# Patient Record
Sex: Male | Born: 1960
Health system: Southern US, Community
[De-identification: ages and names within clinical notes are randomized; demographics above are authoritative.]

## PROBLEM LIST (undated history)

## (undated) ENCOUNTER — Emergency Department (HOSPITAL_COMMUNITY): Disposition: A | Discharge status: Home / Self Care | Payer: No Typology Code available for payment source

## (undated) DIAGNOSIS — I078 Other rheumatic tricuspid valve diseases: Secondary | ICD-10-CM

## (undated) DIAGNOSIS — Z9289 Personal history of other medical treatment: Secondary | ICD-10-CM

## (undated) DIAGNOSIS — M109 Gout, unspecified: Secondary | ICD-10-CM

## (undated) DIAGNOSIS — G4730 Sleep apnea, unspecified: Secondary | ICD-10-CM

## (undated) DIAGNOSIS — I340 Nonrheumatic mitral (valve) insufficiency: Secondary | ICD-10-CM

## (undated) DIAGNOSIS — I5042 Chronic combined systolic (congestive) and diastolic (congestive) heart failure: Secondary | ICD-10-CM

## (undated) DIAGNOSIS — I420 Dilated cardiomyopathy: Secondary | ICD-10-CM

## (undated) DIAGNOSIS — I10 Essential (primary) hypertension: Secondary | ICD-10-CM

## (undated) DIAGNOSIS — I493 Ventricular premature depolarization: Secondary | ICD-10-CM

## (undated) DIAGNOSIS — M1990 Unspecified osteoarthritis, unspecified site: Secondary | ICD-10-CM

## (undated) HISTORY — DX: Dilated cardiomyopathy: I42.0

## (undated) HISTORY — DX: Chronic combined systolic (congestive) and diastolic (congestive) heart failure: I50.42

## (undated) HISTORY — DX: Sleep apnea, unspecified: G47.30

## (undated) HISTORY — DX: Essential (primary) hypertension: I10

## (undated) HISTORY — DX: Ventricular premature depolarization: I49.3

## (undated) HISTORY — PX: CARDIAC VALVE REPLACEMENT: SHX585

## (undated) HISTORY — PX: ABDOMINAL SURGERY: SHX537

## (undated) HISTORY — DX: Personal history of other medical treatment: Z92.89

## (undated) HISTORY — PX: CARDIAC CATHETERIZATION: SHX172

---

## 1998-05-16 ENCOUNTER — Emergency Department (HOSPITAL_COMMUNITY): Admission: EM | Admit: 1998-05-16 | Discharge: 1998-05-16 | Payer: Self-pay | Admitting: Emergency Medicine

## 1999-01-26 ENCOUNTER — Emergency Department (HOSPITAL_COMMUNITY): Admission: EM | Admit: 1999-01-26 | Discharge: 1999-01-26 | Payer: Self-pay | Admitting: Internal Medicine

## 2004-02-10 ENCOUNTER — Emergency Department (HOSPITAL_COMMUNITY): Admission: EM | Admit: 2004-02-10 | Discharge: 2004-02-10 | Payer: Self-pay | Admitting: Emergency Medicine

## 2004-07-25 ENCOUNTER — Emergency Department (HOSPITAL_COMMUNITY): Admission: EM | Admit: 2004-07-25 | Discharge: 2004-07-25 | Payer: Self-pay | Admitting: Emergency Medicine

## 2004-08-08 ENCOUNTER — Ambulatory Visit: Payer: Self-pay | Admitting: Family Medicine

## 2004-12-10 ENCOUNTER — Ambulatory Visit: Payer: Self-pay | Admitting: Family Medicine

## 2005-10-27 ENCOUNTER — Emergency Department (HOSPITAL_COMMUNITY): Admission: EM | Admit: 2005-10-27 | Discharge: 2005-10-27 | Payer: Self-pay | Admitting: Family Medicine

## 2007-11-13 ENCOUNTER — Ambulatory Visit: Payer: Self-pay | Admitting: Surgery

## 2007-11-13 ENCOUNTER — Encounter (INDEPENDENT_AMBULATORY_CARE_PROVIDER_SITE_OTHER): Payer: Self-pay | Admitting: Family Medicine

## 2007-11-13 ENCOUNTER — Ambulatory Visit (HOSPITAL_COMMUNITY): Admission: RE | Admit: 2007-11-13 | Discharge: 2007-11-13 | Payer: Self-pay | Admitting: Internal Medicine

## 2007-11-13 ENCOUNTER — Ambulatory Visit: Payer: Self-pay | Admitting: *Deleted

## 2007-11-13 ENCOUNTER — Ambulatory Visit: Payer: Self-pay | Admitting: Internal Medicine

## 2007-11-13 ENCOUNTER — Encounter: Payer: Self-pay | Admitting: Family Medicine

## 2007-11-13 LAB — CONVERTED CEMR LAB
ALT: 22 units/L (ref 0–53)
AST: 20 units/L (ref 0–37)
Albumin: 4.5 g/dL (ref 3.5–5.2)
Alkaline Phosphatase: 101 units/L (ref 39–117)
BUN: 18 mg/dL (ref 6–23)
Basophils Absolute: 0 10*3/uL (ref 0.0–0.1)
Basophils Relative: 0 % (ref 0–1)
CO2: 20 meq/L (ref 19–32)
Calcium: 8.8 mg/dL (ref 8.4–10.5)
Chloride: 104 meq/L (ref 96–112)
Cholesterol: 164 mg/dL (ref 0–200)
Creatinine, Ser: 1.19 mg/dL (ref 0.40–1.50)
Eosinophils Absolute: 0.4 10*3/uL (ref 0.0–0.7)
Eosinophils Relative: 9 % — ABNORMAL HIGH (ref 0–5)
Glucose, Bld: 73 mg/dL (ref 70–99)
HCT: 43.2 % (ref 39.0–52.0)
HDL: 50 mg/dL (ref >39)
Hemoglobin: 14.3 g/dL (ref 13.0–17.0)
LDL Cholesterol: 95 mg/dL (ref 0–99)
Lymphocytes Relative: 35 % (ref 12–46)
Lymphs Abs: 1.7 10*3/uL (ref 0.7–4.0)
MCHC: 33.1 g/dL (ref 30.0–36.0)
MCV: 93.1 fL (ref 78.0–100.0)
Monocytes Absolute: 0.4 10*3/uL (ref 0.1–1.0)
Monocytes Relative: 8 % (ref 3–12)
Neutro Abs: 2.4 10*3/uL (ref 1.7–7.7)
Neutrophils Relative %: 49 % (ref 43–77)
Platelets: 186 10*3/uL (ref 150–400)
Potassium: 4.1 meq/L (ref 3.5–5.3)
RBC: 4.64 M/uL (ref 4.22–5.81)
RDW: 13.7 % (ref 11.5–15.5)
Sodium: 141 meq/L (ref 135–145)
Total Bilirubin: 0.7 mg/dL (ref 0.3–1.2)
Total CHOL/HDL Ratio: 3.3
Total Protein: 7.5 g/dL (ref 6.0–8.3)
Triglycerides: 96 mg/dL (ref <150)
VLDL: 19 mg/dL (ref 0–40)
WBC: 5 10*3/uL (ref 4.0–10.5)

## 2007-11-17 ENCOUNTER — Ambulatory Visit: Payer: Self-pay | Admitting: Internal Medicine

## 2008-01-19 ENCOUNTER — Ambulatory Visit: Payer: Self-pay | Admitting: Internal Medicine

## 2008-01-28 ENCOUNTER — Ambulatory Visit: Payer: Self-pay | Admitting: Internal Medicine

## 2008-06-21 ENCOUNTER — Ambulatory Visit: Payer: Self-pay | Admitting: Internal Medicine

## 2008-06-21 LAB — CONVERTED CEMR LAB
ALT: 25 units/L (ref 0–53)
AST: 19 units/L (ref 0–37)
Albumin: 4.3 g/dL (ref 3.5–5.2)
Alkaline Phosphatase: 61 units/L (ref 39–117)
BUN: 12 mg/dL (ref 6–23)
Basophils Absolute: 0 10*3/uL (ref 0.0–0.1)
Basophils Relative: 0 % (ref 0–1)
CO2: 23 meq/L (ref 19–32)
Calcium: 9.5 mg/dL (ref 8.4–10.5)
Chloride: 102 meq/L (ref 96–112)
Creatinine, Ser: 1.2 mg/dL (ref 0.40–1.50)
Eosinophils Absolute: 0.2 10*3/uL (ref 0.0–0.7)
Eosinophils Relative: 5 % (ref 0–5)
Glucose, Bld: 78 mg/dL (ref 70–99)
HCT: 44.2 % (ref 39.0–52.0)
Hemoglobin: 15.1 g/dL (ref 13.0–17.0)
Lymphocytes Relative: 41 % (ref 12–46)
Lymphs Abs: 1.8 10*3/uL (ref 0.7–4.0)
MCHC: 34.2 g/dL (ref 30.0–36.0)
MCV: 93.2 fL (ref 78.0–100.0)
Microalb, Ur: 1.81 mg/dL (ref 0.00–1.89)
Monocytes Absolute: 0.5 10*3/uL (ref 0.1–1.0)
Monocytes Relative: 12 % (ref 3–12)
Neutro Abs: 1.9 10*3/uL (ref 1.7–7.7)
Neutrophils Relative %: 42 % — ABNORMAL LOW (ref 43–77)
PSA: 2.84 ng/mL (ref 0.10–4.00)
Platelets: 215 10*3/uL (ref 150–400)
Potassium: 4.8 meq/L (ref 3.5–5.3)
RBC: 4.74 M/uL (ref 4.22–5.81)
RDW: 13.3 % (ref 11.5–15.5)
Sodium: 139 meq/L (ref 135–145)
TSH: 2.712 microintl units/mL (ref 0.350–4.50)
Total Bilirubin: 0.5 mg/dL (ref 0.3–1.2)
Total Protein: 7.3 g/dL (ref 6.0–8.3)
WBC: 4.5 10*3/uL (ref 4.0–10.5)

## 2008-07-08 ENCOUNTER — Ambulatory Visit: Payer: Self-pay | Admitting: Internal Medicine

## 2008-07-22 ENCOUNTER — Ambulatory Visit: Payer: Self-pay | Admitting: Internal Medicine

## 2009-03-07 ENCOUNTER — Ambulatory Visit: Payer: Self-pay | Admitting: Internal Medicine

## 2009-06-06 ENCOUNTER — Ambulatory Visit: Payer: Self-pay | Admitting: Internal Medicine

## 2009-06-06 ENCOUNTER — Encounter (INDEPENDENT_AMBULATORY_CARE_PROVIDER_SITE_OTHER): Payer: Self-pay | Admitting: Adult Health

## 2009-06-06 LAB — CONVERTED CEMR LAB
ALT: 24 units/L (ref 0–53)
AST: 23 units/L (ref 0–37)
Albumin: 4.8 g/dL (ref 3.5–5.2)
Alkaline Phosphatase: 54 units/L (ref 39–117)
BUN: 18 mg/dL (ref 6–23)
Basophils Absolute: 0 10*3/uL (ref 0.0–0.1)
Basophils Relative: 0 % (ref 0–1)
CO2: 24 meq/L (ref 19–32)
Calcium: 9.8 mg/dL (ref 8.4–10.5)
Chloride: 102 meq/L (ref 96–112)
Cholesterol: 193 mg/dL (ref 0–200)
Creatinine, Ser: 1.35 mg/dL (ref 0.40–1.50)
Eosinophils Absolute: 0.3 10*3/uL (ref 0.0–0.7)
Eosinophils Relative: 6 % — ABNORMAL HIGH (ref 0–5)
Glucose, Bld: 88 mg/dL (ref 70–99)
HCT: 44.3 % (ref 39.0–52.0)
HDL: 55 mg/dL (ref >39)
Hemoglobin: 15.2 g/dL (ref 13.0–17.0)
LDL Cholesterol: 91 mg/dL (ref 0–99)
Lymphocytes Relative: 32 % (ref 12–46)
Lymphs Abs: 1.6 10*3/uL (ref 0.7–4.0)
MCHC: 34.3 g/dL (ref 30.0–36.0)
MCV: 91.3 fL (ref 78.0–100.0)
Monocytes Absolute: 0.5 10*3/uL (ref 0.1–1.0)
Monocytes Relative: 11 % (ref 3–12)
Neutro Abs: 2.4 10*3/uL (ref 1.7–7.7)
Neutrophils Relative %: 50 % (ref 43–77)
Platelets: 200 10*3/uL (ref 150–400)
Potassium: 4.1 meq/L (ref 3.5–5.3)
Pro B Natriuretic peptide (BNP): 37.6 pg/mL (ref 0.0–100.0)
RBC: 4.85 M/uL (ref 4.22–5.81)
RDW: 12.6 % (ref 11.5–15.5)
Sodium: 140 meq/L (ref 135–145)
Total Bilirubin: 0.6 mg/dL (ref 0.3–1.2)
Total CHOL/HDL Ratio: 3.5
Total Protein: 7.7 g/dL (ref 6.0–8.3)
Triglycerides: 237 mg/dL — ABNORMAL HIGH (ref <150)
VLDL: 47 mg/dL — ABNORMAL HIGH (ref 0–40)
WBC: 4.9 10*3/uL (ref 4.0–10.5)

## 2009-06-15 ENCOUNTER — Ambulatory Visit: Payer: Self-pay | Admitting: Internal Medicine

## 2009-09-28 ENCOUNTER — Emergency Department (HOSPITAL_COMMUNITY): Admission: EM | Admit: 2009-09-28 | Discharge: 2009-09-28 | Payer: Self-pay | Admitting: Emergency Medicine

## 2009-09-28 ENCOUNTER — Ambulatory Visit: Payer: Self-pay | Admitting: Internal Medicine

## 2009-09-28 ENCOUNTER — Encounter (INDEPENDENT_AMBULATORY_CARE_PROVIDER_SITE_OTHER): Payer: Self-pay | Admitting: Adult Health

## 2009-09-28 LAB — CONVERTED CEMR LAB: Microalb, Ur: 9.2 mg/dL — ABNORMAL HIGH (ref 0.00–1.89)

## 2009-10-19 ENCOUNTER — Ambulatory Visit: Payer: Self-pay | Admitting: Internal Medicine

## 2009-11-17 ENCOUNTER — Ambulatory Visit: Payer: Self-pay | Admitting: Internal Medicine

## 2010-12-02 LAB — DIFFERENTIAL
Basophils Absolute: 0 10*3/uL (ref 0.0–0.1)
Basophils Relative: 0 % (ref 0–1)
Eosinophils Absolute: 0.2 10*3/uL (ref 0.0–0.7)
Eosinophils Relative: 5 % (ref 0–5)
Lymphocytes Relative: 36 % (ref 12–46)
Lymphs Abs: 1.1 10*3/uL (ref 0.7–4.0)
Monocytes Absolute: 0.4 10*3/uL (ref 0.1–1.0)
Monocytes Relative: 13 % — ABNORMAL HIGH (ref 3–12)
Neutro Abs: 1.4 10*3/uL — ABNORMAL LOW (ref 1.7–7.7)
Neutrophils Relative %: 46 % (ref 43–77)

## 2010-12-02 LAB — BASIC METABOLIC PANEL
BUN: 13 mg/dL (ref 6–23)
CO2: 27 mEq/L (ref 19–32)
Calcium: 8.7 mg/dL (ref 8.4–10.5)
Chloride: 99 mEq/L (ref 96–112)
Creatinine, Ser: 1.25 mg/dL (ref 0.4–1.5)
GFR calc Af Amer: >60 mL/min (ref >60)
GFR calc non Af Amer: >60 mL/min (ref >60)
Glucose, Bld: 96 mg/dL (ref 70–99)
Potassium: 3.4 mEq/L — ABNORMAL LOW (ref 3.5–5.1)
Sodium: 138 mEq/L (ref 135–145)

## 2010-12-02 LAB — CBC
HCT: 42.9 % (ref 39.0–52.0)
Hemoglobin: 15.2 g/dL (ref 13.0–17.0)
MCHC: 35.5 g/dL (ref 30.0–36.0)
MCV: 94.3 fL (ref 78.0–100.0)
Platelets: 129 10*3/uL — ABNORMAL LOW (ref 150–400)
RBC: 4.55 MIL/uL (ref 4.22–5.81)
RDW: 12.7 % (ref 11.5–15.5)
WBC: 3.1 10*3/uL — ABNORMAL LOW (ref 4.0–10.5)

## 2011-04-17 ENCOUNTER — Other Ambulatory Visit (HOSPITAL_COMMUNITY): Payer: Self-pay | Admitting: Family Medicine

## 2011-04-17 DIAGNOSIS — R197 Diarrhea, unspecified: Secondary | ICD-10-CM

## 2011-04-18 ENCOUNTER — Ambulatory Visit (HOSPITAL_COMMUNITY)
Admission: RE | Admit: 2011-04-18 | Discharge: 2011-04-18 | Disposition: A | Discharge status: Home / Self Care | Payer: Self-pay | Source: Ambulatory Visit | Attending: Family Medicine | Admitting: Family Medicine

## 2011-04-18 DIAGNOSIS — R109 Unspecified abdominal pain: Secondary | ICD-10-CM | POA: Insufficient documentation

## 2011-04-18 DIAGNOSIS — K5730 Diverticulosis of large intestine without perforation or abscess without bleeding: Secondary | ICD-10-CM | POA: Insufficient documentation

## 2011-04-18 DIAGNOSIS — R197 Diarrhea, unspecified: Secondary | ICD-10-CM | POA: Insufficient documentation

## 2011-04-18 DIAGNOSIS — R1110 Vomiting, unspecified: Secondary | ICD-10-CM | POA: Insufficient documentation

## 2011-04-18 MED ORDER — IOHEXOL 300 MG/ML  SOLN
100.0000 mL | Freq: Once | INTRAMUSCULAR | Status: AC | PRN
  Administered 2011-04-18: 100 mL via INTRAVENOUS

## 2014-01-19 ENCOUNTER — Encounter (HOSPITAL_COMMUNITY): Payer: Self-pay | Admitting: Emergency Medicine

## 2014-01-19 ENCOUNTER — Emergency Department (HOSPITAL_COMMUNITY): Payer: No Typology Code available for payment source

## 2014-01-19 ENCOUNTER — Inpatient Hospital Stay (HOSPITAL_COMMUNITY)
Admission: EM | Admit: 2014-01-19 | Discharge: 2014-01-25 | DRG: 292 | Disposition: A | Discharge status: Home / Self Care | Payer: No Typology Code available for payment source | Attending: Internal Medicine | Admitting: Internal Medicine

## 2014-01-19 DIAGNOSIS — I5041 Acute combined systolic (congestive) and diastolic (congestive) heart failure: Secondary | ICD-10-CM

## 2014-01-19 DIAGNOSIS — R0609 Other forms of dyspnea: Secondary | ICD-10-CM | POA: Diagnosis present

## 2014-01-19 DIAGNOSIS — I340 Nonrheumatic mitral (valve) insufficiency: Secondary | ICD-10-CM | POA: Diagnosis present

## 2014-01-19 DIAGNOSIS — I078 Other rheumatic tricuspid valve diseases: Secondary | ICD-10-CM | POA: Diagnosis present

## 2014-01-19 DIAGNOSIS — R351 Nocturia: Secondary | ICD-10-CM | POA: Diagnosis present

## 2014-01-19 DIAGNOSIS — I5043 Acute on chronic combined systolic (congestive) and diastolic (congestive) heart failure: Secondary | ICD-10-CM | POA: Diagnosis present

## 2014-01-19 DIAGNOSIS — I110 Hypertensive heart disease with heart failure: Principal | ICD-10-CM | POA: Diagnosis present

## 2014-01-19 DIAGNOSIS — I059 Rheumatic mitral valve disease, unspecified: Secondary | ICD-10-CM | POA: Diagnosis present

## 2014-01-19 DIAGNOSIS — I10 Essential (primary) hypertension: Secondary | ICD-10-CM | POA: Diagnosis present

## 2014-01-19 DIAGNOSIS — R0600 Dyspnea, unspecified: Secondary | ICD-10-CM | POA: Diagnosis present

## 2014-01-19 DIAGNOSIS — I517 Cardiomegaly: Secondary | ICD-10-CM

## 2014-01-19 DIAGNOSIS — Z79899 Other long term (current) drug therapy: Secondary | ICD-10-CM

## 2014-01-19 DIAGNOSIS — I43 Cardiomyopathy in diseases classified elsewhere: Secondary | ICD-10-CM | POA: Diagnosis present

## 2014-01-19 DIAGNOSIS — R0989 Other specified symptoms and signs involving the circulatory and respiratory systems: Secondary | ICD-10-CM | POA: Diagnosis present

## 2014-01-19 DIAGNOSIS — J811 Chronic pulmonary edema: Secondary | ICD-10-CM

## 2014-01-19 DIAGNOSIS — N179 Acute kidney failure, unspecified: Secondary | ICD-10-CM | POA: Diagnosis present

## 2014-01-19 DIAGNOSIS — R519 Headache, unspecified: Secondary | ICD-10-CM | POA: Diagnosis present

## 2014-01-19 DIAGNOSIS — H538 Other visual disturbances: Secondary | ICD-10-CM | POA: Diagnosis present

## 2014-01-19 DIAGNOSIS — I509 Heart failure, unspecified: Secondary | ICD-10-CM | POA: Diagnosis present

## 2014-01-19 DIAGNOSIS — I160 Hypertensive urgency: Secondary | ICD-10-CM

## 2014-01-19 DIAGNOSIS — R9431 Abnormal electrocardiogram [ECG] [EKG]: Secondary | ICD-10-CM | POA: Diagnosis present

## 2014-01-19 DIAGNOSIS — R51 Headache: Secondary | ICD-10-CM

## 2014-01-19 HISTORY — DX: Nonrheumatic mitral (valve) insufficiency: I34.0

## 2014-01-19 HISTORY — DX: Other rheumatic tricuspid valve diseases: I07.8

## 2014-01-19 HISTORY — DX: Essential (primary) hypertension: I10

## 2014-01-19 LAB — URINALYSIS, ROUTINE W REFLEX MICROSCOPIC
Bilirubin Urine: NEGATIVE
Glucose, UA: NEGATIVE mg/dL
Hgb urine dipstick: NEGATIVE
Ketones, ur: NEGATIVE mg/dL
Leukocytes, UA: NEGATIVE
Nitrite: NEGATIVE
Protein, ur: 30 mg/dL — AB
Specific Gravity, Urine: 1.021 (ref 1.005–1.030)
Urobilinogen, UA: 0.2 mg/dL (ref 0.0–1.0)
pH: 5.5 (ref 5.0–8.0)

## 2014-01-19 LAB — URINE MICROSCOPIC-ADD ON

## 2014-01-19 LAB — CBC
HEMATOCRIT: 42.5 % (ref 39.0–52.0)
Hemoglobin: 14.9 g/dL (ref 13.0–17.0)
MCH: 31.3 pg (ref 26.0–34.0)
MCHC: 35.1 g/dL (ref 30.0–36.0)
MCV: 89.3 fL (ref 78.0–100.0)
Platelets: 174 10*3/uL (ref 150–400)
RBC: 4.76 MIL/uL (ref 4.22–5.81)
RDW: 13.6 % (ref 11.5–15.5)
WBC: 4.4 10*3/uL (ref 4.0–10.5)

## 2014-01-19 LAB — TROPONIN I: Troponin I: <0.3 ng/mL (ref <0.30)

## 2014-01-19 LAB — BASIC METABOLIC PANEL
BUN: 17 mg/dL (ref 6–23)
CALCIUM: 9.2 mg/dL (ref 8.4–10.5)
CHLORIDE: 104 meq/L (ref 96–112)
CO2: 23 meq/L (ref 19–32)
CREATININE: 1.34 mg/dL (ref 0.50–1.35)
GFR calc Af Amer: 70 mL/min — ABNORMAL LOW (ref >90)
GFR calc non Af Amer: 60 mL/min — ABNORMAL LOW (ref >90)
Glucose, Bld: 76 mg/dL (ref 70–99)
Potassium: 4.1 mEq/L (ref 3.7–5.3)
Sodium: 142 mEq/L (ref 137–147)

## 2014-01-19 LAB — I-STAT CHEM 8, ED
BUN: 16 mg/dL (ref 6–23)
Calcium, Ion: 1.19 mmol/L (ref 1.12–1.23)
Chloride: 103 mEq/L (ref 96–112)
Creatinine, Ser: 1.5 mg/dL — ABNORMAL HIGH (ref 0.50–1.35)
Glucose, Bld: 84 mg/dL (ref 70–99)
HCT: 44 % (ref 39.0–52.0)
Hemoglobin: 15 g/dL (ref 13.0–17.0)
Potassium: 3.8 mEq/L (ref 3.7–5.3)
Sodium: 142 mEq/L (ref 137–147)
TCO2: 23 mmol/L (ref 0–100)

## 2014-01-19 LAB — PRO B NATRIURETIC PEPTIDE: Pro B Natriuretic peptide (BNP): 2268 pg/mL — ABNORMAL HIGH (ref 0–125)

## 2014-01-19 MED ORDER — ACETAMINOPHEN 650 MG RE SUPP: 650.0000 mg | Freq: Four times a day (QID) | RECTAL | Status: DC | PRN

## 2014-01-19 MED ORDER — FUROSEMIDE 10 MG/ML IJ SOLN
40.0000 mg | Freq: Once | INTRAMUSCULAR | Status: AC
  Administered 2014-01-19: 40 mg via INTRAVENOUS
  Filled 2014-01-19: qty 4

## 2014-01-19 MED ORDER — ASPIRIN EC 81 MG PO TBEC
81.0000 mg | DELAYED_RELEASE_TABLET | Freq: Every day | ORAL | Status: DC
  Administered 2014-01-20 – 2014-01-25 (×6): 81 mg via ORAL
  Filled 2014-01-19 (×6): qty 1

## 2014-01-19 MED ORDER — ONDANSETRON HCL 4 MG PO TABS: 4.0000 mg | ORAL_TABLET | Freq: Four times a day (QID) | ORAL | Status: DC | PRN

## 2014-01-19 MED ORDER — LABETALOL HCL 5 MG/ML IV SOLN
20.0000 mg | Freq: Once | INTRAVENOUS | Status: AC
  Administered 2014-01-19: 20 mg via INTRAVENOUS
  Filled 2014-01-19: qty 4

## 2014-01-19 MED ORDER — ONDANSETRON HCL 4 MG/2ML IJ SOLN: 4.0000 mg | Freq: Four times a day (QID) | INTRAMUSCULAR | Status: DC | PRN

## 2014-01-19 MED ORDER — HYDRALAZINE HCL 20 MG/ML IJ SOLN
10.0000 mg | INTRAMUSCULAR | Status: DC | PRN
  Administered 2014-01-20 – 2014-01-25 (×3): 10 mg via INTRAVENOUS
  Filled 2014-01-19 (×2): qty 1
  Filled 2014-01-19: qty 0.5

## 2014-01-19 MED ORDER — HYDRALAZINE HCL 25 MG PO TABS
25.0000 mg | ORAL_TABLET | Freq: Four times a day (QID) | ORAL | Status: DC
  Administered 2014-01-19: 25 mg via ORAL
  Filled 2014-01-19 (×6): qty 1

## 2014-01-19 MED ORDER — AMLODIPINE BESYLATE 10 MG PO TABS
10.0000 mg | ORAL_TABLET | Freq: Every day | ORAL | Status: DC
  Administered 2014-01-19 – 2014-01-25 (×7): 10 mg via ORAL
  Filled 2014-01-19 (×8): qty 1

## 2014-01-19 MED ORDER — FUROSEMIDE 40 MG PO TABS
40.0000 mg | ORAL_TABLET | Freq: Every morning | ORAL | Status: DC
  Administered 2014-01-20 – 2014-01-25 (×6): 40 mg via ORAL
  Filled 2014-01-19 (×7): qty 1

## 2014-01-19 MED ORDER — HEPARIN SODIUM (PORCINE) 5000 UNIT/ML IJ SOLN
5000.0000 [IU] | Freq: Three times a day (TID) | INTRAMUSCULAR | Status: DC
  Administered 2014-01-19 – 2014-01-25 (×17): 5000 [IU] via SUBCUTANEOUS
  Filled 2014-01-19 (×21): qty 1

## 2014-01-19 MED ORDER — METOPROLOL TARTRATE 100 MG PO TABS
100.0000 mg | ORAL_TABLET | Freq: Two times a day (BID) | ORAL | Status: DC
  Administered 2014-01-19 – 2014-01-21 (×5): 100 mg via ORAL
  Filled 2014-01-19: qty 4
  Filled 2014-01-19 (×6): qty 1

## 2014-01-19 MED ORDER — SODIUM CHLORIDE 0.9 % IJ SOLN
3.0000 mL | Freq: Two times a day (BID) | INTRAMUSCULAR | Status: DC
  Administered 2014-01-19 – 2014-01-25 (×7): 3 mL via INTRAVENOUS

## 2014-01-19 MED ORDER — NITROGLYCERIN 2 % TD OINT
0.5000 [in_us] | TOPICAL_OINTMENT | Freq: Four times a day (QID) | TRANSDERMAL | Status: DC
  Administered 2014-01-19: 0.5 [in_us] via TOPICAL
  Filled 2014-01-19: qty 30

## 2014-01-19 MED ORDER — ACETAMINOPHEN 325 MG PO TABS
650.0000 mg | ORAL_TABLET | Freq: Four times a day (QID) | ORAL | Status: DC | PRN
  Administered 2014-01-20 – 2014-01-22 (×3): 650 mg via ORAL
  Filled 2014-01-19 (×3): qty 2

## 2014-01-19 MED ORDER — BUTALBITAL-APAP-CAFFEINE 50-325-40 MG PO TABS
1.0000 | ORAL_TABLET | ORAL | Status: DC | PRN
  Administered 2014-01-20 – 2014-01-21 (×4): 1 via ORAL
  Filled 2014-01-19 (×4): qty 1

## 2014-01-19 NOTE — ED Notes (Signed)
Pt states that he just used the restroom while he was in the waiting room

## 2014-01-19 NOTE — ED Notes (Signed)
Pt from [facility] via [facility]. He went there to get his BP checked.  He has been off his BP meds x 2 months, BP per EMS was 210/140. He rates his slight headache at 3/10. [facility] gave 100mg  PO of Labetalol and 0.2mg  Clonidine.

## 2014-01-19 NOTE — H&P (Signed)
[facility] History and Physical  Patient: Daniel Joseph  SWN:[id number]  DOB: 04-13-1961  DOS: the patient was seen and examined on 01/19/2014 PCP: No primary provider on file.  Chief Complaint: headache and blurred vision  HPI: Daniel Joseph is a 53 y.o. male with Past medical history of hypertension. The patient presented with complaints of shortness of breath that has been ongoing since last 2 weeks. He initially described her shortness of breath as on exertion. Since last one week he has been having complaints of orthopnea. Since last 3 days he has been having periods of PND. She denies any fever, chills, cough, chest pain, palpitation. He complains of blurring of the vision which has been occurring on and off throughout the day this last few days. At present it is resolved. He also denies any complaint no focal neurological deficit. He mentions he has history of hypertension and has been on medications but due to his insurance issues he had an out of his medications 2 months ago and has not been able to take any of them. He denies any alcohol abuse smoking or drug abuse. He mentions he watches his salt in diet. He mentions he claims 1-2 beers on a daily basis.  The patient is coming from home. And at his baseline independent for most of his ADL.  Review of Systems: as mentioned in the history of present illness.  A Comprehensive review of the other systems is negative.  Past Medical History  Diagnosis Date  . Hypertension    Past Surgical History  Procedure Laterality Date  . Abdominal surgery  at birth   Social History:  reports that he has never smoked. He has never used smokeless tobacco. He reports that he drinks alcohol. He reports that he does not use illicit drugs.  No Known Allergies  History reviewed. No pertinent family history.  Prior to Admission medications   Medication Sig Start Date End Date Taking? Authorizing Provider  amLODipine (NORVASC) 10 MG  tablet Take 10 mg by mouth daily.   Yes Historical Provider, MD  aspirin 325 MG tablet Take 325 mg by mouth daily.   Yes Historical Provider, MD  benazepril (LOTENSIN) 40 MG tablet Take 40 mg by mouth daily.   Yes Historical Provider, MD  cloNIDine (CATAPRES) 0.1 MG tablet Take 0.1 mg by mouth 2 (two) times daily.   Yes Historical Provider, MD  furosemide (LASIX) 20 MG tablet Take 20 mg by mouth every morning.   Yes Historical Provider, MD  metoprolol (LOPRESSOR) 50 MG tablet Take 100 mg by mouth 2 (two) times daily.   Yes Historical Provider, MD  potassium chloride (K-DUR,KLOR-CON) 10 MEQ tablet Take 10 mEq by mouth daily.   Yes Historical Provider, MD    Physical Exam: Filed Vitals:   01/19/14 1543 01/19/14 1841  BP: 188/138 180/130  Pulse: 92 76  Temp: 98 F (36.7 C)   TempSrc: Oral   Resp: 16 22  SpO2: 98% 98%    General: Alert, Awake and Oriented to Time, Place and Person. Appear in mild distress Eyes: PERRL ENT: Oral Mucosa clear moist. Neck: no JVD Cardiovascular: S1 and S2 Present, no Murmur, Peripheral Pulses Present Respiratory: Bilateral Air entry equal and Decreased, Clear to Auscultation,  no Crackles,no wheezes Abdomen: Bowel Sound Present, Soft and Non tender Skin: no Rash Extremities: trace Pedal edema, no calf tenderness Neurologic: Grossly no focal neuro deficit. Mental status AAOx3, speech normal, attention normal, Cranial Nerves PERRL, EOM normal and present,  facial sensation to light touch present: , Motor strength bilateral equal strength 5/5, Sensation present to light touch, reflexes present knee and biceps, babinski negative, Cerebellar test normal finger nose finger.  Labs on Admission:  CBC:  Recent Labs Lab 01/19/14 1716 01/19/14 2045  WBC  --  4.4  HGB 15.0 14.9  HCT 44.0 42.5  MCV  --  89.3  PLT  --  174    CMP     Component Value Date/Time   NA 142 01/19/2014 2045   K 4.1 01/19/2014 2045   CL 104 01/19/2014 2045   CO2 23 01/19/2014 2045    GLUCOSE 76 01/19/2014 2045   BUN 17 01/19/2014 2045   CREATININE 1.34 01/19/2014 2045   CALCIUM 9.2 01/19/2014 2045   PROT 7.7 06/06/2009 2116   ALBUMIN 4.8 06/06/2009 2116   AST 23 06/06/2009 2116   ALT 24 06/06/2009 2116   ALKPHOS 54 06/06/2009 2116   BILITOT 0.6 06/06/2009 2116   GFRNONAA 60* 01/19/2014 2045   GFRAA 70* 01/19/2014 2045    No results found for this basename: LIPASE, AMYLASE,  in the last 168 hours No results found for this basename: AMMONIA,  in the last 168 hours   Recent Labs Lab 01/19/14 1904  TROPONINI <0.30   BNP (last 3 results)  Recent Labs  01/19/14 1904  PROBNP 2268.0*    Radiological Exams on Admission: Dg Chest 2 View  01/19/2014   CLINICAL DATA:  Cough.  EXAM: CHEST  2 VIEW  COMPARISON:  07/26/2011  FINDINGS: The heart is borderline enlarged which is a new finding. There is peribronchial thickening and increased interstitial markings. Findings could be secondary to interstitial edema or bronchitis. No pleural effusion or focal infiltrate. The bony thorax is intact.  IMPRESSION: Cardiac enlargement, new since prior chest x-ray. There is also peribronchial thickening and increased interstitial markings which could reflect edema or bronchitis. No focal infiltrates or pleural effusion.   Electronically Signed   By: Kalman Jewels M.D.   On: 01/19/2014 17:46    EKG: Independently reviewed. normal sinus rhythm, nonspecific ST and T waves changes.  Assessment/Plan Principal Problem:   Accelerated hypertension Active Problems:   PND (paroxysmal nocturnal dyspnea)   Nocturia   Blurred vision   Headache   T wave inversion in EKG   Cardiomegaly   1. Accelerated hypertension The patient is presenting with significantly elevated blood pressure. His proBNP is also elevated. Chest x-ray shows minimal cardiomegaly an EKG shows T wave inversions. Troponins are negative. With that the patient will be admitted to the hospital for further workup. He will be started on  amlodipine, Lopressor, hydralazine, nitroglycerin ointment, IV hydralazine as needed. I would avoid clonidine in his case due to possibility of abrupt withdrawal and abrupt rebound hypertension.  2. Possible diastolic dysfunction Patient has received IV Lasix I would continue him on Lasix tomorrow. Echocardiogram in the morning. Serial troponin  3. Blood vision Patient does not have any focal neurological deficit on examination at present continue to monitor.  DVT Prophylaxis: subcutaneous Heparin Nutrition: cardiac diet  Code Status: full  Disposition: Admitted to observation in telemetry unit.  Author: Berle Mull, MD [facility] Pager: [phone] 01/19/2014, 10:21 PM    If 7PM-7AM, please contact night-coverage [email or web address] Password [email or web address]

## 2014-01-19 NOTE — ED Provider Notes (Signed)
CSN: [id number]     Arrival date & time 01/19/14  1531 History   First MD Initiated Contact with Patient 01/19/14 1615     Chief Complaint  Patient presents with  . Hypertension     (Consider location/radiation/quality/duration/timing/severity/associated sxs/prior Treatment) HPI Pt presenting with c/o hypertension.  Pt states that he has been feeling short of breath over the past week.  He has not been taking his BP meds over the past 2 months.  [facility], was found to be hypertensive, he was given labetalol and clonodine po and referred to the ED for further management.  No chest pain. No leg swelling.  SOB worse with exertion.  Denies PND and orthopnea.  There are no other associated systemic symptoms, there are no other alleviating or modifying factors.   Past Medical History  Diagnosis Date  . Hypertension    Past Surgical History  Procedure Laterality Date  . Abdominal surgery  at birth   History reviewed. No pertinent family history. History  Substance Use Topics  . Smoking status: Never Smoker   . Smokeless tobacco: Never Used  . Alcohol Use: Yes     Comment: social    Review of Systems ROS reviewed and all otherwise negative except for mentioned in HPI    Allergies  Review of patient's allergies indicates no known allergies.  Home Medications   Prior to Admission medications   Medication Sig Start Date End Date Taking? Authorizing Provider  amLODipine (NORVASC) 10 MG tablet Take 10 mg by mouth daily.   Yes Historical Provider, MD  aspirin 325 MG tablet Take 325 mg by mouth daily.   Yes Historical Provider, MD  benazepril (LOTENSIN) 40 MG tablet Take 40 mg by mouth daily.   Yes Historical Provider, MD  cloNIDine (CATAPRES) 0.1 MG tablet Take 0.1 mg by mouth 2 (two) times daily.   Yes Historical Provider, MD  furosemide (LASIX) 20 MG tablet Take 20 mg by mouth every morning.   Yes Historical Provider, MD  metoprolol (LOPRESSOR) 50 MG tablet  Take 100 mg by mouth 2 (two) times daily.   Yes Historical Provider, MD  potassium chloride (K-DUR,KLOR-CON) 10 MEQ tablet Take 10 mEq by mouth daily.   Yes Historical Provider, MD   BP 164/120  Pulse 67  Temp(Src) 98 F (36.7 C) (Oral)  Resp 19  SpO2 97% Vitals reviewed Physical Exam Physical Examination: General appearance - alert, well appearing, and in no distress Mental status - alert, oriented to person, place, and time Eyes - no conjunctival injection, no scleral icterus Mouth - mucous membranes moist, pharynx normal without lesions Chest - clear to auscultation, no wheezes, rales or rhonchi, symmetric air entry, mild tachypnea, no increased respiratory effort Heart - normal rate, regular rhythm, normal S1, S2, no murmurs, rubs, clicks or gallops Abdomen - soft, nontender, nondistended, no masses or organomegaly Extremities - peripheral pulses normal, no pedal edema, no clubbing or cyanosis Skin - normal coloration and turgor, no rashes  ED Course  Procedures (including critical care time) Labs Review Labs Reviewed  URINALYSIS, ROUTINE W REFLEX MICROSCOPIC - Abnormal; Notable for the following:    Protein, ur 30 (*)    All other components within normal limits  PRO B NATRIURETIC PEPTIDE - Abnormal; Notable for the following:    Pro B Natriuretic peptide (BNP) 2268.0 (*)    All other components within normal limits  BASIC METABOLIC PANEL - Abnormal; Notable for the following:    GFR  calc non Af Amer 60 (*)    GFR calc Af Amer 70 (*)    All other components within normal limits  I-STAT CHEM 8, ED - Abnormal; Notable for the following:    Creatinine, Ser 1.50 (*)    All other components within normal limits  TROPONIN I  URINE MICROSCOPIC-ADD ON  CBC  COMPREHENSIVE METABOLIC PANEL  CBC  PROTIME-INR  TROPONIN I    Imaging Review Dg Chest 2 View  01/19/2014   CLINICAL DATA:  Cough.  EXAM: CHEST  2 VIEW  COMPARISON:  07/26/2011  FINDINGS: The heart is borderline  enlarged which is a new finding. There is peribronchial thickening and increased interstitial markings. Findings could be secondary to interstitial edema or bronchitis. No pleural effusion or focal infiltrate. The bony thorax is intact.  IMPRESSION: Cardiac enlargement, new since prior chest x-ray. There is also peribronchial thickening and increased interstitial markings which could reflect edema or bronchitis. No focal infiltrates or pleural effusion.   Electronically Signed   By: Kalman Jewels M.D.   On: 01/19/2014 17:46     EKG Interpretation   Date/Time:  Wednesday Jan 19 2014 16:36:45 EDT Ventricular Rate:  73 PR Interval:  164 QRS Duration: 99 QT Interval:  428 QTC Calculation: 472 R Axis:   -24 Text Interpretation:  Sinus rhythm Left atrial enlargement Left  ventricular hypertrophy Nonspecific T abnormalities, lateral leads  Baseline wander in lead(s) V5 Since previous tracing LVH more evident,  inverted t waves laterally Confirmed by Canary Brim  MD, Zasha Belleau [phone]) on  01/19/2014 10:43:51 PM      MDM   Final diagnoses:  Hypertensive urgency  Pulmonary edema    Pt presenting with hypertension, shortness of breath.  Workup reveals LVH on EKg, new finding of pulmonary edema and cardiomegaly on CXR.  BNP elevated at > 2000. Pt treated with labetalol and lasix in the ED.  D/w [facility] for admission, will need serial troponins and echo as well as diuresis and BP control.  Pt is agreeable with this plan.     Threasa Beards, MD 01/19/14 [phone]

## 2014-01-19 NOTE — Progress Notes (Signed)
  CARE MANAGEMENT ED NOTE 01/19/2014  Patient:  Daniel Joseph, Daniel Joseph   Account Number:  [id number]  Date Initiated:  01/19/2014  Documentation initiated by:  Livia Snellen  Subjective/Objective Assessment:   Patien tpresents to Ed with elvated blood pressure.     Subjective/Objective Assessment Detail:     Action/Plan:   Action/Plan Detail:   Anticipated DC Date:       Status Recommendation to Physician:   Result of Recommendation:    Other ED Services  Consult Working [place]  CM consult  Other  PCP issues    Choice offered to / List presented to:            Status of service:  Completed, signed off  ED Comments:   ED Comments Detail:  EDCM spoke to patient at bedside.  Patient confirms he has the orange card.  Patient was a patient at [facility] before it closed.  Patient is now being seen at [facility]. Patient's copay per visit is twenty dollars.  Patient reports that since he had his orange card rewed in March, the first available appointment that could be made for him was for today.  Patient is aware his copay for his generic medications is four dollars and patient is agreeable to this copay.  Patient reports he will get his prescriptions filled at [facility].  No further EDCM needs at this time.

## 2014-01-20 DIAGNOSIS — R51 Headache: Secondary | ICD-10-CM

## 2014-01-20 DIAGNOSIS — N179 Acute kidney failure, unspecified: Secondary | ICD-10-CM | POA: Diagnosis present

## 2014-01-20 LAB — COMPREHENSIVE METABOLIC PANEL
ALBUMIN: 3.7 g/dL (ref 3.5–5.2)
ALT: 17 U/L (ref 0–53)
AST: 16 U/L (ref 0–37)
Alkaline Phosphatase: 49 U/L (ref 39–117)
BUN: 18 mg/dL (ref 6–23)
CHLORIDE: 101 meq/L (ref 96–112)
CO2: 23 meq/L (ref 19–32)
CREATININE: 1.47 mg/dL — AB (ref 0.50–1.35)
Calcium: 9.2 mg/dL (ref 8.4–10.5)
GFR calc Af Amer: 63 mL/min — ABNORMAL LOW (ref >90)
GFR, EST NON AFRICAN AMERICAN: 54 mL/min — AB (ref >90)
Glucose, Bld: 105 mg/dL — ABNORMAL HIGH (ref 70–99)
Potassium: 3.7 mEq/L (ref 3.7–5.3)
SODIUM: 138 meq/L (ref 137–147)
Total Bilirubin: 1 mg/dL (ref 0.3–1.2)
Total Protein: 6.5 g/dL (ref 6.0–8.3)

## 2014-01-20 LAB — CBC
HCT: 40.4 % (ref 39.0–52.0)
Hemoglobin: 14.5 g/dL (ref 13.0–17.0)
MCH: 31.7 pg (ref 26.0–34.0)
MCHC: 35.9 g/dL (ref 30.0–36.0)
MCV: 88.4 fL (ref 78.0–100.0)
Platelets: 173 10*3/uL (ref 150–400)
RBC: 4.57 MIL/uL (ref 4.22–5.81)
RDW: 13.4 % (ref 11.5–15.5)
WBC: 4.4 10*3/uL (ref 4.0–10.5)

## 2014-01-20 LAB — TROPONIN I: Troponin I: <0.3 ng/mL (ref <0.30)

## 2014-01-20 LAB — PROTIME-INR
INR: 1.13 (ref 0.00–1.49)
PROTHROMBIN TIME: 14.3 s (ref 11.6–15.2)

## 2014-01-20 LAB — MRSA PCR SCREENING: MRSA BY PCR: NEGATIVE

## 2014-01-20 MED ORDER — NITROGLYCERIN IN D5W 200-5 MCG/ML-% IV SOLN
2.0000 ug/min | INTRAVENOUS | Status: DC
  Administered 2014-01-20: 10 ug/min via INTRAVENOUS
  Administered 2014-01-21: 30 ug/min via INTRAVENOUS
  Administered 2014-01-22: 25 ug/min via INTRAVENOUS
  Administered 2014-01-23: 20 ug/min via INTRAVENOUS
  Administered 2014-01-23: 15 ug/min via INTRAVENOUS
  Administered 2014-01-23: 10 ug/min via INTRAVENOUS
  Filled 2014-01-20 (×3): qty 250

## 2014-01-20 MED ORDER — HYDRALAZINE HCL 50 MG PO TABS
50.0000 mg | ORAL_TABLET | Freq: Four times a day (QID) | ORAL | Status: DC
  Administered 2014-01-20 – 2014-01-21 (×5): 50 mg via ORAL
  Filled 2014-01-20 (×9): qty 1

## 2014-01-20 NOTE — Progress Notes (Signed)
Echocardiogram 2D Echocardiogram has been performed.  Daniel Joseph 01/20/2014, 9:40 AM

## 2014-01-20 NOTE — Progress Notes (Signed)
Progress Note   Daniel Joseph HLK:[id number] DOB: 1961-02-21 DOA: 01/19/2014 PCP: No primary provider on file.   Brief Narrative:   Daniel Joseph is an 53 y.o. male with PMH of hypertension treated with 5 antihypertensive medications but who has been unable to obtain his medications for 2 months secondary to insurance issues, who was admitted 01/20/14 with a chief complaint of headache, dyspnea, blurred vision as well as orthopnea and paroxysmal nocturnal dyspnea. Upon initial valuation in the ER, he was found to have a pro BNP of 2268, a chest x-ray significant for cardiac enlargement with possible pulmonary edema, and EKG significant for nonspecific ST and T-wave changes, and a blood pressure of 188/138.  Assessment/Plan:   Principal Problem:   Accelerated hypertension with acute kidney injury  The patient was admitted to the SDU, and placed on Norvasc, Lasix, metoprolol, a nitroglycerin drip, and when necessary hydralazine.  Blood pressure still elevated.  Patient has evidence of end organ damage with acute kidney injury. Monitor renal function closely. Active Problems:   PND (paroxysmal nocturnal dyspnea)  Suspect CHF. Followup echocardiogram. Diurese as tolerated.   Blurred vision  Likely reflective of blood pressure elevation.   Headache  Likely reflective of accelerated hypertension. Treat symptomatically and continue efforts at lowering blood pressure.   T wave inversion in EKG  Likely from cardiac strain in the setting of accelerated hypertension.  Troponins negative x2.  Continue aspirin.   Cardiomegaly  Likely LV hypertrophy from uncontrolled hypertension.  2-D echocardiogram planned.   DVT Prophylaxis  Continue heparin.   Code Status: Full. Family Communication: No family present at the bedside. Disposition Plan: Home when stable.   IV Access:    Peripheral IV   Procedures:    2-D echocardiogram   Medical Consultants:     None.   Other Consultants:    None.   Anti-Infectives:    None.  Subjective:    Daniel Joseph says his headache has improved. Denies chest pain or tightness. No dyspnea. No nausea or vomiting. Blurry vision better.  Objective:    Filed Vitals:   01/20/14 0545 01/20/14 0554 01/20/14 0630 01/20/14 0645  BP: 173/113 171/112 173/113 165/104  Pulse:      Temp:      TempSrc:      Resp: 21 23 21 19   Height:      Weight:      SpO2: 97% 97% 95% 93%    Intake/Output Summary (Last 24 hours) at 01/20/14 0708 Last data filed at 01/20/14 0630  Gross per 24 hour  Intake    278 ml  Output    500 ml  Net   -222 ml    Exam: Gen:  NAD Cardiovascular:  RRR, +S4 gallop Respiratory:  Lungs CTAB Gastrointestinal:  Abdomen soft, NT/ND, + BS Extremities:  No C/E/C   Data Reviewed:    Labs: Basic Metabolic Panel:  Recent Labs Lab 01/19/14 1716 01/19/14 2045 01/20/14 0048  NA 142 142 138  K 3.8 4.1 3.7  CL 103 104 101  CO2  --  23 23  GLUCOSE 84 76 105*  BUN 16 17 18   CREATININE 1.50* 1.34 1.47*  CALCIUM  --  9.2 9.2   GFR Estimated Creatinine Clearance: 75.8 ml/min (by C-G formula based on Cr of 1.47). Liver Function Tests:  Recent Labs Lab 01/20/14 0048  AST 16  ALT 17  ALKPHOS 49  BILITOT 1.0  PROT 6.5  ALBUMIN 3.7  Coagulation profile  Recent Labs Lab 01/20/14 0048  INR 1.13    CBC:  Recent Labs Lab 01/19/14 1716 01/19/14 2045 01/20/14 0048  WBC  --  4.4 4.4  HGB 15.0 14.9 14.5  HCT 44.0 42.5 40.4  MCV  --  89.3 88.4  PLT  --  174 173   Cardiac Enzymes:  Recent Labs Lab 01/19/14 1904 01/20/14 0048  TROPONINI <0.30 <0.30   BNP (last 3 results)  Recent Labs  01/19/14 1904  PROBNP 2268.0*   Sepsis Labs:  Recent Labs Lab 01/19/14 2045 01/20/14 0048  WBC 4.4 4.4   Microbiology Recent Results (from the past 240 hour(s))  MRSA PCR SCREENING     Status: None   Collection Time    01/20/14 12:19 AM       Result Value Ref Range Status   MRSA by PCR NEGATIVE  NEGATIVE Final   Comment:            The GeneXpert MRSA Assay (FDA     approved for NASAL specimens     only), is one component of a     comprehensive MRSA colonization     surveillance program. It is not     intended to diagnose MRSA     infection nor to guide or     monitor treatment for     MRSA infections.     Radiographs/Studies:   Dg Chest 2 View  01/19/2014   CLINICAL DATA:  Cough.  EXAM: CHEST  2 VIEW  COMPARISON:  07/26/2011  FINDINGS: The heart is borderline enlarged which is a new finding. There is peribronchial thickening and increased interstitial markings. Findings could be secondary to interstitial edema or bronchitis. No pleural effusion or focal infiltrate. The bony thorax is intact.  IMPRESSION: Cardiac enlargement, new since prior chest x-ray. There is also peribronchial thickening and increased interstitial markings which could reflect edema or bronchitis. No focal infiltrates or pleural effusion.   Electronically Signed   By: Kalman Jewels M.D.   On: 01/19/2014 17:46    Medications:   . amLODipine  10 mg Oral Daily  . aspirin EC  81 mg Oral Daily  . furosemide  40 mg Oral q morning - 10a  . heparin  5,000 Units Subcutaneous 3 times per day  . hydrALAZINE  50 mg Oral 4 times per day  . metoprolol  100 mg Oral BID  . sodium chloride  3 mL Intravenous Q12H   Continuous Infusions: . nitroGLYCERIN 30 mcg/min (01/20/14 0630)    Time spent: 35 minutes with > 50% of time discussing current diagnostic test results, clinical impression and plan of care.    LOS: 1 day   [place]  [facility] Pager [phone]. If unable to reach me by pager, please call my cell phone at [phone].  *Please refer to [email or web address], password [email or web address] to get updated schedule on who will round on this patient, as hospitalists switch teams weekly. If 7PM-7AM, please contact night-coverage at [email or web address], password [email or web address] for any  overnight needs.  01/20/2014, 7:08 AM    **Disclaimer: This note was dictated with voice recognition software. Similar sounding words can inadvertently be transcribed and this note may contain transcription errors which may not have been corrected upon publication of note.**

## 2014-01-20 NOTE — Progress Notes (Signed)
Brochure concerning use of the [facility] center given and discussed with patient.  Jestin Burbach,RN,BSN,CCM

## 2014-01-20 NOTE — Progress Notes (Signed)
UR completed. Patient changed to inpatient- requiring IV NTG gtt.

## 2014-01-21 ENCOUNTER — Encounter (HOSPITAL_COMMUNITY): Payer: Self-pay | Admitting: Internal Medicine

## 2014-01-21 DIAGNOSIS — I5043 Acute on chronic combined systolic (congestive) and diastolic (congestive) heart failure: Secondary | ICD-10-CM

## 2014-01-21 DIAGNOSIS — I059 Rheumatic mitral valve disease, unspecified: Secondary | ICD-10-CM

## 2014-01-21 DIAGNOSIS — I5042 Chronic combined systolic (congestive) and diastolic (congestive) heart failure: Secondary | ICD-10-CM

## 2014-01-21 DIAGNOSIS — I340 Nonrheumatic mitral (valve) insufficiency: Secondary | ICD-10-CM

## 2014-01-21 DIAGNOSIS — I509 Heart failure, unspecified: Secondary | ICD-10-CM

## 2014-01-21 DIAGNOSIS — I078 Other rheumatic tricuspid valve diseases: Secondary | ICD-10-CM

## 2014-01-21 DIAGNOSIS — I10 Essential (primary) hypertension: Secondary | ICD-10-CM

## 2014-01-21 DIAGNOSIS — N179 Acute kidney failure, unspecified: Secondary | ICD-10-CM

## 2014-01-21 DIAGNOSIS — I369 Nonrheumatic tricuspid valve disorder, unspecified: Secondary | ICD-10-CM

## 2014-01-21 HISTORY — DX: Nonrheumatic mitral (valve) insufficiency: I34.0

## 2014-01-21 HISTORY — DX: Other rheumatic tricuspid valve diseases: I07.8

## 2014-01-21 HISTORY — DX: Chronic combined systolic (congestive) and diastolic (congestive) heart failure: I50.42

## 2014-01-21 LAB — LIPID PANEL
CHOL/HDL RATIO: 3.4 ratio
Cholesterol: 139 mg/dL (ref 0–200)
HDL: 41 mg/dL (ref >39)
LDL Cholesterol: 85 mg/dL (ref 0–99)
Triglycerides: 63 mg/dL (ref <150)
VLDL: 13 mg/dL (ref 0–40)

## 2014-01-21 LAB — BASIC METABOLIC PANEL
BUN: 13 mg/dL (ref 6–23)
CALCIUM: 9.2 mg/dL (ref 8.4–10.5)
CO2: 22 mEq/L (ref 19–32)
Chloride: 100 mEq/L (ref 96–112)
Creatinine, Ser: 1.32 mg/dL (ref 0.50–1.35)
GFR, EST AFRICAN AMERICAN: 71 mL/min — AB (ref >90)
GFR, EST NON AFRICAN AMERICAN: 61 mL/min — AB (ref >90)
GLUCOSE: 99 mg/dL (ref 70–99)
POTASSIUM: 3.9 meq/L (ref 3.7–5.3)
Sodium: 135 mEq/L — ABNORMAL LOW (ref 137–147)

## 2014-01-21 LAB — SEDIMENTATION RATE: Sed Rate: 0 mm/hr (ref 0–16)

## 2014-01-21 MED ORDER — HYDRALAZINE HCL 50 MG PO TABS
75.0000 mg | ORAL_TABLET | Freq: Four times a day (QID) | ORAL | Status: DC
  Administered 2014-01-21 – 2014-01-25 (×16): 75 mg via ORAL
  Filled 2014-01-21 (×21): qty 1

## 2014-01-21 MED ORDER — OXYCODONE HCL 5 MG PO TABS
5.0000 mg | ORAL_TABLET | ORAL | Status: DC | PRN
  Administered 2014-01-21 – 2014-01-22 (×4): 10 mg via ORAL
  Administered 2014-01-22: 5 mg via ORAL
  Filled 2014-01-21: qty 2
  Filled 2014-01-21: qty 1
  Filled 2014-01-21 (×3): qty 2

## 2014-01-21 NOTE — Consult Note (Addendum)
Admit date: 01/19/2014 Referring Physician  Dr. Rockne Menghini Primary Physician  NONE Primary Cardiologist  NONE Reason for Consultation  Acute CHF and TV mass  HPI:  Daniel Joseph is an 53 y.o. male with PMH of hypertension treated with 5 antihypertensive medications but who has been unable to obtain his medications for 2 months secondary to insurance issues, who was admitted 01/20/14 with a chief complaint of headache, dyspnea, blurred vision as well as orthopnea and paroxysmal nocturnal dyspnea. Upon initial valuation in the ER, he was found to have a pro BNP of 2268, a chest x-ray significant for cardiac enlargement with possible pulmonary edema, and EKG significant for nonspecific ST and T-wave changes, and a blood pressure of 188/138. 2-D echo, done 01/20/14 showed acute systolic and diastolic CHF with an EF of 32-44%, grade 2 diastolic dysfunction, mild to moderate MR and an abnormality on the tricuspid valve concerning for mass or vegetation.  He was placed on Norvasc, Lasix IV, metoprolol and NTG gtt.  Cardiology is now asked to consult.The patient presented with complaints of shortness of breath that has been ongoing since last 2 weeks. He initially described his shortness of breath as on exertion. Since last one week he has been having complaints of orthopnea. Since last 3 days he has been having periods of PND.  He denied illicit drug use and drinks 1-2 beers daily.  Cardiac enzymes thus far are normal.      PMH:   Past Medical History  Diagnosis Date  . Hypertension   . Mitral valve regurgitation 01/21/2014  . Systolic and diastolic CHF, acute 0/09/270  . 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 Abdominal surgery  at birth    Allergies:  Review of patient's allergies indicates no known allergies. Prior to Admit Meds:   Prescriptions prior to admission  Medication Sig Dispense Refill  . amLODipine (NORVASC) 10 MG tablet Take 10 mg by  mouth daily.      Marland Kitchen aspirin 325 MG tablet Take 325 mg by mouth daily.      . benazepril (LOTENSIN) 40 MG tablet Take 40 mg by mouth daily.      . cloNIDine (CATAPRES) 0.1 MG tablet Take 0.1 mg by mouth 2 (two) times daily.      . furosemide (LASIX) 20 MG tablet Take 20 mg by mouth every morning.      . metoprolol (LOPRESSOR) 50 MG tablet Take 100 mg by mouth 2 (two) times daily.      . potassium chloride (K-DUR,KLOR-CON) 10 MEQ tablet Take 10 mEq by mouth daily.       Fam HX:   History reviewed. No pertinent family history. Social HX:    History   Social History  . Marital Status: Single    Spouse Name: N/A    Number of Children: N/A  . Years of Education: N/A   Occupational History  . Not on file.   Social History Main Topics  . Smoking status: Never Smoker   . Smokeless tobacco: Never Used  . Alcohol Use: Yes     Comment: social  . Drug Use: No  . Sexual Activity: Not on file   Other Topics Concern  . Not on file   Social History Narrative  . No narrative on file     ROS:  All 11 ROS were addressed and are negative except what is stated in the HPI  Physical Exam:  Blood pressure 154/97, pulse 66, temperature 98.7 F (37.1 C), temperature source Oral, resp. rate 20, height 6\' 5"  (1.956 m), weight 224 lb 3.3 oz (101.7 kg), SpO2 97.00%.    General: Well developed, well nourished, in no acute distress Head: Eyes PERRLA, No xanthomas.   Normal cephalic and atramatic  Lungs:  Few crackles at bases Heart:   HRRR S1 S2 Pulses are 2+ & equal.            No carotid bruit. No JVD.  No abdominal bruits. No femoral bruits. Abdomen: Bowel sounds are positive, abdomen soft and non-tender without masses  Extremities:   No clubbing, cyanosis or edema.  DP +1 Neuro: Alert and oriented X 3. Psych:  Good affect, responds appropriately    Labs:   Lab Results  Component Value Date   WBC 4.4 01/20/2014   HGB 14.5 01/20/2014   HCT 40.4 01/20/2014   MCV 88.4 01/20/2014   PLT 173  01/20/2014    Recent Labs Lab 01/20/14 0048  NA 138  K 3.7  CL 101  CO2 23  BUN 18  CREATININE 1.47*  CALCIUM 9.2  PROT 6.5  BILITOT 1.0  ALKPHOS 49  ALT 17  AST 16  GLUCOSE 105*   No results found for this basename: PTT   Lab Results  Component Value Date   INR 1.13 01/20/2014   Lab Results  Component Value Date   TROPONINI <0.30 01/20/2014     Lab Results  Component Value Date   CHOL 193 06/06/2009   CHOL 164 11/13/2007   Lab Results  Component Value Date   HDL 55 06/06/2009   HDL 50 11/13/2007   Lab Results  Component Value Date   LDLCALC 91 06/06/2009   [place] 95 11/13/2007   Lab Results  Component Value Date   TRIG 237* 06/06/2009   TRIG 96 11/13/2007   Lab Results  Component Value Date   CHOLHDL 3.5 Ratio 06/06/2009   CHOLHDL 3.3 Ratio 11/13/2007   No results found for this basename: LDLDIRECT      Radiology:  Dg Chest 2 View  01/19/2014   CLINICAL DATA:  Cough.  EXAM: CHEST  2 VIEW  COMPARISON:  07/26/2011  FINDINGS: The heart is borderline enlarged which is a new finding. There is peribronchial thickening and increased interstitial markings. Findings could be secondary to interstitial edema or bronchitis. No pleural effusion or focal infiltrate. The bony thorax is intact.  IMPRESSION: Cardiac enlargement, new since prior chest x-ray. There is also peribronchial thickening and increased interstitial markings which could reflect edema or bronchitis. No focal infiltrates or pleural effusion.   Electronically Signed   By: Kalman Jewels M.D.   On: 01/19/2014 17:46    EKG:  NSR with marked LVH with T wave abnormality in the lateral percordial leads most likely secondary to repol abnormality.  ASSESSMENT:  1.  Acute combined systolic/diastolic CHF secondary.  He has moderate LV dysfunction on echo which may be due to accelerated hypertension and hypertensive CM. He has diuresed 1.2L thus far.  Weight has not changed thus far. 2.  Dilated CM ? Hypertensive - he ran  out of meds several months ago and was on 5 different ones for HTN.  He has marked LVH on EKG although not really appreciated on echo and diastolic dysfunction on echo. 3.  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  He has no history of IV drug abuse 4.  Hypertensive urgency - BP still  elevated 5.  AKI secondary to HTN  PLAN:   1.  Continue with IV diuretics 2.  Continue amlodipine/metoprolol 3.  Increase Hydralazine to 75mg  q6 hours 4.  No ACE or ARB currently due to AKI 5.  Wean off NTG gtt and add Imdur 30mg  daily once BP better controlled 6.  Switch to Bidil at discharge to decrease number of meds he has to take 7.  Will need TEE to assess TV further but needs to get BP under better control.  Will plan TEE for Monday 8.  Ultimately will need an ischemic eval most likely with a noninvasive nuclear stress test if troponin remains normal 9.  Blood cultures x 2 and sed rate - he has normal WBC and no fever and no history of IVDA so unlikely to be endocarditis  Sueanne Margarita, MD  01/21/2014  9:40 AM

## 2014-01-21 NOTE — Progress Notes (Signed)
Progress Note   Daniel Joseph [id number]:[id number] DOB: [birth year] DOA: 01/19/2014 PCP: No primary provider on file.   Brief Narrative:   Daniel Joseph is an 53 y.o. male with PMH of hypertension treated with 5 antihypertensive medications but who has been unable to obtain his medications for 2 months secondary to insurance issues, who was admitted 01/20/14 with a chief complaint of headache, dyspnea, blurred vision as well as orthopnea and paroxysmal nocturnal dyspnea. Upon initial valuation in the ER, he was found to have a pro BNP of 2268, a chest x-ray significant for cardiac enlargement with possible pulmonary edema, and EKG significant for nonspecific ST and T-wave changes, and a blood pressure of 188/138.  2-D echo, done 01/20/14 showed acute systolic and diastolic CHF with an EF of 41-32%, grade 2 diastolic dysfunction, and an abnormality on the tricuspid valve concerning for mass or vegetation. Cardiology consulted 01/21/14.  Assessment/Plan:   Principal Problem:   Accelerated hypertension with acute kidney injury  The patient was admitted to the SDU, and placed on Norvasc, Lasix, metoprolol, a nitroglycerin drip, and when necessary hydralazine.  Blood pressure still elevated.  Patient has evidence of end organ damage with acute kidney injury. Monitor renal function closely. Active Problems:   Mild-moderate mitral valve regurgitation  Likely secondary to left ventricular dilation in the setting of CHF.   Acute systolic and diastolic CHF / Cardiomegaly / PND  2-D echo shows EF 35-40%, moderate diffuse hypokinesis, and grade 2 diastolic dysfunction.  We'll need to start ACE inhibitor/ARB when renal function stable.  [facility] consult cardiology as patient will likely need heart cath.   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  Cardiology consultation requested.   Blurred vision  Likely reflective of blood pressure elevation. Resolved.   Headache  Likely  reflective of accelerated hypertension or nitroglycerin. Treat symptomatically and continue efforts at lowering blood pressure.   T wave inversion in EKG  Likely from cardiac strain in the setting of accelerated hypertension.  Troponins negative x2.  Continue aspirin.   DVT Prophylaxis  Continue heparin.   Code Status: Full. Family Communication: No family present at the bedside. Disposition Plan: Home when stable.   IV Access:    Peripheral IV   Procedures:   2-D echocardiogram 01/20/14: EF 35-40% with moderate diffuse hypokinesis and possible hypokinesis of the anteroseptal myocardium. Grade 2 diastolic dysfunction. Mild-moderate mitral valve regurgitation. Nonmobile echodense mass on the posterior leaflet of the tricuspid valve consistent with vegetation or tumor.   Medical Consultants:    Cardiology.   Other Consultants:    None.   Anti-Infectives:    None.  Subjective:   DANNIEL TONES continues to report headache. Denies chest pain or tightness. No dyspnea. No nausea or vomiting.  Objective:    Filed Vitals:   01/21/14 0217 01/21/14 0500 01/21/14 0600 01/21/14 0700  BP: 157/107  166/121 154/97  Pulse:      Temp:      TempSrc:      Resp: 15  16 20   Height:      Weight:  101.7 kg (224 lb 3.3 oz)    SpO2: 98%  99% 97%    Intake/Output Summary (Last 24 hours) at 01/21/14 0859 Last data filed at 01/21/14 0740  Gross per 24 hour  Intake  557.3 ml  Output   1300 ml  Net -742.7 ml    Exam: Gen:  NAD Cardiovascular:  RRR, +S4 gallop Respiratory:  Lungs CTAB  Gastrointestinal:  Abdomen soft, NT/ND, + BS Extremities:  No C/E/C   Data Reviewed:    Labs: Basic Metabolic Panel:  Recent Labs Lab 01/19/14 1716 01/19/14 2045 01/20/14 0048  NA 142 142 138  K 3.8 4.1 3.7  CL 103 104 101  CO2  --  23 23  GLUCOSE 84 76 105*  BUN 16 17 18   CREATININE 1.50* 1.34 1.47*  CALCIUM  --  9.2 9.2   GFR Estimated Creatinine Clearance: 75.8  ml/min (by C-G formula based on Cr of 1.47). Liver Function Tests:  Recent Labs Lab 01/20/14 0048  AST 16  ALT 17  ALKPHOS 49  BILITOT 1.0  PROT 6.5  ALBUMIN 3.7   Coagulation profile  Recent Labs Lab 01/20/14 0048  INR 1.13    CBC:  Recent Labs Lab 01/19/14 1716 01/19/14 2045 01/20/14 0048  WBC  --  4.4 4.4  HGB 15.0 14.9 14.5  HCT 44.0 42.5 40.4  MCV  --  89.3 88.4  PLT  --  174 173   Cardiac Enzymes:  Recent Labs Lab 01/19/14 1904 01/20/14 0048  TROPONINI <0.30 <0.30   BNP (last 3 results)  Recent Labs  01/19/14 1904  PROBNP 2268.0*   Sepsis Labs:  Recent Labs Lab 01/19/14 2045 01/20/14 0048  WBC 4.4 4.4   Microbiology Recent Results (from the past 240 hour(s))  MRSA PCR SCREENING     Status: None   Collection Time    01/20/14 12:19 AM      Result Value Ref Range Status   MRSA by PCR NEGATIVE  NEGATIVE Final   Comment:            The GeneXpert MRSA Assay (FDA     approved for NASAL specimens     only), is one component of a     comprehensive MRSA colonization     surveillance program. It is not     intended to diagnose MRSA     infection nor to guide or     monitor treatment for     MRSA infections.     Radiographs/Studies:   Dg Chest 2 View  01/19/2014   CLINICAL DATA:  Cough.  EXAM: CHEST  2 VIEW  COMPARISON:  07/26/2011  FINDINGS: The heart is borderline enlarged which is a new finding. There is peribronchial thickening and increased interstitial markings. Findings could be secondary to interstitial edema or bronchitis. No pleural effusion or focal infiltrate. The bony thorax is intact.  IMPRESSION: Cardiac enlargement, new since prior chest x-ray. There is also peribronchial thickening and increased interstitial markings which could reflect edema or bronchitis. No focal infiltrates or pleural effusion.   Electronically Signed   By: Kalman Jewels M.D.   On: 01/19/2014 17:46    Medications:   . amLODipine  10 mg Oral Daily  .  aspirin EC  81 mg Oral Daily  . furosemide  40 mg Oral q morning - 10a  . heparin  5,000 Units Subcutaneous 3 times per day  . hydrALAZINE  50 mg Oral 4 times per day  . metoprolol  100 mg Oral BID  . sodium chloride  3 mL Intravenous Q12H   Continuous Infusions: . nitroGLYCERIN 30 mcg/min (01/20/14 2135)    Time spent: 35 minutes with > 50% of time discussing current diagnostic test results, clinical impression and plan of care.    LOS: 2 days   [place]  [facility] Pager [phone]. If unable to reach me by  pager, please call my cell phone at [phone].  *Please refer to [email or web address], password [email or web address] to get updated schedule on who will round on this patient, as hospitalists switch teams weekly. If 7PM-7AM, please contact night-coverage at [email or web address], password [email or web address] for any overnight needs.  01/21/2014, 8:59 AM    **Disclaimer: This note was dictated with voice recognition software. Similar sounding words can inadvertently be transcribed and this note may contain transcription errors which may not have been corrected upon publication of note.**

## 2014-01-21 NOTE — Progress Notes (Signed)
TEE arranged for Dr. Stanford Breed on Monday, 05/11 at 9:00 am at [facility]. The Endo staff will arrange transportation to [facility]. Their phone number is [phone] if there are any questions.  Lonn Georgia, PA-C 01/21/2014 10:28 AM Beeper [phone]

## 2014-01-22 LAB — BASIC METABOLIC PANEL
BUN: 15 mg/dL (ref 6–23)
CALCIUM: 8.9 mg/dL (ref 8.4–10.5)
CO2: 24 meq/L (ref 19–32)
CREATININE: 1.53 mg/dL — AB (ref 0.50–1.35)
Chloride: 98 mEq/L (ref 96–112)
GFR calc Af Amer: 60 mL/min — ABNORMAL LOW (ref >90)
GFR calc non Af Amer: 51 mL/min — ABNORMAL LOW (ref >90)
Glucose, Bld: 93 mg/dL (ref 70–99)
Potassium: 3.9 mEq/L (ref 3.7–5.3)
Sodium: 134 mEq/L — ABNORMAL LOW (ref 137–147)

## 2014-01-22 MED ORDER — METOPROLOL TARTRATE 50 MG PO TABS
150.0000 mg | ORAL_TABLET | Freq: Two times a day (BID) | ORAL | Status: DC
  Administered 2014-01-22 – 2014-01-25 (×7): 150 mg via ORAL
  Filled 2014-01-22 (×9): qty 1

## 2014-01-22 NOTE — Progress Notes (Signed)
Patient ID: Daniel Joseph, male   DOB: [birth year], 53 y.o.   MRN: [id number]  See Dr. Theodosia Blender cardiology consult note from yesterday.  [name], MD

## 2014-01-22 NOTE — Progress Notes (Signed)
Progress Note   Daniel Joseph YQM:[id number] DOB: 16-Oct-1960 DOA: 01/19/2014 PCP: No primary provider on file.   Brief Narrative:   Daniel Joseph is an 53 y.o. male with PMH of hypertension treated with 5 antihypertensive medications but who has been unable to obtain his medications for 2 months secondary to insurance issues, who was admitted 01/20/14 with a chief complaint of headache, dyspnea, blurred vision as well as orthopnea and paroxysmal nocturnal dyspnea. Upon initial valuation in the ER, he was found to have a pro BNP of 2268, a chest x-ray significant for cardiac enlargement with possible pulmonary edema, and EKG significant for nonspecific ST and T-wave changes, and a blood pressure of 188/138.  2-D echo, done 01/20/14 showed acute systolic and diastolic CHF with an EF of 52-84%, grade 2 diastolic dysfunction, and an abnormality on the tricuspid valve concerning for mass or vegetation. Cardiology consulted 01/21/14.  Assessment/Plan:   Principal Problem:   Accelerated hypertension with acute kidney injury  The patient was admitted to the SDU, and placed on Norvasc, Lasix, metoprolol, a nitroglycerin drip, and when necessary hydralazine. Antihypertensives adjusted by cardiologist 01/21/14: Hydralazine increased with recommendations to wean off nitroglycerin drip and add Imdur 30 mg daily once blood pressure better controlled, and switch to Bidil at discharge.  Blood pressure still elevated: 165-177/106-123. Increase metoprolol to 150 mg twice a day.  Patient has evidence of end organ damage with acute kidney injury. Monitor renal function closely. Active Problems:   Mild-moderate mitral valve regurgitation  Likely secondary to left ventricular dilation in the setting of CHF.   Acute systolic and diastolic CHF / Cardiomegaly / PND  2-D echo shows EF 35-40%, moderate diffuse hypokinesis, and grade 2 diastolic dysfunction.  We'll need to start ACE inhibitor/ARB when renal  function stable. Renal function not yet stable.  Seen by cardiologist 01/21/14 with plans for a TEE 01/24/14 and a stress test versus heart catheterization.   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  TEE planned for 01/24/14 to further evaluate.   Blurred vision  Likely reflective of blood pressure elevation. Resolved.   Headache  Likely reflective of accelerated hypertension or nitroglycerin. Treat symptomatically and continue efforts at lowering blood pressure.   T wave inversion in EKG  Likely from cardiac strain in the setting of accelerated hypertension.  Troponins negative x2.  Continue aspirin.   DVT Prophylaxis  Continue heparin.   Code Status: Full. Family Communication: No family present at the bedside. Disposition Plan: Home when stable.   IV Access:    Peripheral IV   Procedures:   2-D echocardiogram 01/20/14: EF 35-40% with moderate diffuse hypokinesis and possible hypokinesis of the anteroseptal myocardium. Grade 2 diastolic dysfunction. Mild-moderate mitral valve regurgitation. Nonmobile echodense mass on the posterior leaflet of the tricuspid valve consistent with vegetation or tumor.   Medical Consultants:    Dr. Fransico Him, Cardiology.   Other Consultants:    None.   Anti-Infectives:    None.  Subjective:   Daniel Joseph continues to report headache. Continues to deny chest pain or tightness. No dyspnea. No nausea or vomiting. Appetite good.  Objective:    Filed Vitals:   01/22/14 0400 01/22/14 0500 01/22/14 0600 01/22/14 0700  BP: 165/106 171/113 177/123 175/113  Pulse:      Temp:      TempSrc:      Resp: 20 21 19 23   Height:      Weight:  101.6 kg (223 lb  15.8 oz)    SpO2: 97% 97% 95% 98%    Intake/Output Summary (Last 24 hours) at 01/22/14 0350 Last data filed at 01/22/14 0600  Gross per 24 hour  Intake 258.55 ml  Output   1400 ml  Net -1141.45 ml    Exam: Gen:  NAD Cardiovascular:  RRR, no  murmurs, rubs, or gallops Respiratory:  Lungs CTAB Gastrointestinal:  Abdomen soft, NT/ND, + BS Extremities:  No C/E/C   Data Reviewed:    Labs: Basic Metabolic Panel:  Recent Labs Lab 01/19/14 1716 01/19/14 2045 01/20/14 0048 01/21/14 0956 01/22/14 0327  NA 142 142 138 135* 134*  K 3.8 4.1 3.7 3.9 3.9  CL 103 104 101 100 98  CO2  --  23 23 22 24   GLUCOSE 84 76 105* 99 93  BUN 16 17 18 13 15   CREATININE 1.50* 1.34 1.47* 1.32 1.53*  CALCIUM  --  9.2 9.2 9.2 8.9   GFR Estimated Creatinine Clearance: 72.8 ml/min (by C-G formula based on Cr of 1.53). Liver Function Tests:  Recent Labs Lab 01/20/14 0048  AST 16  ALT 17  ALKPHOS 49  BILITOT 1.0  PROT 6.5  ALBUMIN 3.7   Coagulation profile  Recent Labs Lab 01/20/14 0048  INR 1.13    CBC:  Recent Labs Lab 01/19/14 1716 01/19/14 2045 01/20/14 0048  WBC  --  4.4 4.4  HGB 15.0 14.9 14.5  HCT 44.0 42.5 40.4  MCV  --  89.3 88.4  PLT  --  174 173   Cardiac Enzymes:  Recent Labs Lab 01/19/14 1904 01/20/14 0048  TROPONINI <0.30 <0.30   BNP (last 3 results)  Recent Labs  01/19/14 1904  PROBNP 2268.0*   Sepsis Labs:  Recent Labs Lab 01/19/14 2045 01/20/14 0048  WBC 4.4 4.4   Microbiology Recent Results (from the past 240 hour(s))  MRSA PCR SCREENING     Status: None   Collection Time    01/20/14 12:19 AM      Result Value Ref Range Status   MRSA by PCR NEGATIVE  NEGATIVE Final   Comment:            The GeneXpert MRSA Assay (FDA     approved for NASAL specimens     only), is one component of a     comprehensive MRSA colonization     surveillance program. It is not     intended to diagnose MRSA     infection nor to guide or     monitor treatment for     MRSA infections.     Radiographs/Studies:   Dg Chest 2 View  01/19/2014   CLINICAL DATA:  Cough.  EXAM: CHEST  2 VIEW  COMPARISON:  07/26/2011  FINDINGS: The heart is borderline enlarged which is a new finding. There is  peribronchial thickening and increased interstitial markings. Findings could be secondary to interstitial edema or bronchitis. No pleural effusion or focal infiltrate. The bony thorax is intact.  IMPRESSION: Cardiac enlargement, new since prior chest x-ray. There is also peribronchial thickening and increased interstitial markings which could reflect edema or bronchitis. No focal infiltrates or pleural effusion.   Electronically Signed   By: Kalman Jewels M.D.   On: 01/19/2014 17:46    Medications:   . amLODipine  10 mg Oral Daily  . aspirin EC  81 mg Oral Daily  . furosemide  40 mg Oral q morning - 10a  . heparin  5,000 Units Subcutaneous 3 times  per day  . hydrALAZINE  75 mg Oral 4 times per day  . metoprolol  100 mg Oral BID  . sodium chloride  3 mL Intravenous Q12H   Continuous Infusions: . nitroGLYCERIN 30 mcg/min (01/21/14 1804)    Time spent: 35 minutes with > 50% of time discussing current diagnostic test results, clinical impression and plan of care.    LOS: 3 days   [place]  [facility] Pager [phone]. If unable to reach me by pager, please call my cell phone at [phone].  *Please refer to [email or web address], password [email or web address] to get updated schedule on who will round on this patient, as hospitalists switch teams weekly. If 7PM-7AM, please contact night-coverage at [email or web address], password [email or web address] for any overnight needs.  01/22/2014, 7:12 AM    **Disclaimer: This note was dictated with voice recognition software. Similar sounding words can inadvertently be transcribed and this note may contain transcription errors which may not have been corrected upon publication of note.**

## 2014-01-23 DIAGNOSIS — I5043 Acute on chronic combined systolic (congestive) and diastolic (congestive) heart failure: Secondary | ICD-10-CM | POA: Diagnosis present

## 2014-01-23 LAB — BASIC METABOLIC PANEL
BUN: 17 mg/dL (ref 6–23)
CO2: 24 mEq/L (ref 19–32)
Calcium: 8.9 mg/dL (ref 8.4–10.5)
Chloride: 98 mEq/L (ref 96–112)
Creatinine, Ser: 1.41 mg/dL — ABNORMAL HIGH (ref 0.50–1.35)
GFR calc Af Amer: 66 mL/min — ABNORMAL LOW (ref >90)
GFR calc non Af Amer: 57 mL/min — ABNORMAL LOW (ref >90)
GLUCOSE: 90 mg/dL (ref 70–99)
Potassium: 3.8 mEq/L (ref 3.7–5.3)
Sodium: 137 mEq/L (ref 137–147)

## 2014-01-23 MED ORDER — SODIUM CHLORIDE 0.9 % IV SOLN: INTRAVENOUS | Status: DC

## 2014-01-23 MED ORDER — ISOSORBIDE MONONITRATE ER 60 MG PO TB24
60.0000 mg | ORAL_TABLET | Freq: Every day | ORAL | Status: DC
  Administered 2014-01-23 – 2014-01-25 (×3): 60 mg via ORAL
  Filled 2014-01-23 (×4): qty 1

## 2014-01-23 MED ORDER — ISOSORBIDE MONONITRATE ER 30 MG PO TB24: 30.0000 mg | ORAL_TABLET | Freq: Every day | ORAL | Status: DC

## 2014-01-23 NOTE — Progress Notes (Signed)
Report called to [facility] for transfer to [phone]

## 2014-01-23 NOTE — Progress Notes (Addendum)
Patient ID: Daniel Joseph, male   DOB: 02-26-1961, 53 y.o.   MRN: [id number]    SUBJECTIVE: Patient diuresing.  Renal function improving. Patient is scheduled for a TEE Monday, Jan 24, 2014. On Monday with TEE data and renal function data, decisions can be made about the next part of his cardiology workup.   Filed Vitals:   01/22/14 2300 01/23/14 0100 01/23/14 0300 01/23/14 0500  BP: 157/97 155/88 160/112 162/103  Pulse:      Temp:      TempSrc:      Resp: 20 18 18 18   Height:      Weight:      SpO2: 98% 99% 96% 97%     Intake/Output Summary (Last 24 hours) at 01/23/14 0718 Last data filed at 01/22/14 1900  Gross per 24 hour  Intake  82.93 ml  Output   1525 ml  Net -1442.07 ml    LABS: Basic Metabolic Panel:  Recent Labs  01/22/14 0327 01/23/14 0400  NA 134* 137  K 3.9 3.8  CL 98 98  CO2 24 24  GLUCOSE 93 90  BUN 15 17  CREATININE 1.53* 1.41*  CALCIUM 8.9 8.9   Liver Function Tests: No results found for this basename: AST, ALT, ALKPHOS, BILITOT, PROT, ALBUMIN,  in the last 72 hours No results found for this basename: LIPASE, AMYLASE,  in the last 72 hours CBC: No results found for this basename: WBC, NEUTROABS, HGB, HCT, MCV, PLT,  in the last 72 hours Cardiac Enzymes: No results found for this basename: CKTOTAL, CKMB, CKMBINDEX, TROPONINI,  in the last 72 hours BNP: No components found with this basename: POCBNP,  D-Dimer: No results found for this basename: DDIMER,  in the last 72 hours Hemoglobin A1C: No results found for this basename: HGBA1C,  in the last 72 hours Fasting Lipid Panel: No results found for this basename: CHOL, HDL, LDLCALC, TRIG, CHOLHDL, LDLDIRECT,  in the last 72 hours Thyroid Function Tests: No results found for this basename: TSH, T4TOTAL, FREET3, T3FREE, THYROIDAB,  in the last 72 hours  RADIOLOGY: Dg Chest 2 View  01/19/2014   CLINICAL DATA:  Cough.  EXAM: CHEST  2 VIEW  COMPARISON:  07/26/2011  FINDINGS: The heart is  borderline enlarged which is a new finding. There is peribronchial thickening and increased interstitial markings. Findings could be secondary to interstitial edema or bronchitis. No pleural effusion or focal infiltrate. The bony thorax is intact.  IMPRESSION: Cardiac enlargement, new since prior chest x-ray. There is also peribronchial thickening and increased interstitial markings which could reflect edema or bronchitis. No focal infiltrates or pleural effusion.   Electronically Signed   By: Kalman Jewels M.D.   On: 01/19/2014 17:46   Physical exam:  Patient is oriented to person time and place. Affect is normal. He's not having any pain. He is comfortable flat in bed. Head is atraumatic. Sclera and conjunctiva are normal. There is no jugulovenous distention. Lungs reveal a few scattered rhonchi. Cardiac exam reveals an S1 and S2. Abdomen is soft. There is no significant peripheral edema.  Telemetry:   Rhythm is regular  ASSESSMENT AND PLAN:    Accelerated hypertension    Blood pressure is still elevated. I have increased the nitrate dose today.    Acute kidney injury      Continue to follow labs as they improve.    Mitral valve regurgitation     This will be assessed by TEE tomorrow.    Tricuspid  valve mass     Further assessment by TEE Monday.    Acute on chronic combined systolic and diastolic CHF (congestive heart failure)     Patient continues to diuresis. Troponins are negative. After we have TEE data and renal function data tomorrow decision can be made if the patient should have a stress nuclear study the following day.   Daniel Joseph 01/23/2014 7:18 AM

## 2014-01-23 NOTE — Progress Notes (Addendum)
Progress Note   Daniel Joseph YIR:[id number] DOB: 06-Jun-1961 DOA: 01/19/2014 PCP: No primary provider on file.   Brief Narrative:   Daniel Joseph is an 53 y.o. male with PMH of hypertension treated with 5 antihypertensive medications but who has been unable to obtain his medications for 2 months secondary to insurance issues, who was admitted 01/20/14 with a chief complaint of headache, dyspnea, blurred vision as well as orthopnea and paroxysmal nocturnal dyspnea. Upon initial valuation in the ER, he was found to have a pro BNP of 2268, a chest x-ray significant for cardiac enlargement with possible pulmonary edema, and EKG significant for nonspecific ST and T-wave changes, and a blood pressure of 188/138.  2-D echo, done 01/20/14 showed acute systolic and diastolic CHF with an EF of 50-09%, grade 2 diastolic dysfunction, and an abnormality on the tricuspid valve concerning for mass or vegetation. Cardiology consulted 01/21/14.  Assessment/Plan:   Principal Problem:   Accelerated hypertension with acute kidney injury  The patient was admitted to the SDU, and placed on Norvasc, Lasix, metoprolol, a nitroglycerin drip, and when necessary hydralazine. Antihypertensives adjusted by cardiologist 01/21/14: Hydralazine increased with recommendations to wean off nitroglycerin drip and add Imdur 30 mg daily once blood pressure better controlled, and switch to Bidil at discharge.  Blood pressure improving: 155-162/88-112 with increase of metoprolol to 150 mg twice a day. Discontinue nitroglycerin drip and start Imdur 60 mg daily.  Patient has evidence of end organ damage with acute kidney injury. Monitor renal function closely. Active Problems:   Mild-moderate mitral valve regurgitation  Likely secondary to left ventricular dilation in the setting of CHF.   Acute systolic and diastolic CHF / Cardiomegaly / PND  2-D echo shows EF 35-40%, moderate diffuse hypokinesis, and grade 2 diastolic  dysfunction.  We'll need to start ACE inhibitor/ARB when renal function stable. Renal function not yet stable.  Seen by cardiologist 01/21/14 with plans for a TEE 01/24/14 and a stress test versus heart catheterization.   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  TEE planned for 01/24/14 to further evaluate.   Blurred vision  Likely reflective of blood pressure elevation. Resolved.   Headache  Likely reflective of accelerated hypertension or nitroglycerin. Treat symptomatically and continue efforts at lowering blood pressure.   T wave inversion in EKG  Likely from cardiac strain in the setting of accelerated hypertension.  Troponins negative x2.  Continue aspirin.   DVT Prophylaxis  Continue heparin.   Code Status: Full. Family Communication: No family present at the bedside. Disposition Plan: Home when stable.   IV Access:    Peripheral IV   Procedures:   2-D echocardiogram 01/20/14: EF 35-40% with moderate diffuse hypokinesis and possible hypokinesis of the anteroseptal myocardium. Grade 2 diastolic dysfunction. Mild-moderate mitral valve regurgitation. Nonmobile echodense mass on the posterior leaflet of the tricuspid valve consistent with vegetation or tumor.   Medical Consultants:    Dr. Fransico Him, Cardiology.   Other Consultants:    None.   Anti-Infectives:    None.  Subjective:   Daniel Joseph says his headache is beginning to improve. Continues to deny chest pain or tightness. No dyspnea. No nausea or vomiting. Appetite good.  Objective:    Filed Vitals:   01/22/14 2300 01/23/14 0100 01/23/14 0300 01/23/14 0500  BP: 157/97 155/88 160/112 162/103  Pulse:      Temp:      TempSrc:      Resp: 20 18 18  18  Height:      Weight:      SpO2: 98% 99% 96% 97%    Intake/Output Summary (Last 24 hours) at 01/23/14 0721 Last data filed at 01/22/14 1900  Gross per 24 hour  Intake  82.93 ml  Output   1525 ml  Net -1442.07 ml      Exam: Gen:  NAD Cardiovascular:  RRR, no murmurs, rubs, or gallops Respiratory:  Lungs CTAB Gastrointestinal:  Abdomen soft, NT/ND, + BS Extremities:  No C/E/C   Data Reviewed:    Labs: Basic Metabolic Panel:  Recent Labs Lab 01/19/14 2045 01/20/14 0048 01/21/14 0956 01/22/14 0327 01/23/14 0400  NA 142 138 135* 134* 137  K 4.1 3.7 3.9 3.9 3.8  CL 104 101 100 98 98  CO2 23 23 22 24 24   GLUCOSE 76 105* 99 93 90  BUN 17 18 13 15 17   CREATININE 1.34 1.47* 1.32 1.53* 1.41*  CALCIUM 9.2 9.2 9.2 8.9 8.9   GFR Estimated Creatinine Clearance: 79 ml/min (by C-G formula based on Cr of 1.41). Liver Function Tests:  Recent Labs Lab 01/20/14 0048  AST 16  ALT 17  ALKPHOS 49  BILITOT 1.0  PROT 6.5  ALBUMIN 3.7   Coagulation profile  Recent Labs Lab 01/20/14 0048  INR 1.13    CBC:  Recent Labs Lab 01/19/14 1716 01/19/14 2045 01/20/14 0048  WBC  --  4.4 4.4  HGB 15.0 14.9 14.5  HCT 44.0 42.5 40.4  MCV  --  89.3 88.4  PLT  --  174 173   Cardiac Enzymes:  Recent Labs Lab 01/19/14 1904 01/20/14 0048  TROPONINI <0.30 <0.30   BNP (last 3 results)  Recent Labs  01/19/14 1904  PROBNP 2268.0*   Sepsis Labs:  Recent Labs Lab 01/19/14 2045 01/20/14 0048  WBC 4.4 4.4   Microbiology Recent Results (from the past 240 hour(s))  MRSA PCR SCREENING     Status: None   Collection Time    01/20/14 12:19 AM      Result Value Ref Range Status   MRSA by PCR NEGATIVE  NEGATIVE Final   Comment:            The GeneXpert MRSA Assay (FDA     approved for NASAL specimens     only), is one component of a     comprehensive MRSA colonization     surveillance program. It is not     intended to diagnose MRSA     infection nor to guide or     monitor treatment for     MRSA infections.  CULTURE, BLOOD (ROUTINE X 2)     Status: None   Collection Time    01/21/14 10:50 AM      Result Value Ref Range Status   Specimen Description BLOOD LEFT ARM   Final    Special Requests BOTTLES DRAWN AEROBIC AND ANAEROBIC 10CC   Final   Culture  Setup Time     Final   Value: 01/21/2014 12:32     Performed at [facility]   Culture     Final   Value:        BLOOD CULTURE RECEIVED NO GROWTH TO DATE CULTURE WILL BE HELD FOR 5 DAYS BEFORE ISSUING A FINAL NEGATIVE REPORT     Performed at [facility]   Report Status PENDING   Incomplete  CULTURE, BLOOD (ROUTINE X 2)     Status: None   Collection Time  01/21/14 10:55 AM      Result Value Ref Range Status   Specimen Description BLOOD RIGHT HAND   Final   Special Requests BOTTLES DRAWN AEROBIC AND ANAEROBIC 4CC   Final   Culture  Setup Time     Final   Value: 01/21/2014 12:32     Performed at [facility]   Culture     Final   Value:        BLOOD CULTURE RECEIVED NO GROWTH TO DATE CULTURE WILL BE HELD FOR 5 DAYS BEFORE ISSUING A FINAL NEGATIVE REPORT     Performed at [facility]   Report Status PENDING   Incomplete     Radiographs/Studies:   Dg Chest 2 View  01/19/2014   CLINICAL DATA:  Cough.  EXAM: CHEST  2 VIEW  COMPARISON:  07/26/2011  FINDINGS: The heart is borderline enlarged which is a new finding. There is peribronchial thickening and increased interstitial markings. Findings could be secondary to interstitial edema or bronchitis. No pleural effusion or focal infiltrate. The bony thorax is intact.  IMPRESSION: Cardiac enlargement, new since prior chest x-ray. There is also peribronchial thickening and increased interstitial markings which could reflect edema or bronchitis. No focal infiltrates or pleural effusion.   Electronically Signed   By: Kalman Jewels M.D.   On: 01/19/2014 17:46    Medications:   . amLODipine  10 mg Oral Daily  . aspirin EC  81 mg Oral Daily  . furosemide  40 mg Oral q morning - 10a  . heparin  5,000 Units Subcutaneous 3 times per day  . hydrALAZINE  75 mg Oral 4 times per day  . metoprolol  150 mg Oral BID  . sodium chloride  3 mL  Intravenous Q12H   Continuous Infusions: . nitroGLYCERIN 20 mcg/min (01/23/14 0440)    Time spent: 35 minutes with > 50% of time discussing current diagnostic test results, clinical impression and plan of care, answering his questions, and coordinating care with cardiology.    LOS: 4 days   [place]  [facility] Pager [phone]. If unable to reach me by pager, please call my cell phone at [phone].  *Please refer to [email or web address], password [email or web address] to get updated schedule on who will round on this patient, as hospitalists switch teams weekly. If 7PM-7AM, please contact night-coverage at [email or web address], password [email or web address] for any overnight needs.  01/23/2014, 7:21 AM    **Disclaimer: This note was dictated with voice recognition software. Similar sounding words can inadvertently be transcribed and this note may contain transcription errors which may not have been corrected upon publication of note.**

## 2014-01-24 ENCOUNTER — Telehealth: Payer: Self-pay | Admitting: Cardiovascular Disease

## 2014-01-24 ENCOUNTER — Encounter (HOSPITAL_COMMUNITY)
Admission: EM | Disposition: A | Discharge status: Home / Self Care | Payer: Self-pay | Source: Home / Self Care | Attending: Internal Medicine

## 2014-01-24 ENCOUNTER — Encounter (HOSPITAL_COMMUNITY): Payer: Self-pay

## 2014-01-24 DIAGNOSIS — J811 Chronic pulmonary edema: Secondary | ICD-10-CM

## 2014-01-24 DIAGNOSIS — R0609 Other forms of dyspnea: Secondary | ICD-10-CM

## 2014-01-24 DIAGNOSIS — R0989 Other specified symptoms and signs involving the circulatory and respiratory systems: Secondary | ICD-10-CM

## 2014-01-24 DIAGNOSIS — R9431 Abnormal electrocardiogram [ECG] [EKG]: Secondary | ICD-10-CM

## 2014-01-24 DIAGNOSIS — I517 Cardiomegaly: Secondary | ICD-10-CM

## 2014-01-24 DIAGNOSIS — I10 Essential (primary) hypertension: Secondary | ICD-10-CM

## 2014-01-24 LAB — BASIC METABOLIC PANEL
BUN: 19 mg/dL (ref 6–23)
CO2: 23 mEq/L (ref 19–32)
CREATININE: 1.38 mg/dL — AB (ref 0.50–1.35)
Calcium: 9.2 mg/dL (ref 8.4–10.5)
Chloride: 99 mEq/L (ref 96–112)
GFR, EST AFRICAN AMERICAN: 67 mL/min — AB (ref >90)
GFR, EST NON AFRICAN AMERICAN: 58 mL/min — AB (ref >90)
Glucose, Bld: 87 mg/dL (ref 70–99)
Potassium: 3.7 mEq/L (ref 3.7–5.3)
Sodium: 136 mEq/L — ABNORMAL LOW (ref 137–147)

## 2014-01-24 SURGERY — CANCELLED PROCEDURE

## 2014-01-24 MED ORDER — HYDRALAZINE HCL 20 MG/ML IJ SOLN
10.0000 mg | Freq: Once | INTRAMUSCULAR | Status: AC
  Administered 2014-01-24: 10 mg via INTRAVENOUS

## 2014-01-24 MED ORDER — LISINOPRIL 5 MG PO TABS
5.0000 mg | ORAL_TABLET | Freq: Every day | ORAL | Status: DC
  Administered 2014-01-25: 5 mg via ORAL
  Filled 2014-01-24 (×3): qty 1

## 2014-01-24 MED ORDER — METOPROLOL TARTRATE 1 MG/ML IV SOLN
5.0000 mg | Freq: Once | INTRAVENOUS | Status: AC
  Administered 2014-01-24: 5 mg via INTRAVENOUS

## 2014-01-24 NOTE — Plan of Care (Signed)
Problem: Phase II Progression Outcomes Goal: Vital signs remain stable Outcome: Progressing To [facility] for TEE BP-168/119

## 2014-01-24 NOTE — Progress Notes (Signed)
Subjective:  Pt no longer SOB. No CP. Feeling clinically improved  Objective:  Temp:  [97.6 F (36.4 C)-98.2 F (36.8 C)] 98.2 F (36.8 C) (05/11 0605) Pulse Rate:  [58-64] 58 (05/11 0605) Resp:  [13-21] 16 (05/11 0605) BP: (139-165)/(82-110) 156/88 mmHg (05/11 0605) SpO2:  [96 %-100 %] 100 % (05/11 0605) Weight:  [223 lb 3.2 oz (101.243 kg)] 223 lb 3.2 oz (101.243 kg) (05/10 1235) Weight change: 12.1 oz (0.343 kg)  Intake/Output from previous day: 05/10 0701 - 05/11 0700 In: 846 [P.O.:840; I.V.:6] Out: 950 [Urine:950]  Intake/Output from this shift:    Physical Exam: General appearance: alert and no distress Neck: no adenopathy, no carotid bruit, no JVD, supple, symmetrical, trachea midline and thyroid not enlarged, symmetric, no tenderness/mass/nodules Lungs: clear to auscultation bilaterally Heart: + S3 Extremities: extremities normal, atraumatic, no cyanosis or edema  Lab Results: Results for orders placed during the hospital encounter of 01/19/14 (from the past 48 hour(s))  BASIC METABOLIC PANEL     Status: Abnormal   Collection Time    01/23/14  4:00 AM      Result Value Ref Range   Sodium 137  137 - 147 mEq/L   Potassium 3.8  3.7 - 5.3 mEq/L   Chloride 98  96 - 112 mEq/L   CO2 24  19 - 32 mEq/L   Glucose, Bld 90  70 - 99 mg/dL   BUN 17  6 - 23 mg/dL   Creatinine, Ser 1.41 (*) 0.50 - 1.35 mg/dL   Calcium 8.9  8.4 - 10.5 mg/dL   GFR calc non Af Amer 57 (*) >90 mL/min   GFR calc Af Amer 66 (*) >90 mL/min   Comment: (NOTE)     The eGFR has been calculated using the CKD EPI equation.     This calculation has not been validated in all clinical situations.     eGFR's persistently <90 mL/min signify possible Chronic Kidney     Disease.  BASIC METABOLIC PANEL     Status: Abnormal   Collection Time    01/24/14  3:46 AM      Result Value Ref Range   Sodium 136 (*) 137 - 147 mEq/L   Potassium 3.7  3.7 - 5.3 mEq/L   Chloride 99  96 - 112 mEq/L   CO2 23  19  - 32 mEq/L   Glucose, Bld 87  70 - 99 mg/dL   BUN 19  6 - 23 mg/dL   Creatinine, Ser 1.38 (*) 0.50 - 1.35 mg/dL   Calcium 9.2  8.4 - 10.5 mg/dL   GFR calc non Af Amer 58 (*) >90 mL/min   GFR calc Af Amer 67 (*) >90 mL/min   Comment: (NOTE)     The eGFR has been calculated using the CKD EPI equation.     This calculation has not been validated in all clinical situations.     eGFR's persistently <90 mL/min signify possible Chronic Kidney     Disease.    Imaging: Imaging results have been reviewed  Assessment/Plan:   1. Principal Problem: 2.   Accelerated hypertension 3. Active Problems: 4.   PND (paroxysmal nocturnal dyspnea) 5.   Nocturia 6.   Blurred vision 7.   Headache 8.   T wave inversion in EKG 9.   Cardiomegaly 10.   Acute kidney injury 11.   Mitral valve regurgitation 12.   Acute on chronic combined systolic and diastolic CHF, NYHA class 4 13.  Tricuspid valve mass 14.   Acute on chronic combined systolic and diastolic CHF (congestive heart failure) 15.   Time Spent Directly with Patient:  20 minutes  Length of Stay:  LOS: 5 days   Admitted with HTN urgency and CHF. Has moderate LV dysfunction probably related to HHD. Good diuresis 3.5 liters since admission. BP under better control. He has been out of his meds for > 6 months secondary to $. Need case manager to address. Pt is scheduled for TEE today to evaluate ? TV mass. His renal function is improving and I think we can probably start low dose ACE-I (lisinopril 5 mg qd).   Lorretta Harp 01/24/2014, 7:04 AM

## 2014-01-24 NOTE — Progress Notes (Signed)
Pt returned from [facility], Test unable to be done because his BP was  elevated. He will return to [facility] 5/12 for the TEE test in the AM. Pt to be NPO after MN and he is to get his BP meds before he goes for the test. Pt should arrive at 900. Transportation needs to be arranged by [facility].

## 2014-01-24 NOTE — Telephone Encounter (Signed)
Returned a call to the [employer]  Number left and was informed the patient has "already gone to [facility]."

## 2014-01-24 NOTE — Telephone Encounter (Signed)
Pt is going to have TEE,wants to know of he needs to know about his medicine.

## 2014-01-24 NOTE — Progress Notes (Signed)
K Schorr oncall provider notified of transfer to [facility] for TEE. States the physician coming on for Monday morning must complete transport certificate

## 2014-01-24 NOTE — Care Management Note (Signed)
Cm spoke with patient at the bedside concerning discharge planning. Per pt currently followed at [facility] for Primary Care. Per pt recently received orange card. Pt informed ineligible for MATCH program due to ability to retrieve medications at pharmacy inside of [facility]. Cm will inform pharmacy day of discharge. Pt had questions concerning SSI. Pt instructed to follow up with [facility] on [address] in [place]. No other needs identified at this time.    Venita Lick Ronan Dion,MSN,RN [phone]

## 2014-01-24 NOTE — Progress Notes (Signed)
Dr. Jenness Corner office called to ask if pt should take his BP medications before leaving for [facility] for a TEE test. Pt is NPO. [facility] is here to pick up pt. No response from Dr. Gwenlyn Found. Dr. Rockne Menghini informed of [phone] [id number] orders to hold BP meds. Medications due at 10:00.

## 2014-01-24 NOTE — Progress Notes (Signed)
Progress Note   Daniel Joseph KXF:[id number] DOB: Dec 20, 1960 DOA: 01/19/2014 PCP: No primary provider on file.   Brief Narrative:   Daniel Joseph is an 53 y.o. male with PMH of hypertension treated with 5 antihypertensive medications but who has been unable to obtain his medications for 2 months secondary to insurance issues, who was admitted 01/20/14 with a chief complaint of headache, dyspnea, blurred vision as well as orthopnea and paroxysmal nocturnal dyspnea. Upon initial valuation in the ER, he was found to have a pro BNP of 2268, a chest x-ray significant for cardiac enlargement with possible pulmonary edema, and EKG significant for nonspecific ST and T-wave changes, and a blood pressure of 188/138.  2-D echo, done 01/20/14 showed acute systolic and diastolic CHF with an EF of 69-67%, grade 2 diastolic dysfunction, and an abnormality on the tricuspid valve concerning for mass or vegetation. Cardiology consulted 01/21/14.  Assessment/Plan:   Principal Problem:   Accelerated hypertension with acute kidney injury  The patient was admitted to the SDU, and placed on Norvasc, Lasix, metoprolol, a nitroglycerin drip, and when necessary hydralazine. Nitroglycerin weaned off 01/23/14 and Imdur started.  Can switch to Bidil at discharge.  Blood pressure improving: 150's/88-105. Add low-dose lisinopril, but watch renal function closely. Active Problems:   Mild-moderate mitral valve regurgitation  Likely secondary to left ventricular dilation in the setting of CHF.   Acute systolic and diastolic CHF / Cardiomegaly / PND  2-D echo shows EF 35-40%, moderate diffuse hypokinesis, and grade 2 diastolic dysfunction.  Seen by cardiologist 01/21/14 with plans for a TEE 01/24/14 and a stress test versus heart catheterization.   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  TEE planned for 01/24/14 to further evaluate.   Blurred vision  Likely reflective of blood pressure elevation.  Resolved.   Headache  Likely reflective of accelerated hypertension or nitroglycerin. Treat symptomatically and continue efforts at lowering blood pressure.   T wave inversion in EKG  Likely from cardiac strain in the setting of accelerated hypertension.  Troponins negative x2.  Continue aspirin.   DVT Prophylaxis  Continue heparin.   Code Status: Full. Family Communication: No family present at the bedside. Disposition Plan: Home when stable.   IV Access:    Peripheral IV   Procedures:   2-D echocardiogram 01/20/14: EF 35-40% with moderate diffuse hypokinesis and possible hypokinesis of the anteroseptal myocardium. Grade 2 diastolic dysfunction. Mild-moderate mitral valve regurgitation. Nonmobile echodense mass on the posterior leaflet of the tricuspid valve consistent with vegetation or tumor.   Medical Consultants:    Dr. Fransico Him, Cardiology.   Other Consultants:    None.   Anti-Infectives:    None.  Subjective:   Daniel Joseph is without complaints this morning. No chest pain, dyspnea, nausea/vomiting, or other complaints.  Objective:    Filed Vitals:   01/23/14 1736 01/23/14 2043 01/23/14 2354 01/24/14 0605  BP: 152/105 158/101 153/101 156/88  Pulse: 64 60 58 58  Temp:  97.7 F (36.5 C) 98 F (36.7 C) 98.2 F (36.8 C)  TempSrc:   Oral Oral  Resp: 16 16 16 16   Height:      Weight:      SpO2: 100% 100% 100% 100%    Intake/Output Summary (Last 24 hours) at 01/24/14 0722 Last data filed at 01/24/14 0654  Gross per 24 hour  Intake    846 ml  Output    950 ml  Net   -104 ml  Exam: Gen:  NAD Cardiovascular:  RRR, no murmurs, rubs, or gallops Respiratory:  Lungs CTAB Gastrointestinal:  Abdomen soft, NT/ND, + BS Extremities:  No C/E/C   Data Reviewed:    Labs: Basic Metabolic Panel:  Recent Labs Lab 01/20/14 0048 01/21/14 0956 01/22/14 0327 01/23/14 0400 01/24/14 0346  NA 138 135* 134* 137 136*  K 3.7 3.9 3.9 3.8  3.7  CL 101 100 98 98 99  CO2 23 22 24 24 23   GLUCOSE 105* 99 93 90 87  BUN 18 13 15 17 19   CREATININE 1.47* 1.32 1.53* 1.41* 1.38*  CALCIUM 9.2 9.2 8.9 8.9 9.2   GFR Estimated Creatinine Clearance: 80.1 ml/min (by C-G formula based on Cr of 1.38). Liver Function Tests:  Recent Labs Lab 01/20/14 0048  AST 16  ALT 17  ALKPHOS 49  BILITOT 1.0  PROT 6.5  ALBUMIN 3.7   Coagulation profile  Recent Labs Lab 01/20/14 0048  INR 1.13    CBC:  Recent Labs Lab 01/19/14 1716 01/19/14 2045 01/20/14 0048  WBC  --  4.4 4.4  HGB 15.0 14.9 14.5  HCT 44.0 42.5 40.4  MCV  --  89.3 88.4  PLT  --  174 173   Cardiac Enzymes:  Recent Labs Lab 01/19/14 1904 01/20/14 0048  TROPONINI <0.30 <0.30   BNP (last 3 results)  Recent Labs  01/19/14 1904  PROBNP 2268.0*   Sepsis Labs:  Recent Labs Lab 01/19/14 2045 01/20/14 0048  WBC 4.4 4.4   Microbiology Recent Results (from the past 240 hour(s))  MRSA PCR SCREENING     Status: None   Collection Time    01/20/14 12:19 AM      Result Value Ref Range Status   MRSA by PCR NEGATIVE  NEGATIVE Final   Comment:            The GeneXpert MRSA Assay (FDA     approved for NASAL specimens     only), is one component of a     comprehensive MRSA colonization     surveillance program. It is not     intended to diagnose MRSA     infection nor to guide or     monitor treatment for     MRSA infections.  CULTURE, BLOOD (ROUTINE X 2)     Status: None   Collection Time    01/21/14 10:50 AM      Result Value Ref Range Status   Specimen Description BLOOD LEFT ARM   Final   Special Requests BOTTLES DRAWN AEROBIC AND ANAEROBIC 10CC   Final   Culture  Setup Time     Final   Value: 01/21/2014 12:32     Performed at [facility]   Culture     Final   Value:        BLOOD CULTURE RECEIVED NO GROWTH TO DATE CULTURE WILL BE HELD FOR 5 DAYS BEFORE ISSUING A FINAL NEGATIVE REPORT     Performed at [facility]   Report  Status PENDING   Incomplete  CULTURE, BLOOD (ROUTINE X 2)     Status: None   Collection Time    01/21/14 10:55 AM      Result Value Ref Range Status   Specimen Description BLOOD RIGHT HAND   Final   Special Requests BOTTLES DRAWN AEROBIC AND ANAEROBIC 4CC   Final   Culture  Setup Time     Final   Value: 01/21/2014 12:32  Performed at [facility]     Final   Value:        BLOOD CULTURE RECEIVED NO GROWTH TO DATE CULTURE WILL BE HELD FOR 5 DAYS BEFORE ISSUING A FINAL NEGATIVE REPORT     Performed at [facility]   Report Status PENDING   Incomplete     Radiographs/Studies:   Dg Chest 2 View  01/19/2014   CLINICAL DATA:  Cough.  EXAM: CHEST  2 VIEW  COMPARISON:  07/26/2011  FINDINGS: The heart is borderline enlarged which is a new finding. There is peribronchial thickening and increased interstitial markings. Findings could be secondary to interstitial edema or bronchitis. No pleural effusion or focal infiltrate. The bony thorax is intact.  IMPRESSION: Cardiac enlargement, new since prior chest x-ray. There is also peribronchial thickening and increased interstitial markings which could reflect edema or bronchitis. No focal infiltrates or pleural effusion.   Electronically Signed   By: Kalman Jewels M.D.   On: 01/19/2014 17:46    Medications:   . amLODipine  10 mg Oral Daily  . aspirin EC  81 mg Oral Daily  . furosemide  40 mg Oral q morning - 10a  . heparin  5,000 Units Subcutaneous 3 times per day  . hydrALAZINE  75 mg Oral 4 times per day  . isosorbide mononitrate  60 mg Oral Daily  . metoprolol  150 mg Oral BID  . sodium chloride  3 mL Intravenous Q12H   Continuous Infusions: . sodium chloride      Time spent: 25 minutes.    LOS: 5 days   [place]  [facility] Pager [phone]. If unable to reach me by pager, please call my cell phone at [phone].  *Please refer to [email or web address], password [email or web address] to get updated schedule on who will  round on this patient, as hospitalists switch teams weekly. If 7PM-7AM, please contact night-coverage at [email or web address], password [email or web address] for any overnight needs.  01/24/2014, 7:22 AM    **Disclaimer: This note was dictated with voice recognition software. Similar sounding words can inadvertently be transcribed and this note may contain transcription errors which may not have been corrected upon publication of note.**

## 2014-01-25 ENCOUNTER — Encounter (HOSPITAL_COMMUNITY)
Admission: EM | Disposition: A | Discharge status: Home / Self Care | Payer: Self-pay | Source: Home / Self Care | Attending: Internal Medicine

## 2014-01-25 ENCOUNTER — Encounter (HOSPITAL_COMMUNITY): Payer: Self-pay | Admitting: *Deleted

## 2014-01-25 DIAGNOSIS — I5041 Acute combined systolic (congestive) and diastolic (congestive) heart failure: Secondary | ICD-10-CM

## 2014-01-25 DIAGNOSIS — I059 Rheumatic mitral valve disease, unspecified: Secondary | ICD-10-CM

## 2014-01-25 HISTORY — PX: TEE WITHOUT CARDIOVERSION: SHX5443

## 2014-01-25 LAB — BASIC METABOLIC PANEL
BUN: 20 mg/dL (ref 6–23)
CALCIUM: 9.1 mg/dL (ref 8.4–10.5)
CO2: 27 meq/L (ref 19–32)
CREATININE: 1.39 mg/dL — AB (ref 0.50–1.35)
Chloride: 101 mEq/L (ref 96–112)
GFR calc non Af Amer: 58 mL/min — ABNORMAL LOW (ref >90)
GFR, EST AFRICAN AMERICAN: 67 mL/min — AB (ref >90)
Glucose, Bld: 91 mg/dL (ref 70–99)
Potassium: 4.2 mEq/L (ref 3.7–5.3)
Sodium: 139 mEq/L (ref 137–147)

## 2014-01-25 LAB — PRO B NATRIURETIC PEPTIDE: Pro B Natriuretic peptide (BNP): 926.1 pg/mL — ABNORMAL HIGH (ref 0–125)

## 2014-01-25 SURGERY — ECHOCARDIOGRAM, TRANSESOPHAGEAL
Anesthesia: Moderate Sedation

## 2014-01-25 MED ORDER — FUROSEMIDE 40 MG PO TABS: 40.0000 mg | ORAL_TABLET | Freq: Every day | ORAL | Status: DC

## 2014-01-25 MED ORDER — BENAZEPRIL HCL 40 MG PO TABS: 40.0000 mg | ORAL_TABLET | Freq: Every day | ORAL | Status: DC

## 2014-01-25 MED ORDER — ISOSORB DINITRATE-HYDRALAZINE 20-37.5 MG PO TABS
1.0000 | ORAL_TABLET | Freq: Three times a day (TID) | ORAL | Status: DC
Start: 2014-01-25 — End: 2014-02-10

## 2014-01-25 MED ORDER — METOPROLOL TARTRATE 100 MG PO TABS: 100.0000 mg | ORAL_TABLET | Freq: Two times a day (BID) | ORAL | Status: DC

## 2014-01-25 MED ORDER — ISOSORB DINITRATE-HYDRALAZINE 20-37.5 MG PO TABS: 1.0000 | ORAL_TABLET | Freq: Three times a day (TID) | ORAL | Status: DC

## 2014-01-25 MED ORDER — MIDAZOLAM HCL 10 MG/2ML IJ SOLN
INTRAMUSCULAR | Status: DC | PRN
  Administered 2014-01-25: 1 mg via INTRAVENOUS
  Administered 2014-01-25 (×2): 2 mg via INTRAVENOUS
  Administered 2014-01-25: 1 mg via INTRAVENOUS

## 2014-01-25 MED ORDER — ISOSORBIDE MONONITRATE ER 60 MG PO TB24: 60.0000 mg | ORAL_TABLET | Freq: Every day | ORAL | Status: DC

## 2014-01-25 MED ORDER — POTASSIUM CHLORIDE CRYS ER 10 MEQ PO TBCR: 10.0000 meq | EXTENDED_RELEASE_TABLET | Freq: Every day | ORAL | Status: DC

## 2014-01-25 MED ORDER — FENTANYL CITRATE 0.05 MG/ML IJ SOLN
INTRAMUSCULAR | Status: DC | PRN
  Administered 2014-01-25 (×3): 25 ug via INTRAVENOUS

## 2014-01-25 MED ORDER — AMLODIPINE BESYLATE 10 MG PO TABS: 10.0000 mg | ORAL_TABLET | Freq: Every day | ORAL | Status: DC

## 2014-01-25 MED ORDER — BUTAMBEN-TETRACAINE-BENZOCAINE 2-2-14 % EX AERO
INHALATION_SPRAY | CUTANEOUS | Status: DC | PRN
  Administered 2014-01-25: 2 via TOPICAL

## 2014-01-25 MED ORDER — HYDRALAZINE HCL 25 MG PO TABS: 75.0000 mg | ORAL_TABLET | Freq: Four times a day (QID) | ORAL | Status: DC

## 2014-01-25 NOTE — CV Procedure (Signed)
    Transesophageal Echocardiogram Note  JERVIS TRAPANI [id number] 12-20-1960  Procedure: Transesophageal Echocardiogram Indications: mass on TV  Procedure Details Consent: Obtained Time Out: Verified patient identification, verified procedure, site/side was marked, verified correct patient position, special equipment/implants available, Radiology Safety Procedures followed,  medications/allergies/relevent history reviewed, required imaging and test results available.  Performed  Medications: Fentanyl: 75 mcg iv Versed: 6 mg iv  Left Ventrical:  Moderate reduction of LV function, EF 35%, LVH  Mitral Valve: moderate MR  Aortic Valve: normal AV  Tricuspid Valve: trace TR.  There is a round cystic, fluid filled  structure on the base of one of the leaflets of the TV.  It does not appear to cause much dysfunction of the valve.  It does not appear to be a vegetation but appears more like a myxoma  Pulmonic Valve: normal  Left Atrium/ Left atrial appendage: no thrombus.  + spontansous contrast  ( "smoke")  Atrial septum: intact  Aorta: normal    Complications: No apparent complications Patient did tolerate procedure well.   [name], Brooke Bonito., MD, [facility] 01/25/2014, 10:48 AM

## 2014-01-25 NOTE — Progress Notes (Signed)
Subjective:  No complaints today. Denies CP/SOB  Objective:  Temp:  [97.6 F (36.4 C)-98.6 F (37 C)] 98.1 F (36.7 C) (05/12 0550) Pulse Rate:  [55-69] 58 (05/12 0550) Resp:  [16-24] 20 (05/12 0550) BP: (131-216)/(79-179) 134/83 mmHg (05/12 0550) SpO2:  [99 %-100 %] 100 % (05/12 0550) Weight change:   Intake/Output from previous day: 05/11 0701 - 05/12 0700 In: 1080 [P.O.:1080] Out: 400 [Urine:400]  Intake/Output from this shift:    Physical Exam: General appearance: alert and no distress Neck: no adenopathy, no carotid bruit, no JVD, supple, symmetrical, trachea midline and thyroid not enlarged, symmetric, no tenderness/mass/nodules Lungs: clear to auscultation bilaterally Heart: regular rate and rhythm, S1, S2 normal, no murmur, click, rub or gallop Extremities: extremities normal, atraumatic, no cyanosis or edema  Lab Results: Results for orders placed during the hospital encounter of 01/19/14 (from the past 48 hour(s))  BASIC METABOLIC PANEL     Status: Abnormal   Collection Time    01/24/14  3:46 AM      Result Value Ref Range   Sodium 136 (*) 137 - 147 mEq/L   Potassium 3.7  3.7 - 5.3 mEq/L   Chloride 99  96 - 112 mEq/L   CO2 23  19 - 32 mEq/L   Glucose, Bld 87  70 - 99 mg/dL   BUN 19  6 - 23 mg/dL   Creatinine, Ser 1.38 (*) 0.50 - 1.35 mg/dL   Calcium 9.2  8.4 - 10.5 mg/dL   GFR calc non Af Amer 58 (*) >90 mL/min   GFR calc Af Amer 67 (*) >90 mL/min   Comment: (NOTE)     The eGFR has been calculated using the CKD EPI equation.     This calculation has not been validated in all clinical situations.     eGFR's persistently <90 mL/min signify possible Chronic Kidney     Disease.  BASIC METABOLIC PANEL     Status: Abnormal   Collection Time    01/25/14  3:37 AM      Result Value Ref Range   Sodium 139  137 - 147 mEq/L   Potassium 4.2  3.7 - 5.3 mEq/L   Chloride 101  96 - 112 mEq/L   CO2 27  19 - 32 mEq/L   Glucose, Bld 91  70 - 99 mg/dL   BUN 20   6 - 23 mg/dL   Creatinine, Ser 1.39 (*) 0.50 - 1.35 mg/dL   Calcium 9.1  8.4 - 10.5 mg/dL   GFR calc non Af Amer 58 (*) >90 mL/min   GFR calc Af Amer 67 (*) >90 mL/min   Comment: (NOTE)     The eGFR has been calculated using the CKD EPI equation.     This calculation has not been validated in all clinical situations.     eGFR's persistently <90 mL/min signify possible Chronic Kidney     Disease.    Imaging: Imaging results have been reviewed  Assessment/Plan:   1. Principal Problem: 2.   Accelerated hypertension 3. Active Problems: 4.   PND (paroxysmal nocturnal dyspnea) 5.   Nocturia 6.   Blurred vision 7.   Headache 8.   T wave inversion in EKG 9.   Cardiomegaly 10.   Acute kidney injury 11.   Mitral valve regurgitation 12.   Acute on chronic combined systolic and diastolic CHF, NYHA class 4 13.   Tricuspid valve mass 14.   Acute on chronic combined systolic  and diastolic CHF (congestive heart failure) 15.   Time Spent Directly with Patient:  15 minutes  Length of Stay:  LOS: 6 days   TEE cancelled yesterday secondary to increased BP. Apparently his BP meds were held. ACE-I started. Lungs clear. Cor RRR. No periph edema. Renal FXN stable. Await results of TEE. If no pathology that requires immediate attention I think that he can probably get MPI as an OP.   Lorretta Harp 01/25/2014, 7:18 AM

## 2014-01-25 NOTE — Interval H&P Note (Signed)
History and Physical Interval Note:  01/25/2014 10:14 AM  Daniel Joseph  has presented today for surgery, with the diagnosis of evaluate valve  The various methods of treatment have been discussed with the patient and family. After consideration of risks, benefits and other options for treatment, the patient has consented to  Procedure(s): TRANSESOPHAGEAL ECHOCARDIOGRAM (TEE) (N/A) as a surgical intervention .  The patient's history has been reviewed, patient examined, no change in status, stable for surgery.  I have reviewed the patient's chart and labs.  Questions were answered to the patient's satisfaction.     Wonda Cheng [name]

## 2014-01-25 NOTE — Discharge Instructions (Signed)
Heart Failure °Heart failure is a condition in which the heart has trouble pumping blood. This means your heart does not pump blood efficiently for your body to work well. In some cases of heart failure, fluid may back up into your lungs or you may have swelling (edema) in your lower legs. Heart failure is usually a long-term (chronic) condition. It is important for you to take good care of yourself and follow your caregiver's treatment plan. °CAUSES  °Some health conditions can cause heart failure. Those health conditions include: °· High blood pressure (hypertension) causes the heart muscle to work harder than normal. When pressure in the blood vessels is high, the heart needs to pump (contract) with more force in order to circulate blood throughout the body. High blood pressure eventually causes the heart to become stiff and weak. °· Coronary artery disease (CAD) is the buildup of cholesterol and fat (plaque) in the arteries of the heart. The blockage in the arteries deprives the heart muscle of oxygen and blood. This can cause chest pain and may lead to a heart attack. High blood pressure can also contribute to CAD. °· Heart attack (myocardial infarction) occurs when 1 or more arteries in the heart become blocked. The loss of oxygen damages the muscle tissue of the heart. When this happens, part of the heart muscle dies. The injured tissue does not contract as well and weakens the heart's ability to pump blood. °· Abnormal heart valves can cause heart failure when the heart valves do not open and close properly. This makes the heart muscle pump harder to keep the blood flowing. °· Heart muscle disease (cardiomyopathy or myocarditis) is damage to the heart muscle from a variety of causes. These can include drug or alcohol abuse, infections, or unknown reasons. These can increase the risk of heart failure. °· Lung disease makes the heart work harder because the lungs do not work properly. This can cause a strain  on the heart, leading it to fail. °· Diabetes increases the risk of heart failure. High blood sugar contributes to high fat (lipid) levels in the blood. Diabetes can also cause slow damage to tiny blood vessels that carry important nutrients to the heart muscle. When the heart does not get enough oxygen and food, it can cause the heart to become weak and stiff. This leads to a heart that does not contract efficiently. °· Other conditions can contribute to heart failure. These include abnormal heart rhythms, thyroid problems, and low blood counts (anemia). °Certain unhealthy behaviors can increase the risk of heart failure. Those unhealthy behaviors include: °· Being overweight. °· Smoking or chewing tobacco. °· Eating foods high in fat and cholesterol. °· Abusing illicit drugs or alcohol. °· Lacking physical activity. °SYMPTOMS  °Heart failure symptoms may vary and can be hard to detect. Symptoms may include: °· Shortness of breath with activity, such as climbing stairs. °· Persistent cough. °· Swelling of the feet, ankles, legs, or abdomen. °· Unexplained weight gain. °· Difficulty breathing when lying flat (orthopnea). °· Waking from sleep because of the need to sit up and get more air. °· Rapid heartbeat. °· Fatigue and loss of energy. °· Feeling lightheaded, dizzy, or close to fainting. °· Loss of appetite. °· Nausea. °· Increased urination during the night (nocturia). °DIAGNOSIS  °A diagnosis of heart failure is based on your history, symptoms, physical examination, and diagnostic tests. °Diagnostic tests for heart failure may include: °· Echocardiography. °· Electrocardiography. °· Chest X-ray. °· Blood tests. °· Exercise   stress test. °· Cardiac angiography. °· Radionuclide scans. °TREATMENT  °Treatment is aimed at managing the symptoms of heart failure. Medicines, behavioral changes, or surgical intervention may be necessary to treat heart failure. °· Medicines to help treat heart failure may  include: °· Angiotensin-converting enzyme (ACE) inhibitors. This type of medicine blocks the effects of a blood protein called angiotensin-converting enzyme. ACE inhibitors relax (dilate) the blood vessels and help lower blood pressure. °· Angiotensin receptor blockers. This type of medicine blocks the actions of a blood protein called angiotensin. Angiotensin receptor blockers dilate the blood vessels and help lower blood pressure. °· Water pills (diuretics). Diuretics cause the kidneys to remove salt and water from the blood. The extra fluid is removed through urination. This loss of extra fluid lowers the volume of blood the heart pumps. °· Beta blockers. These prevent the heart from beating too fast and improve heart muscle strength. °· Digitalis. This increases the force of the heartbeat. °· Healthy behavior changes include: °· Obtaining and maintaining a healthy weight. °· Stopping smoking or chewing tobacco. °· Eating heart healthy foods. °· Limiting or avoiding alcohol. °· Stopping illicit drug use. °· Physical activity as directed by your caregiver. °· Surgical treatment for heart failure may include: °· A procedure to open blocked arteries, repair damaged heart valves, or remove damaged heart muscle tissue. °· A pacemaker to improve heart muscle function and control certain abnormal heart rhythms. °· An internal cardioverter defibrillator to treat certain serious abnormal heart rhythms. °· A left ventricular assist device to assist the pumping ability of the heart. °HOME CARE INSTRUCTIONS  °· Take your medicine as directed by your caregiver. Medicines are important in reducing the workload of your heart, slowing the progression of heart failure, and improving your symptoms. °· Do not stop taking your medicine unless directed by your caregiver. °· Do not skip any dose of medicine. °· Refill your prescriptions before you run out of medicine. Your medicines are needed every day. °· Take over-the-counter  medicine only as directed by your caregiver or pharmacist. °· Engage in moderate physical activity if directed by your caregiver. Moderate physical activity can benefit some people. The elderly and people with severe heart failure should consult with a caregiver for physical activity recommendations. °· Eat heart healthy foods. Food choices should be free of trans fat and low in saturated fat, cholesterol, and salt (sodium). Healthy choices include fresh or frozen fruits and vegetables, fish, lean meats, legumes, fat-free or low-fat dairy products, and whole grain or high fiber foods. Talk to a dietitian to learn more about heart healthy foods. °· Limit sodium if directed by your caregiver. Sodium restriction may reduce symptoms of heart failure in some people. Talk to a dietitian to learn more about heart healthy seasonings. °· Use healthy cooking methods. Healthy cooking methods include roasting, grilling, broiling, baking, poaching, steaming, or stir-frying. Talk to a dietitian to learn more about healthy cooking methods. °· Limit fluids if directed by your caregiver. Fluid restriction may reduce symptoms of heart failure in some people. °· Weigh yourself every day. Daily weights are important in the early recognition of excess fluid. You should weigh yourself every morning after you urinate and before you eat breakfast. Wear the same amount of clothing each time you weigh yourself. Record your daily weight. Provide your caregiver with your weight record. °· Monitor and record your blood pressure if directed by your caregiver. °· Check your pulse if directed by your caregiver. °· Lose weight if directed   by your caregiver. Weight loss may reduce symptoms of heart failure in some people. °· Stop smoking or chewing tobacco. Nicotine makes your heart work harder by causing your blood vessels to constrict. Do not use nicotine gum or patches before talking to your caregiver. °· Schedule and attend follow-up visits as  directed by your caregiver. It is important to keep all your appointments. °· Limit alcohol intake to no more than 1 drink per day for nonpregnant women and 2 drinks per day for men. Drinking more than that is harmful to your heart. Tell your caregiver if you drink alcohol several times a week. Talk with your caregiver about whether alcohol is safe for you. If your heart has already been damaged by alcohol or you have severe heart failure, drinking alcohol should be stopped completely. °· Stop illicit drug use. °· Stay up-to-date with immunizations. It is especially important to prevent respiratory infections through current pneumococcal and influenza immunizations. °· Manage other health conditions such as hypertension, diabetes, thyroid disease, or abnormal heart rhythms as directed by your caregiver. °· Learn to manage stress. °· Plan rest periods when fatigued. °· Learn strategies to manage high temperatures. If the weather is extremely hot: °· Avoid vigorous physical activity. °· Use air conditioning or fans or seek a cooler location. °· Avoid caffeine and alcohol. °· Wear loose-fitting, lightweight, and light-colored clothing. °· Learn strategies to manage cold temperatures. If the weather is extremely cold: °· Avoid vigorous physical activity. °· Layer clothes. °· Wear mittens or gloves, a hat, and a scarf when going outside. °· Avoid alcohol. °· Obtain ongoing education and support as needed. °· Participate or seek rehabilitation as needed to maintain or improve independence and quality of life. °SEEK MEDICAL CARE IF:  °· Your weight increases by 03 lb/1.4 kg in 1 day or 05 lb/2.3 kg in a week. °· You have increasing shortness of breath that is unusual for you. °· You are unable to participate in your usual physical activities. °· You tire easily. °· You cough more than normal, especially with physical activity. °· You have any or more swelling in areas such as your hands, feet, ankles, or abdomen. °· You  are unable to sleep because it is hard to breathe. °· You feel like your heart is beating fast (palpitations). °· You become dizzy or lightheaded upon standing up. °SEEK IMMEDIATE MEDICAL CARE IF:  °· You have difficulty breathing. °· There is a change in mental status such as decreased alertness or difficulty with concentration. °· You have a pain or discomfort in your chest. °· You have an episode of fainting (syncope). °MAKE SURE YOU:  °· Understand these instructions. °· Will watch your condition. °· Will get help right away if you are not doing well or get worse. °Document Released: 09/02/2005 Document Revised: 12/28/2012 Document Reviewed: 09/24/2012 °[employer]® Patient Information ©2014 [employer]. ° °

## 2014-01-25 NOTE — Discharge Summary (Signed)
Physician Discharge Summary  Daniel Joseph QJF:[id number] DOB: 1961/03/21 DOA: 01/19/2014  PCP: No primary provider on file. Referred to [facility].  Admit date: 01/19/2014 Discharge date: 01/25/2014   Recommendations for Outpatient Follow-Up:   1. F/U TEE recommended in 6 months to assess stability of tricuspid valve mass. 2. Recommend close followup of blood pressure.   Discharge Diagnosis:   Principal Problem:    Accelerated hypertension Active Problems:    PND (paroxysmal nocturnal dyspnea)    Nocturia    Blurred vision    Headache    T wave inversion in EKG    Cardiomegaly    Acute kidney injury    Mitral valve regurgitation    Acute on chronic combined systolic and diastolic CHF, NYHA class 4    Tricuspid valve mass  Discharge Condition: Improved.  Diet recommendation: Low sodium, heart healthy.     History of Present Illness:   Daniel Joseph is an 53 y.o. male with PMH of hypertension treated with 5 antihypertensive medications but who has been unable to obtain his medications for 2 months secondary to insurance issues, who was admitted 01/20/14 with a chief complaint of headache, dyspnea, blurred vision as well as orthopnea and paroxysmal nocturnal dyspnea. Upon initial valuation in the ER, he was found to have a pro BNP of 2268, a chest x-ray significant for cardiac enlargement with possible pulmonary edema, and EKG significant for nonspecific ST and T-wave changes, and a blood pressure of 188/138. 2-D echo, done 01/20/14 showed acute systolic and diastolic CHF with an EF of 89-37%, grade 2 diastolic dysfunction, and an abnormality on the tricuspid valve concerning for mass or vegetation. Cardiology consulted 01/21/14.   Hospital Course by Problem:   Principal Problem:  Accelerated hypertension with acute kidney injury  The patient was admitted to the SDU, and placed on Norvasc, Lasix, metoprolol, a nitroglycerin drip, and when necessary hydralazine.  Nitroglycerin weaned off 01/23/14 and Imdur started. Lisinopril added 01/24/14.  Blood pressure improving: 130-140's-70-90's. Renal function stable with lisinopril. Active Problems:  Mild-moderate mitral valve regurgitation  Likely secondary to left ventricular dilation in the setting of CHF. Acute systolic and diastolic CHF / Cardiomegaly / PND  2-D echo showed EF 35-40%, moderate diffuse hypokinesis, and grade 2 diastolic dysfunction.  Seen by cardiologist 01/21/14, TEE done 01/25/14 which showed a tricuspid mass, EF 35-40%. 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  Followup TEE recommended in 6 months. Blurred vision  Likely reflective of blood pressure elevation. Resolved. Headache  Likely reflective of accelerated hypertension or nitroglycerin. Treated symptomatically. T wave inversion in EKG  Likely from cardiac strain in the setting of accelerated hypertension.  Troponins negative x2.  Continue aspirin.  Procedures:   2-D echocardiogram 01/20/14: EF 35-40% with moderate diffuse hypokinesis and possible hypokinesis of the anteroseptal myocardium. Grade 2 diastolic dysfunction. Mild-moderate mitral valve regurgitation. Nonmobile echodense mass on the posterior leaflet of the tricuspid valve consistent with vegetation or tumor.  Transesophageal echocardiogram 01/25/14: EF 35-40%. Moderate mitral valve regurgitation. Smooth spherical mass on the base of the tricuspid valve measuring 1.2 X1 0.3 cm attached by a broad base.   Medical Consultants:    Dr. Fransico Him, Cardiology  Discharge Exam:   Filed Vitals:   01/25/14 1417  BP: 131/75  Pulse: 64  Temp: 97.5 F (36.4 C)  Resp: 16   Filed Vitals:   01/25/14 1105 01/25/14 1110 01/25/14 1138 01/25/14 1417  BP:   135/85 131/75  Pulse: 61 63 57 64  Temp:   97.9 F (36.6 C) 97.5 F (36.4 C)  TempSrc:   Oral Oral  Resp: 22 19 16 16   Height:      Weight:      SpO2: 99% 96% 100%  100%    Gen:  NAD Cardiovascular:  RRR, No M/R/G Respiratory: Lungs CTAB Gastrointestinal: Abdomen soft, NT/ND with normal active bowel sounds. Extremities: No C/E/C    Discharge Instructions:   Discharge Orders   Future Orders Complete By Expires   ([place]) Call MD:  Anytime you have any of the following symptoms: 1) 3 pound weight gain in 24 hours or 5 pounds in 1 week 2) shortness of breath, with or without a dry hacking cough 3) swelling in the hands, feet or stomach 4) if you have to sleep on extra pillows at night in order to breathe.  As directed    Call MD for:  extreme fatigue  As directed    Diet - low sodium heart healthy  As directed    Discharge instructions  As directed    Increase activity slowly  As directed        Medication List    STOP taking these medications       cloNIDine 0.1 MG tablet  Commonly known as:  CATAPRES      TAKE these medications       amLODipine 10 MG tablet  Commonly known as:  NORVASC  Take 1 tablet (10 mg total) by mouth daily.     aspirin 325 MG tablet  Take 325 mg by mouth daily.     benazepril 40 MG tablet  Commonly known as:  LOTENSIN  Take 1 tablet (40 mg total) by mouth daily.     furosemide 40 MG tablet  Commonly known as:  LASIX  Take 1 tablet (40 mg total) by mouth daily.     hydrALAZINE 25 MG tablet  Commonly known as:  APRESOLINE  Take 3 tablets (75 mg total) by mouth every 6 (six) hours.     isosorbide mononitrate 60 MG 24 hr tablet  Commonly known as:  IMDUR  Take 1 tablet (60 mg total) by mouth daily.     metoprolol 100 MG tablet  Commonly known as:  LOPRESSOR  Take 1 tablet (100 mg total) by mouth 2 (two) times daily.     potassium chloride 10 MEQ tablet  Commonly known as:  K-DUR,KLOR-CON  Take 1 tablet (10 mEq total) by mouth daily.           Follow-up Information   Follow up with [facility]    . (Call to schedule an appointment for PCP  follow-up)    Contact information:   [place] [address] [phone]      Follow up In 1 week. (For a re-check of your blood pressure.)       Follow up with Sueanne Margarita, MD. Schedule an appointment as soon as possible for a visit in 6 weeks. (Follow up of your heart disease.)    Specialty:  Cardiology   Contact information:   [address] [place] [address] [phone]        The results of significant diagnostics from this hospitalization (including imaging, microbiology, ancillary and laboratory) are listed below for reference.     Significant Diagnostic Studies:   Radiographs: Dg Chest 2 View  01/19/2014   CLINICAL DATA:  Cough.  EXAM: CHEST  2 VIEW  COMPARISON:  07/26/2011  FINDINGS: The heart is borderline enlarged which is a new finding. There is peribronchial thickening and increased interstitial markings. Findings could be secondary to interstitial edema or bronchitis. No pleural effusion or focal infiltrate. The bony thorax is intact.  IMPRESSION: Cardiac enlargement, new since prior chest x-ray. There is also peribronchial thickening and increased interstitial markings which could reflect edema or bronchitis. No focal infiltrates or pleural effusion.   Electronically Signed   By: Kalman Jewels M.D.   On: 01/19/2014 17:46    Labs:  Basic Metabolic Panel:  Recent Labs Lab 01/21/14 0956 01/22/14 0327 01/23/14 0400 01/24/14 0346 01/25/14 0337  NA 135* 134* 137 136* 139  K 3.9 3.9 3.8 3.7 4.2  CL 100 98 98 99 101  CO2 22 24 24 23 27   GLUCOSE 99 93 90 87 91  BUN 13 15 17 19 20   CREATININE 1.32 1.53* 1.41* 1.38* 1.39*  CALCIUM 9.2 8.9 8.9 9.2 9.1   GFR Estimated Creatinine Clearance: 79.2 ml/min (by C-G formula based on Cr of 1.39). Liver Function Tests:  Recent Labs Lab 01/20/14 0048  AST 16  ALT 17  ALKPHOS 49  BILITOT 1.0  PROT 6.5  ALBUMIN 3.7   Coagulation profile  Recent Labs Lab 01/20/14 0048    INR 1.13    CBC:  Recent Labs Lab 01/19/14 1716 01/19/14 2045 01/20/14 0048  WBC  --  4.4 4.4  HGB 15.0 14.9 14.5  HCT 44.0 42.5 40.4  MCV  --  89.3 88.4  PLT  --  174 173   Cardiac Enzymes:  Recent Labs Lab 01/19/14 1904 01/20/14 0048  TROPONINI <0.30 <0.30   Microbiology Recent Results (from the past 240 hour(s))  MRSA PCR SCREENING     Status: None   Collection Time    01/20/14 12:19 AM      Result Value Ref Range Status   MRSA by PCR NEGATIVE  NEGATIVE Final   Comment:            The GeneXpert MRSA Assay (FDA     approved for NASAL specimens     only), is one component of a     comprehensive MRSA colonization     surveillance program. It is not     intended to diagnose MRSA     infection nor to guide or     monitor treatment for     MRSA infections.  CULTURE, BLOOD (ROUTINE X 2)     Status: None   Collection Time    01/21/14 10:50 AM      Result Value Ref Range Status   Specimen Description BLOOD LEFT ARM   Final   Special Requests BOTTLES DRAWN AEROBIC AND ANAEROBIC 10CC   Final   Culture  Setup Time     Final   Value: 01/21/2014 12:32     Performed at [facility]   Culture     Final   Value:        BLOOD CULTURE RECEIVED NO GROWTH TO DATE CULTURE WILL BE HELD FOR 5 DAYS BEFORE ISSUING A FINAL NEGATIVE REPORT     Performed at [facility]   Report Status PENDING   Incomplete  CULTURE, BLOOD (ROUTINE X 2)     Status: None   Collection Time    01/21/14 10:55 AM      Result Value Ref Range Status   Specimen Description BLOOD RIGHT HAND  Final   Special Requests BOTTLES DRAWN AEROBIC AND ANAEROBIC 4CC   Final   Culture  Setup Time     Final   Value: 01/21/2014 12:32     Performed at [facility]   Culture     Final   Value:        BLOOD CULTURE RECEIVED NO GROWTH TO DATE CULTURE WILL BE HELD FOR 5 DAYS BEFORE ISSUING A FINAL NEGATIVE REPORT     Performed at [facility]   Report Status PENDING   Incomplete     Time coordinating discharge: 35 minutes.  SignedVenetia Maxon Rama  Pager [phone] [facility] 01/25/2014, 3:50 PM

## 2014-01-25 NOTE — Progress Notes (Signed)
Patient discharged home, all discharge medications and instructions reviewed and questions answered. Patient to be assisted to vehicle by wheelchair.  

## 2014-01-25 NOTE — Progress Notes (Signed)
Echocardiogram Echocardiogram Transesophageal has been performed.  Winston Sobczyk Orlean Patten 01/25/2014, 12:14 PM

## 2014-01-25 NOTE — Progress Notes (Signed)
Patient returned from [facility] endo procedure area, no changes in pt's assessment.  97.9, 59, 16, 135/85, 100% RA

## 2014-01-25 NOTE — H&P (View-Only) (Signed)
Subjective:  No complaints today. Denies CP/SOB  Objective:  Temp:  [97.6 F (36.4 C)-98.6 F (37 C)] 98.1 F (36.7 C) (05/12 0550) Pulse Rate:  [55-69] 58 (05/12 0550) Resp:  [16-24] 20 (05/12 0550) BP: (131-216)/(79-179) 134/83 mmHg (05/12 0550) SpO2:  [99 %-100 %] 100 % (05/12 0550) Weight change:   Intake/Output from previous day: 05/11 0701 - 05/12 0700 In: 1080 [P.O.:1080] Out: 400 [Urine:400]  Intake/Output from this shift:    Physical Exam: General appearance: alert and no distress Neck: no adenopathy, no carotid bruit, no JVD, supple, symmetrical, trachea midline and thyroid not enlarged, symmetric, no tenderness/mass/nodules Lungs: clear to auscultation bilaterally Heart: regular rate and rhythm, S1, S2 normal, no murmur, click, rub or gallop Extremities: extremities normal, atraumatic, no cyanosis or edema  Lab Results: Results for orders placed during the hospital encounter of 01/19/14 (from the past 48 hour(s))  BASIC METABOLIC PANEL     Status: Abnormal   Collection Time    01/24/14  3:46 AM      Result Value Ref Range   Sodium 136 (*) 137 - 147 mEq/L   Potassium 3.7  3.7 - 5.3 mEq/L   Chloride 99  96 - 112 mEq/L   CO2 23  19 - 32 mEq/L   Glucose, Bld 87  70 - 99 mg/dL   BUN 19  6 - 23 mg/dL   Creatinine, Ser 1.38 (*) 0.50 - 1.35 mg/dL   Calcium 9.2  8.4 - 10.5 mg/dL   GFR calc non Af Amer 58 (*) >90 mL/min   GFR calc Af Amer 67 (*) >90 mL/min   Comment: (NOTE)     The eGFR has been calculated using the CKD EPI equation.     This calculation has not been validated in all clinical situations.     eGFR's persistently <90 mL/min signify possible Chronic Kidney     Disease.  BASIC METABOLIC PANEL     Status: Abnormal   Collection Time    01/25/14  3:37 AM      Result Value Ref Range   Sodium 139  137 - 147 mEq/L   Potassium 4.2  3.7 - 5.3 mEq/L   Chloride 101  96 - 112 mEq/L   CO2 27  19 - 32 mEq/L   Glucose, Bld 91  70 - 99 mg/dL   BUN 20   6 - 23 mg/dL   Creatinine, Ser 1.39 (*) 0.50 - 1.35 mg/dL   Calcium 9.1  8.4 - 10.5 mg/dL   GFR calc non Af Amer 58 (*) >90 mL/min   GFR calc Af Amer 67 (*) >90 mL/min   Comment: (NOTE)     The eGFR has been calculated using the CKD EPI equation.     This calculation has not been validated in all clinical situations.     eGFR's persistently <90 mL/min signify possible Chronic Kidney     Disease.    Imaging: Imaging results have been reviewed  Assessment/Plan:   1. Principal Problem: 2.   Accelerated hypertension 3. Active Problems: 4.   PND (paroxysmal nocturnal dyspnea) 5.   Nocturia 6.   Blurred vision 7.   Headache 8.   T wave inversion in EKG 9.   Cardiomegaly 10.   Acute kidney injury 11.   Mitral valve regurgitation 12.   Acute on chronic combined systolic and diastolic CHF, NYHA class 4 13.   Tricuspid valve mass 14.   Acute on chronic combined systolic  and diastolic CHF (congestive heart failure) 15.   Time Spent Directly with Patient:  15 minutes  Length of Stay:  LOS: 6 days   TEE cancelled yesterday secondary to increased BP. Apparently his BP meds were held. ACE-I started. Lungs clear. Cor RRR. No periph edema. Renal FXN stable. Await results of TEE. If no pathology that requires immediate attention I think that he can probably get MPI as an OP.   Lorretta Harp 01/25/2014, 7:18 AM

## 2014-01-26 ENCOUNTER — Encounter (HOSPITAL_COMMUNITY): Payer: Self-pay | Admitting: Cardiovascular Disease

## 2014-01-27 LAB — CULTURE, BLOOD (ROUTINE X 2)
CULTURE: NO GROWTH
Culture: NO GROWTH

## 2014-02-01 ENCOUNTER — Ambulatory Visit (INDEPENDENT_AMBULATORY_CARE_PROVIDER_SITE_OTHER): Payer: No Typology Code available for payment source | Admitting: Family Medicine

## 2014-02-01 ENCOUNTER — Encounter: Payer: Self-pay | Admitting: Family Medicine

## 2014-02-01 VITALS — BP 150/90 | HR 60 | Temp 98.3°F | Resp 20 | Ht 73.0 in | Wt 223.0 lb

## 2014-02-01 DIAGNOSIS — H538 Other visual disturbances: Secondary | ICD-10-CM

## 2014-02-01 DIAGNOSIS — I078 Other rheumatic tricuspid valve diseases: Secondary | ICD-10-CM

## 2014-02-01 DIAGNOSIS — I10 Essential (primary) hypertension: Secondary | ICD-10-CM

## 2014-02-01 DIAGNOSIS — N179 Acute kidney failure, unspecified: Secondary | ICD-10-CM

## 2014-02-01 DIAGNOSIS — H5789 Other specified disorders of eye and adnexa: Secondary | ICD-10-CM

## 2014-02-01 DIAGNOSIS — R221 Localized swelling, mass and lump, neck: Secondary | ICD-10-CM

## 2014-02-01 DIAGNOSIS — I059 Rheumatic mitral valve disease, unspecified: Secondary | ICD-10-CM

## 2014-02-01 DIAGNOSIS — I369 Nonrheumatic tricuspid valve disorder, unspecified: Secondary | ICD-10-CM

## 2014-02-01 DIAGNOSIS — R220 Localized swelling, mass and lump, head: Secondary | ICD-10-CM

## 2014-02-01 DIAGNOSIS — I340 Nonrheumatic mitral (valve) insufficiency: Secondary | ICD-10-CM

## 2014-02-01 NOTE — Progress Notes (Signed)
Subjective:    Patient ID: Daniel Joseph, male    DOB: 16-Mar-1961, 53 y.o.   MRN: [id number]  HPI Patient in office to establish care.   Patient has a history of uncontrolled hypertension. He reports that he is currently taking 4 blood pressure medications. Prior to scheduling appt he was a patient at [facility]. Patient states that he was [facility]. Initial appointment with [facility] was on 01/16/2014, the staff immediately sent patient to the hospital. Patient was admitted for accelerated hypertension with CHF. Reports that he had been off of blood pressure medications for 2 months prior to hospital admission. He states that he is taking medications consistently since leaving the hospital.       Review of Systems  Constitutional: Negative.  Negative for fatigue.  HENT: Negative.   Eyes: Positive for redness and visual disturbance (occasional blurry vision). Negative for photophobia and discharge.  Respiratory: Negative.  Negative for chest tightness and shortness of breath.   Cardiovascular: Negative for chest pain, palpitations and leg swelling.  Gastrointestinal: Negative.   Endocrine: Negative.   Genitourinary: Negative.   Musculoskeletal: Negative.   Skin: Negative.   Allergic/Immunologic: Negative.   Neurological: Negative.  Negative for tremors, weakness and numbness.  Hematological: Negative.   Psychiatric/Behavioral: Negative.        Objective:   Physical Exam  Constitutional: Vital signs are normal. He appears well-developed and well-nourished.  HENT:  Head: Normocephalic and atraumatic.  Right Ear: Hearing and tympanic membrane normal.  Left Ear: Hearing and tympanic membrane normal.  Nose: Nose normal.  Mouth/Throat: Uvula is midline.  Eyes: Pupils are equal, round, and reactive to light. Left eye exhibits no exudate. No foreign body present in the left eye.  Neck: Normal range of motion and full  passive range of motion without pain. Neck supple. Normal carotid pulses and no JVD present. No muscular tenderness present. Carotid bruit is not present. No erythema and normal range of motion present. No thyromegaly present.    Cardiovascular: Normal rate and normal pulses.  An irregular rhythm present.  Pulmonary/Chest: Breath sounds normal. Apnea noted. No respiratory distress. He has no wheezes. He has no rhonchi. He has no rales. He exhibits no mass, no tenderness, no edema and no retraction.  Abdominal: Soft. Normal appearance and normal aorta. There is no tenderness.  Musculoskeletal:       Lumbar back: Normal.  Lymphadenopathy:       Head (right side): No submental and no submandibular adenopathy present.       Head (left side): No submental and no submandibular adenopathy present.    He has no cervical adenopathy.  Neurological: He is alert. He has normal strength. No cranial nerve deficit or sensory deficit.  Skin: Skin is warm, dry and intact.          Assessment & Plan:  1. Uncontrolled hypertension: Elevated blood pressure: Uncontrolled on current medication regimen. Patient reports that he has taken all anti-hypertensive medications this am.  Blood pressure is currently 150/90 manually. Patient scheduled with cardiologist, Dr. Fransico Him on 02/24/2014. Patient to check blood pressures at home and bring BP diary to next appointment. Discussed hypertension at length.   2. Neck Fullness: Will return for labs on 02/08/2014, will check TSH  3. Mitral regurgitation: Reviewed Echo from 01/20/2014, EF 35-40% . Appt with cardiologist on 02/24/2014  4. Tricuspid valve mass: Recommended a TEE in 6 months by hospitalist. Cardiology  follow up on 02/24/2014  5. Blurry vision/ Eye redness: Maintains that his eyes have been red since hospital stay. Denies pain, photophobia, no exudate present. He states that he has intermittent blurred vision.  Will send to opthalmology. Reports that he has  not had an opthalmology visit.    6. Acute Kidney Injury: Will check BMP  Preventative care:  Non-smoker/occasional drinker Patient has not had initial colonoscopy. Will send referral  Up to date on vaccinations. Tetanus vaccination 1 year ago (injury) Last colonoscopy was 08/31/2013  Return in 1 week to see Dr. Zigmund Daniel for  hypertension.  Lab: TSH, BMP   Dorena Dew, FNP

## 2014-02-03 ENCOUNTER — Telehealth: Payer: Self-pay | Admitting: Internal Medicine

## 2014-02-03 DIAGNOSIS — Z7689 Persons encountering health services in other specified circumstances: Secondary | ICD-10-CM | POA: Insufficient documentation

## 2014-02-03 NOTE — Telephone Encounter (Signed)
Left voicemail for patient to come in for labs on Tuesday, May 26,2015.

## 2014-02-10 ENCOUNTER — Encounter: Payer: Self-pay | Admitting: Internal Medicine

## 2014-02-10 ENCOUNTER — Ambulatory Visit (INDEPENDENT_AMBULATORY_CARE_PROVIDER_SITE_OTHER): Payer: No Typology Code available for payment source | Admitting: Internal Medicine

## 2014-02-10 VITALS — BP 180/120 | HR 100 | Temp 97.6°F | Resp 20 | Wt 224.0 lb

## 2014-02-10 DIAGNOSIS — N183 Chronic kidney disease, stage 3 unspecified: Secondary | ICD-10-CM

## 2014-02-10 DIAGNOSIS — Z23 Encounter for immunization: Secondary | ICD-10-CM

## 2014-02-10 DIAGNOSIS — R809 Proteinuria, unspecified: Secondary | ICD-10-CM

## 2014-02-10 DIAGNOSIS — I10 Essential (primary) hypertension: Secondary | ICD-10-CM

## 2014-02-10 MED ORDER — HYDRALAZINE HCL 25 MG PO TABS: 25.0000 mg | ORAL_TABLET | Freq: Three times a day (TID) | ORAL | Status: DC

## 2014-02-10 NOTE — Progress Notes (Signed)
   Subjective:    Patient ID: Daniel Joseph, male    DOB: Feb 02, 1961, 53 y.o.   MRN: [id number]  HPI: Pt here for follow up care. States that he has been compliant with his medications however he reports that the Pharmacist called the Cardiologist who discontinued the Hydralazine.  He has no complains at this time. He denies any dizziness, CP, SOB.     Review of Systems     Objective:   Physical Exam  Vitals reviewed. Constitutional: He is oriented to person, place, and time. He appears well-developed and well-nourished. He appears distressed.  HENT:  Head: Atraumatic.  Eyes: Conjunctivae and EOM are normal. Pupils are equal, round, and reactive to light. No scleral icterus.  Neck: Normal range of motion. Neck supple.  Cardiovascular: Normal rate and regular rhythm.  Exam reveals no gallop and no friction rub.   No murmur heard. Pulmonary/Chest: Effort normal and breath sounds normal. He has no wheezes. He has no rales. He exhibits no tenderness.  Abdominal: Soft. Bowel sounds are normal. He exhibits no mass.  Musculoskeletal: Normal range of motion.  Neurological: He is alert and oriented to person, place, and time.  Skin: Skin is warm and dry.  Psychiatric: He has a normal mood and affect. His behavior is normal. Judgment and thought content normal.   BP 180/108  Pulse 100  Temp(Src) 97.6 F (36.4 C) (Oral)  Resp 20  Wt 224 lb (101.606 kg). Repeat BP 172/94, HR 92.       Assessment & Plan:  1. CKD II: Continue Benazepril. Pt has proteinuria noted in last urinalysis on 5/6 2015. Will obtain urine protein/Cr ratio.   2. Chronic Systolic/dialstolic heart failure: Clinically stable. Continue Benazepril and Norvasc.   3. Protienuria:  4. HTN: Pt will be resumed on Hydralazine 25 mg TID. Continue Norvasc, Benazepril and Metoroplol. Re check BP in 1 week  5. Preventative Care: Needs colonoscopy. Tdap Today. Pnemumovax on next visit indicated because of CHF and  proteinuria.  Labs Today: Urine Pr/Cr ratio, TSH  Labs: 3 months, BMET, Mg, U/A

## 2014-02-15 ENCOUNTER — Encounter: Payer: Self-pay | Admitting: Internal Medicine

## 2014-02-15 ENCOUNTER — Telehealth: Payer: Self-pay

## 2014-02-15 DIAGNOSIS — Z23 Encounter for immunization: Secondary | ICD-10-CM | POA: Insufficient documentation

## 2014-02-15 DIAGNOSIS — N183 Chronic kidney disease, stage 3 unspecified: Secondary | ICD-10-CM | POA: Insufficient documentation

## 2014-02-15 DIAGNOSIS — I10 Essential (primary) hypertension: Secondary | ICD-10-CM | POA: Insufficient documentation

## 2014-02-15 DIAGNOSIS — R809 Proteinuria, unspecified: Secondary | ICD-10-CM | POA: Insufficient documentation

## 2014-02-15 NOTE — Telephone Encounter (Signed)
Message   Call patient Received: Today      Daniel Gamer, MD    Eliott Nine, CMA               Please check to see if Mr. Nolt has an appointment for BP check this week. If he does not then he needs to come in this week for BP check and to have

## 2014-02-16 ENCOUNTER — Telehealth: Payer: Self-pay | Admitting: Internal Medicine

## 2014-02-16 NOTE — Telephone Encounter (Signed)
Called patient to schedule appointment for BP check.

## 2014-02-17 ENCOUNTER — Encounter: Payer: No Typology Code available for payment source | Admitting: Internal Medicine

## 2014-02-17 ENCOUNTER — Ambulatory Visit: Payer: No Typology Code available for payment source | Admitting: Hematology

## 2014-02-17 ENCOUNTER — Other Ambulatory Visit: Payer: Self-pay | Admitting: Internal Medicine

## 2014-02-17 DIAGNOSIS — I10 Essential (primary) hypertension: Secondary | ICD-10-CM

## 2014-02-17 DIAGNOSIS — R221 Localized swelling, mass and lump, neck: Secondary | ICD-10-CM

## 2014-02-17 DIAGNOSIS — N183 Chronic kidney disease, stage 3 unspecified: Secondary | ICD-10-CM

## 2014-02-17 DIAGNOSIS — R809 Proteinuria, unspecified: Secondary | ICD-10-CM

## 2014-02-17 LAB — BASIC METABOLIC PANEL WITH GFR
BUN: 17 mg/dL (ref 6–23)
CHLORIDE: 100 meq/L (ref 96–112)
CO2: 30 meq/L (ref 19–32)
CREATININE: 1.22 mg/dL (ref 0.50–1.35)
Calcium: 9.6 mg/dL (ref 8.4–10.5)
GFR, Est African American: 79 mL/min
GFR, Est Non African American: 69 mL/min
Glucose, Bld: 88 mg/dL (ref 70–99)
Potassium: 4.5 mEq/L (ref 3.5–5.3)
Sodium: 138 mEq/L (ref 135–145)

## 2014-02-18 LAB — PROTEIN / CREATININE RATIO, URINE
CREATININE, URINE: 41.1 mg/dL
PROTEIN CREATININE RATIO: 0.07 (ref <0.15)
Total Protein, Urine: 3 mg/dL

## 2014-02-18 LAB — TSH: TSH: 1.445 u[IU]/mL (ref 0.350–4.500)

## 2014-02-24 ENCOUNTER — Ambulatory Visit: Payer: No Typology Code available for payment source

## 2014-02-24 VITALS — BP 160/90

## 2014-02-24 DIAGNOSIS — I10 Essential (primary) hypertension: Secondary | ICD-10-CM

## 2014-03-02 ENCOUNTER — Ambulatory Visit (INDEPENDENT_AMBULATORY_CARE_PROVIDER_SITE_OTHER): Payer: No Typology Code available for payment source | Admitting: Cardiology

## 2014-03-02 ENCOUNTER — Encounter: Payer: Self-pay | Admitting: Cardiology

## 2014-03-02 VITALS — BP 122/96 | HR 60 | Ht 73.0 in | Wt 222.0 lb

## 2014-03-02 DIAGNOSIS — I509 Heart failure, unspecified: Secondary | ICD-10-CM

## 2014-03-02 DIAGNOSIS — I5042 Chronic combined systolic (congestive) and diastolic (congestive) heart failure: Secondary | ICD-10-CM | POA: Insufficient documentation

## 2014-03-02 DIAGNOSIS — I428 Other cardiomyopathies: Secondary | ICD-10-CM

## 2014-03-02 DIAGNOSIS — R9431 Abnormal electrocardiogram [ECG] [EKG]: Secondary | ICD-10-CM

## 2014-03-02 DIAGNOSIS — I10 Essential (primary) hypertension: Secondary | ICD-10-CM | POA: Insufficient documentation

## 2014-03-02 DIAGNOSIS — I369 Nonrheumatic tricuspid valve disorder, unspecified: Secondary | ICD-10-CM

## 2014-03-02 DIAGNOSIS — I078 Other rheumatic tricuspid valve diseases: Secondary | ICD-10-CM

## 2014-03-02 DIAGNOSIS — I43 Cardiomyopathy in diseases classified elsewhere: Secondary | ICD-10-CM

## 2014-03-02 DIAGNOSIS — I119 Hypertensive heart disease without heart failure: Secondary | ICD-10-CM

## 2014-03-02 MED ORDER — HYDRALAZINE HCL 50 MG PO TABS: 50.0000 mg | ORAL_TABLET | Freq: Three times a day (TID) | ORAL | Status: DC

## 2014-03-02 NOTE — Patient Instructions (Addendum)
Your physician has recommended you make the following change in your medication: 1. Increase Hydralazine to 50 MG 1 tablet Three times daily  Your physician recommends that you schedule a follow-up appointment in: One week with Nurse to check BP  Your physician has requested that you have a lexiscan myoview. For further information please visit [email or web address]. Please follow instruction sheet, as given.  Your physician wants you to follow-up in: 6 months with Dr Mallie Snooks will receive a reminder letter in the mail two months in advance. If you don't receive a letter, please call our office to schedule the follow-up appointment.

## 2014-03-02 NOTE — Progress Notes (Signed)
[address], [place], [place]  [address] Phone: [phone] Fax:  [phone]  Date:  03/02/2014   ID:  Daniel Joseph, DOB [birth year], MRN [id number]  PCP:  MATTHEWS,MICHELLE A., MD  Cardiologist:  Fransico Him, MD     History of Present Illness: Daniel Joseph is an 53 y.o. male with PMH of hypertension treated with 5 antihypertensive medications but who has was unable to obtain his medications for 2 months secondary to insurance issues, who was admitted 01/20/14 with a chief complaint of headache, dyspnea, blurred vision as well as orthopnea and paroxysmal nocturnal dyspnea. Upon initial valuation in the ER, he was found to have a pro BNP of 2268, a chest x-ray significant for cardiac enlargement with possible pulmonary edema, and EKG significant for nonspecific ST and T-wave changes, and a blood pressure of 188/138. 2-D echo, done 01/20/14 showed acute systolic and diastolic CHF with an EF of 31-51%, grade 2 diastolic dysfunction, and an abnormality on the tricuspid valve concerning for mass or vegetation. Cardiology consulted 01/21/14. He was placed on norvasc, Lasix, metoprolol, Imdur, Hydralazine and Benazepril.  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  He now presents for followup. He is doing well.  He denies any chest pain or SOB unless he gets out in the very hot weather and then will have some chest tightness.  He denies any LE edema.  He denies any palpitations or syncope but gets dizzy in the heat.   Wt Readings from Last 3 Encounters:  03/02/14 222 lb (100.699 kg)  02/10/14 224 lb (101.606 kg)  02/01/14 223 lb (101.152 kg)     Past Medical History  Diagnosis Date  . Hypertension   . Mitral valve regurgitation 01/21/2014  . Systolic and diastolic CHF, acute 03/21/1606  . 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 Accelerated hypertension 01/19/2014  . Acute on chronic combined  systolic and diastolic CHF (congestive heart failure) 01/23/2014    Current Outpatient Prescriptions  Medication Sig Dispense Refill  . amLODipine (NORVASC) 10 MG tablet Take 1 tablet (10 mg total) by mouth daily.  30 tablet  3  . aspirin 325 MG tablet Take 325 mg by mouth daily.      . benazepril (LOTENSIN) 40 MG tablet Take 1 tablet (40 mg total) by mouth daily.  30 tablet  3  . furosemide (LASIX) 40 MG tablet Take 1 tablet (40 mg total) by mouth daily.  30 tablet  3  . hydrALAZINE (APRESOLINE) 25 MG tablet Take 1 tablet (25 mg total) by mouth 3 (three) times daily.  90 tablet  2  . metoprolol (LOPRESSOR) 100 MG tablet Take 1 tablet (100 mg total) by mouth 2 (two) times daily.  60 tablet  3  . potassium chloride (K-DUR,KLOR-CON) 10 MEQ tablet Take 1 tablet (10 mEq total) by mouth daily.  30 tablet  3   No current facility-administered medications for this visit.    Allergies:   No Known Allergies  Social History:  The patient  reports that he has never smoked. He has never used smokeless tobacco. He reports that he drinks alcohol. He reports that he does not use illicit drugs.   Family History:  The patient's family history is not on file.   ROS:  Please see the history of present illness.      All other systems reviewed and  negative.   PHYSICAL EXAM: VS:  BP 122/96  Pulse 60  Ht 6\' 1"  (1.854 m)  Wt 222 lb (100.699 kg)  BMI 29.30 kg/m2 Well nourished, well developed, in no acute distress HEENT: normal Neck: no JVD Cardiac:  normal S1, S2; RRR; no murmur Lungs:  clear to auscultation bilaterally, no wheezing, rhonchi or rales Abd: soft, nontender, no hepatomegaly Ext: no edema Skin: warm and dry Neuro:  CNs 2-12 intact, no focal abnormalities noted  EKG:  NSR with LVH, QRS widening, T wave inversions c/w lateral ischemia  ASSESSMENT AND PLAN:  1. HTN still with elevated diastolic BP - continue amlodipine/Lotensin//Hydralazine/metoprolol - increase Hydralazine to 50mg   TID - BP check with nurse in 1 week 2. 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 Chronic combined systolic/diastolic CHF- appear euvolemic - continue Lasix 4. 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 Abnormal EKG most likely secondary to LVH from HTN - Lexiscan myoview to rule out ischemia  Followup with me in 6 months  Signed, Fransico Him, MD 03/02/2014 4:17 PM

## 2014-03-08 ENCOUNTER — Telehealth: Payer: Self-pay

## 2014-03-08 NOTE — Telephone Encounter (Signed)
Pt Arived for his B/P CK this AM Reading was 150/90

## 2014-03-09 ENCOUNTER — Encounter: Payer: Self-pay | Admitting: *Deleted

## 2014-03-09 ENCOUNTER — Ambulatory Visit (INDEPENDENT_AMBULATORY_CARE_PROVIDER_SITE_OTHER): Payer: No Typology Code available for payment source | Admitting: *Deleted

## 2014-03-09 VITALS — BP 154/90 | HR 72 | Ht 73.0 in | Wt 227.0 lb

## 2014-03-09 DIAGNOSIS — I10 Essential (primary) hypertension: Secondary | ICD-10-CM

## 2014-03-09 MED ORDER — DOXAZOSIN MESYLATE 2 MG PO TABS: ORAL_TABLET | ORAL | Status: DC

## 2014-03-09 NOTE — Progress Notes (Addendum)
1.) Reason for visit: BP check  2.) Name of MD requesting visit: Dr Fransico Him  3.) H&P:  Pt here for BP since hydralazine increased 03/02/14 from 25mg  tid to 50mg  tid                 4.)ROS related to problem: Pt denies any symptoms including lightheadedness or dizziness.            Per Dr Turner-add doxazosin 2mg  daily at bedtime..             Schedule renal artery ultrasound.             Return for BP check with nurse in 1 week.    5.)Assessment and plan per UL:AGTX above reviewed and agree with adding Doxazosin 2mg  at bedtime with repeat BP check in 1 week  Fransico Him, MD

## 2014-03-09 NOTE — Patient Instructions (Signed)
Start Doxazosin 2mg  daily at bedtime.  Your physician has requested that you have a renal artery duplex. During this test, an ultrasound is used to evaluate blood flow to the kidneys. Allow one hour for this exam. Do not eat after midnight the day before and avoid carbonated beverages. Take your medications as you usually do.  Your physician recommends that you schedule a follow-up appointment in: 1 week for a BP check with the nurse.

## 2014-03-10 ENCOUNTER — Encounter: Payer: Self-pay | Admitting: Internal Medicine

## 2014-03-10 ENCOUNTER — Ambulatory Visit (INDEPENDENT_AMBULATORY_CARE_PROVIDER_SITE_OTHER): Payer: No Typology Code available for payment source | Admitting: Internal Medicine

## 2014-03-10 ENCOUNTER — Ambulatory Visit (HOSPITAL_COMMUNITY): Payer: No Typology Code available for payment source | Attending: Internal Medicine | Admitting: Cardiology

## 2014-03-10 VITALS — BP 150/90 | HR 88 | Temp 98.0°F | Resp 20 | Ht 73.0 in | Wt 227.0 lb

## 2014-03-10 DIAGNOSIS — N508 Other specified disorders of male genital organs: Secondary | ICD-10-CM

## 2014-03-10 DIAGNOSIS — R05 Cough: Secondary | ICD-10-CM | POA: Insufficient documentation

## 2014-03-10 DIAGNOSIS — I10 Essential (primary) hypertension: Secondary | ICD-10-CM

## 2014-03-10 DIAGNOSIS — R059 Cough, unspecified: Secondary | ICD-10-CM | POA: Insufficient documentation

## 2014-03-10 DIAGNOSIS — N183 Chronic kidney disease, stage 3 unspecified: Secondary | ICD-10-CM

## 2014-03-10 DIAGNOSIS — Z0000 Encounter for general adult medical examination without abnormal findings: Secondary | ICD-10-CM

## 2014-03-10 DIAGNOSIS — N5089 Other specified disorders of the male genital organs: Secondary | ICD-10-CM

## 2014-03-10 NOTE — Progress Notes (Signed)
Renal artery duplex completed

## 2014-03-10 NOTE — Progress Notes (Signed)
Subjective:     Patient ID: Daniel Joseph, male   DOB: 1960-11-24, 53 y.o.   MRN: [id number]  HPI: Pt here today for follow up on HTN management.He was started on Hydralazine 25 mg TID less than 4 weeks ago. Pt was seen by Dr. [name] (Cardiology) for atrial mass and she has apparently assumed management for his HTN also. Pt expresses concern that mediations are being changed and added so rapidly and states that he has not picked up the Cardura that was added by Dr. [name]. He has however, taken the 50 mg of Hydralazine as we had discussed this as the next option for BP control if BP was still uncontrolled.   Pt states that he has been feeling better. Pt has not had a colonoscopy, last vision examination several years ago. Pt seen by Cardiology for evaluation and had EKG which shows possible lateral ischemia and LVH.    Review of Systems  Constitutional: Negative.   HENT: Negative.   Eyes: Negative.   Respiratory: Negative.   Cardiovascular: Negative.   Gastrointestinal: Negative.   Endocrine: Negative.   Genitourinary: Negative.   Skin: Negative.   Allergic/Immunologic: Negative.   Neurological: Negative.  Negative for dizziness.  Hematological: Negative.   Psychiatric/Behavioral: Negative.        Objective:   Physical Exam  Vitals reviewed. Constitutional: He is oriented to person, place, and time. He appears well-developed and well-nourished.  HENT:  Head: Atraumatic.  Eyes: Conjunctivae and EOM are normal. Pupils are equal, round, and reactive to light. No scleral icterus.  Neck: Normal range of motion. Neck supple.  Cardiovascular: Normal rate and regular rhythm.  Exam reveals no gallop and no friction rub.   No murmur heard. Pulmonary/Chest: Effort normal and breath sounds normal. He has no wheezes. He has no rales. He exhibits no tenderness.  Abdominal: Soft. Bowel sounds are normal. He exhibits no mass.  Musculoskeletal: Normal range of motion.  Neurological: He is  alert and oriented to person, place, and time.  Skin: Skin is warm and dry.  Psychiatric: He has a normal mood and affect. His behavior is normal. Judgment and thought content normal.  BP 150/90  Pulse 88  Temp(Src) 98 F (36.7 C) (Oral)  Resp 20  Ht 6\' 1"  (1.854 m)  Wt 227 lb (102.967 kg)  BMI 29.96 kg/m2      Assessment:     1. HTN: Pt has had a significant decrease in BP. I am in agreement that I would not titrate his medications any further at this time. I will speak with Dr. [name] about a single provider managing HTN in the best interest of the patient's care.   2. Annual Visit: Pt has a finding of testicular mass on palpation of the left testicle. Will refer for testicular ultrasound.   3. Proteinuria: Urine Protein/Cr ratio is .07 which is within normal range.  4. Scrotal Mass: refer for scrotal U/S  5. Risk of Colon Cancer: Pt is age 53 and at increased risk of Colon Cancer. Referral for screening colonoscopy placed.    6. Preventative Care: Lipid panel reviewed- no hyperlipidemia.  Plan:     1. HTN: Continue Current medication except for cardura. Will discuss with Dr. [name]  2. Scrotal mass: Will obtain Scrotal ultrasound  3. Preventative care: refer for screening Colonoscopy   RTC: 3 months

## 2014-03-11 ENCOUNTER — Telehealth: Payer: Self-pay

## 2014-03-11 ENCOUNTER — Encounter: Payer: Self-pay | Admitting: Internal Medicine

## 2014-03-11 NOTE — Telephone Encounter (Signed)
Pt's info has been faxed over to [facility].Per Dr's Request. Pt will be contacted as to when and where U/S will take place when referral has been recieved and reviewed.

## 2014-03-14 ENCOUNTER — Ambulatory Visit (HOSPITAL_COMMUNITY): Payer: No Typology Code available for payment source | Attending: Cardiovascular Disease | Admitting: Radiology

## 2014-03-14 VITALS — BP 166/114 | Ht 73.0 in | Wt 219.0 lb

## 2014-03-14 DIAGNOSIS — R0609 Other forms of dyspnea: Secondary | ICD-10-CM | POA: Insufficient documentation

## 2014-03-14 DIAGNOSIS — R1084 Generalized abdominal pain: Secondary | ICD-10-CM

## 2014-03-14 DIAGNOSIS — R0989 Other specified symptoms and signs involving the circulatory and respiratory systems: Secondary | ICD-10-CM | POA: Insufficient documentation

## 2014-03-14 DIAGNOSIS — R0602 Shortness of breath: Secondary | ICD-10-CM | POA: Insufficient documentation

## 2014-03-14 DIAGNOSIS — R42 Dizziness and giddiness: Secondary | ICD-10-CM | POA: Insufficient documentation

## 2014-03-14 DIAGNOSIS — R110 Nausea: Secondary | ICD-10-CM

## 2014-03-14 DIAGNOSIS — R9431 Abnormal electrocardiogram [ECG] [EKG]: Secondary | ICD-10-CM

## 2014-03-14 MED ORDER — TECHNETIUM TC 99M SESTAMIBI GENERIC - CARDIOLITE
10.0000 | Freq: Once | INTRAVENOUS | Status: AC | PRN
  Administered 2014-03-14: 10 via INTRAVENOUS

## 2014-03-14 MED ORDER — REGADENOSON 0.4 MG/5ML IV SOLN
0.4000 mg | Freq: Once | INTRAVENOUS | Status: AC
  Administered 2014-03-14: 0.4 mg via INTRAVENOUS

## 2014-03-14 MED ORDER — AMINOPHYLLINE 25 MG/ML IV SOLN
75.0000 mg | Freq: Two times a day (BID) | INTRAVENOUS | Status: DC | PRN
  Administered 2014-03-14: 75 mg via INTRAVENOUS

## 2014-03-14 MED ORDER — TECHNETIUM TC 99M SESTAMIBI GENERIC - CARDIOLITE
30.0000 | Freq: Once | INTRAVENOUS | Status: AC | PRN
  Administered 2014-03-14: 30 via INTRAVENOUS

## 2014-03-14 NOTE — Progress Notes (Signed)
[facility] [address] [place], [place] [address] [phone]    Cardiology Nuclear Med Study  AVEON COLQUHOUN is a 53 y.o. male     MRN : [id number]     DOB: November 05, 1960  Procedure Date: 03/14/2014  Nuclear Med Background Indication for Stress Test:  Evaluation for Ischemia, and Patient seen in hospital on 01-19-2014 for Dyspnea, Abnormal EKG, CHF, and BP 188/132,Enzymes negative History:  No H/O CAD  01/25/14 TEE EF: 35-40% Cardiac Risk Factors: Strong Premature Family History - CAD, Hypertension and Lipids  Symptoms:  Dizziness, DOE and SOB   Nuclear Pre-Procedure Caffeine/Decaff Intake:  Joseph > 12 hrs NPO After: 8:00pm   Lungs:  clear O2 Sat: 98% on room air. IV 0.9% NS with Angio Cath:  22g  IV Site: R Hand x 1, tolerated well IV Started by:  Irven Baltimore, RN  Chest Size (in):  44 Cup Size: n/a  Height: 6\' 1"  (1.854 m)  Weight:  219 lb (99.338 kg)  BMI:  Body mass index is 28.9 kg/(m^2). Tech Comments:  Patient took Amlodipine, Benazepril, Hydralazine, and Lopressor this am. Irven Baltimore, RN. Aminophylline 150 mg given for symptoms. All were resolved before leaving,S.Williams EMTP    Nuclear Med Study 1 or 2 day study: 1 day  Stress Test Type:  Lexiscan  Reading MD: N/A  Order Authorizing Provider:  Fransico Him, MD  Resting Radionuclide: Technetium 93m Sestamibi  Resting Radionuclide Dose: 11.0 mCi   Stress Radionuclide:  Technetium 57m Sestamibi  Stress Radionuclide Dose: 33.0 mCi           Stress Protocol Rest HR: 64 Stress HR: 88  Rest BP: 116/114 Stress BP: 185/96  Exercise Time (min): n/a METS: n/a   Predicted Max HR: 170 bpm % Max HR: 51.76 bpm Rate Pressure Product: 16280   Dose of Adenosine (mg):  n/a Dose of Lexiscan: 0.4 mg  Dose of Atropine (mg): n/a Dose of Dobutamine: n/a mcg/kg/min (at max HR)  Stress Test Technologist: Perrin Maltese, EMT-P  Nuclear Technologist:  Charlton Amor, CNMT     Rest Procedure:   Myocardial perfusion imaging was performed at rest 45 minutes following the intravenous administration of Technetium 51m Sestamibi. Rest ECG: NSR - Normal EKG  Stress Procedure:  The patient received IV Lexiscan 0.4 mg over 15-seconds.  Technetium 65m Sestamibi injected at 30-seconds. This patient had extreme sob, headache, and abdominal pain with the Lexiscan injection. Quantitative spect images were obtained after a 45 minute delay. Stress ECG: No significant change from baseline ECG  QPS Raw Data Images:  Patient motion noted. Stress Images:  There is mild apical thinning with normal uptake in other regions. Rest Images:  There is mild apical thinning with normal uptake in other regions. Subtraction (SDS):  No evidence of ischemia. Transient Ischemic Dilatation (Normal <1.22):  0.99 Lung/Heart Ratio (Normal <0.45):  0.27  Quantitative Gated Spect Images QGS EDV:  252 ml QGS ESV:  160 ml  Impression Exercise Capacity:  Lexiscan with no exercise. BP Response:  Normal blood pressure response. Clinical Symptoms:  No chest pain. ECG Impression:  No significant ST segment change suggestive of ischemia. Comparison with Prior Nuclear Study: No images to compare  Overall Impression:  Intermediate risk stress nuclear study.  No convincing evidence of ischemia.  There is evidence of physiologic apical thinning present on stress and rest images (more prominent on stress images). There is significant LV dilatation with global hypokinesis.  LV Ejection  Fraction: 37%.  LV Wall Motion:  Global hypokinesis.  Darlin Coco MD

## 2014-03-16 ENCOUNTER — Ambulatory Visit (INDEPENDENT_AMBULATORY_CARE_PROVIDER_SITE_OTHER): Payer: No Typology Code available for payment source

## 2014-03-16 ENCOUNTER — Telehealth: Payer: Self-pay | Admitting: Internal Medicine

## 2014-03-16 VITALS — BP 159/104 | HR 117 | Ht 73.0 in | Wt 220.0 lb

## 2014-03-16 DIAGNOSIS — I10 Essential (primary) hypertension: Secondary | ICD-10-CM

## 2014-03-16 MED ORDER — DOXAZOSIN MESYLATE 4 MG PO TABS: ORAL_TABLET | ORAL | Status: DC

## 2014-03-16 NOTE — Progress Notes (Signed)
1.) Reason for visit: BP check  2.) Name of MD requesting visit: Dr [name]  3.) H&P: pt is here for a BP check since starting Doxazosin 2 mg at bedtime on 6/24  4.) ROS related to problem: Related to problem: pt denies headaches, lightheadedness and dizziness.  5.) Assessment and plan per MD: Per Dr [name] the pt is advised to increase Doxazosin to 4 mg at bedtime and to return to the office on 7/14 @ 9 am for BP check with nurse.  Note above reviewed and BP readings reviewed.  BP elevated but patient is upset about a friend who is very sick.  Will increase Doxazosin to 4mg  at bedtime and repeat BP check in 2 weeks.    Fransico Him, MD

## 2014-03-16 NOTE — Patient Instructions (Signed)
Your physician has recommended you make the following change in your medication: increase Doxazosin to 4 mg at bedtime  Your physician recommends that you schedule a follow-up appointment on: 7/14 @ 9 am for a BP check with nurse.

## 2014-03-29 ENCOUNTER — Ambulatory Visit (INDEPENDENT_AMBULATORY_CARE_PROVIDER_SITE_OTHER): Payer: No Typology Code available for payment source

## 2014-03-29 ENCOUNTER — Encounter: Payer: No Typology Code available for payment source | Admitting: Cardiology

## 2014-03-29 VITALS — BP 162/94 | HR 62

## 2014-03-29 DIAGNOSIS — I10 Essential (primary) hypertension: Secondary | ICD-10-CM

## 2014-03-29 MED ORDER — ISOSORBIDE MONONITRATE ER 30 MG PO TB24: 30.0000 mg | ORAL_TABLET | Freq: Every day | ORAL | Status: DC

## 2014-03-29 NOTE — Progress Notes (Signed)
1. Reason for visit: BP check 2. Name of MD requesting visit: Dr [name] 3. H&P: pt is here for a BP check since starting Doxazosin 4 mg at bedtime on 03/16/14 4. Pt came in on the phone today and took BP and it was 172/102 and Pulse 62. I told pt to take some time to relax and I would recheck. He stated he took all meds as prescribed at 7:30 this morning.  5. Plan: Went to DOD Dr Burt Knack and he asked I put pt back on IMDUR 30 MG 1 tablet daily. I sent into pharmacy for pt.   To Dr [name] to advise and let me know when you want pt back in for BP check since he does not have a BP cuff at home.

## 2014-03-30 ENCOUNTER — Telehealth: Payer: Self-pay

## 2014-03-30 ENCOUNTER — Telehealth: Payer: Self-pay | Admitting: Internal Medicine

## 2014-03-30 NOTE — Telephone Encounter (Signed)
Pt contacted office today complaining of tighting of the chest. Pt stated he had an increase of B/P med through his Dr. Fransico Him MD and believed that this is what could be the cause. Pt was Advised to go to ED if tightness cont. to worsen.Pt was also advised to contact Dr.Turner's office  To speak w/ Dr.'s Nsg to alert them of his issue.Pt stated at this time tightness was mild and that he would contact Dr.Turner's office to alert them of situation.

## 2014-03-30 NOTE — Telephone Encounter (Signed)
Patient calling stating he is having chest tightness since the RX for hydrALAZINE (APRESOLINE) 50 MG tablet  has been increased.

## 2014-03-31 NOTE — Progress Notes (Signed)
Pt is aware and set up for BP check tomorrow at 11:00am

## 2014-03-31 NOTE — Progress Notes (Signed)
Please have patient followup with BP check on Friday 7/17

## 2014-04-01 ENCOUNTER — Ambulatory Visit (INDEPENDENT_AMBULATORY_CARE_PROVIDER_SITE_OTHER): Payer: No Typology Code available for payment source

## 2014-04-01 VITALS — BP 139/90 | HR 62 | Ht 73.0 in | Wt 223.0 lb

## 2014-04-01 DIAGNOSIS — I10 Essential (primary) hypertension: Secondary | ICD-10-CM

## 2014-04-01 MED ORDER — HYDRALAZINE HCL 25 MG PO TABS: 25.0000 mg | ORAL_TABLET | Freq: Three times a day (TID) | ORAL | Status: DC

## 2014-04-01 MED ORDER — ASPIRIN EC 81 MG PO TBEC: 81.0000 mg | DELAYED_RELEASE_TABLET | Freq: Every day | ORAL | Status: DC

## 2014-04-01 MED ORDER — LABETALOL HCL 200 MG PO TABS: 200.0000 mg | ORAL_TABLET | Freq: Two times a day (BID) | ORAL | Status: DC

## 2014-04-01 NOTE — Progress Notes (Signed)
1.) Reason for visit: BP check  2.) Name of MD requesting visit: Dr. [name]  3.) H&P: The pt arrives in the office for a BP check since resuming Imdur 30 mg on 7/14. He states that he did take all of his medications except Hydrazine at 7:30 this am. He states that he did not take his Hydralazine dose because since it was increased to 50 mg TID on 6/17 he has been having chest tightness. He states that he has no chest tightness at this time because he did not take Hydralazine this am.  4.) ROS related to problem: The pt denies light headedness, dizziness, headaches, chest pain or tightness at this time.  5.) Assessment and plan per MD: Per Dr Caryl Comes (DOD) the pt is advised to decrease Aspirin to 81 mg daily, decrease Hydralazine to 25 mg TID, stop taking Metoprolol and to start taking Labetalol 200 mg BID. Also, Dr Caryl Comes recommended that the pt see his PCP next week concerning his BP. I did schedule an appointment for the pt to f/u with Dr [name] on  7/30 @ 10:15, the pt is aware and states that he will call the office if this appointment is not needed.

## 2014-04-12 ENCOUNTER — Encounter: Payer: Self-pay | Admitting: Cardiology

## 2014-04-12 ENCOUNTER — Ambulatory Visit (INDEPENDENT_AMBULATORY_CARE_PROVIDER_SITE_OTHER): Payer: No Typology Code available for payment source | Admitting: Cardiology

## 2014-04-12 VITALS — BP 161/97 | HR 66 | Ht 73.0 in | Wt 226.0 lb

## 2014-04-12 DIAGNOSIS — I369 Nonrheumatic tricuspid valve disorder, unspecified: Secondary | ICD-10-CM

## 2014-04-12 DIAGNOSIS — I10 Essential (primary) hypertension: Secondary | ICD-10-CM

## 2014-04-12 DIAGNOSIS — I509 Heart failure, unspecified: Secondary | ICD-10-CM

## 2014-04-12 DIAGNOSIS — I43 Cardiomyopathy in diseases classified elsewhere: Secondary | ICD-10-CM

## 2014-04-12 DIAGNOSIS — I119 Hypertensive heart disease without heart failure: Secondary | ICD-10-CM

## 2014-04-12 DIAGNOSIS — I5042 Chronic combined systolic (congestive) and diastolic (congestive) heart failure: Secondary | ICD-10-CM

## 2014-04-12 DIAGNOSIS — I078 Other rheumatic tricuspid valve diseases: Secondary | ICD-10-CM

## 2014-04-12 MED ORDER — DOXAZOSIN MESYLATE 4 MG PO TABS: 6.0000 mg | ORAL_TABLET | Freq: Every day | ORAL | Status: DC

## 2014-04-12 NOTE — Progress Notes (Signed)
[address], [place], [place]  [address] Phone: [phone] Fax:  [phone]  Date:  04/12/2014   ID:  [place] Joseph, DOB Jun 19, 1961, MRN [id number]  PCP:  MATTHEWS,MICHELLE A., MD  Cardiologist:  Fransico Him, MD     History of Present Illness: [place] Joseph is an 53 y.o. male with PMH of hypertension treated with 5 antihypertensive medications but who has was unable to obtain his medications for 2 months secondary to insurance issues, who was admitted 01/20/14 with a chief complaint of headache, dyspnea, blurred vision as well as orthopnea and paroxysmal nocturnal dyspnea. Upon initial valuation in the ER, he was found to have a pro BNP of 2268, a chest x-ray significant for cardiac enlargement with possible pulmonary edema, and EKG significant for nonspecific ST and T-wave changes, and a blood pressure of 188/138. 2-D echo, done 01/20/14 showed acute systolic and diastolic CHF with an EF of 73-71%, grade 2 diastolic dysfunction, and an abnormality on the tricuspid valve concerning for mass or vegetation. Cardiology consulted 01/21/14. He was placed on norvasc, Lasix, metoprolol, Imdur, Hydralazine and Benazepril. 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 He now presents for followup. He is doing well. He denies any chest pain or SOB unless he gets out in the very hot weather and then will have some chest tightness. He denies any LE edema. He denies any palpitations or syncope but gets dizzy in the heat.    Wt Readings from Last 3 Encounters:  04/12/14 226 lb (102.513 kg)  04/01/14 223 lb (101.152 kg)  03/16/14 220 lb (99.791 kg)     Past Medical History  Diagnosis Date  . Hypertension   . Mitral valve regurgitation 01/21/2014  . Systolic and diastolic CHF, acute 0/02/2693  . 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 Accelerated hypertension 01/19/2014  . Acute on chronic combined  systolic and diastolic CHF (congestive heart failure) 01/23/2014    Current Outpatient Prescriptions  Medication Sig Dispense Refill  . amLODipine (NORVASC) 10 MG tablet Take 1 tablet (10 mg total) by mouth daily.  30 tablet  3  . aspirin EC 81 MG tablet Take 1 tablet (81 mg total) by mouth daily.  90 tablet  3  . benazepril (LOTENSIN) 40 MG tablet Take 1 tablet (40 mg total) by mouth daily.  30 tablet  3  . doxazosin (CARDURA) 4 MG tablet 1 tablet at bedtime  30 tablet  2  . furosemide (LASIX) 40 MG tablet Take 1 tablet (40 mg total) by mouth daily.  30 tablet  3  . hydrALAZINE (APRESOLINE) 25 MG tablet Take 1 tablet (25 mg total) by mouth 3 (three) times daily.  90 tablet  3  . isosorbide mononitrate (IMDUR) 30 MG 24 hr tablet Take 1 tablet (30 mg total) by mouth daily.  90 tablet  3  . labetalol (NORMODYNE) 200 MG tablet Take 1 tablet (200 mg total) by mouth 2 (two) times daily.  60 tablet  3  . potassium chloride (K-DUR,KLOR-CON) 10 MEQ tablet Take 1 tablet (10 mEq total) by mouth daily.  30 tablet  3   No current facility-administered medications for this visit.    Allergies:   No Known Allergies  Social History:  The patient  reports that he has never smoked. He has never used smokeless tobacco. He reports that he drinks alcohol. He reports  that he does not use illicit drugs.   Family History:  The patient's family history is not on file.   ROS:  Please see the history of present illness.      All other systems reviewed and negative.   PHYSICAL EXAM: VS:  BP 161/97  Pulse 66  Ht 6\' 1"  (1.854 m)  Wt 226 lb (102.513 kg)  BMI 29.82 kg/m2 Well nourished, well developed, in no acute distress HEENT: normal Neck: no JVD Cardiac:  normal S1, S2; RRR; no murmur Lungs:  clear to auscultation bilaterally, no wheezing, rhonchi or rales Abd: soft, nontender, no hepatomegaly Ext: no edema Skin: warm and dry Neuro:  CNs 2-12 intact, no focal abnormalities noted   ASSESSMENT AND PLAN:    1. HTN still with elevated diastolic BP - continue amlodipine/Lotensin//Hydralazine/metoprolol/doxazosin - increase Doxazosin to 6mg  qhs - BP check with nurse in 1 week  2. 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 Chronic combined systolic/diastolic CHF- appear euvolemic - continue Lasix  4. 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 Abnormal EKG most likely secondary to LVH from HTN - no ischemia  Followup with me in 3 months       Signed, Fransico Him, MD 04/12/2014 9:10 AM

## 2014-04-12 NOTE — Patient Instructions (Signed)
Your physician has recommended you make the following change in your medication: 1. Increase Cardura to 6 MG daily. You can take 1.5 tablets of your 4 MG tablets  Your physician recommends that you schedule a follow-up appointment in: 1 Week with Nurse for BP check  Your physician recommends that you schedule a follow-up appointment in: 3 months with Dr [name]

## 2014-04-14 ENCOUNTER — Ambulatory Visit: Payer: No Typology Code available for payment source | Admitting: Cardiology

## 2014-04-15 ENCOUNTER — Telehealth: Payer: Self-pay | Admitting: Internal Medicine

## 2014-04-15 ENCOUNTER — Telehealth: Payer: Self-pay

## 2014-04-15 NOTE — Telephone Encounter (Signed)
Called and left message to return call just to notify patient of  u/s of scrotum. Dr. Zigmund Daniel states "that the mass felt is a stone and nothing to be concerned about". Thanks!

## 2014-06-13 ENCOUNTER — Ambulatory Visit: Payer: No Typology Code available for payment source | Admitting: Internal Medicine

## 2014-06-16 ENCOUNTER — Ambulatory Visit (INDEPENDENT_AMBULATORY_CARE_PROVIDER_SITE_OTHER): Payer: No Typology Code available for payment source | Admitting: Internal Medicine

## 2014-06-16 VITALS — BP 133/87 | HR 78 | Temp 98.1°F | Resp 18 | Ht 73.0 in | Wt 234.0 lb

## 2014-06-16 DIAGNOSIS — J069 Acute upper respiratory infection, unspecified: Secondary | ICD-10-CM

## 2014-06-16 DIAGNOSIS — R509 Fever, unspecified: Secondary | ICD-10-CM

## 2014-06-16 DIAGNOSIS — I10 Essential (primary) hypertension: Secondary | ICD-10-CM

## 2014-06-16 LAB — POCT INFLUENZA A/B
Influenza A, POC: NEGATIVE
Influenza B, POC: NEGATIVE

## 2014-06-20 ENCOUNTER — Other Ambulatory Visit: Payer: Self-pay | Admitting: *Deleted

## 2014-06-20 MED ORDER — POTASSIUM CHLORIDE CRYS ER 10 MEQ PO TBCR: 10.0000 meq | EXTENDED_RELEASE_TABLET | Freq: Every day | ORAL | Status: DC

## 2014-06-20 MED ORDER — FUROSEMIDE 40 MG PO TABS: 40.0000 mg | ORAL_TABLET | Freq: Every day | ORAL | Status: DC

## 2014-06-20 MED ORDER — BENAZEPRIL HCL 40 MG PO TABS: 40.0000 mg | ORAL_TABLET | Freq: Every day | ORAL | Status: DC

## 2014-06-20 MED ORDER — AMLODIPINE BESYLATE 10 MG PO TABS: 10.0000 mg | ORAL_TABLET | Freq: Every day | ORAL | Status: DC

## 2014-06-20 NOTE — Progress Notes (Signed)
Patient ID: Daniel Joseph, male   DOB: 07/16/1961, 53 y.o.   MRN: [id number]   Daniel Joseph, is a 53 y.o. male  OEV:[id number]  WEX:[id number]  DOB - [birth year]  CC:  Chief Complaint  Patient presents with  . Follow-up       HPI: Daniel Joseph is a 53 y.o. male here today to follow up on BP. He also has c/o upper respiratory symptoms which include non productive cough and rhinorrhea. He reports previous tactile fever and chills which are now resolved. He denies wheezing  or SOB.  He states that he presently feels well.   He was recently seen in the Cardiology clinic and had his BP medications adjusted. He denies dizziness with the recent medication adjustments unlike in the past. Pt voices a concern of his BP being managed by both myself and Dr. [name]. I have sent a message to Dr. [name] requesting that we discuss where this patient's medical home will reside so that only one clinical is managing each problem. I have discussed with the patient the feasibility of  Having his medical home with Cardiology as his major medical issues are the Tricuspid valve mass and HTN both of which are being managed by Dr. [name] and I will be happy to see him for health maintenance and acute medical issues. Pt is agreeable.  Patient has No headache, No chest pain, No abdominal pain - No Nausea, No new weakness tingling or numbness, No Cough - SOB.  No Known Allergies Past Medical History  Diagnosis Date  . Hypertension   . Mitral valve regurgitation 01/21/2014  . Systolic and diastolic CHF, acute 05/19/8100  . 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 Accelerated hypertension 01/19/2014  . Acute on chronic combined systolic and diastolic CHF (congestive heart failure) 01/23/2014   Current Outpatient Prescriptions on File Prior to Visit  Medication Sig Dispense Refill  . aspirin EC 81 MG tablet Take 1 tablet (81 mg total) by mouth daily.  90 tablet  3  . doxazosin (CARDURA) 4 MG tablet Take 1.5 tablets (6 mg  total) by mouth daily. 1 tablet at bedtime  45 tablet  3  . hydrALAZINE (APRESOLINE) 25 MG tablet Take 1 tablet (25 mg total) by mouth 3 (three) times daily.  90 tablet  3  . labetalol (NORMODYNE) 200 MG tablet Take 1 tablet (200 mg total) by mouth 2 (two) times daily.  60 tablet  3  . isosorbide mononitrate (IMDUR) 30 MG 24 hr tablet Take 1 tablet (30 mg total) by mouth daily.  90 tablet  3   No current facility-administered medications on file prior to visit.   No family history on file. History   Social History  . Marital Status: Single    Spouse Name: N/A    Number of Children: N/A  . Years of Education: N/A   Occupational History  . Not on file.   Social History Main Topics  . Smoking status: Never Smoker   . Smokeless tobacco: Never Used  . Alcohol Use: Yes     Comment: social  . Drug Use: No  . Sexual Activity: Not on file   Other Topics Concern  . Not on file   Social History Narrative  . No narrative on file    Review of Systems: Constitutional: Negative for fever, chills, diaphoresis, activity change, appetite change and fatigue. HENT: Negative for ear pain, nosebleeds,  facial swelling,  neck pain, neck stiffness and ear discharge.  Eyes: Negative for  pain, discharge, redness, itching and visual disturbance. Respiratory: Negative for choking, chest tightness, shortness of breath, wheezing and stridor.  Cardiovascular: Negative for chest pain, palpitations and leg swelling. Gastrointestinal: Negative for abdominal distention. Genitourinary: Negative for dysuria, urgency, frequency, hematuria, flank pain, decreased urine volume, difficulty urinating and dyspareunia.  Musculoskeletal: Negative for back pain, joint swelling, arthralgia and gait problem. Neurological: Negative for dizziness, tremors, seizures, syncope, facial asymmetry, speech difficulty, weakness, light-headedness, numbness and headaches.  Hematological: Negative for adenopathy. Does not  bruise/bleed easily. Psychiatric/Behavioral: Negative for hallucinations, behavioral problems, confusion, dysphoric mood, decreased concentration and agitation.    Objective:   Filed Vitals:   06/16/14 0852  BP: 133/87  Pulse: 78  Temp: 98.1 F (36.7 C)  Resp: 18    Physical Exam: Constitutional: Patient appears well-developed and well-nourished. No distress. HENT: Normocephalic, atraumatic, External right and left ear normal. Oropharynx is clear and moist. No sinus t Eyes: Conjunctivae and EOM are normal. PERRLA, no scleral icterus. Neck: Normal ROM. Neck supple. No JVD. No tracheal deviation. No thyromegaly. CVS: RRR, S1/S2 +, no murmurs, no gallops, no carotid bruit.  Pulmonary: Effort and breath sounds normal, no stridor, rhonchi, wheezes, rales.  Abdominal: Soft. BS +, no distension, tenderness, rebound or guarding.  Musculoskeletal: Normal range of motion. No edema and no tenderness.  Lymphadenopathy: No lymphadenopathy noted, cervical, inguinal or axillary Neuro: Alert. Normal reflexes, muscle tone coordination. No cranial nerve deficit. Skin: Skin is warm and dry. No rash noted. Not diaphoretic. No erythema. No pallor. Psychiatric: Normal mood and affect. Behavior, judgment, thought content normal.  Lab Results  Component Value Date   WBC 4.4 01/20/2014   HGB 14.5 01/20/2014   HCT 40.4 01/20/2014   MCV 88.4 01/20/2014   PLT 173 01/20/2014   Lab Results  Component Value Date   CREATININE 1.22 02/17/2014   BUN 17 02/17/2014   NA 138 02/17/2014   K 4.5 02/17/2014   CL 100 02/17/2014   CO2 30 02/17/2014    No results found for this basename: HGBA1C   Lipid Panel     Component Value Date/Time   CHOL 139 01/20/2014 0048   TRIG 63 01/20/2014 0048   HDL 41 01/20/2014 0048   CHOLHDL 3.4 01/20/2014 0048   VLDL 13 01/20/2014 0048   LDLCALC 85 01/20/2014 0048       Assessment and plan:   1. Fever and chills - Checked rapid flu which was negative. - Influenza A/B  2. Acute upper  respiratory infection - Likely viral in nature. Continue supportive care - Influenza A/B  3. Essential hypertension - BP medications adjusted by Dr. [name]. I will defer HTN management to Dr. [name] and he will follow with her for ongoing chronic management of HTN. - Basic Metabolic Panel  4. Colon Cancer Screening - referral has been made to GI for screening colonoscopy. Pt is awaiting availability via orange card benefit.   Follow-up in 3 months.  The patient was given clear instructions to go to ER or return to medical center if symptoms don't improve, worsen or new problems develop. The patient verbalized understanding. The patient was told to call to get lab results if they haven't heard anything in the next week.     This note has been created with Surveyor, quantity. Any transcriptional errors are unintentional.    Khyler Urda A., MD [place], [place]   06/20/2014, 12:20 PM

## 2014-07-05 ENCOUNTER — Encounter: Payer: Self-pay | Admitting: Nurse Practitioner

## 2014-07-05 ENCOUNTER — Ambulatory Visit (INDEPENDENT_AMBULATORY_CARE_PROVIDER_SITE_OTHER): Payer: No Typology Code available for payment source | Admitting: Cardiology

## 2014-07-05 ENCOUNTER — Encounter: Payer: Self-pay | Admitting: Cardiology

## 2014-07-05 VITALS — BP 182/112 | HR 83 | Ht 73.0 in | Wt 240.0 lb

## 2014-07-05 DIAGNOSIS — I10 Essential (primary) hypertension: Secondary | ICD-10-CM

## 2014-07-05 DIAGNOSIS — I5042 Chronic combined systolic (congestive) and diastolic (congestive) heart failure: Secondary | ICD-10-CM

## 2014-07-05 DIAGNOSIS — I420 Dilated cardiomyopathy: Secondary | ICD-10-CM

## 2014-07-05 DIAGNOSIS — I079 Rheumatic tricuspid valve disease, unspecified: Secondary | ICD-10-CM

## 2014-07-05 DIAGNOSIS — I078 Other rheumatic tricuspid valve diseases: Secondary | ICD-10-CM

## 2014-07-05 DIAGNOSIS — I429 Cardiomyopathy, unspecified: Secondary | ICD-10-CM

## 2014-07-05 MED ORDER — LABETALOL HCL 300 MG PO TABS: 300.0000 mg | ORAL_TABLET | Freq: Two times a day (BID) | ORAL | Status: DC

## 2014-07-05 NOTE — Patient Instructions (Addendum)
Your physician has recommended you make the following change in your medication:  INCREASE Labetalol to 300 mg twice daily  Your physician has requested that you have a TEE on Friday 12/4 with Dr. [name] at 10:00. During a TEE, sound waves are used to create images of your heart. It provides your doctor with information about the size and shape of your heart and how well your heart's chambers and valves are working. In this test, a transducer is attached to the end of a flexible tube that's guided down your throat and into your esophagus (the tube leading from you mouth to your stomach) to get a more detailed image of your heart. You are not awake for the procedure. Please see the instruction sheet given to you today. For further information please visit [email or web address].  Your physician recommends that you return for lab work on:  Monday 11/30 for CBC/BMET/PT  Your physician recommends that you schedule a follow-up appointment in: 1 week with Nurse visit for BP check Schedule a follow-up appointment with Dr. [name] in 3 months

## 2014-07-05 NOTE — Progress Notes (Signed)
[address], [place], [place]  [address] Phone: [phone] Fax:  [phone]  Date:  07/05/2014   ID:  Daniel Joseph, DOB May 28, 1961, MRN [id number]  PCP:  MATTHEWS,MICHELLE A., MD  Cardiologist:  Fransico Him, MD    History of Present Illness: Daniel Joseph is an 53 y.o. male with PMH of hypertension treated with 5 antihypertensive medications but who has was unable to obtain his medications for 2 months secondary to insurance issues, who was admitted 01/20/14 with a chief complaint of headache, dyspnea, blurred vision as well as orthopnea and paroxysmal nocturnal dyspnea. Upon initial valuation in the ER, he was found to have a pro BNP of 2268, a chest x-ray significant for cardiac enlargement with possible pulmonary edema, and EKG significant for nonspecific ST and T-wave changes, and a blood pressure of 188/138. 2-D echo, done 01/20/14 showed acute systolic and diastolic CHF with an EF of 74-25%, grade 2 diastolic dysfunction, and an abnormality on the tricuspid valve concerning for mass or vegetation. Cardiology consulted 01/21/14. He was placed on norvasc, Lasix, metoprolol, Imdur, Hydralazine and Benazepril. 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 He now presents for followup. He is doing well. He denies any SOB, LE edema. He denies any  syncope.  Occsaionally he will have some dizziness when bending over and standing back up.  He did have some left sided pinpoint discomfort yesterday that was sore to touch.  He denies any anginal pain.      Wt Readings from Last 3 Encounters:  07/05/14 240 lb (108.863 kg)  06/16/14 234 lb (106.142 kg)  04/12/14 226 lb (102.513 kg)     Past Medical History  Diagnosis Date  . Hypertension   . Mitral valve regurgitation 01/21/2014  . 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 Accelerated hypertension 01/19/2014  . Chronic combined systolic and  diastolic CHF (congestive heart failure) 01/21/2014  . 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 Cardiomyopathy, dilated, nonischemic     Current Outpatient Prescriptions  Medication Sig Dispense Refill  . amLODipine (NORVASC) 10 MG tablet Take 1 tablet (10 mg total) by mouth daily.  30 tablet  0  . aspirin EC 81 MG tablet Take 1 tablet (81 mg total) by mouth daily.  90 tablet  3  . benazepril (LOTENSIN) 40 MG tablet Take 1 tablet (40 mg total) by mouth daily.  30 tablet  0  . doxazosin (CARDURA) 4 MG tablet Take 1.5 tablets (6 mg total) by mouth daily. 1 tablet at bedtime  45 tablet  3  . furosemide (LASIX) 40 MG tablet Take 1 tablet (40 mg total) by mouth daily.  30 tablet  0  . hydrALAZINE (APRESOLINE) 25 MG tablet Take 1 tablet (25 mg total) by mouth 3 (three) times daily.  90 tablet  3  . isosorbide mononitrate (IMDUR) 30 MG 24 hr tablet Take 1 tablet (30 mg total) by mouth daily.  90 tablet  3  . labetalol (NORMODYNE) 200 MG tablet Take 1 tablet (200 mg total) by mouth 2 (two) times daily.  60 tablet  3  . potassium chloride (K-DUR,KLOR-CON) 10 MEQ tablet Take 1 tablet (10 mEq total) by mouth daily.  30 tablet  0   No current facility-administered medications for this visit.    Allergies:   No Known Allergies  Social History:  The patient  reports that he has never smoked. He has never used smokeless tobacco. He reports that he drinks alcohol. He reports that he does not use illicit drugs.   Family History:  The patient's family history is not on file.   ROS:  Please see the history of present illness.      All other systems reviewed and negative.   PHYSICAL EXAM:  VS: BP 161/97  Pulse 66  Ht 6\' 1"  (1.854 m)  Wt 226 lb (102.513 kg)  BMI 29.82 kg/m2  Well nourished, well developed, in no acute distress  HEENT: normal  Neck: no JVD  Cardiac: normal S1, S2; RRR; no murmur  Lungs: clear to auscultation bilaterally, no wheezing, rhonchi or rales  Abd: soft,  nontender, no hepatomegaly  Ext: no edema  Skin: warm and dry  Neuro: CNs 2-12 intact, no focal abnormalities noted    ASSESSMENT AND PLAN:  1. HTN still poorly controlled - continue amlodipine/Lotensin//Hydralazine/metoprolol/doxazosin  - increase Labetolol to 300mg  BID - we discussed the importance of a low sodium diet and avoiding fast food - BP check with nurse in 1 week  2. 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 Chronic combined systolic/diastolic CHF- appear euvolemic despite his weight increasing - continue Lasix  4. 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 Abnormal EKG most likely secondary to LVH from HTN - no ischemia on stress test  Followup with me in 3 months          Signed, Fransico Him, MD [facility] 07/05/2014 9:06 AM

## 2014-07-19 ENCOUNTER — Ambulatory Visit: Payer: No Typology Code available for payment source

## 2014-08-13 ENCOUNTER — Emergency Department (HOSPITAL_COMMUNITY)
Admission: EM | Admit: 2014-08-13 | Discharge: 2014-08-14 | Disposition: A | Discharge status: Home / Self Care | Payer: No Typology Code available for payment source | Attending: Emergency Medicine | Admitting: Emergency Medicine

## 2014-08-13 ENCOUNTER — Emergency Department (HOSPITAL_COMMUNITY): Payer: No Typology Code available for payment source

## 2014-08-13 ENCOUNTER — Encounter (HOSPITAL_COMMUNITY): Payer: Self-pay | Admitting: Emergency Medicine

## 2014-08-13 DIAGNOSIS — T1490XA Injury, unspecified, initial encounter: Secondary | ICD-10-CM

## 2014-08-13 DIAGNOSIS — Y9389 Activity, other specified: Secondary | ICD-10-CM | POA: Insufficient documentation

## 2014-08-13 DIAGNOSIS — Z79899 Other long term (current) drug therapy: Secondary | ICD-10-CM | POA: Insufficient documentation

## 2014-08-13 DIAGNOSIS — Z7982 Long term (current) use of aspirin: Secondary | ICD-10-CM | POA: Insufficient documentation

## 2014-08-13 DIAGNOSIS — I5042 Chronic combined systolic (congestive) and diastolic (congestive) heart failure: Secondary | ICD-10-CM | POA: Insufficient documentation

## 2014-08-13 DIAGNOSIS — S0101XA Laceration without foreign body of scalp, initial encounter: Secondary | ICD-10-CM

## 2014-08-13 DIAGNOSIS — Y998 Other external cause status: Secondary | ICD-10-CM | POA: Insufficient documentation

## 2014-08-13 DIAGNOSIS — Y9289 Other specified places as the place of occurrence of the external cause: Secondary | ICD-10-CM | POA: Insufficient documentation

## 2014-08-13 DIAGNOSIS — T148XXA Other injury of unspecified body region, initial encounter: Secondary | ICD-10-CM

## 2014-08-13 DIAGNOSIS — I10 Essential (primary) hypertension: Secondary | ICD-10-CM | POA: Insufficient documentation

## 2014-08-13 LAB — CBC WITH DIFFERENTIAL/PLATELET
BASOS ABS: 0 10*3/uL (ref 0.0–0.1)
Basophils Relative: 0 % (ref 0–1)
Eosinophils Absolute: 0.3 10*3/uL (ref 0.0–0.7)
Eosinophils Relative: 5 % (ref 0–5)
HCT: 38.4 % — ABNORMAL LOW (ref 39.0–52.0)
HEMOGLOBIN: 13.4 g/dL (ref 13.0–17.0)
Lymphocytes Relative: 45 % (ref 12–46)
Lymphs Abs: 2.1 10*3/uL (ref 0.7–4.0)
MCH: 31.3 pg (ref 26.0–34.0)
MCHC: 34.9 g/dL (ref 30.0–36.0)
MCV: 89.7 fL (ref 78.0–100.0)
Monocytes Absolute: 0.4 10*3/uL (ref 0.1–1.0)
Monocytes Relative: 9 % (ref 3–12)
NEUTROS ABS: 1.9 10*3/uL (ref 1.7–7.7)
NEUTROS PCT: 41 % — AB (ref 43–77)
Platelets: 166 10*3/uL (ref 150–400)
RBC: 4.28 MIL/uL (ref 4.22–5.81)
RDW: 12.8 % (ref 11.5–15.5)
WBC: 4.6 10*3/uL (ref 4.0–10.5)

## 2014-08-13 LAB — BASIC METABOLIC PANEL
Anion gap: 17 — ABNORMAL HIGH (ref 5–15)
BUN: 21 mg/dL (ref 6–23)
CHLORIDE: 101 meq/L (ref 96–112)
CO2: 21 mEq/L (ref 19–32)
Calcium: 9.2 mg/dL (ref 8.4–10.5)
Creatinine, Ser: 1.33 mg/dL (ref 0.50–1.35)
GFR calc non Af Amer: 61 mL/min — ABNORMAL LOW (ref >90)
GFR, EST AFRICAN AMERICAN: 71 mL/min — AB (ref >90)
Glucose, Bld: 94 mg/dL (ref 70–99)
POTASSIUM: 3.9 meq/L (ref 3.7–5.3)
SODIUM: 139 meq/L (ref 137–147)

## 2014-08-13 LAB — TYPE AND SCREEN
ABO/RH(D): B POS
ANTIBODY SCREEN: NEGATIVE
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
## 2014-08-13 NOTE — Discharge Instructions (Signed)
You had a scalp laceration that needed to be repaired with staples. Please see your doctor or urgent care for staples removal in 7-10 days.   Hematoma A hematoma is a collection of blood under the skin, in an organ, in a body space, in a joint space, or in other tissue. The blood can clot to form a lump that you can see and feel. The lump is often firm and may sometimes become sore and tender. Most hematomas get better in a few days to weeks. However, some hematomas may be serious and require medical care. Hematomas can range in size from very small to very large. CAUSES  A hematoma can be caused by a blunt or penetrating injury. It can also be caused by spontaneous leakage from a blood vessel under the skin. Spontaneous leakage from a blood vessel is more likely to occur in older people, especially those taking blood thinners. Sometimes, a hematoma can develop after certain medical procedures. SIGNS AND SYMPTOMS   A firm lump on the body.  Possible pain and tenderness in the area.  Bruising.Blue, dark blue, purple-red, or yellowish skin may appear at the site of the hematoma if the hematoma is close to the surface of the skin. For hematomas in deeper tissues or body spaces, the signs and symptoms may be subtle. For example, an intra-abdominal hematoma may cause abdominal pain, weakness, fainting, and shortness of breath. An intracranial hematoma may cause a headache or symptoms such as weakness, trouble speaking, or a change in consciousness. DIAGNOSIS  A hematoma can usually be diagnosed based on your medical history and a physical exam. Imaging tests may be needed if your health care provider suspects a hematoma in deeper tissues or body spaces, such as the abdomen, head, or chest. These tests may include ultrasonography or a CT scan.  TREATMENT  Hematomas usually go away on their own over time. Rarely does the blood need to be drained out of the body. Large hematomas or those that may affect  vital organs will sometimes need surgical drainage or monitoring. HOME CARE INSTRUCTIONS   Apply ice to the injured area:   Put ice in a plastic bag.   Place a towel between your skin and the bag.   Leave the ice on for 20 minutes, 2-3 times a day for the first 1 to 2 days.   After the first 2 days, switch to using warm compresses on the hematoma.   Elevate the injured area to help decrease pain and swelling. Wrapping the area with an elastic bandage may also be helpful. Compression helps to reduce swelling and promotes shrinking of the hematoma. Make sure the bandage is not wrapped too tight.   If your hematoma is on a lower extremity and is painful, crutches may be helpful for a couple days.   Only take over-the-counter or prescription medicines as directed by your health care provider. SEEK IMMEDIATE MEDICAL CARE IF:   You have increasing pain, or your pain is not controlled with medicine.   You have a fever.   You have worsening swelling or discoloration.   Your skin over the hematoma breaks or starts bleeding.   Your hematoma is in your chest or abdomen and you have weakness, shortness of breath, or a change in consciousness.  Your hematoma is on your scalp (caused by a fall or injury) and you have a worsening headache or a change in alertness or consciousness. MAKE SURE YOU:   Understand these instructions.  Will  watch your condition.  Will get help right away if you are not doing well or get worse. Document Released: 04/16/2004 Document Revised: 05/05/2013 Document Reviewed: 02/10/2013 [facility] Patient Information 2015 [place], [place]. This information is not intended to replace advice given to you by your health care provider. Make sure you discuss any questions you have with your health care provider. Please go to your Primary Care Physician, an Urgent Care or return to the Emergency Department to have your staples or sutures removed 7 -10 days from  today.

## 2014-08-13 NOTE — ED Notes (Signed)
Patient returned from CT

## 2014-08-14 LAB — ABO/RH: ABO/RH(D): B POS

## 2014-08-15 ENCOUNTER — Other Ambulatory Visit: Payer: No Typology Code available for payment source

## 2014-08-16 ENCOUNTER — Other Ambulatory Visit: Payer: Self-pay | Admitting: *Deleted

## 2014-08-16 ENCOUNTER — Telehealth: Payer: Self-pay | Admitting: Cardiology

## 2014-08-16 MED ORDER — AMLODIPINE BESYLATE 10 MG PO TABS: 10.0000 mg | ORAL_TABLET | Freq: Every day | ORAL | Status: DC

## 2014-08-16 MED ORDER — BENAZEPRIL HCL 40 MG PO TABS
40.0000 mg | ORAL_TABLET | Freq: Every day | ORAL | Status: DC
Start: 2014-08-16 — End: 2014-11-01

## 2014-08-16 MED ORDER — POTASSIUM CHLORIDE CRYS ER 10 MEQ PO TBCR: 10.0000 meq | EXTENDED_RELEASE_TABLET | Freq: Every day | ORAL | Status: DC

## 2014-08-16 MED ORDER — FUROSEMIDE 40 MG PO TABS: 40.0000 mg | ORAL_TABLET | Freq: Every day | ORAL | Status: DC
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
## 2014-08-16 NOTE — Telephone Encounter (Signed)
Please resechedule the TEE for 1 week

## 2014-08-16 NOTE — Telephone Encounter (Signed)
New message  Pt believes that his procedure is scheduled for 08/17/2014---The admissions tab states his surgery is on 12/04. Pt requests to have a call back to clarify the day time and location as to where is surgery is scheduled and if there are any special requirements.

## 2014-08-17 NOTE — Telephone Encounter (Signed)
Informed patient TEE is to be scheduled for next week.  Instructed patient he will be called tomorrow to make lab appointment and schedule TEE since ENDO is closed.  Patient agrees with treatment plan.

## 2014-08-18 NOTE — Telephone Encounter (Signed)
Please change to December 17th

## 2014-08-18 NOTE — Telephone Encounter (Signed)
TEE rescheduled for Friday, December 11 with Dr. Johnsie Cancel. Verified with OR scheduling.  Confirmed new lab appointment for Monday, Dec. 7th for pre-procedure labs.  Told patient to pick up instruction letter for TEE when he comes in for lab work on Monday.

## 2014-08-19 NOTE — Telephone Encounter (Signed)
Late entry from 12/3 1800:  Spoke with patient and informed him procedure is being moved to December 17.  Lab appointment rescheduled for Dec. 14.

## 2014-08-22 ENCOUNTER — Other Ambulatory Visit: Payer: Self-pay

## 2014-08-23 ENCOUNTER — Ambulatory Visit (INDEPENDENT_AMBULATORY_CARE_PROVIDER_SITE_OTHER): Payer: No Typology Code available for payment source | Admitting: Family Medicine

## 2014-08-23 VITALS — BP 162/98 | HR 84 | Temp 98.2°F | Resp 14 | Ht 73.0 in | Wt 236.0 lb

## 2014-08-23 DIAGNOSIS — R351 Nocturia: Secondary | ICD-10-CM

## 2014-08-23 DIAGNOSIS — Z4802 Encounter for removal of sutures: Secondary | ICD-10-CM | POA: Insufficient documentation

## 2014-08-23 DIAGNOSIS — I10 Essential (primary) hypertension: Secondary | ICD-10-CM
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
## 2014-08-24 ENCOUNTER — Telehealth: Payer: Self-pay | Admitting: Internal Medicine

## 2014-08-24 NOTE — Telephone Encounter (Signed)
Patient called requesting referral to the dentist.

## 2014-08-25 ENCOUNTER — Telehealth: Payer: Self-pay | Admitting: Cardiology

## 2014-08-25 ENCOUNTER — Encounter: Payer: Self-pay | Admitting: Family Medicine

## 2014-08-25 NOTE — Telephone Encounter (Signed)
New message      Talk to [facility].  Have questions regarding upcoming procedure

## 2014-08-26 NOTE — Telephone Encounter (Signed)
I spoke with the patient and answered his questions. 

## 2014-08-29 ENCOUNTER — Telehealth: Payer: Self-pay | Admitting: Cardiology

## 2014-08-29 ENCOUNTER — Telehealth: Payer: Self-pay

## 2014-08-29 ENCOUNTER — Other Ambulatory Visit (INDEPENDENT_AMBULATORY_CARE_PROVIDER_SITE_OTHER): Payer: Self-pay | Admitting: *Deleted

## 2014-08-29 DIAGNOSIS — I5042 Chronic combined systolic (congestive) and diastolic (congestive) heart failure: Secondary | ICD-10-CM

## 2014-08-29 DIAGNOSIS — I078 Other rheumatic tricuspid valve diseases: Secondary | ICD-10-CM

## 2014-08-29 DIAGNOSIS — I420 Dilated cardiomyopathy: Secondary | ICD-10-CM

## 2014-08-29 DIAGNOSIS — I079 Rheumatic tricuspid valve disease, unspecified: Secondary | ICD-10-CM

## 2014-08-29 DIAGNOSIS — I429 Cardiomyopathy, unspecified: Secondary | ICD-10-CM

## 2014-08-29 LAB — CBC WITH DIFFERENTIAL/PLATELET
Basophils Absolute: 0 10*3/uL (ref 0.0–0.1)
Basophils Relative: 0.4 % (ref 0.0–3.0)
EOS ABS: 0.3 10*3/uL (ref 0.0–0.7)
Eosinophils Relative: 6.8 % — ABNORMAL HIGH (ref 0.0–5.0)
HCT: 40.1 % (ref 39.0–52.0)
HEMOGLOBIN: 13.3 g/dL (ref 13.0–17.0)
LYMPHS PCT: 30.6 % (ref 12.0–46.0)
Lymphs Abs: 1.3 10*3/uL (ref 0.7–4.0)
MCHC: 33.2 g/dL (ref 30.0–36.0)
MCV: 93.7 fl (ref 78.0–100.0)
Monocytes Absolute: 0.5 10*3/uL (ref 0.1–1.0)
Monocytes Relative: 12 % (ref 3.0–12.0)
NEUTROS ABS: 2.2 10*3/uL (ref 1.4–7.7)
Neutrophils Relative %: 50.2 % (ref 43.0–77.0)
Platelets: 183 10*3/uL (ref 150.0–400.0)
RBC: 4.28 Mil/uL (ref 4.22–5.81)
RDW: 14.1 % (ref 11.5–15.5)
WBC: 4.4 10*3/uL (ref 4.0–10.5)

## 2014-08-29 LAB — BASIC METABOLIC PANEL
BUN: 23 mg/dL (ref 6–23)
CO2: 26 meq/L (ref 19–32)
CREATININE: 1.5 mg/dL (ref 0.4–1.5)
Calcium: 9.3 mg/dL (ref 8.4–10.5)
Chloride: 105 mEq/L (ref 96–112)
GFR: 63.95 mL/min (ref >60.00)
GLUCOSE: 93 mg/dL (ref 70–99)
Potassium: 3.6 mEq/L (ref 3.5–5.1)
SODIUM: 137 meq/L (ref 135–145)

## 2014-08-29 LAB — PROTIME-INR
INR: 1 ratio (ref 0.8–1.0)
Prothrombin Time: 10.9 s (ref 9.6–13.1)

## 2014-08-29 NOTE — Telephone Encounter (Signed)
Patient walked in to office for blood work. Walk in form received asking what medications patient should hold.  Informed patient that his instruction letter is at the front desk, and apologized that he did not receive it. Patient does not mind, and st he will come get it tomorrow. Instructed patient to call with any other questions.

## 2014-08-29 NOTE — Telephone Encounter (Signed)
Walk  In pt Form " Questions About Meds" gave to Daniel Joseph/KM

## 2014-08-31 ENCOUNTER — Telehealth: Payer: Self-pay

## 2014-08-31 DIAGNOSIS — I10 Essential (primary) hypertension: Secondary | ICD-10-CM

## 2014-08-31 MED ORDER — POTASSIUM CHLORIDE CRYS ER 20 MEQ PO TBCR: 20.0000 meq | EXTENDED_RELEASE_TABLET | Freq: Every day | ORAL | Status: DC

## 2014-08-31 NOTE — Telephone Encounter (Signed)
-----   Message from Sueanne Margarita, MD sent at 08/30/2014  8:03 AM EST ----- Increase Kdur to 44meq daily and recheck BMET 1 week

## 2014-08-31 NOTE — Telephone Encounter (Signed)
Instructed patient to INCREASE kdur to 20 meq daily. Repeat BMET scheduled for December 28.

## 2014-09-01 ENCOUNTER — Encounter (HOSPITAL_COMMUNITY): Payer: Self-pay | Admitting: *Deleted

## 2014-09-01 ENCOUNTER — Encounter (HOSPITAL_COMMUNITY)
Admission: RE | Disposition: A | Discharge status: Home / Self Care | Payer: Self-pay | Source: Ambulatory Visit | Attending: Cardiology

## 2014-09-01 ENCOUNTER — Ambulatory Visit (HOSPITAL_COMMUNITY)
Admission: RE | Admit: 2014-09-01 | Discharge: 2014-09-01 | Disposition: A | Discharge status: Home / Self Care | Payer: No Typology Code available for payment source | Source: Ambulatory Visit | Attending: Cardiology | Admitting: Cardiology

## 2014-09-01 DIAGNOSIS — I078 Other rheumatic tricuspid valve diseases: Secondary | ICD-10-CM | POA: Diagnosis present

## 2014-09-01 DIAGNOSIS — Z7982 Long term (current) use of aspirin: Secondary | ICD-10-CM | POA: Insufficient documentation

## 2014-09-01 DIAGNOSIS — I10 Essential (primary) hypertension: Secondary | ICD-10-CM

## 2014-09-01 DIAGNOSIS — I340 Nonrheumatic mitral (valve) insufficiency: Secondary | ICD-10-CM | POA: Diagnosis present

## 2014-09-01 DIAGNOSIS — I429 Cardiomyopathy, unspecified: Secondary | ICD-10-CM | POA: Insufficient documentation

## 2014-09-01 DIAGNOSIS — Z538 Procedure and treatment not carried out for other reasons: Secondary | ICD-10-CM | POA: Insufficient documentation

## 2014-09-01 SURGERY — CANCELLED PROCEDURE

## 2014-09-01 MED ORDER — HYDRALAZINE HCL 50 MG PO TABS: ORAL_TABLET | ORAL | Status: DC

## 2014-09-01 MED ORDER — SODIUM CHLORIDE 0.9 % IV SOLN
INTRAVENOUS | Status: DC
  Administered 2014-09-01: 09:00:00 via INTRAVENOUS

## 2014-09-01 NOTE — H&P (Signed)
 Expand All Collapse All    [address], [address] [place], [place] [address] Phone: [phone] Fax: [phone]  Date: 09/01/2014   ID: Daniel Joseph, DOB 01/14/1961, MRN [id number]  PCP: MATTHEWS,MICHELLE A., MD Cardiologist: Daniel Ord, MD  History of Present Illness: Daniel Joseph is an 52 y.o. male with PMH of hypertension treated with 5 antihypertensive medications but who has was unable to obtain his medications for 2 months secondary to insurance issues, who was admitted 01/20/14 with a chief complaint of headache, dyspnea, blurred vision as well as orthopnea and paroxysmal nocturnal dyspnea. Upon initial valuation in the ER, he was found to have a pro BNP of 2268, a chest x-ray significant for cardiac enlargement with possible pulmonary edema, and EKG significant for nonspecific ST and T-wave changes, and a blood pressure of 188/138. 2-D echo, done 01/20/14 showed acute systolic and diastolic CHF with an EF of 35-40%, grade 2 diastolic dysfunction, and an abnormality on the tricuspid valve concerning for mass or vegetation. Cardiology consulted 01/21/14. He was placed on norvasc, Lasix, metoprolol, Imdur, Hydralazine and Benazepril. 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 He now presents for followup TEE.    Wt Readings from Last 3 Encounters:  07/05/14 240 lb (108.863 kg)  06/16/14 234 lb (106.142 kg)  04/12/14 226 lb (102.513 kg)     Past Medical History  Diagnosis Date  . Hypertension   . Mitral valve regurgitation 01/21/2014  . 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 Accelerated hypertension 01/19/2014  . Chronic combined systolic and diastolic CHF (congestive heart failure) 01/21/2014  . 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 Cardiomyopathy, dilated, nonischemic     Current Outpatient  Prescriptions  Medication Sig Dispense Refill  . amLODipine (NORVASC) 10 MG tablet Take 1 tablet (10 mg total) by mouth daily. 30 tablet 0  . aspirin EC 81 MG tablet Take 1 tablet (81 mg total) by mouth daily. 90 tablet 3  . benazepril (LOTENSIN) 40 MG tablet Take 1 tablet (40 mg total) by mouth daily. 30 tablet 0  . doxazosin (CARDURA) 4 MG tablet Take 1.5 tablets (6 mg total) by mouth daily. 1 tablet at bedtime 45 tablet 3  . furosemide (LASIX) 40 MG tablet Take 1 tablet (40 mg total) by mouth daily. 30 tablet 0  . hydrALAZINE (APRESOLINE) 25 MG tablet Take 1 tablet (25 mg total) by mouth 3 (three) times daily. 90 tablet 3  . isosorbide mononitrate (IMDUR) 30 MG 24 hr tablet Take 1 tablet (30 mg total) by mouth daily. 90 tablet 3  . labetalol (NORMODYNE) 200 MG tablet Take 1 tablet (200 mg total) by mouth 2 (two) times daily. 60 tablet 3  . potassium chloride (K-DUR,KLOR-CON) 10 MEQ tablet Take 1 tablet (10 mEq total) by mouth daily. 30 tablet 0   No current facility-administered medications for this visit.    Allergies: No Known Allergies  Social History: The patient  reports that he has never smoked. He has never used smokeless tobacco. He reports that he drinks alcohol. He reports that he does not use illicit drugs.   Family History: The patient's family history is not on file.   ROS: Please see the history of present illness. All other systems reviewed and negative.   PHYSICAL EXAM:  VS: BP 161/97  Pulse 66  Ht 6'   1" (1.854 m)  Wt 226 lb (102.513 kg)  BMI 29.82 kg/m2  Well nourished, well developed, in no acute distress  HEENT: normal  Neck: no JVD  Cardiac: normal S1, S2; RRR; no murmur  Lungs: clear to auscultation bilaterally, no wheezing, rhonchi or rales  Abd: soft, nontender, no hepatomegaly  Ext: no edema  Skin: warm and dry  Neuro: CNs 2-12 intact, no focal abnormalities  noted    ASSESSMENT AND PLAN:  1. HTN  controlled - continue amlodipine/Lotensin//Hydralazine/metoprolol/doxazosin  - increase Labetolol to 300mg BID 2. TV mass felt to be a fibroelastoma - forTEE today 3. Chronic combined systolic/diastolic CHF- appear euvolemic  - continue Lasix  4. 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 Abnormal EKG most likely secondary to LVH from HTN - no ischemia on stress tes     Signed, Daniel Ion, MD [facility] 09/01/2014       

## 2014-09-01 NOTE — Interval H&P Note (Signed)
History and Physical Interval Note:  09/01/2014 9:56 AM  Daniel Joseph  has presented today for surgery, with the diagnosis of TRICEP VALUE MASS   The various methods of treatment have been discussed with the patient and family. After consideration of risks, benefits and other options for treatment, the patient has consented to  Procedure(s): TRANSESOPHAGEAL ECHOCARDIOGRAM (TEE) (N/A) as a surgical intervention .  The patient's history has been reviewed, patient examined, no change in status, stable for surgery.  I have reviewed the patient's chart and labs.  Questions were answered to the patient's satisfaction.     TURNER,TRACI R

## 2014-09-01 NOTE — CV Procedure (Signed)
Patient's BP markedly elevated today.  He did not take his Labetolol this am.  Last attempt at TEE resulted in severe elevation in BP.  His BP at home has been running 169/98-138mmHg at home.  I am cancelling TEE today and have instructed him to increase his Hydralazine to 50mg  TID.  He will be rescheduled for the last week of December and he has been instructed to take all his meds prior to coming to hospital.    Signed: Fransico Him, MD [facility] 09/01/2014

## 2014-09-12 ENCOUNTER — Other Ambulatory Visit: Payer: Self-pay

## 2014-09-12 NOTE — H&P (View-Only) (Signed)
Expand All Collapse All    [place] [place], [place] [address] Phone: [phone] Fax: [phone]  Date: 09/01/2014   ID: Daniel Joseph, DOB 08-20-1961, MRN [id number]  PCP: Daniel Joseph,Daniel A., MD Cardiologist: Fransico Him, MD  History of Present Illness: Daniel Joseph is an 53 y.o. male with PMH of hypertension treated with 5 antihypertensive medications but who has was unable to obtain his medications for 2 months secondary to insurance issues, who was admitted 01/20/14 with a chief complaint of headache, dyspnea, blurred vision as well as orthopnea and paroxysmal nocturnal dyspnea. Upon initial valuation in the ER, he was found to have a pro BNP of 2268, a chest x-ray significant for cardiac enlargement with possible pulmonary edema, and EKG significant for nonspecific ST and T-wave changes, and a blood pressure of 188/138. 2-D echo, done 01/20/14 showed acute systolic and diastolic CHF with an EF of 84-16%, grade 2 diastolic dysfunction, and an abnormality on the tricuspid valve concerning for mass or vegetation. Cardiology consulted 01/21/14. He was placed on norvasc, Lasix, metoprolol, Imdur, Hydralazine and Benazepril. 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 He now presents for followup TEE.    Wt Readings from Last 3 Encounters:  07/05/14 240 lb (108.863 kg)  06/16/14 234 lb (106.142 kg)  04/12/14 226 lb (102.513 kg)     Past Medical History  Diagnosis Date  . Hypertension   . Mitral valve regurgitation 01/21/2014  . 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 Accelerated hypertension 01/19/2014  . Chronic combined systolic and diastolic CHF (congestive heart failure) 01/21/2014  . 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 Cardiomyopathy, dilated, nonischemic     Current Outpatient  Prescriptions  Medication Sig Dispense Refill  . amLODipine (NORVASC) 10 MG tablet Take 1 tablet (10 mg total) by mouth daily. 30 tablet 0  . aspirin EC 81 MG tablet Take 1 tablet (81 mg total) by mouth daily. 90 tablet 3  . benazepril (LOTENSIN) 40 MG tablet Take 1 tablet (40 mg total) by mouth daily. 30 tablet 0  . doxazosin (CARDURA) 4 MG tablet Take 1.5 tablets (6 mg total) by mouth daily. 1 tablet at bedtime 45 tablet 3  . furosemide (LASIX) 40 MG tablet Take 1 tablet (40 mg total) by mouth daily. 30 tablet 0  . hydrALAZINE (APRESOLINE) 25 MG tablet Take 1 tablet (25 mg total) by mouth 3 (three) times daily. 90 tablet 3  . isosorbide mononitrate (IMDUR) 30 MG 24 hr tablet Take 1 tablet (30 mg total) by mouth daily. 90 tablet 3  . labetalol (NORMODYNE) 200 MG tablet Take 1 tablet (200 mg total) by mouth 2 (two) times daily. 60 tablet 3  . potassium chloride (K-DUR,KLOR-CON) 10 MEQ tablet Take 1 tablet (10 mEq total) by mouth daily. 30 tablet 0   No current facility-administered medications for this visit.    Allergies: No Known Allergies  Social History: The patient  reports that he has never smoked. He has never used smokeless tobacco. He reports that he drinks alcohol. He reports that he does not use illicit drugs.   Family History: The patient's family history is not on file.   ROS: Please see the history of present illness. All other systems reviewed and negative.   PHYSICAL EXAM:  VS: BP 161/97  Pulse 66  Ht 6'  1" (1.854 m)  Wt 226 lb (102.513 kg)  BMI 29.82 kg/m2  Well nourished, well developed, in no acute distress  HEENT: normal  Neck: no JVD  Cardiac: normal S1, S2; RRR; no murmur  Lungs: clear to auscultation bilaterally, no wheezing, rhonchi or rales  Abd: soft, nontender, no hepatomegaly  Ext: no edema  Skin: warm and dry  Neuro: CNs 2-12 intact, no focal abnormalities  noted    ASSESSMENT AND PLAN:  1. HTN  controlled - continue amlodipine/Lotensin//Hydralazine/metoprolol/doxazosin  - increase Labetolol to 300mg  BID 2. TV mass felt to be a fibroelastoma - forTEE today 3. Chronic combined systolic/diastolic CHF- appear euvolemic  - continue Lasix  4. 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 Abnormal EKG most likely secondary to LVH from HTN - no ischemia on stress tes     Signed, Fransico Him, MD [facility] 09/01/2014

## 2014-09-12 NOTE — Interval H&P Note (Signed)
History and Physical Interval Note:  09/12/2014 10:05 AM  Daniel Joseph  has presented today for surgery, with the diagnosis of Tricuspid Valve Mass  The various methods of treatment have been discussed with the patient and family. After consideration of risks, benefits and other options for treatment, the patient has consented to  Procedure(s): TRANSESOPHAGEAL ECHOCARDIOGRAM (TEE) (N/A) as a surgical intervention .  The patient's history has been reviewed, patient examined, no change in status, stable for surgery.  I have reviewed the patient's chart and labs.  Questions were answered to the patient's satisfaction.     Charlee Squibb R

## 2014-09-14 ENCOUNTER — Encounter (HOSPITAL_COMMUNITY)
Admission: RE | Disposition: A | Discharge status: Home / Self Care | Payer: Self-pay | Source: Ambulatory Visit | Attending: Cardiology

## 2014-09-14 ENCOUNTER — Ambulatory Visit (HOSPITAL_COMMUNITY)
Admission: RE | Admit: 2014-09-14 | Discharge: 2014-09-14 | Disposition: A | Discharge status: Home / Self Care | Payer: Self-pay | Source: Ambulatory Visit | Attending: Cardiology | Admitting: Cardiology

## 2014-09-14 DIAGNOSIS — Z7982 Long term (current) use of aspirin: Secondary | ICD-10-CM | POA: Insufficient documentation

## 2014-09-14 DIAGNOSIS — I340 Nonrheumatic mitral (valve) insufficiency: Secondary | ICD-10-CM

## 2014-09-14 DIAGNOSIS — I5042 Chronic combined systolic (congestive) and diastolic (congestive) heart failure: Secondary | ICD-10-CM | POA: Insufficient documentation

## 2014-09-14 DIAGNOSIS — I429 Cardiomyopathy, unspecified: Secondary | ICD-10-CM | POA: Insufficient documentation

## 2014-09-14 DIAGNOSIS — I10 Essential (primary) hypertension: Secondary | ICD-10-CM | POA: Insufficient documentation

## 2014-09-14 DIAGNOSIS — I078 Other rheumatic tricuspid valve diseases: Secondary | ICD-10-CM | POA: Diagnosis present

## 2014-09-14 HISTORY — PX: TEE WITHOUT CARDIOVERSION: SHX5443

## 2014-09-14 SURGERY — ECHOCARDIOGRAM, TRANSESOPHAGEAL
Anesthesia: Moderate Sedation

## 2014-09-14 MED ORDER — METOPROLOL TARTRATE 1 MG/ML IV SOLN
INTRAVENOUS | Status: DC | PRN
  Administered 2014-09-14 (×2): 2.5 mg via INTRAVENOUS

## 2014-09-14 MED ORDER — MIDAZOLAM HCL 5 MG/ML IJ SOLN
INTRAMUSCULAR | Status: AC
  Filled 2014-09-14: qty 2

## 2014-09-14 MED ORDER — METOPROLOL TARTRATE 1 MG/ML IV SOLN
INTRAVENOUS | Status: AC
  Filled 2014-09-14: qty 5

## 2014-09-14 MED ORDER — FENTANYL CITRATE 0.05 MG/ML IJ SOLN
INTRAMUSCULAR | Status: AC
  Filled 2014-09-14: qty 2

## 2014-09-14 MED ORDER — LIDOCAINE VISCOUS 2 % MT SOLN
OROMUCOSAL | Status: DC | PRN
Start: 2014-09-14 — End: 2014-09-14
  Administered 2014-09-14: 1 via OROMUCOSAL

## 2014-09-14 MED ORDER — HYDRALAZINE HCL 20 MG/ML IJ SOLN
INTRAMUSCULAR | Status: AC
  Filled 2014-09-14: qty 1

## 2014-09-14 MED ORDER — SODIUM CHLORIDE 0.9 % IV SOLN
INTRAVENOUS | Status: DC
  Administered 2014-09-14: 500 mL via INTRAVENOUS

## 2014-09-14 MED ORDER — BUTAMBEN-TETRACAINE-BENZOCAINE 2-2-14 % EX AERO
INHALATION_SPRAY | CUTANEOUS | Status: DC | PRN
  Administered 2014-09-14: 2 via TOPICAL

## 2014-09-14 MED ORDER — FENTANYL CITRATE 0.05 MG/ML IJ SOLN
INTRAMUSCULAR | Status: DC | PRN
  Administered 2014-09-14 (×3): 25 ug via INTRAVENOUS

## 2014-09-14 MED ORDER — LIDOCAINE VISCOUS 2 % MT SOLN
OROMUCOSAL | Status: AC
  Filled 2014-09-14: qty 15

## 2014-09-14 MED ORDER — MIDAZOLAM HCL 10 MG/2ML IJ SOLN
INTRAMUSCULAR | Status: DC | PRN
Start: 2014-09-14 — End: 2014-09-14
  Administered 2014-09-14: 1 mg via INTRAVENOUS
  Administered 2014-09-14 (×2): 2 mg via INTRAVENOUS
  Administered 2014-09-14: 1 mg via INTRAVENOUS

## 2014-09-14 NOTE — Interval H&P Note (Signed)
History and Physical Interval Note:  09/14/2014 10:16 AM  Daniel Joseph  has presented today for surgery, with the diagnosis of Tricuspid Valve Mass  The various methods of treatment have been discussed with the patient and family. After consideration of risks, benefits and other options for treatment, the patient has consented to  Procedure(s): TRANSESOPHAGEAL ECHOCARDIOGRAM (TEE) (N/A) as a surgical intervention .  The patient's history has been reviewed, patient examined, no change in status, stable for surgery.  I have reviewed the patient's chart and labs.  Questions were answered to the patient's satisfaction.     TURNER,TRACI R

## 2014-09-14 NOTE — Progress Notes (Signed)
  Echocardiogram Echocardiogram Transesophageal has been performed.  Roseanna Rainbow R 09/14/2014, 11:01 AM

## 2014-09-14 NOTE — CV Procedure (Addendum)
   PROCEDURE NOTE  Procedure:  Transesophageal echocardiogram Operator:  Fransico Him, MD Indications:  TV mass Complications:None IV Meds: Versed 6mg , Fentanyl 18mcg IV, Lopressor 5mg  IV  Results: Normal LV size and function Normal RV size and function Normal RA Normal LA and LAA Normal trileaflet AV Normal PV Normal MV with mild to moderate MR Normal interatrial septum by colorflow doppler Normal ascending and thoracic aorta There is a smooth spherical mass on the base of the tricupsid valve.  It is attached by a broad base. Differential includes myxoma or fibroelastoma.  Compared to the study of 01/2014 TEE, there is no change.  The patient tolerated the procedure well and was transferred back to his room in stable condition  Signed: Fransico Him, MD [facility] 09/14/2014

## 2014-09-14 NOTE — Discharge Instructions (Signed)
Transesophageal Echocardiogram °Transesophageal echocardiography (TEE) is a special type of test that produces images of the heart by using sound waves (echocardiogram). This type of echocardiography can obtain better images of the heart than standard echocardiography. TEE is done by passing a flexible tube down the esophagus. The heart is located in front of the esophagus. Because the heart and esophagus are close to one another, your health care provider can take very clear, detailed pictures of the heart via ultrasound waves. °TEE may be done: °· If your health care provider needs more information based on standard echocardiography findings. °· If you had a stroke. This might have happened because a clot formed in your heart. TEE can visualize different areas of the heart and check for clots. °· To check valve anatomy and function. °· To check for infection on the inside of your heart (endocarditis). °· To evaluate the dividing wall (septum) of the heart and presence of a hole that did not close after birth (patent foramen ovale or atrial septal defect). °· To help diagnose a tear in the wall of the aorta (aortic dissection). °· During cardiac valve surgery. This allows the surgeon to assess the valve repair before closing the chest. °· During a variety of other cardiac procedures to guide positioning of catheters. °· Sometimes before a cardioversion, which is a shock to convert heart rhythm back to normal. °LET YOUR HEALTH CARE PROVIDER KNOW ABOUT:  °· Any allergies you have. °· All medicines you are taking, including vitamins, herbs, eye drops, creams, and over-the-counter medicines. °· Previous problems you or members of your family have had with the use of anesthetics. °· Any blood disorders you have. °· Previous surgeries you have had. °· Medical conditions you have. °· Swallowing difficulties. °· An esophageal obstruction. °RISKS AND COMPLICATIONS  °Generally, TEE is a safe procedure. However, as with any  procedure, complications can occur. Possible complications include an esophageal tear (rupture). °BEFORE THE PROCEDURE  °· Do not eat or drink for 6 hours before the procedure or as directed by your health care provider. °· Arrange for someone to drive you home after the procedure. Do not drive yourself home. During the procedure, you will be given medicines that can continue to make you feel drowsy and can impair your reflexes. °· An IV access tube will be started in the arm. °PROCEDURE  °· A medicine to help you relax (sedative) will be given through the IV access tube. °· A medicine may be sprayed or gargled to numb the back of the throat. °· Your blood pressure, heart rate, and breathing (vital signs) will be monitored during the procedure. °· The TEE probe is a long, flexible tube. The tip of the probe is placed into the back of the mouth, and you will be asked to swallow. This helps to pass the tip of the probe into the esophagus. Once the tip of the probe is in the correct area, your health care provider can take pictures of the heart. °· TEE is usually not a painful procedure. You may feel the probe press against the back of the throat. The probe does not enter the trachea and does not affect your breathing. °AFTER THE PROCEDURE  °· You will be in bed, resting, until you have fully returned to consciousness. °· When you first awaken, your throat may feel slightly sore and will probably still feel numb. This will improve slowly over time. °· You will not be allowed to eat or drink until it   is clear that the numbness has improved. °· Once you have been able to drink, urinate, and sit on the edge of the bed without feeling sick to your stomach (nausea) or dizzy, you may be cleared to go home. °· You should have a friend or family member with you for the next 24 hours after your procedure. °Document Released: 11/23/2002 Document Revised: 09/07/2013 Document Reviewed: 03/04/2013 °[employer]® Patient Information  ©2015 [employer]. This information is not intended to replace advice given to you by your health care provider. Make sure you discuss any questions you have with your health care provider. ° °

## 2014-09-15 ENCOUNTER — Encounter (HOSPITAL_COMMUNITY): Payer: Self-pay | Admitting: Cardiology

## 2014-09-15 ENCOUNTER — Other Ambulatory Visit (INDEPENDENT_AMBULATORY_CARE_PROVIDER_SITE_OTHER): Payer: Self-pay | Admitting: *Deleted

## 2014-09-15 DIAGNOSIS — I10 Essential (primary) hypertension: Secondary | ICD-10-CM

## 2014-09-15 LAB — BASIC METABOLIC PANEL
BUN: 17 mg/dL (ref 6–23)
CO2: 24 mEq/L (ref 19–32)
Calcium: 9.2 mg/dL (ref 8.4–10.5)
Chloride: 107 mEq/L (ref 96–112)
Creatinine, Ser: 1.4 mg/dL (ref 0.4–1.5)
GFR: 70.44 mL/min (ref >60.00)
Glucose, Bld: 82 mg/dL (ref 70–99)
Potassium: 4.1 mEq/L (ref 3.5–5.1)
SODIUM: 138 meq/L (ref 135–145)

## 2014-10-09 NOTE — Progress Notes (Signed)
This encounter was created in error - please disregard.

## 2014-10-10 ENCOUNTER — Encounter: Payer: No Typology Code available for payment source | Admitting: Cardiology

## 2014-10-28 ENCOUNTER — Encounter: Payer: Self-pay | Admitting: Cardiology

## 2014-10-28 ENCOUNTER — Telehealth: Payer: Self-pay | Admitting: Cardiology

## 2014-10-28 ENCOUNTER — Ambulatory Visit (INDEPENDENT_AMBULATORY_CARE_PROVIDER_SITE_OTHER): Payer: Self-pay | Admitting: Cardiology

## 2014-10-28 VITALS — BP 142/90 | HR 66 | Ht 73.0 in | Wt 239.0 lb

## 2014-10-28 DIAGNOSIS — I10 Essential (primary) hypertension: Secondary | ICD-10-CM

## 2014-10-28 DIAGNOSIS — I5042 Chronic combined systolic (congestive) and diastolic (congestive) heart failure: Secondary | ICD-10-CM

## 2014-10-28 DIAGNOSIS — I079 Rheumatic tricuspid valve disease, unspecified: Secondary | ICD-10-CM

## 2014-10-28 DIAGNOSIS — I119 Hypertensive heart disease without heart failure: Secondary | ICD-10-CM

## 2014-10-28 DIAGNOSIS — R002 Palpitations: Secondary | ICD-10-CM

## 2014-10-28 DIAGNOSIS — I429 Cardiomyopathy, unspecified: Secondary | ICD-10-CM

## 2014-10-28 DIAGNOSIS — I43 Cardiomyopathy in diseases classified elsewhere: Secondary | ICD-10-CM

## 2014-10-28 DIAGNOSIS — I420 Dilated cardiomyopathy: Secondary | ICD-10-CM

## 2014-10-28 DIAGNOSIS — I078 Other rheumatic tricuspid valve diseases: Secondary | ICD-10-CM

## 2014-10-28 NOTE — Progress Notes (Addendum)
Cardiology Office Note   Date:  10/28/2014   ID:  Daniel Joseph, DOB 09/02/1961, MRN [id number]  PCP:  MATTHEWS,MICHELLE A., MD  Cardiologist:    Sueanne Margarita, MD   Chief Complaint  Patient presents with  . Hypertension  . Congestive Heart Failure  . Cardiomyopathy      History of Present Illness: Daniel Joseph is an 54 y.o. 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  He now presents for followup. He is doing well. He has had some problems with SOB recently and had some LE edema in the right big toe but that has resolved.  He denies any syncope. Occsaionally he will have some dizziness when bending over and standing back up.  He denies any anginal pain.  He had some problems with palpitations the other night that awakened him.  His pulse was fast and irregular.  .     Past Medical History  Diagnosis Date  . Hypertension   . Mitral valve regurgitation 01/21/2014  . 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 Accelerated hypertension 01/19/2014  . Chronic combined systolic and diastolic CHF (congestive heart failure) 01/21/2014  . 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 Cardiomyopathy, dilated, nonischemic     Past Surgical History  Procedure Laterality Date  . Abdominal surgery  at birth  . Tee without cardioversion N/A 01/25/2014    Procedure: TRANSESOPHAGEAL ECHOCARDIOGRAM (TEE);  Surgeon: [name], MD;  Location: [place];  Service: Cardiovascular;  Laterality: N/A;  . Tee without cardioversion N/A 09/14/2014    Procedure: TRANSESOPHAGEAL ECHOCARDIOGRAM (TEE);  Surgeon: Sueanne Margarita, MD;  Location: [facility] ENDOSCOPY;  Service: Cardiovascular;  Laterality: N/A;     Current Outpatient Prescriptions    Medication Sig Dispense Refill  . amLODipine (NORVASC) 10 MG tablet Take 1 tablet (10 mg total) by mouth daily. 30 tablet 1  . aspirin EC 81 MG tablet Take 1 tablet (81 mg total) by mouth daily. 90 tablet 3  . benazepril (LOTENSIN) 40 MG tablet Take 1 tablet (40 mg total) by mouth daily. 30 tablet 1  . doxazosin (CARDURA) 4 MG tablet Take 1.5 tablets (6 mg total) by mouth daily. 1 tablet at bedtime 45 tablet 3  . furosemide (LASIX) 40 MG tablet Take 1 tablet (40 mg total) by mouth daily. 30 tablet 1  . hydrALAZINE (APRESOLINE) 50 MG tablet Take 1 tablet (50mg  total) by mouth three times daily 180 tablet 3  . isosorbide mononitrate (IMDUR) 30 MG 24 hr tablet Take 1 tablet (30 mg total) by mouth daily. 90 tablet 3  . labetalol (NORMODYNE) 300 MG tablet Take 1 tablet (300 mg total) by mouth 2 (two) times daily. (Patient taking differently: Take 300 mg by mouth daily. ) 60 tablet 11  . potassium chloride SA (K-DUR,KLOR-CON) 20 MEQ tablet Take 1 tablet (20 mEq total) by mouth daily. 30 tablet 6   No current facility-administered medications for this visit.    Allergies:   Review of patient's allergies indicates no known allergies.    Social History:  The patient  reports that he has never smoked. He has never used smokeless tobacco. He reports that he drinks alcohol. He reports that he does not use illicit drugs.   Family  History:  The patient's family history includes Heart Problems in his brother, father, and mother.    ROS:  Please see the history of present illness.   Otherwise, review of systems are positive for none.   All other systems are reviewed and negative.    PHYSICAL EXAM: VS:  BP 142/90 mmHg  Pulse 66  Ht 6\' 1"  (1.854 m)  Wt 239 lb (108.41 kg)  BMI 31.54 kg/m2  SpO2 98% , BMI Body mass index is 31.54 kg/(m^2). GEN: Well nourished, well developed, in no acute distress HEENT: normal Neck: no JVD, carotid bruits, or masses Cardiac:RRR; no murmurs, rubs, or gallops,no edema   Respiratory:  clear to auscultation bilaterally, normal work of breathing GI: soft, nontender, nondistended, + BS MS: no deformity or atrophy Skin: warm and dry, no rash Neuro:  Strength and sensation are intact Psych: euthymic mood, full affect   EKG:  EKG is not ordered today.    Recent Labs: 01/20/2014: ALT 17 01/25/2014: Pro B Natriuretic peptide (BNP) 926.1* 02/17/2014: TSH 1.445 08/29/2014: Hemoglobin 13.3; Platelets 183.0 09/15/2014: BUN 17; Creatinine 1.4; Potassium 4.1; Sodium 138    Lipid Panel    Component Value Date/Time   CHOL 139 01/20/2014 0048   TRIG 63 01/20/2014 0048   HDL 41 01/20/2014 0048   CHOLHDL 3.4 01/20/2014 0048   VLDL 13 01/20/2014 0048   LDLCALC 85 01/20/2014 0048      Wt Readings from Last 3 Encounters:  10/28/14 239 lb (108.41 kg)  09/14/14 236 lb (107.049 kg)  08/23/14 236 lb (107.049 kg)    ASSESSMENT AND PLAN:  1. HTN borderline controlled - continue amlodipine/Lotensin//Hydralazine/metoprolol/doxazosin /labetolol - we discussed the importance of a low sodium diet and avoiding fast food  - I have asked him to check his BP daily for a week and call with the results 2. 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 Chronic combined systolic/diastolic CHF- appear euvolemic  - continue Lasix  4. 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 Abnormal EKG most likely secondary to LVH from HTN - no ischemia on stress test 6. 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 Palpitations - I will get an event monitor to rule out PAD      Current medicines are reviewed at length with the patient today.  The patient does not have concerns regarding medicines.  The following changes have been made:  no change  Labs/ tests ordered today include: None   Orders Placed This Encounter  Procedures  . Cardiac event monitor  . 2D Echocardiogram without contrast     Disposition:   FU with me in 6  months   Signed, Sueanne Margarita, MD  10/28/2014 2:37 PM    [facility] [address], [place], [place]  [address] Phone: [phone]; Fax: [phone]

## 2014-10-28 NOTE — Patient Instructions (Addendum)
Your physician has requested that you have an echocardiogram IN ONE YEAR. Echocardiography is a painless test that uses sound waves to create images of your heart. It provides your doctor with information about the size and shape of your heart and how well your heart's chambers and valves are working. This procedure takes approximately one hour. There are no restrictions for this procedure.  Please check your blood pressure daily for one week and call us with the results. [phone].  Your physician has recommended that you wear a 30-day event monitor. Event monitors are medical devices that record the heart's electrical activity. Doctors most often [place] these monitors to diagnose arrhythmias. Arrhythmias are problems with the speed or rhythm of the heartbeat. The monitor is a small, portable device. You can wear one while you do your normal daily activities. This is usually used to diagnose what is causing palpitations/syncope (passing out).  Your physician wants you to follow-up in: 6 months with Dr. [name]. You will receive a reminder letter in the mail two months in advance. If you don't receive a letter, please call our office to schedule the follow-up appointment.

## 2014-10-28 NOTE — Telephone Encounter (Signed)
Patient is interested in Cardiac Rehab for CHF.  He has medicaid so will need to document that he cannot exercise more than 5 mets.  Please set him up for ETT - do not hold meds.

## 2014-10-31 NOTE — Telephone Encounter (Signed)
Left message to call back  

## 2014-11-01 ENCOUNTER — Other Ambulatory Visit: Payer: Self-pay | Admitting: Cardiology

## 2014-11-01 ENCOUNTER — Telehealth: Payer: Self-pay | Admitting: Cardiology

## 2014-11-01 NOTE — Telephone Encounter (Signed)
Cancel appointment for monitor which was schedule for 11-02-14.  Patient is self pay and will need to fill out the [employer] Application.

## 2014-11-01 NOTE — Telephone Encounter (Signed)
Left message to call back  

## 2014-11-01 NOTE — Telephone Encounter (Signed)
Left message to call back. Hardship application obtained to send to patient.

## 2014-11-02 ENCOUNTER — Telehealth: Payer: Self-pay | Admitting: Cardiology

## 2014-11-02 NOTE — Telephone Encounter (Signed)
Patient in office. Informed patient of Dr. Theodosia Blender recommendation to have GXT in order to start Cardiac Rehab.  Instructed patient not to hold any meds. GXT ordered for scheduling.

## 2014-11-02 NOTE — Addendum Note (Signed)
Addended by: Harland German A on: 11/02/2014 09:26 AM   Modules accepted: Orders

## 2014-11-02 NOTE — Telephone Encounter (Signed)
Patient was giving the Application for Hardship for [employer].   Once application is return to me, I will fax to Godfrey Pick. @ [employer].   They will review and notify the patient once it has been approved or denied.  Also will notify Darrick Penna or Joellen Jersey of the decision.

## 2014-11-02 NOTE — Telephone Encounter (Signed)
Patient in office. Forms given to patient to complete.

## 2014-11-04 ENCOUNTER — Telehealth: Payer: Self-pay | Admitting: Cardiology

## 2014-11-04 NOTE — Telephone Encounter (Signed)
New message     Pt want Valetta Fuller to call him.  He would not tell me what he wanted

## 2014-11-04 NOTE — Telephone Encounter (Signed)
Left message to call back  

## 2014-11-07 NOTE — Telephone Encounter (Signed)
Left message to call back  

## 2014-11-09 NOTE — Telephone Encounter (Signed)
Automated message received that number is not accepting calls at this time.   Letter sent to patient to call the office.

## 2014-11-22 ENCOUNTER — Ambulatory Visit (INDEPENDENT_AMBULATORY_CARE_PROVIDER_SITE_OTHER): Payer: Self-pay | Admitting: Family Medicine

## 2014-11-22 VITALS — BP 148/94 | HR 86 | Temp 98.5°F | Resp 16 | Ht 73.0 in | Wt 238.0 lb

## 2014-11-22 DIAGNOSIS — J3089 Other allergic rhinitis: Secondary | ICD-10-CM

## 2014-11-22 DIAGNOSIS — I10 Essential (primary) hypertension: Secondary | ICD-10-CM

## 2014-11-22 DIAGNOSIS — J309 Allergic rhinitis, unspecified: Secondary | ICD-10-CM

## 2014-11-22 MED ORDER — CETIRIZINE HCL 10 MG PO TABS: 10.0000 mg | ORAL_TABLET | Freq: Every day | ORAL | Status: DC

## 2014-11-22 NOTE — Progress Notes (Signed)
Subjective:    Patient ID: Daniel Joseph, male    DOB: 1961-08-27, 54 y.o.   MRN: [id number]  HPI Daniel Joseph, 54 year old with a history of accelerated hypertension  is here for evaluation of possible allergic rhinitis. Patient's symptoms include clear rhinorrhea, headaches, itchy eyes, itchy nose, itchy palate, nasal congestion, postnasal drip and watery eyes. These symptoms are seasonal. Current triggers include exposure to pollens, dust, mold and weather changes. The patient has been suffering from these symptoms for approximately 3 days.  The patient has not attempted any over the counter interventions to alleviate symptoms. [name] denies fatigue, chest pains, shortness of breath, nausea, vomiting, or diarrhea.    Past Medical History  Diagnosis Date  . Hypertension   . Mitral valve regurgitation 01/21/2014  . 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 Accelerated hypertension 01/19/2014  . Chronic combined systolic and diastolic CHF (congestive heart failure) 01/21/2014  . 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 Cardiomyopathy, dilated, nonischemic    History   Social History  . Marital Status: Single    Spouse Name: N/A  . Number of Children: N/A  . Years of Education: N/A   Occupational History  . Not on file.   Social History Main Topics  . Smoking status: Never Smoker   . Smokeless tobacco: Never Used  . Alcohol Use: Yes     Comment: social  . Drug Use: No  . Sexual Activity: Not on file   Other Topics Concern  . Not on file   Social History Narrative   Review of Systems  Constitutional: Negative.   HENT: Negative.   Eyes: Positive for redness and itching.  Respiratory: Negative.   Cardiovascular: Negative.   Gastrointestinal: Negative.   Endocrine: Negative for polydipsia, polyphagia and polyuria.  Musculoskeletal: Negative.   Skin: Negative.   Allergic/Immunologic: Positive for environmental allergies.  Neurological: Negative for tremors,  weakness and numbness.  Hematological: Negative.   Psychiatric/Behavioral: Negative.        Objective:   Physical Exam  Constitutional: He is oriented to person, place, and time. He appears well-developed and well-nourished.  HENT:  Head: Normocephalic and atraumatic.  Right Ear: External ear normal.  Nose: Mucosal edema (boggy turbinates) present. Right sinus exhibits no maxillary sinus tenderness and no frontal sinus tenderness. Left sinus exhibits no maxillary sinus tenderness and no frontal sinus tenderness.  Mouth/Throat: Oropharynx is clear and moist. Mucous membranes are pale.  Eyes: Conjunctivae and EOM are normal. Pupils are equal, round, and reactive to light.  Neck: Normal range of motion. Neck supple.  Cardiovascular: Normal rate, regular rhythm, normal heart sounds and intact distal pulses.   Pulmonary/Chest: Effort normal and breath sounds normal.  Abdominal: Soft. Bowel sounds are normal.  Musculoskeletal: Normal range of motion.  Neurological: He is alert and oriented to person, place, and time. He has normal reflexes.  Skin: Skin is warm and dry.  Psychiatric: He has a normal mood and affect. His behavior is normal. Judgment and thought content normal.         BP 148/94 mmHg  Pulse 86  Temp(Src) 98.5 F (36.9 C) (Oral)  Resp 16  Ht 6\' 1"  (1.854 m)  Wt 238 lb (107.956 kg)  BMI 31.41 kg/m2 Assessment & Plan:  1. Environmental and seasonal allergies [name] complains of runny nose, itchy watery eyes, and nasal congestion. Recommend that he avoid allergens. Refrain from spending extended periods of time outside with  high pollen count.  - cetirizine (ZYRTEC) 10 MG tablet; Take 1 tablet (10 mg total) by mouth daily.  Dispense: 30 tablet; Refill: 3  2. Allergic rhinitis, unspecified allergic rhinitis type - cetirizine (ZYRTEC) 10 MG tablet; Take 1 tablet (10 mg total) by mouth daily.  Dispense: 30 tablet; Refill: 3  3. Essential hypertension Blood pressure  is managed by Dr. [name], cardiologist. He is schedule to follow up in 1 week for hypertension.    RTC: as needed

## 2014-11-22 NOTE — Patient Instructions (Signed)
Allergic Rhinitis Allergic rhinitis is when the mucous membranes in the nose respond to allergens. Allergens are particles in the air that cause your body to have an allergic reaction. This causes you to release allergic antibodies. Through a chain of events, these eventually cause you to release histamine into the blood stream. Although meant to protect the body, it is this release of histamine that causes your discomfort, such as frequent sneezing, congestion, and an itchy, runny nose.  CAUSES  Seasonal allergic rhinitis (hay fever) is caused by pollen allergens that may come from grasses, trees, and weeds. Year-round allergic rhinitis (perennial allergic rhinitis) is caused by allergens such as house dust mites, pet dander, and mold spores.  SYMPTOMS   Nasal stuffiness (congestion).  Itchy, runny nose with sneezing and tearing of the eyes. DIAGNOSIS  Your health care provider can help you determine the allergen or allergens that trigger your symptoms. If you and your health care provider are unable to determine the allergen, skin or blood testing may be used. TREATMENT  Allergic rhinitis does not have a cure, but it can be controlled by:  Medicines and allergy shots (immunotherapy).  Avoiding the allergen. Hay fever may often be treated with antihistamines in pill or nasal spray forms. Antihistamines block the effects of histamine. There are over-the-counter medicines that may help with nasal congestion and swelling around the eyes. Check with your health care provider before taking or giving this medicine.  If avoiding the allergen or the medicine prescribed do not work, there are many new medicines your health care provider can prescribe. Stronger medicine may be used if initial measures are ineffective. Desensitizing injections can be used if medicine and avoidance does not work. Desensitization is when a patient is given ongoing shots until the body becomes less sensitive to the allergen.  Make sure you follow up with your health care provider if problems continue. HOME CARE INSTRUCTIONS It is not possible to completely avoid allergens, but you can reduce your symptoms by taking steps to limit your exposure to them. It helps to know exactly what you are allergic to so that you can avoid your specific triggers. SEEK MEDICAL CARE IF:   You have a fever.  You develop a cough that does not stop easily (persistent).  You have shortness of breath.  You start wheezing.  Symptoms interfere with normal daily activities. Document Released: 05/28/2001 Document Revised: 09/07/2013 Document Reviewed: 05/10/2013 [facility] Patient Information 2015 [place], [place]. This information is not intended to replace advice given to you by your health care provider. Make sure you discuss any questions you have with your health care provider. Allergic Rhinitis Allergic rhinitis is when the mucous membranes in the nose respond to allergens. Allergens are particles in the air that cause your body to have an allergic reaction. This causes you to release allergic antibodies. Through a chain of events, these eventually cause you to release histamine into the blood stream. Although meant to protect the body, it is this release of histamine that causes your discomfort, such as frequent sneezing, congestion, and an itchy, runny nose.  CAUSES  Seasonal allergic rhinitis (hay fever) is caused by pollen allergens that may come from grasses, trees, and weeds. Year-round allergic rhinitis (perennial allergic rhinitis) is caused by allergens such as house dust mites, pet dander, and mold spores.  SYMPTOMS   Nasal stuffiness (congestion).  Itchy, runny nose with sneezing and tearing of the eyes. DIAGNOSIS  Your health care provider can help you determine the  allergen or allergens that trigger your symptoms. If you and your health care provider are unable to determine the allergen, skin or blood testing may be  used. TREATMENT  Allergic rhinitis does not have a cure, but it can be controlled by:  Medicines and allergy shots (immunotherapy).  Avoiding the allergen. Hay fever may often be treated with antihistamines in pill or nasal spray forms. Antihistamines block the effects of histamine. There are over-the-counter medicines that may help with nasal congestion and swelling around the eyes. Check with your health care provider before taking or giving this medicine.  If avoiding the allergen or the medicine prescribed do not work, there are many new medicines your health care provider can prescribe. Stronger medicine may be used if initial measures are ineffective. Desensitizing injections can be used if medicine and avoidance does not work. Desensitization is when a patient is given ongoing shots until the body becomes less sensitive to the allergen. Make sure you follow up with your health care provider if problems continue. HOME CARE INSTRUCTIONS It is not possible to completely avoid allergens, but you can reduce your symptoms by taking steps to limit your exposure to them. It helps to know exactly what you are allergic to so that you can avoid your specific triggers. SEEK MEDICAL CARE IF:   You have a fever.  You develop a cough that does not stop easily (persistent).  You have shortness of breath.  You start wheezing.  Symptoms interfere with normal daily activities. Document Released: 05/28/2001 Document Revised: 09/07/2013 Document Reviewed: 05/10/2013 [facility] Patient Information 2015 [place], [place]. This information is not intended to replace advice given to you by your health care provider. Make sure you discuss any questions you have with your health care provider.

## 2014-11-23 ENCOUNTER — Encounter: Payer: Self-pay | Admitting: Family Medicine

## 2014-11-24 ENCOUNTER — Encounter: Payer: Self-pay | Admitting: *Deleted

## 2014-11-24 ENCOUNTER — Encounter (INDEPENDENT_AMBULATORY_CARE_PROVIDER_SITE_OTHER): Payer: No Typology Code available for payment source

## 2014-11-24 DIAGNOSIS — R002 Palpitations: Secondary | ICD-10-CM

## 2014-11-24 NOTE — Progress Notes (Signed)
Patient ID: Daniel Joseph, male   DOB: March 30, 1961, 54 y.o.   MRN: [id number] [employer] 30 day cardiac event monitor applied to patient.

## 2014-12-05 ENCOUNTER — Telehealth: Payer: Self-pay | Admitting: Cardiology

## 2014-12-05 NOTE — Telephone Encounter (Signed)
New Msg ° ° ° ° ° ° °Pt returning call. ° ° °Please call back. °

## 2014-12-05 NOTE — Telephone Encounter (Signed)
Left message to call back  

## 2014-12-06 NOTE — Telephone Encounter (Signed)
Patient in office requesting written letter stating he is under Dr. Theodosia Blender care.  Letter written and given to patient. Instructed him to call if he needs anything else.

## 2014-12-08 ENCOUNTER — Ambulatory Visit (INDEPENDENT_AMBULATORY_CARE_PROVIDER_SITE_OTHER): Payer: No Typology Code available for payment source | Admitting: Physician Assistant

## 2014-12-08 DIAGNOSIS — I10 Essential (primary) hypertension: Secondary | ICD-10-CM

## 2014-12-08 DIAGNOSIS — R9431 Abnormal electrocardiogram [ECG] [EKG]: Secondary | ICD-10-CM

## 2014-12-08 DIAGNOSIS — I5042 Chronic combined systolic (congestive) and diastolic (congestive) heart failure: Secondary | ICD-10-CM

## 2014-12-08 MED ORDER — HYDRALAZINE HCL 50 MG PO TABS: 75.0000 mg | ORAL_TABLET | Freq: Three times a day (TID) | ORAL | Status: DC

## 2014-12-08 NOTE — Progress Notes (Signed)
Exercise Treadmill Test  Pre-Exercise Testing Evaluation Rhythm: normal sinus  Rate: 78 bpm     Test  Exercise Tolerance Test Ordering MD: Fransico Him, MD  Interpreting MD: Richardson Dopp, PA-C  Unique Test No: 1  Treadmill:  1  Indication for ETT: abn ekg  Contraindication to ETT: No   Stress Modality: exercise - treadmill  Cardiac Imaging Performed: non   Protocol: standard Bruce - maximal  Max BP:  210/114  Max MPHR (bpm):  169 85% MPR (bpm):  144  MPHR obtained (bpm):  126 % MPHR obtained:  74  Reached 85% MPHR (min:sec):  n/a Total Exercise Time (min-sec):  2:01  Workload in METS:  4.6 Borg Scale: 13  Reason ETT Terminated:  fatigue    ST Segment Analysis At Rest: normal ST segments - no evidence of significant ST depression With Exercise: no evidence of significant ST depression  Other Information Arrhythmia:  No Angina during ETT:  absent (0) Quality of ETT:  non-diagnostic  ETT Interpretation:  normal - no evidence of ischemia by ST analysis at submaximal exercise.  Comments: Poor exercise capacity.  Workload in METs:  4.6. No chest pain. Exaggerated BP response to exercise.  Baseline BP elevated (166/109). No ST changes to suggest ischemia at submaximal exercise.   Recommendations: BP uncontrolled. Increase Hydralazine 75 mg Three times a day  Return for BP check with RN in 1-2 weeks.   Bring machine to check accuracy. Consider Spironolactone if BP remains high (with close eye on Creatinine and K+ given CKD). FU with Dr. Fransico Him as planned. Signed,  Richardson Dopp, PA-C   12/08/2014 9:33 AM

## 2014-12-08 NOTE — Patient Instructions (Signed)
Increase Hydralazine to 75 mg Three times a day (take 1 and 1/2 tabs of the 50 mg tabs to = 75 mg).  Schedule a nurse visit for a BP check in 1-2 weeks.  Bring your machine with you to have it checked.  Check your BP once daily and bring in your list of readings to your nurse visit.

## 2014-12-14 ENCOUNTER — Telehealth: Payer: Self-pay | Admitting: Internal Medicine

## 2014-12-14 NOTE — Telephone Encounter (Signed)
Received correspondence from [place] requesting medical records. Forwarded to [employer].

## 2014-12-23 ENCOUNTER — Other Ambulatory Visit: Payer: Self-pay | Admitting: Cardiology

## 2014-12-26 ENCOUNTER — Ambulatory Visit (INDEPENDENT_AMBULATORY_CARE_PROVIDER_SITE_OTHER): Payer: No Typology Code available for payment source | Admitting: *Deleted

## 2014-12-26 VITALS — BP 155/98 | HR 63

## 2014-12-26 DIAGNOSIS — I10 Essential (primary) hypertension: Secondary | ICD-10-CM

## 2014-12-26 MED ORDER — HYDRALAZINE HCL 50 MG PO TABS
100.0000 mg | ORAL_TABLET | Freq: Three times a day (TID) | ORAL | Status: DC
Start: 2014-12-26 — End: 2016-03-22

## 2014-12-26 NOTE — Progress Notes (Signed)
Patient in for BP check. He has already taken medications this morning. He states he is congested nasal due to high pollen count but otherwise feels "pretty good". Denies headache, dizziness or CP at this time. 161/104, HR 68. Waited a few minutes and rechecked BP 155/98, HR 63. Reviewed by Richardson Dopp, PA. Adjustment made to medication. Patient will now increase Apresoline from 75 mg three times per day to Apresoline 100 mg three times per day. Patient needs to be seen in the next few weeks with a provider. Scheduled for Thursday, May 5th @11 :50 am with Richardson Dopp, PA. LM on VM with appt time/date. Patient had given permission to leave message.

## 2014-12-30 ENCOUNTER — Telehealth: Payer: Self-pay | Admitting: Cardiology

## 2014-12-30 DIAGNOSIS — R9431 Abnormal electrocardiogram [ECG] [EKG]: Secondary | ICD-10-CM

## 2014-12-30 NOTE — Telephone Encounter (Signed)
NSR, sinus tachycardia, PVC's with range 61-172bpm.  Most HRs were 60-110bpm.  There was a 12 beat run of wide complex tachycardia that was regular.

## 2015-01-02 NOTE — Telephone Encounter (Signed)
Please get a 24 hour holter to assess PVC load.  He had a submaximal ETT - please order a Lexiscan myoview to rule out ischemia.

## 2015-01-03 NOTE — Telephone Encounter (Signed)
Left message to call back  

## 2015-01-04 ENCOUNTER — Encounter: Payer: Self-pay | Admitting: Cardiology

## 2015-01-04 NOTE — Telephone Encounter (Signed)
This encounter was created in error - please disregard.

## 2015-01-04 NOTE — Telephone Encounter (Signed)
New message ° ° ° ° °Returning Katy's call °

## 2015-01-04 NOTE — Telephone Encounter (Signed)
Left message to call back  

## 2015-01-04 NOTE — Telephone Encounter (Signed)
Informed patient of results and verbal understanding expressed.  24 hour holter monitor ordered for scheduling. Lexiscan also ordered for scheduling.  Instructions reviewed. Patient to pick up instruction paperwork tomorrow. Patient agrees with treatment plan.

## 2015-01-04 NOTE — Addendum Note (Signed)
Addended by: Harland German A on: 01/04/2015 09:53 AM   Modules accepted: Orders

## 2015-01-13 ENCOUNTER — Telehealth: Payer: Self-pay | Admitting: Cardiology

## 2015-01-13 NOTE — Telephone Encounter (Signed)
New Message   Patient would like a call back from nurse. Patient did not want to say what , just wanted to talk to nurse. Please give patient a call.  Thanks

## 2015-01-13 NOTE — Telephone Encounter (Signed)
Left message to call back  

## 2015-01-16 ENCOUNTER — Telehealth (HOSPITAL_COMMUNITY): Payer: Self-pay | Admitting: *Deleted

## 2015-01-16 NOTE — Telephone Encounter (Signed)
Patient given detailed instructions per Myocardial Perfusion Study Information Sheet for test on 01/16/15 at 1220. Patient verbalized understanding. Caren Griffins Trevion Hoben,RN

## 2015-01-17 ENCOUNTER — Ambulatory Visit (HOSPITAL_COMMUNITY): Payer: No Typology Code available for payment source | Attending: Internal Medicine

## 2015-01-17 DIAGNOSIS — R9431 Abnormal electrocardiogram [ECG] [EKG]: Secondary | ICD-10-CM

## 2015-01-17 DIAGNOSIS — R9439 Abnormal result of other cardiovascular function study: Secondary | ICD-10-CM

## 2015-01-17 MED ORDER — REGADENOSON 0.4 MG/5ML IV SOLN
0.4000 mg | Freq: Once | INTRAVENOUS | Status: AC
  Administered 2015-01-17: 0.4 mg via INTRAVENOUS

## 2015-01-17 MED ORDER — TECHNETIUM TC 99M SESTAMIBI GENERIC - CARDIOLITE
11.0000 | Freq: Once | INTRAVENOUS | Status: AC | PRN
  Administered 2015-01-17: 11 via INTRAVENOUS

## 2015-01-17 MED ORDER — TECHNETIUM TC 99M SESTAMIBI GENERIC - CARDIOLITE
33.0000 | Freq: Once | INTRAVENOUS | Status: AC | PRN
  Administered 2015-01-17: 33 via INTRAVENOUS

## 2015-01-17 NOTE — Telephone Encounter (Signed)
[place] paperwork received and given to Dr. [name] for review.

## 2015-01-19 ENCOUNTER — Emergency Department (HOSPITAL_COMMUNITY)
Admission: EM | Admit: 2015-01-19 | Discharge: 2015-01-19 | Disposition: A | Discharge status: Home / Self Care | Payer: No Typology Code available for payment source | Attending: Emergency Medicine | Admitting: Emergency Medicine

## 2015-01-19 ENCOUNTER — Emergency Department (HOSPITAL_COMMUNITY): Payer: No Typology Code available for payment source

## 2015-01-19 ENCOUNTER — Encounter: Payer: No Typology Code available for payment source | Admitting: Physician Assistant

## 2015-01-19 ENCOUNTER — Other Ambulatory Visit: Payer: Self-pay

## 2015-01-19 ENCOUNTER — Telehealth: Payer: Self-pay | Admitting: Cardiology

## 2015-01-19 ENCOUNTER — Encounter (HOSPITAL_COMMUNITY): Payer: Self-pay | Admitting: Emergency Medicine

## 2015-01-19 DIAGNOSIS — I5042 Chronic combined systolic (congestive) and diastolic (congestive) heart failure: Secondary | ICD-10-CM | POA: Insufficient documentation

## 2015-01-19 DIAGNOSIS — Z7982 Long term (current) use of aspirin: Secondary | ICD-10-CM | POA: Insufficient documentation

## 2015-01-19 DIAGNOSIS — Z79899 Other long term (current) drug therapy: Secondary | ICD-10-CM | POA: Insufficient documentation

## 2015-01-19 DIAGNOSIS — R42 Dizziness and giddiness: Secondary | ICD-10-CM | POA: Insufficient documentation

## 2015-01-19 DIAGNOSIS — R0602 Shortness of breath: Secondary | ICD-10-CM | POA: Insufficient documentation

## 2015-01-19 DIAGNOSIS — R002 Palpitations: Secondary | ICD-10-CM

## 2015-01-19 DIAGNOSIS — R110 Nausea: Secondary | ICD-10-CM | POA: Insufficient documentation

## 2015-01-19 DIAGNOSIS — I10 Essential (primary) hypertension: Secondary | ICD-10-CM | POA: Insufficient documentation

## 2015-01-19 LAB — BASIC METABOLIC PANEL
Anion gap: 12 (ref 5–15)
BUN: 11 mg/dL (ref 6–20)
CHLORIDE: 106 mmol/L (ref 101–111)
CO2: 19 mmol/L — ABNORMAL LOW (ref 22–32)
CREATININE: 1.25 mg/dL — AB (ref 0.61–1.24)
Calcium: 9 mg/dL (ref 8.9–10.3)
GFR calc Af Amer: >60 mL/min (ref >60)
GFR calc non Af Amer: >60 mL/min (ref >60)
Glucose, Bld: 96 mg/dL (ref 70–99)
POTASSIUM: 4 mmol/L (ref 3.5–5.1)
Sodium: 137 mmol/L (ref 135–145)

## 2015-01-19 LAB — MYOCARDIAL PERFUSION IMAGING
CHL CUP NUCLEAR SRS: 2
CHL CUP STRESS STAGE 1 SBP: 162 mmHg
CHL CUP STRESS STAGE 3 SPEED: 0 mph
CHL CUP STRESS STAGE 4 DBP: 91 mmHg
CHL CUP STRESS STAGE 4 GRADE: 0 %
CHL CUP STRESS STAGE 5 DBP: 94 mmHg
CHL CUP STRESS STAGE 6 DBP: 95 mmHg
CHL CUP STRESS STAGE 6 SPEED: 0 mph
CSEPPHR: 84 {beats}/min
Estimated workload: 1 METS
LV sys vol: 141 mL
LVDIAVOL: 213 mL
Peak BP: 170 mmHg
Percent of predicted max HR: 49 %
RATE: 0.24
Rest HR: 65 {beats}/min
SDS: 3
SSS: 5
Stage 1 DBP: 114 mmHg
Stage 1 Grade: 0 %
Stage 1 HR: 62 {beats}/min
Stage 1 Speed: 0 mph
Stage 2 Grade: 0 %
Stage 2 HR: 62 {beats}/min
Stage 2 Speed: 0 mph
Stage 3 Grade: 0 %
Stage 3 HR: 75 {beats}/min
Stage 4 HR: 84 {beats}/min
Stage 4 SBP: 170 mmHg
Stage 4 Speed: 0 mph
Stage 5 Grade: 0 %
Stage 5 HR: 80 {beats}/min
Stage 5 SBP: 162 mmHg
Stage 5 Speed: 0 mph
Stage 6 Grade: 0 %
Stage 6 HR: 68 {beats}/min
Stage 6 SBP: 154 mmHg
TID: 0.99

## 2015-01-19 LAB — CBC
HEMATOCRIT: 42.1 % (ref 39.0–52.0)
Hemoglobin: 14.6 g/dL (ref 13.0–17.0)
MCH: 30.5 pg (ref 26.0–34.0)
MCHC: 34.7 g/dL (ref 30.0–36.0)
MCV: 88.1 fL (ref 78.0–100.0)
Platelets: 214 10*3/uL (ref 150–400)
RBC: 4.78 MIL/uL (ref 4.22–5.81)
RDW: 13.9 % (ref 11.5–15.5)
WBC: 4.1 10*3/uL (ref 4.0–10.5)

## 2015-01-19 LAB — I-STAT TROPONIN, ED: Troponin i, poc: 0.01 ng/mL (ref 0.00–0.08)

## 2015-01-19 LAB — BRAIN NATRIURETIC PEPTIDE: B NATRIURETIC PEPTIDE 5: 86.8 pg/mL (ref 0.0–100.0)

## 2015-01-19 NOTE — ED Notes (Signed)
Pt reporrts that he has a hx of CHF, reports having a stress test on Tuesday and was scheduled to meet w/ his Cardio MD today but did not go.  EMS gave 2 nitro and 324 of asp enroute, pain is now rated at a 3.

## 2015-01-19 NOTE — Progress Notes (Signed)
Cardiology Office Note   Date:  01/19/2015   ID:  Daniel Joseph, DOB [birth year], MRN [id number]  PCP:  MATTHEWS,MICHELLE A., MD  Cardiologist:  Dr. Fransico Him     Chief Complaint  Patient presents with  . Follow-up    Hypertension  . Cardiomyopathy     History of Present Illness: Daniel Joseph is a 54 y.o. 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 Initially seen in 01/2014. Echocardiogram demonstrated EF 35-40% and grade 2 diastolic dysfunction as well as abnormality tricuspid valve concerning for mass or vegetation. He is on multiple antihypertensive medications. 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 Repeat TEE in 08/2014 demonstrated stability of the abnormality tricuspid valve without evidence of enlargement. Last seen by Dr. [name] in 10/2014. Exercise treadmill test in 3/16 demonstrated no ischemic changes and exaggerated blood pressure response. Blood pressure medications a bit adjusted. The patient returns for further management of hypertension. Event monitor in 3/16 demonstrated sinus rhythm, sinus tachycardia, PVCs and it will be run of wide-complex tachycardia. Holter monitor was arranged as well as Lexiscan Myoview. 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  Holter monitors pending to assist PVC burden.    Studies/Reports Reviewed Today:  Nuclear 01/17/15 Study Impression: Myocardial perfusion is abnormal. This is an intermediate risk study. Overall left ventricular systolic function was abnormal. LV cavity size is mildly enlarged. The left ventricular ejection fraction is moderately decreased (30-44%).  Conclusion:   There is a small, moderately severe defect in the inferoapical region at stress and rest. 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 The SDS = 3. There is no reversible ischemia.  There is moderate global hypokinesis of the LV  TEE 09/14/14 - EF 40%to 45%. Wall motion was normal - Mitral valve: Trivial, late systolicprolapse, involving the anterior leaflet. There was mild to moderate regurgitation. - Left atrium: No evidence of thrombus in the atrial cavity or appendage. - Right atrium: No evidence of thrombus in the atrial cavity orappendage. 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 The density measures 1cm x 0.8cm in diameter. Compared to study of 01/2014, there is no change in the size or morphology. There was mild regurgitation.  Renal Artery [place] 6/15 Normal abdominal aorta.  Normal renal arteries bilaterally.     Past Medical History  Diagnosis Date  . Hypertension   . Mitral valve regurgitation 01/21/2014  . 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 Accelerated hypertension 01/19/2014  . Chronic combined systolic and diastolic CHF (congestive heart failure) 01/21/2014  . 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 Cardiomyopathy, dilated, nonischemic     Past Surgical History  Procedure Laterality Date  . Abdominal surgery  at birth  . Tee without cardioversion N/A 01/25/2014    Procedure: TRANSESOPHAGEAL ECHOCARDIOGRAM (TEE);  Surgeon: [name], MD;  Location: [place];  Service: Cardiovascular;  Laterality: N/A;  . Tee without cardioversion N/A 09/14/2014    Procedure: TRANSESOPHAGEAL ECHOCARDIOGRAM (TEE);  Surgeon: Sueanne Margarita, MD;  Location: [facility]COPY;  Service: Cardiovascular;  Laterality: N/A;     Current Outpatient Prescriptions  Medication Sig Dispense Refill  .  amLODipine (NORVASC) 10 MG tablet Take 1 tablet (10 mg total) by mouth daily. 30 tablet 1  . amLODipine (NORVASC) 10 MG tablet TAKE 1 TABLET BY MOUTH DAILY 30 tablet 3  .  aspirin EC 81 MG tablet Take 1 tablet (81 mg total) by mouth daily. 90 tablet 3  . benazepril (LOTENSIN) 40 MG tablet TAKE 1 TABLET BY MOUTH DAILY. 30 tablet 1  . cetirizine (ZYRTEC) 10 MG tablet Take 1 tablet (10 mg total) by mouth daily. 30 tablet 3  . doxazosin (CARDURA) 4 MG tablet Take 1.5 tablets (6 mg total) by mouth daily. 1 tablet at bedtime 45 tablet 3  . furosemide (LASIX) 40 MG tablet TAKE 1 TABLET BY MOUTH DAILY. 30 tablet 1  . hydrALAZINE (APRESOLINE) 50 MG tablet Take 2 tablets (100 mg total) by mouth 3 (three) times daily. 540 tablet 3  . isosorbide mononitrate (IMDUR) 30 MG 24 hr tablet Take 1 tablet (30 mg total) by mouth daily. 90 tablet 3  . labetalol (NORMODYNE) 300 MG tablet Take 1 tablet (300 mg total) by mouth 2 (two) times daily. (Patient taking differently: Take 300 mg by mouth daily. ) 60 tablet 11  . potassium chloride SA (K-DUR,KLOR-CON) 20 MEQ tablet Take 1 tablet (20 mEq total) by mouth daily. 30 tablet 6   No current facility-administered medications for this visit.    Allergies:   Review of patient's allergies indicates no known allergies.    Social History:  The patient  reports that he has never smoked. He has never used smokeless tobacco. He reports that he drinks alcohol. He reports that he does not use illicit drugs.   Family History:  The patient's family history includes Heart Problems in his brother, father, and mother.    ROS:   Please see the history of present illness.   ROS    PHYSICAL EXAM: VS:  There were no vitals taken for this visit.    Wt Readings from Last 3 Encounters:  01/17/15 237 lb (107.502 kg)  11/22/14 238 lb (107.956 kg)  10/28/14 239 lb (108.41 kg)     GEN: Well nourished, well developed, in no acute distress HEENT: normal Neck: no JVD, no carotid bruits, no masses Cardiac:  Normal S1/S2, RRR; no murmur ,  no rubs or gallops, no edema  Respiratory:  clear to auscultation bilaterally, no wheezing, rhonchi or  rales. GI: soft, nontender, nondistended, + BS MS: no deformity or atrophy Skin: warm and dry  Neuro:  CNs II-XII intact, Strength and sensation are intact Psych: Normal affect   EKG:  EKG is ordered today.  It demonstrates:      Recent Labs: 01/20/2014: ALT 17 01/25/2014: Pro B Natriuretic peptide (BNP) 926.1* 02/17/2014: TSH 1.445 08/29/2014: Hemoglobin 13.3; Platelets 183.0 09/15/2014: BUN 17; Creatinine 1.4; Potassium 4.1; Sodium 138    Lipid Panel    Component Value Date/Time   CHOL 139 01/20/2014 0048   TRIG 63 01/20/2014 0048   HDL 41 01/20/2014 0048   CHOLHDL 3.4 01/20/2014 0048   VLDL 13 01/20/2014 0048   LDLCALC 85 01/20/2014 0048      ASSESSMENT AND PLAN:  Hypertensive heart disease with heart failure  Chronic combined systolic and diastolic CHF (congestive heart failure)  Cardiomyopathy, dilated, nonischemic  Tricuspid valve mass  MVP (mitral valve prolapse)  Mitral regurgitation  PVC's (premature ventricular contractions)    Current medicines are reviewed at length with the patient today.  Concerns regarding medicines are as outlined above.  The  following changes have been made:       Labs/ tests ordered today include:  No orders of the defined types were placed in this encounter.    Disposition:   FU with    Signed, Richardson Dopp, PA-C, MHS 01/19/2015 8:27 AM    [facility] [address], [place], [place]  [id number] Phone: [phone]; Fax: [phone]    This encounter was created in error - please disregard.

## 2015-01-19 NOTE — Discharge Instructions (Signed)

## 2015-01-19 NOTE — ED Provider Notes (Signed)
CSN: [id number]     Arrival date & time 01/19/15  2006 History   First MD Initiated Contact with Patient 01/19/15 2031     Chief Complaint  Patient presents with  . Chest Pain  . Shortness of Breath     (Consider location/radiation/quality/duration/timing/severity/associated sxs/prior Treatment) Patient is a 54 y.o. male presenting with palpitations. The history is provided by the patient and a relative. No language interpreter was used.  Palpitations Palpitations quality:  Fast Onset quality:  Sudden Duration:  1 minute Timing:  Sporadic Progression:  Resolved Chronicity:  Recurrent Context: not anxiety   Relieved by:  Nothing Worsened by:  Nothing Ineffective treatments:  None tried Associated symptoms: chest pain, dizziness (lightheadedness), nausea and shortness of breath   Associated symptoms: no back pain, no chest pressure, no cough, no diaphoresis, no near-syncope, no numbness, no vomiting and no weakness   Chest pain:    Quality: sharp     Severity:  Moderate   Onset quality:  Sudden   Duration: 1 min or less.   Timing:  Sporadic   Progression:  Resolved   Chronicity:  Recurrent Dizziness:    Severity:  Mild   Timing:  Sporadic   Progression:  Resolved Nausea:    Severity:  Mild   Onset quality:  Sudden   Timing:  Sporadic   Progression:  Resolved Shortness of breath:    Severity:  Moderate   Onset quality:  Sudden   Timing:  Sporadic   Progression:  Resolved Risk factors: heart disease       Past Medical History  Diagnosis Date  . Hypertension   . Mitral valve regurgitation 01/21/2014  . 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 Accelerated hypertension 01/19/2014  . Chronic combined systolic and diastolic CHF (congestive heart failure) 01/21/2014  . 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 Cardiomyopathy, dilated, nonischemic    Past Surgical History  Procedure Laterality Date  . Abdominal surgery  at birth  . Tee without cardioversion N/A  01/25/2014    Procedure: TRANSESOPHAGEAL ECHOCARDIOGRAM (TEE);  Surgeon: [name], MD;  Location: [place];  Service: Cardiovascular;  Laterality: N/A;  . Tee without cardioversion N/A 09/14/2014    Procedure: TRANSESOPHAGEAL ECHOCARDIOGRAM (TEE);  Surgeon: Sueanne Margarita, MD;  Location: [facility] ENDOSCOPY;  Service: Cardiovascular;  Laterality: N/A;   Family History  Problem Relation Age of Onset  . Heart Problems Mother   . Heart Problems Father   . Heart Problems Brother    History  Substance Use Topics  . Smoking status: Never Smoker   . Smokeless tobacco: Never Used  . Alcohol Use: Yes     Comment: social    Review of Systems  Constitutional: Negative for fever, chills and diaphoresis.  HENT: Negative for congestion and rhinorrhea.   Respiratory: Positive for shortness of breath. Negative for cough, chest tightness, wheezing and stridor.   Cardiovascular: Positive for chest pain and palpitations. Negative for leg swelling and near-syncope.  Gastrointestinal: Positive for nausea. Negative for vomiting, diarrhea, constipation and abdominal distention.  Genitourinary: Negative for dysuria.  Musculoskeletal: Negative for back pain, neck pain and neck stiffness.  Skin: Negative for rash and wound.  Neurological: Positive for dizziness (lightheadedness). Negative for weakness, numbness and headaches.  Psychiatric/Behavioral: Negative for agitation.  All other systems reviewed and are negative.     Allergies  Review of patient's allergies indicates no known allergies.  Home Medications   Prior to Admission medications  Medication Sig Start Date End Date Taking? Authorizing Provider  amLODipine (NORVASC) 10 MG tablet TAKE 1 TABLET BY MOUTH DAILY 12/23/14  Yes Sueanne Margarita, MD  aspirin EC 81 MG tablet Take 1 tablet (81 mg total) by mouth daily. 04/01/14  Yes Deboraha Sprang, MD  benazepril (LOTENSIN) 40 MG tablet TAKE 1 TABLET BY MOUTH DAILY. 11/01/14  Yes Sueanne Margarita, MD   cetirizine (ZYRTEC) 10 MG tablet Take 1 tablet (10 mg total) by mouth daily. 11/22/14  Yes Dorena Dew, FNP  doxazosin (CARDURA) 4 MG tablet Take 1.5 tablets (6 mg total) by mouth daily. 1 tablet at bedtime 04/12/14  Yes Sueanne Margarita, MD  furosemide (LASIX) 40 MG tablet TAKE 1 TABLET BY MOUTH DAILY. 11/01/14  Yes Sueanne Margarita, MD  hydrALAZINE (APRESOLINE) 50 MG tablet Take 2 tablets (100 mg total) by mouth 3 (three) times daily. 12/26/14  Yes Scott T Kathlen Mody, PA-C  ibuprofen (ADVIL,MOTRIN) 200 MG tablet Take 400 mg by mouth every 6 (six) hours as needed.   Yes Historical Provider, MD  isosorbide mononitrate (IMDUR) 30 MG 24 hr tablet Take 1 tablet (30 mg total) by mouth daily. 03/29/14  Yes Sueanne Margarita, MD  labetalol (NORMODYNE) 300 MG tablet Take 1 tablet (300 mg total) by mouth 2 (two) times daily. Patient taking differently: Take 300 mg by mouth daily.  07/05/14  Yes Sueanne Margarita, MD  potassium chloride SA (K-DUR,KLOR-CON) 20 MEQ tablet Take 1 tablet (20 mEq total) by mouth daily. 08/31/14  Yes Sueanne Margarita, MD   BP 174/102 mmHg  Pulse 99  Temp(Src) 98.4 F (36.9 C) (Oral)  Ht 6\' 1"  (1.854 m)  Wt 238 lb (107.956 kg)  BMI 31.41 kg/m2  SpO2 100% Physical Exam  Constitutional: He is oriented to person, place, and time. He appears well-developed and well-nourished. No distress.  HENT:  Head: Normocephalic.  Mouth/Throat: No oropharyngeal exudate.  Eyes: Conjunctivae and EOM are normal. Pupils are equal, round, and reactive to light.  Neck: Normal range of motion.  Cardiovascular: Normal rate, regular rhythm, normal heart sounds and intact distal pulses.   No murmur heard. Pulmonary/Chest: Effort normal and breath sounds normal. No accessory muscle usage or stridor. No tachypnea and no bradypnea. No respiratory distress. He has no wheezes. He exhibits no tenderness.  Abdominal: Soft. There is no tenderness. There is no rebound and no guarding.  Musculoskeletal: He exhibits no  edema or tenderness.  Neurological: He is alert and oriented to person, place, and time. He exhibits normal muscle tone.  Skin: Skin is warm. No rash noted. He is not diaphoretic. No erythema.  Nursing note and vitals reviewed.   ED Course  Procedures (including critical care time) Labs Review Labs Reviewed  BASIC METABOLIC PANEL - Abnormal; Notable for the following:    CO2 19 (*)    Creatinine, Ser 1.25 (*)    All other components within normal limits  CBC  BRAIN NATRIURETIC PEPTIDE  I-STAT TROPOININ, ED    Imaging Review Dg Chest 2 View  01/19/2015   CLINICAL DATA:  Chest pain and shortness of breath  EXAM: CHEST  2 VIEW  COMPARISON:  01/19/2014  FINDINGS: Borderline cardiomegaly, with size decreased from previous. Aortic hilar contours are negative. There is no edema, consolidation, effusion, or pneumothorax. No osseous findings to explain chest pain.  IMPRESSION: Borderline cardiomegaly.  No pneumonia or edema.   Electronically Signed   By: Monte Fantasia M.D.   On:  01/19/2015 21:04     EKG Interpretation   Date/Time:  Thursday Jan 19 2015 20:09:07 EDT Ventricular Rate:  94 PR Interval:  153 QRS Duration: 99 QT Interval:  338 QTC Calculation: 423 R Axis:   0 Text Interpretation:  Sinus rhythm Probable left atrial enlargement Left  ventricular hypertrophy When compared with ECG of 01/19/2014, Nonspecific T  wave abnormality is no longer Present Confirmed by [facility]  MD, DAVID  ([id number]) on 01/19/2015 8:26:10 PM      MDM   Final diagnoses:  Palpitations   Daniel Joseph is a 54 y.o. male with a past medical history significant for CHF and HTN who presents with palpitations. The pt reported 3 episodes of less than 1 minute of palpitations. The pt reported sharp chest pain with it as well in shortness of breath, nausea, and lightheadedness. The pt reports similar symptoms in the past. The pt had a stress test several days PTA and was scheduled to see his cardiologist today.    The pt denies other symptoms and denies chest pain or palpitations on arrival.   On exam, the pt did not appear fluid overloaded with no leg edema and clear lungs. The pt had labs, EKG, and CXR obtained. The troponin was negative, CXR was improved, and the other labs were all improved from prior.   Given the normal labs, unlikely to be CHF exacerbation or ACS.   The pt did not have any more episodes during his workup in the ED and it was felt that the patient was appropriate for discharge with follow up in the AM with his cardiologist and to have a monitor placed to record any cardiac events. The cardiology team was sent a note and they will set up the monitor tomorrow. The pt was given return precautions for any new symptoms and voiced understanding of the plan of care. The pt had no other questions or concerns and was discharged in good condition.     This patient was seen with Dr. Roxanne Mins, Emergency Medicine attending.      Antony Blackbird, MD 81/82/99 [id number]  Delora Fuel, MD 96/78/93 [id number]

## 2015-01-19 NOTE — Telephone Encounter (Signed)
Patient was seen. 

## 2015-01-19 NOTE — Telephone Encounter (Signed)
Patient seen in ER by ER MD for palpitations and sharp CP that only occurred with palpitations.  Apparently palpitations similar to last month and only last less than a minute.  He had no arrhythmias in ER and was sent home.  Please have patient come in Friday 5/6 for another 30 day [employer] monitor.  I also would like him set up with Dr. Lovena Le who had already reviewed his prior heart monitor where he had one episode of WCT.  He has an EF of 35-40% by echo and recent nuclear showed no ischemia with a fixed defect in the inferoapex.  EF by nuclear was 41%.  I would like his seen by Dr. Lovena Le ASAP next week for further evaluation.

## 2015-01-20 NOTE — Telephone Encounter (Addendum)
Patient agrees with ASAP event monitor and OV with Dr. Lovena Le. Order and referral placed for scheduling.

## 2015-01-20 NOTE — Addendum Note (Signed)
Addended by: Harland German A on: 01/20/2015 04:56 PM   Modules accepted: Orders

## 2015-01-20 NOTE — Addendum Note (Signed)
Addended by: Harland German A on: 01/20/2015 01:12 PM   Modules accepted: Orders

## 2015-01-20 NOTE — Telephone Encounter (Signed)
Per Dr. [name], it is OK to wait for Dr. Tanna Furry assessment and hold off on event monitor at this time. Pt has an orange card at 50% and there is concern for pricing. GT OV scheduled for 5/12 at 0800.  Patient agrees with treatment plan.

## 2015-01-26 ENCOUNTER — Encounter: Payer: Self-pay | Admitting: Internal Medicine

## 2015-01-26 ENCOUNTER — Ambulatory Visit (INDEPENDENT_AMBULATORY_CARE_PROVIDER_SITE_OTHER): Payer: No Typology Code available for payment source | Admitting: Internal Medicine

## 2015-01-26 VITALS — BP 136/74 | HR 77 | Ht 73.0 in | Wt 243.2 lb

## 2015-01-26 DIAGNOSIS — I43 Cardiomyopathy in diseases classified elsewhere: Secondary | ICD-10-CM

## 2015-01-26 DIAGNOSIS — I5042 Chronic combined systolic (congestive) and diastolic (congestive) heart failure: Secondary | ICD-10-CM

## 2015-01-26 DIAGNOSIS — I472 Ventricular tachycardia: Secondary | ICD-10-CM

## 2015-01-26 DIAGNOSIS — I110 Hypertensive heart disease with heart failure: Secondary | ICD-10-CM

## 2015-01-26 DIAGNOSIS — I429 Cardiomyopathy, unspecified: Secondary | ICD-10-CM

## 2015-01-26 DIAGNOSIS — I4729 Other ventricular tachycardia: Secondary | ICD-10-CM

## 2015-01-26 NOTE — Patient Instructions (Signed)
Medication Instructions:  Your physician recommends that you continue on your current medications as directed. Please refer to the Current Medication list given to you today.   Labwork: NONE  Testing/Procedures: NONE  Follow-Up: Follow up with Dr. Taylor as needed.  Any Other Special Instructions Will Be Listed Below (If Applicable).   

## 2015-01-26 NOTE — Assessment & Plan Note (Signed)
His symptoms are class 2, almost class 3. He will continue his current meds. I have asked him to increase his physical activity and avoid sodium.

## 2015-01-26 NOTE — Assessment & Plan Note (Signed)
His blood pressure is fairly well controlled. I think this is his biggest problem. I encouraged a low sodium diet and not missing his meds. And exercise.

## 2015-01-26 NOTE — Progress Notes (Signed)
HPI Daniel Joseph is referred today by Dr. [name] for evaluation of VT. He is an 54 y.o. male with PMH of hypertension treated with 5 antihypertensive medications, chronic systolic and diastolic CHF with an EF of 97-98%, grade 2 diastolic dysfunction, and an abnormality on the tricuspid valve c/w myxoma vs. Fibroelastoma - repeat TEE recently showed no evidence of enlargement he has palpitations and wore a cardiac monitor demonstrating NSVT. He is referred for additional evaluation. He has never had syncope. He has episodic coughing resulting in dizziness. He has class 2b-3a dyspnea.  No Known Allergies   Current Outpatient Prescriptions  Medication Sig Dispense Refill  . amLODipine (NORVASC) 10 MG tablet TAKE 1 TABLET BY MOUTH DAILY 30 tablet 3  . aspirin EC 81 MG tablet Take 1 tablet (81 mg total) by mouth daily. 90 tablet 3  . benazepril (LOTENSIN) 40 MG tablet TAKE 1 TABLET BY MOUTH DAILY. 30 tablet 1  . cetirizine (ZYRTEC) 10 MG tablet Take 1 tablet (10 mg total) by mouth daily. 30 tablet 3  . doxazosin (CARDURA) 4 MG tablet Take 1.5 tablets (6 mg total) by mouth daily. 1 tablet at bedtime 45 tablet 3  . furosemide (LASIX) 40 MG tablet TAKE 1 TABLET BY MOUTH DAILY. 30 tablet 1  . hydrALAZINE (APRESOLINE) 50 MG tablet Take 2 tablets (100 mg total) by mouth 3 (three) times daily. 540 tablet 3  . ibuprofen (ADVIL,MOTRIN) 200 MG tablet Take 400 mg by mouth every 6 (six) hours as needed.    . isosorbide mononitrate (IMDUR) 30 MG 24 hr tablet Take 1 tablet (30 mg total) by mouth daily. 90 tablet 3  . labetalol (NORMODYNE) 300 MG tablet Take 1 tablet (300 mg total) by mouth 2 (two) times daily. (Patient taking differently: Take 300 mg by mouth daily. ) 60 tablet 11  . potassium chloride SA (K-DUR,KLOR-CON) 20 MEQ tablet Take 1 tablet (20 mEq total) by mouth daily. 30 tablet 6   No current facility-administered medications for this visit.     Past Medical History  Diagnosis Date  .  Hypertension   . Mitral valve regurgitation 01/21/2014  . 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 Accelerated hypertension 01/19/2014  . Chronic combined systolic and diastolic CHF (congestive heart failure) 01/21/2014  . 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 Cardiomyopathy, dilated, nonischemic     ROS:   All systems reviewed and negative except as noted in the HPI.   Past Surgical History  Procedure Laterality Date  . Abdominal surgery  at birth  . Tee without cardioversion N/A 01/25/2014    Procedure: TRANSESOPHAGEAL ECHOCARDIOGRAM (TEE);  Surgeon: [name], MD;  Location: [place];  Service: Cardiovascular;  Laterality: N/A;  . Tee without cardioversion N/A 09/14/2014    Procedure: TRANSESOPHAGEAL ECHOCARDIOGRAM (TEE);  Surgeon: Sueanne Margarita, MD;  Location: [facility] ENDOSCOPY;  Service: Cardiovascular;  Laterality: N/A;     Family History  Problem Relation Age of Onset  . Heart Problems Mother   . Heart Problems Father   . Heart Problems Brother      History   Social History  . Marital Status: Single    Spouse Name: N/A  . Number of Children: N/A  . Years of Education: N/A   Occupational History  . Not on file.   Social History Main Topics  . Smoking status: Never Smoker   . Smokeless tobacco: Never Used  . Alcohol Use: Yes  Comment: social  . Drug Use: No  . Sexual Activity: Not on file   Other Topics Concern  . Not on file   Social History Narrative     BP 136/74 mmHg  Pulse 77  Ht 6\' 1"  (1.854 m)  Wt 243 lb 3.2 oz (110.315 kg)  BMI 32.09 kg/m2  Physical Exam:  Well appearing middle aged man, NAD HEENT: Unremarkable Neck:  7 cm JVD, no thyromegally Lymphatics:  No adenopathy Back:  No CVA tenderness Lungs:  Clear with no wheezes HEART:  Regular rate rhythm, no murmurs, no rubs, no clicks, soft s4 Abd:  soft, positive bowel sounds, no organomegally, no rebound, no guarding Ext:  2 plus pulses, no edema, no cyanosis, no  clubbing Skin:  No rashes no nodules Neuro:  CN II through XII intact, motor grossly intact   DEVICE  Normal device function.  See PaceArt for details.   Assess/Plan:

## 2015-01-26 NOTE — Assessment & Plan Note (Signed)
He has a non-ischemic cm and NSVT. I have recommended her continue his current meds. I would not refer for ICD or EP study. Would repeat echo on a regular basis and consider ICD if EF drops below 35%.

## 2015-02-03 ENCOUNTER — Other Ambulatory Visit: Payer: Self-pay | Admitting: Family Medicine

## 2015-02-03 DIAGNOSIS — I10 Essential (primary) hypertension: Secondary | ICD-10-CM

## 2015-02-08 ENCOUNTER — Other Ambulatory Visit: Payer: Self-pay | Admitting: Cardiology

## 2015-02-16 ENCOUNTER — Telehealth: Payer: Self-pay | Admitting: *Deleted

## 2015-02-16 ENCOUNTER — Ambulatory Visit (INDEPENDENT_AMBULATORY_CARE_PROVIDER_SITE_OTHER): Payer: No Typology Code available for payment source | Admitting: Physician Assistant

## 2015-02-16 ENCOUNTER — Encounter: Payer: Self-pay | Admitting: Physician Assistant

## 2015-02-16 VITALS — BP 145/100 | HR 63 | Ht 73.0 in | Wt 240.0 lb

## 2015-02-16 DIAGNOSIS — I340 Nonrheumatic mitral (valve) insufficiency: Secondary | ICD-10-CM

## 2015-02-16 DIAGNOSIS — I5042 Chronic combined systolic (congestive) and diastolic (congestive) heart failure: Secondary | ICD-10-CM

## 2015-02-16 DIAGNOSIS — I472 Ventricular tachycardia: Secondary | ICD-10-CM

## 2015-02-16 DIAGNOSIS — I10 Essential (primary) hypertension: Secondary | ICD-10-CM

## 2015-02-16 DIAGNOSIS — I429 Cardiomyopathy, unspecified: Secondary | ICD-10-CM

## 2015-02-16 DIAGNOSIS — I079 Rheumatic tricuspid valve disease, unspecified: Secondary | ICD-10-CM

## 2015-02-16 DIAGNOSIS — I4729 Other ventricular tachycardia: Secondary | ICD-10-CM

## 2015-02-16 DIAGNOSIS — I078 Other rheumatic tricuspid valve diseases: Secondary | ICD-10-CM

## 2015-02-16 DIAGNOSIS — I420 Dilated cardiomyopathy: Secondary | ICD-10-CM

## 2015-02-16 DIAGNOSIS — R0683 Snoring: Secondary | ICD-10-CM

## 2015-02-16 DIAGNOSIS — N183 Chronic kidney disease, stage 3 unspecified: Secondary | ICD-10-CM

## 2015-02-16 DIAGNOSIS — I110 Hypertensive heart disease with heart failure: Secondary | ICD-10-CM

## 2015-02-16 DIAGNOSIS — I43 Cardiomyopathy in diseases classified elsewhere: Principal | ICD-10-CM

## 2015-02-16 LAB — BASIC METABOLIC PANEL
BUN: 20 mg/dL (ref 6–23)
CHLORIDE: 107 meq/L (ref 96–112)
CO2: 24 mEq/L (ref 19–32)
CREATININE: 1.46 mg/dL (ref 0.40–1.50)
Calcium: 9.4 mg/dL (ref 8.4–10.5)
GFR: 65.34 mL/min (ref >60.00)
Glucose, Bld: 85 mg/dL (ref 70–99)
Potassium: 4.3 mEq/L (ref 3.5–5.1)
Sodium: 137 mEq/L (ref 135–145)

## 2015-02-16 LAB — BRAIN NATRIURETIC PEPTIDE: PRO B NATRI PEPTIDE: 140 pg/mL — AB (ref 0.0–100.0)

## 2015-02-16 MED ORDER — CLONIDINE HCL 0.1 MG PO TABS: 0.1000 mg | ORAL_TABLET | Freq: Two times a day (BID) | ORAL | Status: DC

## 2015-02-16 NOTE — Patient Instructions (Signed)
Medication Instructions:  1. START CLONIDINE 0.1 MG 1 TABLET TWICE DAILY; RX SENT  Labwork: TODAY BMET, BNP  Testing/Procedures: 1. Your physician has requested that you have a renal artery duplex DX HTN; R/O RAS. During this test, an ultrasound is used to evaluate blood flow to the kidneys. Allow one hour for this exam. Do not eat after midnight the day before and avoid carbonated beverages. Take your medications as you usually do.  2. SPLIT NIGHT SLEEP STUDY; OUR OFFICE WILL CALL YOU WITH A DATE AND TIME   Follow-Up: YOU WILL NEED TO FOLLOW UP WITH DR. [name] IN 1 MONTH; ONCE I SPEAK WITH DR. Theodosia Blender NURSE I WILL CALL WITH AN APPT DATE AND TIME  Any Other Special Instructions Will Be Listed Below (If Applicable).

## 2015-02-16 NOTE — Telephone Encounter (Signed)
Left message to call back regarding a date for his sleep study

## 2015-02-16 NOTE — Progress Notes (Signed)
Cardiology Office Note   Date:  02/16/2015   ID:  Daniel Joseph, DOB November 10, 1960, MRN [id number]  PCP:  MATTHEWS,MICHELLE A., MD  Cardiologist:  Dr. Fransico Him   Electrophysiologist:  Dr. Cristopher Peru   Chief Complaint  Patient presents with  . Congestive Heart Failure  . Follow-up    Hypertension     History of Present Illness: Daniel Joseph is a 54 y.o. 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 Initially seen in 01/2014. Echocardiogram demonstrated EF 35-40% and grade 2 diastolic dysfunction as well as an abnormality on the tricuspid valve concerning for mass or vegetation. He is on multiple antihypertensive medications. 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 Repeat TEE in 08/2014 demonstrated stability of the abnormality on the tricuspid valve without evidence of enlargement. Last seen by Dr. [name] in 10/2014. Exercise treadmill test in 3/16 demonstrated no ischemic changes and exaggerated blood pressure response. Blood pressure medications were adjusted.  Event monitor in 3/16 demonstrated sinus rhythm, sinus tachycardia, PVCs and a run of wide-complex tachycardia. Holter monitor was arranged as well as Lexiscan Myoview. 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   Patient was evaluated by Dr. Lovena Le 01/26/15 for nonsustained ventricular tachycardia. ICD or EP study was not recommended. Follow-up echocardiograms are recommended with referral back to EP for ICD if EF dropped below 35%.  He returns for FU. He is overall doing ok.  He has chronic DOE.  It is overall improved.  He is NYHA 2b.  He is trying to exercise more.  He has chronic chest pain. This is intermittent and atypical without change over years. He denies orthopnea,  edema.  He denies syncope.  He does awaken at times with dyspnea (? Apnea).  He does snore and admits to fatigue.  He has a nonproductive cough.     Studies/Reports Reviewed Today:  Nuclear 01/17/15 Study Impression: Myocardial perfusion is abnormal. This is an intermediate risk study. Overall left ventricular systolic function was abnormal. LV cavity size is mildly enlarged. The left ventricular ejection fraction is moderately decreased (30-44%).  Conclusion:  There is a small, moderately severe defect in the inferoapical region at stress and rest. 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 The SDS = 3. There is no reversible ischemia. There is moderate global hypokinesis of the LV  TEE 09/14/14 - EF 40%to 45%. Wall motion was normal - Mitral valve: Trivial, late systolicprolapse, involving the anterior leaflet. There was mild to moderate regurgitation. - Left atrium: No evidence of thrombus in the atrial cavity or appendage. - Right atrium: No evidence of thrombus in the atrial cavity orappendage. 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 The density measures 1cm x 0.8cm in diameter. Compared to study of 01/2014, there is no change in the size or morphology. There was mild regurgitation.  Renal Artery [place] 6/15 Normal abdominal aorta. Normal renal arteries bilaterally.    Past Medical History  Diagnosis Date  . Hypertension   . Mitral valve regurgitation 01/21/2014  . 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 Accelerated hypertension 01/19/2014  . Chronic combined systolic and diastolic CHF (congestive heart failure) 01/21/2014  . 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 Cardiomyopathy, dilated, nonischemic     Past Surgical History  Procedure Laterality Date  . Abdominal surgery  at birth  . Tee without cardioversion N/A 01/25/2014    Procedure:  TRANSESOPHAGEAL ECHOCARDIOGRAM (TEE);  Surgeon: [name], MD;  Location: [place];  Service: Cardiovascular;  Laterality: N/A;  . Tee without cardioversion N/A 09/14/2014    Procedure: TRANSESOPHAGEAL ECHOCARDIOGRAM (TEE);  Surgeon: Sueanne Margarita, MD;  Location: [facility] ENDOSCOPY;  Service: Cardiovascular;  Laterality: N/A;     Current Outpatient Prescriptions  Medication Sig Dispense Refill  . amLODipine (NORVASC) 10 MG tablet TAKE 1 TABLET BY MOUTH DAILY 30 tablet 3  . aspirin EC 81 MG tablet Take 1 tablet (81 mg total) by mouth daily. 90 tablet 3  . benazepril (LOTENSIN) 40 MG tablet TAKE 1 TABLET BY MOUTH DAILY. 30 tablet 5  . cetirizine (ZYRTEC) 10 MG tablet Take 1 tablet (10 mg total) by mouth daily. 30 tablet 3  . doxazosin (CARDURA) 4 MG tablet Take 1.5 tablets (6 mg total) by mouth daily. 1 tablet at bedtime 45 tablet 3  . furosemide (LASIX) 40 MG tablet TAKE 1 TABLET BY MOUTH DAILY. 30 tablet 5  . hydrALAZINE (APRESOLINE) 50 MG tablet Take 2 tablets (100 mg total) by mouth 3 (three) times daily. 540 tablet 3  . ibuprofen (ADVIL,MOTRIN) 200 MG tablet Take 400 mg by mouth every 6 (six) hours as needed.    . isosorbide mononitrate (IMDUR) 30 MG 24 hr tablet Take 1 tablet (30 mg total) by mouth daily. 90 tablet 3  . labetalol (NORMODYNE) 300 MG tablet Take 1 tablet (300 mg total) by mouth 2 (two) times daily. (Patient taking differently: Take 300 mg by mouth daily. ) 60 tablet 11  . potassium chloride SA (K-DUR,KLOR-CON) 20 MEQ tablet Take 1 tablet (20 mEq total) by mouth daily. 30 tablet 6  . cloNIDine (CATAPRES) 0.1 MG tablet Take 1 tablet (0.1 mg total) by mouth 2 (two) times daily. 60 tablet 11   No current facility-administered medications for this visit.    Allergies:   Review of patient's allergies indicates no known allergies.    Social History:  The patient  reports that he has never smoked. He has never used smokeless tobacco. He reports that he drinks alcohol. He  reports that he does not use illicit drugs.   Family History:  The patient's family history includes Heart Problems in his brother, father, and mother; Heart attack in his brother and father; Heart failure in his mother; Hypertension in his brother, father, and mother; Stroke in his mother.    ROS:   Please see the history of present illness.   Review of Systems  HENT: Positive for headaches.   Eyes: Positive for visual disturbance.  Cardiovascular: Positive for chest pain, dyspnea on exertion, irregular heartbeat and paroxysmal nocturnal dyspnea.  Respiratory: Positive for cough and wheezing.   Musculoskeletal: Positive for back pain, joint pain and myalgias.  Neurological: Positive for dizziness.  All other systems reviewed and are negative.     PHYSICAL EXAM: VS:  BP 145/100 mmHg  Pulse 63  Ht 6\' 1"  (1.854 m)  Wt 240 lb (108.863 kg)  BMI 31.67 kg/m2    Wt Readings from Last 3 Encounters:  02/16/15 240 lb (108.863 kg)  01/26/15 243 lb 3.2 oz (110.315 kg)  01/19/15 238 lb (107.956 kg)     GEN: Well nourished, well developed, in no  acute distress HEENT: normal Neck: no JVD, no masses Cardiac:  Normal S1/S2, RRR; no murmur ,  no rubs or gallops, no edema   Respiratory:  clear to auscultation bilaterally, no wheezing, rhonchi or rales. GI: soft, nontender, nondistended, + BS MS: no deformity or atrophy Skin: warm and dry  Neuro:  CNs II-XII intact, Strength and sensation are intact Psych: Normal affect   EKG:  EKG is ordered today.  It demonstrates:   NSR, HR 63, LVH, TWI 3, aVF, V4-6, no change from prior tracing.    Recent Labs: 02/17/2014: TSH 1.445 01/19/2015: B Natriuretic Peptide 86.8; Hemoglobin 14.6; Platelets 214 02/16/2015: BUN 20; Creatinine 1.46; Potassium 4.3; Pro B Natriuretic peptide (BNP) 140.0*; Sodium 137    Lipid Panel    Component Value Date/Time   CHOL 139 01/20/2014 0048   TRIG 63 01/20/2014 0048   HDL 41 01/20/2014 0048   CHOLHDL 3.4 01/20/2014  0048   VLDL 13 01/20/2014 0048   LDLCALC 85 01/20/2014 0048      ASSESSMENT AND PLAN:  Hypertensive cardiomyopathy, with heart failure:  BP remains uncontrolled.  He is on max dose Norvasc, Benazepril, Hydralazine, Labetalol (HR 63).  I'm not sure increasing his Imdur will do much.  He has CHF and is NYHA 2b.  He gets his medications through the [facility].  I tried to call there to see if they could get him Delene Loll but could not get anyone to answer in the pharmacy.  I am not sure how much switching to [facility] would do for his BP.  Will try to keep checking with them and if they will cover, we can always try to switch him.    -  I will add Clonidine 0.1 mg Twice daily.    -  Check BMET, BNP today.    -  Obtain renal arterial dopplers to r/o RAS.    Chronic combined systolic and diastolic CHF (congestive heart failure):  Volume appears stable. He has chronic DOE.  Check BMET, BNP.   Cardiomyopathy, dilated, nonischemic:  Continue beta-blocker, ACE inhibitor, nitrates, Hydralazine.  Consider Spironolactone depending upon stability of renal function.    Nonsustained paroxysmal ventricular tachycardia:  He has seen EP.  Not a candidate for ICD.  Will need to monitor EF with periodic echo and refer back if EF < 35%.  Tricuspid valve mass:  Stable by last TEE.  Mitral valve regurgitation:  Mild to mod MR by last TEE. He will need repeat echo in the future.   CKD (chronic kidney disease), stage III - Plan: Basic Metabolic Panel (BMET)  Snoring - Plan: Split night study.  Treating OSA will help his BP.   Current medicines are reviewed at length with the patient today.  Concerns regarding medicines are as outlined above.  The following changes have been made:    Start Clonidine 0.1 mg Twice daily    Labs/ tests ordered today include:  Orders Placed This Encounter  Procedures  . Basic Metabolic Panel (BMET)  . B Nat Peptide  . EKG 12-Lead  . Split night study    Disposition:    FU with Dr. Fransico Him 1 month.    Signed, Versie Starks, MHS 02/16/2015 9:21 PM    [facility] [address], [place], [place]  [address] Phone: [phone]; Fax: [phone]

## 2015-02-17 NOTE — Telephone Encounter (Signed)
Follow Up       Pt returning Bethany's phone call.

## 2015-02-17 NOTE — Telephone Encounter (Signed)
Patient is aware that his sleep study is scheduled for 05/04/15 at [place]. He is also aware that there is a packet of information coming to him regarding his sleep study appointment.

## 2015-02-20 ENCOUNTER — Telehealth: Payer: Self-pay | Admitting: *Deleted

## 2015-02-20 DIAGNOSIS — N183 Chronic kidney disease, stage 3 unspecified: Secondary | ICD-10-CM

## 2015-02-20 DIAGNOSIS — I5042 Chronic combined systolic (congestive) and diastolic (congestive) heart failure: Secondary | ICD-10-CM

## 2015-02-20 MED ORDER — SPIRONOLACTONE 25 MG PO TABS: 12.5000 mg | ORAL_TABLET | Freq: Every day | ORAL | Status: DC

## 2015-02-20 NOTE — Telephone Encounter (Signed)
Pt notified of lab results and med changes, bmet 6/7, 6/14, 6/21 pt agreeable to plan of care. Stop K+, start spironolactone 12.5 mg daily, Rx sent in.

## 2015-02-21 ENCOUNTER — Other Ambulatory Visit (INDEPENDENT_AMBULATORY_CARE_PROVIDER_SITE_OTHER): Payer: No Typology Code available for payment source

## 2015-02-21 ENCOUNTER — Encounter: Payer: Self-pay | Admitting: Physician Assistant

## 2015-02-21 ENCOUNTER — Ambulatory Visit (HOSPITAL_COMMUNITY): Payer: No Typology Code available for payment source | Attending: Internal Medicine

## 2015-02-21 DIAGNOSIS — N183 Chronic kidney disease, stage 3 unspecified: Secondary | ICD-10-CM

## 2015-02-21 DIAGNOSIS — I5042 Chronic combined systolic (congestive) and diastolic (congestive) heart failure: Secondary | ICD-10-CM

## 2015-02-21 DIAGNOSIS — I10 Essential (primary) hypertension: Secondary | ICD-10-CM

## 2015-02-21 LAB — BASIC METABOLIC PANEL
BUN: 18 mg/dL (ref 6–23)
CALCIUM: 9.3 mg/dL (ref 8.4–10.5)
CO2: 25 mEq/L (ref 19–32)
Chloride: 106 mEq/L (ref 96–112)
Creatinine, Ser: 1.48 mg/dL (ref 0.40–1.50)
GFR: 64.32 mL/min (ref >60.00)
GLUCOSE: 90 mg/dL (ref 70–99)
Potassium: 4.4 mEq/L (ref 3.5–5.1)
SODIUM: 137 meq/L (ref 135–145)

## 2015-02-24 NOTE — Progress Notes (Signed)
[place] is not able to get [facility]. Richardson Dopp, PA-C   02/24/2015 2:22 PM

## 2015-02-28 ENCOUNTER — Other Ambulatory Visit (INDEPENDENT_AMBULATORY_CARE_PROVIDER_SITE_OTHER): Payer: Self-pay | Admitting: *Deleted

## 2015-02-28 ENCOUNTER — Telehealth: Payer: Self-pay | Admitting: *Deleted

## 2015-02-28 DIAGNOSIS — N183 Chronic kidney disease, stage 3 unspecified: Secondary | ICD-10-CM

## 2015-02-28 DIAGNOSIS — I5042 Chronic combined systolic (congestive) and diastolic (congestive) heart failure: Secondary | ICD-10-CM

## 2015-02-28 LAB — BASIC METABOLIC PANEL
BUN: 16 mg/dL (ref 6–23)
CALCIUM: 9.4 mg/dL (ref 8.4–10.5)
CO2: 28 meq/L (ref 19–32)
CREATININE: 1.69 mg/dL — AB (ref 0.40–1.50)
Chloride: 102 mEq/L (ref 96–112)
GFR: 55.19 mL/min — AB (ref >60.00)
Glucose, Bld: 81 mg/dL (ref 70–99)
Potassium: 3.8 mEq/L (ref 3.5–5.1)
Sodium: 137 mEq/L (ref 135–145)

## 2015-02-28 NOTE — Telephone Encounter (Signed)
Pt notified of lab results and is already scheduled for repeat bmet 6/21. pt verbalized understanding by phone.

## 2015-03-07 ENCOUNTER — Other Ambulatory Visit: Payer: Self-pay

## 2015-03-08 ENCOUNTER — Other Ambulatory Visit (INDEPENDENT_AMBULATORY_CARE_PROVIDER_SITE_OTHER): Payer: Self-pay | Admitting: *Deleted

## 2015-03-08 DIAGNOSIS — N183 Chronic kidney disease, stage 3 unspecified: Secondary | ICD-10-CM

## 2015-03-08 DIAGNOSIS — I5042 Chronic combined systolic (congestive) and diastolic (congestive) heart failure: Secondary | ICD-10-CM

## 2015-03-09 LAB — BASIC METABOLIC PANEL
BUN: 19 mg/dL (ref 6–23)
CALCIUM: 9.5 mg/dL (ref 8.4–10.5)
CO2: 22 mEq/L (ref 19–32)
CREATININE: 1.69 mg/dL — AB (ref 0.40–1.50)
Chloride: 108 mEq/L (ref 96–112)
GFR: 55.18 mL/min — AB (ref >60.00)
Glucose, Bld: 83 mg/dL (ref 70–99)
Potassium: 4.4 mEq/L (ref 3.5–5.1)
Sodium: 139 mEq/L (ref 135–145)

## 2015-03-13 ENCOUNTER — Telehealth: Payer: Self-pay | Admitting: Physician Assistant

## 2015-03-13 NOTE — Telephone Encounter (Signed)
Pt notified of lab results with verbal understanding by phone today.

## 2015-03-13 NOTE — Telephone Encounter (Signed)
New message       Pt returning Carol's call regarding test results.  Please advise

## 2015-03-27 ENCOUNTER — Ambulatory Visit (INDEPENDENT_AMBULATORY_CARE_PROVIDER_SITE_OTHER): Payer: Self-pay | Admitting: Cardiology

## 2015-03-27 ENCOUNTER — Encounter: Payer: Self-pay | Admitting: Cardiology

## 2015-03-27 VITALS — BP 142/94 | HR 86 | Ht 73.0 in | Wt 238.0 lb

## 2015-03-27 DIAGNOSIS — I5042 Chronic combined systolic (congestive) and diastolic (congestive) heart failure: Secondary | ICD-10-CM

## 2015-03-27 DIAGNOSIS — I429 Cardiomyopathy, unspecified: Secondary | ICD-10-CM

## 2015-03-27 DIAGNOSIS — I472 Ventricular tachycardia: Secondary | ICD-10-CM

## 2015-03-27 DIAGNOSIS — I420 Dilated cardiomyopathy: Secondary | ICD-10-CM

## 2015-03-27 DIAGNOSIS — I119 Hypertensive heart disease without heart failure: Secondary | ICD-10-CM

## 2015-03-27 DIAGNOSIS — I10 Essential (primary) hypertension: Secondary | ICD-10-CM

## 2015-03-27 DIAGNOSIS — I43 Cardiomyopathy in diseases classified elsewhere: Principal | ICD-10-CM

## 2015-03-27 DIAGNOSIS — I4729 Other ventricular tachycardia: Secondary | ICD-10-CM

## 2015-03-27 MED ORDER — CLONIDINE HCL 0.2 MG PO TABS: 0.2000 mg | ORAL_TABLET | Freq: Two times a day (BID) | ORAL | Status: DC

## 2015-03-27 NOTE — Progress Notes (Signed)
Cardiology Office Note   Date:  03/27/2015   ID:  Daniel Joseph, DOB 06/27/1961, MRN [id number]  PCP:  MATTHEWS,MICHELLE A., MD    Chief Complaint  Patient presents with  . Follow-up    chronic combine systolic and diastolic CHF, NYHA class 1      History of Present Illness: Daniel Joseph is a 54 y.o. 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 Initially seen in 01/2014. Echocardiogram demonstrated EF 35-40% and grade 2 diastolic dysfunction as well as an abnormality on the tricuspid valve concerning for mass or vegetation. He is on multiple antihypertensive medications. 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 Repeat TEE in 08/2014 demonstrated stability of the abnormality on the tricuspid valve without evidence of enlargement. Last seen by Dr. [name] in 10/2014. Exercise treadmill test in 3/16 demonstrated no ischemic changes and exaggerated blood pressure response. Blood pressure medications were adjusted. Event monitor in 3/16 demonstrated sinus rhythm, sinus tachycardia, PVCs and a run of wide-complex tachycardia. Holter monitor was arranged as well as Lexiscan Myoview. 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   Patient was evaluated by Dr. Lovena Le 01/26/15 for nonsustained ventricular tachycardia. ICD or EP study was not recommended. Follow-up echocardiograms are recommended with referral back to EP for ICD if EF dropped below 35%.  He returns for FU. He is overall doing ok. He has chronic DOE that is mainly worse when working out in the extreme heat. It is overall improved. He is NYHA 2b. He is trying to exercise more. He has chronic chest pain. He is able to walk around the track early in the morning for 2  laps. His chest discomfort is intermittent and atypical without change over years. He says that he actually feels better since he saw the PA last month.  He denies orthopnea, edema. He denies syncope but does get dizzy when outside too long in the heat.  Past Medical History  Diagnosis Date  . Hypertension     Renal Artery [place] 6/16:  No RAS, No AAA  . Mitral valve regurgitation 01/21/2014  . 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 Accelerated hypertension 01/19/2014  . Chronic combined systolic and diastolic CHF (congestive heart failure) 01/21/2014  . 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 Cardiomyopathy, dilated, nonischemic     Past Surgical History  Procedure Laterality Date  . Abdominal surgery  at birth  . Tee without cardioversion N/A 01/25/2014    Procedure: TRANSESOPHAGEAL ECHOCARDIOGRAM (TEE);  Surgeon: [name], MD;  Location: [place];  Service: Cardiovascular;  Laterality: N/A;  . Tee without cardioversion N/A 09/14/2014    Procedure: TRANSESOPHAGEAL ECHOCARDIOGRAM (TEE);  Surgeon: Sueanne Margarita, MD;  Location: [facility] ENDOSCOPY;  Service: Cardiovascular;  Laterality: N/A;     Current Outpatient Prescriptions  Medication Sig Dispense Refill  . amLODipine (NORVASC) 10 MG tablet TAKE 1 TABLET BY MOUTH DAILY 30 tablet 3  . aspirin EC 81 MG tablet Take 1 tablet (81 mg total) by mouth daily. 90 tablet 3  . benazepril (LOTENSIN) 40 MG tablet TAKE 1 TABLET BY MOUTH DAILY. 30 tablet 5  . cetirizine (ZYRTEC) 10 MG tablet Take 1 tablet (10 mg  total) by mouth daily. 30 tablet 3  . cloNIDine (CATAPRES) 0.1 MG tablet Take 1 tablet (0.1 mg total) by mouth 2 (two) times daily. 60 tablet 11  . doxazosin (CARDURA) 4 MG tablet Take 1.5 tablets (6 mg total) by mouth daily. 1 tablet at bedtime 45 tablet 3  . furosemide (LASIX) 40 MG tablet TAKE 1 TABLET BY MOUTH DAILY. 30 tablet 5  . hydrALAZINE (APRESOLINE) 50 MG tablet Take 2 tablets (100 mg total) by mouth 3 (three) times  daily. 540 tablet 3  . ibuprofen (ADVIL,MOTRIN) 200 MG tablet Take 400 mg by mouth every 6 (six) hours as needed.    . isosorbide mononitrate (IMDUR) 30 MG 24 hr tablet Take 1 tablet (30 mg total) by mouth daily. 90 tablet 3  . labetalol (NORMODYNE) 300 MG tablet Take 1 tablet (300 mg total) by mouth 2 (two) times daily. (Patient taking differently: Take 300 mg by mouth daily. ) 60 tablet 11  . spironolactone (ALDACTONE) 25 MG tablet Take 0.5 tablets (12.5 mg total) by mouth daily. 30 tablet 11   No current facility-administered medications for this visit.    Allergies:   Review of patient's allergies indicates no known allergies.    Social History:  The patient  reports that he has never smoked. He has never used smokeless tobacco. He reports that he drinks alcohol. He reports that he does not use illicit drugs.   Family History:  The patient's family history includes Heart Problems in his brother, father, and mother; Heart attack in his brother and father; Heart failure in his mother; Hypertension in his brother, father, and mother; Stroke in his mother.    ROS:  Please see the history of present illness.   Otherwise, review of systems are positive for none.   All other systems are reviewed and negative.    PHYSICAL EXAM: VS:  BP 142/94 mmHg  Pulse 86  Ht 6\' 1"  (1.854 m)  Wt 238 lb (107.956 kg)  BMI 31.41 kg/m2  SpO2 99% , BMI Body mass index is 31.41 kg/(m^2). GEN: Well nourished, well developed, in no acute distress HEENT: normal Neck: no JVD, carotid bruits, or masses Cardiac: RRR; no murmurs, rubs, or gallops,no edema  Respiratory:  clear to auscultation bilaterally, normal work of breathing GI: soft, nontender, nondistended, + BS MS: no deformity or atrophy Skin: warm and dry, no rash Neuro:  Strength and sensation are intact Psych: euthymic mood, full affect   EKG:  EKG is not ordered today.    Recent Labs: 01/19/2015: B Natriuretic Peptide 86.8; Hemoglobin 14.6;  Platelets 214 02/16/2015: Pro B Natriuretic peptide (BNP) 140.0* 03/08/2015: BUN 19; Creatinine, Ser 1.69*; Potassium 4.4; Sodium 139    Lipid Panel    Component Value Date/Time   CHOL 139 01/20/2014 0048   TRIG 63 01/20/2014 0048   HDL 41 01/20/2014 0048   CHOLHDL 3.4 01/20/2014 0048   VLDL 13 01/20/2014 0048   LDLCALC 85 01/20/2014 0048      Wt Readings from Last 3 Encounters:  03/27/15 238 lb (107.956 kg)  02/16/15 240 lb (108.863 kg)  01/26/15 243 lb 3.2 oz (110.315 kg)     ASSESSMENT AND PLAN:  Hypertensive cardiomyopathy, with heart failure:  He is on max dose Norvasc, Benazepril, Hydralazine, Labetalol (HR 63).  He has CHF and is NYHA 2b. He gets his medications through the [facility].              -  increase Clonidine 0.2  mg Twice daily.            -  followup in HTN clinic in 2 weeks - renal arterial dopplers showed no evidence of RAS.   Chronic combined systolic and diastolic CHF (congestive heart failure): Volume appears stable. He has chronic DOE only when working out in the heat.   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 Avoid spironolactone for now due to CKD.   Nonsustained paroxysmal ventricular tachycardia: He has seen EP. Not a candidate for ICD. Will need to monitor EF with periodic echo and refer back if EF < 35%.  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  Mitral valve regurgitation: Mild to mod MR by last TEE. He will need repeat echo in the future.   CKD (chronic kidney disease), stage III   Snoring - Plan: Split night study next month          Current medicines are reviewed at length with the patient today.  The patient does not have concerns regarding medicines.  The following changes have been made:  Increase clonidine to 0.2mg  BID  Labs/ tests ordered today: See above Assessment and Plan No orders of the defined types were placed  in this encounter.     Disposition:   FU with me in 6 months  Signed, Sueanne Margarita, MD  03/27/2015 11:42 AM    [facility] [address], [place], [place]  [address] Phone: [phone]; Fax: [phone]

## 2015-03-27 NOTE — Patient Instructions (Addendum)
Medication Instructions:  Increase clonidine to 0.2mg  two times a day. You can take 2 of your 0.1mg  tablets two times a day and use your current supply.   Labwork: None today  Testing/Procedures: None today  Follow-Up: You have been referred to Blood Pressure Clinic-schedule an appointment for 2 weeks.   Your physician wants you to follow-up in: 6 months with Dr [name]. (January  2017). You will receive a reminder letter in the mail two months in advance. If you don't receive a letter, please call our office to schedule the follow-up appointment.

## 2015-03-29 ENCOUNTER — Telehealth: Payer: Self-pay | Admitting: Cardiology

## 2015-03-29 NOTE — Telephone Encounter (Signed)
New message     Pt wants to know if it is okay to take alka seltzer for upset stomach given all the medications he is on at this time. Please call to discuss

## 2015-03-29 NOTE — Telephone Encounter (Signed)
Patient st his stomach was upset this morning after he eat. Informed patient that [place] has ASA, so he should not take that for upset stomach. Recommended patient try ginger ale or Mylanta instead. Patient agrees with treatment plan.

## 2015-04-11 ENCOUNTER — Ambulatory Visit: Payer: Self-pay | Admitting: Pharmacist

## 2015-04-13 ENCOUNTER — Ambulatory Visit: Payer: Self-pay | Admitting: Pharmacist

## 2015-04-25 NOTE — Telephone Encounter (Signed)
Chart opened in error

## 2015-05-02 ENCOUNTER — Other Ambulatory Visit: Payer: Self-pay | Admitting: Cardiology

## 2015-05-04 ENCOUNTER — Ambulatory Visit (HOSPITAL_BASED_OUTPATIENT_CLINIC_OR_DEPARTMENT_OTHER): Payer: Self-pay | Attending: Physician Assistant | Admitting: Radiology

## 2015-05-04 VITALS — Ht 73.0 in | Wt 230.0 lb

## 2015-05-04 DIAGNOSIS — I43 Cardiomyopathy in diseases classified elsewhere: Secondary | ICD-10-CM

## 2015-05-04 DIAGNOSIS — I429 Cardiomyopathy, unspecified: Secondary | ICD-10-CM

## 2015-05-04 DIAGNOSIS — I110 Hypertensive heart disease with heart failure: Secondary | ICD-10-CM

## 2015-05-04 DIAGNOSIS — I4729 Other ventricular tachycardia: Secondary | ICD-10-CM

## 2015-05-04 DIAGNOSIS — G4761 Periodic limb movement disorder: Secondary | ICD-10-CM | POA: Insufficient documentation

## 2015-05-04 DIAGNOSIS — I472 Ventricular tachycardia: Secondary | ICD-10-CM

## 2015-05-04 DIAGNOSIS — I10 Essential (primary) hypertension: Secondary | ICD-10-CM

## 2015-05-04 DIAGNOSIS — I493 Ventricular premature depolarization: Secondary | ICD-10-CM | POA: Insufficient documentation

## 2015-05-04 DIAGNOSIS — R0683 Snoring: Secondary | ICD-10-CM | POA: Insufficient documentation

## 2015-05-05 ENCOUNTER — Telehealth: Payer: Self-pay | Admitting: Cardiology

## 2015-05-05 NOTE — Telephone Encounter (Signed)
Please let patient know that sleep study showed no significant sleep apnea.    

## 2015-05-05 NOTE — Sleep Study (Signed)
SLEEP STUDY   Patient Name: Daniel, Joseph Date: 05/04/2015 MRN: [id number] Gender: Male D.O.B: 25-Feb-1961 Age (years): 71 Referring Provider: Richardson Dopp Interpreting Physician: Fransico Him MD, ABSM RPSGT: Laren Everts  Height (inches): 73 Weight (lbs): 230 BMI: 30 Neck Size: 18.00  CLINICAL INFORMATION  Sleep Study Type: NPSG  Indication for sleep study: Congestive Heart Failure, Fatigue, Hypertension, Obesity, OSA, Snoring, Witnessed Apneas  Epworth Sleepiness Score: 9  SLEEP STUDY TECHNIQUE As per the AASM Manual for the Scoring of Sleep and Associated Events v2.3 (April 2016) with a hypopnea requiring 4% desaturations.  The channels recorded and monitored were frontal, central and occipital EEG, electrooculogram (EOG), submentalis EMG (chin), nasal and oral airflow, thoracic and abdominal wall motion, anterior tibialis EMG, snore microphone, electrocardiogram, and pulse oximetry.  MEDICATIONS Patient's medications include: Amlodipine, ASA, Lotensin, Zyrtec, Clonidine, Cardura, Lasix, Hydralazine, Imdur, Labetolol, Spironolactone. Medications self-administered by patient during sleep study : No sleep medicine administered.  SLEEP ARCHITECTURE The study was initiated at 10:12:55 PM and ended at 4:43:32 AM.  Sleep onset time was 17.2 minutes and the sleep efficiency was reduced at 48.3%. The total sleep time was 188.5 minutes.  Stage REM latency was 89.5 minutes.  The patient spent 25.46% of the night in stage N1 sleep, 65.52% in stage N2 sleep, 0.00% in stage N3 and 9.02% in REM.  Alpha intrusion was absent.  Supine sleep was 69.76%.  RESPIRATORY PARAMETERS The overall apnea/hypopnea index (AHI) was 2.2 per hour. There were 3 total apneas, including 2 obstructive, 1 central and 0 mixed apneas. There were 4 hypopneas and 58 RERAs.  The AHI during Stage REM sleep was 17.6 per hour.  AHI while supine was 3.2 per hour.  The mean oxygen saturation  was 96.37%. The minimum SpO2 during sleep was 91.00%.  Moderate snoring was noted during this study.  CARDIAC DATA The 2 lead EKG demonstrated sinus rhythm with intermittent IVCD. The mean heart rate was 58.21 beats per minute. Other EKG findings include: PVCs.  LEG MOVEMENT DATA The total PLMS were 177 with a resulting PLMS index of 56.34. Associated arousal with leg movement index was 1.3 .  IMPRESSIONS No significant obstructive sleep apnea occurred during this study (AHI = 2.2/h). No significant central sleep apnea occurred during this study (CAI = 0.3/h). The patient had minimal or no oxygen desaturation during the study (Min O2 = 91.00%) The patient snored with Moderate snoring volume. EKG findings include PVCs and NSR with occasional IVCD.   Severe periodic limb movements of sleep occurred during the study. No significant associated arousals.  DIAGNOSIS Periodic Limb Movement Disorder  RECOMMENDATIONS Avoid alcohol, sedatives and other CNS depressants that may result in sleep apnea and disrupt normal sleep architecture. Sleep hygiene should be reviewed to assess factors that may improve sleep quality. Weight management and regular exercise should be initiated or continued if appropriate. The patient should be questioned on symptoms of restless legs.   Review of patient's medications shows patient is taking an antihistamine that is known to results in limb movements during sleep.    Signed: Fransico Him, MD ABSM Diplomate, American Board of Sleep Medicine 05/05/2015
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
## 2015-05-08 NOTE — Telephone Encounter (Signed)
Patient informed of results.  Stated verbal understanding 

## 2015-05-16 ENCOUNTER — Telehealth: Payer: Self-pay | Admitting: Physician Assistant

## 2015-05-16 NOTE — Telephone Encounter (Signed)
Please notify patient that his sleep test is neg for sleep apnea. He does have periodic limb movement  RECOMMENDATIONS Avoid alcohol, sedatives and other CNS depressants that may result in sleep apnea and disrupt normal sleep architecture. Sleep hygiene should be reviewed to assess factors that may improve sleep quality. >> FU with PCP to discuss Weight management and regular exercise should be initiated or continued  The patient should be questioned on symptoms of restless legs. >> he can FU with PCP to discuss.  Send copy of sleep study report to PCP.  Review of patient's medications shows patient is taking an antihistamine that is known to results in limb movements during sleep. >> avoid Zyrtec if possible (patient can review with PCP) Richardson Dopp, PA-C   05/16/2015 5:02 PM

## 2015-05-16 NOTE — Telephone Encounter (Signed)
lmptcb to go over sleep study results.

## 2015-05-17 ENCOUNTER — Telehealth: Payer: Self-pay | Admitting: Physician Assistant

## 2015-05-17 NOTE — Telephone Encounter (Signed)
Pt is aware of sleep study results and PA's recommendations. Pt verbalized understanding.

## 2015-05-17 NOTE — Telephone Encounter (Signed)
New message ° ° ° ° ° °Returning a nurses call to get test results °

## 2015-05-24 ENCOUNTER — Ambulatory Visit: Payer: Self-pay | Admitting: Family Medicine

## 2015-05-30 ENCOUNTER — Telehealth: Payer: Self-pay | Admitting: Cardiology

## 2015-05-30 NOTE — Telephone Encounter (Signed)
Instructed patient he can take OTC medications without "D."  Instructed him to speak to the pharmacist for recommendations of he has any trouble. Patient is grateful for call back.

## 2015-05-30 NOTE — Telephone Encounter (Signed)
NewMessage  Pt calling to speak w/ RN concerning some "sinus" trouble he is having and how to proceed. Please call back and discuss.

## 2015-06-01 ENCOUNTER — Telehealth: Payer: Self-pay | Admitting: Cardiology

## 2015-06-01 ENCOUNTER — Ambulatory Visit (INDEPENDENT_AMBULATORY_CARE_PROVIDER_SITE_OTHER): Payer: Self-pay | Admitting: Family Medicine

## 2015-06-01 VITALS — BP 148/89 | HR 95 | Temp 97.8°F | Resp 18 | Ht 73.0 in | Wt 237.0 lb

## 2015-06-01 DIAGNOSIS — J309 Allergic rhinitis, unspecified: Secondary | ICD-10-CM

## 2015-06-01 DIAGNOSIS — J3089 Other allergic rhinitis: Secondary | ICD-10-CM

## 2015-06-01 MED ORDER — CETIRIZINE HCL 10 MG PO TABS: 10.0000 mg | ORAL_TABLET | Freq: Every day | ORAL | Status: DC

## 2015-06-01 MED ORDER — FLUTICASONE PROPIONATE 50 MCG/ACT NA SUSP: 2.0000 | Freq: Every day | NASAL | Status: DC

## 2015-06-01 NOTE — Telephone Encounter (Signed)
New message ° ° ° ° °Returning Katy's call °

## 2015-06-01 NOTE — Telephone Encounter (Signed)
New message     Pt is wanting to know who Dr [name] would recommend him to see for a PCP Please call to discuss

## 2015-06-01 NOTE — Patient Instructions (Signed)
May use oxymetazoline nasal spray for 3-5 days for nasal congestion but no more than 5. Hopefully by then the [facility] will have kicked in Lots of liquids. We will call you about the sleep study results. Follow-up as needed.

## 2015-06-01 NOTE — Telephone Encounter (Signed)
Left message for patient to check with Carol Ada, MD and Harlan Stains, MD with [facility]. If they are out of network for him, use the [employer] on the website and [facility] has many great PCPs to choose from. Instructed patient to call back if he has further questions or concerns.

## 2015-06-01 NOTE — Telephone Encounter (Signed)
Left message for patient, again, instructing him to research [place] and [place]. If they are not covered, go to the [employer] and search for PCPs. Instructed patient to call back if he has further questions or concerns.

## 2015-06-01 NOTE — Progress Notes (Signed)
Patient ID: Daniel Joseph, male   DOB: 05-Dec-1960, 54 y.o.   MRN: [id number]   Daniel Joseph, is a 54 y.o. male  [id number]  [id number]  DOB - 08/14/1961  CC:  Chief Complaint  Patient presents with  . Follow-up    results from sleep study. sneezing/congestion/runny nose          HPI: Daniel Joseph is a 54 y.o. male here for  follow-up on sleep study. The studies are in the chart but I have not had a chance to review and have not received any recommendation. He also c/o of a two week history of nasal congestion. He has had a little intermittent sore throat. Denies fever, chills, cough or facial pain. He does have a history of seasonal allergies. Has been on Zyrtec but has not found it particularly helpful.   No Known Allergies Past Medical History  Diagnosis Date  . Hypertension     Renal Artery [place] 6/16:  No RAS, No AAA  . Mitral valve regurgitation 01/21/2014  . 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 Accelerated hypertension 01/19/2014  . Chronic combined systolic and diastolic CHF (congestive heart failure) 01/21/2014  . 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 Cardiomyopathy, dilated, nonischemic    Current Outpatient Prescriptions on File Prior to Visit  Medication Sig Dispense Refill  . amLODipine (NORVASC) 10 MG tablet TAKE 1 TABLET BY MOUTH DAILY 30 tablet 3  . aspirin EC 81 MG tablet Take 1 tablet (81 mg total) by mouth daily. 90 tablet 3  . benazepril (LOTENSIN) 40 MG tablet TAKE 1 TABLET BY MOUTH DAILY. 30 tablet 5  . cloNIDine (CATAPRES) 0.2 MG tablet Take 1 tablet (0.2 mg total) by mouth 2 (two) times daily. 180 tablet 3  . doxazosin (CARDURA) 4 MG tablet Take 1.5 tablets (6 mg total) by mouth daily. 1 tablet at bedtime 45 tablet 3  . doxazosin (CARDURA) 4 MG tablet TAKE 1 TABLET BY MOUTH AT BEDTIME 30 tablet 4  . furosemide (LASIX) 40 MG tablet TAKE 1 TABLET BY MOUTH DAILY. 30 tablet 5  . hydrALAZINE (APRESOLINE) 50 MG tablet Take 2 tablets (100 mg  total) by mouth 3 (three) times daily. 540 tablet 3  . ibuprofen (ADVIL,MOTRIN) 200 MG tablet Take 400 mg by mouth every 6 (six) hours as needed.    . isosorbide mononitrate (IMDUR) 30 MG 24 hr tablet Take 1 tablet (30 mg total) by mouth daily. 90 tablet 3  . spironolactone (ALDACTONE) 25 MG tablet Take 0.5 tablets (12.5 mg total) by mouth daily. 30 tablet 11  . labetalol (NORMODYNE) 300 MG tablet Take 1 tablet (300 mg total) by mouth 2 (two) times daily. (Patient not taking: Reported on 06/01/2015) 60 tablet 11   No current facility-administered medications on file prior to visit.   Family History  Problem Relation Age of Onset  . Heart Problems Mother   . Heart Problems Father   . Heart Problems Brother   . Stroke Mother   . Heart attack Father   . Heart attack Brother   . Heart failure Mother   . Hypertension Mother   . Hypertension Father   . Hypertension Brother    Social History   Social History  . Marital Status: Single    Spouse Name: N/A  . Number of Children: N/A  . Years of Education: N/A   Occupational History  . Not on file.   Social History Main Topics  . Smoking  status: Never Smoker   . Smokeless tobacco: Never Used  . Alcohol Use: Yes     Comment: social  . Drug Use: No  . Sexual Activity: Not on file   Other Topics Concern  . Not on file   Social History Narrative    Review of Systems: Constitutional: Negative for weight change, appetite change, fatigue Eyes: Negative pain, visual disturbance Nose: Positive for nasal congestion and difficulty breathing. Respiratory: Negative for cough, shortness of breath,   Cardiovascular: Negative for chest pain, palpitations, pedal edema  Abdominal:Negative for nausea, vomiting, diarhea,constipation Genitourinary: Negative for frequency, urgency.  Psychiatric/Behavioral: Negative for depression, anxiety, confusion   Objective:   Filed Vitals:   06/01/15 0830  BP: 148/89  Pulse: 95  Temp: 97.8 F (36.6  C)  Resp: 18    Physical Exam: Constitutional: Patient appears well-developed and well-nourished. No distress. HENT: Normocephalic, atraumatic. Oropharynx is clear and moist. Nasal passages are inflammed and are touching almost completely occluding the nostrils. Eyes: Conjunctivae and EOM are normal. PERRLA, no scleral icterus. Neck: Normal ROM. Neck supple. No lymphadenopathy, No thyromegaly. CVS: RRR, S1/S2 +, no murmurs, no gallops, no rubs Pulmonary: Effort and breath sounds normal, no stridor, rhonchi, wheezes, rales.  Abdominal: Soft. Normoactive BS,, no distension, tenderness, rebound or guarding.  Musculoskeletal: Normal range of motion. No edema and no tenderness.  Neuro: Alert.Normal muscle tone coordination. Non-focal Skin: Skin is warm and dry. No rash noted. Not diaphoretic. No erythema. No pallor. Psychiatric: Normal mood and affect. Behavior, judgment, thought content normal.  Lab Results  Component Value Date   WBC 4.1 01/19/2015   HGB 14.6 01/19/2015   HCT 42.1 01/19/2015   MCV 88.1 01/19/2015   PLT 214 01/19/2015   Lab Results  Component Value Date   CREATININE 1.69* 03/08/2015   BUN 19 03/08/2015   NA 139 03/08/2015   K 4.4 03/08/2015   CL 108 03/08/2015   CO2 22 03/08/2015    No results found for: HGBA1C Lipid Panel     Component Value Date/Time   CHOL 139 01/20/2014 0048   TRIG 63 01/20/2014 0048   HDL 41 01/20/2014 0048   CHOLHDL 3.4 01/20/2014 0048   VLDL 13 01/20/2014 0048   LDLCALC 85 01/20/2014 0048       Assessment and plan:   1. Allergic rhinitis, unspecified allergic rhinitis type  -Flonase nasal spray, #1 with 11 refills, 2 strays each nostril daiyl -Zyrtec #30, one po q day prn -Afrin nasal spray , one spray each nostril daily for 3-5 days.   2. Sleep study - Will investigate and let him know.  Return if symptoms worsen or fail to improve.  The patient was given clear instructions to go to ER or return to medical center if  symptoms don't improve, worsen or new problems develop. The patient verbalized understanding       06/01/2015, 11:21 AM

## 2015-06-02 ENCOUNTER — Encounter: Payer: Self-pay | Admitting: Family Medicine

## 2015-06-06 ENCOUNTER — Telehealth: Payer: Self-pay | Admitting: Cardiology

## 2015-06-06 NOTE — Telephone Encounter (Signed)
New message  Pt called cannot get his results from his sleep study. PCP doesn't know how to read the results. Request a call back to discuss.

## 2015-06-06 NOTE — Telephone Encounter (Signed)
Patient was informed on 8/19 of no sleep apnea, per note from Dr. [name].   I have left a message to see exactly what he is needing. If he is needing a copy of the study, informed him that he could call [phone] and speak with someone in Medical Records.

## 2015-07-17 ENCOUNTER — Telehealth: Payer: Self-pay | Admitting: Family Medicine

## 2015-07-17 NOTE — Telephone Encounter (Signed)
Patient calling regarding referal for Opthalmalogy, Podiatry  and for results from his sleep study.

## 2015-07-18 NOTE — Telephone Encounter (Signed)
Left a message advising patient to call and schedule an appointment to be evaluated for current problems and to see if he needs to be referred to specialists. Thanks!

## 2015-07-27 ENCOUNTER — Ambulatory Visit: Payer: Self-pay | Admitting: Family Medicine

## 2015-08-03 ENCOUNTER — Telehealth: Payer: Self-pay | Admitting: Family Medicine

## 2015-08-03 ENCOUNTER — Ambulatory Visit (INDEPENDENT_AMBULATORY_CARE_PROVIDER_SITE_OTHER): Payer: Self-pay | Admitting: Family Medicine

## 2015-08-03 ENCOUNTER — Encounter: Payer: Self-pay | Admitting: Family Medicine

## 2015-08-03 ENCOUNTER — Ambulatory Visit (HOSPITAL_COMMUNITY)
Admission: RE | Admit: 2015-08-03 | Discharge: 2015-08-03 | Disposition: A | Discharge status: Home / Self Care | Payer: No Typology Code available for payment source | Source: Ambulatory Visit | Attending: Family Medicine | Admitting: Family Medicine

## 2015-08-03 VITALS — BP 158/102 | HR 89 | Temp 97.8°F | Resp 16 | Ht 73.0 in | Wt 231.0 lb

## 2015-08-03 DIAGNOSIS — G8929 Other chronic pain: Secondary | ICD-10-CM | POA: Insufficient documentation

## 2015-08-03 DIAGNOSIS — I10 Essential (primary) hypertension: Secondary | ICD-10-CM

## 2015-08-03 DIAGNOSIS — M7732 Calcaneal spur, left foot: Secondary | ICD-10-CM | POA: Insufficient documentation

## 2015-08-03 DIAGNOSIS — M79672 Pain in left foot: Secondary | ICD-10-CM

## 2015-08-03 LAB — POCT URINALYSIS DIP (DEVICE)
Bilirubin Urine: NEGATIVE
GLUCOSE, UA: NEGATIVE mg/dL
Hgb urine dipstick: NEGATIVE
KETONES UR: NEGATIVE mg/dL
Leukocytes, UA: NEGATIVE
Nitrite: NEGATIVE
Protein, ur: NEGATIVE mg/dL
Specific Gravity, Urine: 1.01 (ref 1.005–1.030)
UROBILINOGEN UA: 0.2 mg/dL (ref 0.0–1.0)
pH: 5.5 (ref 5.0–8.0)

## 2015-08-03 MED ORDER — KETOROLAC TROMETHAMINE 30 MG/ML IJ SOLN
60.0000 mg | Freq: Once | INTRAMUSCULAR | Status: AC
  Administered 2015-08-03: 60 mg via INTRAMUSCULAR

## 2015-08-03 MED ORDER — TRAMADOL HCL 50 MG PO TABS: 50.0000 mg | ORAL_TABLET | Freq: Four times a day (QID) | ORAL | Status: DC | PRN

## 2015-08-03 NOTE — Progress Notes (Signed)
Subjective:    Patient ID: Daniel Joseph, male    DOB: 04-Jun-1961, 54 y.o.   MRN: [id number]  Foot Pain Pertinent negatives include no numbness or weakness.   Daniel Joseph, 54 year old with a history of accelerated hypertension  is here for evaluation of left foot pain. Patient maintains that foot pain has been present for 1 month. Current pain intensity is 7/10 described as constant and aching. Left foot pain is aggravated by bearing weight, increased activity, and standing for long periods of time.   Past Medical History  Diagnosis Date  . Hypertension     Renal Artery [place] 6/16:  No RAS, No AAA  . Mitral valve regurgitation 01/21/2014  . 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 Accelerated hypertension 01/19/2014  . Chronic combined systolic and diastolic CHF (congestive heart failure) 01/21/2014  . 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 Cardiomyopathy, dilated, nonischemic    Social History   Social History  . Marital Status: Single    Spouse Name: N/A  . Number of Children: N/A  . Years of Education: N/A   Occupational History  . Not on file.   Social History Main Topics  . Smoking status: Never Smoker   . Smokeless tobacco: Never Used  . Alcohol Use: Yes     Comment: social  . Drug Use: No  . Sexual Activity: Not on file   Other Topics Concern  . Not on file   Social History Narrative   Review of Systems  Constitutional: Negative.   HENT: Negative.   Eyes: Positive for redness.  Respiratory: Negative.   Cardiovascular: Negative.   Gastrointestinal: Negative.   Endocrine: Negative for polydipsia, polyphagia and polyuria.  Musculoskeletal: Negative.        Left foot pain  Skin: Negative.   Neurological: Negative for tremors, weakness and numbness.  Hematological: Negative.   Psychiatric/Behavioral: Negative.        Objective:   Physical Exam  Constitutional: He is oriented to person, place, and time. He appears well-developed and  well-nourished.  HENT:  Head: Normocephalic and atraumatic.  Right Ear: Hearing, external ear and ear canal normal.  Nose: Right sinus exhibits no maxillary sinus tenderness and no frontal sinus tenderness. Left sinus exhibits no maxillary sinus tenderness and no frontal sinus tenderness.  Mouth/Throat: Uvula is midline, oropharynx is clear and moist and mucous membranes are normal.  Eyes: Conjunctivae and EOM are normal. Pupils are equal, round, and reactive to light.  Neck: Normal range of motion. Neck supple.  Cardiovascular: Normal rate, regular rhythm, normal heart sounds and intact distal pulses.   Pulmonary/Chest: Effort normal and breath sounds normal.  Abdominal: Soft. Bowel sounds are normal.  Musculoskeletal:       Left ankle: He exhibits decreased range of motion. He exhibits no swelling and normal pulse. Tenderness. Achilles tendon normal.  Pain to plantar aspect of left heel. Tender to palpation.   Neurological: He is alert and oriented to person, place, and time. He has normal reflexes.  Skin: Skin is warm and dry.  Psychiatric: He has a normal mood and affect. His behavior is normal. Judgment and thought content normal.      BP 158/102 mmHg  Pulse 89  Temp(Src) 97.8 F (36.6 C) (Oral)  Resp 16  Ht 6\' 1"  (1.854 m)  Wt 231 lb (104.781 kg)  BMI 30.48 kg/m2 Assessment & Plan:  1. Left foot pain Patient has pain to plantar aspect  of left foot, primarily along heel. I suspect that patient has heel spurs. I recommend that patient invest in insoles for shoes, use ice therapy to left foot, and foot stretches. Will start Tramadol 50 mg every 6 hours as needed for moderate to severe pain. Patient to follow up in office as needed.  - ketorolac (TORADOL) 30 MG/ML injection 60 mg; Inject 2 mLs (60 mg total) into the muscle once. - traMADol (ULTRAM) 50 MG tablet; Take 1 tablet (50 mg total) by mouth every 6 (six) hours as needed.  Dispense: 30 tablet; Refill: 0 - DG Foot Complete  Left; Future  2. Essential hypertension Patient has a history of accelerated hypertension. He is taking all anti-hypertensive medications consistently. Patient has an appointment scheduled with Dr. Fransico Joseph, cardiologist. The patient is asked to make an attempt to improve diet and exercise patterns to aid in medical management of this problem. Will check urinalysis for proteinuria.  - POCT urinalysis dip (device)   The patient was given clear instructions to go to ER or return to medical center if symptoms do not improve, worsen or new problems develop. The patient verbalized understanding. Will notify patient with laboratory results. Dorena Dew, FNP

## 2015-08-03 NOTE — Patient Instructions (Addendum)
Given Toradol 60 mg IM  Tramadol 50 mg every 6 hours as needed for moderate to severe pain. Refrain from drinking alcohol, driving, or operating machinery while taking Tramadol. Heel Spur A heel spur is a bony growth that forms on the bottom of your heel bone (calcaneus). Heel spurs are common and do not always cause pain. However, heel spurs often cause inflammation in the strong band of tissue that runs underneath the bone of your foot (plantar fascia). When this happens, you may feel pain on the bottom of your foot, near your heel.  CAUSES  The cause of heel spurs is not completely understood. They may be caused by pressure on the heel. Or, they may stem from the muscle attachments (tendons) near the spur pulling on the heel.  RISK FACTORS You may be at risk for a heel spur if you:  Are older than 40.  Are overweight.  Have wear and tear arthritis (osteoarthritis).  Have plantar fascia inflammation. SIGNS AND SYMPTOMS  Some people have heel spurs but no symptoms. If you do have symptoms, they may include:   Pain in the bottom of your heel.  Pain that is worse when you first get out of bed.  Pain that gets worse after walking or standing. DIAGNOSIS  Your health care provider may diagnose a heel spur based on your symptoms and a physical exam. You may also have an X-ray of your foot to check for a bony growth coming from the calcaneus.  TREATMENT Treatment aims to relieve the pain from the heel spur. This may include:  Stretching exercises.  Losing weight.  Wearing specific shoes, inserts, or orthotics for comfort and support.  Wearing splints at night to properly position your feet.  Taking over-the-counter medicine to relieve pain.  Being treated with high-intensity sound waves to break up the heel spur (extracorporeal shock wave therapy).  Getting steroid injections in your heel to reduce swelling and ease pain.  Having surgery if your heel spur causes long-term  (chronic) pain. HOME CARE INSTRUCTIONS   Take medicines only as directed by your health care provider.  Ask your health care provider if you should use ice or cold packs on the painful areas of your heel or foot.  Avoid activities that cause you pain until you recover or as directed by your health care provider.  Stretch before exercising or being physically active.  Wear supportive shoes that fit well as directed by your health care provider. You might need to buy new shoes. Wearing old shoes or shoes that do not fit correctly may not provide the support that you need.  Lose weight if your health care provider thinks you should. This can relieve pressure on your foot that may be causing pain and discomfort. SEEK MEDICAL CARE IF:   Your pain continues or gets worse.   This information is not intended to replace advice given to you by your health care provider. Make sure you discuss any questions you have with your health care provider.   Document Released: 10/09/2005 Document Revised: 09/23/2014 Document Reviewed: 11/03/2013 Elsevier Interactive Patient Education [employer].

## 2015-08-03 NOTE — Telephone Encounter (Signed)
Notified patient. Discussed left foot xray at length. Given information on heel spurs. Patient to follow up in office as scheduled.   Dorena Dew, FNP

## 2015-08-17 ENCOUNTER — Other Ambulatory Visit: Payer: Self-pay | Admitting: Cardiology

## 2015-08-24 ENCOUNTER — Telehealth: Payer: Self-pay | Admitting: *Deleted

## 2015-08-24 NOTE — Telephone Encounter (Signed)
Patient called c/o drowsiness during the day for the last couple weeks. He blames his medications for making him feel this way - but does not know which one is the culprit.  Confirmed with patient he is taking Cardura at night. During the conversation, the patient stated many medication dose changes. Med list updated. He would like for Dr. [name] to review his medications and make recommendations. He also mentioned that last week, he experienced a sharp pain in his chest that lasted 5 minutes. He sat on the edge of a chair, rested, and it went away.  He is currently asymptomatic. Pt st he sporadically checks his BP. Last reading was 165/90.  To Dr. [name].

## 2015-08-24 NOTE — Telephone Encounter (Signed)
Please have patient come in to see extender since there are several things to address

## 2015-08-24 NOTE — Telephone Encounter (Signed)
Patient calling to speak with Valetta Fuller, Dr. Theodosia Blender nurse about side affects of medication. His call was sent to the refill voicemail and he would like a call back from [place] to speak with her specifically.  He says it makes him tired but other wise he feels fine just tired.  No other complaints. I let him know I will forward this message to [facility] and she will get back with him at her earliest convenience. He verbally agreed that was fine with him and was thankful for the call back.  Will route th is to [employer].

## 2015-08-25 NOTE — Telephone Encounter (Signed)
Scheduled patient 12/14 with Lyda Jester, PA.  Instructed patient to call if symptoms occur prior to OV. Patient grateful for call.

## 2015-08-29 ENCOUNTER — Telehealth: Payer: Self-pay | Admitting: Internal Medicine

## 2015-08-29 ENCOUNTER — Other Ambulatory Visit: Payer: Self-pay | Admitting: Family Medicine

## 2015-08-29 ENCOUNTER — Telehealth: Payer: Self-pay | Admitting: Family Medicine

## 2015-08-29 DIAGNOSIS — M7730 Calcaneal spur, unspecified foot: Secondary | ICD-10-CM

## 2015-08-29 NOTE — Telephone Encounter (Signed)
Patient is requesting a referral for podiatry. LOV  Was 08/03/2015. Is this possible? Please advise. Thanks!

## 2015-08-29 NOTE — Telephone Encounter (Signed)
Patient called for a referral to a podiatrist.

## 2015-08-30 ENCOUNTER — Encounter: Payer: Self-pay | Admitting: Cardiology

## 2015-08-30 ENCOUNTER — Ambulatory Visit (INDEPENDENT_AMBULATORY_CARE_PROVIDER_SITE_OTHER): Payer: No Typology Code available for payment source | Admitting: Cardiology

## 2015-08-30 VITALS — BP 130/90 | HR 74 | Ht 73.0 in | Wt 231.0 lb

## 2015-08-30 DIAGNOSIS — I428 Other cardiomyopathies: Secondary | ICD-10-CM

## 2015-08-30 DIAGNOSIS — R5383 Other fatigue: Secondary | ICD-10-CM

## 2015-08-30 DIAGNOSIS — I5042 Chronic combined systolic (congestive) and diastolic (congestive) heart failure: Secondary | ICD-10-CM

## 2015-08-30 DIAGNOSIS — I479 Paroxysmal tachycardia, unspecified: Secondary | ICD-10-CM

## 2015-08-30 LAB — CBC WITH DIFFERENTIAL/PLATELET
BASOS ABS: 0 10*3/uL (ref 0.0–0.1)
BASOS PCT: 0 % (ref 0–1)
EOS ABS: 0.1 10*3/uL (ref 0.0–0.7)
EOS PCT: 5 % (ref 0–5)
HCT: 42.4 % (ref 39.0–52.0)
Hemoglobin: 14.5 g/dL (ref 13.0–17.0)
Lymphocytes Relative: 42 % (ref 12–46)
Lymphs Abs: 1.2 10*3/uL (ref 0.7–4.0)
MCH: 30.1 pg (ref 26.0–34.0)
MCHC: 34.2 g/dL (ref 30.0–36.0)
MCV: 88 fL (ref 78.0–100.0)
MPV: 10.8 fL (ref 8.6–12.4)
Monocytes Absolute: 0.3 10*3/uL (ref 0.1–1.0)
Monocytes Relative: 12 % (ref 3–12)
Neutro Abs: 1.1 10*3/uL — ABNORMAL LOW (ref 1.7–7.7)
Neutrophils Relative %: 41 % — ABNORMAL LOW (ref 43–77)
PLATELETS: 187 10*3/uL (ref 150–400)
RBC: 4.82 MIL/uL (ref 4.22–5.81)
RDW: 14.2 % (ref 11.5–15.5)
WBC: 2.8 10*3/uL — AB (ref 4.0–10.5)

## 2015-08-30 LAB — COMPREHENSIVE METABOLIC PANEL
ALK PHOS: 64 U/L (ref 40–115)
ALT: 25 U/L (ref 9–46)
AST: 17 U/L (ref 10–35)
Albumin: 4.4 g/dL (ref 3.6–5.1)
BUN: 16 mg/dL (ref 7–25)
CO2: 26 mmol/L (ref 20–31)
Calcium: 9.6 mg/dL (ref 8.6–10.3)
Chloride: 101 mmol/L (ref 98–110)
Creat: 1.35 mg/dL — ABNORMAL HIGH (ref 0.70–1.33)
GLUCOSE: 91 mg/dL (ref 65–99)
POTASSIUM: 4.3 mmol/L (ref 3.5–5.3)
Sodium: 135 mmol/L (ref 135–146)
Total Bilirubin: 0.5 mg/dL (ref 0.2–1.2)
Total Protein: 7.9 g/dL (ref 6.1–8.1)

## 2015-08-30 LAB — TSH: TSH: 1.144 u[IU]/mL (ref 0.350–4.500)

## 2015-08-30 NOTE — Progress Notes (Signed)
08/30/2015 Daniel Joseph   August 14, 1961  [id number]  Primary Physician MATTHEWS,MICHELLE A., MD Primary Cardiologist: Dr. [name] Electrophysiologist: Dr. Lovena Le  Reason for Visit/CC: Fatigue  HPI:  54 y.o. 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 Initially seen in 01/2014. Echocardiogram demonstrated EF 35-40% and grade 2 diastolic dysfunction as well as an abnormality on the tricuspid valve concerning for mass or vegetation. He is on multiple antihypertensive medications. 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 Repeat TEE in 08/2014 demonstrated stability of the abnormality on the tricuspid valve without evidence of enlargement.  Exercise treadmill test in 3/16 demonstrated no ischemic changes and exaggerated blood pressure response. Blood pressure medications were adjusted. Event monitor in 3/16 demonstrated sinus rhythm, sinus tachycardia, PVCs and a run of wide-complex tachycardia. Holter monitor was arranged as well as Lexiscan Myoview. 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   Patient was evaluated by Dr. Lovena Le 01/26/15 for nonsustained ventricular tachycardia. ICD or EP study was not recommended. Follow-up echocardiograms are recommended with referral back to EP for ICD if EF dropped below 35%.  He was last seen by Dr. [name] 07/2015. At that time he endorsed chronic DOE, mainly worse with extreme heat, but was otherwise stable.   He presents to clinic today with a complaint of increased fatigue, which he seems to think may be related to polypharmacy. He is on the max dose of Norvasc, Benazepril, Hydralazine, Labetalol. He is also on clonidine.  He has also recently started taking PRN tramadol for heel spur pain and notes more  fatigue after taking this. He denies any additional medication changes. He keeps a regular check on his BP and HR at home and denies any h/o hypotension, bradycardia or tachycardia.    His last echo was in 01/2014. He was scheduled for a f/u echo 10/2014 but failed to get study done.  Per Dr. [name], plan is to monitor EF with periodic echo and refer back to EP if EF< 35%. Also, patient underwent a sleep study 05/2015. This was negative for sleep apnea.    Current Outpatient Prescriptions  Medication Sig Dispense Refill  . amLODipine (NORVASC) 10 MG tablet TAKE 1 TABLET BY MOUTH DAILY 30 tablet 6  . aspirin EC 81 MG tablet Take 1 tablet (81 mg total) by mouth daily. 90 tablet 3  . benazepril (LOTENSIN) 40 MG tablet TAKE 1 TABLET BY MOUTH DAILY. 30 tablet 5  . cetirizine (ZYRTEC) 10 MG tablet Take 1 tablet (10 mg total) by mouth daily. 30 tablet 11  . cloNIDine (CATAPRES) 0.2 MG tablet Take 0.2 mg by mouth daily.    Marland Kitchen doxazosin (CARDURA) 4 MG tablet Take 2 mg by mouth at bedtime.    . fluticasone (FLONASE) 50 MCG/ACT nasal spray Place 2 sprays into both nostrils daily. 16 g 6  . furosemide (LASIX) 40 MG tablet TAKE 1 TABLET BY MOUTH DAILY. 30 tablet 5  . hydrALAZINE (APRESOLINE) 50 MG tablet Take 2 tablets (100 mg total) by mouth 3 (three) times daily. 540 tablet 3  . ibuprofen (ADVIL,MOTRIN) 200 MG tablet Take 400 mg by mouth every 6 (six) hours as needed.    . isosorbide mononitrate (IMDUR) 30 MG 24 hr tablet Take 1 tablet (30 mg total) by mouth daily. 90 tablet 3  . labetalol (  NORMODYNE) 300 MG tablet Take 300 mg by mouth daily.    Marland Kitchen spironolactone (ALDACTONE) 25 MG tablet Take 0.5 tablets (12.5 mg total) by mouth daily. 30 tablet 11  . traMADol (ULTRAM) 50 MG tablet Take 1 tablet (50 mg total) by mouth every 6 (six) hours as needed. 30 tablet 0   No current facility-administered medications for this visit.    No Known Allergies  Social History   Social History  . Marital Status: Single      Spouse Name: N/A  . Number of Children: N/A  . Years of Education: N/A   Occupational History  . Not on file.   Social History Main Topics  . Smoking status: Never Smoker   . Smokeless tobacco: Never Used  . Alcohol Use: Yes     Comment: social  . Drug Use: No  . Sexual Activity: Not on file   Other Topics Concern  . Not on file   Social History Narrative     Review of Systems: General: negative for chills, fever, night sweats or weight changes.  Cardiovascular: negative for chest pain, dyspnea on exertion, edema, orthopnea, palpitations, paroxysmal nocturnal dyspnea or shortness of breath Dermatological: negative for rash Respiratory: negative for cough or wheezing Urologic: negative for hematuria Abdominal: negative for nausea, vomiting, diarrhea, bright red blood per rectum, melena, or hematemesis Neurologic: negative for visual changes, syncope, or dizziness All other systems reviewed and are otherwise negative except as noted above.    Blood pressure 130/90, pulse 74, height 6\' 1"  (1.854 m), weight 231 lb (104.781 kg).  General appearance: alert, cooperative and no distress Neck: no carotid bruit and no JVD Lungs: clear to auscultation bilaterally Heart: regular rate and rhythm, S1, S2 normal, no murmur, click, rub or gallop Extremities: no LEE Pulses: 2+ and symmetric Skin: Skin color, texture, turgor normal. No rashes or lesions Neurologic: Grossly normal  EKG NSR. No ischemia. 74 bpm   ASSESSMENT AND PLAN:   1. Fatigue: patient notes increased fatigue over the last week. Gets plenty of sleep at night, averaging 7 hrs. No significant dyspnea. He recently started taking tramadol, PRN, as prescribed by his PCP for heel spur pain. Somnolence is a potential side effect. He notes that he does seem more tired after taking it. He denies any other recent medication changes. He has been on his HF medications for a while now and his recent fatigue is new. VSS. No  hypotension, brady or tachycardia. EKG negative for arrhythmia. Recent sleep study and Myoview stress test were both normal. His recent fatigue may be due to use of tramadol. However will also check basic labs including CBC to r/o anemia, TSH to assess for hypothyroidism. Will also check a CMP. He is past due for his f/u echo. Will reases to r/o worsening HF and a potential etiology, also assess valve anatomy and wall motion.    2. Hypertensive cardiomyopathy, with heart failure: He is on max dose Norvasc, Benazepril, Hydralazine, Labetalol (HR 63).  He has CHF and is NYHA 2b. He gets his medications through the [facility].    3. Chronic combined systolic and diastolic CHF (congestive heart failure): Volume appears stable. He has chronic DOE only when working out in the heat.   4. 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 Avoid spironolactone for now due to CKD.   5. Nonsustained paroxysmal ventricular tachycardia: He has seen EP. Not a candidate for ICD. Will need to monitor EF with  periodic echo and refer back if EF < 35%. Last 2D echo was 01/2014. Will reassess with repeat echo given his increased fatigue.   7. 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 2D echo ordered.   8. Mitral valve regurgitation: Mild to mod MR by last TEE. Repeat echo ordered.   9. CKD (chronic kidney disease), stage III   10. Snoring -  Split night sleep study 05/2015 was negative of sleep apnea.   PLAN  Plan to keep f/u with Dr. [name] 10/2015. We will have him f/u sooner if any significant findings on echo or labs.   SIMMONS, BRITTAINY PA-C 08/30/2015 9:00 AM

## 2015-08-30 NOTE — Patient Instructions (Signed)
Medication Instructions:  Your physician recommends that you continue on your current medications as directed. Please refer to the Current Medication list given to you today.   Labwork: Cbc, Cmet, Tsh today  Testing/Procedures: Your physician has requested that you have an echocardiogram. Echocardiography is a painless test that uses sound waves to create images of your heart. It provides your doctor with information about the size and shape of your heart and how well your heart's chambers and valves are working. This procedure takes approximately one hour. There are no restrictions for this procedure. (Previously scheduled, Please move appt up to be done asap)  Follow-Up: Your physician recommends that you schedule a follow-up appointment in: Feb 2017 with Dr.Turner   Any Other Special Instructions Will Be Listed Below (If Applicable).     If you need a refill on your cardiac medications before your next appointment, please call your pharmacy.

## 2015-09-08 ENCOUNTER — Ambulatory Visit (HOSPITAL_COMMUNITY): Payer: No Typology Code available for payment source | Attending: Cardiology

## 2015-09-08 ENCOUNTER — Other Ambulatory Visit: Payer: Self-pay

## 2015-09-08 DIAGNOSIS — I5042 Chronic combined systolic (congestive) and diastolic (congestive) heart failure: Secondary | ICD-10-CM

## 2015-09-08 DIAGNOSIS — I5189 Other ill-defined heart diseases: Secondary | ICD-10-CM | POA: Insufficient documentation

## 2015-09-08 DIAGNOSIS — I340 Nonrheumatic mitral (valve) insufficiency: Secondary | ICD-10-CM | POA: Insufficient documentation

## 2015-09-08 DIAGNOSIS — I341 Nonrheumatic mitral (valve) prolapse: Secondary | ICD-10-CM | POA: Insufficient documentation

## 2015-09-08 DIAGNOSIS — I078 Other rheumatic tricuspid valve diseases: Secondary | ICD-10-CM

## 2015-09-08 DIAGNOSIS — I079 Rheumatic tricuspid valve disease, unspecified: Secondary | ICD-10-CM | POA: Insufficient documentation

## 2015-09-08 DIAGNOSIS — I420 Dilated cardiomyopathy: Secondary | ICD-10-CM

## 2015-09-08 DIAGNOSIS — I517 Cardiomegaly: Secondary | ICD-10-CM | POA: Insufficient documentation

## 2015-09-08 DIAGNOSIS — I429 Cardiomyopathy, unspecified: Secondary | ICD-10-CM

## 2015-10-18 MED FILL — ?AMLODIPINE BESYLATE 10 MG: 10 | 30 days supply | Qty: 30 | Fill #1

## 2015-10-18 MED FILL — ?SPIRONOLACTONE 25 MG TABLE: 25 | 30 days supply | Qty: 15 | Fill #4

## 2015-10-18 MED FILL — ?FUROSEMIDE 40 MG TABLET: 40 | 30 days supply | Qty: 30 | Fill #4

## 2015-10-18 MED FILL — BENAZEPRIL HCL 40 MG TABLET: 40 | 30 days supply | Qty: 30 | Fill #4

## 2015-10-18 MED FILL — ?CLONIDINE HCL 0.2 MG TABLE: 0.2 | 28 days supply | Qty: 56 | Fill #2

## 2015-10-18 MED FILL — ?DOXAZOSIN MESYLATE 4 MG TA: 4 MG | 30 days supply | Qty: 30 | Fill #3

## 2015-10-18 MED FILL — ?CETIRIZINE HCL 10 MG TABLE: 10 | 30 days supply | Qty: 30 | Fill #1

## 2015-10-18 MED FILL — hydrALAZINE HCL 50 MG TABS: 50 | 30 days supply | Qty: 180 | Fill #2

## 2015-10-18 MED FILL — FLUTICASONE PROP 50 MCG SPR: 50 | 30 days supply | Qty: 16 | Fill #2

## 2015-10-30 ENCOUNTER — Other Ambulatory Visit (HOSPITAL_COMMUNITY): Payer: Self-pay

## 2015-11-03 ENCOUNTER — Telehealth: Payer: Self-pay

## 2015-11-03 NOTE — Telephone Encounter (Signed)
Received disability paperwork from patient for Dr. [name] to sign. Per Dr. [name], patient has no disability from cardiac standpoint.  Instructed patient to have PCP complete paperwork. Placed paperwork at front desk for patient to pick up at his convenience. Patient grateful for call.

## 2015-11-06 ENCOUNTER — Ambulatory Visit: Payer: No Typology Code available for payment source | Admitting: Family Medicine

## 2015-11-09 ENCOUNTER — Ambulatory Visit (INDEPENDENT_AMBULATORY_CARE_PROVIDER_SITE_OTHER): Payer: Self-pay | Admitting: Family Medicine

## 2015-11-09 ENCOUNTER — Encounter: Payer: Self-pay | Admitting: Family Medicine

## 2015-11-09 VITALS — BP 162/108 | HR 85 | Temp 97.9°F | Ht 73.0 in | Wt 242.5 lb

## 2015-11-09 DIAGNOSIS — I10 Essential (primary) hypertension: Secondary | ICD-10-CM

## 2015-11-09 DIAGNOSIS — J019 Acute sinusitis, unspecified: Secondary | ICD-10-CM

## 2015-11-09 MED ORDER — CLONIDINE HCL 0.2 MG PO TABS: 0.2000 mg | ORAL_TABLET | Freq: Once | ORAL | Status: DC

## 2015-11-09 MED ORDER — CLONIDINE HCL 0.1 MG PO TABS
0.2000 mg | ORAL_TABLET | Freq: Once | ORAL | Status: AC
  Administered 2015-11-09: 0.2 mg via ORAL

## 2015-11-09 MED ORDER — AMOXICILLIN 500 MG PO CAPS: 500.0000 mg | ORAL_CAPSULE | Freq: Three times a day (TID) | ORAL | Status: DC

## 2015-11-09 NOTE — Progress Notes (Signed)
Patient ID: Daniel Joseph, male   DOB: 06/11/1961, 55 y.o.   MRN: [id number]   Daniel Joseph, is a 55 y.o. male  [id number]  [id number]  DOB - [birth year]  CC:  Chief Complaint  Patient presents with  . Nasal Congestion    x 2 weeks, no cough,   . Hypertension    follow up       HPI: Daniel Joseph is a 55 y.o. male here today complaining of head and nasal congestion and sinus pressure and pain. This has been going on for about 2 weeks. Patient has accelerated hypertension, mitral valve regurg,  Cardiomyopathy and CHF. These issues are managed by cardiology. He reports compliance with his medical regimine and that he has had his blood pressure medications today. He is on amlodipine 10, Lotensin 40, cardura 4, apresoline 50 tid, and Lasix. His BP today is 180/102. He reports feeling well except for the sinus congestion and pain. He was given clonidine 0.2 mg and his BP came down to 160/102.   Health Maintenance:  Not addressed today.  No Known Allergies Past Medical History  Diagnosis Date  . Hypertension     Renal Artery [place] 6/16:  No RAS, No AAA  . Mitral valve regurgitation 01/21/2014  . 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 Accelerated hypertension 01/19/2014  . Chronic combined systolic and diastolic CHF (congestive heart failure) ([place]) 01/21/2014  . 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 Cardiomyopathy, dilated, nonischemic [facility])    Current Outpatient Prescriptions on File Prior to Visit  Medication Sig Dispense Refill  . amLODipine (NORVASC) 10 MG tablet TAKE 1 TABLET BY MOUTH DAILY 30 tablet 6  . aspirin EC 81 MG tablet Take 1 tablet (81 mg total) by mouth daily. 90 tablet 3  . benazepril (LOTENSIN) 40 MG tablet TAKE 1 TABLET BY MOUTH DAILY. 30 tablet 5  . cetirizine (ZYRTEC) 10 MG tablet Take 1 tablet (10 mg total) by mouth daily. 30 tablet 11  . doxazosin (CARDURA) 4 MG tablet Take 2 mg by mouth at bedtime.    . fluticasone (FLONASE) 50 MCG/ACT nasal  spray Place 2 sprays into both nostrils daily. 16 g 6  . furosemide (LASIX) 40 MG tablet TAKE 1 TABLET BY MOUTH DAILY. 30 tablet 5  . hydrALAZINE (APRESOLINE) 50 MG tablet Take 2 tablets (100 mg total) by mouth 3 (three) times daily. 540 tablet 3  . ibuprofen (ADVIL,MOTRIN) 200 MG tablet Take 400 mg by mouth every 6 (six) hours as needed.    . isosorbide mononitrate (IMDUR) 30 MG 24 hr tablet Take 1 tablet (30 mg total) by mouth daily. 90 tablet 3  . labetalol (NORMODYNE) 300 MG tablet Take 300 mg by mouth daily.    Marland Kitchen spironolactone (ALDACTONE) 25 MG tablet Take 0.5 tablets (12.5 mg total) by mouth daily. 30 tablet 11  . traMADol (ULTRAM) 50 MG tablet Take 1 tablet (50 mg total) by mouth every 6 (six) hours as needed. 30 tablet 0   No current facility-administered medications on file prior to visit.   Family History  Problem Relation Age of Onset  . Heart Problems Mother   . Heart Problems Father   . Heart Problems Brother   . Stroke Mother   . Heart attack Father   . Heart attack Brother   . Heart failure Mother   . Hypertension Mother   . Hypertension Father   . Hypertension Brother    Social History  Social History  . Marital Status: Single    Spouse Name: N/A  . Number of Children: N/A  . Years of Education: N/A   Occupational History  . Not on file.   Social History Main Topics  . Smoking status: Never Smoker   . Smokeless tobacco: Never Used  . Alcohol Use: Yes     Comment: social  . Drug Use: No  . Sexual Activity: Not on file   Other Topics Concern  . Not on file   Social History Narrative    Review of Systems: Constitutional: Negative for fever, chills, appetite change, weight loss,  Fatigue. Skin: Negative for rashes or lesions of concern. HENT: Negative for ear pain, ear discharge.nose bleeds Eyes: Negative for pain, discharge, redness, itching and visual disturbance. Neck: Negative for pain, stiffness Respiratory: Negative for cough, shortness of  breath,   Cardiovascular: Negative for chest pain, palpitations and leg swelling. Gastrointestinal: Negative for abdominal pain, nausea, vomiting, diarrhea, constipations Genitourinary: Negative for dysuria, urgency, frequency, hematuria,  Musculoskeletal: Negative for back pain, joint pain, joint  swelling, and gait problem.Negative for weakness. Neurological: Negative for dizziness, tremors, seizures, syncope,   light-headedness, numbness and headaches.  Hematological: Negative for easy bruising or bleeding Psychiatric/Behavioral: Negative for depression, anxiety, decreased concentration, confusion   Objective:   Filed Vitals:   11/09/15 1101 11/09/15 1205  BP: 182/114 162/108  Pulse:    Temp:      Physical Exam: Constitutional: Patient appears well-developed and well-nourished. No distress. HENT: Normocephalic, atraumatic, External right and left ear normal. Oropharynx is clear and moist.  Eyes: Conjunctivae and EOM are normal. PERRLA, no scleral icterus. Neck: Normal ROM. Neck supple. No lymphadenopathy, No thyromegaly. CVS: RRR, S1/S2 +, no murmurs, no gallops, no rubs Pulmonary: Effort and breath sounds normal, no stridor, rhonchi, wheezes, rales.  Abdominal: Soft. Normoactive BS,, no distension, tenderness, rebound or guarding.  Musculoskeletal: Normal range of motion. No edema and no tenderness.  Neuro: Alert.Normal muscle tone coordination. Non-focal Skin: Skin is warm and dry. No rash noted. Not diaphoretic. No erythema. No pallor. Psychiatric: Normal mood and affect. Behavior, judgment, thought content normal.  Lab Results  Component Value Date   WBC 2.8* 08/30/2015   HGB 14.5 08/30/2015   HCT 42.4 08/30/2015   MCV 88.0 08/30/2015   PLT 187 08/30/2015   Lab Results  Component Value Date   CREATININE 1.35* 08/30/2015   BUN 16 08/30/2015   NA 135 08/30/2015   K 4.3 08/30/2015   CL 101 08/30/2015   CO2 26 08/30/2015    No results found for: HGBA1C Lipid  Panel     Component Value Date/Time   CHOL 139 01/20/2014 0048   TRIG 63 01/20/2014 0048   HDL 41 01/20/2014 0048   CHOLHDL 3.4 01/20/2014 0048   VLDL 13 01/20/2014 0048   LDLCALC 85 01/20/2014 0048       Assessment and plan:   1. Accelerated hypertension  - cloNIDine (CATAPRES) tablet 0.2 mg; Take 2 tablets (0.2 mg total) by mouth once. -Patient instructed to call his cardiologist today and make an appointment to follow-up ASAP  2. Acute sinusitis, recurrence not specified, unspecified location  - amoxicillin (AMOXIL) 500 MG capsule; Take 1 capsule (500 mg total) by mouth 3 (three) times daily.  Dispense: 30 capsule; Refill: 0   No Follow-up on file.  The patient was given clear instructions to go to ER or return to medical center if symptoms don't improve, worsen or new problems develop. The  patient verbalized understanding.    Micheline Chapman FNP  11/09/2015, 2:56 PM

## 2015-11-10 ENCOUNTER — Telehealth: Payer: Self-pay | Admitting: *Deleted

## 2015-11-14 NOTE — Progress Notes (Signed)
Cardiology Office Note   Date:  11/15/2015   ID:  Daniel Joseph, DOB December 29, 1960, MRN [id number]  PCP:  Sharon Seller, NP    Chief Complaint  Patient presents with  . Congestive Heart Failure  . Hypertension      History of Present Illness: Daniel Joseph is a 55 y.o. 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  EF 35-40% and grade 2 diastolic dysfunction on echo.  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    Event monitor in 3/16 demonstrated sinus rhythm, sinus tachycardia, PVCs and a run of wide-complex tachycardia. Holter monitor was arranged as well as Lexiscan Myoview. 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   Patient was evaluated by Dr. Lovena Le 01/26/15 for nonsustained ventricular tachycardia. ICD or EP study was not recommended.  Repeat echo 08/2015 with EF 45-50%.  He returns for FU. He is overall doing ok.He has had some problems with allergies and nasal congestion. He was placed on Amoxicillin for a sinus infection.  He says that prior to the sinus problems 2 weeks ago his SOB had resolved even with exertion .He is NYHA class I. He is trying to exercise more. He denies any chest pain. He is able to walk around the track early in the morning for 1 mile without any problems.   He denies orthopnea, PND, LE edema. He denies syncope or dizziness.   Past Medical History  Diagnosis Date  . Hypertension     Renal Artery [place] 6/16:  No RAS, No AAA  . Mitral valve regurgitation 01/21/2014  . 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 Accelerated hypertension 01/19/2014  . Chronic combined systolic and diastolic CHF (congestive heart failure) ([place]) 01/21/2014  . 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  Cardiomyopathy, dilated, nonischemic [facility])     Past Surgical History  Procedure Laterality Date  . Abdominal surgery  at birth  . Tee without cardioversion N/A 01/25/2014    Procedure: TRANSESOPHAGEAL ECHOCARDIOGRAM (TEE);  Surgeon: [name], MD;  Location: [place];  Service: Cardiovascular;  Laterality: N/A;  . Tee without cardioversion N/A 09/14/2014    Procedure: TRANSESOPHAGEAL ECHOCARDIOGRAM (TEE);  Surgeon: Sueanne Margarita, MD;  Location[facility] ENDOSCOPY;  Service: Cardiovascular;  Laterality: N/A;     Current Outpatient Prescriptions  Medication Sig Dispense Refill  . amLODipine (NORVASC) 10 MG tablet TAKE 1 TABLET BY MOUTH DAILY 30 tablet 6  . amoxicillin (AMOXIL) 500 MG capsule Take 1 capsule (500 mg total) by mouth 3 (three) times daily. 30 capsule 0  . aspirin EC 81 MG tablet Take 1 tablet (81 mg total) by mouth daily. 90 tablet 3  . benazepril (LOTENSIN) 40 MG tablet TAKE 1 TABLET BY MOUTH DAILY. 30 tablet 5  . cetirizine (ZYRTEC) 10 MG tablet Take 1 tablet (10 mg total) by mouth daily. 30 tablet 11  . cloNIDine (CATAPRES) 0.2 MG tablet Take 0.2 mg by mouth daily.   3  . doxazosin (CARDURA) 4 MG tablet Take 2 mg by mouth at bedtime.    . fluticasone (FLONASE) 50 MCG/ACT nasal spray Place 2 sprays into both nostrils daily. 16 g 6  . furosemide (LASIX)  40 MG tablet Take 40 mg by mouth daily.    . hydrALAZINE (APRESOLINE) 50 MG tablet Take 2 tablets (100 mg total) by mouth 3 (three) times daily. 540 tablet 3  . ibuprofen (ADVIL,MOTRIN) 200 MG tablet Take 400 mg by mouth every 6 (six) hours as needed.    . isosorbide mononitrate (IMDUR) 30 MG 24 hr tablet Take 1 tablet (30 mg total) by mouth daily. 90 tablet 3  . labetalol (NORMODYNE) 300 MG tablet Take 300 mg by mouth daily.    Marland Kitchen spironolactone (ALDACTONE) 25 MG tablet Take 0.5 tablets (12.5 mg total) by mouth daily. 30 tablet 11  . traMADol (ULTRAM) 50 MG tablet Take 1 tablet (50 mg total) by mouth every 6 (six) hours as  needed. 30 tablet 0   No current facility-administered medications for this visit.    Allergies:   Review of patient's allergies indicates no known allergies.    Social History:  The patient  reports that he has never smoked. He has never used smokeless tobacco. He reports that he drinks alcohol. He reports that he does not use illicit drugs.   Family History:  The patient's family history includes Heart Problems in his brother, father, and mother; Heart attack in his brother and father; Heart failure in his mother; Hypertension in his brother, father, and mother; Stroke in his mother.    ROS:  Please see the history of present illness.   Otherwise, review of systems are positive for none.   All other systems are reviewed and negative.    PHYSICAL EXAM: VS:  BP 142/94 mmHg  Pulse 88  Ht 6\' 1"  (1.854 m)  Wt 245 lb (111.131 kg)  BMI 32.33 kg/m2 , BMI Body mass index is 32.33 kg/(m^2). GEN: Well nourished, well developed, in no acute distress HEENT: normal Neck: no JVD, carotid bruits, or masses Cardiac: 2RRR; no murmurs, rubs, or gallops,no edema  Respiratory:  clear to auscultation bilaterally, normal work of breathing GI: soft, nontender, nondistended, + BS MS: no deformity or atrophy Skin: warm and dry, no rash Neuro:  Strength and sensation are intact Psych: euthymic mood, full affect   EKG:  EKG is not ordered today.    Recent Labs: 01/19/2015: B Natriuretic Peptide 86.8 02/16/2015: Pro B Natriuretic peptide (BNP) 140.0* 08/30/2015: ALT 25; BUN 16; Creat 1.35*; Hemoglobin 14.5; Platelets 187; Potassium 4.3; Sodium 135; TSH 1.144    Lipid Panel    Component Value Date/Time   CHOL 139 01/20/2014 0048   TRIG 63 01/20/2014 0048   HDL 41 01/20/2014 0048   CHOLHDL 3.4 01/20/2014 0048   VLDL 13 01/20/2014 0048   LDLCALC 85 01/20/2014 0048      Wt Readings from Last 3 Encounters:  11/15/15 245 lb (111.131 kg)  11/09/15 242 lb 8 oz (109.997 kg)  08/30/15 231 lb  (104.781 kg)     ASSESSMENT AND PLAN:    1. Hypertensive cardiomyopathy, with heart failure: He is on max dose Norvasc, Benazepril, Hydralazine, Labetalol (HR 63).  He has CHF and is NYHA I currently. He gets his medications [facility] the [facility]. His BP  At home runs 160/164mmHg at home this am.  Continue amlodipine/Lotensin/cardura/Hydralazine/Labetolol.  Increase Cardura to 6mg  qhs for better BP control. I have asked him to check his BP daily for a week and call with results.    2. Chronic combined systolic and diastolic CHF (congestive heart failure): Volume appears stable. He has no SOB or DOE.  3. Cardiomyopathy, dilated, nonischemic (EF 45-50% by echo 08/2015): Continue beta-blocker, ACE inhibitor, nitrates, Hydralazine, aldactone.   4. Nonsustained paroxysmal ventricular tachycardia: He has seen EP. Not a candidate for ICD. Will need to monitor EF with periodic echo and refer back if EF < 35%. Last 2D echo was 08/2015 with EF 45-50%.   5. 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 2D echo in 1 year to recheck  6. Mitral valve regurgitation: Mild to mod MR by last TEE.   7. CKD (chronic kidney disease), stage III   8. Snoring - Split night sleep study 05/2015 was negative of sleep apnea.    Current medicines are reviewed at length with the patient today.  The patient does not have concerns regarding medicines.  The following changes have been made:  no change  Labs/ tests ordered today: See above Assessment and Plan No orders of the defined types were placed in this encounter.     Disposition:   FU with me in 6 months  Signed, Sueanne Margarita, MD  11/15/2015 10:13 AM    [facility] [address], [place], [place]  [address] Phone: [phone]; Fax: [phone]

## 2015-11-15 ENCOUNTER — Ambulatory Visit (INDEPENDENT_AMBULATORY_CARE_PROVIDER_SITE_OTHER): Payer: Self-pay | Admitting: Cardiology

## 2015-11-15 ENCOUNTER — Encounter: Payer: Self-pay | Admitting: Cardiology

## 2015-11-15 VITALS — BP 142/94 | HR 88 | Ht 73.0 in | Wt 245.0 lb

## 2015-11-15 DIAGNOSIS — I119 Hypertensive heart disease without heart failure: Secondary | ICD-10-CM

## 2015-11-15 DIAGNOSIS — I429 Cardiomyopathy, unspecified: Secondary | ICD-10-CM

## 2015-11-15 DIAGNOSIS — I10 Essential (primary) hypertension: Secondary | ICD-10-CM

## 2015-11-15 DIAGNOSIS — I078 Other rheumatic tricuspid valve diseases: Secondary | ICD-10-CM

## 2015-11-15 DIAGNOSIS — I472 Ventricular tachycardia: Secondary | ICD-10-CM

## 2015-11-15 DIAGNOSIS — I079 Rheumatic tricuspid valve disease, unspecified: Secondary | ICD-10-CM

## 2015-11-15 DIAGNOSIS — I4729 Other ventricular tachycardia: Secondary | ICD-10-CM

## 2015-11-15 DIAGNOSIS — I43 Cardiomyopathy in diseases classified elsewhere: Principal | ICD-10-CM

## 2015-11-15 DIAGNOSIS — I420 Dilated cardiomyopathy: Secondary | ICD-10-CM

## 2015-11-15 DIAGNOSIS — I5042 Chronic combined systolic (congestive) and diastolic (congestive) heart failure: Secondary | ICD-10-CM

## 2015-11-15 LAB — BASIC METABOLIC PANEL
BUN: 16 mg/dL (ref 7–25)
CHLORIDE: 102 mmol/L (ref 98–110)
CO2: 25 mmol/L (ref 20–31)
Calcium: 9.1 mg/dL (ref 8.6–10.3)
Creat: 1.38 mg/dL — ABNORMAL HIGH (ref 0.70–1.33)
Glucose, Bld: 97 mg/dL (ref 65–99)
POTASSIUM: 4.2 mmol/L (ref 3.5–5.3)
SODIUM: 137 mmol/L (ref 135–146)

## 2015-11-15 MED ORDER — DOXAZOSIN MESYLATE 4 MG PO TABS: 6.0000 mg | ORAL_TABLET | Freq: Every day | ORAL | Status: DC

## 2015-11-15 NOTE — Patient Instructions (Addendum)
Medication Instructions:  Your physician has recommended you make the following change in your medication:  1) INCREASE CARDURA to 6 mg at night  Labwork: TODAY: BMET  Testing/Procedures: Your physician has requested that you have an echocardiogram in one year. Echocardiography is a painless test that uses sound waves to create images of your heart. It provides your doctor with information about the size and shape of your heart and how well your heart's chambers and valves are working. This procedure takes approximately one hour. There are no restrictions for this procedure.  Follow-Up: Your physician wants you to follow-up in: 6 MONTHS with Dr. [name]. You will receive a reminder letter in the mail two months in advance. If you don't receive a letter, please call our office to schedule the follow-up appointment.   Any Other Special Instructions Will Be Listed Below (If Applicable). Please check your BLOOD PRESSURE daily for a week and call with results.    If you need a refill on your cardiac medications before your next appointment, please call your pharmacy.

## 2015-11-15 NOTE — Addendum Note (Signed)
Addended by: Fransico Him R on: 11/15/2015 10:20 AM   Modules accepted: SmartSet

## 2015-11-27 ENCOUNTER — Other Ambulatory Visit: Payer: Self-pay | Admitting: *Deleted

## 2015-11-27 NOTE — Telephone Encounter (Signed)
Pt is requesting a refill on his prescription for tramadol. Please advise provider

## 2015-11-27 NOTE — Telephone Encounter (Signed)
Refill request for Tramadol. LOV 11/09/2015. Please advise. Thanks!

## 2015-11-28 NOTE — Telephone Encounter (Signed)
Patient verified DOB Patient informed of Tramadol refill being requested by provider. Patient will receive a follow-up phone call informing him of the approval or denial. Patient expressed his understanding and had no further questions at this time.

## 2015-11-30 ENCOUNTER — Other Ambulatory Visit: Payer: Self-pay | Admitting: Family Medicine

## 2015-11-30 DIAGNOSIS — M79672 Pain in left foot: Secondary | ICD-10-CM

## 2015-11-30 MED ORDER — TRAMADOL HCL 50 MG PO TABS: 50.0000 mg | ORAL_TABLET | Freq: Four times a day (QID) | ORAL | Status: DC | PRN

## 2015-12-07 ENCOUNTER — Ambulatory Visit: Payer: No Typology Code available for payment source | Admitting: Family Medicine

## 2015-12-14 ENCOUNTER — Telehealth: Payer: Self-pay

## 2015-12-23 ENCOUNTER — Other Ambulatory Visit: Payer: Self-pay | Admitting: Family Medicine

## 2015-12-25 MED FILL — ?FUROSEMIDE 40 MG TABLET: 40 | 30 days supply | Qty: 30 | Fill #5

## 2015-12-25 MED FILL — hydrALAZINE HCL 50 MG TABS: 50 | 30 days supply | Qty: 180 | Fill #3

## 2015-12-25 MED FILL — BENAZEPRIL HCL 40 MG TABLET: 40 | 30 days supply | Qty: 30 | Fill #5

## 2015-12-25 MED FILL — AMLODIPINE BESYLATE 10 MG T: 10 | 30 days supply | Qty: 30 | Fill #2

## 2015-12-25 MED FILL — ?SPIRONOLACTONE 25 MG TABLE: 25 | 30 days supply | Qty: 15 | Fill #5

## 2015-12-25 MED FILL — ?CETIRIZINE HCL 10 MG TABLE: 10 | 30 days supply | Qty: 30 | Fill #2

## 2015-12-25 MED FILL — ?CLONIDINE HCL 0.2 MG TABLE: 0.2 | 28 days supply | Qty: 56 | Fill #3

## 2015-12-26 MED FILL — FLUTICASONE PROP 50 MCG SPR: 50 | 30 days supply | Qty: 16 | Fill #3

## 2016-01-04 ENCOUNTER — Telehealth: Payer: Self-pay

## 2016-02-07 ENCOUNTER — Ambulatory Visit (INDEPENDENT_AMBULATORY_CARE_PROVIDER_SITE_OTHER): Payer: No Typology Code available for payment source | Admitting: Family Medicine

## 2016-02-07 ENCOUNTER — Encounter: Payer: Self-pay | Admitting: Family Medicine

## 2016-02-07 VITALS — BP 142/96 | HR 79 | Temp 98.1°F | Resp 16 | Ht 73.0 in | Wt 237.0 lb

## 2016-02-07 DIAGNOSIS — I10 Essential (primary) hypertension: Secondary | ICD-10-CM

## 2016-02-07 DIAGNOSIS — K0889 Other specified disorders of teeth and supporting structures: Secondary | ICD-10-CM

## 2016-02-07 DIAGNOSIS — K0510 Chronic gingivitis, plaque induced: Secondary | ICD-10-CM

## 2016-02-07 DIAGNOSIS — N183 Chronic kidney disease, stage 3 unspecified: Secondary | ICD-10-CM

## 2016-02-07 LAB — POCT URINALYSIS DIP (DEVICE)
BILIRUBIN URINE: NEGATIVE
Glucose, UA: NEGATIVE mg/dL
HGB URINE DIPSTICK: NEGATIVE
KETONES UR: NEGATIVE mg/dL
LEUKOCYTES UA: NEGATIVE
Nitrite: NEGATIVE
Protein, ur: NEGATIVE mg/dL
Urobilinogen, UA: 0.2 mg/dL (ref 0.0–1.0)
pH: 5 (ref 5.0–8.0)

## 2016-02-07 LAB — BASIC METABOLIC PANEL WITH GFR
BUN: 16 mg/dL (ref 7–25)
CHLORIDE: 104 mmol/L (ref 98–110)
CO2: 21 mmol/L (ref 20–31)
CREATININE: 1.16 mg/dL (ref 0.70–1.33)
Calcium: 8.9 mg/dL (ref 8.6–10.3)
GFR, EST NON AFRICAN AMERICAN: 72 mL/min (ref >=60)
GFR, Est African American: 83 mL/min (ref >=60)
Glucose, Bld: 82 mg/dL (ref 65–99)
POTASSIUM: 3.9 mmol/L (ref 3.5–5.3)
SODIUM: 138 mmol/L (ref 135–146)

## 2016-02-07 MED ORDER — KETOROLAC TROMETHAMINE 30 MG/ML IJ SOLN
30.0000 mg | Freq: Once | INTRAMUSCULAR | Status: AC
  Administered 2016-02-07: 30 mg via INTRAMUSCULAR

## 2016-02-07 MED ORDER — CLINDAMYCIN HCL 300 MG PO CAPS: 300.0000 mg | ORAL_CAPSULE | Freq: Three times a day (TID) | ORAL | Status: DC

## 2016-02-07 NOTE — Progress Notes (Signed)
Subjective:    Patient ID: Daniel Joseph, male    DOB: 02/04/1961, 55 y.o.   MRN: [id number]  HPI Daniel Joseph, 55 year old with a history of accelerated hypertension presents for a 3 month follow-up. Patient here for follow-up of elevated blood pressure. He is not exercising and is not adherent to low salt diet. He does not check blood pressure at home. He continues to follow up with cardiology as scheduled.   Patient denies chest pain, dyspnea, fatigue, palpitations, syncope and tachypnea.  Cardiovascular risk factors include: obesity (BMI >= 30 kg/m2) and sedentary lifestyle.   Daniel Joseph is also complaining of dental pain over the past several weeks. He says that pain is primarily to right upper teeth. He has several cavities and broken teeth present. He has had swelling to right face that is relieved by cool compresses. Dental pain is worsened by hot/cold foods, chewing, and lying down. He says that he has taken OTC asprin and Ibuprofen with moderate relief. Current pain intensity is 6/10 described as intermittent and throbbing.   Past Medical History  Diagnosis Date  . Hypertension     Renal Artery [place] 6/16:  No RAS, No AAA  . Mitral valve regurgitation 01/21/2014  . 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 Accelerated hypertension 01/19/2014  . Chronic combined systolic and diastolic CHF (congestive heart failure) ([place]) 01/21/2014  . 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 Cardiomyopathy, dilated, nonischemic [facility])    Social History   Social History  . Marital Status: Single    Spouse Name: N/A  . Number of Children: N/A  . Years of Education: N/A   Occupational History  . Not on file.   Social History Main Topics  . Smoking status: Never Smoker   . Smokeless tobacco: Never Used  . Alcohol Use: Yes     Comment: social  . Drug Use: No  . Sexual Activity: Not on file   Other Topics Concern  . Not on file   Social History Narrative   Review of Systems   Constitutional: Negative.   HENT: Positive for dental problem.   Eyes: Positive for redness.  Respiratory: Negative.   Cardiovascular: Negative.   Gastrointestinal: Negative.   Endocrine: Negative for polydipsia, polyphagia and polyuria.  Musculoskeletal: Negative.   Skin: Negative.   Neurological: Negative for tremors.  Hematological: Negative.   Psychiatric/Behavioral: Negative.        Objective:   Physical Exam  Constitutional: He is oriented to person, place, and time. He appears well-developed and well-nourished.  HENT:  Head: Normocephalic and atraumatic.  Right Ear: Hearing, external ear and ear canal normal.  Nose: Right sinus exhibits no maxillary sinus tenderness and no frontal sinus tenderness. Left sinus exhibits no maxillary sinus tenderness and no frontal sinus tenderness.  Mouth/Throat: Uvula is midline, oropharynx is clear and moist and mucous membranes are normal. Abnormal dentition (gum inflammation/swelling/erythematous). Dental caries present.  Eyes: Conjunctivae and EOM are normal. Pupils are equal, round, and reactive to light.  Neck: Normal range of motion. Neck supple.  Cardiovascular: Normal rate, regular rhythm, normal heart sounds and intact distal pulses.   Pulmonary/Chest: Effort normal and breath sounds normal.  Abdominal: Soft. Bowel sounds are normal.  Musculoskeletal:       Left ankle: Achilles tendon normal.  Neurological: He is alert and oriented to person, place, and time. He has normal reflexes.  Skin: Skin is warm and dry.  Psychiatric: He has  a normal mood and affect. His behavior is normal. Judgment and thought content normal.      BP 142/96 mmHg  Pulse 79  Temp(Src) 98.1 F (36.7 C) (Oral)  Resp 16  Ht 6\' 1"  (1.854 m)  Wt 237 lb (107.502 kg)  BMI 31.27 kg/m2  SpO2 99% Assessment & Plan:   1. Essential hypertension Blood pressure is at goal on current medication regimen. Will continue medications at current dosage.  - Urinalysis  Dipstick - Microalbumin/Creatinine Ratio, Urine - BASIC METABOLIC PANEL WITH GFR  2. CKD (chronic kidney disease), stage III  - Urinalysis Dipstick - BASIC METABOLIC PANEL WITH GFR  3. Pain, dental  - clindamycin (CLEOCIN) 300 MG capsule; Take 1 capsule (300 mg total) by mouth 3 (three) times daily.  Dispense: 30 capsule; Refill: 0 - ketorolac (TORADOL) 30 MG/ML injection 30 mg; Inject 1 mL (30 mg total) into the muscle once.  4. Gum inflammation  - clindamycin (CLEOCIN) 300 MG capsule; Take 1 capsule (300 mg total) by mouth 3 (three) times daily.  Dispense: 30 capsule; Refill: 0 - ketorolac (TORADOL) 30 MG/ML injection 30 mg; Inject 1 mL (30 mg total) into the muscle once. - CBC with Differential   The patient was given clear instructions to go to ER or return to medical center if symptoms do not improve, worsen or new problems develop. The patient verbalized understanding. Will notify patient with laboratory results. Dorena Dew, FNP

## 2016-02-07 NOTE — Patient Instructions (Signed)

## 2016-02-08 LAB — CBC WITH DIFFERENTIAL/PLATELET
Basophils Absolute: 0 cells/uL (ref 0–200)
Basophils Relative: 0 %
Eosinophils Absolute: 192 cells/uL (ref 15–500)
Eosinophils Relative: 6 %
HCT: 44.9 % (ref 38.5–50.0)
Hemoglobin: 15.2 g/dL (ref 13.2–17.1)
Lymphocytes Relative: 38 %
Lymphs Abs: 1216 cells/uL (ref 850–3900)
MCH: 30.8 pg (ref 27.0–33.0)
MCHC: 33.9 g/dL (ref 32.0–36.0)
MCV: 90.9 fL (ref 80.0–100.0)
MPV: 12.2 fL (ref 7.5–12.5)
Monocytes Absolute: 352 cells/uL (ref 200–950)
Monocytes Relative: 11 %
Neutro Abs: 1440 cells/uL — ABNORMAL LOW (ref 1500–7800)
Neutrophils Relative %: 45 %
Platelets: 183 10*3/uL (ref 140–400)
RBC: 4.94 MIL/uL (ref 4.20–5.80)
RDW: 15 % (ref 11.0–15.0)
WBC: 3.2 10*3/uL — ABNORMAL LOW (ref 3.8–10.8)

## 2016-02-08 LAB — MICROALBUMIN / CREATININE URINE RATIO
Creatinine, Urine: 29 mg/dL (ref 20–370)
Microalb Creat Ratio: 14 mcg/mg creat (ref <30)
Microalb, Ur: 0.4 mg/dL

## 2016-02-20 MED FILL — CLINDAMYCIN HCL 300 MG CAP: 300 | 10 days supply | Qty: 30 | Fill #0

## 2016-03-03 ENCOUNTER — Emergency Department (HOSPITAL_COMMUNITY): Payer: Self-pay

## 2016-03-03 ENCOUNTER — Encounter (HOSPITAL_COMMUNITY): Payer: Self-pay

## 2016-03-03 ENCOUNTER — Emergency Department (HOSPITAL_COMMUNITY)
Admission: EM | Admit: 2016-03-03 | Discharge: 2016-03-03 | Disposition: A | Discharge status: Home / Self Care | Payer: Self-pay | Attending: Emergency Medicine | Admitting: Emergency Medicine

## 2016-03-03 DIAGNOSIS — M109 Gout, unspecified: Secondary | ICD-10-CM

## 2016-03-03 DIAGNOSIS — Z79899 Other long term (current) drug therapy: Secondary | ICD-10-CM | POA: Insufficient documentation

## 2016-03-03 DIAGNOSIS — M10022 Idiopathic gout, left elbow: Secondary | ICD-10-CM | POA: Insufficient documentation

## 2016-03-03 DIAGNOSIS — Z7982 Long term (current) use of aspirin: Secondary | ICD-10-CM | POA: Insufficient documentation

## 2016-03-03 DIAGNOSIS — I5042 Chronic combined systolic (congestive) and diastolic (congestive) heart failure: Secondary | ICD-10-CM | POA: Insufficient documentation

## 2016-03-03 DIAGNOSIS — I110 Hypertensive heart disease with heart failure: Secondary | ICD-10-CM | POA: Insufficient documentation

## 2016-03-03 HISTORY — DX: Gout, unspecified: M10.9

## 2016-03-03 LAB — CBC WITH DIFFERENTIAL/PLATELET
Basophils Absolute: 0 10*3/uL (ref 0.0–0.1)
Basophils Relative: 0 %
Eosinophils Absolute: 0.1 10*3/uL (ref 0.0–0.7)
Eosinophils Relative: 1 %
HCT: 42.9 % (ref 39.0–52.0)
Hemoglobin: 14.7 g/dL (ref 13.0–17.0)
Lymphocytes Relative: 21 %
Lymphs Abs: 1.3 10*3/uL (ref 0.7–4.0)
MCH: 30.4 pg (ref 26.0–34.0)
MCHC: 34.3 g/dL (ref 30.0–36.0)
MCV: 88.8 fL (ref 78.0–100.0)
Monocytes Absolute: 0.8 10*3/uL (ref 0.1–1.0)
Monocytes Relative: 12 %
Neutro Abs: 4 10*3/uL (ref 1.7–7.7)
Neutrophils Relative %: 66 %
Platelets: 150 10*3/uL (ref 150–400)
RBC: 4.83 MIL/uL (ref 4.22–5.81)
RDW: 12.9 % (ref 11.5–15.5)
WBC: 6.1 10*3/uL (ref 4.0–10.5)

## 2016-03-03 LAB — BASIC METABOLIC PANEL
Anion gap: 6 (ref 5–15)
BUN: 12 mg/dL (ref 6–20)
CO2: 25 mmol/L (ref 22–32)
Calcium: 8.8 mg/dL — ABNORMAL LOW (ref 8.9–10.3)
Chloride: 100 mmol/L — ABNORMAL LOW (ref 101–111)
Creatinine, Ser: 1.37 mg/dL — ABNORMAL HIGH (ref 0.61–1.24)
GFR calc Af Amer: >60 mL/min (ref >60)
GFR calc non Af Amer: 58 mL/min — ABNORMAL LOW (ref >60)
Glucose, Bld: 106 mg/dL — ABNORMAL HIGH (ref 65–99)
Potassium: 4.2 mmol/L (ref 3.5–5.1)
Sodium: 131 mmol/L — ABNORMAL LOW (ref 135–145)

## 2016-03-03 MED ORDER — OXYCODONE-ACETAMINOPHEN 5-325 MG PO TABS
1.0000 | ORAL_TABLET | Freq: Once | ORAL | Status: AC
  Administered 2016-03-03: 1 via ORAL
  Filled 2016-03-03: qty 1

## 2016-03-03 MED ORDER — NAPROXEN 500 MG PO TABS: 500.0000 mg | ORAL_TABLET | Freq: Two times a day (BID) | ORAL | Status: DC

## 2016-03-03 MED ORDER — OXYCODONE-ACETAMINOPHEN 5-325 MG PO TABS: 1.0000 | ORAL_TABLET | ORAL | Status: DC | PRN

## 2016-03-03 NOTE — ED Notes (Signed)
PT did not take BP meds this AM but plans to take meds when he gets home.

## 2016-03-03 NOTE — ED Provider Notes (Signed)
CSN: [id number]     Arrival date & time 03/03/16  [id number] History  By signing my name below, I, Daniel Joseph, attest that this documentation has been prepared under the direction and in the presence of [employer], PA-C. Electronically Signed: Eustaquio Joseph, ED Scribe. 03/03/2016. 11:18 AM.   Chief Complaint  Patient presents with  . elbow swelling/gout    The history is provided by the patient. No language interpreter was used.    HPI Comments: Daniel Joseph is a 55 y.o. male with PMHx HTN, CHF, and Gout, who presents to the Emergency Department complaining of gradual onset, constant, left elbow swelling that began yesterday. Pt also complains of throbbing pain to the area that began last night. The area feels warm to the touch per patient. He has hx of gout and states this feels similar although he has never had it in his elbow. He also complains of a subjective fever last night. Pt has been taking Tramadol without relief. Denies any other associated symptoms.   Past Medical History  Diagnosis Date  . Hypertension     Renal Artery [place] 6/16:  No RAS, No AAA  . Mitral valve regurgitation 01/21/2014  . 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 Accelerated hypertension 01/19/2014  . Chronic combined systolic and diastolic CHF (congestive heart failure) ([place]) 01/21/2014  . 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 Cardiomyopathy, dilated, nonischemic ([place])   . Gout    Past Surgical History  Procedure Laterality Date  . Abdominal surgery  at birth  . Tee without cardioversion N/A 01/25/2014    Procedure: TRANSESOPHAGEAL ECHOCARDIOGRAM (TEE);  Surgeon: [name], MD;  Location: [facility];  Service: Cardiovascular;  Laterality: N/A;  . Tee without cardioversion N/A 09/14/2014    Procedure: TRANSESOPHAGEAL ECHOCARDIOGRAM (TEE);  Surgeon: Sueanne Margarita, MD;  [facility] ENDOSCOPY;  Service: Cardiovascular;  Laterality: N/A;   Family History  Problem Relation Age of Onset  . Heart  Problems Mother   . Heart Problems Father   . Heart Problems Brother   . Stroke Mother   . Heart attack Father   . Heart attack Brother   . Heart failure Mother   . Hypertension Mother   . Hypertension Father   . Hypertension Brother    Social History  Substance Use Topics  . Smoking status: Never Smoker   . Smokeless tobacco: Never Used  . Alcohol Use: Yes     Comment: social    Review of Systems  All other systems reviewed and are negative.  Allergies  Review of patient's allergies indicates no known allergies.  Home Medications   Prior to Admission medications   Medication Sig Start Date End Date Taking? Authorizing Provider  amLODipine (NORVASC) 10 MG tablet TAKE 1 TABLET BY MOUTH DAILY 08/17/15   Sueanne Margarita, MD  aspirin EC 81 MG tablet Take 1 tablet (81 mg total) by mouth daily. 04/01/14   Deboraha Sprang, MD  benazepril (LOTENSIN) 40 MG tablet TAKE 1 TABLET BY MOUTH DAILY. 02/08/15   Sueanne Margarita, MD  cetirizine (ZYRTEC) 10 MG tablet TAKE ONE TABLET BY MOUTH ONCE DAILY 12/25/15   Dorena Dew, FNP  clindamycin (CLEOCIN) 300 MG capsule Take 1 capsule (300 mg total) by mouth 3 (three) times daily. 02/07/16   Dorena Dew, FNP  cloNIDine (CATAPRES) 0.2 MG tablet Take 0.2 mg by mouth daily.  10/18/15   Historical Provider, MD  doxazosin (CARDURA) 4  MG tablet Take 1.5 tablets (6 mg total) by mouth at bedtime. 11/15/15   Sueanne Margarita, MD  fluticasone (FLONASE) 50 MCG/ACT nasal spray Place 2 sprays into both nostrils daily. 06/01/15   Micheline Chapman, NP  furosemide (LASIX) 40 MG tablet Take 40 mg by mouth daily.    Historical Provider, MD  hydrALAZINE (APRESOLINE) 50 MG tablet Take 2 tablets (100 mg total) by mouth 3 (three) times daily. 12/26/14   Liliane Shi, PA-C  ibuprofen (ADVIL,MOTRIN) 200 MG tablet Take 400 mg by mouth every 6 (six) hours as needed.    Historical Provider, MD  isosorbide mononitrate (IMDUR) 30 MG 24 hr tablet Take 1 tablet (30 mg total) by  mouth daily. 03/29/14   Sueanne Margarita, MD  labetalol (NORMODYNE) 300 MG tablet Take 300 mg by mouth daily.    Historical Provider, MD  naproxen (NAPROSYN) 500 MG tablet Take 1 tablet (500 mg total) by mouth 2 (two) times daily. 03/03/16   Okey Regal, PA-C  oxyCODONE-acetaminophen (PERCOCET/ROXICET) 5-325 MG tablet Take 1 tablet by mouth every 4 (four) hours as needed for severe pain. 03/03/16   Okey Regal, PA-C  spironolactone (ALDACTONE) 25 MG tablet Take 0.5 tablets (12.5 mg total) by mouth daily. 02/20/15   Liliane Shi, PA-C  traMADol (ULTRAM) 50 MG tablet Take 1 tablet (50 mg total) by mouth every 6 (six) hours as needed. 11/30/15   Micheline Chapman, NP   BP 167/108 mmHg  Pulse 72  Temp(Src) 98.7 F (37.1 C) (Oral)  Resp 18  SpO2 99%   Physical Exam  Constitutional: He is oriented to person, place, and time. He appears well-developed and well-nourished. No distress.  HENT:  Head: Normocephalic and atraumatic.  Eyes: Conjunctivae and EOM are normal.  Neck: Neck supple. No tracheal deviation present.  Cardiovascular: Normal rate.   Pulmonary/Chest: Effort normal. No respiratory distress.  Musculoskeletal: He exhibits edema and tenderness.  Hot, swollen left elbow. TTP. Unable to range.   Neurological: He is alert and oriented to person, place, and time.  Skin: Skin is warm and dry.  Psychiatric: He has a normal mood and affect. His behavior is normal.  Nursing note and vitals reviewed.   ED Course  Procedures (including critical care time)  DIAGNOSTIC STUDIES: Oxygen Saturation is 98% on RA, normal by my interpretation.    COORDINATION OF CARE: 9:21 AM-Discussed treatment plan which includes DG L Elbow with pt at bedside and pt agreed to plan.   Labs Review Labs Reviewed  BASIC METABOLIC PANEL - Abnormal; Notable for the following:    Sodium 131 (*)    Chloride 100 (*)    Glucose, Bld 106 (*)    Creatinine, Ser 1.37 (*)    Calcium 8.8 (*)    GFR calc non Af  Amer 58 (*)    All other components within normal limits  CBC WITH DIFFERENTIAL/PLATELET    Imaging Review Dg Elbow Complete Left  03/03/2016  CLINICAL DATA:  Posterior left elbow pain and swelling. No known injury. EXAM: LEFT ELBOW - COMPLETE 3+ VIEW COMPARISON:  None. FINDINGS: There is no evidence of fracture, or dislocation. There is no evidence of arthropathy or other focal bone abnormality. There is small joint effusion. Posterior soft tissue swelling is also a seen. IMPRESSION: No acute fracture or dislocation identified about the left elbow. Small anterior and posterior joint effusion. This may be associated with internal derangement or infectious/inflammatory joint disease. Electronically Signed   By: Thomas Hoff  Dimitrova M.D.   On: 03/03/2016 10:38   I have personally reviewed and evaluated these images and lab results as part of my medical decision-making.   EKG Interpretation None      MDM   Final diagnoses:  Acute gout of left elbow, unspecified cause   Labs: CBC, BMP  Imaging: DG L Elbow  Consults: Discussed case with Dr. Vanita Panda who suggested due to pt's normal WBC count that he should be treated for gout and discharged home.   Therapeutics: 5-325 mg Percocet  Discharge Meds: Ibuprofen and Percocet  Assessment/Plan:  Patient presents with likely gout of his elbow. He has a history gout in his toe, reports this feels similar. He has no redness, he is warm to touch, decreased range of motion, significant tenderness to light palpation. Patient was given pain medication here, labs drawn. Plain films ordered.  Upon reevaluation patient is sleeping on exam bed with his elbow flexed, leaning on the exam bed. Symptoms have improved, he has no elevation of white blood cells, is afebrile and nontoxic. I have low suspicion for septic arthritis in this patient with a previous history gout with similar presentation. Patient will be started on naproxen, Percocet as needed for  pain. Patient does have a history of hypertension or heart failure, this is well controlled, patient has taken NSAIDs in the past without difficulty. He is instructed to take for 2 days, if no improvement in symptoms follow-up with his primary care, if symptoms resolve, continue naproxen for 2 more days. Patient is given strict return precautions the event new worsening signs or symptoms present. She verbalized understanding and agreement to today's plan had no further questions or concerns    I personally performed the services described in this documentation, which was scribed in my presence. The recorded information has been reviewed and is accurate.      Okey Regal, PA-C 03/03/16 [place], MD 03/04/16 [phone]

## 2016-03-03 NOTE — ED Notes (Signed)
Patient here with 1 day of left elbow swelling, denies trauma reports its his gout acting up.

## 2016-03-03 NOTE — Discharge Instructions (Signed)
You have been diagnosed with gout today. Treatment of gout includes non-steroidal anti-inflammatory drugs. Please use naproxen twice daily for the next 2 days, if you have resolution of symptoms continue using for 2 more days and then discontinue. If after 2 days of therapy you continue to have elbow pain please follow-up with her primary care provider for reevaluation. Please do not take NSAIDs for extended period, if you experience any concerning signs or symptoms please discontinue using them and return to the emergency room for further evaluation Gout Gout is an inflammatory arthritis caused by a buildup of uric acid crystals in the joints. Uric acid is a chemical that is normally present in the blood. When the level of uric acid in the blood is too high it can form crystals that deposit in your joints and tissues. This causes joint redness, soreness, and swelling (inflammation). Repeat attacks are common. Over time, uric acid crystals can form into masses (tophi) near a joint, destroying bone and causing disfigurement. Gout is treatable and often preventable. CAUSES  The disease begins with elevated levels of uric acid in the blood. Uric acid is produced by your body when it breaks down a naturally found substance called purines. Certain foods you eat, such as meats and fish, contain high amounts of purines. Causes of an elevated uric acid level include:  Being passed down from parent to child (heredity).  Diseases that cause increased uric acid production (such as obesity, psoriasis, and certain cancers).  Excessive alcohol use.  Diet, especially diets rich in meat and seafood.  Medicines, including certain cancer-fighting medicines (chemotherapy), water pills (diuretics), and aspirin.  Chronic kidney disease. The kidneys are no longer able to remove uric acid well.  Problems with metabolism. Conditions strongly associated with gout include:  Obesity.  High blood pressure.  High  cholesterol.  Diabetes. Not everyone with elevated uric acid levels gets gout. It is not understood why some people get gout and others do not. Surgery, joint injury, and eating too much of certain foods are some of the factors that can lead to gout attacks. SYMPTOMS   An attack of gout comes on quickly. It causes intense pain with redness, swelling, and warmth in a joint.  Fever can occur.  Often, only one joint is involved. Certain joints are more commonly involved:  Base of the big toe.  Knee.  Ankle.  Wrist.  Finger. Without treatment, an attack usually goes away in a few days to weeks. Between attacks, you usually will not have symptoms, which is different from many other forms of arthritis. DIAGNOSIS  Your caregiver will suspect gout based on your symptoms and exam. In some cases, tests may be recommended. The tests may include:  Blood tests.  Urine tests.  X-rays.  Joint fluid exam. This exam requires a needle to remove fluid from the joint (arthrocentesis). Using a microscope, gout is confirmed when uric acid crystals are seen in the joint fluid. TREATMENT  There are two phases to gout treatment: treating the sudden onset (acute) attack and preventing attacks (prophylaxis).  Treatment of an Acute Attack.  Medicines are used. These include anti-inflammatory medicines or steroid medicines.  An injection of steroid medicine into the affected joint is sometimes necessary.  The painful joint is rested. Movement can worsen the arthritis.  You may use warm or cold treatments on painful joints, depending which works best for you.  Treatment to Prevent Attacks.  If you suffer from frequent gout attacks, your caregiver may advise  preventive medicine. These medicines are started after the acute attack subsides. These medicines either help your kidneys eliminate uric acid from your body or decrease your uric acid production. You may need to stay on these medicines for a  very long time.  The early phase of treatment with preventive medicine can be associated with an increase in acute gout attacks. For this reason, during the first few months of treatment, your caregiver may also advise you to take medicines usually used for acute gout treatment. Be sure you understand your caregiver's directions. Your caregiver may make several adjustments to your medicine dose before these medicines are effective.  Discuss dietary treatment with your caregiver or dietitian. Alcohol and drinks high in sugar and fructose and foods such as meat, poultry, and seafood can increase uric acid levels. Your caregiver or dietitian can advise you on drinks and foods that should be limited. HOME CARE INSTRUCTIONS   Do not take aspirin to relieve pain. This raises uric acid levels.  Only take over-the-counter or prescription medicines for pain, discomfort, or fever as directed by your caregiver.  Rest the joint as much as possible. When in bed, keep sheets and blankets off painful areas.  Keep the affected joint raised (elevated).  Apply warm or cold treatments to painful joints. Use of warm or cold treatments depends on which works best for you.  Use crutches if the painful joint is in your leg.  Drink enough fluids to keep your urine clear or pale yellow. This helps your body get rid of uric acid. Limit alcohol, sugary drinks, and fructose drinks.  Follow your dietary instructions. Pay careful attention to the amount of protein you eat. Your daily diet should emphasize fruits, vegetables, whole grains, and fat-free or low-fat milk products. Discuss the use of coffee, vitamin C, and cherries with your caregiver or dietitian. These may be helpful in lowering uric acid levels.  Maintain a healthy body weight. SEEK MEDICAL CARE IF:   You develop diarrhea, vomiting, or any side effects from medicines.  You do not feel better in 24 hours, or you are getting worse. SEEK IMMEDIATE MEDICAL  CARE IF:   Your joint becomes suddenly more tender, and you have chills or a fever. MAKE SURE YOU:   Understand these instructions.  Will watch your condition.  Will get help right away if you are not doing well or get worse.   This information is not intended to replace advice given to you by your health care provider. Make sure you discuss any questions you have with your health care provider.   Document Released: 08/30/2000 Document Revised: 09/23/2014 Document Reviewed: 04/15/2012 Elsevier Interactive Patient Education [employer].

## 2016-03-03 NOTE — ED Provider Notes (Deleted)
MSE was initiated and I personally evaluated and placed orders (if any) at 9:21 AM on 03/03/2016   The patient appears stable so that the remainder of the MSE may be completed by another provider.   Subjective:  The history is provided by the patient. No language interpreter was used.   Daniel Joseph is a 55 y.o. male with PMHx HTN, CHF, and Gout, who presents to the Emergency Department complaining of gradual onset, constant, left elbow swelling that began yesterday. Pt also complains of throbbing pain to the area that began last night. The area feels warm to the touch per patient. He has hx of gout and states this feels similar although he has never had it in his elbow. He also complains of a subjective fever last night. Pt has been taking Tramadol without relief. Denies any other associated symptoms.    Objective:  Constitutional: Pt is oriented to person, place, and time. Pt appears well-developed and well-nourished. No distress.  HENT:  Head: Normocephalic and atraumatic.  Eyes: Conjunctivae are normal.  Cardiovascular: Normal rate.  Pulmonary/Chest: Effort normal.  Abdominal: Pt exhibits no distension.  Musculoskeletal: hot, swollen left elbow. TTP. Unable to range.  Neurological: Pt is alert and oriented to person, place, and time.  Skin: Skin is warm and dry.  Psychiatric: Pt has a normal mood and affect.  Nursing note and vitals reviewed.    Assessment and Plan:  Pt was seen in fast track. No hx of gout in elbow. It is warm and unable to range, significant pain. He will need further work up. Resources unavailable in fast track. Placed initial orders and will have pt assessed in higher acuity department.   By signing my name below, I, Eustaquio Maize, attest that this documentation has been prepared under the direction and in the presence of non-physician practitioner, Lenn Sink, PA-C.  Electronically Signed: Eustaquio Maize, Scribe. 03/03/2016. 9:21 AM.   I personally  performed the services described in this documentation, which was scribed in my presence. The recorded information has been reviewed and is accurate.     Okey Regal, PA-C 03/03/16 [phone]

## 2016-03-03 NOTE — ED Notes (Signed)
Declined W/C at D/C and was escorted to lobby by RN. 

## 2016-03-06 ENCOUNTER — Encounter: Payer: Self-pay | Admitting: Family Medicine

## 2016-03-06 ENCOUNTER — Ambulatory Visit (INDEPENDENT_AMBULATORY_CARE_PROVIDER_SITE_OTHER): Payer: No Typology Code available for payment source | Admitting: Family Medicine

## 2016-03-06 VITALS — BP 148/92 | HR 80 | Temp 97.5°F | Resp 16 | Ht 73.0 in | Wt 236.0 lb

## 2016-03-06 DIAGNOSIS — M10022 Idiopathic gout, left elbow: Secondary | ICD-10-CM

## 2016-03-06 DIAGNOSIS — M109 Gout, unspecified: Secondary | ICD-10-CM | POA: Insufficient documentation

## 2016-03-06 DIAGNOSIS — M25522 Pain in left elbow: Secondary | ICD-10-CM | POA: Insufficient documentation

## 2016-03-06 LAB — URIC ACID: Uric Acid, Serum: 7.8 mg/dL (ref 4.0–8.0)

## 2016-03-06 MED ORDER — NAPROXEN 500 MG PO TABS: 500.0000 mg | ORAL_TABLET | Freq: Two times a day (BID) | ORAL | Status: DC

## 2016-03-06 MED ORDER — KETOROLAC TROMETHAMINE 60 MG/2ML IM SOLN
60.0000 mg | Freq: Once | INTRAMUSCULAR | Status: AC
  Administered 2016-03-06: 60 mg via INTRAMUSCULAR

## 2016-03-06 NOTE — Progress Notes (Signed)
Subjective:    Patient ID: Daniel Joseph, male    DOB: 1961/06/22, 55 y.o.   MRN: [id number]  Arm Pain  The pain is present in the left elbow (History of gout, was evaluated in the ER on 03/03/2016). The quality of the pain is described as aching and shooting. The pain does not radiate. The pain is at a severity of 6/10. The pain is moderate. The pain has been constant since the incident. Pertinent negatives include no chest pain, muscle weakness, numbness or tingling. The symptoms are aggravated by movement. He has tried NSAIDs and rest (He was given Naproxen 500 mg BID and Percocet 5-325 with minimal relief. ) for the symptoms. The treatment provided mild relief.   Past Medical History  Diagnosis Date  . Hypertension     Renal Artery [place] 6/16:  No RAS, No AAA  . Mitral valve regurgitation 01/21/2014  . 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 Accelerated hypertension 01/19/2014  . Chronic combined systolic and diastolic CHF (congestive heart failure) ([place]) 01/21/2014  . 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 Cardiomyopathy, dilated, nonischemic ([place])   . Gout    Social History   Social History  . Marital Status: Single    Spouse Name: N/A  . Number of Children: N/A  . Years of Education: N/A   Occupational History  . Not on file.   Social History Main Topics  . Smoking status: Never Smoker   . Smokeless tobacco: Never Used  . Alcohol Use: Yes     Comment: social  . Drug Use: No  . Sexual Activity: Not on file   Other Topics Concern  . Not on file   Social History Narrative  No Known Allergies    Review of Systems  Constitutional: Negative.   HENT: Negative.   Eyes: Negative.   Respiratory: Negative.   Cardiovascular: Negative.  Negative for chest pain.  Gastrointestinal: Negative.   Endocrine: Negative.   Genitourinary: Negative.   Musculoskeletal: Negative.        Left elbow pain  Skin: Negative.   Allergic/Immunologic: Negative.   Neurological:  Negative.  Negative for tingling and numbness.  Hematological: Negative.   Psychiatric/Behavioral: Negative.        Objective:   Physical Exam  Constitutional: He is oriented to person, place, and time. He appears well-developed and well-nourished.  HENT:  Head: Normocephalic and atraumatic.  Right Ear: External ear normal.  Left Ear: External ear normal.  Nose: Nose normal.  Mouth/Throat: Oropharynx is clear and moist.  Eyes: Conjunctivae and EOM are normal. Pupils are equal, round, and reactive to light.  Neck: Normal range of motion. Neck supple.  Abdominal: Soft. Bowel sounds are normal.  Musculoskeletal:       Left elbow: He exhibits decreased range of motion and swelling. Tenderness found.  Neurological: He is alert and oriented to person, place, and time. He has normal reflexes.  Skin: Skin is warm and dry.  Psychiatric: He has a normal mood and affect. His behavior is normal. Judgment and thought content normal.      BP 148/92 mmHg  Pulse 80  Temp(Src) 97.5 F (36.4 C) (Oral)  Resp 16  Ht 6\' 1"  (1.854 m)  Wt 236 lb (107.049 kg)  BMI 31.14 kg/m2  SpO2 100%  Assessment & Plan:  1. Acute gout of left elbow, unspecified cause He states that gout flare began after eating lamb. Patient given written information on low purine  diet. He was prescribed Naproxen in the ER, but did not pick up medications until 03/05/2016. Recommend that he start anti inflammatory medication this afternoon. Will also check uric acid level. Will start allopurinol after reviewing uric acid level.  - Uric Acid - ketorolac (TORADOL) injection 60 mg; Inject 2 mLs (60 mg total) into the muscle once. - naproxen (NAPROSYN) 500 MG tablet; Take 1 tablet (500 mg total) by mouth 2 (two) times daily.  Dispense: 30 tablet; Refill: 0  2. Left elbow pain - ketorolac (TORADOL) injection 60 mg; Inject 2 mLs (60 mg total) into the muscle once.   - naproxen (NAPROSYN) 500 MG tablet; Take 1 tablet (500 mg total)  by mouth 2 (two) times daily.  Dispense: 30 tablet    RTC: As previously scheduled for hypertension. Will re-check uric acid, creatinine, and GFR in 1 month.     Dorena Dew, FNP

## 2016-03-11 ENCOUNTER — Telehealth: Payer: Self-pay

## 2016-03-11 NOTE — Telephone Encounter (Signed)
-----   Message from Dorena Dew, [place] sent at 03/08/2016 11:04 AM EDT ----- Regarding: lab results Please inform patient that uric acid level is normal, we will not start medication at this juncture. Avoid foods that are high in purine (seafood, lamb, beer, alcohol, and canned meats) to prevent gout flare. Also, increase water intake to 6-8 glasses.  Please cancel lab appointment for next month.   Thanks ----- Message -----    From: Lab in [facility]    Sent: 03/06/2016  10:47 PM      To: Dorena Dew, FNP

## 2016-03-11 NOTE — Telephone Encounter (Signed)
Called to advised patient of labs, no answer. Left message asking patient to call back and left call back information. Thanks!~

## 2016-03-11 NOTE — Telephone Encounter (Signed)
Patient called back, i advised of labs and to avoid foods that are high in purine and gave examples. [place] lab appointment for next month and advised patient to keep regular appointment in August. Thanks!

## 2016-03-22 ENCOUNTER — Other Ambulatory Visit: Payer: Self-pay | Admitting: Physician Assistant

## 2016-03-22 ENCOUNTER — Other Ambulatory Visit: Payer: Self-pay | Admitting: Cardiology

## 2016-03-22 ENCOUNTER — Other Ambulatory Visit: Payer: Self-pay

## 2016-03-22 MED ORDER — BENAZEPRIL HCL 40 MG PO TABS: 40.0000 mg | ORAL_TABLET | Freq: Every day | ORAL | Status: DC

## 2016-03-22 MED ORDER — FUROSEMIDE 40 MG PO TABS: 40.0000 mg | ORAL_TABLET | Freq: Every day | ORAL | Status: DC

## 2016-03-22 MED FILL — BENAZEPRIL HCL 40 MG TABLET: 40 | 30 days supply | Qty: 30 | Fill #0

## 2016-03-22 MED FILL — SPIRONOLACTONE 25 MG TABLET: 25 | 60 days supply | Qty: 30 | Fill #0

## 2016-03-22 MED FILL — AMLODIPINE BESYLATE 10 MG T: 10 | 30 days supply | Qty: 30 | Fill #3

## 2016-03-22 MED FILL — FUROSEMIDE 40 MG TABLET: 40 | 30 days supply | Qty: 30 | Fill #0

## 2016-03-22 MED FILL — FLUTICASONE PROP 50 MCG SPR: 50 | 30 days supply | Qty: 16 | Fill #4

## 2016-03-22 MED FILL — ?CLONIDINE HCL 0.2 MG TABLE: 0.2 | 28 days supply | Qty: 56 | Fill #4

## 2016-03-22 MED FILL — ?CETIRIZINE HCL 10 MG TABLE: 10 | 30 days supply | Qty: 30 | Fill #3

## 2016-03-22 MED FILL — hydrALAZINE HCL 50 MG TABS: 50 | 30 days supply | Qty: 180 | Fill #0

## 2016-04-04 ENCOUNTER — Encounter: Payer: Self-pay | Admitting: Cardiology

## 2016-04-05 ENCOUNTER — Other Ambulatory Visit: Payer: No Typology Code available for payment source

## 2016-05-09 ENCOUNTER — Ambulatory Visit: Payer: No Typology Code available for payment source | Admitting: Family Medicine

## 2016-05-09 ENCOUNTER — Other Ambulatory Visit: Payer: Self-pay | Admitting: Family Medicine

## 2016-05-09 DIAGNOSIS — M25522 Pain in left elbow: Secondary | ICD-10-CM

## 2016-05-09 DIAGNOSIS — M109 Gout, unspecified: Secondary | ICD-10-CM

## 2016-05-09 MED ORDER — NAPROXEN 500 MG PO TABS
500.0000 mg | ORAL_TABLET | Freq: Two times a day (BID) | ORAL | 0 refills | Status: DC
Start: 2016-05-09 — End: 2016-07-01

## 2016-05-16 ENCOUNTER — Ambulatory Visit: Payer: Self-pay | Admitting: Cardiology

## 2016-05-31 ENCOUNTER — Encounter: Payer: Self-pay | Admitting: Cardiology

## 2016-06-05 MED FILL — BENAZEPRIL HCL 40 MG TABLET: 40 | 30 days supply | Qty: 30 | Fill #1

## 2016-06-05 MED FILL — AMLODIPINE BESYLATE 10 MG T: 10 | 30 days supply | Qty: 30 | Fill #4

## 2016-06-05 MED FILL — ?FUROSEMIDE 40 MG TABLET: 40 | 30 days supply | Qty: 30 | Fill #1

## 2016-06-13 ENCOUNTER — Ambulatory Visit: Payer: Self-pay | Admitting: Cardiology

## 2016-06-17 ENCOUNTER — Ambulatory Visit: Payer: No Typology Code available for payment source | Admitting: Family Medicine

## 2016-07-01 ENCOUNTER — Ambulatory Visit (INDEPENDENT_AMBULATORY_CARE_PROVIDER_SITE_OTHER): Payer: No Typology Code available for payment source | Admitting: Family Medicine

## 2016-07-01 ENCOUNTER — Encounter: Payer: Self-pay | Admitting: Family Medicine

## 2016-07-01 VITALS — BP 162/111 | HR 77 | Temp 97.6°F | Resp 15 | Ht 73.0 in | Wt 235.0 lb

## 2016-07-01 DIAGNOSIS — I10 Essential (primary) hypertension: Secondary | ICD-10-CM

## 2016-07-01 DIAGNOSIS — M25522 Pain in left elbow: Secondary | ICD-10-CM

## 2016-07-01 DIAGNOSIS — Z23 Encounter for immunization: Secondary | ICD-10-CM

## 2016-07-01 DIAGNOSIS — M109 Gout, unspecified: Secondary | ICD-10-CM

## 2016-07-01 DIAGNOSIS — Z1159 Encounter for screening for other viral diseases: Secondary | ICD-10-CM

## 2016-07-01 DIAGNOSIS — Z114 Encounter for screening for human immunodeficiency virus [HIV]: Secondary | ICD-10-CM

## 2016-07-01 DIAGNOSIS — Z131 Encounter for screening for diabetes mellitus: Secondary | ICD-10-CM

## 2016-07-01 DIAGNOSIS — Z1211 Encounter for screening for malignant neoplasm of colon: Secondary | ICD-10-CM

## 2016-07-01 LAB — COMPLETE METABOLIC PANEL WITH GFR
ALBUMIN: 4.1 g/dL (ref 3.6–5.1)
ALK PHOS: 55 U/L (ref 40–115)
ALT: 20 U/L (ref 9–46)
AST: 19 U/L (ref 10–35)
BUN: 13 mg/dL (ref 7–25)
CALCIUM: 9.1 mg/dL (ref 8.6–10.3)
CHLORIDE: 103 mmol/L (ref 98–110)
CO2: 24 mmol/L (ref 20–31)
Creat: 1.11 mg/dL (ref 0.70–1.33)
GFR, EST NON AFRICAN AMERICAN: 76 mL/min (ref >=60)
GFR, Est African American: 88 mL/min (ref >=60)
Glucose, Bld: 73 mg/dL (ref 65–99)
POTASSIUM: 3.9 mmol/L (ref 3.5–5.3)
SODIUM: 139 mmol/L (ref 135–146)
Total Bilirubin: 0.5 mg/dL (ref 0.2–1.2)
Total Protein: 7.5 g/dL (ref 6.1–8.1)

## 2016-07-01 LAB — LIPID PANEL
CHOL/HDL RATIO: 4.7 ratio (ref <=5.0)
CHOLESTEROL: 175 mg/dL (ref 125–200)
HDL: 37 mg/dL — ABNORMAL LOW (ref >=40)
LDL Cholesterol: 82 mg/dL (ref <130)
TRIGLYCERIDES: 281 mg/dL — AB (ref <150)
VLDL: 56 mg/dL — ABNORMAL HIGH (ref <30)

## 2016-07-01 LAB — CBC WITH DIFFERENTIAL/PLATELET
BASOS PCT: 0 %
Basophils Absolute: 0 cells/uL (ref 0–200)
EOS PCT: 6 %
Eosinophils Absolute: 168 cells/uL (ref 15–500)
HEMATOCRIT: 43 % (ref 38.5–50.0)
HEMOGLOBIN: 14.9 g/dL (ref 13.2–17.1)
LYMPHS ABS: 1428 {cells}/uL (ref 850–3900)
Lymphocytes Relative: 51 %
MCH: 31 pg (ref 27.0–33.0)
MCHC: 34.7 g/dL (ref 32.0–36.0)
MCV: 89.6 fL (ref 80.0–100.0)
MONO ABS: 280 {cells}/uL (ref 200–950)
MPV: 12.7 fL — ABNORMAL HIGH (ref 7.5–12.5)
Monocytes Relative: 10 %
NEUTROS ABS: 924 {cells}/uL — AB (ref 1500–7800)
NEUTROS PCT: 33 %
Platelets: 174 10*3/uL (ref 140–400)
RBC: 4.8 MIL/uL (ref 4.20–5.80)
RDW: 14.4 % (ref 11.0–15.0)
WBC: 2.8 10*3/uL — AB (ref 3.8–10.8)

## 2016-07-01 LAB — URIC ACID: Uric Acid, Serum: 8.5 mg/dL — ABNORMAL HIGH (ref 4.0–8.0)

## 2016-07-01 MED ORDER — NAPROXEN 500 MG PO TABS: 500.0000 mg | ORAL_TABLET | Freq: Two times a day (BID) | ORAL | 0 refills | Status: DC

## 2016-07-01 NOTE — Progress Notes (Signed)
Daniel Joseph, is a 55 y.o. male  [id number]  [id number]  DOB - May 30, 1961  CC:  Chief Complaint  Patient presents with  . Follow-up    gout is improved, sees cardiologist for CHF, patient would like flu vaccine       HPI: Daniel Joseph is a 55 y.o. male for follow-up on gout and requesting a flu shot. Patient has a history of accelerated hypertension, cardiomyopathy, CHF, mitral valve regurg, tricuspid valve mass.  He is followd by cardiology for cardiac conditions. He is on numerous BP medications. Despite this his BP today is 185/111, a repeat later 160/111.   He reports no gout symptoms in about 3 months.   No Known Allergies Past Medical History:  Diagnosis Date  . Accelerated hypertension 01/19/2014  . Cardiomyopathy, dilated, nonischemic ([place])   . Chronic combined systolic and diastolic CHF (congestive heart failure) ([place]) 01/21/2014  . Gout   . Hypertension    Renal Artery [place] 6/16:  No RAS, No AAA  . Mitral valve regurgitation 01/21/2014  . 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 amLODipine (NORVASC) 10 MG tablet TAKE 1 TABLET BY MOUTH DAILY 30 tablet 6  . aspirin EC 81 MG tablet Take 1 tablet (81 mg total) by mouth daily. 90 tablet 3  . benazepril (LOTENSIN) 40 MG tablet Take 1 tablet (40 mg total) by mouth daily. 30 tablet 2  . cetirizine (ZYRTEC) 10 MG tablet TAKE ONE TABLET BY MOUTH ONCE DAILY 30 tablet 0  . cloNIDine (CATAPRES) 0.2 MG tablet Take 0.2 mg by mouth daily.   3  . doxazosin (CARDURA) 4 MG tablet Take 1.5 tablets (6 mg total) by mouth at bedtime. 45 tablet 11  . fluticasone (FLONASE) 50 MCG/ACT nasal spray Place 2 sprays into both nostrils daily. 16 g 6  . hydrALAZINE (APRESOLINE) 50 MG tablet TAKE 2 TABLETS BY MOUTH 3 TIMES DAILY 540 tablet 0  . isosorbide mononitrate (IMDUR) 30 MG 24 hr tablet Take 1 tablet  (30 mg total) by mouth daily. 90 tablet 3  . labetalol (NORMODYNE) 300 MG tablet Take 300 mg by mouth daily. Reported on 03/06/2016    . spironolactone (ALDACTONE) 25 MG tablet TAKE 1/2 TABLET BY MOUTH DAILY 30 tablet 1  . furosemide (LASIX) 40 MG tablet Take 1 tablet (40 mg total) by mouth daily. (Patient not taking: Reported on 07/01/2016) 30 tablet 2  . oxyCODONE-acetaminophen (PERCOCET/ROXICET) 5-325 MG tablet Take 1 tablet by mouth every 4 (four) hours as needed for severe pain. (Patient not taking: Reported on 07/01/2016) 15 tablet 0   No current facility-administered medications on file prior to visit.    Family History  Problem Relation Age of Onset  . Heart Problems Mother   . Stroke Mother   . Heart failure Mother   . Hypertension Mother   . Heart Problems Father   . Heart attack Father   . Hypertension Father   . Heart Problems Brother   . Heart attack Brother   . Hypertension Brother    Social History   Social History  . Marital status: Single    Spouse name: N/A  . Number of children: N/A  . Years of education: N/A   Occupational History  . Not on file.   Social History Main Topics  . Smoking status: Never Smoker  . Smokeless tobacco:  Never Used  . Alcohol use No     Comment: social  . Drug use: No  . Sexual activity: Not on file   Other Topics Concern  . Not on file   Social History Narrative  . No narrative on file    Review of Systems: Constitutional: Negative Skin: Negative HENT: Negative  Eyes: + for intermittent blurry vision Neck: Negative Respiratory: + for shortness of breath with exertion Cardiovascular: + for occ palpitations and swelling of ankles Gastrointestinal: + for occ diarrhea Genitourinary: Negative  Musculoskeletal: + for low back pain Neurological: + for headaches Hematological: Negative  Psychiatric/Behavioral: Negative    Objective:   Vitals:   07/01/16 1323 07/01/16 1413  BP: (!) 185/111 (!) 162/111  Pulse: 76 77   Resp: 15   Temp: 97.6 F (36.4 C)     Physical Exam: Constitutional: Patient appears well-developed and well-nourished. No distress. HENT: Normocephalic, atraumatic, External right and left ear normal. Oropharynx is clear and moist.  Eyes: Conjunctivae and EOM are normal. PERRLA, no scleral icterus. Neck: Normal ROM. Neck supple. No lymphadenopathy, No thyromegaly. CVS: RRR, S1/S2 +, no murmurs, no gallops, no rubs Pulmonary: Effort and breath sounds normal, no stridor, rhonchi, wheezes, rales.  Abdominal: Soft. Normoactive BS,, no distension, tenderness, rebound or guarding.  Musculoskeletal: Normal range of motion. No edema and no tenderness.  Neuro: Alert.Normal muscle tone coordination. Non-focal Skin: Skin is warm and dry. No rash noted. Not diaphoretic. No erythema. No pallor. Psychiatric: Normal mood and affect. Behavior, judgment, thought content normal.  Lab Results  Component Value Date   WBC 6.1 03/03/2016   HGB 14.7 03/03/2016   HCT 42.9 03/03/2016   MCV 88.8 03/03/2016   PLT 150 03/03/2016   Lab Results  Component Value Date   CREATININE 1.37 (H) 03/03/2016   BUN 12 03/03/2016   NA 131 (L) 03/03/2016   K 4.2 03/03/2016   CL 100 (L) 03/03/2016   CO2 25 03/03/2016    No results found for: HGBA1C Lipid Panel     Component Value Date/Time   CHOL 139 01/20/2014 0048   TRIG 63 01/20/2014 0048   HDL 41 01/20/2014 0048   CHOLHDL 3.4 01/20/2014 0048   VLDL 13 01/20/2014 0048   LDLCALC 85 01/20/2014 0048       Assessment and plan:   1. Accelerated hypertension  - COMPLETE METABOLIC PANEL WITH GFR - CBC with Differential - Lipid panel I have asked him to call his cardiologist who is managing his hypertension.  2. Screening for HIV (human immunodeficiency virus)  - HIV antibody (with reflex)  3. Encounter for hepatitis C screening test for low risk patient  - Hepatitis C Antibody  4. Gout, unspecified cause, unspecified chronicity, unspecified  site  - Uric Acid RF of naproxen to be used if needed. 5. Screening for colon cancer  - POC Hemoccult Bld/Stl (3-Cd Home Screen); Future  6. Need for prophylactic vaccination and inoculation against influenza  - Flu Vaccine QUAD 36+ mos PF IM (Fluarix & Fluzone Quad PF)  7. Screening for diabetes mellitus  - Hemoglobin A1c    The patient was given clear instructions to go to ER or return to medical center if symptoms don't improve, worsen or new problems develop. The patient verbalized understanding.    Micheline Chapman FNP  07/01/2016, 3:26 PM

## 2016-07-02 LAB — HEMOGLOBIN A1C
HEMOGLOBIN A1C: 5.2 % (ref <5.7)
MEAN PLASMA GLUCOSE: 103 mg/dL

## 2016-07-02 LAB — HEPATITIS C ANTIBODY: HCV Ab: NEGATIVE

## 2016-07-02 LAB — HIV ANTIBODY (ROUTINE TESTING W REFLEX): HIV: NONREACTIVE

## 2016-07-03 ENCOUNTER — Telehealth: Payer: Self-pay | Admitting: Cardiology

## 2016-07-03 NOTE — Telephone Encounter (Signed)
New Message  Pt voiced he's not having any CP or SOB but advised he's wanting to speak to nurse.  Please f/u

## 2016-07-03 NOTE — Telephone Encounter (Signed)
Patient requests Dr. [name] to write a letter stating his cardiac condition to take to [employer] tomorrow for disability.  He states he is not asking Dr. [name] to sign off on disability, he just needs a letter to help his case.  To Dr. [name].

## 2016-07-04 NOTE — Telephone Encounter (Signed)
Per Dr. [name], informed patient she will not write him a letter for Disability.  Instructed him to try PCP. He was thankful for assistance.

## 2016-07-04 NOTE — Telephone Encounter (Signed)
Follow Up:     Pt says he was waiting to hear something from you. He said would you please call today before you go home if possible.

## 2016-08-07 NOTE — Telephone Encounter (Signed)
ERROR

## 2016-08-13 ENCOUNTER — Telehealth: Payer: Self-pay | Admitting: Cardiology

## 2016-08-13 ENCOUNTER — Other Ambulatory Visit: Payer: Self-pay | Admitting: *Deleted

## 2016-08-13 MED ORDER — HYDRALAZINE HCL 50 MG PO TABS: ORAL_TABLET | ORAL | 2 refills | Status: DC

## 2016-08-13 MED ORDER — SPIRONOLACTONE 25 MG PO TABS: 12.5000 mg | ORAL_TABLET | Freq: Every day | ORAL | 2 refills | Status: DC

## 2016-08-13 MED ORDER — AMLODIPINE BESYLATE 10 MG PO TABS: 10.0000 mg | ORAL_TABLET | Freq: Every day | ORAL | 2 refills | Status: DC

## 2016-08-13 MED ORDER — ISOSORBIDE MONONITRATE ER 30 MG PO TB24: 30.0000 mg | ORAL_TABLET | Freq: Every day | ORAL | 2 refills | Status: DC

## 2016-08-13 MED ORDER — BENAZEPRIL HCL 40 MG PO TABS: 40.0000 mg | ORAL_TABLET | Freq: Every day | ORAL | 2 refills | Status: DC

## 2016-08-13 MED ORDER — DOXAZOSIN MESYLATE 4 MG PO TABS: 6.0000 mg | ORAL_TABLET | Freq: Every day | ORAL | 2 refills | Status: DC

## 2016-08-13 MED ORDER — FUROSEMIDE 40 MG PO TABS: 40.0000 mg | ORAL_TABLET | Freq: Every day | ORAL | 2 refills | Status: DC

## 2016-08-13 NOTE — Telephone Encounter (Signed)
New message       *STAT* If patient is at the pharmacy, call can be transferred to refill team.   1. Which medications need to be refilled? (please list name of each medication and dose if known) spironolactine, isosorbide, hydralazine, furosemide, amlodipine, benazepril and doxazosin  2. Which pharmacy/location (including street and city if local pharmacy) is medication to be sent to? [facility] 3. Do they need a 30 day or 90 day supply? 30 day supply------we had to resc appt to 10-11-15 because dr turner is out sick.  Please refill meds to last until appt in jan.

## 2016-08-16 ENCOUNTER — Ambulatory Visit: Payer: Self-pay | Admitting: Cardiology

## 2016-08-22 MED FILL — FUROSEMIDE 40 MG TABLET: 40 | 30 days supply | Qty: 30 | Fill #0

## 2016-08-22 MED FILL — ?SPIRONOLACTONE 25 MG TABLE: 25 MG | 30 days supply | Qty: 15 | Fill #0

## 2016-08-22 MED FILL — ?DOXAZOSIN MESYLATE 4MG TAB: 4 | 30 days supply | Qty: 45 | Fill #0

## 2016-08-22 MED FILL — NAPROXEN 500 MG TABLET: 500 | 15 days supply | Qty: 30 | Fill #0

## 2016-08-22 MED FILL — BENAZEPRIL HCL 40 MG TABLET: 40 | 30 days supply | Qty: 30 | Fill #0

## 2016-08-22 MED FILL — AMLODIPINE BESYLATE 10 MG T: 10 | 30 days supply | Qty: 30 | Fill #0

## 2016-08-22 MED FILL — hydrALAZINE HCL 50 MG TABS: 50 | 30 days supply | Qty: 180 | Fill #0

## 2016-08-22 MED FILL — ?ISOSORBIDE MN ER 30 MG TAB: 30 | 30 days supply | Qty: 30 | Fill #0

## 2016-09-18 ENCOUNTER — Telehealth: Payer: Self-pay | Admitting: Cardiology

## 2016-09-18 NOTE — Telephone Encounter (Signed)
Daniel Joseph is requesting someone give him a call. thanks

## 2016-09-18 NOTE — Telephone Encounter (Signed)
Patient called to report he went to his PCP for back spasms but they did not give him anything for pain. He is calling to request pain medication. Confirmed with patient he has no cardiac issues at this time. Informed patient that Cardiology will not give pain medications. He was grateful for call.

## 2016-09-18 NOTE — Telephone Encounter (Signed)
Left message to call back  

## 2016-09-19 ENCOUNTER — Ambulatory Visit (INDEPENDENT_AMBULATORY_CARE_PROVIDER_SITE_OTHER): Payer: Self-pay | Admitting: Family Medicine

## 2016-09-19 ENCOUNTER — Encounter: Payer: Self-pay | Admitting: Family Medicine

## 2016-09-19 ENCOUNTER — Other Ambulatory Visit: Payer: Self-pay | Admitting: Family Medicine

## 2016-09-19 ENCOUNTER — Ambulatory Visit (HOSPITAL_COMMUNITY)
Admission: RE | Admit: 2016-09-19 | Discharge: 2016-09-19 | Disposition: A | Discharge status: Home / Self Care | Payer: No Typology Code available for payment source | Source: Ambulatory Visit | Attending: Family Medicine | Admitting: Family Medicine

## 2016-09-19 VITALS — BP 188/118 | HR 86 | Temp 97.9°F | Resp 15 | Ht 73.0 in | Wt 230.0 lb

## 2016-09-19 DIAGNOSIS — M545 Low back pain, unspecified: Secondary | ICD-10-CM

## 2016-09-19 DIAGNOSIS — M5136 Other intervertebral disc degeneration, lumbar region: Secondary | ICD-10-CM | POA: Insufficient documentation

## 2016-09-19 DIAGNOSIS — I10 Essential (primary) hypertension: Secondary | ICD-10-CM

## 2016-09-19 LAB — POCT URINALYSIS DIP (DEVICE)
BILIRUBIN URINE: NEGATIVE
GLUCOSE, UA: NEGATIVE mg/dL
Ketones, ur: NEGATIVE mg/dL
LEUKOCYTES UA: NEGATIVE
Nitrite: NEGATIVE
Protein, ur: 100 mg/dL — AB
SPECIFIC GRAVITY, URINE: 1.025 (ref 1.005–1.030)
UROBILINOGEN UA: 0.2 mg/dL (ref 0.0–1.0)
pH: 7 (ref 5.0–8.0)

## 2016-09-19 MED ORDER — TRAMADOL HCL 50 MG PO TABS: 50.0000 mg | ORAL_TABLET | Freq: Four times a day (QID) | ORAL | 0 refills | Status: DC | PRN

## 2016-09-19 MED ORDER — CYCLOBENZAPRINE HCL 5 MG PO TABS: 5.0000 mg | ORAL_TABLET | Freq: Three times a day (TID) | ORAL | 0 refills | Status: DC | PRN

## 2016-09-19 MED ORDER — KETOROLAC TROMETHAMINE 60 MG/2ML IM SOLN
60.0000 mg | Freq: Once | INTRAMUSCULAR | Status: AC
  Administered 2016-09-19: 60 mg via INTRAMUSCULAR

## 2016-09-19 MED FILL — traMADol HCL 50 MG TABS: 50 | 30 days supply | Qty: 30 | Fill #0

## 2016-09-19 MED FILL — CYCLOBENZAPRINE 5 MG TABLET: 5 | 10 days supply | Qty: 30 | Fill #0

## 2016-09-19 NOTE — Progress Notes (Signed)
Subjective:    Patient ID: Daniel Joseph, male    DOB: 30-Mar-1961, 56 y.o.   MRN: [id number]  Back Pain  This is a new problem. The current episode started in the past 7 days. The problem occurs constantly. The pain is present in the lumbar spine. The pain is at a severity of 10/10. The pain is moderate. Pertinent negatives include no bladder incontinence, bowel incontinence, chest pain, dysuria, headaches, leg pain, numbness, paresis or paresthesias.  Hypertension  This is a chronic (Blood pressure markedly elevated upon arrival. Patient is currently having 10/10 back pain) problem. The current episode started more than 1 year ago. The problem has been rapidly worsening since onset. The problem is uncontrolled. Pertinent negatives include no anxiety, chest pain, headaches, orthopnea, palpitations, peripheral edema, PND, shortness of breath or sweats.   Past Medical History:  Diagnosis Date  . Accelerated hypertension 01/19/2014  . Cardiomyopathy, dilated, nonischemic ([place])   . Chronic combined systolic and diastolic CHF (congestive heart failure) ([place]) 01/21/2014  . Gout   . Hypertension    Renal Artery [place] 6/16:  No RAS, No AAA  . Mitral valve regurgitation 01/21/2014  . 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 Marital status: Single    Spouse name: N/A  . Number of children: N/A  . Years of education: N/A   Occupational History  . Not on file.   Social History Main Topics  . Smoking status: Never Smoker  . Smokeless tobacco: Never Used  . Alcohol use No     Comment: social  . Drug use: No  . Sexual activity: Not on file   Other Topics Concern  . Not on file   Social History Narrative  . No narrative on file   Review of Systems  Constitutional: Negative.  Negative for fatigue and unexpected weight change.  HENT: Negative.   Eyes: Negative.  Negative for photophobia.  Respiratory:  Negative for shortness of breath.   Cardiovascular: Negative.  Negative for chest pain, palpitations, orthopnea and PND.  Gastrointestinal: Negative.  Negative for bowel incontinence.  Endocrine: Negative for cold intolerance, heat intolerance, polydipsia, polyphagia and polyuria.  Genitourinary: Negative.  Negative for bladder incontinence, decreased urine volume, difficulty urinating, dysuria, hematuria and urgency.  Musculoskeletal: Positive for back pain.  Skin: Negative.   Allergic/Immunologic: Negative for immunocompromised state.  Neurological: Negative for numbness, headaches and paresthesias.  Hematological: Negative.   Psychiatric/Behavioral: Negative.        Objective:   Physical Exam  Constitutional: He is oriented to person, place, and time. He appears well-developed and well-nourished.  HENT:  Head: Normocephalic and atraumatic.  Right Ear: External ear normal.  Left Ear: External ear normal.  Nose: Nose normal.  Mouth/Throat: Oropharynx is clear and moist.  Eyes: Conjunctivae and EOM are normal. Pupils are equal, round, and reactive to light.  Neck: Normal range of motion. Neck supple.  Cardiovascular: Normal rate, regular rhythm, normal heart sounds and intact distal pulses.   Pulmonary/Chest: Effort normal and breath sounds normal.  Abdominal: Soft. Bowel sounds are normal.  Musculoskeletal:       Lumbar back: He exhibits decreased range of motion, tenderness, pain and spasm. He exhibits no swelling and no edema.  Neurological: He is alert and oriented to person, place, and time. He has normal reflexes.  Skin: Skin is warm and dry.  Psychiatric: He has a normal  mood and affect. His behavior is normal. Judgment and thought content normal.      BP (!) 188/118 Comment: left arm manual  Pulse 86   Temp 97.9 F (36.6 C) (Oral)   Resp 15   Ht 6\' 1"  (1.854 m)   Wt 230 lb (104.3 kg)   SpO2 98%   BMI 30.34 kg/m  Assessment & Plan:  1. Acute midline low back pain  without sciatica Recommend that patient apply ice 20 minutes 4 times per day as needed. Also, given written information on heat therapy to use intermittently. Will review xray of lumbar spine as images become available.  - ketorolac (TORADOL) injection 60 mg; Inject 2 mLs (60 mg total) into the muscle once. - traMADol (ULTRAM) 50 MG tablet; Take 1 tablet (50 mg total) by mouth every 6 (six) hours as needed.  Dispense: 30 tablet; Refill: 0 - cyclobenzaprine (FLEXERIL) 5 MG tablet; Take 1 tablet (5 mg total) by mouth 3 (three) times daily as needed for muscle spasms.  Dispense: 30 tablet; Refill: 0 - DG Lumbar Spine Complete; Future  2. Essential hypertension Blood pressure is above goal on current medication regimen. Blood pressure is markedly elevated; however, patient is having 10/10 pain to lower back.  Recommend that Daniel Joseph return in 1 week for blood pressure check. Also, follow up with cardiology as scheduled.   RTC: 1 week for bp check.   Dorena Dew, FNP

## 2016-09-19 NOTE — Patient Instructions (Addendum)
Will start Tramadol 50 mg every 6 hours as needed for moderate to severe back pain. Do not drink alcohol, drive, or operate machinery while taking this medication.  Back Pain, Adult Introduction Back pain is very common. The pain often gets better over time. The cause of back pain is usually not dangerous. Most people can learn to manage their back pain on their own. Follow these instructions at home: Watch your back pain for any changes. The following actions may help to lessen any pain you are feeling:  Stay active. Start with short walks on flat ground if you can. Try to walk farther each day.  Exercise regularly as told by your doctor. Exercise helps your back heal faster. It also helps avoid future injury by keeping your muscles strong and flexible.  Do not sit, drive, or stand in one place for more than 30 minutes.  Do not stay in bed. Resting more than 1-2 days can slow down your recovery.  Be careful when you bend or lift an object. Use good form when lifting:  Bend at your knees.  Keep the object close to your body.  Do not twist.  Sleep on a firm mattress. Lie on your side, and bend your knees. If you lie on your back, put a pillow under your knees.  Take medicines only as told by your doctor.  Put ice on the injured area.  Put ice in a plastic bag.  Place a towel between your skin and the bag.  Leave the ice on for 20 minutes, 2-3 times a day for the first 2-3 days. After that, you can switch between ice and heat packs.  Avoid feeling anxious or stressed. Find good ways to deal with stress, such as exercise.  Maintain a healthy weight. Extra weight puts stress on your back. Contact a doctor if:  You have pain that does not go away with rest or medicine.  You have worsening pain that goes down into your legs or buttocks.  You have pain that does not get better in one week.  You have pain at night.  You lose weight.  You have a fever or chills. Get help  right away if:  You cannot control when you poop (bowel movement) or pee (urinate).  Your arms or legs feel weak.  Your arms or legs lose feeling (numbness).  You feel sick to your stomach (nauseous) or throw up (vomit).  You have belly (abdominal) pain.  You feel like you may pass out (faint). This information is not intended to replace advice given to you by your health care provider. Make sure you discuss any questions you have with your health care provider. Document Released: 02/19/2008 Document Revised: 02/08/2016 Document Reviewed: 01/04/2014  2017 Elsevier  Heat Therapy Introduction Heat therapy can help ease sore, stiff, injured, and tight muscles and joints. Heat relaxes your muscles, which may help ease your pain. Heat therapy should only be used on old, pre-existing, or long-lasting (chronic) injuries. Do not use heat therapy unless told by your doctor. How to use heat therapy There are several different kinds of heat therapy, including:  Moist heat pack.  Warm water bath.  Hot water bottle.  Electric heating pad.  Heated gel pack.  Heated wrap.  Electric heating pad. General heat therapy recommendations  Do not sleep while using heat therapy. Only use heat therapy while you are awake.  Your skin may turn pink while using heat therapy. Do not use heat therapy if  your skin turns red.  Do not use heat therapy if you have new pain.  High heat or long exposure to heat can cause burns. Be careful when using heat therapy to avoid burning your skin.  Do not use heat therapy on areas of your skin that are already irritated, such as with a rash or sunburn. Get help if:  You have blisters, redness, swelling (puffiness), or numbness.  You have new pain.  Your pain is worse. This information is not intended to replace advice given to you by your health care provider. Make sure you discuss any questions you have with your health care provider. Document Released:  11/25/2011 Document Revised: 02/08/2016 Document Reviewed: 10/26/2013  2017 Elsevier

## 2016-09-19 NOTE — Progress Notes (Signed)
Sent referral to [facility] for Ortho/Sports Med per Cammie Sickle. Reported xray and follow up advice to patient. He understands all.

## 2016-10-01 ENCOUNTER — Ambulatory Visit: Payer: No Typology Code available for payment source | Admitting: Family Medicine

## 2016-10-07 ENCOUNTER — Telehealth: Payer: Self-pay

## 2016-10-07 NOTE — Telephone Encounter (Signed)
Called and spoke with patient. Advised how the referral process with [facility] works and if there is an opening for that speciality that patient will be called. Patient verbalized understanding. Thanks!

## 2016-10-10 ENCOUNTER — Encounter: Payer: Self-pay | Admitting: Cardiology

## 2016-10-10 ENCOUNTER — Ambulatory Visit (INDEPENDENT_AMBULATORY_CARE_PROVIDER_SITE_OTHER): Payer: No Typology Code available for payment source | Admitting: Cardiology

## 2016-10-10 ENCOUNTER — Encounter (INDEPENDENT_AMBULATORY_CARE_PROVIDER_SITE_OTHER): Payer: Self-pay

## 2016-10-10 VITALS — BP 130/86 | HR 107 | Ht 73.0 in | Wt 233.8 lb

## 2016-10-10 DIAGNOSIS — I472 Ventricular tachycardia: Secondary | ICD-10-CM

## 2016-10-10 DIAGNOSIS — I119 Hypertensive heart disease without heart failure: Secondary | ICD-10-CM

## 2016-10-10 DIAGNOSIS — I493 Ventricular premature depolarization: Secondary | ICD-10-CM

## 2016-10-10 DIAGNOSIS — I43 Cardiomyopathy in diseases classified elsewhere: Secondary | ICD-10-CM

## 2016-10-10 DIAGNOSIS — I5042 Chronic combined systolic (congestive) and diastolic (congestive) heart failure: Secondary | ICD-10-CM

## 2016-10-10 DIAGNOSIS — I340 Nonrheumatic mitral (valve) insufficiency: Secondary | ICD-10-CM

## 2016-10-10 DIAGNOSIS — I4729 Other ventricular tachycardia: Secondary | ICD-10-CM

## 2016-10-10 DIAGNOSIS — I10 Essential (primary) hypertension: Secondary | ICD-10-CM

## 2016-10-10 MED ORDER — LABETALOL HCL 200 MG PO TABS: 200.0000 mg | ORAL_TABLET | Freq: Two times a day (BID) | ORAL | 3 refills | Status: DC

## 2016-10-10 MED ORDER — CLONIDINE HCL 0.2 MG PO TABS: 0.2000 mg | ORAL_TABLET | Freq: Every day | ORAL | 3 refills | Status: DC

## 2016-10-10 MED FILL — LABETALOL HCL 200 MG TABLET: 200 | 30 days supply | Qty: 60 | Fill #0

## 2016-10-10 MED FILL — cloNIDine HCL 0.2 MG TABS: 0.2 | 30 days supply | Qty: 30 | Fill #0

## 2016-10-10 NOTE — Patient Instructions (Signed)
Medication Instructions:  1) CHANGE LABETALOL to 200 mg TWICE DAILY  Labwork: TODAY: BMET, mag, BNP  Testing/Procedures: Your physician has requested that you have an echocardiogram. Echocardiography is a painless test that uses sound waves to create images of your heart. It provides your doctor with information about the size and shape of your heart and how well your heart's chambers and valves are working. This procedure takes approximately one hour. There are no restrictions for this procedure.  Follow-Up: Your physician wants you to follow-up in: 6 months with Dr. [name]. You will receive a reminder letter in the mail two months in advance. If you don't receive a letter, please call our office to schedule the follow-up appointment.   Any Other Special Instructions Will Be Listed Below (If Applicable).     If you need a refill on your cardiac medications before your next appointment, please call your pharmacy.

## 2016-10-10 NOTE — Progress Notes (Signed)
Cardiology Office Note    Date:  10/10/2016   ID:  Daniel Joseph, DOB September 16, 1961, MRN [id number]  PCP:  Dorena Dew, FNP  Cardiologist:  Fransico Him, MD   Chief Complaint  Patient presents with  . Cardiomyopathy  . Hypertension    History of Present Illness:  Daniel Joseph is a 56 y.o. 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  EF 35-40% and grade 2 diastolic dysfunction on echo.  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    Event monitor in 3/16 demonstrated sinus rhythm, sinus tachycardia, PVCs and a run of wide-complex tachycardia. Holter monitor was arranged as well as Lexiscan Myoview. Nuclear study was done and demonstrated defect in the inferoapical region that was felt to represent possible scar but echo showed no distinct wall motion abnormality just diffuse HK, no ischemia on nuclear study and  EF 30-44%. Patient was evaluated by Dr. Lovena Le 01/26/15 for nonsustained ventricular tachycardia. ICD or EP study was not recommended.  Repeat echo 08/2015 with EF 45-50%.  He returns for FU. He is overall doing ok.   He denies any chest pain or pressure and no SOB or DOE during the day.  He denies any orthopnea but during the night he feels the PVCs and then gets anxious and SOB and has to sit up for an hour.  He had some edema in his left ankle yesterday. He denies syncope or dizziness.  He feels his PVCs which are somewhat annoying at times, especially at night.     Past Medical History:  Diagnosis Date  . Benign essential HTN 01/19/2014   Renal Artery [place] 6/16:  No RAS, No AAA  . Cardiomyopathy, dilated, nonischemic (HCC)    EF 40-45% by echo 08/2015  . Chronic combined systolic and diastolic CHF (congestive heart failure) ([place]) 01/21/2014  . Gout   . Mitral valve regurgitation    MVP with mild MR by echo 08/2015    . PVC's (premature ventricular contractions)    nonsustained VT as well as PVCs - no ICD indicated due to EF 40-45%  . 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 ABDOMINAL SURGERY  at birth  . TEE WITHOUT CARDIOVERSION N/A 01/25/2014   Procedure: TRANSESOPHAGEAL ECHOCARDIOGRAM (TEE);  Surgeon: [name], MD;  Location: [facility];  Service: Cardiovascular;  Laterality: N/A;  . TEE WITHOUT CARDIOVERSION N/A 09/14/2014   Procedure: TRANSESOPHAGEAL ECHOCARDIOGRAM (TEE);  Surgeon: Sueanne Margarita, MD;  Location: [facility] ENDOSCOPY;  Service: Cardiovascular;  Laterality: N/A;    Current Medications: Outpatient Medications Prior to Visit  Medication Sig Dispense Refill  . amLODipine (NORVASC) 10 MG tablet Take 1 tablet (10 mg total) by mouth daily. 30 tablet 2  . aspirin EC 81 MG tablet Take 1 tablet (81 mg total) by mouth daily. 90 tablet 3  . benazepril (LOTENSIN) 40 MG tablet Take 1 tablet (40 mg total) by mouth daily. 30 tablet 2  . cetirizine (ZYRTEC) 10 MG tablet TAKE ONE TABLET BY MOUTH ONCE DAILY 30 tablet 0  . cloNIDine (CATAPRES) 0.2 MG tablet Take 0.2 mg by mouth daily.   3  . cyclobenzaprine (FLEXERIL) 5 MG tablet Take 1 tablet (5 mg total) by mouth 3 (three) times daily as needed for muscle spasms. 30 tablet 0  .  doxazosin (CARDURA) 4 MG tablet Take 1.5 tablets (6 mg total) by mouth at bedtime. 45 tablet 2  . fluticasone (FLONASE) 50 MCG/ACT nasal spray Place 2 sprays into both nostrils daily. 16 g 6  . furosemide (LASIX) 40 MG tablet Take 1 tablet (40 mg total) by mouth daily. 30 tablet 2  . hydrALAZINE (APRESOLINE) 50 MG tablet TAKE 2 TABLETS BY MOUTH 3 TIMES DAILY 180 tablet 2  . isosorbide mononitrate (IMDUR) 30 MG 24 hr tablet Take 1 tablet (30 mg total) by mouth daily. 30 tablet 2  . labetalol (NORMODYNE) 300 MG tablet Take 300 mg by mouth daily. Reported on 03/06/2016    . naproxen (NAPROSYN) 500 MG tablet  Take 1 tablet (500 mg total) by mouth 2 (two) times daily. 30 tablet 0  . oxyCODONE-acetaminophen (PERCOCET/ROXICET) 5-325 MG tablet Take 1 tablet by mouth every 4 (four) hours as needed for severe pain. 15 tablet 0  . spironolactone (ALDACTONE) 25 MG tablet Take 0.5 tablets (12.5 mg total) by mouth daily. 15 tablet 2  . traMADol (ULTRAM) 50 MG tablet Take 1 tablet (50 mg total) by mouth every 6 (six) hours as needed. 30 tablet 0   No facility-administered medications prior to visit.      Allergies:   Patient has no known allergies.   Social History   Social History  . Marital status: Single    Spouse name: N/A  . Number of children: N/A  . Years of education: N/A   Social History Main Topics  . Smoking status: Never Smoker  . Smokeless tobacco: Never Used  . Alcohol use No     Comment: social  . Drug use: No  . Sexual activity: Not Asked   Other Topics Concern  . None   Social History Narrative  . None     Family History:  The patient's family history includes Heart Problems in his brother, father, and mother; Heart attack in his brother and father; Heart failure in his mother; Hypertension in his brother, father, and mother; Stroke in his mother.   ROS:   Please see the history of present illness.    ROS All other systems reviewed and are negative.  No flowsheet data found.     PHYSICAL EXAM:   VS:  BP 130/86   Pulse (!) 107   Ht 6\' 1"  (1.854 m)   Wt 233 lb 12.8 oz (106.1 kg)   SpO2 97%   BMI 30.85 kg/m    GEN: Well nourished, well developed, in no acute distress  HEENT: normal  Neck: no JVD, carotid bruits, or masses Cardiac: RRR; no murmurs, rubs, or gallops,no edema.  Intact distal pulses bilaterally.  Respiratory:  clear to auscultation bilaterally, normal work of breathing GI: soft, nontender, nondistended, + BS MS: no deformity or atrophy  Skin: warm and dry, no rash Neuro:  Alert and Oriented x 3, Strength and sensation are intact Psych: euthymic  mood, full affect  Wt Readings from Last 3 Encounters:  10/10/16 233 lb 12.8 oz (106.1 kg)  09/19/16 230 lb (104.3 kg)  07/01/16 235 lb (106.6 kg)      Studies/Labs Reviewed:   EKG:  EKG is ordered today.  The ekg ordered today demonstrates NSR with frequent PVCs and ventricular couplets and lateral T wave abnormality  Recent Labs: 07/01/2016: ALT 20; BUN 13; Creat 1.11; Hemoglobin 14.9; Platelets 174; Potassium 3.9; Sodium 139   Lipid Panel    Component Value Date/Time   CHOL 175 07/01/2016  1359   TRIG 281 (H) 07/01/2016 1359   HDL 37 (L) 07/01/2016 1359   CHOLHDL 4.7 07/01/2016 1359   VLDL 56 (H) 07/01/2016 1359   LDLCALC 82 07/01/2016 1359    Additional studies/ records that were reviewed today include:  none    ASSESSMENT:    1. Chronic combined systolic and diastolic CHF (congestive heart failure) ([place])   2. Cardiomyopathy due to hypertension, without heart failure ([place])   3. Nonsustained paroxysmal ventricular tachycardia ([place])   4. Essential hypertension   5. Mitral valve insufficiency, unspecified etiology   6. PVC's (premature ventricular contractions)      PLAN:  In order of problems listed above:  1. Chronic combined systolic and diastolic CHF secondary to HTN.  He appears euvolemic on exam and weight is stable. Continue diuretic/BB/Imdur/ACE I/diuretic and Hydralazine.  I will check a BNP since he has had some SOB at night but I suspect that is from the PVCs 2. Hypertensive DCM - EF 45-50% by echo 08/2015.  Continue BB and ACE I.  3. Nonsustained VT evaluated by EP - no ICD given EF > 35%. 4. HTN - BP controlled on current meds.  He will continue on amlodipine/ACE I/Clonidine/BB/Hydralazine/aldactone and doxazosin 5. MVP with mild MR by echo 08/2015. 6. PVC's - these are somewhat symptomatic especially at night so I will check a BMET and Mag and change Labetolol to 200mg  BID instead of 300mg  daily as this is probably wearing off in the evening and he is  having more PVCs that are symptomatic at night.  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    Medication Adjustments/Labs and Tests Ordered: Current medicines are reviewed at length with the patient today.  Concerns regarding medicines are outlined above.  Medication changes, Labs and Tests ordered today are listed in the Patient Instructions below.  There are no Patient Instructions on file for this visit.   Signed, Fransico Him, MD  10/10/2016 9:05 AM    [address], [place], [place]  [address] Phone: [phone]; Fax: [phone]

## 2016-10-11 LAB — BASIC METABOLIC PANEL
BUN/Creatinine Ratio: 10 (ref 9–20)
BUN: 15 mg/dL (ref 6–24)
CO2: 23 mmol/L (ref 18–29)
CREATININE: 1.46 mg/dL — AB (ref 0.76–1.27)
Calcium: 9.2 mg/dL (ref 8.7–10.2)
Chloride: 99 mmol/L (ref 96–106)
GFR calc Af Amer: 63 mL/min/{1.73_m2} (ref >59)
GFR, EST NON AFRICAN AMERICAN: 54 mL/min/{1.73_m2} — AB (ref >59)
Glucose: 92 mg/dL (ref 65–99)
POTASSIUM: 4 mmol/L (ref 3.5–5.2)
SODIUM: 139 mmol/L (ref 134–144)

## 2016-10-11 LAB — PRO B NATRIURETIC PEPTIDE: NT-PRO BNP: 360 pg/mL — AB (ref 0–121)

## 2016-10-11 LAB — MAGNESIUM: Magnesium: 2 mg/dL (ref 1.6–2.3)

## 2016-10-21 ENCOUNTER — Ambulatory Visit: Payer: No Typology Code available for payment source | Admitting: Family Medicine

## 2016-10-28 ENCOUNTER — Encounter: Payer: Self-pay | Admitting: Cardiology

## 2016-10-28 ENCOUNTER — Ambulatory Visit (HOSPITAL_COMMUNITY): Payer: No Typology Code available for payment source | Attending: Internal Medicine

## 2016-10-28 ENCOUNTER — Telehealth: Payer: Self-pay

## 2016-10-28 DIAGNOSIS — I119 Hypertensive heart disease without heart failure: Secondary | ICD-10-CM | POA: Insufficient documentation

## 2016-10-28 DIAGNOSIS — I43 Cardiomyopathy in diseases classified elsewhere: Secondary | ICD-10-CM | POA: Insufficient documentation

## 2016-10-28 DIAGNOSIS — I340 Nonrheumatic mitral (valve) insufficiency: Secondary | ICD-10-CM

## 2016-10-28 NOTE — Telephone Encounter (Signed)
Informed patient of results and verbal understanding expressed.  Repeat ECHO ordered to be scheduled in 1 year. Patient agrees with treatment plan. 

## 2016-10-28 NOTE — Telephone Encounter (Signed)
-----   Message from Sueanne Margarita, MD sent at 10/28/2016 12:18 PM EST ----- Echo showed  Mildly reduced LVF with EF 45% with inferior HK/AK and mild LVH, moderate MR eccentri, severely dilated LA, mildly reduced RVF, nodular density on atrial surface of anterior tricuspid leaflet c/w fibroelastoma - compared to prior study the fibroelastoma is stable and MR is slightly more prominent.  Repeat echo in 1 year for MR

## 2016-11-13 ENCOUNTER — Other Ambulatory Visit: Payer: Self-pay | Admitting: Family Medicine

## 2016-11-18 ENCOUNTER — Encounter: Payer: Self-pay | Admitting: Family Medicine

## 2016-11-18 ENCOUNTER — Ambulatory Visit (INDEPENDENT_AMBULATORY_CARE_PROVIDER_SITE_OTHER): Payer: No Typology Code available for payment source | Admitting: Family Medicine

## 2016-11-18 VITALS — BP 166/110 | HR 65 | Temp 97.2°F | Resp 14 | Ht 73.0 in | Wt 238.0 lb

## 2016-11-18 DIAGNOSIS — I43 Cardiomyopathy in diseases classified elsewhere: Secondary | ICD-10-CM

## 2016-11-18 DIAGNOSIS — I119 Hypertensive heart disease without heart failure: Secondary | ICD-10-CM

## 2016-11-18 DIAGNOSIS — M5441 Lumbago with sciatica, right side: Secondary | ICD-10-CM

## 2016-11-18 DIAGNOSIS — M5442 Lumbago with sciatica, left side: Secondary | ICD-10-CM

## 2016-11-18 DIAGNOSIS — J309 Allergic rhinitis, unspecified: Secondary | ICD-10-CM

## 2016-11-18 DIAGNOSIS — G8929 Other chronic pain: Secondary | ICD-10-CM

## 2016-11-18 LAB — POCT URINALYSIS DIP (DEVICE)
BILIRUBIN URINE: NEGATIVE
Glucose, UA: NEGATIVE mg/dL
Hgb urine dipstick: NEGATIVE
Ketones, ur: NEGATIVE mg/dL
LEUKOCYTES UA: NEGATIVE
NITRITE: NEGATIVE
PH: 5.5 (ref 5.0–8.0)
Protein, ur: NEGATIVE mg/dL
Specific Gravity, Urine: 1.015 (ref 1.005–1.030)
Urobilinogen, UA: 0.2 mg/dL (ref 0.0–1.0)

## 2016-11-18 LAB — BASIC METABOLIC PANEL
BUN: 15 mg/dL (ref 7–25)
CALCIUM: 9.1 mg/dL (ref 8.6–10.3)
CO2: 28 mmol/L (ref 20–31)
CREATININE: 1.32 mg/dL (ref 0.70–1.33)
Chloride: 104 mmol/L (ref 98–110)
Glucose, Bld: 85 mg/dL (ref 65–99)
POTASSIUM: 3.8 mmol/L (ref 3.5–5.3)
Sodium: 140 mmol/L (ref 135–146)

## 2016-11-18 MED ORDER — MELOXICAM 7.5 MG PO TABS: 7.5000 mg | ORAL_TABLET | Freq: Every day | ORAL | 0 refills | Status: DC

## 2016-11-18 MED ORDER — CETIRIZINE HCL 10 MG PO TABS: 10.0000 mg | ORAL_TABLET | Freq: Every day | ORAL | 11 refills | Status: DC

## 2016-11-18 MED ORDER — GABAPENTIN 300 MG PO CAPS: 300.0000 mg | ORAL_CAPSULE | Freq: Three times a day (TID) | ORAL | 3 refills | Status: DC

## 2016-11-18 MED FILL — ?CETIRIZINE HCL 10 MG TABLE: 10 | 30 days supply | Qty: 30 | Fill #0

## 2016-11-18 MED FILL — MELOXICAM 7.5 MG TABLET: 7.5 | 30 days supply | Qty: 30 | Fill #0

## 2016-11-18 MED FILL — FLUTICASONE PROP 50 MCG SPR: 50 | 30 days supply | Qty: 16 | Fill #0

## 2016-11-18 MED FILL — GABAPENTIN 300 MG CAPSULE: 300 | 30 days supply | Qty: 90 | Fill #0

## 2016-11-18 NOTE — Patient Instructions (Addendum)
Will follow up by phone in 1 month.  Refrain from lifting items heavier than 25 pounds Apply warm, moist compresses to lower back

## 2016-11-18 NOTE — Progress Notes (Signed)
Subjective:    Patient ID: Daniel Joseph, male    DOB: July 15, 1961, 56 y.o.   MRN: [id number] [name], a 56 year old male with a history of chronic back pain, hypertensive heart disease, and uncontrolled hypertension presents complaining of worsening low back pain. Patient had an xray of lower back next month, which shows degenerative disc disease. He states that current pain intensity is 10/10 described as constant and throbbing. Pain is worsened by prolonged sitting. Pertinent negatives include no bladder incontinence, chest pain, dysuria, headaches, or leg pain. He endorses numbness to lower extremities.  Hypertension  This is a chronic (Blood pressure markedly elevated upon arrival. Patient is currently having 10/10 back pain) problem. The current episode started more than 1 year ago. The problem has been rapidly worsening since onset. The problem is uncontrolled. Pertinent negatives include no anxiety, chest pain, headaches, orthopnea, palpitations, peripheral edema, PND, shortness of breath or sweats.   Past Medical History:  Diagnosis Date  . Benign essential HTN 01/19/2014   Renal Artery [place] 6/16:  No RAS, No AAA  . Cardiomyopathy, dilated, nonischemic (HCC)    EF 45-50% bye echo 08/2015  . Chronic combined systolic and diastolic CHF (congestive heart failure) ([place]) 01/21/2014  . Gout   . Mitral valve regurgitation    Moderate MR by echo 2018  . PVC's (premature ventricular contractions)    nonsustained VT as well as PVCs - no ICD indicated due to EF 40-45%  . 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 Marital status: Single    Spouse name: N/A  . Number of children: N/A  . Years of education: N/A   Occupational History  . Not on file.   Social History Main Topics  . Smoking status: Never Smoker  . Smokeless tobacco: Never Used  . Alcohol use No     Comment: social  . Drug use: No  . Sexual activity: Not on file    Other Topics Concern  . Not on file   Social History Narrative  . No narrative on file   Review of Systems  Constitutional: Negative.  Negative for fatigue and unexpected weight change.  HENT: Negative.   Eyes: Positive for redness. Negative for photophobia.  Respiratory: Negative for shortness of breath.   Cardiovascular: Negative.  Negative for chest pain, palpitations, orthopnea, leg swelling and PND.  Gastrointestinal: Negative.  Negative for bowel incontinence.  Endocrine: Negative for cold intolerance, heat intolerance, polydipsia, polyphagia and polyuria.  Genitourinary: Negative.  Negative for bladder incontinence, decreased urine volume, difficulty urinating, dysuria, hematuria and urgency.  Musculoskeletal: Positive for back pain.  Skin: Negative.   Allergic/Immunologic: Positive for environmental allergies. Negative for immunocompromised state.  Neurological: Negative for numbness, headaches and paresthesias.  Hematological: Negative.   Psychiatric/Behavioral: Negative.        Objective:   Physical Exam  Constitutional: He is oriented to person, place, and time. He appears well-developed and well-nourished.  HENT:  Head: Normocephalic and atraumatic.  Right Ear: External ear normal.  Left Ear: External ear normal.  Nose: Nose normal.  Mouth/Throat: Oropharynx is clear and moist.  Eyes: Conjunctivae and EOM are normal. Pupils are equal, round, and reactive to light.  Neck: Normal range of motion. Neck supple.  Cardiovascular: Normal rate, regular rhythm, normal heart sounds and intact distal pulses.   Pulmonary/Chest: Effort normal and breath sounds normal.  Abdominal: Soft. Bowel sounds are normal.  Musculoskeletal:       Lumbar back: He exhibits decreased range of motion, tenderness, pain and spasm. He exhibits no swelling and no edema.  Neurological: He is alert and oriented to person, place, and time. He has normal reflexes.  Skin: Skin is warm and dry.   Psychiatric: He has a normal mood and affect. His behavior is normal. Judgment and thought content normal.      BP (!) 173/118 (BP Location: Right Arm, Patient Position: Sitting, Cuff Size: Large)   Pulse 65   Temp 97.2 F (36.2 C) (Oral)   Resp 14   Ht 6\' 1"  (1.854 m)   Wt 238 lb (108 kg)   SpO2 100%   BMI 31.40 kg/m  Assessment & Plan:  1. Chronic bilateral low back pain with bilateral sciatica Recommend that patient apply ice 20 minutes 4 times per day as needed. Also, given written information on heat therapy to use intermittently. - gabapentin (NEURONTIN) 300 MG capsule; Take 1 capsule (300 mg total) by mouth 3 (three) times daily.  Dispense: 90 capsule; Refill: 3 - meloxicam (MOBIC) 7.5 MG tablet; Take 1 tablet (7.5 mg total) by mouth daily.  Dispense: 30 tablet; Refill: 0  2. Chronic allergic rhinitis, unspecified seasonality, unspecified trigger - cetirizine (ZYRTEC) 10 MG tablet; Take 1 tablet (10 mg total) by mouth daily.  Dispense: 30 tablet; Refill: 11  3. Cardiomyopathy due to hypertension, without heart failure ([place]) Follow up with Dr. Fransico Joseph as scheduled for accelerated hypertension. He says that he is taking antihypertensive medications consistently.  - Basic Metabolic Panel  RTC: 3 months for chronic back pain  Daniel Joseph M, FNP

## 2016-12-10 ENCOUNTER — Ambulatory Visit (HOSPITAL_COMMUNITY)
Admission: EM | Admit: 2016-12-10 | Discharge: 2016-12-10 | Disposition: A | Discharge status: Home / Self Care | Payer: No Typology Code available for payment source | Attending: Internal Medicine | Admitting: Internal Medicine

## 2016-12-10 ENCOUNTER — Encounter (HOSPITAL_COMMUNITY): Payer: Self-pay | Admitting: *Deleted

## 2016-12-10 DIAGNOSIS — G8929 Other chronic pain: Secondary | ICD-10-CM

## 2016-12-10 DIAGNOSIS — M5442 Lumbago with sciatica, left side: Secondary | ICD-10-CM

## 2016-12-10 DIAGNOSIS — M5441 Lumbago with sciatica, right side: Secondary | ICD-10-CM

## 2016-12-10 MED ORDER — PREDNISONE 50 MG PO TABS: ORAL_TABLET | ORAL | 0 refills | Status: DC

## 2016-12-10 MED ORDER — METHOCARBAMOL 500 MG PO TABS
500.0000 mg | ORAL_TABLET | Freq: Two times a day (BID) | ORAL | 0 refills | Status: DC
Start: 2016-12-10 — End: 2016-12-31

## 2016-12-10 NOTE — ED Triage Notes (Signed)
Back  Pain   X   1  Month   Denies   Any  Injury  Symptoms  Not  releived  By  meds   given  By  pcp  Also  Has  A  Possible   Insect  Bite  To  r  Lower leg  As   Well

## 2016-12-10 NOTE — ED Provider Notes (Signed)
CSN: [id number]     Arrival date & time 12/10/16  1236 History   First MD Initiated Contact with Patient 12/10/16 1342     Chief Complaint  Patient presents with  . Back Pain   (Consider location/radiation/quality/duration/timing/severity/associated sxs/prior Treatment) 56 year old male with chronic back pain states she is had back pain for only one month. He saw his PCP proximal 0.3 weeks ago and is treated with gabapentin and meloxicam. Was advised to use ice and then heat and stretches. He apparently has not been doing that. Pain is located across the sacrum and parasacral musculature. Pain is worse with bending and other movements. Denies focal paresthesias or weakness but does have pain that radiates down the back of both legs. This is a similar history in which she presented with his PCP 3 weeks ago. 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      Past Medical History:  Diagnosis Date  . Benign essential HTN 01/19/2014   Renal Artery [place] 6/16:  No RAS, No AAA  . Cardiomyopathy, dilated, nonischemic (HCC)    EF 45-50% bye echo 08/2015  . Chronic combined systolic and diastolic CHF (congestive heart failure) ([place]) 01/21/2014  . Gout   . Mitral valve regurgitation    Moderate MR by echo 2018  . PVC's (premature ventricular contractions)    nonsustained VT as well as PVCs - no ICD indicated due to EF 40-45%  . 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 ABDOMINAL SURGERY  at birth  . TEE WITHOUT CARDIOVERSION N/A 01/25/2014   Procedure: TRANSESOPHAGEAL ECHOCARDIOGRAM (TEE);  Surgeon: [name], MD;  Location: [facility];  Service: Cardiovascular;  Laterality: N/A;  . TEE WITHOUT CARDIOVERSION N/A 09/14/2014   Procedure: TRANSESOPHAGEAL ECHOCARDIOGRAM (TEE);  Surgeon: Sueanne Margarita, MD;  Location: [facility] ENDOSCOPY;  Service: Cardiovascular;  Laterality: N/A;   Family History   Problem Relation Age of Onset  . Heart Problems Mother   . Stroke Mother   . Heart failure Mother   . Hypertension Mother   . Heart Problems Father   . Heart attack Father   . Hypertension Father   . Heart Problems Brother   . Heart attack Brother   . Hypertension Brother    Social History  Substance Use Topics  . Smoking status: Never Smoker  . Smokeless tobacco: Never Used  . Alcohol use No     Comment: social    Review of Systems  Constitutional: Negative.   Respiratory: Negative.   Gastrointestinal: Negative.   Genitourinary: Negative.   Musculoskeletal: Positive for back pain and myalgias.       As per HPI  Skin: Negative.   Neurological: Negative for dizziness, weakness, numbness and headaches.  All other systems reviewed and are negative.   Allergies  Patient has no known allergies.  Home Medications   Prior to Admission medications   Medication Sig Start Date End Date Taking? Authorizing Provider  amLODipine (NORVASC) 10 MG tablet Take 1 tablet (10 mg total) by mouth daily. 08/13/16   Sueanne Margarita, MD  aspirin EC 81 MG tablet Take 1 tablet (81 mg total) by mouth daily. 04/01/14   Deboraha Sprang, MD  benazepril (LOTENSIN) 40 MG tablet Take 1 tablet (40 mg total) by mouth daily. 08/13/16   Sueanne Margarita, MD  cetirizine (ZYRTEC) 10 MG tablet Take 1 tablet (  10 mg total) by mouth daily. 11/18/16   Dorena Dew, FNP  cloNIDine (CATAPRES) 0.2 MG tablet Take 1 tablet (0.2 mg total) by mouth daily. 10/10/16   Sueanne Margarita, MD  cyclobenzaprine (FLEXERIL) 5 MG tablet Take 1 tablet (5 mg total) by mouth 3 (three) times daily as needed for muscle spasms. 09/19/16   Dorena Dew, FNP  doxazosin (CARDURA) 4 MG tablet Take 1.5 tablets (6 mg total) by mouth at bedtime. 08/13/16   Sueanne Margarita, MD  fluticasone (FLONASE) 50 MCG/ACT nasal spray Place 2 sprays into both nostrils daily. 06/01/15   Micheline Chapman, NP  fluticasone (FLONASE) 50 MCG/ACT nasal spray PLACE 2  SPRAYS INTO BOTH NOSTRILS DAILY. 11/13/16   Micheline Chapman, NP  furosemide (LASIX) 40 MG tablet Take 1 tablet (40 mg total) by mouth daily. 08/13/16   Sueanne Margarita, MD  gabapentin (NEURONTIN) 300 MG capsule Take 1 capsule (300 mg total) by mouth 3 (three) times daily. 11/18/16   Dorena Dew, FNP  hydrALAZINE (APRESOLINE) 50 MG tablet TAKE 2 TABLETS BY MOUTH 3 TIMES DAILY 08/13/16   Sueanne Margarita, MD  isosorbide mononitrate (IMDUR) 30 MG 24 hr tablet Take 1 tablet (30 mg total) by mouth daily. 08/13/16   Sueanne Margarita, MD  labetalol (NORMODYNE) 200 MG tablet Take 1 tablet (200 mg total) by mouth 2 (two) times daily. 10/10/16 10/05/17  Sueanne Margarita, MD  meloxicam (MOBIC) 7.5 MG tablet Take 1 tablet (7.5 mg total) by mouth daily. 11/18/16   Dorena Dew, FNP  spironolactone (ALDACTONE) 25 MG tablet Take 0.5 tablets (12.5 mg total) by mouth daily. 08/13/16   Sueanne Margarita, MD  traMADol (ULTRAM) 50 MG tablet Take 1 tablet (50 mg total) by mouth every 6 (six) hours as needed. 09/19/16   Dorena Dew, FNP   Meds Ordered and Administered this Visit  Medications - No data to display  BP (!) 164/108 (BP Location: Right Arm)   Pulse 78   Temp 98.1 F (36.7 C) (Oral)   Resp 18   SpO2 98%  No data found.   Physical Exam  Constitutional: He is oriented to person, place, and time. He appears well-developed and well-nourished.  HENT:  Head: Normocephalic and atraumatic.  Eyes: EOM are normal. Left eye exhibits no discharge.  Neck: Normal range of motion.  Cardiovascular: Normal rate.   Pulmonary/Chest: Effort normal.  Musculoskeletal: He exhibits no edema.  Tenderness over the lower lumbosacral spine as well as the parasacral musculature. Most of the pain and tenderness are over the musculature. Distal lower extremity strength 4/5. Bilaterally. moves all extremities.  Neurological: He is alert and oriented to person, place, and time. No cranial nerve deficit.  Skin: Skin is warm and  dry.  Psychiatric: He has a normal mood and affect.  Nursing note and vitals reviewed.   Urgent Care Course     Procedures (including critical care time)  Labs Review Labs Reviewed - No data to display  Imaging Review No results found.   Visual Acuity Review  Right Eye Distance:   Left Eye Distance:   Bilateral Distance:    Right Eye Near:   Left Eye Near:    Bilateral Near:         MDM   1. Chronic bilateral low back pain with bilateral sciatica    The medicines may help relieve some of the pain but will not actually treat the back problem. He  will need to apply heat to the muscles, perform stretches and may likely have to have physical therapy. Call your PCP for follow-up appointment. In the meantime stop taking the meloxicam and start the short course of prednisone, after the prednisone you may restart the meloxicam. Also you are being prescribed a muscle relaxant. This may cause some drowsiness. Meds ordered this encounter  Medications  . predniSONE (DELTASONE) 50 MG tablet    Sig: 1 tab po daily for 6 days. Take with food.    Dispense:  6 tablet    Refill:  0    Order Specific Question:   Supervising Provider    Answer:   Sherlene Shams [[id number]]  . methocarbamol (ROBAXIN) 500 MG tablet    Sig: Take 1 tablet (500 mg total) by mouth 2 (two) times daily. Prn muscle spasm.    Dispense:  15 tablet    Refill:  0    Order Specific Question:   Supervising Provider    Answer:   Sherlene Shams [[id number]]       Janne Napoleon, NP 12/10/16 1408

## 2016-12-10 NOTE — Discharge Instructions (Signed)
The medicines may help relieve some of the pain but will not actually treat the back problem. He will need to apply heat to the muscles, perform stretches and may likely have to have physical therapy. Call your PCP for follow-up appointment. In the meantime stop taking the meloxicam and start the short course of prednisone, after the prednisone you may restart the meloxicam. Also you are being prescribed a muscle relaxant. This may cause some drowsiness.

## 2016-12-16 MED FILL — FUROSEMIDE 40 MG TABLET: 40 | 30 days supply | Qty: 30 | Fill #1

## 2016-12-16 MED FILL — BENAZEPRIL HCL 40 MG TABLET: 40 | 30 days supply | Qty: 30 | Fill #1

## 2016-12-20 MED FILL — ?PREDNISONE 10 MG TABLET: 10 | 6 days supply | Qty: 30 | Fill #0

## 2016-12-20 MED FILL — METHOCARBAMOL 500 MG TABLET: 500 | 7 days supply | Qty: 15 | Fill #0

## 2016-12-23 ENCOUNTER — Ambulatory Visit (HOSPITAL_COMMUNITY)
Admission: EM | Admit: 2016-12-23 | Discharge: 2016-12-23 | Disposition: A | Discharge status: Home / Self Care | Payer: No Typology Code available for payment source | Attending: Internal Medicine | Admitting: Internal Medicine

## 2016-12-23 ENCOUNTER — Encounter (HOSPITAL_COMMUNITY): Payer: Self-pay | Admitting: *Deleted

## 2016-12-23 DIAGNOSIS — L03115 Cellulitis of right lower limb: Secondary | ICD-10-CM

## 2016-12-23 DIAGNOSIS — T63301A Toxic effect of unspecified spider venom, accidental (unintentional), initial encounter: Secondary | ICD-10-CM

## 2016-12-23 MED ORDER — DOXYCYCLINE HYCLATE 100 MG PO CAPS: 100.0000 mg | ORAL_CAPSULE | Freq: Two times a day (BID) | ORAL | 0 refills | Status: DC

## 2016-12-23 MED FILL — ?DOXYCYCLINE HYCLATE 100 MG: 100 | 10 days supply | Qty: 20 | Fill #0

## 2016-12-23 NOTE — ED Triage Notes (Signed)
Pt  States  6  Days  Ago   Pain  And  Swelling  r  Lower   Leg   Denies   Any  Injury  Poss    Insect  Bite  Or  abcess

## 2016-12-23 NOTE — ED Provider Notes (Signed)
CSN: [id number]     Arrival date & time 12/23/16  1200 History   None    Chief Complaint  Patient presents with  . Leg Swelling   (Consider location/radiation/quality/duration/timing/severity/associated sxs/prior Treatment) Patient states he was bitten by a spider on his right LE or right leg/   The history is provided by the patient.  Leg Pain  Location:  Leg Time since incident:  6 days Injury: no   Leg location:  R leg Pain details:    Quality:  Aching   Radiates to:  Does not radiate   Severity:  Moderate   Onset quality:  Sudden   Duration:  6 days   Timing:  Constant   Progression:  Worsening Chronicity:  New Dislocation: no   Foreign body present:  Unable to specify Tetanus status:  Up to date Prior injury to area:  No Relieved by:  Nothing Worsened by:  Nothing Ineffective treatments:  None tried   Past Medical History:  Diagnosis Date  . Benign essential HTN 01/19/2014   Renal Artery [place] 6/16:  No RAS, No AAA  . Cardiomyopathy, dilated, nonischemic (HCC)    EF 45-50% bye echo 08/2015  . Chronic combined systolic and diastolic CHF (congestive heart failure) ([place]) 01/21/2014  . Gout   . Mitral valve regurgitation    Moderate MR by echo 2018  . PVC's (premature ventricular contractions)    nonsustained VT as well as PVCs - no ICD indicated due to EF 40-45%  . 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 ABDOMINAL SURGERY  at birth  . TEE WITHOUT CARDIOVERSION N/A 01/25/2014   Procedure: TRANSESOPHAGEAL ECHOCARDIOGRAM (TEE);  Surgeon: [name], MD;  Location: [facility];  Service: Cardiovascular;  Laterality: N/A;  . TEE WITHOUT CARDIOVERSION N/A 09/14/2014   Procedure: TRANSESOPHAGEAL ECHOCARDIOGRAM (TEE);  Surgeon: Sueanne Margarita, MD;  Location: [facility] ENDOSCOPY;  Service: Cardiovascular;  Laterality: N/A;   Family History  Problem Relation Age of Onset  . Heart Problems Mother   . Stroke  Mother   . Heart failure Mother   . Hypertension Mother   . Heart Problems Father   . Heart attack Father   . Hypertension Father   . Heart Problems Brother   . Heart attack Brother   . Hypertension Brother    Social History  Substance Use Topics  . Smoking status: Never Smoker  . Smokeless tobacco: Never Used  . Alcohol use No     Comment: social    Review of Systems  Constitutional: Negative.   HENT: Negative.   Eyes: Negative.   Respiratory: Negative.   Cardiovascular: Negative.   Gastrointestinal: Negative.   Endocrine: Negative.   Genitourinary: Negative.   Musculoskeletal: Negative.   Skin: Positive for wound.  Allergic/Immunologic: Negative.   Neurological: Negative.   Hematological: Negative.   Psychiatric/Behavioral: Negative.     Allergies  Patient has no known allergies.  Home Medications   Prior to Admission medications   Medication Sig Start Date End Date Taking? Authorizing Provider  amLODipine (NORVASC) 10 MG tablet Take 1 tablet (10 mg total) by mouth daily. 08/13/16   Sueanne Margarita, MD  aspirin EC 81 MG tablet Take 1 tablet (81 mg total) by mouth daily. 04/01/14   Deboraha Sprang, MD  benazepril (LOTENSIN) 40 MG tablet Take 1 tablet (40 mg total) by mouth daily. 08/13/16   Sueanne Margarita, MD  cetirizine (  ZYRTEC) 10 MG tablet Take 1 tablet (10 mg total) by mouth daily. 11/18/16   Dorena Dew, FNP  cloNIDine (CATAPRES) 0.2 MG tablet Take 1 tablet (0.2 mg total) by mouth daily. 10/10/16   Sueanne Margarita, MD  cyclobenzaprine (FLEXERIL) 5 MG tablet Take 1 tablet (5 mg total) by mouth 3 (three) times daily as needed for muscle spasms. 09/19/16   Dorena Dew, FNP  doxazosin (CARDURA) 4 MG tablet Take 1.5 tablets (6 mg total) by mouth at bedtime. 08/13/16   Sueanne Margarita, MD  doxycycline (VIBRAMYCIN) 100 MG capsule Take 1 capsule (100 mg total) by mouth 2 (two) times daily. 12/23/16   Lysbeth Penner, FNP  fluticasone (FLONASE) 50 MCG/ACT nasal spray  Place 2 sprays into both nostrils daily. 06/01/15   Micheline Chapman, NP  fluticasone (FLONASE) 50 MCG/ACT nasal spray PLACE 2 SPRAYS INTO BOTH NOSTRILS DAILY. 11/13/16   Micheline Chapman, NP  furosemide (LASIX) 40 MG tablet Take 1 tablet (40 mg total) by mouth daily. 08/13/16   Sueanne Margarita, MD  gabapentin (NEURONTIN) 300 MG capsule Take 1 capsule (300 mg total) by mouth 3 (three) times daily. 11/18/16   Dorena Dew, FNP  hydrALAZINE (APRESOLINE) 50 MG tablet TAKE 2 TABLETS BY MOUTH 3 TIMES DAILY 08/13/16   Sueanne Margarita, MD  isosorbide mononitrate (IMDUR) 30 MG 24 hr tablet Take 1 tablet (30 mg total) by mouth daily. 08/13/16   Sueanne Margarita, MD  labetalol (NORMODYNE) 200 MG tablet Take 1 tablet (200 mg total) by mouth 2 (two) times daily. 10/10/16 10/05/17  Sueanne Margarita, MD  meloxicam (MOBIC) 7.5 MG tablet Take 1 tablet (7.5 mg total) by mouth daily. 11/18/16   Dorena Dew, FNP  methocarbamol (ROBAXIN) 500 MG tablet Take 1 tablet (500 mg total) by mouth 2 (two) times daily. Prn muscle spasm. 12/10/16   Janne Napoleon, NP  predniSONE (DELTASONE) 50 MG tablet 1 tab po daily for 6 days. Take with food. 12/10/16   Janne Napoleon, NP  spironolactone (ALDACTONE) 25 MG tablet Take 0.5 tablets (12.5 mg total) by mouth daily. 08/13/16   Sueanne Margarita, MD  traMADol (ULTRAM) 50 MG tablet Take 1 tablet (50 mg total) by mouth every 6 (six) hours as needed. 09/19/16   Dorena Dew, FNP   Meds Ordered and Administered this Visit  Medications - No data to display  BP (!) 168/99 (BP Location: Right Arm)   Pulse 72   Temp 97.7 F (36.5 C) (Oral)   Resp 16   SpO2 99%  No data found.   Physical Exam  Constitutional: He appears well-developed and well-nourished.  HENT:  Head: Normocephalic and atraumatic.  Eyes: Conjunctivae and EOM are normal. Pupils are equal, round, and reactive to light.  Neck: Normal range of motion. Neck supple.  Cardiovascular: Normal rate, regular rhythm and normal heart  sounds.   Pulmonary/Chest: Effort normal and breath sounds normal.  Abdominal: Soft. Bowel sounds are normal.  Skin:  Right lateral leg with erythema and tenderness.  Nursing note and vitals reviewed.   Urgent Care Course     Procedures (including critical care time)  Labs Review Labs Reviewed - No data to display  Imaging Review No results found.   Visual Acuity Review  Right Eye Distance:   Left Eye Distance:   Bilateral Distance:    Right Eye Near:   Left Eye Near:    Bilateral Near:  MDM   1. Spider bite wound, accidental or unintentional, initial encounter   2. Cellulitis of right lower extremity    Doxycycline 100mg  one po bid x 10 days #20     Lysbeth Penner, FNP 12/23/16 [phone]

## 2016-12-31 ENCOUNTER — Other Ambulatory Visit: Payer: Self-pay

## 2016-12-31 MED ORDER — METHOCARBAMOL 500 MG PO TABS: 500.0000 mg | ORAL_TABLET | Freq: Two times a day (BID) | ORAL | 0 refills | Status: DC

## 2016-12-31 MED FILL — METHOCARBAMOL 500 MG TABLET: 500 | 10 days supply | Qty: 20 | Fill #0

## 2016-12-31 NOTE — Telephone Encounter (Signed)
Refilled rx for methocarbamol 500mg  #20 sent into pharmacy. Will need a refill for future refills. Thanks!

## 2017-01-07 ENCOUNTER — Ambulatory Visit (INDEPENDENT_AMBULATORY_CARE_PROVIDER_SITE_OTHER): Payer: No Typology Code available for payment source | Admitting: Family Medicine

## 2017-01-07 VITALS — BP 168/92 | HR 77 | Temp 97.6°F | Resp 16 | Ht 73.0 in | Wt 242.0 lb

## 2017-01-07 DIAGNOSIS — I10 Essential (primary) hypertension: Secondary | ICD-10-CM

## 2017-01-07 DIAGNOSIS — M5442 Lumbago with sciatica, left side: Secondary | ICD-10-CM

## 2017-01-07 DIAGNOSIS — G8929 Other chronic pain: Secondary | ICD-10-CM

## 2017-01-07 DIAGNOSIS — M62830 Muscle spasm of back: Secondary | ICD-10-CM

## 2017-01-07 DIAGNOSIS — M5441 Lumbago with sciatica, right side: Secondary | ICD-10-CM

## 2017-01-07 DIAGNOSIS — M2550 Pain in unspecified joint: Secondary | ICD-10-CM

## 2017-01-07 LAB — COMPLETE METABOLIC PANEL WITH GFR
AG Ratio: 1.3 Ratio (ref 1.0–2.5)
ALT: 18 U/L (ref 9–46)
AST: 17 U/L (ref 10–35)
Albumin: 4 g/dL (ref 3.6–5.1)
Alkaline Phosphatase: 48 U/L (ref 40–115)
BUN/Creatinine Ratio: 13.4 Ratio (ref 6–22)
BUN: 19 mg/dL (ref 7–25)
CALCIUM: 9.2 mg/dL (ref 8.6–10.3)
CHLORIDE: 104 mmol/L (ref 98–110)
CO2: 22 mmol/L (ref 20–31)
Creat: 1.42 mg/dL — ABNORMAL HIGH (ref 0.70–1.33)
GFR, EST AFRICAN AMERICAN: 65 mL/min (ref >=60)
GFR, EST NON AFRICAN AMERICAN: 56 mL/min — AB (ref >=60)
Globulin: 3 g/dL (ref 1.9–3.7)
Glucose, Bld: 72 mg/dL (ref 65–99)
Potassium: 4.3 mmol/L (ref 3.5–5.3)
Sodium: 137 mmol/L (ref 135–146)
TOTAL PROTEIN: 7 g/dL (ref 6.1–8.1)
Total Bilirubin: 0.5 mg/dL (ref 0.2–1.2)

## 2017-01-07 MED ORDER — DICLOFENAC SODIUM 3 % TD GEL: 1.0000 "application " | Freq: Four times a day (QID) | TRANSDERMAL | 5 refills | Status: DC | PRN

## 2017-01-07 MED ORDER — METHOCARBAMOL 500 MG PO TABS: 500.0000 mg | ORAL_TABLET | Freq: Two times a day (BID) | ORAL | 0 refills | Status: DC

## 2017-01-07 MED ORDER — GABAPENTIN 400 MG PO CAPS: 400.0000 mg | ORAL_CAPSULE | Freq: Three times a day (TID) | ORAL | 2 refills | Status: DC

## 2017-01-07 MED FILL — GABAPENTIN 400 MG CAPSULE: 400 | 30 days supply | Qty: 90 | Fill #0

## 2017-01-07 MED FILL — DICLOFENAC SODIUM 3% GEL: 3 | 25 days supply | Qty: 100 | Fill #0

## 2017-01-07 NOTE — Progress Notes (Signed)
Subjective:    Patient ID: Daniel Joseph, male    DOB: May 23, 1961, 56 y.o.   MRN: [id number] Mr. Trevaughn Schear, a 56 year old male with a history of chronic back pain, hypertensive heart disease, and uncontrolled hypertension presents complaining of worsening low back pain. Patient had an xray of lower back next month, which shows degenerative disc disease. He states that current pain intensity is 10/10 described as constant and throbbing. Pertinent negatives include no bladder incontinence, chest pain, dysuria, headaches, or leg pain. He endorses numbness to lower extremities.  Hypertension  This is a chronic (Blood pressure markedly elevated upon arrival. Patient is currently having 10/10 back pain) problem. The current episode started more than 1 year ago. The problem has been rapidly worsening since onset. The problem is uncontrolled. Pertinent negatives include no anxiety, chest pain, headaches, orthopnea, palpitations, peripheral edema, PND, shortness of breath or sweats.  Back Pain  This is a chronic problem. The current episode started more than 1 year ago. The problem occurs constantly. The pain is present in the lumbar spine. The quality of the pain is described as shooting and burning. The pain radiates to the left thigh and right thigh. The pain is at a severity of 10/10. The pain is severe. The pain is the same all the time. The symptoms are aggravated by twisting, stress and position. Associated symptoms include numbness and weakness. Pertinent negatives include no bladder incontinence, bowel incontinence, chest pain, dysuria, headaches, leg pain, paresis or paresthesias.   Past Medical History:  Diagnosis Date  . Benign essential HTN 01/19/2014   Renal Artery [place] 6/16:  No RAS, No AAA  . Cardiomyopathy, dilated, nonischemic (HCC)    EF 45-50% bye echo 08/2015  . Chronic combined systolic and diastolic CHF (congestive heart failure) ([place]) 01/21/2014  . Gout   . Mitral valve  regurgitation    Moderate MR by echo 2018  . PVC's (premature ventricular contractions)    nonsustained VT as well as PVCs - no ICD indicated due to EF 40-45%  . 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 Marital status: Single    Spouse name: N/A  . Number of children: N/A  . Years of education: N/A   Occupational History  . Not on file.   Social History Main Topics  . Smoking status: Never Smoker  . Smokeless tobacco: Never Used  . Alcohol use No     Comment: social  . Drug use: No  . Sexual activity: Not on file   Other Topics Concern  . Not on file   Social History Narrative  . No narrative on file   Review of Systems  Constitutional: Negative for fatigue and unexpected weight change.  HENT: Negative.   Eyes: Positive for redness. Negative for photophobia.  Respiratory: Negative for shortness of breath.   Cardiovascular: Negative.  Negative for chest pain, palpitations, orthopnea, leg swelling and PND.  Gastrointestinal: Negative.  Negative for bowel incontinence.  Endocrine: Negative for cold intolerance, heat intolerance, polydipsia, polyphagia and polyuria.  Genitourinary: Negative.  Negative for bladder incontinence, decreased urine volume, difficulty urinating, dysuria, hematuria and urgency.  Musculoskeletal: Positive for back pain.  Skin: Negative.   Allergic/Immunologic: Positive for environmental allergies. Negative for immunocompromised state.  Neurological: Positive for weakness and numbness. Negative for headaches and paresthesias.  Hematological: Negative.   Psychiatric/Behavioral: Negative.        Objective:   Physical  Exam  Constitutional: He is oriented to person, place, and time. He appears well-developed and well-nourished.  HENT:  Head: Normocephalic and atraumatic.  Right Ear: External ear normal.  Left Ear: External ear normal.  Nose: Nose normal.  Mouth/Throat: Oropharynx is clear and  moist.  Eyes: Conjunctivae and EOM are normal. Pupils are equal, round, and reactive to light.  Neck: Normal range of motion. Neck supple.  Cardiovascular: Normal rate, regular rhythm, normal heart sounds and intact distal pulses.   Pulmonary/Chest: Effort normal and breath sounds normal.  Abdominal: Soft. Bowel sounds are normal.  Musculoskeletal:       Lumbar back: He exhibits decreased range of motion, tenderness, pain and spasm. He exhibits no swelling and no edema.  Neurological: He is alert and oriented to person, place, and time. He has normal reflexes.  Skin: Skin is warm and dry.  Psychiatric: He has a normal mood and affect. His behavior is normal. Judgment and thought content normal.      BP (!) 168/92 (BP Location: Right Arm, Patient Position: Sitting, Cuff Size: Large) Comment: manual  Pulse 77   Temp 97.6 F (36.4 C) (Oral)   Resp 16   Ht 6\' 1"  (1.854 m)   Wt 242 lb (109.8 kg)   SpO2 100%   BMI 31.93 kg/m  Assessment & Plan:  1. Chronic bilateral low back pain with bilateral sciatica Apply warm, moist compresses to back as needed - gabapentin (NEURONTIN) 400 MG capsule; Take 1 capsule (400 mg total) by mouth 3 (three) times daily.  Dispense: 90 capsule; Refill: 2 - Diclofenac Sodium 3 % GEL; Place 1 application onto the skin 4 (four) times daily as needed.  Dispense: 100 g; Refill: 5  2. Back spasm - Diclofenac Sodium 3 % GEL; Place 1 application onto the skin 4 (four) times daily as needed.  Dispense: 100 g; Refill: 5 - methocarbamol (ROBAXIN) 500 MG tablet; Take 1 tablet (500 mg total) by mouth 2 (two) times daily. Prn muscle spasm.  Dispense: 30 tablet; Refill: 0 - Rheumatoid factor - Sedimentation Rate - Uric Acid  3. Arthralgia, unspecified joint Previous uric acid level was elevated 1 year ago. Will repeat uric acid level.   4. Essential hypertension Patient to schedule a follow up with cardiology.  - COMPLETE METABOLIC PANEL WITH GFR. Reviewed  medications, patient is on 5 anti-hypertensive agents. Blood pressure is not controlled on medication regimen. Will follow up with cardiology.  - Microalbumin/Creatinine Ratio, Urine   RTC: Will follow up by phone with laboratory values  Donia Pounds  MSN, FNP-C [facility] [address] [place], [place] [address] [phone]

## 2017-01-08 ENCOUNTER — Encounter: Payer: Self-pay | Admitting: Family Medicine

## 2017-01-08 LAB — URIC ACID: Uric Acid, Serum: 8.2 mg/dL — ABNORMAL HIGH (ref 4.0–8.0)

## 2017-01-08 LAB — SEDIMENTATION RATE: SED RATE: 4 mm/h (ref 0–20)

## 2017-01-08 LAB — RHEUMATOID FACTOR: Rhuematoid fact SerPl-aCnc: <14 IU/mL (ref <14)

## 2017-01-08 MED FILL — cloNIDine HCL 0.2 MG TABS: 0.2 | 30 days supply | Qty: 30 | Fill #1

## 2017-01-09 ENCOUNTER — Other Ambulatory Visit: Payer: Self-pay | Admitting: Family Medicine

## 2017-01-09 DIAGNOSIS — M109 Gout, unspecified: Secondary | ICD-10-CM

## 2017-01-09 MED ORDER — ALLOPURINOL 100 MG PO TABS: 100.0000 mg | ORAL_TABLET | Freq: Two times a day (BID) | ORAL | 1 refills | Status: DC

## 2017-01-09 NOTE — Progress Notes (Addendum)
Reviewed laboratory values, uric acid level is 8.5, will add allopurinol to medication regimen. The nature of gout is fully explained, including dietary relationship, acute and interval phase and treatment of both. Long term complications such as kidney stones, tophi and arthritis are discussed. Avoidance of alcohol recommended, and written literature is given along with a low purine diet. Indications for the use of allopurinol for prophylaxis and the use of colchicine to prevent or treat flare-ups is also discussed. Proper use of indomethacin for acute attacks discussed, and its side effects. Call if further attacks occur, or this one does not resolve promptly.    Meds ordered this encounter  Medications  . allopurinol (ZYLOPRIM) 100 MG tablet    Sig: Take 1 tablet (100 mg total) by mouth 2 (two) times daily.    Dispense:  60 tablet    Refill:  Alger  MSN, FNP-C [facility] [address] [place], [place] [address] [phone]

## 2017-01-10 MED FILL — ALLOPURINOL 100 MG TABLET: 100 | 30 days supply | Qty: 60 | Fill #0

## 2017-01-10 NOTE — Progress Notes (Signed)
Called and left a message advising of Uric Acid Level being elevated and the need to start allopurinol as directed. Asked if patient had any questions to call us back and left office number. Thanks!

## 2017-01-24 ENCOUNTER — Other Ambulatory Visit: Payer: Self-pay | Admitting: Family Medicine

## 2017-01-24 ENCOUNTER — Telehealth: Payer: Self-pay

## 2017-01-24 DIAGNOSIS — M545 Low back pain, unspecified: Secondary | ICD-10-CM

## 2017-01-24 MED ORDER — TRAMADOL HCL 50 MG PO TABS: 50.0000 mg | ORAL_TABLET | Freq: Four times a day (QID) | ORAL | 0 refills | Status: DC | PRN

## 2017-01-24 MED FILL — traMADol HCL 50 MG TABS: 50 | 4 days supply | Qty: 15 | Fill #0

## 2017-01-24 NOTE — Telephone Encounter (Signed)
Called and advised patient that he has been prescribed a rx for tramadol 50mg  #15 tablets, he needs to pick that up here in office. He can take this until he is seen by ortho. I have also provided him with the number to call [facility] [place] [facility] to apply for help. He was informed to call back to our office when this is approved so we can refer him to an orthopedic with their affiliation.

## 2017-01-24 NOTE — Telephone Encounter (Signed)
Patient called and is complaining of back pain still and wants to know if you can give him something else for pain even if its Ibuprofen. He says he is using everything you gave him last time and it's not helping. He only has [facility] and there are no openings now to ortho/sports med. Please advise. Thanks!

## 2017-01-24 NOTE — Progress Notes (Signed)
Reviewed [place] Substance Reporting system prior to prescribing opiate medications. No inconsistencies noted.   Meds ordered this encounter  Medications  . traMADol (ULTRAM) 50 MG tablet    Sig: Take 1 tablet (50 mg total) by mouth every 6 (six) hours as needed.    Dispense:  15 tablet    Refill:  0    Order Specific Question:   Supervising Provider    Answer:   [name] [[id number]]     Donia Pounds  MSN, FNP-C [facility] [address] [place], [place] [address] [phone]

## 2017-02-11 ENCOUNTER — Other Ambulatory Visit: Payer: Self-pay | Admitting: Cardiology

## 2017-02-18 ENCOUNTER — Encounter: Payer: Self-pay | Admitting: Family Medicine

## 2017-02-18 ENCOUNTER — Ambulatory Visit (INDEPENDENT_AMBULATORY_CARE_PROVIDER_SITE_OTHER): Payer: No Typology Code available for payment source | Admitting: Family Medicine

## 2017-02-18 VITALS — BP 140/88 | HR 66 | Temp 97.7°F | Resp 16 | Ht 73.0 in | Wt 236.0 lb

## 2017-02-18 DIAGNOSIS — M25559 Pain in unspecified hip: Secondary | ICD-10-CM

## 2017-02-18 DIAGNOSIS — N183 Chronic kidney disease, stage 3 unspecified: Secondary | ICD-10-CM

## 2017-02-18 DIAGNOSIS — M5442 Lumbago with sciatica, left side: Secondary | ICD-10-CM

## 2017-02-18 DIAGNOSIS — I10 Essential (primary) hypertension: Secondary | ICD-10-CM

## 2017-02-18 DIAGNOSIS — G8929 Other chronic pain: Secondary | ICD-10-CM

## 2017-02-18 DIAGNOSIS — M5441 Lumbago with sciatica, right side: Secondary | ICD-10-CM

## 2017-02-18 DIAGNOSIS — E790 Hyperuricemia without signs of inflammatory arthritis and tophaceous disease: Secondary | ICD-10-CM

## 2017-02-18 LAB — POCT URINALYSIS DIP (DEVICE)
Bilirubin Urine: NEGATIVE
Glucose, UA: NEGATIVE mg/dL
HGB URINE DIPSTICK: NEGATIVE
Ketones, ur: NEGATIVE mg/dL
LEUKOCYTES UA: NEGATIVE
Nitrite: NEGATIVE
PH: 5.5 (ref 5.0–8.0)
Protein, ur: 30 mg/dL — AB
SPECIFIC GRAVITY, URINE: 1.025 (ref 1.005–1.030)
UROBILINOGEN UA: 0.2 mg/dL (ref 0.0–1.0)

## 2017-02-18 MED ORDER — DEXAMETHASONE SODIUM PHOSPHATE 4 MG/ML IJ SOLN: 4.0000 mg | Freq: Once | INTRAMUSCULAR | Status: DC

## 2017-02-18 MED ORDER — TRAMADOL HCL 50 MG PO TABS: 50.0000 mg | ORAL_TABLET | Freq: Four times a day (QID) | ORAL | 0 refills | Status: DC | PRN

## 2017-02-18 MED ORDER — GABAPENTIN 400 MG PO CAPS: 400.0000 mg | ORAL_CAPSULE | Freq: Four times a day (QID) | ORAL | 2 refills | Status: DC

## 2017-02-18 MED ORDER — DEXAMETHASONE SODIUM PHOSPHATE 4 MG/ML IJ SOLN
4.0000 mg | Freq: Once | INTRAMUSCULAR | Status: AC
  Administered 2017-02-18: 4 mg via INTRAMUSCULAR

## 2017-02-18 NOTE — Progress Notes (Signed)
Subjective:    Patient ID: Daniel Joseph, male    DOB: 1960-09-17, 56 y.o.   MRN: [id number]  Mr. Kamran Coker, a 56 year old male with a history of hypertension, CKD, back and hip pain presents with pain. His current pain intensity is 8/10. He has been taking Tylenol OTC with minimal relief. Patient has been referred to orthopedic specialist, but has been unable to schedule appointment due to lack of payer source.    Back Pain  This is a chronic problem. The current episode started more than 1 year ago. The problem occurs constantly. The pain is present in the lumbar spine. The quality of the pain is described as shooting and burning. The pain radiates to the left thigh and right thigh. The pain is at a severity of 8/10. The pain is severe. The pain is the same all the time. The symptoms are aggravated by twisting, stress and position. Associated symptoms include numbness and weakness. Pertinent negatives include no bladder incontinence, bowel incontinence, dysuria, leg pain, paresis or paresthesias.  Hip Pain   The pain is present in the right hip and left hip. The pain is at a severity of 8/10. The pain is severe. The pain has been intermittent since onset. Associated symptoms include an inability to bear weight and numbness. The symptoms are aggravated by movement and weight bearing. He has tried acetaminophen, heat, ice and non-weight bearing for the symptoms.   Past Medical History:  Diagnosis Date  . Benign essential HTN 01/19/2014   Renal Artery [place] 6/16:  No RAS, No AAA  . Cardiomyopathy, dilated, nonischemic (HCC)    EF 45-50% bye echo 08/2015  . Chronic combined systolic and diastolic CHF (congestive heart failure) ([place]) 01/21/2014  . Gout   . Mitral valve regurgitation    Moderate MR by echo 2018  . PVC's (premature ventricular contractions)    nonsustained VT as well as PVCs - no ICD indicated due to EF 40-45%  . 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 Marital status: Single    Spouse name: N/A  . Number of children: N/A  . Years of education: N/A   Occupational History  . Not on file.   Social History Main Topics  . Smoking status: Never Smoker  . Smokeless tobacco: Never Used  . Alcohol use No     Comment: social  . Drug use: No  . Sexual activity: Not on file   Other Topics Concern  . Not on file   Social History Narrative  . No narrative on file   Review of Systems  Constitutional: Negative for fatigue and unexpected weight change.  HENT: Negative.   Eyes: Positive for redness. Negative for photophobia.  Cardiovascular: Negative.  Negative for leg swelling.  Gastrointestinal: Negative.  Negative for bowel incontinence.  Endocrine: Negative for cold intolerance, heat intolerance, polydipsia, polyphagia and polyuria.  Genitourinary: Negative.  Negative for bladder incontinence, decreased urine volume, difficulty urinating, dysuria, hematuria and urgency.  Musculoskeletal: Positive for back pain.  Skin: Negative.   Allergic/Immunologic: Positive for environmental allergies. Negative for immunocompromised state.  Neurological: Positive for weakness and numbness. Negative for paresthesias.  Hematological: Negative.   Psychiatric/Behavioral: Negative.        Objective:   Physical Exam  Constitutional: He is oriented to person, place, and time. He appears well-developed and well-nourished.  HENT:  Head: Normocephalic and atraumatic.  Right Ear: External ear  normal.  Left Ear: External ear normal.  Nose: Nose normal.  Mouth/Throat: Oropharynx is clear and moist.  Eyes: Conjunctivae and EOM are normal. Pupils are equal, round, and reactive to light.  Neck: Normal range of motion. Neck supple.  Cardiovascular: Normal rate, regular rhythm, normal heart sounds and intact distal pulses.   Pulmonary/Chest: Effort normal and breath sounds normal.  Abdominal: Soft. Bowel sounds are normal.   Musculoskeletal:       Right hip: He exhibits decreased range of motion and decreased strength.       Left hip: He exhibits decreased range of motion and decreased strength.       Lumbar back: He exhibits decreased range of motion, tenderness, pain and spasm. He exhibits no swelling and no edema.  Neurological: He is alert and oriented to person, place, and time. He has normal reflexes.  Skin: Skin is warm and dry.  Psychiatric: He has a normal mood and affect. His behavior is normal. Judgment and thought content normal.      BP (!) 148/94 (BP Location: Right Arm, Patient Position: Sitting, Cuff Size: Large) Comment: manually  Pulse 66   Temp 97.7 F (36.5 C) (Oral)   Resp 16   Ht 6\' 1"  (1.854 m)   Wt 236 lb (107 kg)   SpO2 99%   BMI 31.14 kg/m  Assessment & Plan:  1. Chronic bilateral low back pain with bilateral sciatica Mr. Telford warrants an orthopedic evaluation at this juncture. He has been unable to follow up due to lack of payer source.  Will continue to manage pain in primary care.   - gabapentin (NEURONTIN) 400 MG capsule; Take 1 capsule (400 mg total) by mouth 4 (four) times daily.  Dispense: 120 capsule; Refill: 2 - traMADol (ULTRAM) 50 MG tablet; Take 1 tablet (50 mg total) by mouth every 6 (six) hours as needed.  Dispense: 30 tablet; Refill: 0 - POCT urinalysis dip (device) - dexamethasone (DECADRON) injection 4 mg; Inject 1 mL (4 mg total) into the muscle once.  2. Chronic hip pain, unspecified laterality - gabapentin (NEURONTIN) 400 MG capsule; Take 1 capsule (400 mg total) by mouth 4 (four) times daily.  Dispense: 120 capsule; Refill: 2 - traMADol (ULTRAM) 50 MG tablet; Take 1 tablet (50 mg total) by mouth every 6 (six) hours as needed.  Dispense: 30 tablet; Refill: 0 - dexamethasone (DECADRON) injection 4 mg; Inject 1 mL (4 mg total) into the muscle once.  3. Essential hypertension Blood pressure has improved. Continue medications as prescribed. He is to follow  up with cardiology as scheduled - BASIC METABOLIC PANEL WITH GFR - POCT urinalysis dip (device)  4. Stage 3 chronic kidney disease Mr. Bona also warrants a referral to nephrology for further workup and evaluation. [facility] for financial assistance.  - BASIC METABOLIC PANEL WITH GFR  5. Elevated uric acid in blood - Uric Acid   RTC: 1 month for medication management   Donia Pounds  MSN, FNP-C [place], [place] [address] [phone]

## 2017-02-18 NOTE — Patient Instructions (Addendum)
Neuropathy:  Will increase Gabapentin 400 mg 4 times per day as needed  Chronic back/hip pain Dexamethosone 4 mg injection without complicaiton Tramadol 50 mg every 6 hours as needed for moderate to severe pain Will send referral to orthopedic specialists   Chronic kidney disease:   Will notify with any abnormal lab results.

## 2017-02-19 LAB — BASIC METABOLIC PANEL WITH GFR
BUN: 15 mg/dL (ref 7–25)
CALCIUM: 9.1 mg/dL (ref 8.6–10.3)
CO2: 19 mmol/L — AB (ref 20–31)
Chloride: 106 mmol/L (ref 98–110)
Creat: 1.45 mg/dL — ABNORMAL HIGH (ref 0.70–1.33)
GFR, EST AFRICAN AMERICAN: 63 mL/min (ref >=60)
GFR, EST NON AFRICAN AMERICAN: 55 mL/min — AB (ref >=60)
GLUCOSE: 77 mg/dL (ref 65–99)
Potassium: 3.9 mmol/L (ref 3.5–5.3)
Sodium: 138 mmol/L (ref 135–146)

## 2017-02-19 LAB — URIC ACID: Uric Acid, Serum: 8.4 mg/dL — ABNORMAL HIGH (ref 4.0–8.0)

## 2017-03-04 MED FILL — BENAZEPRIL HCL 40 MG TABLET: 40 | 30 days supply | Qty: 30 | Fill #0

## 2017-03-04 MED FILL — AMLODIPINE BESYLATE 10 MG T: 10 | 30 days supply | Qty: 30 | Fill #1

## 2017-03-04 MED FILL — ?CLONIDINE HCL 0.2 MG TABLE: 0.2 | 30 days supply | Qty: 30 | Fill #2

## 2017-03-04 MED FILL — ?FUROSEMIDE 40 MG TABLET: 40 | 30 days supply | Qty: 30 | Fill #0

## 2017-04-28 ENCOUNTER — Encounter: Payer: Self-pay | Admitting: Cardiology

## 2017-04-28 ENCOUNTER — Ambulatory Visit: Payer: No Typology Code available for payment source | Admitting: Family Medicine

## 2017-05-01 ENCOUNTER — Other Ambulatory Visit: Payer: Self-pay | Admitting: Cardiology

## 2017-05-01 MED FILL — cloNIDine HCL 0.2 MG TABS: 0.2 | 30 days supply | Qty: 30 | Fill #3

## 2017-05-01 MED FILL — FUROSEMIDE 40 MG TABLET: 40 | 30 days supply | Qty: 30 | Fill #1

## 2017-05-01 MED FILL — AMLODIPINE BESYLATE 10 MG T: 10 | 30 days supply | Qty: 30 | Fill #2

## 2017-05-05 ENCOUNTER — Encounter: Payer: Self-pay | Admitting: Family Medicine

## 2017-05-05 ENCOUNTER — Other Ambulatory Visit: Payer: Self-pay | Admitting: Family Medicine

## 2017-05-05 ENCOUNTER — Encounter (INDEPENDENT_AMBULATORY_CARE_PROVIDER_SITE_OTHER): Payer: Self-pay | Admitting: Family Medicine

## 2017-05-05 VITALS — BP 188/112 | HR 74 | Temp 98.7°F | Resp 16 | Ht 73.0 in | Wt 230.0 lb

## 2017-05-05 DIAGNOSIS — I10 Essential (primary) hypertension: Secondary | ICD-10-CM

## 2017-05-05 MED ORDER — AMLODIPINE BESYLATE 10 MG PO TABS: 10.0000 mg | ORAL_TABLET | Freq: Every day | ORAL | 4 refills | Status: DC

## 2017-05-05 MED ORDER — HYDRALAZINE HCL 50 MG PO TABS: ORAL_TABLET | ORAL | 2 refills | Status: DC

## 2017-05-05 MED ORDER — LABETALOL HCL 200 MG PO TABS: 200.0000 mg | ORAL_TABLET | Freq: Two times a day (BID) | ORAL | 3 refills | Status: DC

## 2017-05-05 MED ORDER — CLONIDINE HCL 0.2 MG PO TABS: 0.2000 mg | ORAL_TABLET | Freq: Every day | ORAL | 3 refills | Status: DC

## 2017-05-05 MED ORDER — FUROSEMIDE 40 MG PO TABS: 40.0000 mg | ORAL_TABLET | Freq: Every day | ORAL | 6 refills | Status: DC

## 2017-05-05 MED FILL — LABETALOL HCL 200 MG TABLET: 200 | 30 days supply | Qty: 60 | Fill #0

## 2017-05-05 MED FILL — hydrALAZINE HCL 50 MG TABS: 50 | 30 days supply | Qty: 180 | Fill #0

## 2017-05-08 NOTE — Progress Notes (Signed)
This encounter was created in error - please disregard.

## 2017-05-09 ENCOUNTER — Ambulatory Visit (INDEPENDENT_AMBULATORY_CARE_PROVIDER_SITE_OTHER): Payer: Self-pay | Admitting: Family Medicine

## 2017-05-09 ENCOUNTER — Encounter: Payer: Self-pay | Admitting: Family Medicine

## 2017-05-09 VITALS — BP 148/86 | HR 69 | Temp 97.8°F | Resp 16 | Ht 73.0 in | Wt 229.0 lb

## 2017-05-09 DIAGNOSIS — N183 Chronic kidney disease, stage 3 unspecified: Secondary | ICD-10-CM

## 2017-05-09 DIAGNOSIS — I43 Cardiomyopathy in diseases classified elsewhere: Secondary | ICD-10-CM

## 2017-05-09 DIAGNOSIS — I119 Hypertensive heart disease without heart failure: Secondary | ICD-10-CM

## 2017-05-09 DIAGNOSIS — M109 Gout, unspecified: Secondary | ICD-10-CM

## 2017-05-09 LAB — POCT URINALYSIS DIP (DEVICE)
Bilirubin Urine: NEGATIVE
Glucose, UA: NEGATIVE mg/dL
Hgb urine dipstick: NEGATIVE
KETONES UR: NEGATIVE mg/dL
Leukocytes, UA: NEGATIVE
NITRITE: NEGATIVE
PH: 5.5 (ref 5.0–8.0)
Protein, ur: 30 mg/dL — AB
Specific Gravity, Urine: 1.02 (ref 1.005–1.030)
UROBILINOGEN UA: 0.2 mg/dL (ref 0.0–1.0)

## 2017-05-09 MED ORDER — ALLOPURINOL 100 MG PO TABS: 100.0000 mg | ORAL_TABLET | Freq: Two times a day (BID) | ORAL | 5 refills | Status: DC

## 2017-05-09 NOTE — Patient Instructions (Addendum)
Will continue all home medications, no changes warranted on today Cardiomyopathy, Adult Cardiomyopathy is a long-term (chronic) disease of the heart muscle. The disease makes the heart muscle thick, weak, or stiff. As a result, the heart works harder to pump blood. Over time, cardiomyopathy can lead to heart failure. There are several types of cardiomyopathy:  Dilated cardiomyopathy. This type causes the ventricles to become weak and stretched out.  Hypertrophic cardiomyopathy. This type causes the heart muscle to thicken.  Restrictive cardiomyopathy. This type causes the heart muscle to become stiff.  Ischemic cardiomyopathy. This type involves narrowing arteries that cause the walls of the heart to get thinner.  Peripartum cardiomyopathy. This type occurs during pregnancy or shortly after pregnancy.  What are the causes? This condition may be caused by:  A gene that is passed down (inherited) from a family member.  A medical condition that damages the heart, such as: ? Diabetes. ? High blood pressure. ? Viral infection of the heart. ? Heart attack. ? Coronary heart disease.  Alcoholism.  Using illegal drugs or some prescription medicines.  Pregnancy.  Your body absorbing and storing too much iron (hemochromatosis).  Autoimmune diseases, connective tissue diseases, endocrine diseases, and muscle diseases.  Cancer treatments.  Buildup of proteins in your organs (amyloidosis), or inflammation in your organs (sarcoidosis).  Often, the cause is not known. What increases the risk? This condition is more likely to develop in people who:  Have a family history of cardiomyopathy or other heart problems.  Are overweight or obese.  Use illegal drugs.  Abuse alcohol.  Have a medical condition that damages the heart.  What are the signs or symptoms? Symptoms of this condition include:  Shortness of breath, especially during activity.  Fatigue.  An irregular  heartbeat and heart murmurs.  Dizziness.  Light-headedness.  Fainting.  Chest pain.  Coughing.  Swelling in the lower legs, ankles, feet, abdomen, and neck veins.  Often, people with this condition have no symptoms. How is this diagnosed? This condition is diagnosed based on:  Your symptoms and medical history.  A physical exam.  Tests.  Tests may include:  Blood tests.  Imaging studies of your heart, such as: ? X-rays. ? An echocardiogram. ? An MRI.  An electrocardiogram (ECG). This records your heart's electrical activity.  A test in which you wear a portable device (event monitor) to record your heart's electrical activity while you go about your day.  A stress test. This monitors your heart's activity while exercising.  Cardiac catheterization. This procedure checks the blood pressure and blood flow in your heart.  An angiogram. This is an injection of dye into your arteries before imaging studies are taken.  Heart tissue biopsy. This removes a sample of heart tissue for examination.  How is this treated?  Treatment for this condition depends on the type of cardiomyopathy you have and the severity of your symptoms. If you do not have symptoms, you may not need treatment. If you need treatment, it may include:  Lifestyle changes, such as: ? Eating a heart-healthy diet that includes plenty of fruits, vegetables, and whole grains, and cutting down on salt (sodium). ? Maintaining a healthy weight, and losing weight, if needed. ? Getting regular exercise. ? Quitting smoking, if you smoke. ? Avoiding alcohol. ? Medicine to:  Lower your blood pressure.  Slow down your heart rate.  Keep your heart beating in a steady rhythm.  Clear excess fluids from your body.  Prevent blood clots.  Balance  minerals (electrolytes) in your body and get rid of extra sodium in your body.  Reduce inflammation.  Strengthen your heartbeat. ? Surgery to:  Repair a  defect.  Remove thickened tissue.  Destroy tissues in the area of abnormal electrical activity (ablation).  Implant a device to treat serious heart rhythm problems (implantable cardioverter-defibrillator, or ICD), or a pacemaker.  Replace your heart (heart transplant) if all other treatments have failed (end stage).  Other treatments may include cardiac resynchronization therapy (CRT) or a left ventricular assist device (LVAD). Follow these instructions at home: Lifestyle  Eat a heart-healthy diet. Work with your health care provider or a registered dietitian to learn about healthy eating options.  Maintain a healthy weight.  Stay physically active. Ask your health care provider to suggest some activities that are good for you.  Do not use any products that contain nicotine or tobacco, such as cigarettes and e-cigarettes. If you need help quitting, ask your health care provider.  Limit alcohol intake to no more than one drink per day for nonpregnant women and no more than two drinks per day for men. One drink equals 12 oz of beer, 5 oz of wine, or 1 oz of hard liquor.  Try to get at least 7 hours of sleep each night.  Find healthy ways to manage stress. General instructions  Take over-the-counter and prescription medicines only as told by your health care provider. Some medicines can be dangerous for your heart.  Tell all health care providers, including your dentist, that you have cardiomyopathy. When you visit the dentist or have surgery, ask your health care provider if you need antibiotics before having dental care or before the surgery.  Ask your health care provider if you should wear a medical identification bracelet. This may be important if you have a pacemaker or a defibrillator.  Make sure you get all recommended vaccinations and an annual flu shot.  Work closely with your health care provider to manage any long-lasting (chronic) conditions.  Keep all follow-up  visits as told by your health care provider. This is important, even if you do not have any symptoms. Your health care provider may need to make sure your condition is not getting worse. How is this prevented? This condition cannot be prevented. Parents, siblings, and children of people with this condition may be at risk for the condition. It is a good idea for them to get screened for the condition because it is best when cardiomyopathy is found early. Screening is done with an ECG and echocardiogram. People who want to start a family may also want to meet with a genetic counselor to discuss the risk of having a child with cardiomyopathy. Contact a health care provider if:  Your symptoms get worse.  You have new symptoms. Get help right away if:  You have severe chest pain.  You have shortness of breath.  You cough up pink, bubbly material.  You have sudden sweating.  You feel nauseous and you vomit.  You suddenly become light-headed or dizzy.  You feel your heart beating very quickly.  It feels like your heart is skipping beats. These symptoms may represent a serious problem that is an emergency. Do not wait to see if the symptoms will go away. Get medical help right away. Call your local emergency services (911 in the [place] Do not drive yourself to the hospital. This information is not intended to replace advice given to you by your health care provider. Make sure  you discuss any questions you have with your health care provider. Document Released: 11/15/2004 Document Revised: 04/30/2016 Document Reviewed: 03/04/2016 Elsevier Interactive Patient Education  2018 [place] Eating Plan DASH stands for "Dietary Approaches to Stop Hypertension." The DASH eating plan is a healthy eating plan that has been shown to reduce high blood pressure (hypertension). It may also reduce your risk for type 2 diabetes, heart disease, and stroke. The DASH eating plan may also help with  weight loss. What are tips for following this plan? General guidelines  Avoid eating more than 2,300 mg (milligrams) of salt (sodium) a day. If you have hypertension, you may need to reduce your sodium intake to 1,500 mg a day.  Limit alcohol intake to no more than 1 drink a day for nonpregnant women and 2 drinks a day for men. One drink equals 12 oz of beer, 5 oz of wine, or 1 oz of hard liquor.  Work with your health care provider to maintain a healthy body weight or to lose weight. Ask what an ideal weight is for you.  Get at least 30 minutes of exercise that causes your heart to beat faster (aerobic exercise) most days of the week. Activities may include walking, swimming, or biking.  Work with your health care provider or diet and nutrition specialist (dietitian) to adjust your eating plan to your individual calorie needs. Reading food labels  Check food labels for the amount of sodium per serving. Choose foods with less than 5 percent of the Daily Value of sodium. Generally, foods with less than 300 mg of sodium per serving fit into this eating plan.  To find whole grains, look for the word "whole" as the first word in the ingredient list. Shopping  Buy products labeled as "low-sodium" or "no salt added."  Buy fresh foods. Avoid canned foods and premade or frozen meals. Cooking  Avoid adding salt when cooking. Use salt-free seasonings or herbs instead of table salt or sea salt. Check with your health care provider or pharmacist before using salt substitutes.  Do not fry foods. Cook foods using healthy methods such as baking, boiling, grilling, and broiling instead.  Cook with heart-healthy oils, such as olive, canola, soybean, or sunflower oil. Meal planning   Eat a balanced diet that includes: ? 5 or more servings of fruits and vegetables each day. At each meal, try to fill half of your plate with fruits and vegetables. ? Up to 6-8 servings of whole grains each day. ? Less  than 6 oz of lean meat, poultry, or fish each day. A 3-oz serving of meat is about the same size as a deck of cards. One egg equals 1 oz. ? 2 servings of low-fat dairy each day. ? A serving of nuts, seeds, or beans 5 times each week. ? Heart-healthy fats. Healthy fats called Omega-3 fatty acids are found in foods such as flaxseeds and coldwater fish, like sardines, salmon, and mackerel.  Limit how much you eat of the following: ? Canned or prepackaged foods. ? Food that is high in trans fat, such as fried foods. ? Food that is high in saturated fat, such as fatty meat. ? Sweets, desserts, sugary drinks, and other foods with added sugar. ? Full-fat dairy products.  Do not salt foods before eating.  Try to eat at least 2 vegetarian meals each week.  Eat more home-cooked food and less restaurant, buffet, and fast food.  When eating at a restaurant, ask that your  food be prepared with less salt or no salt, if possible. What foods are recommended? The items listed may not be a complete list. Talk with your dietitian about what dietary choices are best for you. Grains Whole-grain or whole-wheat bread. Whole-grain or whole-wheat pasta. Brown rice. [place]. Bulgur. Whole-grain and low-sodium cereals. Pita bread. Low-fat, low-sodium crackers. Whole-wheat flour tortillas. Vegetables Fresh or frozen vegetables (raw, steamed, roasted, or grilled). Low-sodium or reduced-sodium tomato and vegetable juice. Low-sodium or reduced-sodium tomato sauce and tomato paste. Low-sodium or reduced-sodium canned vegetables. Fruits All fresh, dried, or frozen fruit. Canned fruit in natural juice (without added sugar). Meat and other protein foods Skinless chicken or [place]. Ground chicken or [place]. Pork with fat trimmed off. Fish and seafood. Egg whites. Dried beans, peas, or lentils. Unsalted nuts, nut butters, and seeds. Unsalted canned beans. Lean cuts of beef with fat trimmed off. Low-sodium, lean deli  meat. Dairy Low-fat (1%) or fat-free (skim) milk. Fat-free, low-fat, or reduced-fat cheeses. Nonfat, low-sodium ricotta or cottage cheese. Low-fat or nonfat yogurt. Low-fat, low-sodium cheese. Fats and oils Soft margarine without trans fats. Vegetable oil. Low-fat, reduced-fat, or light mayonnaise and salad dressings (reduced-sodium). Canola, safflower, olive, soybean, and sunflower oils. Avocado. Seasoning and other foods Herbs. Spices. Seasoning mixes without salt. Unsalted popcorn and pretzels. Fat-free sweets. What foods are not recommended? The items listed may not be a complete list. Talk with your dietitian about what dietary choices are best for you. Grains Baked goods made with fat, such as croissants, muffins, or some breads. Dry pasta or rice meal packs. Vegetables Creamed or fried vegetables. Vegetables in a cheese sauce. Regular canned vegetables (not low-sodium or reduced-sodium). Regular canned tomato sauce and paste (not low-sodium or reduced-sodium). Regular tomato and vegetable juice (not low-sodium or reduced-sodium). Angie Fava. Olives. Fruits Canned fruit in a light or heavy syrup. Fried fruit. Fruit in cream or butter sauce. Meat and other protein foods Fatty cuts of meat. Ribs. Fried meat. Berniece Salines. Sausage. Bologna and other processed lunch meats. Salami. Fatback. Hotdogs. Bratwurst. Salted nuts and seeds. Canned beans with added salt. Canned or smoked fish. Whole eggs or egg yolks. Chicken or [place] with skin. Dairy Whole or 2% milk, cream, and half-and-half. Whole or full-fat cream cheese. Whole-fat or sweetened yogurt. Full-fat cheese. Nondairy creamers. Whipped toppings. Processed cheese and cheese spreads. Fats and oils Butter. Stick margarine. Lard. Shortening. Ghee. Bacon fat. Tropical oils, such as coconut, palm kernel, or palm oil. Seasoning and other foods Salted popcorn and pretzels. Onion salt, garlic salt, seasoned salt, table salt, and sea salt. Worcestershire  sauce. Tartar sauce. Barbecue sauce. Teriyaki sauce. Soy sauce, including reduced-sodium. Steak sauce. Canned and packaged gravies. Fish sauce. Oyster sauce. Cocktail sauce. Horseradish that you find on the shelf. Ketchup. Mustard. Meat flavorings and tenderizers. Bouillon cubes. Hot sauce and Tabasco sauce. Premade or packaged marinades. Premade or packaged taco seasonings. Relishes. Regular salad dressings. Where to find more information:  National Heart, Lung, and [place]: https://wilson-eaton.com/  American Heart Association: www.heart.org Summary  The DASH eating plan is a healthy eating plan that has been shown to reduce high blood pressure (hypertension). It may also reduce your risk for type 2 diabetes, heart disease, and stroke.  With the DASH eating plan, you should limit salt (sodium) intake to 2,300 mg a day. If you have hypertension, you may need to reduce your sodium intake to 1,500 mg a day.  When on the DASH eating plan, aim to eat more fresh fruits and vegetables,  whole grains, lean proteins, low-fat dairy, and heart-healthy fats.  Work with your health care provider or diet and nutrition specialist (dietitian) to adjust your eating plan to your individual calorie needs. This information is not intended to replace advice given to you by your health care provider. Make sure you discuss any questions you have with your health care provider. Document Released: 08/22/2011 Document Revised: 08/26/2016 Document Reviewed: 08/26/2016 Elsevier Interactive Patient Education  2017 [employer].

## 2017-05-09 NOTE — Progress Notes (Signed)
Subjective:    Patient ID: Daniel Joseph, male    DOB: Oct 23, 1960, 56 y.o.   MRN: [id number] Chief Complaint  Patient presents with  . Hypertension    Hypertension  This is a chronic problem. The current episode started more than 1 year ago. Pertinent negatives include no anxiety, blurred vision, chest pain, headaches, neck pain, orthopnea, palpitations, PND, shortness of breath or sweats. Risk factors for coronary artery disease include obesity, sedentary lifestyle and smoking/tobacco exposure. Past treatments include beta blockers. There is no history of angina, kidney disease, CAD/MI, CVA, heart failure or left ventricular hypertrophy. There is no history of sleep apnea or a thyroid problem.   Past Medical History:  Diagnosis Date  . Benign essential HTN 01/19/2014   Renal Artery [place] 6/16:  No RAS, No AAA  . Cardiomyopathy, dilated, nonischemic (HCC)    EF 45-50% bye echo 08/2015  . Chronic combined systolic and diastolic CHF (congestive heart failure) ([place]) 01/21/2014  . Gout   . Mitral valve regurgitation    Moderate MR by echo 2018  . PVC's (premature ventricular contractions)    nonsustained VT as well as PVCs - no ICD indicated due to EF 40-45%  . 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 Marital status: Single    Spouse name: N/A  . Number of children: N/A  . Years of education: N/A   Occupational History  . Not on file.   Social History Main Topics  . Smoking status: Never Smoker  . Smokeless tobacco: Never Used  . Alcohol use No     Comment: social  . Drug use: No  . Sexual activity: Not on file   Other Topics Concern  . Not on file   Social History Narrative  . No narrative on file   Immunization History  Administered Date(s) Administered  . Influenza,inj,Quad PF,6+ Mos 06/16/2014, 07/01/2016  . Tdap 02/10/2014  No Known Allergies  Review of Systems  Constitutional: Positive for fatigue. Negative for  fever.  HENT: Negative.   Eyes: Negative for blurred vision.  Respiratory: Negative.  Negative for shortness of breath.   Cardiovascular: Negative.  Negative for chest pain, palpitations, orthopnea, leg swelling and PND.  Gastrointestinal: Negative.   Endocrine: Negative for polydipsia, polyphagia and polyuria.  Genitourinary: Negative.   Musculoskeletal: Negative.  Negative for neck pain.  Skin: Negative.   Allergic/Immunologic: Negative.   Neurological: Negative.  Negative for headaches.  Hematological: Negative.   Psychiatric/Behavioral: Negative.        Objective:   Physical Exam  Constitutional: He is oriented to person, place, and time.  Neck: Normal carotid pulses, no hepatojugular reflux and no JVD present. Carotid bruit is not present. No thyroid mass present.  Cardiovascular: Normal rate, regular rhythm, S1 normal and S2 normal.   No murmur heard. Pulses:      Carotid pulses are 2+ on the right side, and 2+ on the left side.      Radial pulses are 2+ on the right side, and 2+ on the left side.       Femoral pulses are 2+ on the right side, and 2+ on the left side.      Popliteal pulses are 2+ on the right side, and 2+ on the left side.       Dorsalis pedis pulses are 2+ on the right side, and 2+ on the left side.  Posterior tibial pulses are 2+ on the right side, and 2+ on the left side.  Pulmonary/Chest: Effort normal and breath sounds normal.  Abdominal: Soft. Bowel sounds are normal.  Musculoskeletal: Normal range of motion.  Neurological: He is alert and oriented to person, place, and time. He has normal reflexes.  Skin: Skin is warm and dry.  Psychiatric: He has a normal mood and affect. His behavior is normal. Judgment normal.     BP (!) 148/86 (BP Location: Left Arm, Patient Position: Sitting, Cuff Size: Large) Comment: manually  Pulse 69   Temp 97.8 F (36.6 C) (Oral)   Resp 16   Ht 6\' 1"  (1.854 m)   Wt 229 lb (103.9 kg)   SpO2 100%   BMI 30.21  kg/m  Assessment & Plan:  1. 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  All medications were sent to [facility] on 05/05/2017 He does not warrant any medication changes on today.  He is to follow up in cardiology as scheduled - POCT urinalysis dip (device)   2. CKD (chronic kidney disease), stage III Previous GFR was stable at 63. Will maintain adequate blood pressure control.  - POCT urinalysis dip (device) - Basic Metabolic Panel  3. Arthritis, gouty - allopurinol (ZYLOPRIM) 100 MG tablet; Take 1 tablet (100 mg total) by mouth 2 (two) times daily.  Dispense: 60 tablet; Refill: 5 - Uric Acid   RTC: 3 months for hypertension  Donia Pounds  MSN, FNP-C [facility] [address] [place], [place] [address] [phone]

## 2017-05-10 LAB — BASIC METABOLIC PANEL
BUN: 20 mg/dL (ref 7–25)
CALCIUM: 9.2 mg/dL (ref 8.6–10.3)
CO2: 21 mmol/L (ref 20–32)
CREATININE: 1.32 mg/dL (ref 0.70–1.33)
Chloride: 104 mmol/L (ref 98–110)
GLUCOSE: 91 mg/dL (ref 65–99)
Potassium: 3.7 mmol/L (ref 3.5–5.3)
Sodium: 139 mmol/L (ref 135–146)

## 2017-05-10 LAB — URIC ACID: URIC ACID, SERUM: 8.9 mg/dL — AB (ref 4.0–8.0)

## 2017-05-12 ENCOUNTER — Telehealth: Payer: Self-pay

## 2017-05-12 NOTE — Telephone Encounter (Signed)
Called, no answer. Left a message advising patient that uric acid remains elevated and to take allopurinol consistently as prescribed. Advised that refill was sent into pharmacy. Advised to eat low purine diet and refrain from alcohol use. Asked to keep next scheduled appointment or to call back if needed sooner. Callback number was left. Thanks!

## 2017-05-12 NOTE — Telephone Encounter (Signed)
-----   Message from Dorena Dew, [place] sent at 05/11/2017  2:08 PM EDT ----- Regarding: lab results Please inform Daniel Joseph that uric acid remains elevated. Take allopurinol consistently as prescribed. Allopurinol was sent to pharmacy on 05/09/2017. Continue low purine diet and refrain from alcohol use. Will follow up in office as scheduled   Thanksba ----- Message ----- From: Interface, Lab In [facility] [place] [facility] Sent: 05/10/2017   5:30 AM To: Dorena Dew, FNP

## 2017-05-14 ENCOUNTER — Encounter: Payer: Self-pay | Admitting: Cardiology

## 2017-05-14 ENCOUNTER — Ambulatory Visit (INDEPENDENT_AMBULATORY_CARE_PROVIDER_SITE_OTHER): Payer: Self-pay | Admitting: Cardiology

## 2017-05-14 VITALS — BP 152/84 | HR 76 | Ht 73.0 in | Wt 233.8 lb

## 2017-05-14 DIAGNOSIS — I340 Nonrheumatic mitral (valve) insufficiency: Secondary | ICD-10-CM

## 2017-05-14 DIAGNOSIS — I472 Ventricular tachycardia: Secondary | ICD-10-CM

## 2017-05-14 DIAGNOSIS — I119 Hypertensive heart disease without heart failure: Secondary | ICD-10-CM

## 2017-05-14 DIAGNOSIS — I079 Rheumatic tricuspid valve disease, unspecified: Secondary | ICD-10-CM

## 2017-05-14 DIAGNOSIS — I4729 Other ventricular tachycardia: Secondary | ICD-10-CM

## 2017-05-14 DIAGNOSIS — I43 Cardiomyopathy in diseases classified elsewhere: Secondary | ICD-10-CM

## 2017-05-14 DIAGNOSIS — I10 Essential (primary) hypertension: Secondary | ICD-10-CM

## 2017-05-14 DIAGNOSIS — I5042 Chronic combined systolic (congestive) and diastolic (congestive) heart failure: Secondary | ICD-10-CM

## 2017-05-14 DIAGNOSIS — I078 Other rheumatic tricuspid valve diseases: Secondary | ICD-10-CM

## 2017-05-14 DIAGNOSIS — I493 Ventricular premature depolarization: Secondary | ICD-10-CM

## 2017-05-14 MED ORDER — SPIRONOLACTONE 25 MG PO TABS: 25.0000 mg | ORAL_TABLET | Freq: Every day | ORAL | 11 refills | Status: DC

## 2017-05-14 MED FILL — ?SPIRONOLACTONE 25 MG TABLE: 25 | 30 days supply | Qty: 30 | Fill #0

## 2017-05-14 NOTE — Progress Notes (Signed)
Cardiology Office Note:    Date:  05/20/2017   ID:  Daniel Joseph, DOB 06/07/1961, MRN [id number]  PCP:  Dorena Dew, FNP  Cardiologist:  Fransico Him, MD   Referring MD: Dorena Dew, FNP   Chief Complaint  Patient presents with  . Cardiomyopathy  . Hypertension  . Congestive Heart Failure    History of Present Illness:    Daniel Joseph is a 56 y.o. 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 EF 35-40% and grade 2 diastolic dysfunction on echo. 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 Event monitor in 3/16 demonstrated sinus rhythm, sinus tachycardia, PVCs and a run of wide-complex tachycardia. Holter monitor was arranged as well as Lexiscan Myoview. Nuclear study was done and demonstrated defect in the inferoapical region that was felt to represent possible scar but echo showed no distinct wall motion abnormality just diffuse HK, no ischemia on nuclear study and  EF 30-44%. Patient was evaluated by Dr. Lovena Le 01/26/15 for nonsustained ventricular tachycardia. ICD or EP study was not recommended. Repeat echo 08/2015 with EF 45-50%.  He is here today for followup and is doing well except for having problems now with sciatica.  He denies any chest pain or pressure.  He denies any SOB or DOE unless he is out working in extreme heat.  He denies any orthopnea or PND.  He occasionally will notice his heart beat fast if he is doing strenuous work but never irregular. He denies syncope or dizziness.  He has not had any recent problems with LE edema.    Past Medical History:  Diagnosis Date  . Benign essential HTN 01/19/2014   Renal Artery [place] 6/16:  No RAS, No AAA  . Cardiomyopathy, dilated, nonischemic (HCC)    EF 45-50% bye echo 08/2015  . Chronic combined systolic and diastolic CHF (congestive heart failure) ([place])  01/21/2014  . Gout   . Mitral valve regurgitation    Moderate MR by echo 2018  . PVC's (premature ventricular contractions)    nonsustained VT as well as PVCs - no ICD indicated due to EF 40-45%  . 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 ABDOMINAL SURGERY  at birth  . TEE WITHOUT CARDIOVERSION N/A 01/25/2014   Procedure: TRANSESOPHAGEAL ECHOCARDIOGRAM (TEE);  Surgeon: [name], MD;  Location: [place];  Service: Cardiovascular;  Laterality: N/A;  . TEE WITHOUT CARDIOVERSION N/A 09/14/2014   Procedure: TRANSESOPHAGEAL ECHOCARDIOGRAM (TEE);  Surgeon: Sueanne Margarita, MD;  Location: [facility] ENDOSCOPY;  Service: Cardiovascular;  Laterality: N/A;    Current Medications: Current Meds  Medication Sig  . allopurinol (ZYLOPRIM) 100 MG tablet Take 1 tablet (100 mg total) by mouth 2 (two) times daily.  Marland Kitchen amLODipine (NORVASC) 10 MG tablet Take 1 tablet (10 mg total) by mouth daily.  Marland Kitchen aspirin EC 81 MG tablet Take 1 tablet (81 mg total) by mouth daily.  . benazepril (LOTENSIN) 40 MG tablet Take 1 tablet (40 mg total) by mouth daily.  . cetirizine (ZYRTEC) 10 MG tablet Take 1 tablet (10 mg total) by mouth daily.  . cloNIDine (CATAPRES) 0.2 MG tablet Take 1 tablet (0.2 mg total) by mouth daily.  . cyclobenzaprine (FLEXERIL) 5 MG tablet Take 1 tablet (5 mg total) by mouth 3 (three) times daily as  needed for muscle spasms.  . Diclofenac Sodium 3 % GEL Place 1 application onto the skin 4 (four) times daily as needed.  . doxazosin (CARDURA) 4 MG tablet Take 1.5 tablets (6 mg total) by mouth at bedtime.  . furosemide (LASIX) 40 MG tablet Take 1 tablet (40 mg total) by mouth daily.  Marland Kitchen gabapentin (NEURONTIN) 400 MG capsule Take 1 capsule (400 mg total) by mouth 4 (four) times daily.  . hydrALAZINE (APRESOLINE) 50 MG tablet TAKE 2 TABLETS BY MOUTH 3 TIMES DAILY  . isosorbide mononitrate (IMDUR) 30 MG 24 hr tablet Take 1 tablet (30 mg  total) by mouth daily.  Marland Kitchen labetalol (NORMODYNE) 200 MG tablet Take 1 tablet (200 mg total) by mouth 2 (two) times daily.  Marland Kitchen spironolactone (ALDACTONE) 25 MG tablet Take 1 tablet (25 mg total) by mouth daily.  . traMADol (ULTRAM) 50 MG tablet Take 1 tablet (50 mg total) by mouth every 6 (six) hours as needed.  . [DISCONTINUED] spironolactone (ALDACTONE) 25 MG tablet Take 0.5 tablets (12.5 mg total) by mouth daily.     Allergies:   Patient has no known allergies.   Social History   Social History  . Marital status: Single    Spouse name: N/A  . Number of children: N/A  . Years of education: N/A   Social History Main Topics  . Smoking status: Never Smoker  . Smokeless tobacco: Never Used  . Alcohol use No     Comment: social  . Drug use: No  . Sexual activity: Not Asked   Other Topics Concern  . None   Social History Narrative  . None     Family History: The patient's family history includes Heart Problems in his brother, father, and mother; Heart attack in his brother and father; Heart failure in his mother; Hypertension in his brother, father, and mother; Stroke in his mother.  ROS:   Please see the history of present illness.     All other systems reviewed and are negative.  EKGs/Labs/Other Studies Reviewed:    The following studies were reviewed today: none  EKG:  EKG is  ordered today.  The ekg ordered today demonstrates NSR at 76bpm with LAD, LVH with repol abnormality   Recent Labs: 07/01/2016: Hemoglobin 14.9; Platelets 174 10/10/2016: Magnesium 2.0; NT-Pro BNP 360 01/07/2017: ALT 18 05/09/2017: BUN 20; Creat 1.32; Potassium 3.7; Sodium 139   Recent Lipid Panel    Component Value Date/Time   CHOL 175 07/01/2016 1359   TRIG 281 (H) 07/01/2016 1359   HDL 37 (L) 07/01/2016 1359   CHOLHDL 4.7 07/01/2016 1359   VLDL 56 (H) 07/01/2016 1359   LDLCALC 82 07/01/2016 1359    Physical Exam:    VS:  BP (!) 152/84   Pulse 76   Ht 6\' 1"  (1.854 m)   Wt 233 lb  12.8 oz (106.1 kg)   BMI 30.85 kg/m     Wt Readings from Last 3 Encounters:  05/14/17 233 lb 12.8 oz (106.1 kg)  05/09/17 229 lb (103.9 kg)  05/05/17 230 lb (104.3 kg)     GEN:  Well nourished, well developed in no acute distress HEENT: Normal NECK: No JVD; No carotid bruits LYMPHATICS: No lymphadenopathy CARDIAC: RRR, no murmurs, rubs, gallops RESPIRATORY:  Clear to auscultation without rales, wheezing or rhonchi  ABDOMEN: Soft, non-tender, non-distended MUSCULOSKELETAL:  No edema; No deformity  SKIN: Warm and dry NEUROLOGIC:  Alert and oriented x 3 PSYCHIATRIC:  Normal affect   ASSESSMENT:  1. Chronic combined systolic and diastolic CHF (congestive heart failure) ([place])   2. Cardiomyopathy due to hypertension, without heart failure ([place])   3. Essential hypertension   4. Non-rheumatic mitral regurgitation   5. Tricuspid valve mass    PLAN:    In order of problems listed above:  1.  Chronic combined systolic and diastolic CHF secondary to HTN.  He appears euvolemic on exam today and weight is stable. He will continue Benazepril 40mg  daily, Hydralazine 50mg  TID, Imdur 30mg  daily and aldactone 25mg  daily.  He will also continue on Lasix 40mg  daily. I will check a BMET.   2.  Hypertensive DCM - EF 45% by echo 10/2016.    3.  Nonsustained VT evaluated by EP - no ICD given EF > 35%.  4.  HTN - BP controlled on current meds.  Continue labetolol 200mg  BID, Hydralazine 100mg  TID, aldactone 25mg  daily, doxazosin 6mg  daily, clonidine 0.1mg  daily, amlodipine 10mg  daily and Lotensin 40mg  daily.  Renal artery duplex in the past was normal.   5.  MVP with moderate MR by echo 10/2016 - repeat echo 10/2017 for stability                                 .  6.  PVC's - these are stable on BB.  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    Medication Adjustments/Labs and Tests Ordered: Current medicines are reviewed at length with the patient today.  Concerns regarding medicines are  outlined above.  Orders Placed This Encounter  Procedures  . Basic metabolic panel  . EKG 12-Lead   Meds ordered this encounter  Medications  . spironolactone (ALDACTONE) 25 MG tablet    Sig: Take 1 tablet (25 mg total) by mouth daily.    Dispense:  30 tablet    Refill:  11    Signed, Fransico Him, MD  05/20/2017 1:39 PM    [facility]

## 2017-05-14 NOTE — Patient Instructions (Signed)
Medication Instructions:  1) INCREASE ALDACTONE to 25 mg daily  Labwork: You will have labs drawn at your appointment on 9/12.  Testing/Procedures: None  Follow-Up: You have an appointment in the HTN CLINIC 05/28/17 at 11:30AM.  Your provider wants you to follow-up in: 6 months with Dr. [name]. You will receive a reminder letter in the mail two months in advance. If you don't receive a letter, please call our office to schedule the follow-up appointment.    Any Other Special Instructions Will Be Listed Below (If Applicable).     If you need a refill on your cardiac medications before your next appointment, please call your pharmacy.

## 2017-05-26 MED FILL — ALLOPURINOL 100 MG TABLET: 100 | 30 days supply | Qty: 60 | Fill #0

## 2017-05-28 ENCOUNTER — Other Ambulatory Visit: Payer: No Typology Code available for payment source | Admitting: *Deleted

## 2017-05-28 ENCOUNTER — Ambulatory Visit (INDEPENDENT_AMBULATORY_CARE_PROVIDER_SITE_OTHER): Payer: No Typology Code available for payment source | Admitting: Pharmacist

## 2017-05-28 VITALS — BP 154/84 | HR 66

## 2017-05-28 DIAGNOSIS — I10 Essential (primary) hypertension: Secondary | ICD-10-CM

## 2017-05-28 DIAGNOSIS — I5042 Chronic combined systolic (congestive) and diastolic (congestive) heart failure: Secondary | ICD-10-CM

## 2017-05-28 LAB — BASIC METABOLIC PANEL
BUN / CREAT RATIO: 11 (ref 9–20)
BUN: 17 mg/dL (ref 6–24)
CO2: 24 mmol/L (ref 20–29)
CREATININE: 1.49 mg/dL — AB (ref 0.76–1.27)
Calcium: 8.9 mg/dL (ref 8.7–10.2)
Chloride: 99 mmol/L (ref 96–106)
GFR, EST AFRICAN AMERICAN: 61 mL/min/{1.73_m2} (ref >59)
GFR, EST NON AFRICAN AMERICAN: 53 mL/min/{1.73_m2} — AB (ref >59)
GLUCOSE: 83 mg/dL (ref 65–99)
Potassium: 3.6 mmol/L (ref 3.5–5.2)
SODIUM: 138 mmol/L (ref 134–144)

## 2017-05-28 NOTE — Patient Instructions (Addendum)
Thank you for coming to see [place] today!  Your blood pressure was above goal of <130/80.  Your blood pressure in clinic today was 154/84.   We will call you tomorrow with your lab results and with a recommendation for your blood pressure medications.   Please check when you get home to see if you have been taking your labetalol once daily or twice daily.   Consider purchasing a pill box to help better medication timing. When purchasing groceries try to buy low sodium products and aim for less than <2g of sodium per day.   Thank you!

## 2017-05-28 NOTE — Progress Notes (Signed)
Patient ID: Daniel Joseph                 DOB: 02/20/1961                      MRN: [id number]     HPI: Daniel Joseph is a 56 y.o. male referred by Dr. [name] to HTN clinic. 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 EF 35-40% and grade 2 diastolic dysfunction on echo. At most recent visit on 05/14/17 pts BP was above goal of <130/61mmHg at 152/65mmHg, HR of 76bpm.  Spironolactone was increased from 12.5mg  daily to 25mg  daily.   Presents to HTN clinic today for f/u.  Pt is in good spirits but is having quite a bit of pain with a recent gout flare.  Pt states he has been taking ibuprofen twice daily to help with the pain. He has not experienced any shortness of breath or chest pains.  He states he feels dizzy or lightheaded when working outside in the hot sun.  This also causes him to have headaches, and he feels he is most likely dehydrated. Pt states he does a good job staying compliant with medications and maybe misses doses 1-2 times per week. Of note, he cannot remember if he takes labetalol once or twice daily. Pt's BP in clinic today was elevated at 154/63mmHg.    Current HTN meds: amlodipine 10mg  daily, benazepril 40mg  daily, clonidine 0.2mg  daily, doxazosin 6mg  at bedtime, furosemide 40mg  daily, hydralazine 100mg  TID, isosorbide mononitrate 30mg  daily, labetalol 200mg  BID, spironolactone 25mg  daily.  BP goal: <130/66mmHg  Family History: The patient's family history includes Heart Problems in his brother, father, and mother; Heart attack in his brother and father; Heart failure in his mother; Hypertension in his brother, father, and mother; Stroke in his mother.  Social History: Does not smoke, drinks alcohol socially. Denies drug use.   Diet: Pt states he is adhering to a low salt diet, and all can goods that he buys are low salt or salt free. He does not drink coffee or  sodas.  Mostly drinks gingerale, gatorade, water, and sweet tea.   Exercise: Pt is currently doing physical therapy at [facility]: mainly squats and bending exercises.  He also walks around the track every morning.   Home BP readings: 150s/90s  Wt Readings from Last 3 Encounters:  05/14/17 233 lb 12.8 oz (106.1 kg)  05/09/17 229 lb (103.9 kg)  05/05/17 230 lb (104.3 kg)   BP Readings from Last 3 Encounters:  05/14/17 (!) 152/84  05/09/17 (!) 148/86  05/05/17 (!) 188/112   Pulse Readings from Last 3 Encounters:  05/14/17 76  05/09/17 69  05/05/17 74    Renal function: Estimated Creatinine Clearance: 82.8 mL/min (by C-G formula based on SCr of 1.32 mg/dL).  Past Medical History:  Diagnosis Date  . Benign essential HTN 01/19/2014   Renal Artery [place] 6/16:  No RAS, No AAA  . Cardiomyopathy, dilated, nonischemic (HCC)    EF 45-50% bye echo 08/2015  . Chronic combined systolic and diastolic CHF (congestive heart failure) ([place]) 01/21/2014  . Gout   . Mitral valve regurgitation    Moderate MR by echo 2018  . PVC's (premature ventricular contractions)    nonsustained VT as well as PVCs - no ICD indicated due to EF 40-45%  . 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 allopurinol (ZYLOPRIM) 100 MG tablet Take 1 tablet (100 mg total) by mouth 2 (two) times daily. 60 tablet 5  . amLODipine (NORVASC) 10 MG tablet Take 1 tablet (10 mg total) by mouth daily. 30 tablet 4  . aspirin EC 81 MG tablet Take 1 tablet (81 mg total) by mouth daily. 90 tablet 3  . benazepril (LOTENSIN) 40 MG tablet Take 1 tablet (40 mg total) by mouth daily. 30 tablet 6  . cetirizine (ZYRTEC) 10 MG tablet Take 1 tablet (10 mg total) by mouth daily. 30 tablet 11  . cloNIDine (CATAPRES) 0.2 MG tablet Take 1 tablet (0.2 mg total) by mouth daily. 90 tablet 3  . cyclobenzaprine (FLEXERIL) 5 MG tablet Take 1 tablet (5 mg  total) by mouth 3 (three) times daily as needed for muscle spasms. 30 tablet 0  . Diclofenac Sodium 3 % GEL Place 1 application onto the skin 4 (four) times daily as needed. 100 g 5  . doxazosin (CARDURA) 4 MG tablet Take 1.5 tablets (6 mg total) by mouth at bedtime. 45 tablet 2  . furosemide (LASIX) 40 MG tablet Take 1 tablet (40 mg total) by mouth daily. 30 tablet 6  . gabapentin (NEURONTIN) 400 MG capsule Take 1 capsule (400 mg total) by mouth 4 (four) times daily. 120 capsule 2  . hydrALAZINE (APRESOLINE) 50 MG tablet TAKE 2 TABLETS BY MOUTH 3 TIMES DAILY 180 tablet 2  . isosorbide mononitrate (IMDUR) 30 MG 24 hr tablet Take 1 tablet (30 mg total) by mouth daily. 30 tablet 2  . labetalol (NORMODYNE) 200 MG tablet Take 1 tablet (200 mg total) by mouth 2 (two) times daily. 180 tablet 3  . spironolactone (ALDACTONE) 25 MG tablet Take 1 tablet (25 mg total) by mouth daily. 30 tablet 11  . traMADol (ULTRAM) 50 MG tablet Take 1 tablet (50 mg total) by mouth every 6 (six) hours as needed. 30 tablet 0   No current facility-administered medications on file prior to visit.     No Known Allergies   Assessment/Plan:   1. Resistant hypertension: Pt's BP was above goal of <130/90 at 154/73mmHg. Continue all BP medications including amlodipine 10mg  daily, benazepril 40mg  daily, clonidine 0.2mg  daily, doxazosin 6mg  at bedtime, furosemide 40mg  daily, hydralazine 100mg  TID, isosorbide mononitrate 30mg  daily, labetalol 200mg  BID, spironolactone 25mg  daily.  We will f/u with pt once BMET is back and if SCr and K+ are stable will increase spironolactone to 50mg  daily.  Pt was unsure of labetalol dose being once daily or twice daily and will check at home and f/u with [place].  Encouraged pt to purchase a pill box to help with medication compliance, to eat a diet low in sodium, and to continue exercising.  Will plan to f/u in clinic in 1-2 weeks pending BMET results.  Pt seen with pharmacy student, Maple Mirza and  Ulanda Edison, PGY1 resident.   Megan E. Supple, PharmD, CPP, [facility] [address] [place], [place] [address] Phone: [phone]; Fax: [phone] 05/28/2017 12:39 PM

## 2017-05-29 ENCOUNTER — Telehealth: Payer: Self-pay | Admitting: Cardiology

## 2017-05-29 NOTE — Telephone Encounter (Signed)
New message    Pt is calling asking for a call back. He said he was here yesterday and is supposed to call about his medication. Please call.

## 2017-05-29 NOTE — Telephone Encounter (Signed)
LMOM for pt to return call. 

## 2017-05-30 NOTE — Telephone Encounter (Signed)
LMOM; patient to call back with concerns

## 2017-06-03 MED ORDER — SPIRONOLACTONE 50 MG PO TABS: 50.0000 mg | ORAL_TABLET | Freq: Every day | ORAL | 11 refills | Status: DC

## 2017-06-03 NOTE — Addendum Note (Signed)
Addended by: Bridgett Larsson on: 06/03/2017 04:35 PM   Modules accepted: Orders

## 2017-06-12 NOTE — Progress Notes (Deleted)
Patient ID: Daniel Joseph                 DOB: 05-Nov-1960                      MRN: [id number]     HPI: Daniel Joseph is a 56 y.o. male referred by Dr. [name] to HTN clinic. 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 EF 35-40% and grade 2 diastolic dysfunction on echo. At most recent visit on 05/28/17, pts BP was above goal of <130/33mmHg at 154/4mmHg, and spironolactone was increased to 50mg  daily.   Presents to HTN clinic today for f/u.  Pt is in good spirits but is having quite a bit of pain with a recent gout flare.  Pt states he has been taking ibuprofen twice daily to help with the pain. He has not experienced any shortness of breath or chest pains.  He states he feels dizzy or lightheaded when working outside in the hot sun.  This also causes him to have headaches, and he feels he is most likely dehydrated. Pt states he does a good job staying compliant with medications and maybe misses doses 1-2 times per week. Of note, he cannot remember if he takes labetalol once or twice daily. Pt's BP in clinic today was elevated at 154/9mmHg.    Increase spironolactone (BMET today) or make clonidine BID  Current HTN meds: amlodipine 10mg  daily, benazepril 40mg  daily, clonidine 0.2mg  daily, doxazosin 6mg  at bedtime, furosemide 40mg  daily, hydralazine 100mg  TID, isosorbide mononitrate 30mg  daily, labetalol 200mg  BID, spironolactone 50mg  daily.  BP goal: <130/11mmHg  Family History: The patient's family history includes Heart Problems in his brother, father, and mother; Heart attack in his brother and father; Heart failure in his mother; Hypertension in his brother, father, and mother; Stroke in his mother.  Social History: Does not smoke, drinks alcohol socially. Denies drug use.   Diet: Pt states he is adhering to a low salt diet, and all can goods that he buys are low salt or  salt free. He does not drink coffee or sodas.  Mostly drinks gingerale, gatorade, water, and sweet tea.   Exercise: Pt is currently doing physical therapy at [facility]: mainly squats and bending exercises.  He also walks around the track every morning.   Home BP readings: 150s/90s  Wt Readings from Last 3 Encounters:  05/14/17 233 lb 12.8 oz (106.1 kg)  05/09/17 229 lb (103.9 kg)  05/05/17 230 lb (104.3 kg)   BP Readings from Last 3 Encounters:  05/28/17 (!) 154/84  05/14/17 (!) 152/84  05/09/17 (!) 148/86   Pulse Readings from Last 3 Encounters:  05/28/17 66  05/14/17 76  05/09/17 69    Renal function: CrCl cannot be calculated (Unknown ideal weight.).  Past Medical History:  Diagnosis Date  . Benign essential HTN 01/19/2014   Renal Artery [place] 6/16:  No RAS, No AAA  . Cardiomyopathy, dilated, nonischemic (HCC)    EF 45-50% bye echo 08/2015  . Chronic combined systolic and diastolic CHF (congestive heart failure) ([place]) 01/21/2014  . Gout   . Mitral valve regurgitation    Moderate MR by echo 2018  . PVC's (premature ventricular contractions)    nonsustained VT as well as PVCs - no ICD indicated due to EF 40-45%  . 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 allopurinol (ZYLOPRIM) 100 MG tablet Take 1 tablet (100 mg total) by mouth 2 (two) times daily. 60 tablet 5  . amLODipine (NORVASC) 10 MG tablet Take 1 tablet (10 mg total) by mouth daily. 30 tablet 4  . aspirin EC 81 MG tablet Take 1 tablet (81 mg total) by mouth daily. 90 tablet 3  . benazepril (LOTENSIN) 40 MG tablet Take 1 tablet (40 mg total) by mouth daily. 30 tablet 6  . cetirizine (ZYRTEC) 10 MG tablet Take 1 tablet (10 mg total) by mouth daily. 30 tablet 11  . cloNIDine (CATAPRES) 0.2 MG tablet Take 1 tablet (0.2 mg total) by mouth daily. 90 tablet 3  . doxazosin (CARDURA) 4 MG tablet Take 1.5 tablets (6 mg  total) by mouth at bedtime. 45 tablet 2  . furosemide (LASIX) 40 MG tablet Take 1 tablet (40 mg total) by mouth daily. 30 tablet 6  . hydrALAZINE (APRESOLINE) 50 MG tablet TAKE 2 TABLETS BY MOUTH 3 TIMES DAILY 180 tablet 2  . isosorbide mononitrate (IMDUR) 30 MG 24 hr tablet Take 1 tablet (30 mg total) by mouth daily. 30 tablet 2  . labetalol (NORMODYNE) 200 MG tablet Take 1 tablet (200 mg total) by mouth 2 (two) times daily. 180 tablet 3  . spironolactone (ALDACTONE) 50 MG tablet Take 1 tablet (50 mg total) by mouth daily. 30 tablet 11   No current facility-administered medications on file prior to visit.     No Known Allergies   Assessment/Plan:

## 2017-06-13 ENCOUNTER — Other Ambulatory Visit: Payer: No Typology Code available for payment source

## 2017-06-13 ENCOUNTER — Ambulatory Visit: Payer: No Typology Code available for payment source | Admitting: Pharmacist

## 2017-06-26 ENCOUNTER — Other Ambulatory Visit: Payer: Self-pay | Admitting: Family Medicine

## 2017-06-26 ENCOUNTER — Telehealth: Payer: Self-pay

## 2017-06-26 MED ORDER — GABAPENTIN 300 MG PO CAPS: 300.0000 mg | ORAL_CAPSULE | Freq: Three times a day (TID) | ORAL | 3 refills | Status: DC

## 2017-06-26 MED FILL — DICLOFENAC SODIUM 3% GEL: 3 | 25 days supply | Qty: 100 | Fill #1

## 2017-06-26 MED FILL — GABAPENTIN 300 MG CAPSULE: 300 | 30 days supply | Qty: 90 | Fill #0

## 2017-06-26 NOTE — Telephone Encounter (Signed)
Patient is asking for a refill on gabapentin but this is not on current medication list. Please advise. Thanks!

## 2017-06-27 ENCOUNTER — Ambulatory Visit (INDEPENDENT_AMBULATORY_CARE_PROVIDER_SITE_OTHER): Payer: No Typology Code available for payment source | Admitting: Pharmacist

## 2017-06-27 ENCOUNTER — Other Ambulatory Visit: Payer: No Typology Code available for payment source | Admitting: *Deleted

## 2017-06-27 VITALS — BP 128/88 | HR 58 | Wt 223.2 lb

## 2017-06-27 DIAGNOSIS — I10 Essential (primary) hypertension: Secondary | ICD-10-CM

## 2017-06-27 NOTE — Assessment & Plan Note (Signed)
Blood pressure controlled today during follow up appointment. Patient reports occasional SBP in 150s when in pain, but no records from home BP readings are available for assessment. Noted weight loss of 10lb since 05/14/2017.  BMET repeated done today after spironolactone dose was increased during last appointment. Will continue current therapy without changes, call patient with BMET results, and follow up with HTN clinic as needed. Patient was encouraged to call clinic if BP persistently > 150.

## 2017-06-27 NOTE — Progress Notes (Signed)
Patient ID: Daniel Joseph                 DOB: [birth year]                      MRN: [id number]     HPI: KHALED HERDA is a 56 y.o. male referred by Dr. [name] to HTN clinic. 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 EF 35-40% and grade 2 diastolic dysfunction on echo. At most recent HTN clinic f/u on 05/28/2017 his blood pressure was above desired goal and spironolactone was increased from 25mg  to 50mg  daily. Will repeat BMET today to monitor renal function and electrolytes.Patient present to HTN clinic today for follow up. Denies dizziness, headaches, chest pain or swelling.  Reports occasional SBP 150s when in pain. Her confirmed is taking labetalol twice daily and denies ADR to current therapy.  Current HTN meds: amlodipine 10mg  daily benazepril 40mg  daily clonidine 0.2mg  daily  doxazosin 6mg  at bedtime furosemide 40mg  daily hydralazine 100mg  three times daily isosorbide mononitrate 30mg  daily labetalol 200mg  twice daily spironolactone 50mg  daily   BP goal: <130/65mmHg  Family History: The patient's family history includes Heart Problems in his brother, father, and mother; Heart attack in his brother and father; Heart failure in his mother; Hypertension in his brother, father, and mother; Stroke in his mother.  Social History: Does not smoke, drinks alcohol socially. Denies drug use.   Diet: Pt states he is adhering to a low salt diet, and all can goods that he buys are low salt or salt free. He does not drink coffee or sodas.  Mostly drinks gingerale, gatorade, water, and sweet tea.   Exercise: Pt is currently doing physical therapy at [facility]: mainly squats and bending exercises.  He also walks around the track every morning   Home BP readings: none available  Wt Readings from Last 3 Encounters:  06/27/17 223 lb 3.2 oz (101.2 kg)  05/14/17 233 lb 12.8 oz  (106.1 kg)  05/09/17 229 lb (103.9 kg)   BP Readings from Last 3 Encounters:  06/27/17 128/88  05/28/17 (!) 154/84  05/14/17 (!) 152/84   Pulse Readings from Last 3 Encounters:  06/27/17 (!) 58  05/28/17 66  05/14/17 76    Past Medical History:  Diagnosis Date  . Benign essential HTN 01/19/2014   Renal Artery [place] 6/16:  No RAS, No AAA  . Cardiomyopathy, dilated, nonischemic (HCC)    EF 45-50% bye echo 08/2015  . Chronic combined systolic and diastolic CHF (congestive heart failure) ([place]) 01/21/2014  . Gout   . Mitral valve regurgitation    Moderate MR by echo 2018  . PVC's (premature ventricular contractions)    nonsustained VT as well as PVCs - no ICD indicated due to EF 40-45%  . 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 allopurinol (ZYLOPRIM) 100 MG tablet Take 1 tablet (100 mg total) by mouth 2 (two) times daily. 60 tablet 5  . amLODipine (NORVASC) 10 MG tablet Take 1 tablet (10 mg total) by mouth daily. 30 tablet 4  . aspirin EC 81 MG tablet Take 1 tablet (81 mg total) by mouth daily. 90 tablet 3  . benazepril (LOTENSIN) 40 MG tablet Take 1 tablet (40 mg total) by mouth daily. 30 tablet 6  . cetirizine (  ZYRTEC) 10 MG tablet Take 1 tablet (10 mg total) by mouth daily. 30 tablet 11  . cloNIDine (CATAPRES) 0.2 MG tablet Take 1 tablet (0.2 mg total) by mouth daily. 90 tablet 3  . doxazosin (CARDURA) 4 MG tablet Take 1.5 tablets (6 mg total) by mouth at bedtime. 45 tablet 2  . furosemide (LASIX) 40 MG tablet Take 1 tablet (40 mg total) by mouth daily. 30 tablet 6  . gabapentin (NEURONTIN) 300 MG capsule Take 1 capsule (300 mg total) by mouth 3 (three) times daily. 90 capsule 3  . hydrALAZINE (APRESOLINE) 50 MG tablet TAKE 2 TABLETS BY MOUTH 3 TIMES DAILY 180 tablet 2  . isosorbide mononitrate (IMDUR) 30 MG 24 hr tablet Take 1 tablet (30 mg total) by mouth daily. 30 tablet 2  .  labetalol (NORMODYNE) 200 MG tablet Take 1 tablet (200 mg total) by mouth 2 (two) times daily. 180 tablet 3  . spironolactone (ALDACTONE) 50 MG tablet Take 1 tablet (50 mg total) by mouth daily. 30 tablet 11   No current facility-administered medications on file prior to visit.     No Known Allergies  Blood pressure 128/88, pulse (!) 58, weight 223 lb 3.2 oz (101.2 kg).  HTN (hypertension) Blood pressure controlled today during follow up appointment. Patient reports occasional SBP in 150s when in pain, but no records from home BP readings are available for assessment. Noted weight loss of 10lb since 05/14/2017.  BMET repeated done today after spironolactone dose was increased during last appointment. Will continue current therapy without changes, call patient with BMET results, and follow up with HTN clinic as needed. Patient was encouraged to call clinic if BP persistently > 150.  Zaydrian Batta Rodriguez-Guzman PharmD, BCPS, [facility] [address] [place] [address] 06/27/2017 11:22 AM

## 2017-06-27 NOTE — Patient Instructions (Signed)
Return for a  follow up appointment in as needed  Your blood pressure today is 128/88 pulse 58  Check your blood pressure at home daily (if able) and keep record of the readings.  Take your BP meds as follows: **ALL medication as prescribed*  Bring all of your meds, your BP cuff and your record of home blood pressures to your next appointment.  Exercise as you're able, try to walk approximately 30 minutes per day.  Keep salt intake to a minimum, especially watch canned and prepared boxed foods.  Eat more fresh fruits and vegetables and fewer canned items.  Avoid eating in fast food restaurants.    HOW TO TAKE YOUR BLOOD PRESSURE: . Rest 5 minutes before taking your blood pressure. .  Don't smoke or drink caffeinated beverages for at least 30 minutes before. . Take your blood pressure before (not after) you eat. . Sit comfortably with your back supported and both feet on the floor (don't cross your legs). . Elevate your arm to heart level on a table or a desk. . Use the proper sized cuff. It should fit smoothly and snugly around your bare upper arm. There should be enough room to slip a fingertip under the cuff. The bottom edge of the cuff should be 1 inch above the crease of the elbow. . Ideally, take 3 measurements at one sitting and record the average.

## 2017-06-28 LAB — BASIC METABOLIC PANEL
BUN / CREAT RATIO: 9 (ref 9–20)
BUN: 13 mg/dL (ref 6–24)
CHLORIDE: 100 mmol/L (ref 96–106)
CO2: 20 mmol/L (ref 20–29)
Calcium: 9.2 mg/dL (ref 8.7–10.2)
Creatinine, Ser: 1.4 mg/dL — ABNORMAL HIGH (ref 0.76–1.27)
GFR calc Af Amer: 66 mL/min/{1.73_m2} (ref >59)
GFR calc non Af Amer: 57 mL/min/{1.73_m2} — ABNORMAL LOW (ref >59)
GLUCOSE: 97 mg/dL (ref 65–99)
Potassium: 3.7 mmol/L (ref 3.5–5.2)
Sodium: 140 mmol/L (ref 134–144)

## 2017-06-30 ENCOUNTER — Other Ambulatory Visit: Payer: Self-pay | Admitting: Family Medicine

## 2017-06-30 ENCOUNTER — Telehealth: Payer: Self-pay

## 2017-06-30 DIAGNOSIS — N183 Chronic kidney disease, stage 3 unspecified: Secondary | ICD-10-CM

## 2017-06-30 DIAGNOSIS — I10 Essential (primary) hypertension: Secondary | ICD-10-CM

## 2017-06-30 DIAGNOSIS — I5042 Chronic combined systolic (congestive) and diastolic (congestive) heart failure: Secondary | ICD-10-CM

## 2017-06-30 MED ORDER — POTASSIUM CHLORIDE ER 10 MEQ PO TBCR: 10.0000 meq | EXTENDED_RELEASE_TABLET | Freq: Every day | ORAL | 0 refills | Status: DC

## 2017-06-30 MED FILL — POTASSIUM CL 10 MEQ TAB SA: 10 | 30 days supply | Qty: 30 | Fill #0

## 2017-06-30 NOTE — Telephone Encounter (Signed)
spoke with patient about recent lab results. patient verbalized understanding. repeat blet scheudled for monday october 22nd. kdur [id number] script sent to pharmacy.

## 2017-07-03 ENCOUNTER — Ambulatory Visit (INDEPENDENT_AMBULATORY_CARE_PROVIDER_SITE_OTHER): Payer: Self-pay | Admitting: Family Medicine

## 2017-07-03 ENCOUNTER — Encounter: Payer: Self-pay | Admitting: Family Medicine

## 2017-07-03 VITALS — BP 118/77 | HR 64 | Temp 98.4°F | Resp 16 | Ht 73.0 in | Wt 222.0 lb

## 2017-07-03 DIAGNOSIS — G8929 Other chronic pain: Secondary | ICD-10-CM

## 2017-07-03 DIAGNOSIS — M25551 Pain in right hip: Secondary | ICD-10-CM

## 2017-07-03 MED ORDER — TRAMADOL HCL 50 MG PO TABS: 50.0000 mg | ORAL_TABLET | Freq: Three times a day (TID) | ORAL | 0 refills | Status: DC | PRN

## 2017-07-03 MED ORDER — PREDNISONE 20 MG PO TABS: 60.0000 mg | ORAL_TABLET | Freq: Every day | ORAL | 0 refills | Status: AC

## 2017-07-03 MED ORDER — KETOROLAC TROMETHAMINE 60 MG/2ML IM SOLN
60.0000 mg | Freq: Once | INTRAMUSCULAR | Status: AC
  Administered 2017-07-03: 60 mg via INTRAMUSCULAR

## 2017-07-03 MED FILL — ?PREDNISONE 20MG TABLET: 20 | 4 days supply | Qty: 12 | Fill #0

## 2017-07-03 MED FILL — traMADol HCL 50 MG TABS: 50 | 10 days supply | Qty: 30 | Fill #0

## 2017-07-03 NOTE — Patient Instructions (Signed)
Will start a trial of Prednisone for 4 days at 60 mg with breakfast Given a 1 time dose of Toradol 60 mg Refrain from Ibuprofen or other OTC NSAIDS due to nephrotoxicity as discussed Tramadol 50 mg every 8 hours for moderate to severe pain Tylenol 500 mg every 6 hours for mild to moderate pain Apply heating pad to right hip as needed  Hip Pain The hip is the joint between the upper legs and the lower pelvis. The bones, cartilage, tendons, and muscles of your hip joint support your body and allow you to move around. Hip pain can range from a minor ache to severe pain in one or both of your hips. The pain may be felt on the inside of the hip joint near the groin, or the outside near the buttocks and upper thigh. You may also have swelling or stiffness. Follow these instructions at home: Managing pain, stiffness, and swelling  If directed, apply ice to the injured area. ? Put ice in a plastic bag. ? Place a towel between your skin and the bag. ? Leave the ice on for 20 minutes, 2-3 times a day  Sleep with a pillow between your legs on your most comfortable side.  Avoid any activities that cause pain. General instructions  Take over-the-counter and prescription medicines only as told by your health care provider.  Do any exercises as told by your health care provider.  Record the following: ? How often you have hip pain. ? The location of your pain. ? What the pain feels like. ? What makes the pain worse.  Keep all follow-up visits as told by your health care provider. This is important. Contact a health care provider if:  You cannot put weight on your leg.  Your pain or swelling continues or gets worse after one week.  It gets harder to walk.  You have a fever. Get help right away if:  You fall.  You have a sudden increase in pain and swelling in your hip.  Your hip is red or swollen or very tender to touch. Summary  Hip pain can range from a minor ache to severe pain  in one or both of your hips.  The pain may be felt on the inside of the hip joint near the groin, or the outside near the buttocks and upper thigh.  Avoid any activities that cause pain.  Record how often you have hip pain, the location of the pain, what makes it worse and what it feels like. This information is not intended to replace advice given to you by your health care provider. Make sure you discuss any questions you have with your health care provider. Document Released: 02/20/2010 Document Revised: 08/05/2016 Document Reviewed: 08/05/2016 Elsevier Interactive Patient Education  2017 [employer].

## 2017-07-03 NOTE — Progress Notes (Signed)
Subjective:    Daniel Joseph is a 56 y.o. male who presents with right hip pain. Onset of the symptoms was several weeks ago. Aggravated by prolonged sitting and laying down.  The patient reports the hip pain is worse with weight bearing, is aggravated by walking and radiates to right leg. Aggravating symptoms include: any weight bearing, going up and down stairs, inactivity, kneeling, lateral movements and rising after sitting. Patient has had prior hip problems. Previous visits for this problem: multiple, this is a longstanding diagnosis. Last seen several months ago by me.  Past Medical History:  Diagnosis Date  . Benign essential HTN 01/19/2014   Renal Artery [place] 6/16:  No RAS, No AAA  . Cardiomyopathy, dilated, nonischemic (HCC)    EF 45-50% bye echo 08/2015  . Chronic combined systolic and diastolic CHF (congestive heart failure) ([place]) 01/21/2014  . Gout   . Mitral valve regurgitation    Moderate MR by echo 2018  . PVC's (premature ventricular contractions)    nonsustained VT as well as PVCs - no ICD indicated due to EF 40-45%  . 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 Marital status: Single    Spouse name: N/A  . Number of children: N/A  . Years of education: N/A   Occupational History  . Not on file.   Social History Main Topics  . Smoking status: Never Smoker  . Smokeless tobacco: Never Used  . Alcohol use No     Comment: social  . Drug use: No  . Sexual activity: Not on file   Other Topics Concern  . Not on file   Social History Narrative  . No narrative on file   Immunization History  Administered Date(s) Administered  . Influenza,inj,Quad PF,6+ Mos 06/16/2014, 07/01/2016  . Tdap 02/10/2014   No Known Allergies Review of Systems  Constitutional: Negative.   Eyes: Negative.   Respiratory: Negative.   Cardiovascular: Negative.  Negative for chest pain and palpitations.  Gastrointestinal: Negative.    Musculoskeletal: Positive for joint pain (right hip pain).  Neurological: Negative.   Psychiatric/Behavioral: Negative.      Objective:  Physical Exam  Constitutional: He is well-developed, well-nourished, and in no distress.  Cardiovascular: Normal rate, regular rhythm, normal heart sounds and intact distal pulses.   Pulmonary/Chest: Effort normal and breath sounds normal.  Abdominal: Soft. Bowel sounds are normal.  Musculoskeletal:       Right hip: He exhibits decreased range of motion, decreased strength and tenderness.  Neurological: He is alert. Coordination abnormal.  Skin: Skin is warm and dry.  Psychiatric: Judgment normal.    Assessment:   BP 118/77 (BP Location: Right Arm, Patient Position: Sitting, Cuff Size: Large)   Pulse 64   Temp 98.4 F (36.9 C) (Oral)   Resp 16   Ht 6\' 1"  (1.854 m)   Wt 222 lb (100.7 kg)   SpO2 100%   BMI 29.29 kg/m  Plan:    Chronic right hip pain - ketorolac (TORADOL) injection 60 mg; Inject 2 mLs (60 mg total) into the muscle once. - predniSONE (DELTASONE) 20 MG tablet; Take 3 tablets (60 mg total) by mouth daily with breakfast.  Dispense: 12 tablet; Refill: 0 - traMADol (ULTRAM) 50 MG tablet; Take 1 tablet (50 mg total) by mouth every 8 (eight) hours as needed for severe pain.  Dispense: 30 tablet; Refill: 0  Scientist, clinical (histocompatibility and immunogenetics) distributed. Home exercises discussed. NSAIDs per  medication orders. Follow up with sports medicine     RTC; Follow up as previously scheduled  The patient was given clear instructions to go to ER or return to medical center if symptoms do not improve, worsen or new problems develop. The patient verbalized understanding.    Donia Pounds  MSN, FNP-C [facility] [address] [place], [place] [address] [phone]

## 2017-07-07 ENCOUNTER — Other Ambulatory Visit: Payer: No Typology Code available for payment source | Admitting: *Deleted

## 2017-07-07 DIAGNOSIS — N183 Chronic kidney disease, stage 3 unspecified: Secondary | ICD-10-CM

## 2017-07-07 DIAGNOSIS — I5042 Chronic combined systolic (congestive) and diastolic (congestive) heart failure: Secondary | ICD-10-CM

## 2017-07-07 DIAGNOSIS — I10 Essential (primary) hypertension: Secondary | ICD-10-CM

## 2017-07-08 LAB — BASIC METABOLIC PANEL
BUN / CREAT RATIO: 19 (ref 9–20)
BUN: 26 mg/dL — ABNORMAL HIGH (ref 6–24)
CO2: 21 mmol/L (ref 20–29)
CREATININE: 1.34 mg/dL — AB (ref 0.76–1.27)
Calcium: 9.5 mg/dL (ref 8.7–10.2)
Chloride: 104 mmol/L (ref 96–106)
GFR calc Af Amer: 69 mL/min/{1.73_m2} (ref >59)
GFR calc non Af Amer: 60 mL/min/{1.73_m2} (ref >59)
GLUCOSE: 101 mg/dL — AB (ref 65–99)
POTASSIUM: 4.2 mmol/L (ref 3.5–5.2)
SODIUM: 141 mmol/L (ref 134–144)

## 2017-07-09 ENCOUNTER — Encounter: Payer: Self-pay | Admitting: Family Medicine

## 2017-07-12 ENCOUNTER — Emergency Department (HOSPITAL_COMMUNITY)
Admission: EM | Admit: 2017-07-12 | Discharge: 2017-07-12 | Disposition: A | Discharge status: Home / Self Care | Payer: No Typology Code available for payment source | Attending: Emergency Medicine | Admitting: Emergency Medicine

## 2017-07-12 ENCOUNTER — Emergency Department (HOSPITAL_COMMUNITY): Payer: No Typology Code available for payment source

## 2017-07-12 ENCOUNTER — Encounter (HOSPITAL_COMMUNITY): Payer: Self-pay | Admitting: Emergency Medicine

## 2017-07-12 DIAGNOSIS — M5431 Sciatica, right side: Secondary | ICD-10-CM | POA: Insufficient documentation

## 2017-07-12 DIAGNOSIS — N183 Chronic kidney disease, stage 3 (moderate): Secondary | ICD-10-CM | POA: Insufficient documentation

## 2017-07-12 DIAGNOSIS — I5042 Chronic combined systolic (congestive) and diastolic (congestive) heart failure: Secondary | ICD-10-CM | POA: Insufficient documentation

## 2017-07-12 DIAGNOSIS — Z79899 Other long term (current) drug therapy: Secondary | ICD-10-CM | POA: Insufficient documentation

## 2017-07-12 DIAGNOSIS — Z7982 Long term (current) use of aspirin: Secondary | ICD-10-CM | POA: Insufficient documentation

## 2017-07-12 DIAGNOSIS — I130 Hypertensive heart and chronic kidney disease with heart failure and stage 1 through stage 4 chronic kidney disease, or unspecified chronic kidney disease: Secondary | ICD-10-CM | POA: Insufficient documentation

## 2017-07-12 MED ORDER — OXYCODONE-ACETAMINOPHEN 5-325 MG PO TABS
2.0000 | ORAL_TABLET | Freq: Once | ORAL | Status: AC
  Administered 2017-07-12: 2 via ORAL
  Filled 2017-07-12: qty 2

## 2017-07-12 MED ORDER — TIZANIDINE HCL 2 MG PO CAPS: 2.0000 mg | ORAL_CAPSULE | Freq: Three times a day (TID) | ORAL | 0 refills | Status: DC

## 2017-07-12 MED ORDER — TRAMADOL HCL 50 MG PO TABS: 50.0000 mg | ORAL_TABLET | Freq: Four times a day (QID) | ORAL | 0 refills | Status: DC | PRN

## 2017-07-12 NOTE — Discharge Instructions (Signed)
Follow-up with your doctor to have an MRI of your proximal femur in 2-5 days

## 2017-07-12 NOTE — ED Notes (Signed)
Pt ambulated to restroom with minimal assist.

## 2017-07-12 NOTE — ED Provider Notes (Signed)
[facility] DEPT Provider Note   CSN: [id number] Arrival date & time: 07/12/17  0840     History   Chief Complaint Chief Complaint  Patient presents with  . Hip Pain    HPI Daniel Joseph is a 56 y.o. male.  56 year old male presents with worsening chronic right hip pain times several days.  Was seen by his doctors 9 days ago for similar symptoms and placed on prednisone as well as Ultram.  He was to follow-up with sports medicine and continues to note sharp pain that is localized to his sciatic region that is worse with standing.  Denies any foot drop.  No bowel or bladder function.  No fever or chills.  Denies any urinary symptoms.      Past Medical History:  Diagnosis Date  . Benign essential HTN 01/19/2014   Renal Artery [place] 6/16:  No RAS, No AAA  . Cardiomyopathy, dilated, nonischemic (HCC)    EF 45-50% bye echo 08/2015  . Chronic combined systolic and diastolic CHF (congestive heart failure) ([place]) 01/21/2014  . Gout   . Mitral valve regurgitation    Moderate MR by echo 2018  . PVC's (premature ventricular contractions)    nonsustained VT as well as PVCs - no ICD indicated due to EF 40-45%  . 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 PVC's (premature ventricular contractions)   . Acute midline low back pain without sciatica 09/19/2016  . Acute gout 03/06/2016  . Left elbow pain 03/06/2016  . Heel spur 08/29/2015  . Nonsustained paroxysmal ventricular tachycardia ([place]) 01/26/2015  . Encounter for staple removal 08/23/2014  . HTN (hypertension) 03/02/2014  . Chronic combined systolic and diastolic CHF (congestive heart failure) ([place]) 03/02/2014  . Abnormal EKG 03/02/2014  . Hypertensive cardiomyopathy ([facility]) 03/02/2014  . CKD (chronic kidney disease), stage III ([place] [facility]) 02/15/2014  . Immunization due 02/15/2014  . Proteinuria 02/15/2014  . Encounter to establish care  02/03/2014  . Neck nodule 02/01/2014  . Eye redness 02/01/2014  . Mitral valve regurgitation 01/21/2014  . 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 Nocturia 01/19/2014  . Headache 01/19/2014  . T wave inversion in EKG 01/19/2014    Past Surgical History:  Procedure Laterality Date  . ABDOMINAL SURGERY  at birth  . TEE WITHOUT CARDIOVERSION N/A 01/25/2014   Procedure: TRANSESOPHAGEAL ECHOCARDIOGRAM (TEE);  Surgeon: [name], MD;  Location: [facility];  Service: Cardiovascular;  Laterality: N/A;  . TEE WITHOUT CARDIOVERSION N/A 09/14/2014   Procedure: TRANSESOPHAGEAL ECHOCARDIOGRAM (TEE);  Surgeon: Sueanne Margarita, MD;  Location: [facility] ENDOSCOPY;  Service: Cardiovascular;  Laterality: N/A;       Home Medications    Prior to Admission medications   Medication Sig Start Date End Date Taking? Authorizing Provider  allopurinol (ZYLOPRIM) 100 MG tablet Take 1 tablet (100 mg total) by mouth 2 (two) times daily. 05/09/17   Dorena Dew, FNP  amLODipine (NORVASC) 10 MG tablet Take 1 tablet (10 mg total) by mouth daily. 05/05/17   Dorena Dew, FNP  aspirin EC 81 MG tablet Take 1 tablet (81 mg total) by mouth daily. 04/01/14   Deboraha Sprang, MD  benazepril (LOTENSIN) 40 MG tablet Take 1 tablet (40 mg total) by mouth daily. 02/12/17   Sueanne Margarita, MD  cetirizine (ZYRTEC) 10 MG tablet Take 1 tablet (10 mg total) by mouth daily. 11/18/16   Smith Robert,  Asencion Partridge, FNP  cloNIDine (CATAPRES) 0.2 MG tablet Take 1 tablet (0.2 mg total) by mouth daily. 05/05/17   Dorena Dew, FNP  doxazosin (CARDURA) 4 MG tablet Take 1.5 tablets (6 mg total) by mouth at bedtime. 08/13/16   Sueanne Margarita, MD  furosemide (LASIX) 40 MG tablet Take 1 tablet (40 mg total) by mouth daily. 05/05/17   Dorena Dew, FNP  gabapentin (NEURONTIN) 300 MG capsule Take 1 capsule (300 mg total) by mouth 3 (three) times daily. 06/26/17   Dorena Dew, FNP  hydrALAZINE (APRESOLINE) 50 MG tablet TAKE 2 TABLETS BY  MOUTH 3 TIMES DAILY 05/05/17   Dorena Dew, FNP  isosorbide mononitrate (IMDUR) 30 MG 24 hr tablet Take 1 tablet (30 mg total) by mouth daily. 08/13/16   Sueanne Margarita, MD  labetalol (NORMODYNE) 200 MG tablet Take 1 tablet (200 mg total) by mouth 2 (two) times daily. 05/05/17 04/30/18  Dorena Dew, FNP  potassium chloride (K-DUR) 10 MEQ tablet Take 1 tablet (10 mEq total) by mouth daily. 06/30/17 09/28/17  Sueanne Margarita, MD  spironolactone (ALDACTONE) 50 MG tablet Take 1 tablet (50 mg total) by mouth daily. 06/03/17 05/29/18  Sueanne Margarita, MD  traMADol (ULTRAM) 50 MG tablet Take 1 tablet (50 mg total) by mouth every 8 (eight) hours as needed for severe pain. 07/03/17   Dorena Dew, FNP    Family History Family History  Problem Relation Age of Onset  . Heart Problems Mother   . Stroke Mother   . Heart failure Mother   . Hypertension Mother   . Heart Problems Father   . Heart attack Father   . Hypertension Father   . Heart Problems Brother   . Heart attack Brother   . Hypertension Brother     Social History Social History  Substance Use Topics  . Smoking status: Never Smoker  . Smokeless tobacco: Never Used  . Alcohol use No     Comment: social     Allergies   Patient has no known allergies.   Review of Systems Review of Systems  All other systems reviewed and are negative.    Physical Exam Updated Vital Signs BP (!) 179/102 (BP Location: Right Arm)   Pulse 74   Temp 98 F (36.7 C) (Oral)   Resp 17   SpO2 100%   Physical Exam  Constitutional: He is oriented to person, place, and time. He appears well-developed and well-nourished.  Non-toxic appearance. No distress.  HENT:  Head: Normocephalic and atraumatic.  Eyes: Pupils are equal, round, and reactive to light. Conjunctivae, EOM and lids are normal.  Neck: Normal range of motion. Neck supple. No tracheal deviation present. No thyroid mass present.  Cardiovascular: Normal rate, regular rhythm  and normal heart sounds.  Exam reveals no gallop.   No murmur heard. Pulmonary/Chest: Effort normal and breath sounds normal. No stridor. No respiratory distress. He has no decreased breath sounds. He has no wheezes. He has no rhonchi. He has no rales.  Abdominal: Soft. Normal appearance and bowel sounds are normal. He exhibits no distension. There is no tenderness. There is no rebound and no CVA tenderness.  Musculoskeletal: Normal range of motion. He exhibits no edema or tenderness.       Legs: Neurological: He is alert and oriented to person, place, and time. He has normal strength. No cranial nerve deficit or sensory deficit. GCS eye subscore is 4. GCS verbal subscore is 5. GCS  motor subscore is 6.  Reflex Scores:      Patellar reflexes are 2+ on the right side. Skin: Skin is warm and dry. No abrasion and no rash noted.  Psychiatric: He has a normal mood and affect. His speech is normal and behavior is normal.  Nursing note and vitals reviewed.    ED Treatments / Results  Labs (all labs ordered are listed, but only abnormal results are displayed) Labs Reviewed - No data to display  EKG  EKG Interpretation None       Radiology No results found.  Procedures Procedures (including critical care time)  Medications Ordered in ED Medications  oxyCODONE-acetaminophen (PERCOCET/ROXICET) 5-325 MG per tablet 2 tablet (not administered)     Initial Impression / Assessment and Plan / ED Course  I have reviewed the triage vital signs and the nursing notes.  Pertinent labs & imaging results that were available during my care of the patient were reviewed by me and considered in my medical decision making (see chart for details).     Patient medicated here with Percocet and feels better.  Patient's pain is concerning for sciatica.  He denies any proximal femur pain.  X-ray results reviewed with him and was instructed to follow-up with his doctor to have an MRI.  Patient to be  discharged on muscle relaxants and short course of opiates  Final Clinical Impressions(s) / ED Diagnoses   Final diagnoses:  None    New Prescriptions New Prescriptions   No medications on file     Lacretia Leigh, MD 07/12/17 1125

## 2017-07-12 NOTE — ED Triage Notes (Signed)
Per pt, states right hip pain for over a month-states no injury-states he went to PCP and she diagnosed him with arthritis-states not getting better-no films where done

## 2017-07-14 ENCOUNTER — Ambulatory Visit: Payer: No Typology Code available for payment source | Admitting: Family Medicine

## 2017-07-14 ENCOUNTER — Telehealth: Payer: Self-pay

## 2017-07-14 NOTE — Telephone Encounter (Signed)
Patient states he needs an MRI ordered. I advised that we would need to see him for a visit to be evaluated to see if this needs to be done. Appointment has been scheduled. Thanks!

## 2017-07-16 ENCOUNTER — Encounter: Payer: Self-pay | Admitting: Family Medicine

## 2017-07-16 ENCOUNTER — Ambulatory Visit (INDEPENDENT_AMBULATORY_CARE_PROVIDER_SITE_OTHER): Payer: Self-pay | Admitting: Family Medicine

## 2017-07-16 VITALS — BP 138/86 | HR 64 | Temp 97.6°F | Resp 16 | Ht 73.0 in | Wt 221.0 lb

## 2017-07-16 DIAGNOSIS — M25551 Pain in right hip: Secondary | ICD-10-CM

## 2017-07-16 DIAGNOSIS — R9389 Abnormal findings on diagnostic imaging of other specified body structures: Secondary | ICD-10-CM

## 2017-07-16 MED ORDER — KETOROLAC TROMETHAMINE 60 MG/2ML IM SOLN
60.0000 mg | Freq: Once | INTRAMUSCULAR | Status: AC
  Administered 2017-07-16: 60 mg via INTRAMUSCULAR

## 2017-07-16 NOTE — Progress Notes (Signed)
Subjective:    Daniel Joseph is a 56 y.o. male who presents with right hip pain.  Daniel Joseph has worsening right hip pain.  Patient was evaluated in the emergency department on July 12, 2017 for the same problem.  An x-ray was taken at the time which was abnormal and an MRI of the right femur with and without contrast was recommended for further evaluation.  Patient was evaluated in the emergency department onset of the symptoms was several weeks ago. Aggravated by prolonged sitting and laying down.  The patient reports the hip pain is worse with weight bearing, is aggravated by walking and radiates to right leg. Aggravating symptoms include: any weight bearing, going up and down stairs, inactivity, kneeling, lateral movements and rising after sitting. Patient has had prior hip problems. Previous visits for this problem: multiple, this is a longstanding diagnosis. Last seen several weeks ago by me.  Past Medical History:  Diagnosis Date  . Benign essential HTN 01/19/2014   Renal Artery [place] 6/16:  No RAS, No AAA  . Cardiomyopathy, dilated, nonischemic (HCC)    EF 45-50% bye echo 08/2015  . Chronic combined systolic and diastolic CHF (congestive heart failure) ([place]) 01/21/2014  . Gout   . Mitral valve regurgitation    Moderate MR by echo 2018  . PVC's (premature ventricular contractions)    nonsustained VT as well as PVCs - no ICD indicated due to EF 40-45%  . 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 Marital status: Single    Spouse name: N/A  . Number of children: N/A  . Years of education: N/A   Occupational History  . Not on file.   Social History Main Topics  . Smoking status: Never Smoker  . Smokeless tobacco: Never Used  . Alcohol use No     Comment: social  . Drug use: No  . Sexual activity: Not on file   Other Topics Concern  . Not on file   Social History Narrative  . No narrative on file   Immunization History   Administered Date(s) Administered  . Influenza,inj,Quad PF,6+ Mos 06/16/2014, 07/01/2016  . Tdap 02/10/2014   No Known Allergies Review of Systems  Constitutional: Negative.   Eyes: Negative.   Respiratory: Negative.   Cardiovascular: Negative.  Negative for chest pain and palpitations.  Gastrointestinal: Negative.   Musculoskeletal: Positive for joint pain (right hip pain).  Neurological: Negative.   Psychiatric/Behavioral: Negative.      Objective:  Physical Exam  Constitutional: He is well-developed, well-nourished, and in no distress.  Cardiovascular: Normal rate, regular rhythm, normal heart sounds and intact distal pulses.   Pulmonary/Chest: Effort normal and breath sounds normal.  Abdominal: Soft. Bowel sounds are normal.  Musculoskeletal:       Right hip: He exhibits decreased range of motion, decreased strength and tenderness.  Neurological: He is alert. Coordination abnormal.  Skin: Skin is warm and dry.  Psychiatric: Judgment normal.    Assessment:   BP 138/86 (BP Location: Left Arm, Patient Position: Sitting, Cuff Size: Large) Comment: manually  Pulse 64   Temp 97.6 F (36.4 C) (Oral)   Resp 16   Ht 6\' 1"  (1.854 m)   Wt 221 lb (100.2 kg)   SpO2 100%   BMI 29.16 kg/m  Plan:    1. Right hip pain Patient has established care with orthopedic specialist but has been lost to follow-up due to financial constraints.  He has to renew financial assistance to [facility] before rescheduling with orthopedic specialist.  Right hip pain is a chronic condition that warrants further evaluation.  Current pain intensity is 10 out of 10 will give a one-time injection of Toradol 60 mg IM.  Patient is to continue tramadol 50 mg every 6 hours as previously prescribed for moderate to severe right hip pain. - ketorolac (TORADOL) injection 60 mg; Inject 2 mLs (60 mg total) into the muscle once.  2. Abnormal x-ray DG HIP (WITH OR WITHOUT PELVIS) 2-3V  RIGHT  COMPARISON:  None.  FINDINGS: No acute fracture or malalignment. The bilateral hip joint spaces are preserved. Somewhat septated periosteal reaction along the anterior femur with questionable underlying cortical irregularity. Soft tissues are unremarkable.  IMPRESSION: Questionable cortical irregularity of the anterior proximal femoral diaphysis with adjacent somewhat septated periosteal reaction, nonspecific. Recommend MRI of the right femur with and without contrast for further evaluation.    X-ray of right hip was abnormal.  Recommend an MRI of right femur with and without contrast for further evaluation.  We will follow-up by phone after reviewing MRI results. - MR FEMUR RIGHT W WO CONTRAST; Future  RTC; Follow up as previously scheduled  The patient was given clear instructions to go to ER or return to medical center if symptoms do not improve, worsen or new problems develop. The patient verbalized understanding.    Donia Pounds  MSN, FNP-C [facility] [address] [place], [place] [address] [phone]

## 2017-07-16 NOTE — Patient Instructions (Addendum)
Patient continues to have pain to right hip 10 out of 10 status.  Will continue tramadol 50 mg every 6 hours for moderate-to-severe pain.  Patient is to refrain from taking nonsteroidal anti-inflammatory drugs due to elevated creatinine levels.  Patient given a one-time dose of Toradol 60 mg IM without complication. An MRI of the right femur has been ordered due to an abnormal x-ray. Patient is to schedule appointment with orthopedic services through [facility].  He is also to contact the financial assistance department at [facility]. We will follow-up by phone after reviewing MRI results. Patient is to follow-up as previously scheduled for chronic conditions

## 2017-07-18 ENCOUNTER — Ambulatory Visit (HOSPITAL_COMMUNITY)
Admission: RE | Admit: 2017-07-18 | Discharge: 2017-07-18 | Disposition: A | Discharge status: Home / Self Care | Payer: No Typology Code available for payment source | Source: Ambulatory Visit | Attending: Family Medicine | Admitting: Family Medicine

## 2017-07-18 DIAGNOSIS — R9389 Abnormal findings on diagnostic imaging of other specified body structures: Secondary | ICD-10-CM | POA: Insufficient documentation

## 2017-07-18 MED ORDER — GADOBENATE DIMEGLUMINE 529 MG/ML IV SOLN
20.0000 mL | Freq: Once | INTRAVENOUS | Status: AC | PRN
  Administered 2017-07-18: 20 mL via INTRAVENOUS

## 2017-07-18 MED FILL — tiZANidine HCL 2 MG TABS: 2 | 6 days supply | Qty: 20 | Fill #0

## 2017-07-21 ENCOUNTER — Telehealth: Payer: Self-pay

## 2017-07-21 ENCOUNTER — Telehealth: Payer: Self-pay | Admitting: Family Medicine

## 2017-07-21 NOTE — Telephone Encounter (Signed)
Please advise. Thanks.  

## 2017-07-21 NOTE — Telephone Encounter (Signed)
Reviewed and discussed MRI results with Mr. Salasar.  Recommend the patient follows up with [facility] orthopedic services.  He was undergoing physical therapy with the orthopedics department and has been lost to follow-up.  Patient had an MRI of right femur which yielded the following results:  CLINICAL DATA:  Severe right hip and femur pain and difficulty walking for 2 months. No known injury.  EXAM: MRI OF THE RIGHT FEMUR WITHOUT AND WITH CONTRAST  TECHNIQUE: Multiplanar, multisequence MR imaging of the right femur was performed both before and after administration of intravenous contrast.  CONTRAST:  20 ml MULTIHANCE GADOBENATE DIMEGLUMINE 529 MG/ML IV SOLN  COMPARISON:  Plain films of the right hip 07/12/2017.  FINDINGS: Bones/Joint/Cartilage  There is mature bone contiguous with the anterior cortex of the right femur correlating with finding on MRI. In total, area of ossification measures 11.1 cm craniocaudal by 2.4 cm AP by 2.2 cm transverse. The lesion is not continuous with the medullary space of the right femur as is seen in an osteochondroma. No soft tissue or bony enhancement about the lesion is identified. No adventitial bursa formation is present. Ossification is within the vastus intermedius.  Ligaments  Intact.  Muscles and Tendons  Intact.  Soft tissues  As described above.  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 No follow-up imaging is recommended. No evidence of aggressive neoplasm is seen. Negative for acute muscle or tendon abnormality.    We will follow-up with patient in office as previously scheduled.   Donia Pounds  MSN, FNP-C [facility] [address] [place], [place] [address] [phone]

## 2017-07-22 NOTE — Telephone Encounter (Signed)
Called and spoke with Daniel Joseph, He has to reapply for the financial Assistance before an appointment can be scheduled. I have faxed the information to their office today and he was given the number to call for Financial Assistance. Thanks!

## 2017-07-28 MED FILL — ?SPIRONOLACTONE 25 MG TABLE: 25 | 30 days supply | Qty: 30 | Fill #1

## 2017-07-28 MED FILL — cloNIDine HCL 0.2 MG TABS: 0.2 | 30 days supply | Qty: 30 | Fill #4

## 2017-07-28 MED FILL — AMLODIPINE BESYLATE 10 MG T: 10 | 30 days supply | Qty: 30 | Fill #0

## 2017-07-28 MED FILL — FUROSEMIDE 40 MG TAB: 40 | 30 days supply | Qty: 30 | Fill #2

## 2017-07-31 ENCOUNTER — Telehealth: Payer: Self-pay

## 2017-07-31 NOTE — Telephone Encounter (Signed)
-----   Message from Dorena Dew, [place] sent at 07/31/2017  1:57 PM EST ----- Patient is requesting a letter stating that he cannot work at this time.  Due to HIPPA, patient will have to disclose the person or company that we are sending this letter to.  Also, if this letter is related to disability I will be unable to write it.  Disability services can request medical records release so that they can review all of my previous notes, which explains the details of the patient's condition.   In conclusion, I will not be writing a general letter until the patient can provide that information. Thanks

## 2017-07-31 NOTE — Telephone Encounter (Signed)
Called, no answer. Left a message for patient to call back. Thanks!  

## 2017-07-31 NOTE — Telephone Encounter (Signed)
Patient called and wants to know if he can have a letter saying he is unable to work at this time due to his condition? Please advise. Thanks!

## 2017-07-31 NOTE — Telephone Encounter (Signed)
Spoke with patient and advised that this is for disability and he will need to have them request his records. Patient verbalized understanding. Thanks!

## 2017-08-13 ENCOUNTER — Ambulatory Visit (INDEPENDENT_AMBULATORY_CARE_PROVIDER_SITE_OTHER): Payer: Self-pay | Admitting: Family Medicine

## 2017-08-13 ENCOUNTER — Encounter: Payer: Self-pay | Admitting: Family Medicine

## 2017-08-13 VITALS — BP 142/88 | HR 65 | Temp 97.7°F | Resp 16 | Ht 73.0 in | Wt 231.0 lb

## 2017-08-13 DIAGNOSIS — N183 Chronic kidney disease, stage 3 unspecified: Secondary | ICD-10-CM

## 2017-08-13 DIAGNOSIS — E785 Hyperlipidemia, unspecified: Secondary | ICD-10-CM

## 2017-08-13 DIAGNOSIS — I119 Hypertensive heart disease without heart failure: Secondary | ICD-10-CM

## 2017-08-13 DIAGNOSIS — G629 Polyneuropathy, unspecified: Secondary | ICD-10-CM

## 2017-08-13 DIAGNOSIS — I43 Cardiomyopathy in diseases classified elsewhere: Secondary | ICD-10-CM

## 2017-08-13 LAB — BASIC METABOLIC PANEL WITH GFR
BUN / CREAT RATIO: 11 (calc) (ref 6–22)
BUN: 16 mg/dL (ref 7–25)
CHLORIDE: 104 mmol/L (ref 98–110)
CO2: 25 mmol/L (ref 20–32)
Calcium: 9.5 mg/dL (ref 8.6–10.3)
Creat: 1.4 mg/dL — ABNORMAL HIGH (ref 0.70–1.33)
GFR, EST AFRICAN AMERICAN: 66 mL/min/{1.73_m2} (ref >=60)
GFR, EST NON AFRICAN AMERICAN: 57 mL/min/{1.73_m2} — AB (ref >=60)
Glucose, Bld: 94 mg/dL (ref 65–99)
POTASSIUM: 4.2 mmol/L (ref 3.5–5.3)
SODIUM: 138 mmol/L (ref 135–146)

## 2017-08-13 LAB — LIPID PANEL
Cholesterol: 163 mg/dL (ref <200)
HDL: 47 mg/dL (ref >40)
LDL Cholesterol (Calc): 94 mg/dL (calc)
NON-HDL CHOLESTEROL (CALC): 116 mg/dL (ref <130)
TRIGLYCERIDES: 120 mg/dL (ref <150)
Total CHOL/HDL Ratio: 3.5 (calc) (ref <5.0)

## 2017-08-13 LAB — POCT URINALYSIS DIP (DEVICE)
Bilirubin Urine: NEGATIVE
GLUCOSE, UA: NEGATIVE mg/dL
HGB URINE DIPSTICK: NEGATIVE
Ketones, ur: NEGATIVE mg/dL
LEUKOCYTES UA: NEGATIVE
NITRITE: NEGATIVE
Protein, ur: NEGATIVE mg/dL
SPECIFIC GRAVITY, URINE: 1.01 (ref 1.005–1.030)
UROBILINOGEN UA: 0.2 mg/dL (ref 0.0–1.0)
pH: 5 (ref 5.0–8.0)

## 2017-08-13 MED ORDER — GABAPENTIN 300 MG PO CAPS: 300.0000 mg | ORAL_CAPSULE | Freq: Three times a day (TID) | ORAL | 3 refills | Status: DC

## 2017-08-13 NOTE — Progress Notes (Signed)
Subjective:    Patient ID: Daniel Joseph, male    DOB: [birth year], 56 y.o.   MRN: [id number] Chief Complaint  Patient presents with  . Hypertension  . Follow-up   HPI  Daniel Joseph, a 56 year old male with a history of hypertensive cardiomyopathy and stage III chronic kidney disease presents for follow-up antihypertensive medications consistently.  He has not been following a low-fat diet or exercising routinely.  Patient currently denies headaches, dizziness, neck pain, orthopnea, palpitations or shortness of breath.  He does not have a history of sleep apnea or thyroid problem.  Current cardiac risk factors include sedentary lifestyle.   Past Medical History:  Diagnosis Date  . Benign essential HTN 01/19/2014   Renal Artery [place] 6/16:  No RAS, No AAA  . Cardiomyopathy, dilated, nonischemic (HCC)    EF 45-50% bye echo 08/2015  . Chronic combined systolic and diastolic CHF (congestive heart failure) ([place]) 01/21/2014  . Gout   . Mitral valve regurgitation    Moderate MR by echo 2018  . PVC's (premature ventricular contractions)    nonsustained VT as well as PVCs - no ICD indicated due to EF 40-45%  . 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 Marital status: Single    Spouse name: Not on file  . Number of children: Not on file  . Years of education: Not on file  . Highest education level: Not on file  Social Needs  . Financial resource strain: Not on file  . Food insecurity - worry: Not on file  . Food insecurity - inability: Not on file  . Transportation needs - medical: Not on file  . Transportation needs - non-medical: Not on file  Occupational History  . Not on file  Tobacco Use  . Smoking status: Never Smoker  . Smokeless tobacco: Never Used  Substance and Sexual Activity  . Alcohol use: No    Comment: social  . Drug use: No  . Sexual activity: Not on file  Other Topics Concern  . Not on file  Social  History Narrative  . Not on file   Immunization History  Administered Date(s) Administered  . Influenza,inj,Quad PF,6+ Mos 06/16/2014, 07/01/2016  . Tdap 02/10/2014  No Known Allergies  Review of Systems  Constitutional: Negative for fever.  HENT: Negative.   Eyes: Negative for blurred vision.  Respiratory: Negative.  Negative for shortness of breath.   Cardiovascular: Negative.  Negative for chest pain, palpitations, orthopnea, leg swelling and PND.  Gastrointestinal: Negative.   Endocrine: Negative for polydipsia, polyphagia and polyuria.  Genitourinary: Negative.   Musculoskeletal: Negative.  Negative for neck pain.  Skin: Negative.   Allergic/Immunologic: Negative.   Neurological: Negative.  Negative for headaches.  Hematological: Negative.   Psychiatric/Behavioral: Negative.        Objective:   Physical Exam  Constitutional: He is oriented to person, place, and time.  Neck: Normal carotid pulses, no hepatojugular reflux and no JVD present. Carotid bruit is not present. No thyroid mass present.  Cardiovascular: Normal rate, regular rhythm, S1 normal and S2 normal.  No murmur heard. Pulses:      Carotid pulses are 2+ on the right side, and 2+ on the left side.      Radial pulses are 2+ on the right side, and 2+ on the left side.       Femoral pulses are 2+ on the right side, and 2+ on  the left side.      Popliteal pulses are 2+ on the right side, and 2+ on the left side.       Dorsalis pedis pulses are 2+ on the right side, and 2+ on the left side.       Posterior tibial pulses are 2+ on the right side, and 2+ on the left side.  Pulmonary/Chest: Effort normal and breath sounds normal.  Abdominal: Soft. Bowel sounds are normal.  Musculoskeletal: Normal range of motion.  Neurological: He is alert and oriented to person, place, and time. He has normal reflexes.  Skin: Skin is warm and dry.  Psychiatric: He has a normal mood and affect. His behavior is normal. Judgment  normal.     BP (!) 142/88 (BP Location: Right Arm, Patient Position: Sitting, Cuff Size: Large) Comment: manually  Pulse 65   Temp 97.7 F (36.5 C) (Oral)   Resp 16   Ht 6\' 1"  (1.854 m)   Wt 231 lb (104.8 kg)   SpO2 100%   BMI 30.48 kg/m  Assessment & Plan:  1. 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 CKD (chronic kidney disease), stage III (HCC) - BASIC METABOLIC PANEL WITH GFR - POCT urinalysis dip (device)  3. Neuropathy - gabapentin (NEURONTIN) 300 MG capsule; Take 1 capsule (300 mg total) by mouth 3 (three) times daily.  Dispense: 90 capsule; Refill: 3  4. Hyperlipidemia LDL goal <100 The 10-year ASCVD risk score Daniel Bussing DC Jr., et al., 2013) is: 12.7%   Values used to calculate the score:     Age: 39 years     Sex: Male     Is Non-Hispanic African American: Yes     Diabetic: No     Tobacco smoker: No     Systolic Blood Pressure: 702 mmHg     Is BP treated: Yes     HDL Cholesterol: 37 mg/dL     Total Cholesterol: 175 mg/dL  - Lipid Panel   RTC; 3 months for hypertension   Daniel Pounds  MSN, FNP-C [facility] [address] [place], [place] [address] [phone]

## 2017-08-13 NOTE — Patient Instructions (Signed)
Your blood pressure is at goal today, will continue all antihypertensive medications as previously prescribed. We will follow-up by phone with any abnormal lab results.  We will continue the DASH diet and provide information pertaining to that particular diet. DASH Eating Plan DASH stands for "Dietary Approaches to Stop Hypertension." The DASH eating plan is a healthy eating plan that has been shown to reduce high blood pressure (hypertension). It may also reduce your risk for type 2 diabetes, heart disease, and stroke. The DASH eating plan may also help with weight loss. What are tips for following this plan? General guidelines  Avoid eating more than 2,300 mg (milligrams) of salt (sodium) a day. If you have hypertension, you may need to reduce your sodium intake to 1,500 mg a day.  Limit alcohol intake to no more than 1 drink a day for nonpregnant women and 2 drinks a day for men. One drink equals 12 oz of beer, 5 oz of wine, or 1 oz of hard liquor.  Work with your health care provider to maintain a healthy body weight or to lose weight. Ask what an ideal weight is for you.  Get at least 30 minutes of exercise that causes your heart to beat faster (aerobic exercise) most days of the week. Activities may include walking, swimming, or biking.  Work with your health care provider or diet and nutrition specialist (dietitian) to adjust your eating plan to your individual calorie needs. Reading food labels  Check food labels for the amount of sodium per serving. Choose foods with less than 5 percent of the Daily Value of sodium. Generally, foods with less than 300 mg of sodium per serving fit into this eating plan.  To find whole grains, look for the word "whole" as the first word in the ingredient list. Shopping  Buy products labeled as "low-sodium" or "no salt added."  Buy fresh foods. Avoid canned foods and premade or frozen meals. Cooking  Avoid adding salt when cooking. Use salt-free  seasonings or herbs instead of table salt or sea salt. Check with your health care provider or pharmacist before using salt substitutes.  Do not fry foods. Cook foods using healthy methods such as baking, boiling, grilling, and broiling instead.  Cook with heart-healthy oils, such as olive, canola, soybean, or sunflower oil. Meal planning   Eat a balanced diet that includes: ? 5 or more servings of fruits and vegetables each day. At each meal, try to fill half of your plate with fruits and vegetables. ? Up to 6-8 servings of whole grains each day. ? Less than 6 oz of lean meat, poultry, or fish each day. A 3-oz serving of meat is about the same size as a deck of cards. One egg equals 1 oz. ? 2 servings of low-fat dairy each day. ? A serving of nuts, seeds, or beans 5 times each week. ? Heart-healthy fats. Healthy fats called Omega-3 fatty acids are found in foods such as flaxseeds and coldwater fish, like sardines, salmon, and mackerel.  Limit how much you eat of the following: ? Canned or prepackaged foods. ? Food that is high in trans fat, such as fried foods. ? Food that is high in saturated fat, such as fatty meat. ? Sweets, desserts, sugary drinks, and other foods with added sugar. ? Full-fat dairy products.  Do not salt foods before eating.  Try to eat at least 2 vegetarian meals each week.  Eat more home-cooked food and less restaurant, buffet, and fast  food.  When eating at a restaurant, ask that your food be prepared with less salt or no salt, if possible. What foods are recommended? The items listed may not be a complete list. Talk with your dietitian about what dietary choices are best for you. Grains Whole-grain or whole-wheat bread. Whole-grain or whole-wheat pasta. Brown rice. [place]. Bulgur. Whole-grain and low-sodium cereals. Pita bread. Low-fat, low-sodium crackers. Whole-wheat flour tortillas. Vegetables Fresh or frozen vegetables (raw, steamed, roasted,  or grilled). Low-sodium or reduced-sodium tomato and vegetable juice. Low-sodium or reduced-sodium tomato sauce and tomato paste. Low-sodium or reduced-sodium canned vegetables. Fruits All fresh, dried, or frozen fruit. Canned fruit in natural juice (without added sugar). Meat and other protein foods Skinless chicken or [place]. Ground chicken or [place]. Pork with fat trimmed off. Fish and seafood. Egg whites. Dried beans, peas, or lentils. Unsalted nuts, nut butters, and seeds. Unsalted canned beans. Lean cuts of beef with fat trimmed off. Low-sodium, lean deli meat. Dairy Low-fat (1%) or fat-free (skim) milk. Fat-free, low-fat, or reduced-fat cheeses. Nonfat, low-sodium ricotta or cottage cheese. Low-fat or nonfat yogurt. Low-fat, low-sodium cheese. Fats and oils Soft margarine without trans fats. Vegetable oil. Low-fat, reduced-fat, or light mayonnaise and salad dressings (reduced-sodium). Canola, safflower, olive, soybean, and sunflower oils. Avocado. Seasoning and other foods Herbs. Spices. Seasoning mixes without salt. Unsalted popcorn and pretzels. Fat-free sweets. What foods are not recommended? The items listed may not be a complete list. Talk with your dietitian about what dietary choices are best for you. Grains Baked goods made with fat, such as croissants, muffins, or some breads. Dry pasta or rice meal packs. Vegetables Creamed or fried vegetables. Vegetables in a cheese sauce. Regular canned vegetables (not low-sodium or reduced-sodium). Regular canned tomato sauce and paste (not low-sodium or reduced-sodium). Regular tomato and vegetable juice (not low-sodium or reduced-sodium). Angie Fava. Olives. Fruits Canned fruit in a light or heavy syrup. Fried fruit. Fruit in cream or butter sauce. Meat and other protein foods Fatty cuts of meat. Ribs. Fried meat. Berniece Salines. Sausage. Bologna and other processed lunch meats. Salami. Fatback. Hotdogs. Bratwurst. Salted nuts and seeds. Canned beans  with added salt. Canned or smoked fish. Whole eggs or egg yolks. Chicken or [place] with skin. Dairy Whole or 2% milk, cream, and half-and-half. Whole or full-fat cream cheese. Whole-fat or sweetened yogurt. Full-fat cheese. Nondairy creamers. Whipped toppings. Processed cheese and cheese spreads. Fats and oils Butter. Stick margarine. Lard. Shortening. Ghee. Bacon fat. Tropical oils, such as coconut, palm kernel, or palm oil. Seasoning and other foods Salted popcorn and pretzels. Onion salt, garlic salt, seasoned salt, table salt, and sea salt. Worcestershire sauce. Tartar sauce. Barbecue sauce. Teriyaki sauce. Soy sauce, including reduced-sodium. Steak sauce. Canned and packaged gravies. Fish sauce. Oyster sauce. Cocktail sauce. Horseradish that you find on the shelf. Ketchup. Mustard. Meat flavorings and tenderizers. Bouillon cubes. Hot sauce and Tabasco sauce. Premade or packaged marinades. Premade or packaged taco seasonings. Relishes. Regular salad dressings. Where to find more information:  National Heart, Lung, and [place]: https://wilson-eaton.com/  American Heart Association: www.heart.org Summary  The DASH eating plan is a healthy eating plan that has been shown to reduce high blood pressure (hypertension). It may also reduce your risk for type 2 diabetes, heart disease, and stroke.  With the DASH eating plan, you should limit salt (sodium) intake to 2,300 mg a day. If you have hypertension, you may need to reduce your sodium intake to 1,500 mg a day.  When on the DASH  eating plan, aim to eat more fresh fruits and vegetables, whole grains, lean proteins, low-fat dairy, and heart-healthy fats.  Work with your health care provider or diet and nutrition specialist (dietitian) to adjust your eating plan to your individual calorie needs. This information is not intended to replace advice given to you by your health care provider. Make sure you discuss any questions you have with your health  care provider. Document Released: 08/22/2011 Document Revised: 08/26/2016 Document Reviewed: 08/26/2016 Elsevier Interactive Patient Education  2017 [employer].

## 2017-10-03 ENCOUNTER — Other Ambulatory Visit: Payer: Self-pay | Admitting: Family Medicine

## 2017-10-03 ENCOUNTER — Encounter: Payer: Self-pay | Admitting: Gastroenterology

## 2017-10-03 DIAGNOSIS — M25559 Pain in unspecified hip: Secondary | ICD-10-CM

## 2017-10-03 DIAGNOSIS — Z1211 Encounter for screening for malignant neoplasm of colon: Secondary | ICD-10-CM

## 2017-10-03 DIAGNOSIS — G8929 Other chronic pain: Secondary | ICD-10-CM

## 2017-10-07 ENCOUNTER — Other Ambulatory Visit: Payer: Self-pay

## 2017-10-07 ENCOUNTER — Ambulatory Visit: Payer: Self-pay | Attending: Family Medicine | Admitting: Physical Therapy

## 2017-10-07 ENCOUNTER — Encounter: Payer: Self-pay | Admitting: Physical Therapy

## 2017-10-07 DIAGNOSIS — M25551 Pain in right hip: Secondary | ICD-10-CM | POA: Insufficient documentation

## 2017-10-07 DIAGNOSIS — R262 Difficulty in walking, not elsewhere classified: Secondary | ICD-10-CM | POA: Insufficient documentation

## 2017-10-07 DIAGNOSIS — R29898 Other symptoms and signs involving the musculoskeletal system: Secondary | ICD-10-CM | POA: Insufficient documentation

## 2017-10-08 NOTE — Therapy (Signed)
[place], [place], [address] Phone: [phone]   Fax:  [phone]  Physical Therapy Evaluation  Patient Details  Name: Daniel Joseph MRN: [id number] Date of Birth: [birth year] Referring Provider: Dr Cammie Sickle    Encounter Date: 10/07/2017  PT End of Session - 10/07/17 0816    Visit Number  1    Number of Visits  16    Date for PT Re-Evaluation  12/02/17    Authorization Type  CAFA 100%    PT Start Time  0800    PT Stop Time  0845    PT Time Calculation (min)  45 min    Activity Tolerance  Patient tolerated treatment well    Behavior During Therapy  [facility] for tasks assessed/performed       Past Medical History:  Diagnosis Date  . Benign essential HTN 01/19/2014   Renal Artery [place] 6/16:  No RAS, No AAA  . Cardiomyopathy, dilated, nonischemic (HCC)    EF 45-50% bye echo 08/2015  . Chronic combined systolic and diastolic CHF (congestive heart failure) ([place]) 01/21/2014  . Gout   . Mitral valve regurgitation    Moderate MR by echo 2018  . PVC's (premature ventricular contractions)    nonsustained VT as well as PVCs - no ICD indicated due to EF 40-45%  . 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 ABDOMINAL SURGERY  at birth  . TEE WITHOUT CARDIOVERSION N/A 01/25/2014   Procedure: TRANSESOPHAGEAL ECHOCARDIOGRAM (TEE);  Surgeon: [name], MD;  Location: [place];  Service: Cardiovascular;  Laterality: N/A;  . TEE WITHOUT CARDIOVERSION N/A 09/14/2014   Procedure: TRANSESOPHAGEAL ECHOCARDIOGRAM (TEE);  Surgeon: Sueanne Margarita, MD;  Location: [facility] ENDOSCOPY;  Service: Cardiovascular;  Laterality: N/A;    There were no vitals filed for this visit.   Subjective Assessment - 10/07/17 0807    Subjective  Patient reports over the past 2-3 months he has begun to have significant right hip pain. The pain has increased to the point  were he has difficulty with all prolinged activity such assitting, standing, and lying down.     Limitations  Sitting;Walking    How long can you sit comfortably?  10 minutes or so     How long can you stand comfortably?  Depends on the day     How long can you walk comfortably?  a couple of 56 [place] feels it     Diagnostic tests  MRI of the femur: myositis ossificans of the vastus intermedius     Patient Stated Goals  Less [place]     Currently in Pain?  Yes    Pain Score  7     Pain Location  Hip    Pain Orientation  Right    Pain Descriptors / Indicators  Aching    Pain Type  Chronic pain    Pain Onset  More than a month ago    Pain Frequency  Constant    Aggravating Factors   turning, sitting; movements to the side     Pain Relieving Factors  rice; ice    Effect of Pain on Daily Activities  Diffiulty perfroming daily tasks          [facility] PT Assessment - 10/08/17 0001      Assessment   Medical Diagnosis  Right hip pain     Referring Provider  Dr Cammie Sickle     Onset Date/Surgical Date  -- Pain increased over the past 2-3 months     Hand Dominance  Right    Next MD Visit  Next Month     Prior Therapy  None       Precautions   Precautions  None      Restrictions   Weight Bearing Restrictions  No      Balance Screen   Has the patient fallen in the past 6 months  No    Has the patient had a decrease in activity level because of a fear of falling?   No    Is the patient reluctant to leave their home because of a fear of falling?   No      Home Film/video editor residence      Prior Function   Level of Independence  Independent      Cognition   Overall Cognitive Status  Within Functional Limits for tasks assessed    Attention  Focused    Focused Attention  Appears intact    Memory  Appears intact    Awareness  Appears intact    Problem Solving  Appears intact      Observation/Other Assessments   Focus on Therapeutic  Outcomes (FOTO)   77% limitation 55% expected       Sensation   Light Touch  Appears Intact      Coordination   Gross Motor Movements are Fluid and Coordinated  Yes    Fine Motor Movements are Fluid and Coordinated  Yes      AROM   Right/Left Hip  Right;Left    Right Hip Flexion  50 50 degrees before he feels pain     Left Hip Flexion  100    Right/Left Knee  Right;Left      PROM   Right/Left Hip  Right    Right Hip External Rotation   -- Painful    Right Hip Internal Rotation   -- Painful      Strength   Right Hip Flexion  3/5    Right Hip ABduction  3/5    Right Hip ADduction  3+/5    Left Hip Flexion  4+/5    Left Hip ABduction  4+/5    Left Hip ADduction  4+/5      Right Hip   Right Hip Flexion  45 Patient was gauring because of pain.       Palpation   Palpation comment  significant tenenress to palpation on the right lateral hip and right lower back       Special Tests    Special Tests  Lumbar;Hip Special Tests    Lumbar Tests  Straight Leg Raise    Hip Special Tests   Saralyn Pilar (FABER) Test      Straight Leg Raise   Comment  (+) right       Saralyn Pilar (FABER) Test   Comments  (+) right       Transfers   Comments  Uses his hands to push up to stand.       Ambulation/Gait   Gait Comments  Right trendelenburgh; decreased right single leg stance, lateral movement with gait; Decreased right hip flexion              Objective measurements completed on examination: See above findings.      [facility] Adult PT Treatment/Exercise - 10/08/17 0001  Knee/Hip Exercises: Supine   Quad Sets Limitations  x10 glut sets x10     Heel Slides Limitations  with strap x5 10 sec holds              PT Education - 10/07/17 0816    Education provided  Yes    Education Details  anatomy of codition; HEP; symptom management     Person(s) Educated  Patient    Methods  Explanation;Demonstration;Verbal cues;Tactile cues    Comprehension  Verbalized  understanding;Returned demonstration;Verbal cues required;Tactile cues required       PT Short Term Goals - 10/08/17 1218      PT SHORT TERM GOAL #1   Title  Patient will increase passive right hip flexion by 20 degrees     Time  4    Period  Weeks    Status  New    Target Date  11/05/17      PT SHORT TERM GOAL #2   Title  Patient will demsotrate 4/5 gross right lower extremity strength     Time  4    Period  Weeks    Status  New    Target Date  11/05/17      PT SHORT TERM GOAL #3   Title  Patient will ambaulte 200; with imroved single leg stance on the right and reduced trendelenburgh     Time  4    Period  Weeks    Status  New    Target Date  11/05/17      PT SHORT TERM GOAL #4   Title  Patient will be independent with initial HEP     Time  4    Period  Weeks    Status  New    Target Date  11/05/17        PT Long Term Goals - 10/08/17 1220      PT LONG TERM GOAL #1   Title  Patient will sit for 45 minutes without self report of pain     Time  8    Period  Weeks    Status  New    Target Date  12/03/17      PT LONG TERM GOAL #2   Title  Patient will bend to pick item off hte ground without pain     Time  8    Period  Weeks    Status  New    Target Date  12/03/17      PT LONG TERM GOAL #3   Title  Patient will stand for 30 hour without increased pain per self report     Time  8    Period  Weeks    Status  New    Target Date  12/03/17             Plan - 10/08/17 1205    Clinical Impression Statement  Patient is 57 year old male with right hip pain. He presents with symptoms of hip joint dysfunction as well as lateral hip bursitis. He has significant movement restrictions and strength restrictions in his left hip. He has an antalgic gait. He can not sit for more then 10 minutes without pain     History and Personal Factors relevant to plan of care:  CHF;     Clinical Presentation  Evolving    Clinical Decision Making  Moderate    Rehab Potential   Good    PT Frequency  2x / week  PT Duration  8 weeks    PT Treatment/Interventions  ADLs/Self Care Home Management;Cryotherapy;Electrical Stimulation;Ultrasound;Moist Heat;Iontophoresis 4mg /ml Dexamethasone;Gait training;Stair training;Therapeutic exercise;Therapeutic activities;Neuromuscular re-education;Patient/family education;Passive range of motion;Manual techniques;Dry needling;Taping    PT Next Visit Plan  Manual range of motion of the hip; Manual distraction if tolerated; roll out of glut and ITB; review HEP; cnsdeir nu-step     PT Home Exercise Plan  quad set; glut set ; heel slide     Consulted and Agree with Plan of Care  Patient       Patient will benefit from skilled therapeutic intervention in order to improve the following deficits and impairments:  Abnormal gait, Pain, Decreased mobility, Decreased endurance, Decreased activity tolerance, Difficulty walking, Decreased strength, Decreased range of motion  Visit Diagnosis: Pain in right hip - Plan: PT plan of care cert/re-cert  Weakness of right hip - Plan: PT plan of care cert/re-cert  Difficulty in walking, not elsewhere classified - Plan: PT plan of care cert/re-cert     Problem List Patient Active Problem List   Diagnosis Date Noted  . PVC's (premature ventricular contractions)   . Acute midline low back pain without sciatica 09/19/2016  . Acute gout 03/06/2016  . Left elbow pain 03/06/2016  . Heel spur 08/29/2015  . Nonsustained paroxysmal ventricular tachycardia ([place]) 01/26/2015  . Encounter for staple removal 08/23/2014  . HTN (hypertension) 03/02/2014  . Chronic combined systolic and diastolic CHF (congestive heart failure) ([place]) 03/02/2014  . Abnormal EKG 03/02/2014  . Hypertensive cardiomyopathy ([place]) 03/02/2014  . CKD (chronic kidney disease), stage III ([place]) 02/15/2014  . Immunization due 02/15/2014  . Proteinuria 02/15/2014  . Encounter to establish care 02/03/2014  . Neck nodule 02/01/2014  .  Eye redness 02/01/2014  . Mitral valve regurgitation 01/21/2014  . 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 Nocturia 01/19/2014  . Headache 01/19/2014  . T wave inversion in EKG 01/19/2014    Carney Living PT DPT  10/08/2017, 12:26 PM  [facility] [address] [place], [place], [address] Phone: [phone]   Fax:  [phone]  Name: Daniel Joseph MRN: [id number] Date of Birth: 05/08/1961

## 2017-10-14 ENCOUNTER — Other Ambulatory Visit: Payer: Self-pay | Admitting: Cardiology

## 2017-10-14 MED FILL — AMLODIPINE BESYLATE 10 MG T: 10 | 30 days supply | Qty: 30 | Fill #1

## 2017-10-14 MED FILL — ?FUROSEMIDE 40 MG TABLET: 40 | 30 days supply | Qty: 30 | Fill #3

## 2017-10-14 MED FILL — LABETALOL HCL 200 MG TABLET: 200 | 30 days supply | Qty: 60 | Fill #1

## 2017-10-14 MED FILL — POTASSIUM CL 10 MEQ TAB SA: 10 | 30 days supply | Qty: 30 | Fill #0

## 2017-10-14 MED FILL — ?CLONIDINE HCL 0.2 MG TABLE: 0.2 | 30 days supply | Qty: 30 | Fill #0

## 2017-10-15 ENCOUNTER — Ambulatory Visit: Payer: Self-pay | Admitting: Physical Therapy

## 2017-10-17 ENCOUNTER — Encounter: Payer: Self-pay | Admitting: Physical Therapy

## 2017-10-17 ENCOUNTER — Ambulatory Visit: Payer: Self-pay | Attending: Family Medicine | Admitting: Physical Therapy

## 2017-10-17 DIAGNOSIS — R29898 Other symptoms and signs involving the musculoskeletal system: Secondary | ICD-10-CM

## 2017-10-17 DIAGNOSIS — R262 Difficulty in walking, not elsewhere classified: Secondary | ICD-10-CM

## 2017-10-17 DIAGNOSIS — M25551 Pain in right hip: Secondary | ICD-10-CM

## 2017-10-17 NOTE — Therapy (Signed)
[place], [place], [address] Phone: [phone]   Fax:  [phone]  Physical Therapy Treatment  Patient Details  Name: Daniel Joseph MRN: [id number] Date of Birth: 11/07/1960 Referring Provider: Dr Cammie Sickle    Encounter Date: 10/17/2017  PT End of Session - 10/17/17 0851    Visit Number  2    Number of Visits  16    Date for PT Re-Evaluation  12/02/17    Authorization Type  CAFA 100%    PT Start Time  0845    PT Stop Time  0935    PT Time Calculation (min)  50 min       Past Medical History:  Diagnosis Date  . Benign essential HTN 01/19/2014   Renal Artery [place] 6/16:  No RAS, No AAA  . Cardiomyopathy, dilated, nonischemic (HCC)    EF 45-50% bye echo 08/2015  . Chronic combined systolic and diastolic CHF (congestive heart failure) ([place]) 01/21/2014  . Gout   . Mitral valve regurgitation    Moderate MR by echo 2018  . PVC's (premature ventricular contractions)    nonsustained VT as well as PVCs - no ICD indicated due to EF 40-45%  . 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 ABDOMINAL SURGERY  at birth  . TEE WITHOUT CARDIOVERSION N/A 01/25/2014   Procedure: TRANSESOPHAGEAL ECHOCARDIOGRAM (TEE);  Surgeon: [name], MD;  Location: [facility];  Service: Cardiovascular;  Laterality: N/A;  . TEE WITHOUT CARDIOVERSION N/A 09/14/2014   Procedure: TRANSESOPHAGEAL ECHOCARDIOGRAM (TEE);  Surgeon: Sueanne Margarita, MD;  Location: [facility] ENDOSCOPY;  Service: Cardiovascular;  Laterality: N/A;    There were no vitals filed for this visit.  Subjective Assessment - 10/17/17 0849    Subjective  I have problems in the morning, hip catches. HEP is helping.     Currently in Pain?  Yes    Pain Score  6     Pain Location  Hip    Pain Orientation  Right;Posterior;Lateral    Pain Descriptors / Indicators  Aching    Pain Type  Chronic pain     Aggravating Factors   first wake up, turning, sitting    Pain Relieving Factors  ice, exercises                      [facility] Adult PT Treatment/Exercise - 10/17/17 0001      Lumbar Exercises: Supine   Ab Set  10 reps      Knee/Hip Exercises: Stretches   Other Knee/Hip Stretches  knee  to chest 3 x 30 sec right       Knee/Hip Exercises: Aerobic   Nustep  Nustep L3 UE/LE x 5 min      Knee/Hip Exercises: Supine   Quad Sets  15 reps    Heel Slides  5 reps;AROM    Heel Slides Limitations  lateral knee pop every time he flexes with pain.     Bridges  10 reps small ROM    Other Supine Knee/Hip Exercises  pelvic tilt x 10       Knee/Hip Exercises: Sidelying   Clams  10 x 2     Other Sidelying Knee/Hip Exercises  reverse clam causes lateral knee pain       Manual Therapy   Manual Therapy  Soft tissue mobilization;Taping    Manual therapy comments  patella mobs inferior     McConnell  Trial of inferior pull tape              PT Education - 10/17/17 0950    Education provided  Yes    Education Details  HEP    Person(s) Educated  Patient    Methods  Explanation;Handout    Comprehension  Verbalized understanding       PT Short Term Goals - 10/08/17 1218      PT SHORT TERM GOAL #1   Title  Patient will increase passive right hip flexion by 20 degrees     Time  4    Period  Weeks    Status  New    Target Date  11/05/17      PT SHORT TERM GOAL #2   Title  Patient will demsotrate 4/5 gross right lower extremity strength     Time  4    Period  Weeks    Status  New    Target Date  11/05/17      PT SHORT TERM GOAL #3   Title  Patient will ambaulte 200; with imroved single leg stance on the right and reduced trendelenburgh     Time  4    Period  Weeks    Status  New    Target Date  11/05/17      PT SHORT TERM GOAL #4   Title  Patient will be independent with initial HEP     Time  4    Period  Weeks    Status  New    Target Date  11/05/17         PT Long Term Goals - 10/08/17 1220      PT LONG TERM GOAL #1   Title  Patient will sit for 45 minutes without self report of pain     Time  8    Period  Weeks    Status  New    Target Date  12/03/17      PT LONG TERM GOAL #2   Title  Patient will bend to pick item off hte ground without pain     Time  8    Period  Weeks    Status  New    Target Date  12/03/17      PT LONG TERM GOAL #3   Title  Patient will stand for 30 hour without increased pain per self report     Time  8    Period  Weeks    Status  New    Target Date  12/03/17            Plan - 10/17/17 4709    Clinical Impression Statement  Bilateral patella alta with painful lateral right knee pop everytime he flexes into a heel slide. Inferior patella manual pull, abolishes pop with knee flexion.  Began soft tissue work to right gluteals with pt reporting decreased pain afterward. Began hip/pelvic mobility exercises with pt reporting back pain with posterior pelvic tilts. Better with abdominal draw in.     PT Next Visit Plan  Manual range of motion of the hip; Manual distraction if tolerated; roll out of glut and ITB; review HEP; cnsdeir nu-step     PT Home Exercise Plan  quad set; glut set ; heel slide, supine clam    Consulted and Agree with Plan of Care  Patient       Patient will benefit from skilled therapeutic  intervention in order to improve the following deficits and impairments:  Abnormal gait, Pain, Decreased mobility, Decreased endurance, Decreased activity tolerance, Difficulty walking, Decreased strength, Decreased range of motion  Visit Diagnosis: Pain in right hip  Weakness of right hip  Difficulty in walking, not elsewhere classified     Problem List Patient Active Problem List   Diagnosis Date Noted  . PVC's (premature ventricular contractions)   . Acute midline low back pain without sciatica 09/19/2016  . Acute gout 03/06/2016  . Left elbow pain 03/06/2016  . Heel spur  08/29/2015  . Nonsustained paroxysmal ventricular tachycardia ([place]) 01/26/2015  . Encounter for staple removal 08/23/2014  . HTN (hypertension) 03/02/2014  . Chronic combined systolic and diastolic CHF (congestive heart failure) ([place]) 03/02/2014  . Abnormal EKG 03/02/2014  . Hypertensive cardiomyopathy ([place]) 03/02/2014  . CKD (chronic kidney disease), stage III ([place]) 02/15/2014  . Immunization due 02/15/2014  . Proteinuria 02/15/2014  . Encounter to establish care 02/03/2014  . Neck nodule 02/01/2014  . Eye redness 02/01/2014  . Mitral valve regurgitation 01/21/2014  . 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 Nocturia 01/19/2014  . Headache 01/19/2014  . T wave inversion in EKG 01/19/2014    Dorene Ar, [place] 10/17/2017, 9:58 AM  [place], [place], [address] Phone: [phone]   Fax:  [phone]  Name: MACKINLEY KIEHN MRN: [id number] Date of Birth: [birth year]

## 2017-10-17 NOTE — Patient Instructions (Signed)
External Rotation: Hip - Knees Apart (Hook-Lying)    Lie with hips and knees bent, band tied just above knees. Pull knees apart. Hold for _5__ seconds.  Repeat _10-20__ times. Do __2_ times a day.

## 2017-10-20 ENCOUNTER — Ambulatory Visit: Payer: Self-pay | Admitting: Physical Therapy

## 2017-10-20 ENCOUNTER — Encounter: Payer: Self-pay | Admitting: Physical Therapy

## 2017-10-20 DIAGNOSIS — M25551 Pain in right hip: Secondary | ICD-10-CM

## 2017-10-20 DIAGNOSIS — R29898 Other symptoms and signs involving the musculoskeletal system: Secondary | ICD-10-CM

## 2017-10-20 DIAGNOSIS — R262 Difficulty in walking, not elsewhere classified: Secondary | ICD-10-CM

## 2017-10-20 NOTE — Patient Instructions (Signed)
Knee High   Holding stable object, raise knee to hip level, then lower knee. Repeat with other knee. Complete __10_ repetitions. Do __2__ sessions per day.  ABDUCTION: Standing (Active)   Stand, feet flat. Lift right leg out to side. Use _0__ lbs. Complete __10_ repetitions. Perform __2_ sessions per day.  ADDUCTION: Standing - Stable (Active)   Stand, right leg out to side as far as possible. Draw leg in across midline. Use _0__ lbs. Complete 10_ repetitions. Perform _2__ sessions per day.       EXTENSION: Standing (Active)  Stand, both feet flat. Draw right leg behind body as far as possible. Use 0___ lbs. Complete 10 repetitions. Perform __2_ sessions per day.  Copyright  VHI. All rights reserved.

## 2017-10-20 NOTE — Therapy (Signed)
[place], [place], [address] Phone: [phone]   Fax:  [phone]  Physical Therapy Treatment  Patient Details  Name: Daniel Joseph MRN: [id number] Date of Birth: 22-Apr-1961 Referring Provider: Dr Cammie Sickle    Encounter Date: 10/20/2017  PT End of Session - 10/20/17 0857    Visit Number  3    Number of Visits  16    Date for PT Re-Evaluation  12/02/17    Authorization Type  CAFA 100%    PT Start Time  0849    PT Stop Time  0933    PT Time Calculation (min)  44 min       Past Medical History:  Diagnosis Date  . Benign essential HTN 01/19/2014   Renal Artery [place] 6/16:  No RAS, No AAA  . Cardiomyopathy, dilated, nonischemic (HCC)    EF 45-50% bye echo 08/2015  . Chronic combined systolic and diastolic CHF (congestive heart failure) ([place]) 01/21/2014  . Gout   . Mitral valve regurgitation    Moderate MR by echo 2018  . PVC's (premature ventricular contractions)    nonsustained VT as well as PVCs - no ICD indicated due to EF 40-45%  . 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 ABDOMINAL SURGERY  at birth  . TEE WITHOUT CARDIOVERSION N/A 01/25/2014   Procedure: TRANSESOPHAGEAL ECHOCARDIOGRAM (TEE);  Surgeon: [name], MD;  Location: [place];  Service: Cardiovascular;  Laterality: N/A;  . TEE WITHOUT CARDIOVERSION N/A 09/14/2014   Procedure: TRANSESOPHAGEAL ECHOCARDIOGRAM (TEE);  Surgeon: Sueanne Margarita, MD;  Location: [facility] ENDOSCOPY;  Service: Cardiovascular;  Laterality: N/A;    There were no vitals filed for this visit.  Subjective Assessment - 10/20/17 0905    Subjective  Tape may have helped the knee for a couple of hours.     Currently in Pain?  Yes    Pain Score  5     Pain Location  Hip    Pain Orientation  Right;Lateral;Posterior    Pain Descriptors / Indicators  Aching                       [facility] Adult PT Treatment/Exercise - 10/20/17 0001      Knee/Hip Exercises: Stretches   Other Knee/Hip Stretches  knee  to chest 3 x 30 sec both      Knee/Hip Exercises: Aerobic   Nustep  Nustep L5 UE/LE x 5 min      Knee/Hip Exercises: Standing   Other Standing Knee Exercises  4 way hip at counter 2 x 10 each bilateral       Knee/Hip Exercises: Supine   Other Supine Knee/Hip Exercises  supine clam red band x 20 - slow eccentric control     Other Supine Knee/Hip Exercises  pelvic tilt x 10       Knee/Hip Exercises: Sidelying   Clams  15x 2     Other Sidelying Knee/Hip Exercises  reverse clam x15 x2       Manual Therapy   Manual Therapy  Soft tissue mobilization    Manual therapy comments  patella mobs inferior     Soft tissue mobilization  lateral hip tennis ball - non tender today    McConnell  patella inferior pull tape              PT Education - 10/20/17  0913    Education provided  Yes    Education Details  HEP    Person(s) Educated  Patient    Methods  Explanation;Handout    Comprehension  Verbalized understanding       PT Short Term Goals - 10/08/17 1218      PT SHORT TERM GOAL #1   Title  Patient will increase passive right hip flexion by 20 degrees     Time  4    Period  Weeks    Status  New    Target Date  11/05/17      PT SHORT TERM GOAL #2   Title  Patient will demsotrate 4/5 gross right lower extremity strength     Time  4    Period  Weeks    Status  New    Target Date  11/05/17      PT SHORT TERM GOAL #3   Title  Patient will ambaulte 200; with imroved single leg stance on the right and reduced trendelenburgh     Time  4    Period  Weeks    Status  New    Target Date  11/05/17      PT SHORT TERM GOAL #4   Title  Patient will be independent with initial HEP     Time  4    Period  Weeks    Status  New    Target Date  11/05/17        PT Long Term Goals - 10/08/17 1220      PT LONG TERM GOAL #1    Title  Patient will sit for 45 minutes without self report of pain     Time  8    Period  Weeks    Status  New    Target Date  12/03/17      PT LONG TERM GOAL #2   Title  Patient will bend to pick item off hte ground without pain     Time  8    Period  Weeks    Status  New    Target Date  12/03/17      PT LONG TERM GOAL #3   Title  Patient will stand for 30 hour without increased pain per self report     Time  8    Period  Weeks    Status  New    Target Date  12/03/17            Plan - 10/20/17 0912    Clinical Impression Statement  Pt reports continued right knee pop with knee flexion. Began 4 way standing hip with good tolerance. Updated HEP. Repeated inferior patella mobs and applied inferior patella pull tape. Non tender to lateral hip today.     PT Next Visit Plan  Manual range of motion of the hip; Manual distraction if tolerated; roll out of glut and ITB; review HEP;     PT Home Exercise Plan  quad set; glut set ; heel slide, supine clam, 4 way hip standing    Consulted and Agree with Plan of Care  Patient       Patient will benefit from skilled therapeutic intervention in order to improve the following deficits and impairments:  Abnormal gait, Pain, Decreased mobility, Decreased endurance, Decreased activity tolerance, Difficulty walking, Decreased strength, Decreased range of motion  Visit Diagnosis: Pain in right hip  Weakness of right hip  Difficulty in walking, not elsewhere classified  Problem List Patient Active Problem List   Diagnosis Date Noted  . PVC's (premature ventricular contractions)   . Acute midline low back pain without sciatica 09/19/2016  . Acute gout 03/06/2016  . Left elbow pain 03/06/2016  . Heel spur 08/29/2015  . Nonsustained paroxysmal ventricular tachycardia ([place]) 01/26/2015  . Encounter for staple removal 08/23/2014  . HTN (hypertension) 03/02/2014  . Chronic combined systolic and diastolic CHF (congestive heart failure)  ([place]) 03/02/2014  . Abnormal EKG 03/02/2014  . Hypertensive cardiomyopathy ([place]) 03/02/2014  . CKD (chronic kidney disease), stage III ([place]) 02/15/2014  . Immunization due 02/15/2014  . Proteinuria 02/15/2014  . Encounter to establish care 02/03/2014  . Neck nodule 02/01/2014  . Eye redness 02/01/2014  . Mitral valve regurgitation 01/21/2014  . 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 Nocturia 01/19/2014  . Headache 01/19/2014  . T wave inversion in EKG 01/19/2014    Dorene Ar, [place] 10/20/2017, 9:44 AM  [facility] [address] [place], [place], [address] Phone: [phone]   Fax:  [phone]  Name: Daniel Joseph MRN: [id number] Date of Birth: 1961-07-21

## 2017-10-22 ENCOUNTER — Ambulatory Visit: Payer: Self-pay | Admitting: Physical Therapy

## 2017-10-23 MED FILL — POTASSIUM CL 10 MEQ TAB SA: 10 | 30 days supply | Qty: 30 | Fill #1

## 2017-10-23 MED FILL — ?FUROSEMIDE 40 MG TABLET: 40 | 30 days supply | Qty: 30 | Fill #4

## 2017-10-23 MED FILL — LABETALOL HCL 200 MG TABLET: 200 | 30 days supply | Qty: 60 | Fill #2

## 2017-10-23 MED FILL — AMLODIPINE BESYLATE 10 MG T: 10 | 30 days supply | Qty: 30 | Fill #2

## 2017-10-23 MED FILL — ?CLONIDINE HCL 0.2 MG TABLE: 0.2 | 30 days supply | Qty: 30 | Fill #1

## 2017-10-27 ENCOUNTER — Encounter: Payer: Self-pay | Admitting: Physical Therapy

## 2017-10-27 ENCOUNTER — Ambulatory Visit: Payer: Self-pay | Admitting: Physical Therapy

## 2017-10-27 DIAGNOSIS — R262 Difficulty in walking, not elsewhere classified: Secondary | ICD-10-CM

## 2017-10-27 DIAGNOSIS — M25551 Pain in right hip: Secondary | ICD-10-CM

## 2017-10-27 DIAGNOSIS — R29898 Other symptoms and signs involving the musculoskeletal system: Secondary | ICD-10-CM

## 2017-10-27 NOTE — Therapy (Signed)
[place], [place], [address] Phone: [phone]   Fax:  [phone]  Physical Therapy Treatment  Patient Details  Name: Daniel Joseph MRN: [id number] Date of Birth: 10/04/1960 Referring Provider: Dr Cammie Sickle    Encounter Date: 10/27/2017  PT End of Session - 10/27/17 0917    Visit Number  4    Number of Visits  16    Date for PT Re-Evaluation  12/02/17    Authorization Type  CAFA 100%    PT Start Time  0905 23 minutes late     PT Stop Time  0948    PT Time Calculation (min)  43 min       Past Medical History:  Diagnosis Date  . Benign essential HTN 01/19/2014   Renal Artery [place] 6/16:  No RAS, No AAA  . Cardiomyopathy, dilated, nonischemic (HCC)    EF 45-50% bye echo 08/2015  . Chronic combined systolic and diastolic CHF (congestive heart failure) ([place]) 01/21/2014  . Gout   . Mitral valve regurgitation    Moderate MR by echo 2018  . PVC's (premature ventricular contractions)    nonsustained VT as well as PVCs - no ICD indicated due to EF 40-45%  . 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 ABDOMINAL SURGERY  at birth  . TEE WITHOUT CARDIOVERSION N/A 01/25/2014   Procedure: TRANSESOPHAGEAL ECHOCARDIOGRAM (TEE);  Surgeon: [name], MD;  Location: [facility];  Service: Cardiovascular;  Laterality: N/A;  . TEE WITHOUT CARDIOVERSION N/A 09/14/2014   Procedure: TRANSESOPHAGEAL ECHOCARDIOGRAM (TEE);  Surgeon: Sueanne Margarita, MD;  Location: [facility] ENDOSCOPY;  Service: Cardiovascular;  Laterality: N/A;    There were no vitals filed for this visit.  Subjective Assessment - 10/27/17 0912    Subjective  Hip is better.     Currently in Pain?  No/denies         [facility] PT Assessment - 10/27/17 0001      AROM   Right Hip Flexion  115      PROM   Overall PROM Comments  Right hip flexion 130    PROM Assessment Site  --    Right/Left Hip  Right;Left      Strength   Right Hip Flexion  4+/5    Right Hip ABduction  4+/5    Right Hip ADduction  4/5                  [facility] Adult PT Treatment/Exercise - 10/27/17 0001      Ambulation/Gait   Ambulation/Gait  Yes    Ambulation/Gait Assistance  7: Independent    Ambulation Distance (Feet)  200 Feet    Assistive device  None    Gait Comments  min gait deviations      Knee/Hip Exercises: Stretches   Other Knee/Hip Stretches  knee  to chest 3 x 30 sec both      Knee/Hip Exercises: Aerobic   Nustep  Nustep L5 UE/LE x 5 min      Knee/Hip Exercises: Machines for Strengthening   Total Gym Leg Press  55# bilateral. 25# single       Knee/Hip Exercises: Standing   Wall Squat  15 reps no knee popping if ball between knees     Other Standing Knee Exercises  4 way hip at counter 2 x 10 each bilateral  Knee/Hip Exercises: Supine   Bridges with Ball Squeeze  15 reps    Other Supine Knee/Hip Exercises  supine clam blue  band x 20 - slow eccentric control     Other Supine Knee/Hip Exercises  abdominal draw in with marching       Knee/Hip Exercises: Sidelying   Clams  blue band x 15                PT Short Term Goals - 10/27/17 0814      PT SHORT TERM GOAL #1   Title  Patient will increase passive right hip flexion by 20 degrees     Time  4    Period  Weeks    Status  Achieved      PT SHORT TERM GOAL #2   Title  Patient will demsotrate 4/5 gross right lower extremity strength     Time  4    Period  Weeks    Status  Achieved      PT SHORT TERM GOAL #3   Title  Patient will ambaulte 200; with imroved single leg stance on the right and reduced trendelenburgh     Time  4    Period  Weeks    Status  Achieved        PT Long Term Goals - 10/08/17 1220      PT LONG TERM GOAL #1   Title  Patient will sit for 45 minutes without self report of pain     Time  8    Period  Weeks    Status  New    [place] Date  12/03/17      PT LONG  TERM GOAL #2   Title  Patient will bend to pick item off hte ground without pain     Time  8    Period  Weeks    Status  New    Target Date  12/03/17      PT LONG TERM GOAL #3   Title  Patient will stand for 30 hour without increased pain per self report     Time  8    Period  Weeks    Status  New    Target Date  12/03/17            Plan - 10/27/17 0912    Clinical Impression Statement  Pt arrives 23 minutes late. Able to work him into a cancelation. Hip stiffness in the morning improved. Sitting tolerance improved to 20 minutes. Continued knee popping. Able to ambulate around clinic with improved right stance time and decreased trendelenberg. MMT and ROM significantly improved. All STGs met.     PT Next Visit Plan  Manual range of motion of the hip; Manual distraction if tolerated; roll out of glut and ITB; review HEP;     PT Home Exercise Plan  quad set; glut set ; heel slide, supine clam, 4 way hip standing    Consulted and Agree with Plan of Care  Patient       Patient will benefit from skilled therapeutic intervention in order to improve the following deficits and impairments:  Abnormal gait, Pain, Decreased mobility, Decreased endurance, Decreased activity tolerance, Difficulty walking, Decreased strength, Decreased range of motion  Visit Diagnosis: Pain in right hip  Weakness of right hip  Difficulty in walking, not elsewhere classified     Problem List Patient Active Problem List   Diagnosis Date Noted  . PVC's (premature ventricular contractions)   .  Acute midline low back pain without sciatica 09/19/2016  . Acute gout 03/06/2016  . Left elbow pain 03/06/2016  . Heel spur 08/29/2015  . Nonsustained paroxysmal ventricular tachycardia ([place]) 01/26/2015  . Encounter for staple removal 08/23/2014  . HTN (hypertension) 03/02/2014  . Chronic combined systolic and diastolic CHF (congestive heart failure) ([place]) 03/02/2014  . Abnormal EKG 03/02/2014  .  Hypertensive cardiomyopathy ([place]) 03/02/2014  . CKD (chronic kidney disease), stage III ([place]) 02/15/2014  . Immunization due 02/15/2014  . Proteinuria 02/15/2014  . Encounter to establish care 02/03/2014  . Neck nodule 02/01/2014  . Eye redness 02/01/2014  . Mitral valve regurgitation 01/21/2014  . 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 Nocturia 01/19/2014  . Headache 01/19/2014  . T wave inversion in EKG 01/19/2014    Dorene Ar, [place] 10/27/2017, 10:22 AM  [place], [place], [address] Phone: [phone]   Fax:  [phone]  Name: Daniel Joseph MRN: [id number] Date of Birth: 12-29-1960

## 2017-10-29 ENCOUNTER — Ambulatory Visit: Payer: Self-pay | Admitting: Physical Therapy

## 2017-11-03 ENCOUNTER — Other Ambulatory Visit: Payer: Self-pay

## 2017-11-03 ENCOUNTER — Ambulatory Visit (HOSPITAL_COMMUNITY): Payer: Self-pay | Attending: Cardiovascular Disease

## 2017-11-03 ENCOUNTER — Ambulatory Visit (AMBULATORY_SURGERY_CENTER): Payer: Self-pay | Admitting: *Deleted

## 2017-11-03 ENCOUNTER — Ambulatory Visit: Payer: Self-pay | Admitting: Physical Therapy

## 2017-11-03 VITALS — Ht 73.0 in | Wt 235.0 lb

## 2017-11-03 DIAGNOSIS — I340 Nonrheumatic mitral (valve) insufficiency: Secondary | ICD-10-CM

## 2017-11-03 DIAGNOSIS — I420 Dilated cardiomyopathy: Secondary | ICD-10-CM | POA: Insufficient documentation

## 2017-11-03 DIAGNOSIS — Z1211 Encounter for screening for malignant neoplasm of colon: Secondary | ICD-10-CM

## 2017-11-03 DIAGNOSIS — I058 Other rheumatic mitral valve diseases: Secondary | ICD-10-CM | POA: Insufficient documentation

## 2017-11-03 MED ORDER — NA SULFATE-K SULFATE-MG SULF 17.5-3.13-1.6 GM/177ML PO SOLN: ORAL | 0 refills | Status: DC

## 2017-11-03 MED FILL — SUPREP BOWEL PREP KIT: 17.5-3.13-1 | 1 days supply | Qty: 354 | Fill #0

## 2017-11-03 MED FILL — ?SPIRONOLACTONE 25 MG TABLE: 25 | 30 days supply | Qty: 30 | Fill #2

## 2017-11-03 MED FILL — ?CETIRIZINE HCL 10 MG TABLE: 10 | 30 days supply | Qty: 30 | Fill #1

## 2017-11-03 NOTE — Progress Notes (Signed)
Patient denies any allergies to eggs or soy. Patient denies any problems with anesthesia/sedation. Patient denies any oxygen use at home. Patient denies taking any diet/weight loss medications or blood thinners. EMMI education assisgned to patient on colonoscopy, this was explained and instructions given to patient. 

## 2017-11-04 ENCOUNTER — Ambulatory Visit: Payer: Self-pay | Attending: Internal Medicine

## 2017-11-05 ENCOUNTER — Ambulatory Visit: Payer: Self-pay | Admitting: Physical Therapy

## 2017-11-05 ENCOUNTER — Encounter: Payer: Self-pay | Admitting: Physical Therapy

## 2017-11-05 ENCOUNTER — Telehealth: Payer: Self-pay

## 2017-11-05 DIAGNOSIS — I340 Nonrheumatic mitral (valve) insufficiency: Secondary | ICD-10-CM

## 2017-11-05 DIAGNOSIS — M25551 Pain in right hip: Secondary | ICD-10-CM

## 2017-11-05 DIAGNOSIS — R262 Difficulty in walking, not elsewhere classified: Secondary | ICD-10-CM

## 2017-11-05 DIAGNOSIS — R29898 Other symptoms and signs involving the musculoskeletal system: Secondary | ICD-10-CM

## 2017-11-05 NOTE — Telephone Encounter (Signed)
Notes recorded by Teressa Senter, RN on 11/05/2017 at 12:41 PM EST Patient made aware of echo results. Patient verbalized understanding and thankful for the call.  Echo ordered to be scheduled in a year.  Notes recorded by Sueanne Margarita, MD on 11/03/2017 at 8:01 PM EST Echo showed low normal LVF with EF 50-55% with HK of the basal to mid inferior and inferoseptal myocardium, mild late MVP of the AMVL with mild to moderate MR and severe LAE. Please repeat echo in 1 year.

## 2017-11-05 NOTE — Therapy (Addendum)
[place], [place], [address] Phone: [phone]   Fax:  [phone]  Physical Therapy Treatment/ Discharge   Patient Details  Name: Daniel Joseph MRN: [id number] Date of Birth: 1960-11-07 Referring Provider: Dr Cammie Sickle    Encounter Date: 11/05/2017  PT End of Session - 11/05/17 0805    Visit Number  5    Number of Visits  16    Date for PT Re-Evaluation  12/02/17    Authorization Type  CAFA 100%    PT Start Time  0800    PT Stop Time  0838    PT Time Calculation (min)  38 min       Past Medical History:  Diagnosis Date  . Benign essential HTN 01/19/2014   Renal Artery [place] 6/16:  No RAS, No AAA  . Cardiomyopathy, dilated, nonischemic (HCC)    EF 45-50% bye echo 08/2015 and EF 50-55% by echo 10/2017  . Chronic combined systolic and diastolic CHF (congestive heart failure) ([place]) 01/21/2014  . Gout   . Mitral valve regurgitation    mild to moderate MR with MVP of ANMVL by echo 2019  . PVC's (premature ventricular contractions)    nonsustained VT as well as PVCs - no ICD indicated due to EF 40-45%  . 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 ABDOMINAL SURGERY  at birth  . TEE WITHOUT CARDIOVERSION N/A 01/25/2014   Procedure: TRANSESOPHAGEAL ECHOCARDIOGRAM (TEE);  Surgeon: [name], MD;  Location: [facility];  Service: Cardiovascular;  Laterality: N/A;  . TEE WITHOUT CARDIOVERSION N/A 09/14/2014   Procedure: TRANSESOPHAGEAL ECHOCARDIOGRAM (TEE);  Surgeon: Sueanne Margarita, MD;  Location: [facility] ENDOSCOPY;  Service: Cardiovascular;  Laterality: N/A;    There were no vitals filed for this visit.  Subjective Assessment - 11/05/17 0806    Subjective  Hip is getting better. Mornings are better. Knee is still popping.     Currently in Pain?  No/denies         [facility] PT Assessment - 11/05/17 0001      Observation/Other  Assessments   Focus on Therapeutic Outcomes (FOTO)   46% limited improved from 77% limited       AROM   Right Hip Flexion  115      PROM   Overall PROM Comments  Right hip flexion 130      Strength   Right Hip Flexion  4+/5    Right Hip ABduction  4+/5    Right Hip ADduction  4+/5    Left Hip Flexion  4+/5    Left Hip ABduction  4+/5    Left Hip ADduction  4+/5                  [facility] Adult PT Treatment/Exercise - 11/05/17 0001      Knee/Hip Exercises: Stretches   Active Hamstring Stretch  2 reps;30 seconds    Other Knee/Hip Stretches  knee  to chest 3 x 30 sec both      Knee/Hip Exercises: Aerobic   Nustep  L6 LE only x 5 min      Knee/Hip Exercises: Machines for Strengthening   Total Gym Leg Press  55# bilateral.35# single       Knee/Hip Exercises: Standing   Functional Squat  20 reps    Wall Squat  15 reps no knee popping, 1 set with ball, no  popping    SLS  25 seconds right, 45 left     Other Standing Knee Exercises  single leg squat with hip hinge x 10 at counter       Knee/Hip Exercises: Supine   Straight Leg Raises  10 reps    Straight Leg Raise with External Rotation  10 reps      Knee/Hip Exercises: Sidelying   Hip ABduction  20 reps    Clams  blue band x 15              PT Education - 11/05/17 0841    Education provided  Yes    Education Details  Comprehensive HEP    Person(s) Educated  Patient    Methods  Explanation;Handout    Comprehension  Verbalized understanding       PT Short Term Goals - 10/27/17 0922      PT SHORT TERM GOAL #1   Title  Patient will increase passive right hip flexion by 20 degrees     Time  4    Period  Weeks    Status  Achieved      PT SHORT TERM GOAL #2   Title  Patient will demsotrate 4/5 gross right lower extremity strength     Time  4    Period  Weeks    Status  Achieved      PT SHORT TERM GOAL #3   Title  Patient will ambaulte 200; with imroved single leg stance on the right and reduced  trendelenburgh     Time  4    Period  Weeks    Status  Achieved        PT Long Term Goals - 11/05/17 0805      PT LONG TERM GOAL #1   Title  Patient will sit for 45 minutes without self report of pain     Status  Achieved      PT LONG TERM GOAL #2   Title  Patient will bend to pick item off hte ground without pain     Status  Achieved      PT LONG TERM GOAL #3   Title  Patient will stand for 30 hour without increased pain per self report     Status  Achieved            Plan - 11/05/17 0836    Clinical Impression Statement  Pt reports hip continues to improve. Decreased morning stiffness. Improved tolerance to sitting and standing > 1 hour. AROM and strength all significantly improved. All Goals met.     PT Next Visit Plan  Discharge today    PT Home Exercise Plan  quad set; glut set ; heel slide, supine clam, 4 way hip standing, wall slide, squat, bridge, SLR, Hip abduction, side clam blue band, hamstring stretch,  knee to chest stretch    Consulted and Agree with Plan of Care  Patient       Patient will benefit from skilled therapeutic intervention in order to improve the following deficits and impairments:  Abnormal gait, Pain, Decreased mobility, Decreased endurance, Decreased activity tolerance, Difficulty walking, Decreased strength, Decreased range of motion  Visit Diagnosis: Pain in right hip  Weakness of right hip  Difficulty in walking, not elsewhere classified  PHYSICAL THERAPY DISCHARGE SUMMARY  Visits from Start of Care:5  Current functional level related to goals / functional outcomes: improved hip pain    Remaining deficits: Continues to have clunking in the  knee    Education / Equipment: HEP  Plan: Patient agrees to discharge.  Patient goals were not met. Patient is being discharged due to meeting the stated rehab goals.  ?????       Problem List Patient Active Problem List   Diagnosis Date Noted  . PVC's (premature ventricular  contractions)   . Acute midline low back pain without sciatica 09/19/2016  . Acute gout 03/06/2016  . Left elbow pain 03/06/2016  . Heel spur 08/29/2015  . Nonsustained paroxysmal ventricular tachycardia ([place]) 01/26/2015  . Encounter for staple removal 08/23/2014  . HTN (hypertension) 03/02/2014  . Chronic combined systolic and diastolic CHF (congestive heart failure) ([place]) 03/02/2014  . Abnormal EKG 03/02/2014  . Hypertensive cardiomyopathy ([place]) 03/02/2014  . CKD (chronic kidney disease), stage III ([place]) 02/15/2014  . Immunization due 02/15/2014  . Proteinuria 02/15/2014  . Encounter to establish care 02/03/2014  . Neck nodule 02/01/2014  . Eye redness 02/01/2014  . Mitral valve regurgitation 01/21/2014  . 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 Nocturia 01/19/2014  . Headache 01/19/2014  . T wave inversion in EKG 01/19/2014   Carolyne Littles PT DPT  11/05/2017 4:04 PM  Dorene Ar, PTA 11/05/2017, 8:45 AM  [facility] [address] [place], [place], [address] Phone: [phone]   Fax:  [phone]  Name: Daniel Joseph MRN: [id number] Date of Birth: 1960-12-04

## 2017-11-12 ENCOUNTER — Other Ambulatory Visit: Payer: Self-pay

## 2017-11-12 ENCOUNTER — Ambulatory Visit (AMBULATORY_SURGERY_CENTER): Payer: Self-pay | Admitting: Gastroenterology

## 2017-11-12 ENCOUNTER — Encounter: Payer: Self-pay | Admitting: Gastroenterology

## 2017-11-12 VITALS — BP 136/82 | HR 62 | Temp 97.5°F | Resp 17 | Ht 73.0 in | Wt 235.0 lb

## 2017-11-12 DIAGNOSIS — Z538 Procedure and treatment not carried out for other reasons: Secondary | ICD-10-CM

## 2017-11-12 DIAGNOSIS — Z1212 Encounter for screening for malignant neoplasm of rectum: Secondary | ICD-10-CM

## 2017-11-12 DIAGNOSIS — Z1211 Encounter for screening for malignant neoplasm of colon: Secondary | ICD-10-CM

## 2017-11-12 MED ORDER — SODIUM CHLORIDE 0.9 % IV SOLN: 500.0000 mL | Freq: Once | INTRAVENOUS | Status: DC

## 2017-11-12 NOTE — Op Note (Signed)
[facility] Patient Name: Brown Dunlap Procedure Date: 11/12/2017 11:09 AM MRN: [id number] Endoscopist: Amoret. Loletha Carrow , MD Age: 57 Referring MD:  Date of Birth: Jul 02, 1961 Gender: Male Account #: [id number] Procedure:                Colonoscopy Indications:              Screening for colorectal malignant neoplasm, This                            is the patient's first colonoscopy Medicines:                Monitored Anesthesia Care Procedure:                Pre-Anesthesia Assessment:                           - Prior to the procedure, a History and Physical                            was performed, and patient medications and                            allergies were reviewed. The patient's tolerance of                            previous anesthesia was also reviewed. The risks                            and benefits of the procedure and the sedation                            options and risks were discussed with the patient.                            All questions were answered, and informed consent                            was obtained. Anticoagulants: The patient has taken                            aspirin. It was decided not to withhold this                            medication prior to the procedure. ASA Grade                            Assessment: III - A patient with severe systemic                            disease. After reviewing the risks and benefits,                            the patient was deemed in satisfactory condition to  undergo the procedure.                           After obtaining informed consent, the colonoscope                            was passed under direct vision. Throughout the                            procedure, the patient's blood pressure, pulse, and                            oxygen saturations were monitored continuously. The                            Colonoscope was introduced through the anus and                             advanced to the the cecum, identified by                            appendiceal orifice and ileocecal valve. The                            colonoscopy was performed without difficulty. The                            patient tolerated the procedure well. The quality                            of the bowel preparation was poor. The ileocecal                            valve, appendiceal orifice, and rectum were                            photographed. The quality of the bowel preparation                            was evaluated using the BBPS [facility] Bowel                            Preparation Scale) with scores of: Right Colon = 1,                            Transverse Colon = 1 and Left Colon = 1. The total                            BBPS score equals 3. The bowel preparation used was                            SUPREP. Scope In: 11:19:45 AM Scope Out: 11:31:52 AM Scope Withdrawal Time: 0 hours 9 minutes 13 seconds  Total Procedure Duration:  0 hours 12 minutes 7 seconds  Findings:                 The perianal exam findings include decreased anal                            shincter tone with prolapse of anal tissue.                           A large amount of semi-liquid stool was found in                            the entire colon, interfering with visualization.                            Lavage of the area was performed using a large                            amount, resulting in incomplete clearance with fair                            visualization.                           Retroflexion in the rectum was not performed due to                            anatomy.                           The exam was otherwise without abnormality. Complications:            No immediate complications. Estimated Blood Loss:     Estimated blood loss: none. Impression:               - Preparation of the colon was poor.                           - Decreased anal shincter tone  with prolapse of                            anal tissue found on perianal exam.                           - Stool in the entire examined colon.                           - The examination was otherwise normal.                           - No specimens collected. Recommendation:           - Patient has a contact number available for                            emergencies. The signs and symptoms of potential  delayed complications were discussed with the                            patient. Return to normal activities tomorrow.                            Written discharge instructions were provided to the                            patient.                           - Resume previous diet.                           - Continue present medications.                           - Repeat colonoscopy in 1 year (with 2-day prep)                            for screening purposes because of inadeauate bowel                            preparation. Lavon Horn L. Loletha Carrow, MD 11/12/2017 11:38:21 AM This report has been signed electronically.

## 2017-11-12 NOTE — Progress Notes (Signed)
Pt's states no medical or surgical changes since previsit or office visit. 

## 2017-11-12 NOTE — Progress Notes (Signed)
Report given to PACU, vss 

## 2017-11-12 NOTE — Patient Instructions (Signed)
**   Repeat Colonoscopy in one year due to poor prep **  YOU HAD AN ENDOSCOPIC PROCEDURE TODAY AT [facility]:   Refer to the procedure report that was given to you for any specific questions about what was found during the examination.  If the procedure report does not answer your questions, please call your gastroenterologist to clarify.  If you requested that your care partner not be given the details of your procedure findings, then the procedure report has been included in a sealed envelope for you to review at your convenience later.  YOU SHOULD EXPECT: Some feelings of bloating in the abdomen. Passage of more gas than usual.  Walking can help get rid of the air that was put into your GI tract during the procedure and reduce the bloating. If you had a lower endoscopy (such as a colonoscopy or flexible sigmoidoscopy) you may notice spotting of blood in your stool or on the toilet paper. If you underwent a bowel prep for your procedure, you may not have a normal bowel movement for a few days.  Please Note:  You might notice some irritation and congestion in your nose or some drainage.  This is from the oxygen used during your procedure.  There is no need for concern and it should clear up in a day or so.  SYMPTOMS TO REPORT IMMEDIATELY:   Following lower endoscopy (colonoscopy or flexible sigmoidoscopy):  Excessive amounts of blood in the stool  Significant tenderness or worsening of abdominal pains  Swelling of the abdomen that is new, acute  Fever of 100F or higher  For urgent or emergent issues, a gastroenterologist can be reached at any hour by calling [phone].   DIET:  We do recommend a small meal at first, but then you may proceed to your regular diet.  Drink plenty of fluids but you should avoid alcoholic beverages for 24 hours.  ACTIVITY:  You should plan to take it easy for the rest of today and you should NOT DRIVE or use heavy machinery until tomorrow  (because of the sedation medicines used during the test).    FOLLOW UP: Our staff will call the number listed on your records the next business day following your procedure to check on you and address any questions or concerns that you may have regarding the information given to you following your procedure. If we do not reach you, we will leave a message.  However, if you are feeling well and you are not experiencing any problems, there is no need to return our call.  We will assume that you have returned to your regular daily activities without incident.  If any biopsies were taken you will be contacted by phone or by letter within the next 1-3 weeks.  Please call us at [phone] if you have not heard about the biopsies in 3 weeks.    SIGNATURES/CONFIDENTIALITY: You and/or your care partner have signed paperwork which will be entered into your electronic medical record.  These signatures attest to the fact that that the information above on your After Visit Summary has been reviewed and is understood.  Full responsibility of the confidentiality of this discharge information lies with you and/or your care-partner.

## 2017-11-13 ENCOUNTER — Encounter: Payer: Self-pay | Admitting: Family Medicine

## 2017-11-13 ENCOUNTER — Telehealth: Payer: Self-pay | Admitting: *Deleted

## 2017-11-13 ENCOUNTER — Telehealth: Payer: Self-pay

## 2017-11-13 ENCOUNTER — Ambulatory Visit (INDEPENDENT_AMBULATORY_CARE_PROVIDER_SITE_OTHER): Payer: Self-pay | Admitting: Family Medicine

## 2017-11-13 VITALS — BP 118/74 | HR 66 | Temp 97.4°F | Resp 18 | Ht 73.0 in | Wt 241.0 lb

## 2017-11-13 DIAGNOSIS — N183 Chronic kidney disease, stage 3 unspecified: Secondary | ICD-10-CM

## 2017-11-13 DIAGNOSIS — R3589 Other polyuria: Secondary | ICD-10-CM

## 2017-11-13 DIAGNOSIS — I10 Essential (primary) hypertension: Secondary | ICD-10-CM

## 2017-11-13 DIAGNOSIS — R809 Proteinuria, unspecified: Secondary | ICD-10-CM

## 2017-11-13 DIAGNOSIS — R358 Other polyuria: Secondary | ICD-10-CM

## 2017-11-13 LAB — POCT URINALYSIS DIP (DEVICE)
BILIRUBIN URINE: NEGATIVE
Glucose, UA: NEGATIVE mg/dL
Hgb urine dipstick: NEGATIVE
Ketones, ur: NEGATIVE mg/dL
Leukocytes, UA: NEGATIVE
NITRITE: NEGATIVE
PH: 5.5 (ref 5.0–8.0)
PROTEIN: NEGATIVE mg/dL
Specific Gravity, Urine: 1.02 (ref 1.005–1.030)
UROBILINOGEN UA: 0.2 mg/dL (ref 0.0–1.0)

## 2017-11-13 NOTE — Telephone Encounter (Signed)
Left message

## 2017-11-13 NOTE — Progress Notes (Signed)
Subjective:    Patient ID: Daniel Joseph, male    DOB: 01/22/1961, 58 y.o.   MRN: [id number] Chief Complaint  Patient presents with  . Hypertension   HPI  Daniel Joseph, a 57 year old male with a history of hypertensive cardiomyopathy and stage III chronic kidney disease presents for follow-up of hypertension. Patient has been taking medications consistently.  He has not been following a low-fat diet or exercising routinely.  Body mass index is 31.8 kg/m.Patient currently denies headaches, dizziness, neck pain, orthopnea, palpitations or shortness of breath.  He does not have a history of sleep apnea or thyroid problem.  Current cardiac risk factors include sedentary lifestyle.   Past Medical History:  Diagnosis Date  . Benign essential HTN 01/19/2014   Renal Artery [place] 6/16:  No RAS, No AAA  . Cardiomyopathy, dilated, nonischemic (HCC)    EF 45-50% bye echo 08/2015 and EF 50-55% by echo 10/2017  . Chronic combined systolic and diastolic CHF (congestive heart failure) ([place]) 01/21/2014  . Gout   . Mitral valve regurgitation    mild to moderate MR with MVP of ANMVL by echo 2019  . PVC's (premature ventricular contractions)    nonsustained VT as well as PVCs - no ICD indicated due to EF 40-45%  . 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 Marital status: Single    Spouse name: Not on file  . Number of children: Not on file  . Years of education: Not on file  . Highest education level: Not on file  Social Needs  . Financial resource strain: Not on file  . Food insecurity - worry: Not on file  . Food insecurity - inability: Not on file  . Transportation needs - medical: Not on file  . Transportation needs - non-medical: Not on file  Occupational History  . Not on file  Tobacco Use  . Smoking status: Never Smoker  . Smokeless tobacco: Never Used  Substance and Sexual Activity  . Alcohol use: No  . Drug use: No  . Sexual  activity: Not on file  Other Topics Concern  . Not on file  Social History Narrative  . Not on file   Immunization History  Administered Date(s) Administered  . Influenza,inj,Quad PF,6+ Mos 06/16/2014, 07/01/2016  . Tdap 02/10/2014  No Known Allergies  Review of Systems  Constitutional: Negative for fever.  HENT: Negative.   Eyes: Negative for blurred vision.  Respiratory: Negative.  Negative for shortness of breath.   Cardiovascular: Negative.  Negative for chest pain, palpitations, orthopnea, leg swelling and PND.  Gastrointestinal: Negative.   Endocrine: Negative for polydipsia, polyphagia and polyuria.  Genitourinary: Negative.   Musculoskeletal: Negative.  Negative for neck pain.  Skin: Negative.   Allergic/Immunologic: Negative.   Neurological: Negative.  Negative for headaches.  Hematological: Negative.   Psychiatric/Behavioral: Negative.        Objective:   Physical Exam  Constitutional: He is oriented to person, place, and time.  Neck: Normal carotid pulses, no hepatojugular reflux and no JVD present. Carotid bruit is not present. No thyroid mass present.  Cardiovascular: Normal rate, regular rhythm, S1 normal and S2 normal.  No murmur heard. Pulses:      Carotid pulses are 2+ on the right side, and 2+ on the left side.      Radial pulses are 2+ on the right side, and 2+ on the left side.  Femoral pulses are 2+ on the right side, and 2+ on the left side.      Popliteal pulses are 2+ on the right side, and 2+ on the left side.       Dorsalis pedis pulses are 2+ on the right side, and 2+ on the left side.       Posterior tibial pulses are 2+ on the right side, and 2+ on the left side.  Pulmonary/Chest: Effort normal and breath sounds normal.  Abdominal: Soft. Bowel sounds are normal.  Musculoskeletal: Normal range of motion.  Neurological: He is alert and oriented to person, place, and time. He has normal reflexes.  Skin: Skin is warm and dry.  Psychiatric:  He has a normal mood and affect. His behavior is normal. Judgment normal.     BP 118/74 (BP Location: Left Arm, Patient Position: Sitting, Cuff Size: Large)   Pulse 66   Temp (!) 97.4 F (36.3 C) (Oral)   Resp 18   Ht 6\' 1"  (1.854 m)   Wt 241 lb (109.3 kg)   SpO2 100%   BMI 31.80 kg/m  Assessment & Plan:  1. Essential hypertension Blood pressure is at goal on current medication regimen.  Goal is < 140/90.  No medications changes warranted on today Continue medication, monitor blood pressure at home. Continue DASH diet. Reminder to go to the ER if any CP, SOB, nausea, dizziness, severe HA, changes vision/speech, left arm numbness and tingling and jaw pain.    - POCT urinalysis dip (device) - Basic Metabolic Panel  2. Proteinuria, unspecified type No proteinuria on urinalysis - POCT urinalysis dip (device)  3. Polyuria  - Hemoglobin A1c  4. CKD (chronic kidney disease), stage III ([place]) Will review renal functioning as results become available.  Previous GFR 1.4, will continue to monitor closely - POCT urinalysis dip (device) - Basic Metabolic Panel   RTC: 3 months for chronic conditions   Donia Pounds  MSN, FNP-C [facility] [address] [place], [place] [address] [phone]

## 2017-11-13 NOTE — Telephone Encounter (Signed)
  Follow up Call-  Call back number 11/12/2017  Post procedure Call Back phone  # [phone]  Permission to leave phone message Yes  Some recent data might be hidden     Patient questions:  Do you have a fever, pain , or abdominal swelling? No. Pain Score  0 *  Have you tolerated food without any problems? Yes.    Have you been able to return to your normal activities? Yes.    Do you have any questions about your discharge instructions: Diet   No. Medications  No. Follow up visit  No.  Do you have questions or concerns about your Care? No.  Actions: * If pain score is 4 or above: No action needed, pain <4.

## 2017-11-13 NOTE — Patient Instructions (Signed)
We have discussed target BP range and blood pressure goal. I have advised patient to check BP regularly and to call us back or report to clinic if the numbers are consistently higher than 140/90. We discussed the importance of compliance with medical therapy and DASH diet recommended, consequences of uncontrolled hypertension discussed.  - continue current BP medications Continue medication, monitor blood pressure at home. Continue DASH diet. Reminder to go to the ER if any CP, SOB, nausea, dizziness, severe HA, changes vision/speech, left arm numbness and tingling and jaw pain.     DASH Eating Plan DASH stands for "Dietary Approaches to Stop Hypertension." The DASH eating plan is a healthy eating plan that has been shown to reduce high blood pressure (hypertension). It may also reduce your risk for type 2 diabetes, heart disease, and stroke. The DASH eating plan may also help with weight loss. What are tips for following this plan? General guidelines  Avoid eating more than 2,300 mg (milligrams) of salt (sodium) a day. If you have hypertension, you may need to reduce your sodium intake to 1,500 mg a day.  Limit alcohol intake to no more than 1 drink a day for nonpregnant women and 2 drinks a day for men. One drink equals 12 oz of beer, 5 oz of wine, or 1 oz of hard liquor.  Work with your health care provider to maintain a healthy body weight or to lose weight. Ask what an ideal weight is for you.  Get at least 30 minutes of exercise that causes your heart to beat faster (aerobic exercise) most days of the week. Activities may include walking, swimming, or biking.  Work with your health care provider or diet and nutrition specialist (dietitian) to adjust your eating plan to your individual calorie needs. Reading food labels  Check food labels for the amount of sodium per serving. Choose foods with less than 5 percent of the Daily Value of sodium. Generally, foods with less than 300 mg of  sodium per serving fit into this eating plan.  To find whole grains, look for the word "whole" as the first word in the ingredient list. Shopping  Buy products labeled as "low-sodium" or "no salt added."  Buy fresh foods. Avoid canned foods and premade or frozen meals. Cooking  Avoid adding salt when cooking. Use salt-free seasonings or herbs instead of table salt or sea salt. Check with your health care provider or pharmacist before using salt substitutes.  Do not fry foods. Cook foods using healthy methods such as baking, boiling, grilling, and broiling instead.  Cook with heart-healthy oils, such as olive, canola, soybean, or sunflower oil. Meal planning   Eat a balanced diet that includes: ? 5 or more servings of fruits and vegetables each day. At each meal, try to fill half of your plate with fruits and vegetables. ? Up to 6-8 servings of whole grains each day. ? Less than 6 oz of lean meat, poultry, or fish each day. A 3-oz serving of meat is about the same size as a deck of cards. One egg equals 1 oz. ? 2 servings of low-fat dairy each day. ? A serving of nuts, seeds, or beans 5 times each week. ? Heart-healthy fats. Healthy fats called Omega-3 fatty acids are found in foods such as flaxseeds and coldwater fish, like sardines, salmon, and mackerel.  Limit how much you eat of the following: ? Canned or prepackaged foods. ? Food that is high in trans fat, such as fried  foods. ? Food that is high in saturated fat, such as fatty meat. ? Sweets, desserts, sugary drinks, and other foods with added sugar. ? Full-fat dairy products.  Do not salt foods before eating.  Try to eat at least 2 vegetarian meals each week.  Eat more home-cooked food and less restaurant, buffet, and fast food.  When eating at a restaurant, ask that your food be prepared with less salt or no salt, if possible. What foods are recommended? The items listed may not be a complete list. Talk with your  dietitian about what dietary choices are best for you. Grains Whole-grain or whole-wheat bread. Whole-grain or whole-wheat pasta. Brown rice. [place]. Bulgur. Whole-grain and low-sodium cereals. Pita bread. Low-fat, low-sodium crackers. Whole-wheat flour tortillas. Vegetables Fresh or frozen vegetables (raw, steamed, roasted, or grilled). Low-sodium or reduced-sodium tomato and vegetable juice. Low-sodium or reduced-sodium tomato sauce and tomato paste. Low-sodium or reduced-sodium canned vegetables. Fruits All fresh, dried, or frozen fruit. Canned fruit in natural juice (without added sugar). Meat and other protein foods Skinless chicken or [place]. Ground chicken or [place]. Pork with fat trimmed off. Fish and seafood. Egg whites. Dried beans, peas, or lentils. Unsalted nuts, nut butters, and seeds. Unsalted canned beans. Lean cuts of beef with fat trimmed off. Low-sodium, lean deli meat. Dairy Low-fat (1%) or fat-free (skim) milk. Fat-free, low-fat, or reduced-fat cheeses. Nonfat, low-sodium ricotta or cottage cheese. Low-fat or nonfat yogurt. Low-fat, low-sodium cheese. Fats and oils Soft margarine without trans fats. Vegetable oil. Low-fat, reduced-fat, or light mayonnaise and salad dressings (reduced-sodium). Canola, safflower, olive, soybean, and sunflower oils. Avocado. Seasoning and other foods Herbs. Spices. Seasoning mixes without salt. Unsalted popcorn and pretzels. Fat-free sweets. What foods are not recommended? The items listed may not be a complete list. Talk with your dietitian about what dietary choices are best for you. Grains Baked goods made with fat, such as croissants, muffins, or some breads. Dry pasta or rice meal packs. Vegetables Creamed or fried vegetables. Vegetables in a cheese sauce. Regular canned vegetables (not low-sodium or reduced-sodium). Regular canned tomato sauce and paste (not low-sodium or reduced-sodium). Regular tomato and vegetable juice (not  low-sodium or reduced-sodium). Angie Fava. Olives. Fruits Canned fruit in a light or heavy syrup. Fried fruit. Fruit in cream or butter sauce. Meat and other protein foods Fatty cuts of meat. Ribs. Fried meat. Berniece Salines. Sausage. Bologna and other processed lunch meats. Salami. Fatback. Hotdogs. Bratwurst. Salted nuts and seeds. Canned beans with added salt. Canned or smoked fish. Whole eggs or egg yolks. Chicken or [place] with skin. Dairy Whole or 2% milk, cream, and half-and-half. Whole or full-fat cream cheese. Whole-fat or sweetened yogurt. Full-fat cheese. Nondairy creamers. Whipped toppings. Processed cheese and cheese spreads. Fats and oils Butter. Stick margarine. Lard. Shortening. Ghee. Bacon fat. Tropical oils, such as coconut, palm kernel, or palm oil. Seasoning and other foods Salted popcorn and pretzels. Onion salt, garlic salt, seasoned salt, table salt, and sea salt. Worcestershire sauce. Tartar sauce. Barbecue sauce. Teriyaki sauce. Soy sauce, including reduced-sodium. Steak sauce. Canned and packaged gravies. Fish sauce. Oyster sauce. Cocktail sauce. Horseradish that you find on the shelf. Ketchup. Mustard. Meat flavorings and tenderizers. Bouillon cubes. Hot sauce and Tabasco sauce. Premade or packaged marinades. Premade or packaged taco seasonings. Relishes. Regular salad dressings. Where to find more information:  National Heart, Lung, and [place]: https://wilson-eaton.com/  American Heart Association: www.heart.org Summary  The DASH eating plan is a healthy eating plan that has been shown to reduce  high blood pressure (hypertension). It may also reduce your risk for type 2 diabetes, heart disease, and stroke.  With the DASH eating plan, you should limit salt (sodium) intake to 2,300 mg a day. If you have hypertension, you may need to reduce your sodium intake to 1,500 mg a day.  When on the DASH eating plan, aim to eat more fresh fruits and vegetables, whole grains, lean proteins,  low-fat dairy, and heart-healthy fats.  Work with your health care provider or diet and nutrition specialist (dietitian) to adjust your eating plan to your individual calorie needs. This information is not intended to replace advice given to you by your health care provider. Make sure you discuss any questions you have with your health care provider. Document Released: 08/22/2011 Document Revised: 08/26/2016 Document Reviewed: 08/26/2016 Elsevier Interactive Patient Education  [employer].

## 2017-11-14 LAB — BASIC METABOLIC PANEL
BUN / CREAT RATIO: 10 (ref 9–20)
BUN: 12 mg/dL (ref 6–24)
CO2: 22 mmol/L (ref 20–29)
CREATININE: 1.17 mg/dL (ref 0.76–1.27)
Calcium: 9.2 mg/dL (ref 8.7–10.2)
Chloride: 104 mmol/L (ref 96–106)
GFR, EST AFRICAN AMERICAN: 81 mL/min/{1.73_m2} (ref >59)
GFR, EST NON AFRICAN AMERICAN: 70 mL/min/{1.73_m2} (ref >59)
Glucose: 81 mg/dL (ref 65–99)
Potassium: 4.4 mmol/L (ref 3.5–5.2)
SODIUM: 139 mmol/L (ref 134–144)

## 2017-11-14 LAB — HEMOGLOBIN A1C
Est. average glucose Bld gHb Est-mCnc: 108 mg/dL
Hgb A1c MFr Bld: 5.4 % (ref 4.8–5.6)

## 2017-11-19 ENCOUNTER — Other Ambulatory Visit: Payer: Self-pay | Admitting: Family Medicine

## 2018-01-28 MED FILL — ALLOPURINOL 100 MG TABLET: 100 | 30 days supply | Qty: 60 | Fill #1

## 2018-01-28 MED FILL — hydrALAZINE HCL 50 MG TABS: 50 | 30 days supply | Qty: 180 | Fill #1

## 2018-01-28 MED FILL — SPIRONOLACTONE 25 MG TABLET: 25 | 30 days supply | Qty: 30 | Fill #3

## 2018-01-28 MED FILL — AMLODIPINE BESYLATE 10 MG T: 10 | 30 days supply | Qty: 30 | Fill #3

## 2018-01-28 MED FILL — ?FUROSEMIDE 40 MG TABLET: 40 | 30 days supply | Qty: 30 | Fill #5

## 2018-01-28 MED FILL — cloNIDine HCL 0.2 MG TABS: 0.2 | 30 days supply | Qty: 30 | Fill #2

## 2018-01-28 MED FILL — POTASSIUM CL 10 MEQ TAB SA: 10 | 30 days supply | Qty: 30 | Fill #2

## 2018-03-14 ENCOUNTER — Encounter (HOSPITAL_COMMUNITY): Payer: Self-pay | Admitting: Emergency Medicine

## 2018-03-14 ENCOUNTER — Emergency Department (HOSPITAL_COMMUNITY)
Admission: EM | Admit: 2018-03-14 | Discharge: 2018-03-14 | Disposition: A | Discharge status: Home / Self Care | Payer: Self-pay | Attending: Emergency Medicine | Admitting: Emergency Medicine

## 2018-03-14 ENCOUNTER — Emergency Department (HOSPITAL_COMMUNITY): Payer: Self-pay

## 2018-03-14 DIAGNOSIS — I110 Hypertensive heart disease with heart failure: Secondary | ICD-10-CM | POA: Insufficient documentation

## 2018-03-14 DIAGNOSIS — Z79899 Other long term (current) drug therapy: Secondary | ICD-10-CM | POA: Insufficient documentation

## 2018-03-14 DIAGNOSIS — Z7982 Long term (current) use of aspirin: Secondary | ICD-10-CM | POA: Insufficient documentation

## 2018-03-14 DIAGNOSIS — I509 Heart failure, unspecified: Secondary | ICD-10-CM | POA: Insufficient documentation

## 2018-03-14 DIAGNOSIS — Y939 Activity, unspecified: Secondary | ICD-10-CM | POA: Insufficient documentation

## 2018-03-14 DIAGNOSIS — Y999 Unspecified external cause status: Secondary | ICD-10-CM | POA: Insufficient documentation

## 2018-03-14 DIAGNOSIS — S8012XA Contusion of left lower leg, initial encounter: Secondary | ICD-10-CM | POA: Insufficient documentation

## 2018-03-14 DIAGNOSIS — W19XXXA Unspecified fall, initial encounter: Secondary | ICD-10-CM | POA: Insufficient documentation

## 2018-03-14 DIAGNOSIS — Y929 Unspecified place or not applicable: Secondary | ICD-10-CM | POA: Insufficient documentation

## 2018-03-14 MED ORDER — ACETAMINOPHEN 325 MG PO TABS
650.0000 mg | ORAL_TABLET | Freq: Once | ORAL | Status: AC
  Administered 2018-03-14: 650 mg via ORAL
  Filled 2018-03-14: qty 2

## 2018-03-14 MED ORDER — DOXYCYCLINE HYCLATE 100 MG PO CAPS: 100.0000 mg | ORAL_CAPSULE | Freq: Two times a day (BID) | ORAL | 0 refills | Status: AC

## 2018-03-14 NOTE — ED Provider Notes (Signed)
[facility] DEPT Provider Note   CSN: [id number] Arrival date & time: 03/14/18  1733     History   Chief Complaint Chief Complaint  Patient presents with  . Insect Bite    HPI Daniel Joseph is a 57 y.o. male.  Daniel Joseph is a 57 y.o male with PMH of CHF and HTN presents to the ED complaining of a fall x 2 days ago.Patient states he was walking when he trip and fell hitting his left shin against the concrete.Patient denies hitting his head.He describes the pain as a constant throbbing radiating down his leg.He has tried ice, warm compresses which has made the bump smaller but has no pain relieve. He denies any fever, headache, CP or shortness of breath.      Past Medical History:  Diagnosis Date  . Benign essential HTN 01/19/2014   Renal Artery [place] 6/16:  No RAS, No AAA  . Cardiomyopathy, dilated, nonischemic (HCC)    EF 45-50% bye echo 08/2015 and EF 50-55% by echo 10/2017  . Chronic combined systolic and diastolic CHF (congestive heart failure) ([place]) 01/21/2014  . Gout   . Mitral valve regurgitation    mild to moderate MR with MVP of ANMVL by echo 2019  . PVC's (premature ventricular contractions)    nonsustained VT as well as PVCs - no ICD indicated due to EF 40-45%  . 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 PVC's (premature ventricular contractions)   . Acute midline low back pain without sciatica 09/19/2016  . Acute gout 03/06/2016  . Left elbow pain 03/06/2016  . Heel spur 08/29/2015  . Nonsustained paroxysmal ventricular tachycardia ([place]) 01/26/2015  . Encounter for staple removal 08/23/2014  . HTN (hypertension) 03/02/2014  . Chronic combined systolic and diastolic CHF (congestive heart failure) ([place]) 03/02/2014  . Abnormal EKG 03/02/2014  . Hypertensive cardiomyopathy ([place]) 03/02/2014  . CKD (chronic kidney disease), stage III ([place]) 02/15/2014  .  Immunization due 02/15/2014  . Proteinuria 02/15/2014  . Encounter to establish care 02/03/2014  . Neck nodule 02/01/2014  . Eye redness 02/01/2014  . Mitral valve regurgitation 01/21/2014  . 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 Nocturia 01/19/2014  . Headache 01/19/2014  . T wave inversion in EKG 01/19/2014    Past Surgical History:  Procedure Laterality Date  . ABDOMINAL SURGERY  at birth  . TEE WITHOUT CARDIOVERSION N/A 01/25/2014   Procedure: TRANSESOPHAGEAL ECHOCARDIOGRAM (TEE);  Surgeon: [name], MD;  Location: [facility];  Service: Cardiovascular;  Laterality: N/A;  . TEE WITHOUT CARDIOVERSION N/A 09/14/2014   Procedure: TRANSESOPHAGEAL ECHOCARDIOGRAM (TEE);  Surgeon: Sueanne Margarita, MD;  Location: [facility] ENDOSCOPY;  Service: Cardiovascular;  Laterality: N/A;        Home Medications    Prior to Admission medications   Medication Sig Start Date End Date Taking? Authorizing Provider  allopurinol (ZYLOPRIM) 100 MG tablet Take 1 tablet (100 mg total) by mouth 2 (two) times daily. 05/09/17  Yes Dorena Dew, FNP  amLODipine (NORVASC) 10 MG tablet Take 1 tablet (10 mg total) by mouth daily. 05/05/17  Yes Dorena Dew, FNP  aspirin EC 81 MG tablet Take 1 tablet (81 mg total) by mouth daily. 04/01/14  Yes Deboraha Sprang, MD  benazepril (LOTENSIN) 40 MG tablet Take 1 tablet (40 mg total) by mouth daily. 02/12/17  Yes Sueanne Margarita, MD  cetirizine (  ZYRTEC) 10 MG tablet Take 1 tablet (10 mg total) by mouth daily. 11/18/16  Yes Dorena Dew, FNP  cloNIDine (CATAPRES) 0.2 MG tablet Take 1 tablet (0.2 mg total) by mouth daily. 05/05/17  Yes Dorena Dew, FNP  doxazosin (CARDURA) 4 MG tablet Take 1.5 tablets (6 mg total) by mouth at bedtime. 08/13/16  Yes Turner, Eber Hong, MD  furosemide (LASIX) 40 MG tablet Take 1 tablet (40 mg total) by mouth daily. 05/05/17  Yes Dorena Dew, FNP  gabapentin (NEURONTIN) 300 MG capsule Take 1 capsule (300 mg total) by mouth 3  (three) times daily. 08/13/17  Yes Dorena Dew, FNP  hydrALAZINE (APRESOLINE) 50 MG tablet TAKE 2 TABLETS BY MOUTH 3 TIMES DAILY 05/05/17  Yes Dorena Dew, FNP  isosorbide mononitrate (IMDUR) 30 MG 24 hr tablet Take 1 tablet (30 mg total) by mouth daily. 08/13/16  Yes Turner, Eber Hong, MD  labetalol (NORMODYNE) 200 MG tablet Take 1 tablet (200 mg total) by mouth 2 (two) times daily. 05/05/17 04/30/18 Yes Dorena Dew, FNP  potassium chloride (K-DUR) 10 MEQ tablet TAKE 1 TABLET BY MOUTH DAILY. 10/14/17  Yes Turner, Eber Hong, MD  spironolactone (ALDACTONE) 25 MG tablet Take 25 mg by mouth daily. 01/28/18  Yes [provider]  tizanidine (ZANAFLEX) 2 MG capsule Take 1 capsule (2 mg total) by mouth 3 (three) times daily. 07/12/17  Yes Lacretia Leigh, MD  spironolactone (ALDACTONE) 50 MG tablet Take 1 tablet (50 mg total) by mouth daily. Patient not taking: Reported on 03/14/2018 06/03/17 05/29/18  Sueanne Margarita, MD  traMADol (ULTRAM) 50 MG tablet Take 1 tablet (50 mg total) by mouth every 8 (eight) hours as needed for severe pain. 07/03/17   Dorena Dew, FNP    Family History Family History  Problem Relation Age of Onset  . Heart Problems Mother   . Stroke Mother   . Heart failure Mother   . Hypertension Mother   . Heart Problems Father   . Heart attack Father   . Hypertension Father   . Heart Problems Brother   . Heart attack Brother   . Hypertension Brother   . Colon cancer Neg Hx   . Esophageal cancer Neg Hx   . Stomach cancer Neg Hx     Social History Social History   Tobacco Use  . Smoking status: Never Smoker  . Smokeless tobacco: Never Used  Substance Use Topics  . Alcohol use: No  . Drug use: No     Allergies   Patient has no known allergies.   Review of Systems Review of Systems  Constitutional: Negative for chills and fever.  Respiratory: Negative for shortness of breath.   Cardiovascular: Negative for chest pain.  Gastrointestinal:  Negative for abdominal pain, diarrhea, nausea and vomiting.  Genitourinary: Negative for dysuria and flank pain.  Musculoskeletal: Negative for back pain and myalgias.  Neurological: Negative for syncope and headaches.  All other systems reviewed and are negative.    Physical Exam Updated Vital Signs BP (!) 165/118 (BP Location: Right Arm)   Pulse 64   Temp 98.2 F (36.8 C) (Oral)   Resp 17   SpO2 98%   Physical Exam  Constitutional: He is oriented to person, place, and time. He appears well-developed and well-nourished.  HENT:  Head: Normocephalic and atraumatic.  Cardiovascular: Normal heart sounds.  Pulmonary/Chest: Effort normal and breath sounds normal. He has no rales.  Abdominal: Soft. Bowel sounds are normal. He  exhibits no distension. There is no tenderness.  Musculoskeletal: He exhibits no deformity.  Neurological: He is alert and oriented to person, place, and time.  Skin: Skin is warm and dry. No bruising and no laceration noted. There is erythema (exquisitely tender, soft, indurated mass surrounded by erythema).        ED Treatments / Results  Labs (all labs ordered are listed, but only abnormal results are displayed) Labs Reviewed - No data to display  EKG None  Radiology Dg Tibia/fibula Left  Result Date: 03/14/2018 CLINICAL DATA:  Bite to left medial lower leg. Redness and swelling. EXAM: LEFT TIBIA AND FIBULA - 2 VIEW COMPARISON:  None. FINDINGS: No bony abnormalities identified. No fractures or dislocations. No bony erosion. The soft tissues are unremarkable. No soft tissue gas is seen in the region of the patient's bite. IMPRESSION: Negative. Electronically Signed   By: Dorise Bullion III M.D   On: 03/14/2018 19:41    Procedures Procedures (including critical care time)  Medications Ordered in ED Medications  acetaminophen (TYLENOL) tablet 650 mg (650 mg Oral Given 03/14/18 2024)     Initial Impression / Assessment and Plan / ED Course  I  have reviewed the triage vital signs and the nursing notes.  Pertinent labs & imaging results that were available during my care of the patient were reviewed by me and considered in my medical decision making (see chart for details).     Patient seen in the ED s/p fall.Patient has tried warm compressed and ice and has seen a decrease in size of the hematoma but still has pain.Patient given 625 mg Tylenol.Will place patient on antibiotics to protect from infection. No calf tenderness on exam. Tetanus up to date per patient.Xray tib fib negative. No further emergent workup is required at this time. I have discussed my treatment plan with my attending physician Dr. Tamera Punt.   Patients BP remains elevated while in the ED, advised patient to follow up PCP for BP control. Patients denies any chest pain, shortness of breath, headache at this time. I have explained to the patient the options, patient has decided to try course of antibiotics and will continue to monitor the hematoma for redness or if he develops any fever.Will recheck manual BP before discharging.  Final Clinical Impressions(s) / ED Diagnoses   Final diagnoses:  Traumatic hematoma of left lower leg, initial encounter    ED Discharge Orders    None       Janeece Fitting, PA-C 03/14/18 2131    Malvin Johns, MD 03/14/18 [phone]

## 2018-03-14 NOTE — ED Notes (Signed)
Bed: WA09 Expected date:  Expected time:  Means of arrival:  Comments: 

## 2018-03-14 NOTE — ED Triage Notes (Signed)
Patient here from home with complaints of unknown bite on left lower leg. Pain 10/10. Redness and swelling noted.

## 2018-03-14 NOTE — Discharge Instructions (Addendum)
Today you were seen in the ED for lower extremity hematoma. Please continue warm compresses to the area. If you notice your symptoms worsen please return to the Ed,if you experience fever, chest pain or shortness of breath.I am prescribing some antibiotics in order to protect you from infection. Please take medication as directed.

## 2018-03-14 NOTE — ED Notes (Signed)
Pt. Is taking blood pressure med nigthly and stated that he will take his night dose soon as he gets home.

## 2018-03-16 MED FILL — DOXYCYCLINE HYCLATE 100 MG: 100 | 7 days supply | Qty: 14 | Fill #0

## 2018-03-18 ENCOUNTER — Ambulatory Visit (INDEPENDENT_AMBULATORY_CARE_PROVIDER_SITE_OTHER): Payer: Self-pay | Admitting: Family Medicine

## 2018-03-18 ENCOUNTER — Ambulatory Visit (HOSPITAL_COMMUNITY): Admission: RE | Admit: 2018-03-18 | Payer: Self-pay | Source: Ambulatory Visit

## 2018-03-18 ENCOUNTER — Encounter: Payer: Self-pay | Admitting: Family Medicine

## 2018-03-18 VITALS — BP 125/74 | HR 70 | Temp 98.4°F | Resp 16 | Ht 73.0 in | Wt 231.0 lb

## 2018-03-18 DIAGNOSIS — M79605 Pain in left leg: Secondary | ICD-10-CM

## 2018-03-18 DIAGNOSIS — L03116 Cellulitis of left lower limb: Secondary | ICD-10-CM

## 2018-03-18 MED ORDER — TRIAMCINOLONE ACETONIDE 0.1 % EX CREA: 1.0000 "application " | TOPICAL_CREAM | Freq: Two times a day (BID) | CUTANEOUS | 0 refills | Status: DC

## 2018-03-18 NOTE — Progress Notes (Signed)
  Subjective:     Patient ID: Daniel Joseph, male   DOB: 10/10/1960, 57 y.o.   MRN: [id number] Chief Complaint  Patient presents with  . Insect Bite    Patient presents with pain and swelling in the left lower extremity x 1 week. Patient states that he went to the ED 2 days ago. Was given doxy. Has been taking it with no relief. Patient states that he has taken tylenol with mild relief. Denies hx of DVT.  Review of ED note reports patient with a fall 2 days prior to ED visit. ED diagnosed with cellulitis and possible hematoma. Instructed to return to ED if worsening.  Patient states that he has had mild relief after being on antibiotic for 2 days.   Review of Systems  Constitutional: Negative.  Negative for fever.  Skin: Positive for rash (possible insect bite on lower left extermity. ).       Objective:   Physical Exam  Constitutional: He appears well-developed and well-nourished.  Skin: Skin is warm. There is erythema (There is a raised erythematous indurated lesion noted to the medial aspect of the lower left extremity. Pain with palpation. ).  Approximately 2 inches in diameter.   BP 125/74 (BP Location: Right Arm, Patient Position: Sitting, Cuff Size: Large)   Pulse 70   Temp 98.4 F (36.9 C) (Oral)   Resp 16   Ht 6\' 1"  (1.854 m)   Wt 231 lb (104.8 kg)   SpO2 100%   BMI 30.48 kg/m       Assessment:    Leg pain, left - Plan: VAS [place] LOWER EXTREMITY VENOUS (DVT)  Cellulitis of left lower extremity    Plan:  1. Leg pain, left  - VAS [place] LOWER EXTREMITY VENOUS (DVT); Future    Continue with doxy as prescribed. Warm compress to the area TID.Continue with tylenol.  Return to care if symptoms worsen or persist.

## 2018-03-18 NOTE — Patient Instructions (Addendum)
We ordered a doppler to rule out a DVT (blood clot). Continue with your current antibiotic. Warm compress to the area 3 times per day. I will call you with the results of the doppler. Continue with tylenol for the pain. Sent a steroid cream to the pharmacy for the itching.  Cellulitis, Adult Cellulitis is a skin infection. The infected area is usually red and sore. This condition occurs most often in the arms and lower legs. It is very important to get treated for this condition. Follow these instructions at home:  Take over-the-counter and prescription medicines only as told by your doctor.  If you were prescribed an antibiotic medicine, take it as told by your doctor. Do not stop taking the antibiotic even if you start to feel better.  Drink enough fluid to keep your pee (urine) clear or pale yellow.  Do not touch or rub the infected area.  Raise (elevate) the infected area above the level of your heart while you are sitting or lying down.  Place warm or cold wet cloths (warm or cold compresses) on the infected area. Do this as told by your doctor.  Keep all follow-up visits as told by your doctor. This is important. These visits let your doctor make sure your infection is not getting worse. Contact a doctor if:  You have a fever.  Your symptoms do not get better after 1-2 days of treatment.  Your bone or joint under the infected area starts to hurt after the skin has healed.  Your infection comes back. This can happen in the same area or another area.  You have a swollen bump in the infected area.  You have new symptoms.  You feel ill and also have muscle aches and pains. Get help right away if:  Your symptoms get worse.  You feel very sleepy.  You throw up (vomit) or have watery poop (diarrhea) for a long time.  There are red streaks coming from the infected area.  Your red area gets larger.  Your red area turns darker. This information is not intended to replace  advice given to you by your health care provider. Make sure you discuss any questions you have with your health care provider. Document Released: 02/19/2008 Document Revised: 02/08/2016 Document Reviewed: 07/12/2015 Elsevier Interactive Patient Education  2018 [employer].

## 2018-03-20 ENCOUNTER — Ambulatory Visit (HOSPITAL_COMMUNITY)
Admission: RE | Admit: 2018-03-20 | Discharge: 2018-03-20 | Disposition: A | Discharge status: Home / Self Care | Payer: Self-pay | Source: Ambulatory Visit | Attending: Family Medicine | Admitting: Family Medicine

## 2018-03-20 DIAGNOSIS — M79605 Pain in left leg: Secondary | ICD-10-CM | POA: Insufficient documentation

## 2018-03-20 DIAGNOSIS — R9389 Abnormal findings on diagnostic imaging of other specified body structures: Secondary | ICD-10-CM | POA: Insufficient documentation

## 2018-03-20 MED FILL — TRIAMCINOLONE 0.1% CREAM: 0.1 | 30 days supply | Qty: 30 | Fill #0

## 2018-03-20 NOTE — Progress Notes (Addendum)
Left lower extremity venous duplex has been completed. Negative for DVT. There is evidence of an anechoic structure measuring 1.3 cm high by 3 cm wide by 4.1 cm long ocated in the left medial ankle.  Results were faxed to Lanae Boast FNP.  03/20/18 10:53 AM Carlos Levering RVT

## 2018-03-23 ENCOUNTER — Telehealth: Payer: Self-pay

## 2018-03-23 NOTE — Telephone Encounter (Signed)
Called and spoke with patient. Advised that dvt was negative and to continue abx. Patient verbalized understanding. Thanks!

## 2018-03-23 NOTE — Telephone Encounter (Signed)
-----   Message from Lanae Boast, [place] sent at 03/23/2018  8:22 AM EDT ----- No signs of DVT at the present time. Continue with antibiotics as prescribed.

## 2018-03-25 ENCOUNTER — Ambulatory Visit: Payer: Self-pay | Attending: Family Medicine

## 2018-05-13 ENCOUNTER — Ambulatory Visit: Payer: Self-pay | Admitting: Family Medicine

## 2018-05-15 ENCOUNTER — Telehealth: Payer: Self-pay

## 2018-05-15 MED ORDER — CLONIDINE HCL 0.2 MG PO TABS: 0.2000 mg | ORAL_TABLET | Freq: Every day | ORAL | 3 refills | Status: DC

## 2018-05-15 MED ORDER — HYDRALAZINE HCL 50 MG PO TABS: ORAL_TABLET | ORAL | 2 refills | Status: DC

## 2018-05-15 MED ORDER — BENAZEPRIL HCL 40 MG PO TABS: 40.0000 mg | ORAL_TABLET | Freq: Every day | ORAL | 6 refills | Status: DC

## 2018-05-15 MED ORDER — AMLODIPINE BESYLATE 10 MG PO TABS: 10.0000 mg | ORAL_TABLET | Freq: Every day | ORAL | 4 refills | Status: DC

## 2018-05-15 MED ORDER — LABETALOL HCL 200 MG PO TABS: 200.0000 mg | ORAL_TABLET | Freq: Two times a day (BID) | ORAL | 3 refills | Status: DC

## 2018-05-15 MED ORDER — DOXAZOSIN MESYLATE 4 MG PO TABS: 6.0000 mg | ORAL_TABLET | Freq: Every day | ORAL | 2 refills | Status: DC

## 2018-05-15 MED ORDER — ISOSORBIDE MONONITRATE ER 30 MG PO TB24: 30.0000 mg | ORAL_TABLET | Freq: Every day | ORAL | 2 refills | Status: DC

## 2018-05-15 MED FILL — LABETALOL HCL 200 MG TABLET: 200 | 30 days supply | Qty: 60 | Fill #0

## 2018-05-15 MED FILL — AMLODIPINE BESYLATE 10 MG T: 10 | 30 days supply | Qty: 30 | Fill #0

## 2018-05-15 MED FILL — cloNIDine HCL 0.2 MG TABS: 0.2 | 30 days supply | Qty: 30 | Fill #0

## 2018-05-15 MED FILL — ISOSORBIDE MN ER 30 MG TAB: 30 | 30 days supply | Qty: 30 | Fill #0

## 2018-05-15 MED FILL — BENAZEPRIL HCL 40 MG TABLET: 40 | 30 days supply | Qty: 30 | Fill #0

## 2018-05-15 MED FILL — hydrALAZINE HCL 50 MG TABS: 50 | 30 days supply | Qty: 180 | Fill #0

## 2018-05-15 NOTE — Telephone Encounter (Signed)
Refills sent into pharmacy. Thanks!  

## 2018-05-21 ENCOUNTER — Ambulatory Visit (INDEPENDENT_AMBULATORY_CARE_PROVIDER_SITE_OTHER): Payer: Self-pay | Admitting: Family Medicine

## 2018-05-21 VITALS — BP 156/92 | HR 67 | Temp 97.0°F | Resp 16 | Ht 73.0 in | Wt 228.0 lb

## 2018-05-21 DIAGNOSIS — R9389 Abnormal findings on diagnostic imaging of other specified body structures: Secondary | ICD-10-CM

## 2018-05-21 DIAGNOSIS — I340 Nonrheumatic mitral (valve) insufficiency: Secondary | ICD-10-CM

## 2018-05-21 DIAGNOSIS — M25551 Pain in right hip: Secondary | ICD-10-CM

## 2018-05-21 DIAGNOSIS — T7840XD Allergy, unspecified, subsequent encounter: Secondary | ICD-10-CM

## 2018-05-21 DIAGNOSIS — I10 Essential (primary) hypertension: Secondary | ICD-10-CM

## 2018-05-21 DIAGNOSIS — E785 Hyperlipidemia, unspecified: Secondary | ICD-10-CM

## 2018-05-21 MED ORDER — CETIRIZINE HCL 10 MG PO TABS: 10.0000 mg | ORAL_TABLET | Freq: Every day | ORAL | 11 refills | Status: DC

## 2018-05-21 NOTE — Patient Instructions (Signed)

## 2018-05-21 NOTE — Progress Notes (Signed)
Patient [facility]  Provider: Lanae Boast, FNP   Hypertension Follow Up Visit  SUBJECTIVE:  Daniel Joseph is a 57 y.o. male who  has a past medical history of Benign essential HTN (01/19/2014), Cardiomyopathy, dilated, nonischemic ([place]), Chronic combined systolic and diastolic CHF (congestive heart failure) ([place]) (01/21/2014), Gout, Mitral valve regurgitation, PVC's (premature ventricular contractions), and Tricuspid valve mass. .   New concerns: patient states that he was seen by a provider for his state disability for [facility] is waiting to hear back from them. He states that he is having mild sciatic flare on the right side today.   Current Outpatient Medications  Medication Sig Dispense Refill  . allopurinol (ZYLOPRIM) 100 MG tablet Take 1 tablet (100 mg total) by mouth 2 (two) times daily. 60 tablet 5  . amLODipine (NORVASC) 10 MG tablet Take 1 tablet (10 mg total) by mouth daily. 30 tablet 4  . aspirin EC 81 MG tablet Take 1 tablet (81 mg total) by mouth daily. 90 tablet 3  . benazepril (LOTENSIN) 40 MG tablet Take 1 tablet (40 mg total) by mouth daily. 30 tablet 6  . cetirizine (ZYRTEC) 10 MG tablet Take 1 tablet (10 mg total) by mouth daily. 30 tablet 11  . cloNIDine (CATAPRES) 0.2 MG tablet Take 1 tablet (0.2 mg total) by mouth daily. 90 tablet 3  . doxazosin (CARDURA) 4 MG tablet Take 1.5 tablets (6 mg total) by mouth at bedtime. 45 tablet 2  . furosemide (LASIX) 40 MG tablet Take 1 tablet (40 mg total) by mouth daily. 30 tablet 6  . gabapentin (NEURONTIN) 300 MG capsule Take 1 capsule (300 mg total) by mouth 3 (three) times daily. 90 capsule 3  . hydrALAZINE (APRESOLINE) 50 MG tablet TAKE 2 TABLETS BY MOUTH 3 TIMES DAILY 180 tablet 2  . isosorbide mononitrate (IMDUR) 30 MG 24 hr tablet Take 1 tablet (30 mg total) by mouth daily. 30 tablet 2  . labetalol (NORMODYNE) 200 MG tablet Take 1 tablet (200 mg total) by mouth 2 (two) times  daily. 180 tablet 3  . potassium chloride (K-DUR) 10 MEQ tablet TAKE 1 TABLET BY MOUTH DAILY. 30 tablet 8  . spironolactone (ALDACTONE) 50 MG tablet Take 1 tablet (50 mg total) by mouth daily. 30 tablet 11  . tizanidine (ZANAFLEX) 2 MG capsule Take 1 capsule (2 mg total) by mouth 3 (three) times daily. 20 capsule 0  . traMADol (ULTRAM) 50 MG tablet Take 1 tablet (50 mg total) by mouth every 8 (eight) hours as needed for severe pain. 30 tablet 0  . triamcinolone cream (KENALOG) 0.1 % Apply 1 application topically 2 (two) times daily. 30 g 0  . spironolactone (ALDACTONE) 25 MG tablet Take 25 mg by mouth daily.  11   Current Facility-Administered Medications  Medication Dose Route Frequency Provider Last Rate Last Dose  . 0.9 %  sodium chloride infusion  500 mL Intravenous Once Nelida Meuse III, MD        No results found for this or any previous visit (from the past 2160 hour(s)).  Hypertension ROS: Review of Systems  Constitutional: Negative.   HENT: Negative.   Eyes: Negative.   Respiratory: Negative.   Cardiovascular: Negative.   Gastrointestinal: Negative.   Genitourinary: Negative.   Musculoskeletal: Positive for joint pain (right hip).  Skin: Negative.   Neurological: Negative.   Psychiatric/Behavioral: Negative.      OBJECTIVE:   BP (!) 156/97 (BP  Location: Right Arm, Patient Position: Sitting, Cuff Size: Normal)   Pulse 67   Temp (!) 97 F (36.1 C) (Oral)   Resp 16   Ht 6\' 1"  (1.854 m)   Wt 228 lb (103.4 kg)   SpO2 100%   BMI 30.08 kg/m   Physical Exam  Constitutional: He is oriented to person, place, and time. He appears well-developed and well-nourished. No distress.  HENT:  Head: Normocephalic and atraumatic.  Eyes: Pupils are equal, round, and reactive to light. Conjunctivae and EOM are normal.  Neck: Normal range of motion.  Cardiovascular: Normal rate, normal heart sounds and intact distal pulses.  No murmur heard. Pulmonary/Chest: Effort normal and  breath sounds normal. No respiratory distress.  Musculoskeletal: Normal range of motion.  Neurological: He is alert and oriented to person, place, and time.  Skin: Skin is warm and dry.  Psychiatric: He has a normal mood and affect. His behavior is normal. Judgment and thought content normal.  Vitals reviewed.    ASSESSMENT/PLAN:  1. Allergic state, subsequent encounter - cetirizine (ZYRTEC) 10 MG tablet; Take 1 tablet (10 mg total) by mouth daily.  Dispense: 30 tablet; Refill: 11  2. Essential hypertension The current medical regimen is effective;  continue present plan and medications. - Basic Metabolic Panel  3. Non-rheumatic mitral regurgitation The current medical regimen is effective;  continue present plan and medications.  4. Hyperlipidemia LDL goal <100 The current medical regimen is effective;  continue present plan and medications. - Basic Metabolic Panel  5. Right hip pain Review of xray from [place] on 05/04/2018 reveal possible osteochondroma. Will order xray of femur.  - XR FEMUR, MIN 2 VIEWS RIGHT; Future  6. Abnormal x-ray Review of xray from [facility] on 05/04/2018 reveal possible osteochondroma. Will order xray of femur.  - XR FEMUR, MIN 2 VIEWS RIGHT; Future    The patient is asked to make an attempt to improve diet and exercise patterns to aid in medical management of this problem.  Return to care as scheduled and prn. Patient verbalized understanding and agreed with plan of care.    Ms. Doug Sou. Nathaneil Canary, FNP-BC Patient [facility] [address] [place], [place] [address] [phone]

## 2018-05-22 ENCOUNTER — Ambulatory Visit: Payer: Self-pay | Admitting: Family Medicine

## 2018-05-22 LAB — BASIC METABOLIC PANEL
BUN/Creatinine Ratio: 13 (ref 9–20)
BUN: 19 mg/dL (ref 6–24)
CO2: 20 mmol/L (ref 20–29)
Calcium: 9 mg/dL (ref 8.7–10.2)
Chloride: 103 mmol/L (ref 96–106)
Creatinine, Ser: 1.45 mg/dL — ABNORMAL HIGH (ref 0.76–1.27)
GFR calc Af Amer: 63 mL/min/{1.73_m2} (ref >59)
GFR calc non Af Amer: 54 mL/min/{1.73_m2} — ABNORMAL LOW (ref >59)
Glucose: 86 mg/dL (ref 65–99)
Potassium: 4 mmol/L (ref 3.5–5.2)
Sodium: 139 mmol/L (ref 134–144)

## 2018-05-28 ENCOUNTER — Telehealth: Payer: Self-pay | Admitting: Family Medicine

## 2018-05-28 ENCOUNTER — Encounter: Payer: Self-pay | Admitting: Family Medicine

## 2018-05-28 NOTE — Telephone Encounter (Signed)
Please call the patient and let him know that the xray that he had at [facility] needs further evaluation or a different view.   I put in an order for his right femur to be imaged.

## 2018-07-10 ENCOUNTER — Encounter: Payer: Self-pay | Admitting: Cardiology

## 2018-08-20 ENCOUNTER — Ambulatory Visit: Payer: Self-pay | Admitting: Family Medicine

## 2018-08-24 ENCOUNTER — Other Ambulatory Visit: Payer: Self-pay | Admitting: Family Medicine

## 2018-08-24 MED FILL — CETIRIZINE HCL 10 MG TABLET: 10 | 30 days supply | Qty: 30 | Fill #0

## 2018-08-24 MED FILL — AMLODIPINE BESYLATE 10 MG T: 10 | 30 days supply | Qty: 30 | Fill #1

## 2018-08-24 MED FILL — FUROSEMIDE 40 MG TAB: 40 | 30 days supply | Qty: 30 | Fill #0

## 2018-08-24 MED FILL — LABETALOL HCL 200 MG TABLET: 200 | 30 days supply | Qty: 60 | Fill #1

## 2018-08-24 MED FILL — BENAZEPRIL HCL 40 MG TABLET: 40 | 30 days supply | Qty: 30 | Fill #1

## 2018-08-24 MED FILL — DOXAZOSIN MESYLATE 4 MG TAB: 4 | 30 days supply | Qty: 45 | Fill #0

## 2018-08-24 MED FILL — cloNIDine HCL 0.2 MG TABS: 0.2 | 30 days supply | Qty: 30 | Fill #1

## 2018-08-24 MED FILL — POTASSIUM CHLORIDE ER 10 ME: 10 | 30 days supply | Qty: 30 | Fill #0

## 2018-08-24 MED FILL — hydrALAZINE HCL 50 MG TABS: 50 | 30 days supply | Qty: 180 | Fill #1

## 2018-08-24 MED FILL — ISOSORBIDE MN ER 30 MG TAB: 30 | 30 days supply | Qty: 30 | Fill #1

## 2018-08-26 ENCOUNTER — Telehealth: Payer: Self-pay

## 2018-08-26 DIAGNOSIS — M109 Gout, unspecified: Secondary | ICD-10-CM

## 2018-08-26 MED ORDER — ALLOPURINOL 100 MG PO TABS: 100.0000 mg | ORAL_TABLET | Freq: Two times a day (BID) | ORAL | 5 refills | Status: DC

## 2018-08-26 NOTE — Telephone Encounter (Signed)
Refills for allopurinol sent into pharmacy. Thanks!

## 2018-08-28 ENCOUNTER — Ambulatory Visit: Payer: Self-pay | Admitting: Family Medicine

## 2018-09-02 ENCOUNTER — Ambulatory Visit (INDEPENDENT_AMBULATORY_CARE_PROVIDER_SITE_OTHER): Payer: Medicaid Other | Admitting: Family Medicine

## 2018-09-02 ENCOUNTER — Encounter: Payer: Self-pay | Admitting: Family Medicine

## 2018-09-02 VITALS — BP 152/100 | HR 75 | Temp 98.1°F | Resp 16 | Ht 73.0 in | Wt 226.0 lb

## 2018-09-02 DIAGNOSIS — I10 Essential (primary) hypertension: Secondary | ICD-10-CM

## 2018-09-02 LAB — POCT URINALYSIS DIPSTICK
Bilirubin, UA: NEGATIVE
Blood, UA: NEGATIVE
Glucose, UA: NEGATIVE
Ketones, UA: NEGATIVE
Nitrite, UA: NEGATIVE
Protein, UA: NEGATIVE
Spec Grav, UA: 1.01 (ref 1.010–1.025)
Urobilinogen, UA: 0.2 E.U./dL
pH, UA: 5.5 (ref 5.0–8.0)

## 2018-09-02 NOTE — Patient Instructions (Signed)

## 2018-09-02 NOTE — Progress Notes (Signed)
Patient [facility]   Progress Note: General Provider: Lanae Boast, FNP  SUBJECTIVE:   Daniel Joseph is a 57 y.o. male who  has a past medical history of Benign essential HTN (01/19/2014), Cardiomyopathy, dilated, nonischemic ([place]), Chronic combined systolic and diastolic CHF (congestive heart failure) ([place]) (01/21/2014), Gout, Mitral valve regurgitation, PVC's (premature ventricular contractions), and Tricuspid valve mass.. Patient presents today for Hypertension Patient presents for follow-up on hypertension.  Patient reports recently taking his blood pressure medications for the morning.  He is doing well today without complaints. He did not go to get the femur x-ray at the last visit and will do so today.  Medications have been refilled prior to today's visit.  Will order lipid panel today. Review of Systems  Constitutional: Negative.   HENT: Negative.   Eyes: Negative.   Respiratory: Negative.   Cardiovascular: Negative.   Gastrointestinal: Negative.   Genitourinary: Negative.   Musculoskeletal: Negative.   Skin: Negative.   Neurological: Negative.   Psychiatric/Behavioral: Negative.      OBJECTIVE: BP (!) 162/100 (BP Location: Right Arm, Patient Position: Sitting, Cuff Size: Large) Comment: manually  Pulse 75   Temp 98.1 F (36.7 C) (Oral)   Resp 16   Ht 6\' 1"  (1.854 m)   Wt 226 lb (102.5 kg)   SpO2 100%   BMI 29.82 kg/m   Wt Readings from Last 3 Encounters:  09/02/18 226 lb (102.5 kg)  05/21/18 228 lb (103.4 kg)  03/18/18 231 lb (104.8 kg)     Physical Exam Vitals signs and nursing note reviewed.  Constitutional:      General: He is not in acute distress.    Appearance: He is well-developed.  HENT:     Head: Normocephalic and atraumatic.  Eyes:     Conjunctiva/sclera: Conjunctivae normal.     Pupils: Pupils are equal, round, and reactive to light.  Neck:     Musculoskeletal: Normal range of motion.    Cardiovascular:     Rate and Rhythm: Normal rate and regular rhythm.     Heart sounds: Normal heart sounds.  Pulmonary:     Effort: Pulmonary effort is normal. No respiratory distress.     Breath sounds: Normal breath sounds.  Abdominal:     General: Bowel sounds are normal. There is no distension.     Palpations: Abdomen is soft.  Musculoskeletal: Normal range of motion.  Skin:    General: Skin is warm and dry.  Neurological:     Mental Status: He is alert and oriented to person, place, and time.  Psychiatric:        Behavior: Behavior normal.        Thought Content: Thought content normal.     ASSESSMENT/PLAN:   1. Essential hypertension Continue with current medications as previously prescribed.  Discussed diet and exercise.  Labs ordered today for yearly lipid panel and BMP. - Urinalysis Dipstick - Lipid Panel - Basic Metabolic Panel  Patient to get x-ray of femur for follow-up today.      The patient was given clear instructions to go to ER or return to medical center if symptoms do not improve, worsen or new problems develop. The patient verbalized understanding and agreed with plan of care.   Ms. Doug Sou. Nathaneil Canary, FNP-BC Patient [facility] [address] [place],  [address] [phone]     This note has been created with Dragon speech recognition software and  smart Company secretary. Any transcriptional errors are unintentional.

## 2018-09-03 LAB — LIPID PANEL
Chol/HDL Ratio: 2.7 ratio (ref 0.0–5.0)
Cholesterol, Total: 145 mg/dL (ref 100–199)
HDL: 54 mg/dL (ref >39)
LDL Calculated: 75 mg/dL (ref 0–99)
Triglycerides: 82 mg/dL (ref 0–149)
VLDL Cholesterol Cal: 16 mg/dL (ref 5–40)

## 2018-09-03 LAB — BASIC METABOLIC PANEL
BUN/Creatinine Ratio: 9 (ref 9–20)
BUN: 10 mg/dL (ref 6–24)
CO2: 20 mmol/L (ref 20–29)
Calcium: 9.6 mg/dL (ref 8.7–10.2)
Chloride: 106 mmol/L (ref 96–106)
Creatinine, Ser: 1.14 mg/dL (ref 0.76–1.27)
GFR calc Af Amer: 83 mL/min/{1.73_m2} (ref >59)
GFR calc non Af Amer: 72 mL/min/{1.73_m2} (ref >59)
Glucose: 73 mg/dL (ref 65–99)
Potassium: 4.4 mmol/L (ref 3.5–5.2)
Sodium: 143 mmol/L (ref 134–144)

## 2018-10-23 ENCOUNTER — Ambulatory Visit (HOSPITAL_COMMUNITY): Payer: Medicaid Other | Attending: Cardiology

## 2018-10-23 DIAGNOSIS — I340 Nonrheumatic mitral (valve) insufficiency: Secondary | ICD-10-CM | POA: Diagnosis not present

## 2018-10-27 ENCOUNTER — Telehealth: Payer: Self-pay | Admitting: Cardiology

## 2018-10-27 DIAGNOSIS — I340 Nonrheumatic mitral (valve) insufficiency: Secondary | ICD-10-CM

## 2018-10-27 DIAGNOSIS — I5042 Chronic combined systolic (congestive) and diastolic (congestive) heart failure: Secondary | ICD-10-CM

## 2018-10-27 NOTE — Telephone Encounter (Signed)
Spoke with the patient, he expressed understanding about his results and he had no further questions. He is schedule for a BNP on 2/14. Order for repeat echo placed.

## 2018-10-27 NOTE — Telephone Encounter (Signed)
Left a message to call back.

## 2018-10-27 NOTE — Telephone Encounter (Signed)
Notes recorded by Sueanne Margarita, MD on 10/23/2018 at 5:00 PM EST Please have him come in for a BNP to make sure he is not volume overloaded. Please repeat echo in 1 year for mitral regurgitation ------  Notes recorded by Sueanne Margarita, MD on 10/23/2018 at 4:56 PM EST Please let patient know that heart function is low normal with moderately enlarged heart, severely enlarged left atrium, mild mitral valve prolapse with moderate leakiness the mitral valve. 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 There is also moderate pulmonary hypertension with PASP at least 53 mmHg.

## 2018-10-27 NOTE — Telephone Encounter (Signed)
New Message  PT is calling to get results from Echo on 02/07  Please call

## 2018-10-30 ENCOUNTER — Other Ambulatory Visit: Payer: Medicaid Other | Admitting: *Deleted

## 2018-10-30 DIAGNOSIS — I5042 Chronic combined systolic (congestive) and diastolic (congestive) heart failure: Secondary | ICD-10-CM

## 2018-10-31 LAB — PRO B NATRIURETIC PEPTIDE: NT-Pro BNP: 595 pg/mL — ABNORMAL HIGH (ref 0–210)

## 2018-11-02 ENCOUNTER — Telehealth: Payer: Self-pay

## 2018-11-02 DIAGNOSIS — I10 Essential (primary) hypertension: Secondary | ICD-10-CM

## 2018-11-02 DIAGNOSIS — I5042 Chronic combined systolic (congestive) and diastolic (congestive) heart failure: Secondary | ICD-10-CM

## 2018-11-02 MED ORDER — FUROSEMIDE 40 MG PO TABS: 40.0000 mg | ORAL_TABLET | Freq: Two times a day (BID) | ORAL | 3 refills | Status: DC

## 2018-11-02 NOTE — Telephone Encounter (Signed)
-----   Message from Sueanne Margarita, MD sent at 11/01/2018  9:04 AM EST ----- BNP elevated - please have him increase Lasix to 40mg  BID and come in for BMET and BNP on Friday 2/20

## 2018-11-02 NOTE — Telephone Encounter (Signed)
Spoke with the patient, he expressed understanding about his lasix increase and will come for labs on 2/21.

## 2018-11-03 MED FILL — FUROSEMIDE 40 MG TAB: 40 | 90 days supply | Qty: 180 | Fill #0

## 2018-11-04 MED FILL — ALLOPURINOL 100 MG TABLET: 100 | 30 days supply | Qty: 60 | Fill #0

## 2018-11-04 MED FILL — cloNIDine HCL 0.2 MG TABS: 0.2 | 30 days supply | Qty: 30 | Fill #2

## 2018-11-04 MED FILL — BENAZEPRIL HCL 40 MG TABLET: 40 | 30 days supply | Qty: 30 | Fill #2

## 2018-11-04 MED FILL — AMLODIPINE BESYLATE 10 MG T: 10 | 30 days supply | Qty: 30 | Fill #2

## 2018-11-06 ENCOUNTER — Other Ambulatory Visit: Payer: Medicaid Other | Admitting: *Deleted

## 2018-11-06 DIAGNOSIS — I5042 Chronic combined systolic (congestive) and diastolic (congestive) heart failure: Secondary | ICD-10-CM | POA: Diagnosis not present

## 2018-11-06 DIAGNOSIS — I10 Essential (primary) hypertension: Secondary | ICD-10-CM

## 2018-11-07 LAB — BASIC METABOLIC PANEL
BUN/Creatinine Ratio: 12 (ref 9–20)
BUN: 15 mg/dL (ref 6–24)
CO2: 24 mmol/L (ref 20–29)
Calcium: 9 mg/dL (ref 8.7–10.2)
Chloride: 103 mmol/L (ref 96–106)
Creatinine, Ser: 1.23 mg/dL (ref 0.76–1.27)
GFR calc Af Amer: 76 mL/min/{1.73_m2} (ref >59)
GFR calc non Af Amer: 66 mL/min/{1.73_m2} (ref >59)
Glucose: 79 mg/dL (ref 65–99)
Potassium: 3.8 mmol/L (ref 3.5–5.2)
SODIUM: 143 mmol/L (ref 134–144)

## 2018-11-07 LAB — PRO B NATRIURETIC PEPTIDE: NT-PRO BNP: 1053 pg/mL — AB (ref 0–210)

## 2018-11-11 ENCOUNTER — Ambulatory Visit: Payer: Medicaid Other | Admitting: Physician Assistant

## 2018-11-11 ENCOUNTER — Encounter: Payer: Self-pay | Admitting: Physician Assistant

## 2018-11-11 VITALS — BP 112/60 | HR 68 | Ht 73.0 in | Wt 226.0 lb

## 2018-11-11 DIAGNOSIS — I340 Nonrheumatic mitral (valve) insufficiency: Secondary | ICD-10-CM | POA: Diagnosis not present

## 2018-11-11 DIAGNOSIS — I472 Ventricular tachycardia: Secondary | ICD-10-CM | POA: Diagnosis not present

## 2018-11-11 DIAGNOSIS — I5042 Chronic combined systolic (congestive) and diastolic (congestive) heart failure: Secondary | ICD-10-CM

## 2018-11-11 DIAGNOSIS — I078 Other rheumatic tricuspid valve diseases: Secondary | ICD-10-CM

## 2018-11-11 DIAGNOSIS — I4729 Other ventricular tachycardia: Secondary | ICD-10-CM

## 2018-11-11 DIAGNOSIS — F1010 Alcohol abuse, uncomplicated: Secondary | ICD-10-CM

## 2018-11-11 DIAGNOSIS — I272 Pulmonary hypertension, unspecified: Secondary | ICD-10-CM | POA: Diagnosis not present

## 2018-11-11 LAB — BASIC METABOLIC PANEL
BUN/Creatinine Ratio: 12 (ref 9–20)
BUN: 17 mg/dL (ref 6–24)
CO2: 18 mmol/L — ABNORMAL LOW (ref 20–29)
Calcium: 9.1 mg/dL (ref 8.7–10.2)
Chloride: 105 mmol/L (ref 96–106)
Creatinine, Ser: 1.39 mg/dL — ABNORMAL HIGH (ref 0.76–1.27)
GFR calc non Af Amer: 57 mL/min/{1.73_m2} — ABNORMAL LOW (ref >59)
GFR, EST AFRICAN AMERICAN: 65 mL/min/{1.73_m2} (ref >59)
Glucose: 93 mg/dL (ref 65–99)
Potassium: 3.6 mmol/L (ref 3.5–5.2)
Sodium: 140 mmol/L (ref 134–144)

## 2018-11-11 LAB — PRO B NATRIURETIC PEPTIDE: NT-PRO BNP: 564 pg/mL — AB (ref 0–210)

## 2018-11-11 NOTE — Progress Notes (Signed)
Cardiology Office Note:    Date:  11/11/2018   ID:  Daniel Joseph, DOB May 18, 1961, MRN [id number]  PCP:  Lanae Boast, FNP  Cardiologist:  Fransico Him, MD   Electrophysiologist:  None   Referring MD: Lanae Boast, [place]   Chief Complaint  Patient presents with  . Congestive Heart Failure    follow up     History of Present Illness:    Daniel Joseph is a 58 y.o. 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 He was initially seen in 01/2014. Echocardiogram demonstrated EF 35-40% and grade 2 diastolic dysfunction as well as an abnormality on the tricuspid valve concerning for mass or vegetation. He is on multiple antihypertensive medications. 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 Repeat TEE in 08/2014 demonstrated stability of the abnormality on the tricuspid valve without evidence of enlargement.  He saw Dr. Lovena Le in 2016 for evaluation of NSVT.  Dr. Lovena Le did not recommend EPS or ICD at that time.  He last saw Dr. [name] in August 2018.  His most recent echocardiogram in February 2020 demonstrated normal LV function, mod MR and moderate pulmonary hypertension.  A BNP was ordered.  This was elevated and his Lasix was adjusted.   Daniel Joseph returns for follow-up on congestive heart failure.  He is here alone  He has a lot of trouble with sciatica on the R.  He feels he had to work harder to do activity b/c of the pain. He does feel that he is more short of breath over the past 2 weeks.  He had not noticed much difference with the increased dose of Lasix.  He just increased it to twice daily about 1 week ago.  He has not has leg swelling, orthopnea, chest pain.  He is short of breath with more moderate to extreme activities.  He has noted paroxysmal nocturnal dyspnea in the past.  He denies syncope.  Of note, he feels that he  drinks too much beer.    Prior CV studies:   The following studies were reviewed today:  Echocardiogram 10/23/2018 EF 50-55, mild LVH, severe LAE, mild RAE, mild mitral valve prolapse, mild MAC, moderate MR, mobile density on tricuspid valve (possible fibroblastoma), moderate pulmonary hypertension   Echocardiogram 11/03/2017 EF 50-55, possible inferior and inferoseptal HK, mild late anterior leaflet systolic prolapse, mild to moderate MR, severe LAE, moderate RAE  Renal artery ultrasound 02/21/2015 Normal caliber abdominal aorta, normal renal arteries bilaterally  Myoview 01/17/2015 Conclusion:  There is a small, moderately severe defect in the inferoapical region at stress and rest. 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 The SDS = 3. There is no reversible ischemia. There is moderate global hypokinesis of the LV  TEE 09/14/2014 EF 40-45, normal wall motion, mild to moderate MR, smooth spherical mass on the atrial side of tricuspid valve leaflet attached broadbase most consistent with myxoma or fibroblastoma, mild TR  Renal Artery [place] 6/15 Normal abdominal aorta. Normal renal arteries bilaterally.    Past Medical History:  Diagnosis Date  . Benign essential HTN 01/19/2014   Renal Artery [place] 6/16:  No RAS, No AAA  . Cardiomyopathy, dilated, nonischemic (HCC)    EF 45-50% bye echo 08/2015 and EF 50-55% by echo 10/2017  . Chronic combined systolic and diastolic CHF (congestive heart failure) ([place]) 01/21/2014  . Gout   . Mitral valve regurgitation  mild to moderate MR with MVP of ANMVL by echo 2019  . PVC's (premature ventricular contractions)    nonsustained VT as well as PVCs - no ICD indicated due to EF 40-45%  . 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   Current  Medications: Current Meds  Medication Sig  . allopurinol (ZYLOPRIM) 100 MG tablet Take 1 tablet (100 mg total) by mouth 2 (two) times daily.  Marland Kitchen amLODipine (NORVASC) 10 MG tablet Take 1 tablet (10 mg total) by mouth daily.  Marland Kitchen aspirin EC 81 MG tablet Take 1 tablet (81 mg total) by mouth daily.  . benazepril (LOTENSIN) 40 MG tablet Take 1 tablet (40 mg total) by mouth daily.  . cetirizine (ZYRTEC) 10 MG tablet Take 1 tablet (10 mg total) by mouth daily.  . cloNIDine (CATAPRES) 0.2 MG tablet Take 1 tablet (0.2 mg total) by mouth daily.  Marland Kitchen doxazosin (CARDURA) 4 MG tablet Take 1.5 tablets (6 mg total) by mouth at bedtime.  . furosemide (LASIX) 40 MG tablet Take 1 tablet (40 mg total) by mouth 2 (two) times daily.  Marland Kitchen gabapentin (NEURONTIN) 300 MG capsule Take 1 capsule (300 mg total) by mouth 3 (three) times daily.  . hydrALAZINE (APRESOLINE) 50 MG tablet TAKE 2 TABLETS BY MOUTH 3 TIMES DAILY  . isosorbide mononitrate (IMDUR) 30 MG 24 hr tablet Take 1 tablet (30 mg total) by mouth daily.  Marland Kitchen labetalol (NORMODYNE) 200 MG tablet Take 1 tablet (200 mg total) by mouth 2 (two) times daily.  . potassium chloride (K-DUR) 10 MEQ tablet TAKE 1 TABLET BY MOUTH DAILY.  Marland Kitchen spironolactone (ALDACTONE) 25 MG tablet Take 25 mg by mouth daily.  . tizanidine (ZANAFLEX) 2 MG capsule Take 1 capsule (2 mg total) by mouth 3 (three) times daily.  . traMADol (ULTRAM) 50 MG tablet Take 1 tablet (50 mg total) by mouth every 8 (eight) hours as needed for severe pain.  Marland Kitchen triamcinolone cream (KENALOG) 0.1 % Apply 1 application topically 2 (two) times daily.   Current Facility-Administered Medications for the 11/11/18 encounter (Office Visit) with Richardson Dopp T, PA-C  Medication  . 0.9 %  sodium chloride infusion     Allergies:   Patient has no known allergies.   Social History   Tobacco Use  . Smoking status: Never Smoker  . Smokeless tobacco: Never Used  Substance Use Topics  . Alcohol use: No  . Drug use: No      Family Hx: The patient's family history includes Heart Problems in his brother, father, and mother; Heart attack in his brother and father; Heart failure in his mother; Hypertension in his brother, father, and mother; Stroke in his mother. There is no history of Colon cancer, Esophageal cancer, or Stomach cancer.  ROS:   Please see the history of present illness.    Review of Systems  Eyes: Positive for visual disturbance.  Cardiovascular: Positive for dyspnea on exertion.  Respiratory: Positive for shortness of breath.   Musculoskeletal: Positive for back pain, joint pain and myalgias.  Gastrointestinal: Positive for diarrhea.   All other systems reviewed and are negative.   EKGs/Labs/Other Test Reviewed:    EKG:  EKG is  ordered today.  The ekg ordered today demonstrates normal sinus rhythm, HR 65, LAD, PVC, LVH, QTc 422 ms, similar to old  ECG.  Recent Labs: 11/11/2018: BUN 17; Creatinine, Ser 1.39; NT-Pro BNP 564; Potassium 3.6; Sodium 140   Recent Lipid Panel Lab Results  Component Value Date/Time   CHOL 145 09/02/2018 09:50 AM   TRIG 82 09/02/2018 09:50 AM   HDL 54 09/02/2018 09:50 AM   CHOLHDL 2.7 09/02/2018 09:50 AM   CHOLHDL 3.5 08/13/2017 08:48 AM   LDLCALC 75 09/02/2018 09:50 AM   LDLCALC 94 08/13/2017 08:48 AM    Physical Exam:    VS:  BP 112/60   Pulse 68   Ht _0  (1.854 m)   Wt 226 lb (102.5 kg)   SpO2 98%   BMI 29.82 kg/m     Wt Readings from Last 3 Encounters:  11/11/18 226 lb (102.5 kg)  09/02/18 226 lb (102.5 kg)  05/21/18 228 lb (103.4 kg)     Physical Exam  Constitutional: He is oriented to person, place, and time. He appears well-developed and well-nourished. No distress.  HENT:  Head: Normocephalic and atraumatic.  Eyes: No scleral icterus.  Neck: No JVD present. No thyromegaly present.  Cardiovascular: Normal rate and regular rhythm.  Murmur heard.  Holosystolic murmur is present with a grade of 2/6 at the lower left sternal  border. Pulmonary/Chest: Effort normal. He has no rales.  Abdominal: Soft. He exhibits no distension.  Musculoskeletal:        General: No edema.  Lymphadenopathy:    He has no cervical adenopathy.  Neurological: He is alert and oriented to person, place, and time.  Skin: Skin is warm and dry.  Psychiatric: He has a normal mood and affect.    ASSESSMENT & PLAN:    Chronic combined systolic and diastolic CHF (congestive heart failure) (HCC)  EF was 40-45 but has improved to normal.  Most recent echo with normal EF.  He feels more short of breath lately.  He is NYHA 2.  His exam does not suggest volume excess.  His recent BNP was elevated.  He has been on higher dose Lasix for about 1 week.  For now, I have suggested we continue his current medications.  Will obtain follow up BMET, BNP today.  FU with Dr. [name] or me in 4-6 weeks.   Pulmonary hypertension, unspecified ([place]) PASP is approx 51 by recent echo.  Question if this is 2/2 CHF.  Continue diuresis.  If shortness of breath does not improve, consider repeat echo to reassess PASP.  If PASP remains elevated, consider further evaluation.  Nonrheumatic mitral valve regurgitation  Mod by recent echo.  Nonsustained paroxysmal ventricular tachycardia (HCC)  No further testing as EF remains > 35.  Tricuspid valve mass - Stable by recent echo.  Alcohol abuse Question if volume excess may be related due to excess fluid intake.  I have asked him to reduce his alcohol intake.     Dispo:  Return in about 4 weeks (around 12/09/2018) for Close Follow Up w/ Dr. [name], or Richardson Dopp, PA-C.   Medication Adjustments/Labs and Tests Ordered: Current medicines are reviewed at length with the patient today.  Concerns regarding medicines are outlined above.  Tests Ordered: Orders Placed This Encounter  Procedures  . Basic metabolic panel  . Pro b natriuretic peptide (BNP)  . EKG 12-Lead   Medication Changes: No orders of the defined types  were placed in this encounter.   Signed, Richardson Dopp, PA-C  11/11/2018 4:44 PM    [facility] [place], [place], [place]  [address]  Phone: [phone]; Fax: [phone]

## 2018-11-11 NOTE — Patient Instructions (Signed)
Medication Instructions:  Your physician recommends that you continue on your current medications as directed. Please refer to the Current Medication list given to you today.  If you need a refill on your cardiac medications before your next appointment, please call your pharmacy.   Lab work: TODAY: BMET, BNP  If you have labs (blood work) drawn today and your tests are completely normal, you will receive your results only by: Marland Kitchen MyChart Message (if you have MyChart) OR . A paper copy in the mail If you have any lab test that is abnormal or we need to change your treatment, we will call you to review the results.  Testing/Procedures: NONE  Follow-Up: At [facility], you and your health needs are our priority.  As part of our continuing mission to provide you with exceptional heart care, we have created designated Provider Care Teams.  These Care Teams include your primary Cardiologist (physician) and Advanced Practice Providers (APPs -  Physician Assistants and Nurse Practitioners) who all work together to provide you with the care you need, when you need it. You will need a follow up appointment in 4-6 weeks.  You may see Fransico Him, MD or Richardson Dopp, PA-C when Dr. [name] is in the office.  Any Other Special Instructions Will Be Listed Below (If Applicable).

## 2018-11-12 ENCOUNTER — Telehealth: Payer: Self-pay

## 2018-11-12 DIAGNOSIS — I5042 Chronic combined systolic (congestive) and diastolic (congestive) heart failure: Secondary | ICD-10-CM

## 2018-11-12 MED ORDER — FUROSEMIDE 40 MG PO TABS: 60.0000 mg | ORAL_TABLET | Freq: Every day | ORAL | 3 refills | Status: DC

## 2018-11-12 MED FILL — FUROSEMIDE 40 MG TAB: 40 | 90 days supply | Qty: 135 | Fill #0

## 2018-11-12 NOTE — Telephone Encounter (Signed)
Reviewed results with pt who verbalized understanding. Per Richardson Dopp, PA-C to have pt decrease lasix to 60 mg qd and obtain a repeat bmet in 1 week. Lab appt is scheduled for 3/5. Med has been updated and orders have been placed in Epic.

## 2018-11-14 ENCOUNTER — Encounter: Payer: Self-pay | Admitting: Gastroenterology

## 2018-11-16 ENCOUNTER — Telehealth: Payer: Self-pay | Admitting: Physician Assistant

## 2018-11-16 ENCOUNTER — Ambulatory Visit: Payer: Self-pay | Admitting: Cardiology

## 2018-11-16 DIAGNOSIS — I517 Cardiomegaly: Secondary | ICD-10-CM

## 2018-11-16 DIAGNOSIS — I5042 Chronic combined systolic (congestive) and diastolic (congestive) heart failure: Secondary | ICD-10-CM

## 2018-11-16 DIAGNOSIS — I272 Pulmonary hypertension, unspecified: Secondary | ICD-10-CM

## 2018-11-16 NOTE — Telephone Encounter (Signed)
Please tell patient I reviewed his echo with Dr. [name]. We want to get some follow up tests on his heart. 1. Schedule for a limited echo to recheck PASP/pulmonary hypertension.  This should be done 1 week before his next appointment with me in March.   2. Schedule a PYP Scan (Tc-99 Cardiac Amyloid Scan). I have placed orders for both tests. Thanks. Richardson Dopp, PA-C    11/16/2018 4:56 PM

## 2018-11-17 ENCOUNTER — Telehealth: Payer: Self-pay | Admitting: *Deleted

## 2018-11-17 NOTE — Telephone Encounter (Signed)
SPOKE WITH PT AND PT AWARE OF FOLLOW UP RECOMMEND TESTS ECHO AND SCAN.  PT WAS TOLD AN SCHEDULER WILL BE CONTACTING HIM BACK TO SCHEDULE THESE APPOINTMENTS.  PT VEBALIZED UNDERSTANDING

## 2018-11-19 ENCOUNTER — Other Ambulatory Visit: Payer: Medicaid Other | Admitting: *Deleted

## 2018-11-19 DIAGNOSIS — I5042 Chronic combined systolic (congestive) and diastolic (congestive) heart failure: Secondary | ICD-10-CM

## 2018-11-19 LAB — BASIC METABOLIC PANEL
BUN/Creatinine Ratio: 9 (ref 9–20)
BUN: 14 mg/dL (ref 6–24)
CO2: 25 mmol/L (ref 20–29)
Calcium: 9.4 mg/dL (ref 8.7–10.2)
Chloride: 99 mmol/L (ref 96–106)
Creatinine, Ser: 1.5 mg/dL — ABNORMAL HIGH (ref 0.76–1.27)
GFR calc Af Amer: 60 mL/min/{1.73_m2} (ref >59)
GFR calc non Af Amer: 52 mL/min/{1.73_m2} — ABNORMAL LOW (ref >59)
Glucose: 62 mg/dL — ABNORMAL LOW (ref 65–99)
Potassium: 3.6 mmol/L (ref 3.5–5.2)
Sodium: 140 mmol/L (ref 134–144)

## 2018-11-24 ENCOUNTER — Encounter: Payer: Self-pay | Admitting: Physician Assistant

## 2018-11-24 ENCOUNTER — Telehealth: Payer: Self-pay | Admitting: *Deleted

## 2018-11-24 DIAGNOSIS — I5042 Chronic combined systolic (congestive) and diastolic (congestive) heart failure: Secondary | ICD-10-CM

## 2018-11-24 MED ORDER — FUROSEMIDE 40 MG PO TABS: 40.0000 mg | ORAL_TABLET | Freq: Every day | ORAL | 3 refills | Status: DC

## 2018-11-24 NOTE — Telephone Encounter (Signed)
SPOKE WITH PT AND PT VERBALIZED UNDERSTANDING MEDICATIONS HOLDS FOR ONE DAY AND RESUME LASIX AT 40 MG DAILY WITH REPEAT BMET NEXT WED 18

## 2018-11-24 NOTE — Telephone Encounter (Signed)
-----   Message from Liliane Shi, [place] sent at 11/19/2018  4:47 PM EST ----- Creatinine increased.  K+ normal.  Glucose low.   Recommendations:  - Hold Lasix, Spironolactone, Benazepril x 1 day.  - Then, resume Spironolactone, Benazepril at current dose  - After holding 1 day, resume Lasix at decreased dose of 40 mg once daily.  - BMET 1 week Richardson Dopp, PA-C    11/19/2018 4:44 PM

## 2018-11-27 ENCOUNTER — Encounter (HOSPITAL_COMMUNITY): Payer: Medicaid Other

## 2018-11-30 ENCOUNTER — Other Ambulatory Visit (HOSPITAL_COMMUNITY): Payer: Medicaid Other

## 2018-12-02 ENCOUNTER — Ambulatory Visit (INDEPENDENT_AMBULATORY_CARE_PROVIDER_SITE_OTHER): Payer: Medicaid Other | Admitting: Family Medicine

## 2018-12-02 ENCOUNTER — Encounter: Payer: Self-pay | Admitting: Family Medicine

## 2018-12-02 ENCOUNTER — Telehealth: Payer: Self-pay | Admitting: Internal Medicine

## 2018-12-02 ENCOUNTER — Other Ambulatory Visit: Payer: Medicaid Other

## 2018-12-02 ENCOUNTER — Other Ambulatory Visit: Payer: Self-pay

## 2018-12-02 VITALS — BP 139/93 | HR 74 | Temp 98.0°F | Ht 70.0 in | Wt 226.0 lb

## 2018-12-02 DIAGNOSIS — G629 Polyneuropathy, unspecified: Secondary | ICD-10-CM | POA: Diagnosis not present

## 2018-12-02 DIAGNOSIS — M25551 Pain in right hip: Secondary | ICD-10-CM

## 2018-12-02 DIAGNOSIS — G8929 Other chronic pain: Secondary | ICD-10-CM | POA: Diagnosis not present

## 2018-12-02 DIAGNOSIS — L309 Dermatitis, unspecified: Secondary | ICD-10-CM

## 2018-12-02 DIAGNOSIS — I10 Essential (primary) hypertension: Secondary | ICD-10-CM

## 2018-12-02 LAB — POCT URINALYSIS DIP (CLINITEK)
Bilirubin, UA: NEGATIVE
Blood, UA: NEGATIVE
Glucose, UA: NEGATIVE mg/dL
Ketones, POC UA: NEGATIVE mg/dL
Leukocytes, UA: NEGATIVE
Nitrite, UA: NEGATIVE
POC PROTEIN,UA: NEGATIVE
Spec Grav, UA: 1.01 (ref 1.010–1.025)
Urobilinogen, UA: 0.2 E.U./dL
pH, UA: 6 (ref 5.0–8.0)

## 2018-12-02 MED ORDER — TRAMADOL HCL 50 MG PO TABS: 50.0000 mg | ORAL_TABLET | Freq: Three times a day (TID) | ORAL | 0 refills | Status: DC | PRN

## 2018-12-02 MED ORDER — GABAPENTIN 300 MG PO CAPS: 300.0000 mg | ORAL_CAPSULE | Freq: Three times a day (TID) | ORAL | 3 refills | Status: DC

## 2018-12-02 MED ORDER — TRIAMCINOLONE ACETONIDE 0.1 % EX CREA: 1.0000 "application " | TOPICAL_CREAM | Freq: Two times a day (BID) | CUTANEOUS | 0 refills | Status: DC

## 2018-12-02 NOTE — Patient Instructions (Signed)

## 2018-12-02 NOTE — Progress Notes (Signed)
  Patient [name]   Progress Note: General Provider: Lanae Boast, FNP  SUBJECTIVE:   Daniel Joseph is a 58 y.o. male who  has a past medical history of Benign essential HTN (01/19/2014), Cardiomyopathy, dilated, nonischemic ([place]), Chronic combined systolic and diastolic CHF (congestive heart failure) ([place]) (01/21/2014), Gout, Mitral valve regurgitation, PVC's (premature ventricular contractions), and Tricuspid valve mass.. Patient presents today for Follow-up Patient seen by heart care by Dr. [name], or Richardson Dopp, PA-C. Hehad an increase in SOB and had lasix increased. He reported to heart care that he has been drinking too much beer recently. Heart Care provider believes this may be the reason for volume excess. He reports cutting back on his alcohol intake since his last visit. He denies edema of the lower extremities or SOB at the present time.  He denies problems or concerns at the present time.     ROS   OBJECTIVE: BP (!) 139/93   Pulse 74   Temp 98 F (36.7 C)   Ht 5\' 10"  (1.778 m)   Wt 226 lb (102.5 kg)   SpO2 100%   BMI 32.43 kg/m   Wt Readings from Last 3 Encounters:  12/02/18 226 lb (102.5 kg)  11/11/18 226 lb (102.5 kg)  09/02/18 226 lb (102.5 kg)     Physical Exam  Constitutional: He is oriented to person, place, and time. He appears well-developed and well-nourished. No distress.  HENT:  Head: Normocephalic and atraumatic.  Eyes: No scleral icterus.  Neck: No JVD present. No thyromegaly present.  Cardiovascular: Normal rate and regular rhythm.  Murmur heard.  Holosystolic murmur is present with a grade of 2/6 at the lower left sternal border. Pulmonary/Chest: Effort normal. He has no rales.  Abdominal: Soft. He exhibits no distension.  Musculoskeletal:        General: No edema.  Lymphadenopathy:    He has no cervical adenopathy.  Neurological: He is alert and oriented to person, place, and time.  Skin:  Skin is warm and dry.  Psychiatric: He has a normal mood and affect.  ASSESSMENT/PLAN:  1. Essential hypertension - POCT URINALYSIS DIP (CLINITEK)  2. Neuropathy - gabapentin (NEURONTIN) 300 MG capsule; Take 1 capsule (300 mg total) by mouth 3 (three) times daily.  Dispense: 90 capsule; Refill: 3  3. Chronic right hip pain - traMADol (ULTRAM) 50 MG tablet; Take 1 tablet (50 mg total) by mouth every 8 (eight) hours as needed for severe pain.  Dispense: 30 tablet; Refill: 0  4. Dermatitis - triamcinolone cream (KENALOG) 0.1 %; Apply 1 application topically 2 (two) times daily.  Dispense: 30 g; Refill: 0   Summary: Patient with a significant cardiac history presents today for medication refills. He is followed by Dr. [name], or Richardson Dopp, PA-C in heart care. He has recently decreased his alcohol intake and denies further edema of the extremities. He has a follow up with heart care in the next week. Medication refilled today.    Return in about 3 months (around 03/04/2019) for htn.    The patient was given clear instructions to go to ER or return to medical center if symptoms do not improve, worsen or new problems develop. The patient verbalized understanding and agreed with plan of care.   Ms. Doug Sou. Nathaneil Canary, FNP-BC [facility] [address] [place], [place] [address] [phone]

## 2018-12-02 NOTE — Telephone Encounter (Signed)
Spoke to patient  I have reviewed record  Due for labs, limited echo and PYP scan Would recomm rescheduling them from tomorrorow to early May when viral situation improves   Will  Need to make f/u with Kathleen Argue right after as well. Some from office with call

## 2018-12-03 ENCOUNTER — Encounter (HOSPITAL_COMMUNITY): Payer: Medicaid Other

## 2018-12-03 ENCOUNTER — Ambulatory Visit (HOSPITAL_COMMUNITY): Payer: Medicaid Other

## 2018-12-03 ENCOUNTER — Other Ambulatory Visit: Payer: Medicaid Other

## 2018-12-14 ENCOUNTER — Ambulatory Visit: Payer: Medicaid Other | Admitting: Physician Assistant

## 2018-12-31 ENCOUNTER — Telehealth: Payer: Self-pay

## 2019-01-01 NOTE — Telephone Encounter (Signed)
Called, no answer. Left a message for patient to call back. Thanks!  

## 2019-01-12 NOTE — Telephone Encounter (Signed)
Message sent to provider 

## 2019-01-14 ENCOUNTER — Telehealth: Payer: Self-pay

## 2019-01-14 DIAGNOSIS — G629 Polyneuropathy, unspecified: Secondary | ICD-10-CM

## 2019-01-14 MED ORDER — GABAPENTIN 300 MG PO CAPS: 300.0000 mg | ORAL_CAPSULE | Freq: Three times a day (TID) | ORAL | 3 refills | Status: DC

## 2019-01-14 MED ORDER — CLONIDINE HCL 0.2 MG PO TABS: 0.2000 mg | ORAL_TABLET | Freq: Every day | ORAL | 3 refills | Status: DC

## 2019-01-14 MED ORDER — BENAZEPRIL HCL 40 MG PO TABS: 40.0000 mg | ORAL_TABLET | Freq: Every day | ORAL | 6 refills | Status: DC

## 2019-01-14 MED FILL — cloNIDine HCL 0.2 MG TABS: 0.2 | 30 days supply | Qty: 30 | Fill #0

## 2019-01-14 MED FILL — GABAPENTIN 300 MG CAPSULE: 300 | 30 days supply | Qty: 90 | Fill #0

## 2019-01-14 MED FILL — BENAZEPRIL HCL 40 MG TABLET: 40 | 30 days supply | Qty: 30 | Fill #0

## 2019-01-14 NOTE — Telephone Encounter (Signed)
Refills sent into pharmacy. Thanks!  

## 2019-01-20 ENCOUNTER — Other Ambulatory Visit: Payer: Self-pay

## 2019-01-20 ENCOUNTER — Ambulatory Visit (HOSPITAL_BASED_OUTPATIENT_CLINIC_OR_DEPARTMENT_OTHER): Payer: Medicaid Other

## 2019-01-20 ENCOUNTER — Other Ambulatory Visit: Payer: Medicaid Other | Admitting: *Deleted

## 2019-01-20 ENCOUNTER — Encounter (HOSPITAL_COMMUNITY): Payer: Medicaid Other

## 2019-01-20 ENCOUNTER — Encounter: Payer: Self-pay | Admitting: Physician Assistant

## 2019-01-20 ENCOUNTER — Ambulatory Visit (HOSPITAL_COMMUNITY): Payer: Medicaid Other | Attending: Internal Medicine

## 2019-01-20 DIAGNOSIS — I5042 Chronic combined systolic (congestive) and diastolic (congestive) heart failure: Secondary | ICD-10-CM

## 2019-01-20 DIAGNOSIS — I272 Pulmonary hypertension, unspecified: Secondary | ICD-10-CM

## 2019-01-20 DIAGNOSIS — I517 Cardiomegaly: Secondary | ICD-10-CM | POA: Diagnosis not present

## 2019-01-20 MED ORDER — TECHNETIUM TC 99M PYROPHOSPHATE
19.1000 | Freq: Once | INTRAVENOUS | Status: AC
  Administered 2019-01-20: 19.1 via INTRAVENOUS

## 2019-01-21 ENCOUNTER — Telehealth: Payer: Self-pay

## 2019-01-21 ENCOUNTER — Encounter (HOSPITAL_COMMUNITY): Payer: Medicaid Other

## 2019-01-21 ENCOUNTER — Other Ambulatory Visit: Payer: Self-pay

## 2019-01-21 ENCOUNTER — Other Ambulatory Visit (HOSPITAL_COMMUNITY): Payer: Medicaid Other

## 2019-01-21 ENCOUNTER — Other Ambulatory Visit: Payer: Medicaid Other

## 2019-01-21 DIAGNOSIS — E876 Hypokalemia: Secondary | ICD-10-CM

## 2019-01-21 DIAGNOSIS — I5042 Chronic combined systolic (congestive) and diastolic (congestive) heart failure: Secondary | ICD-10-CM

## 2019-01-21 DIAGNOSIS — Z79899 Other long term (current) drug therapy: Secondary | ICD-10-CM

## 2019-01-21 DIAGNOSIS — I119 Hypertensive heart disease without heart failure: Secondary | ICD-10-CM

## 2019-01-21 DIAGNOSIS — N183 Chronic kidney disease, stage 3 unspecified: Secondary | ICD-10-CM

## 2019-01-21 DIAGNOSIS — I272 Pulmonary hypertension, unspecified: Secondary | ICD-10-CM

## 2019-01-21 DIAGNOSIS — I43 Cardiomyopathy in diseases classified elsewhere: Secondary | ICD-10-CM

## 2019-01-21 LAB — BASIC METABOLIC PANEL
BUN/Creatinine Ratio: 10 (ref 9–20)
BUN: 12 mg/dL (ref 6–24)
CO2: 24 mmol/L (ref 20–29)
Calcium: 8.9 mg/dL (ref 8.7–10.2)
Chloride: 100 mmol/L (ref 96–106)
Creatinine, Ser: 1.23 mg/dL (ref 0.76–1.27)
GFR calc Af Amer: 76 mL/min/{1.73_m2} (ref >59)
GFR calc non Af Amer: 66 mL/min/{1.73_m2} (ref >59)
Glucose: 73 mg/dL (ref 65–99)
Potassium: 3.5 mmol/L (ref 3.5–5.2)
Sodium: 136 mmol/L (ref 134–144)

## 2019-01-21 MED ORDER — POTASSIUM CHLORIDE CRYS ER 20 MEQ PO TBCR: 20.0000 meq | EXTENDED_RELEASE_TABLET | Freq: Every day | ORAL | 3 refills | Status: DC

## 2019-01-21 MED FILL — POTASSIUM CL ER 20 MEQ TAB: 20 | 30 days supply | Qty: 30 | Fill #0

## 2019-01-21 NOTE — Telephone Encounter (Signed)
-----   Message from Liliane Shi, [place] sent at 01/21/2019  8:02 AM EDT ----- Please call [name] His echo shows that his heart function (ejection fraction) is stable when compared to prior studies.  His lung pressures are more elevated (PASP - Pulmonary Artery Systolic Pressure is 79; it was 53 in 10/2018).  He has significant leakage of the mitral valve and moderate leakage of the tricuspid valve.  There is a nodule on the tricuspid valve which has been evaluated in the past.   I reviewed his echo with Dr. [name].  We would like him to be referred to the Heart Failure Clinic to investigate the elevated lung pressures further.   Recommendations:  - Continue current medications.  - Refer to the CHF Clinic (Dx - Pulmonary HTN, combined systolic and diastolic HF, mitral regurgitation) Richardson Dopp, PA-C    01/21/2019 7:56 AM

## 2019-01-21 NOTE — Telephone Encounter (Signed)
Pt advised to increase his K per his BMET results to 26meq a day.. new RX sent to [facility]..   Pt verbalized understanding and agreed to Referral to the CHF clinic.   Will be sure to continue all of his current meds.

## 2019-01-21 NOTE — Progress Notes (Signed)
Bmet 

## 2019-01-25 MED FILL — AMLODIPINE BESYLATE 10 MG T: 10 | 60 days supply | Qty: 60 | Fill #3

## 2019-01-26 ENCOUNTER — Telehealth: Payer: Self-pay | Admitting: *Deleted

## 2019-01-26 MED FILL — ISOSORBIDE MN ER 30 MG TAB: 30 | 30 days supply | Qty: 30 | Fill #2

## 2019-01-26 NOTE — Telephone Encounter (Signed)
PT VM FULL COULDN'T LEAVE MESSAGE   LVM ON DTR PHONE DUE TO VM FULL TO CONTACT FATHER TO [place]

## 2019-01-29 ENCOUNTER — Telehealth: Payer: Self-pay | Admitting: Physician Assistant

## 2019-01-29 NOTE — Telephone Encounter (Signed)
[email or web address] visit on 02/03/2019     Phone Call to obtain consent     -    01/29/2019          Virtual Visit Pre-Appointment Phone Call  "(Name), I am calling you today to discuss your upcoming appointment. We are currently trying to limit exposure to the virus that causes COVID-19 by seeing patients at home rather than in the office."  1. "What is the BEST phone number to call the day of the visit?" - include this in appointment notes  2. Do you have or have access to (through a family member/friend) a smartphone with video capability that we can use for your visit?" a. If yes - list this number in appt notes as cell (if different from BEST phone #) and list the appointment type as a VIDEO visit in appointment notes b. If no - list the appointment type as a PHONE visit in appointment notes  3. Confirm consent - "In the setting of the current Covid19 crisis, you are scheduled for a (phone or video) visit with your provider on (date) at (time).  Just as we do with many in-office visits, in order for you to participate in this visit, we must obtain consent.  If you'd like, I can send this to your mychart (if signed up) or email for you to review.  Otherwise, I can obtain your verbal consent now.  All virtual visits are billed to your insurance company just like a normal visit would be.  By agreeing to a virtual visit, we'd like you to understand that the technology does not allow for your provider to perform an examination, and thus may limit your provider's ability to fully assess your condition. If your provider identifies any concerns that need to be evaluated in person, we will make arrangements to do so.  Finally, though the technology is pretty good, we cannot assure that it will always work on either your or our end, and in the setting of a video visit, we may have to convert it to a phone-only visit.  In either situation, we cannot ensure that we have a secure connection.   Are you willing to proceed?" STAFF: Did the patient verbally acknowledge consent to telehealth visit? Document YES/NO here: YES  4. Advise patient to be prepared - "Two hours prior to your appointment, go ahead and check your blood pressure, pulse, oxygen saturation, and your weight (if you have the equipment to check those) and write them all down. When your visit starts, your provider will ask you for this information. If you have an Apple Watch or Kardia device, please plan to have heart rate information ready on the day of your appointment. Please have a pen and paper handy nearby the day of the visit as well."  5. Give patient instructions for MyChart download to smartphone OR Doximity/Doxy.me as below if video visit (depending on what platform provider is using)  6. Inform patient they will receive a phone call 15 minutes prior to their appointment time (may be from unknown caller ID) so they should be prepared to answer    TELEPHONE CALL NOTE  Daniel Joseph has been deemed a candidate for a follow-up tele-health visit to limit community exposure during the Covid-19 pandemic. I spoke with the patient via phone to ensure availability of phone/video source, confirm preferred email & phone number, and discuss instructions and expectations.  I reminded Daniel Joseph to be prepared with  any vital sign and/or heart rhythm information that could potentially be obtained via home monitoring, at the time of his visit. I reminded Daniel Joseph to expect a phone call prior to his visit.  [name] 01/29/2019 10:18 AM   INSTRUCTIONS FOR DOWNLOADING THE MYCHART APP TO SMARTPHONE  - The patient must first make sure to have activated MyChart and know their login information - If Apple, go to [employer] and type in MyChart in the search bar and download the app. If Android, ask patient to go to [employer] and type in [place] in the search bar and download the app. The app is free but as  with any other app downloads, their phone may require them to verify saved payment information or Apple/Android password.  - The patient will need to then log into the app with their MyChart username and password, and select [facility] as their healthcare provider to link the account. When it is time for your visit, go to the MyChart app, find appointments, and click Begin Video Visit. Be sure to Select Allow for your device to access the Microphone and Camera for your visit. You will then be connected, and your provider will be with you shortly.  **If they have any issues connecting, or need assistance please contact MyChart service desk [phone])**  **If using a computer, in order to ensure the best quality for their visit they will need to use either of the following Internet Browsers: [employer], or Google Chrome**  IF USING DOXIMITY or DOXY.ME - The patient will receive a link just prior to their visit by text.     FULL LENGTH CONSENT FOR TELE-HEALTH VISIT   I hereby voluntarily request, consent and authorize [facility] and its employed or contracted physicians, physician assistants, nurse practitioners or other licensed health care professionals (the Practitioner), to provide me with telemedicine health care services (the Services") as deemed necessary by the treating Practitioner. I acknowledge and consent to receive the Services by the Practitioner via telemedicine. I understand that the telemedicine visit will involve communicating with the Practitioner through live audiovisual communication technology and the disclosure of certain medical information by electronic transmission. I acknowledge that I have been given the opportunity to request an in-person assessment or other available alternative prior to the telemedicine visit and am voluntarily participating in the telemedicine visit.  I understand that I have the right to withhold or withdraw my consent to the  use of telemedicine in the course of my care at any time, without affecting my right to future care or treatment, and that the Practitioner or I may terminate the telemedicine visit at any time. I understand that I have the right to inspect all information obtained and/or recorded in the course of the telemedicine visit and may receive copies of available information for a reasonable fee.  I understand that some of the potential risks of receiving the Services via telemedicine include:   Delay or interruption in medical evaluation due to technological equipment failure or disruption;  Information transmitted may not be sufficient (e.g. poor resolution of images) to allow for appropriate medical decision making by the Practitioner; and/or   In rare instances, security protocols could fail, causing a breach of personal health information.  Furthermore, I acknowledge that it is my responsibility to provide information about my medical history, conditions and care that is complete and accurate to the best of my ability. I acknowledge that Practitioner's advice, recommendations, and/or decision  may be based on factors not within their control, such as incomplete or inaccurate data provided by me or distortions of diagnostic images or specimens that may result from electronic transmissions. I understand that the practice of medicine is not an exact science and that Practitioner makes no warranties or guarantees regarding treatment outcomes. I acknowledge that I will receive a copy of this consent concurrently upon execution via email to the email address I last provided but may also request a printed copy by calling the office of [facility].    I understand that my insurance will be billed for this visit.   I have read or had this consent read to me.  I understand the contents of this consent, which adequately explains the benefits and risks of the Services being provided via telemedicine.   I have  been provided ample opportunity to ask questions regarding this consent and the Services and have had my questions answered to my satisfaction.  I give my informed consent for the services to be provided through the use of telemedicine in my medical care  By participating in this telemedicine visit I agree to the above.

## 2019-02-02 NOTE — Progress Notes (Signed)
Virtual Visit via Telephone Note   This visit type was conducted due to national recommendations for restrictions regarding the COVID-19 Pandemic (e.g. social distancing) in an effort to limit this patient's exposure and mitigate transmission in our community.  Due to his co-morbid illnesses, this patient is at least at moderate risk for complications without adequate follow up.  This format is felt to be most appropriate for this patient at this time.  The patient did not have access to video technology/had technical difficulties with video requiring transitioning to audio format only (telephone).  All issues noted in this document were discussed and addressed.  No physical exam could be performed with this format.  Please refer to the patient's chart for his  consent to telehealth for [facility].   Date:  02/03/2019   ID:  Daniel Joseph, DOB 12-05-1960, MRN [id number]  Patient Location: Home Provider Location: Home  PCP:  Lanae Boast, Tony  Cardiologist:  Fransico Him, MD   Electrophysiologist:  None   Evaluation Performed:  Follow-Up Visit  Chief Complaint:  Follow up on CHF  History of Present Illness:    Daniel Joseph is a 58 y.o. 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 He was initially seen in 01/2014. Echocardiogram demonstrated EF 95-28, grade 2 diastolic dysfunction and an abnormality on the tricuspid valve concerning for mass or vegetation.  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 Repeat TEE in 08/2014 demonstrated stable tricuspid valve findings.  He saw Dr. Lovena Le in 2016 for evaluation of NSVT and EPS or ICD was not recommended at that time.  Echocardiogram in February 2020 demonstrated normal LV function, mod MR and moderate pulmonary hypertension.  I saw him in clinic in 10/2018 for follow up on CHF.  A follow up echo demonstrated EF 40-45,  PASP 79, probable severe MR and 1 cm x 1 cm TV mass.  A PYP scan was equivocal for TTR Amyloidosis.  He has been referred to the CHF clinic for further evaluation and probable R heart cath.    Today, he notes he is doing well.  He has some fatigue and shortness of breath when he first takes his medications in the AM.  This lasts about 30 minutes.  He does not have any shortness of breath or dyspnea on exertion the rest of the day.   He has no chest pain, syncope, dizziness, leg swelling.  He sleeps on an incline and has rare instances of paroxysmal nocturnal dyspnea.   The patient does not have symptoms concerning for COVID-19 infection (fever, chills, cough, or new shortness of breath).    Past Medical History:  Diagnosis Date   Benign essential HTN 01/19/2014   Renal Artery [place] 6/16:  No RAS, No AAA   Cardiomyopathy, dilated, nonischemic (HCC)    EF 45-50% bye echo 08/2015 and EF 50-55% by echo 10/2017   Chronic combined systolic and diastolic CHF (congestive heart failure) ([place]) 01/21/2014   Echocardiogram 5.2020    Echo 01/2019: EF 40-45, mild LVH, Gr 3 DD (restrictive filling), normal RVSF, MR may be severe, mod TR, 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, PASP 79   Gout    Mitral valve regurgitation    mild to moderate MR with MVP of ANMVL by echo 2019   PVC's (premature ventricular contractions)    nonsustained VT as well as PVCs - no ICD indicated due to EF 40-45%  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   Past Surgical History:  Procedure Laterality Date   ABDOMINAL SURGERY  at birth   TEE WITHOUT CARDIOVERSION N/A 01/25/2014   Procedure: TRANSESOPHAGEAL ECHOCARDIOGRAM (TEE);  Surgeon: [name], MD;  Location: [facility];  Service: Cardiovascular;  Laterality: N/A;   TEE WITHOUT CARDIOVERSION N/A 09/14/2014   Procedure: TRANSESOPHAGEAL ECHOCARDIOGRAM (TEE);  Surgeon: Sueanne Margarita, MD;  Location[facility] ENDOSCOPY;  Service: Cardiovascular;   Laterality: N/A;     Current Meds  Medication Sig   allopurinol (ZYLOPRIM) 100 MG tablet Take 1 tablet (100 mg total) by mouth 2 (two) times daily.   amLODipine (NORVASC) 10 MG tablet Take 1 tablet (10 mg total) by mouth daily.   aspirin EC 81 MG tablet Take 1 tablet (81 mg total) by mouth daily.   benazepril (LOTENSIN) 40 MG tablet Take 1 tablet (40 mg total) by mouth daily.   cetirizine (ZYRTEC) 10 MG tablet Take 1 tablet (10 mg total) by mouth daily.   cloNIDine (CATAPRES) 0.2 MG tablet Take 1 tablet (0.2 mg total) by mouth daily.   doxazosin (CARDURA) 4 MG tablet Take 1.5 tablets (6 mg total) by mouth at bedtime.   furosemide (LASIX) 40 MG tablet Take 1 tablet (40 mg total) by mouth daily.   gabapentin (NEURONTIN) 300 MG capsule Take 1 capsule (300 mg total) by mouth 3 (three) times daily.   hydrALAZINE (APRESOLINE) 50 MG tablet TAKE 2 TABLETS BY MOUTH 3 TIMES DAILY   isosorbide mononitrate (IMDUR) 30 MG 24 hr tablet Take 1 tablet (30 mg total) by mouth daily.   labetalol (NORMODYNE) 200 MG tablet Take 1 tablet (200 mg total) by mouth 2 (two) times daily.   potassium chloride SA (K-DUR) 20 MEQ tablet Take 1 tablet (20 mEq total) by mouth daily.   spironolactone (ALDACTONE) 50 MG tablet Take 1 tablet (50 mg total) by mouth daily.   tizanidine (ZANAFLEX) 2 MG capsule Take 1 capsule (2 mg total) by mouth 3 (three) times daily.   traMADol (ULTRAM) 50 MG tablet Take 1 tablet (50 mg total) by mouth every 8 (eight) hours as needed for severe pain.   triamcinolone cream (KENALOG) 0.1 % Apply 1 application topically 2 (two) times daily.   Current Facility-Administered Medications for the 02/03/19 encounter (Telemedicine) with Richardson Dopp T, PA-C  Medication   0.9 %  sodium chloride infusion     Allergies:   Patient has no known allergies.   Social History   Tobacco Use   Smoking status: Never Smoker   Smokeless tobacco: Never Used  Substance Use Topics   Alcohol  use: No   Drug use: No     Family Hx: The patient's family history includes Heart Problems in his brother, father, and mother; Heart attack in his brother and father; Heart failure in his mother; Hypertension in his brother, father, and mother; Stroke in his mother. There is no history of Colon cancer, Esophageal cancer, or Stomach cancer.  ROS:   Please see the history of present illness.     All other systems reviewed and are negative.   Prior CV studies:   The following studies were reviewed today:   Tc40mPyrophosphate Scan 01/20/2019 This study is equivocal for TTR amyloidosis, consider cardiac MRI for further evaluation.   Echo 01/20/2019 EF 40-45, mild LVH, Gr 3 DD, normal RVSF, poss severe MR, mod TR, mobile 1 cm x 1 cm TV nodule, PASP 79  Echocardiogram 10/23/2018  EF 50-55, mild LVH, severe LAE, mild RAE, mild mitral valve prolapse, mild MAC, moderate MR, mobile density on tricuspid valve (possible fibroblastoma), moderate pulmonary hypertension   Echocardiogram 11/03/2017 EF 50-55, possible inferior and inferoseptal HK, mild late anterior leaflet systolic prolapse, mild to moderate MR, severe LAE, moderate RAE  Renal artery ultrasound 02/21/2015 Normal caliber abdominal aorta, normal renal arteries bilaterally  Myoview 01/17/2015 Conclusion:  There is a small, moderately severe defect in the inferoapical region at stress and rest. 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 The SDS = 3. There is no reversible ischemia. There is moderate global hypokinesis of the LV  TEE 09/14/2014 EF 40-45, normal wall motion, mild to moderate MR, smooth spherical mass on the atrial side of tricuspid valve leaflet attached broadbase most consistent with myxoma or fibroblastoma, mild TR  Renal Artery [place] 6/15 Normal abdominal aorta. Normal renal arteries bilaterally.  Labs/Other Tests and Data Reviewed:    EKG:  No ECG reviewed.  Recent Labs: 11/11/2018: NT-Pro BNP 564 01/20/2019: BUN 12;  Creatinine, Ser 1.23; Potassium 3.5; Sodium 136   Recent Lipid Panel Lab Results  Component Value Date/Time   CHOL 145 09/02/2018 09:50 AM   TRIG 82 09/02/2018 09:50 AM   HDL 54 09/02/2018 09:50 AM   CHOLHDL 2.7 09/02/2018 09:50 AM   CHOLHDL 3.5 08/13/2017 08:48 AM   LDLCALC 75 09/02/2018 09:50 AM   LDLCALC 94 08/13/2017 08:48 AM      Wt Readings from Last 3 Encounters:  02/03/19 225 lb (102.1 kg)  12/02/18 226 lb (102.5 kg)  11/11/18 226 lb (102.5 kg)     Objective:    Vital Signs:  Ht _0  (1.854 m)    Wt 225 lb (102.1 kg)    BMI 29.69 kg/m    VITAL SIGNS:  reviewed GEN:  no acute distress RESPIRATORY:  no labored breathing NEURO:  alert and oriented PSYCH:  he seems to be in good spirits  ASSESSMENT & PLAN:    Chronic combined systolic and diastolic CHF (congestive heart failure) ([place]) EF by echo 01/20/19 is 40-45 with severe diastolic dysfunction.  Overall, his volume status seems to be stable.  Continue beta-blocker, hydralazine, nitrates, ACE inhibitor, spironolactone.  Pulmonary hypertension, unspecified ([place]) PASP 79 by recent echocardiogram.  Currently, he does not have significant symptoms.  He has an evaluation with the heart failure clinic June 26.  I reviewed his case today with Dr. [name].  Given his equivocal PYP scan, we will go ahead and obtain a cardiac MRI with and without contrast prior to his visit in June with the congestive heart failure clinic.  Nonrheumatic mitral valve regurgitation Echo in 01/2019 demonstrated possible severe mitral regurgitation.  As noted, he has an evaluation with the heart failure clinic next month.  He may need a transesophageal echocardiogram in addition to his right heart catheterization for pulmonary hypertension.  As noted, he is currently without significant symptoms.  Tricuspid valve mass This is been documented in the past and appears to be stable.  CKD (chronic kidney disease), stage III (HCC) Recent creatinine  1.23.  He should be able to tolerate contrast for cardiac MRI.  Nonsustained paroxysmal ventricular tachycardia ([place]) Evaluated by EP in the past.  Continue beta-blocker.  COVID-19 Education: The signs and symptoms of COVID-19 were discussed with the patient and how to seek care for testing (follow up with PCP or arrange E-visit).  The importance of social distancing was discussed today.  Time:   Today, I have  spent 17 minutes with the patient with telehealth technology discussing the above problems.     Medication Adjustments/Labs and Tests Ordered: Current medicines are reviewed at length with the patient today.  Concerns regarding medicines are outlined above.   Tests Ordered: Orders Placed This Encounter  Procedures   MR Card Morphology Wo/W Cm    Medication Changes: No orders of the defined types were placed in this encounter.   Disposition:  Follow up in 3 month(s)  Signed, Richardson Dopp, PA-C  02/03/2019 9:53 AM    [place]

## 2019-02-03 ENCOUNTER — Other Ambulatory Visit: Payer: Self-pay

## 2019-02-03 ENCOUNTER — Encounter: Payer: Self-pay | Admitting: Physician Assistant

## 2019-02-03 ENCOUNTER — Telehealth (INDEPENDENT_AMBULATORY_CARE_PROVIDER_SITE_OTHER): Payer: Medicaid Other | Admitting: Physician Assistant

## 2019-02-03 VITALS — Ht 73.0 in | Wt 225.0 lb

## 2019-02-03 DIAGNOSIS — I078 Other rheumatic tricuspid valve diseases: Secondary | ICD-10-CM

## 2019-02-03 DIAGNOSIS — N183 Chronic kidney disease, stage 3 unspecified: Secondary | ICD-10-CM

## 2019-02-03 DIAGNOSIS — I340 Nonrheumatic mitral (valve) insufficiency: Secondary | ICD-10-CM

## 2019-02-03 DIAGNOSIS — Z7189 Other specified counseling: Secondary | ICD-10-CM

## 2019-02-03 DIAGNOSIS — I272 Pulmonary hypertension, unspecified: Secondary | ICD-10-CM

## 2019-02-03 DIAGNOSIS — I5042 Chronic combined systolic (congestive) and diastolic (congestive) heart failure: Secondary | ICD-10-CM

## 2019-02-03 DIAGNOSIS — I472 Ventricular tachycardia: Secondary | ICD-10-CM

## 2019-02-03 DIAGNOSIS — I4729 Other ventricular tachycardia: Secondary | ICD-10-CM

## 2019-02-03 NOTE — Patient Instructions (Signed)
Medication Instructions:  Continue current medications.  If you need a refill on your cardiac medications before your next appointment, please call your pharmacy.   Lab work: None  If you have labs (blood work) drawn today and your tests are completely normal, you will receive your results only by: Marland Kitchen MyChart Message (if you have MyChart) OR . A paper copy in the mail If you have any lab test that is abnormal or we need to change your treatment, we will call you to review the results.  Testing/Procedures: We will arrange a cardiac MRI with and without contrast prior to your visit with the heart failure clinic in June  Follow-Up: At [facility], you and your health needs are our priority.  As part of our continuing mission to provide you with exceptional heart care, we have created designated Provider Care Teams.  These Care Teams include your primary Cardiologist (physician) and Advanced Practice Providers (APPs -  Physician Assistants and Nurse Practitioners) who all work together to provide you with the care you need, when you need it. You will need a follow up appointment in:  3 months.  Please call our office 2 months in advance to schedule this appointment.  You may see Fransico Him, MD or Richardson Dopp, PA-C   Any Other Special Instructions Will Be Listed Below (If Applicable).  If you cannot obtain a blood pressure cuff, let me know and we can one delivered to you.

## 2019-02-05 DIAGNOSIS — Z20828 Contact with and (suspected) exposure to other viral communicable diseases: Secondary | ICD-10-CM | POA: Diagnosis not present

## 2019-02-10 ENCOUNTER — Telehealth: Payer: Self-pay | Admitting: Licensed Clinical Social Worker

## 2019-02-10 ENCOUNTER — Telehealth: Payer: Self-pay | Admitting: *Deleted

## 2019-02-10 NOTE — Telephone Encounter (Signed)
SPOKE WITH PT WHO CALLED BACK WITH TWO BLOOD PRESSURE READINGS BOTH TOOKEN IN THE EARLY AM AROUND 8 OCLK HOUR  FROM   5-26  166/114 5-27   157/96   BOTH TIME PT HAD TAKEN MEDICATIONS HOUR BEFORE. PT REQUESTING A BP MACHINE DUE  TO THE FACT OF HAVING TO USE A BORROWED ONE.  PT TRYING TO KEEP A LOG OF BLOOD PRESSURE AND NOTIFY PROVIDER AS REQUESTED.Marland Kitchen  BOTH WEAVER  NOTIFIED AND SOCIAL WORKER BRENNAN MESSAGED FOR REQUEST FOR A BP MACHINE WITH PT CHART ATTACHED.  PT SCHEDULED FOR MRI 6-16 FOLLOW UP 8-20 WITH WEAVER

## 2019-02-10 NOTE — Telephone Encounter (Signed)
CSW referred to assist patient with obtaining a BP cuff. CSW left message to inform patient that cuff will be delivered to home by the end of the week. CSW available as needed. Raquel Sarna, [place], [place]

## 2019-02-10 NOTE — Telephone Encounter (Signed)
Increase Imdur to 60 mg once daily. Richardson Dopp, PA-C    02/10/2019 5:29 PM

## 2019-02-17 ENCOUNTER — Telehealth: Payer: Self-pay | Admitting: *Deleted

## 2019-02-17 NOTE — Telephone Encounter (Signed)
    COVID-19 Pre-Screening Questions:  . In the past 7 to 10 days have you had a cough,  shortness of breath, headache, congestion, fever (100 or greater) body aches, chills, sore throat, or sudden loss of taste or sense of smell? . Have you been around anyone with known Covid 19. . Have you been around anyone who is awaiting Covid 19 test results in the past 7 to 10 days? . Have you been around anyone who has been exposed to Covid 19, or has mentioned symptoms of Covid 19 within the past 7 to 10 days?  If you have any concerns/questions about symptoms patients report during screening (either on the phone or at threshold). Contact the provider seeing the patient or DOD for further guidance.  If neither are available contact a member of the leadership team.           Contacted patient via phone call. No to all Covid 19 questions. Has a mask. KB  

## 2019-02-18 ENCOUNTER — Telehealth: Payer: Self-pay

## 2019-02-18 ENCOUNTER — Other Ambulatory Visit: Payer: Medicaid Other

## 2019-02-18 NOTE — Telephone Encounter (Signed)
Daniel Joseph, This pt is scheduled for his colon on 03/04/2019. He has a complicated medical history with CHF ,neck nodule,PVC's, cardiomyopathy and other heart problems. The last echo they recommended the pt be referred to a CHF clinic. Does the pt need an OV first, hospital appointment or cardiac clearance?  Please advise. Thanks, Gwyndolyn Saxon in [facility]

## 2019-02-19 NOTE — Telephone Encounter (Signed)
Noted  

## 2019-02-19 NOTE — Telephone Encounter (Signed)
Janis,  This pt is cleared for anesthetic care at [facility].  Thanks,  Pam Vanalstine 

## 2019-02-19 NOTE — Telephone Encounter (Signed)
Excellent.  Thanks for following up, John.  Gwyndolyn Saxon, thanks for checking with [place].  - HD

## 2019-02-22 ENCOUNTER — Other Ambulatory Visit: Payer: Medicaid Other

## 2019-02-23 ENCOUNTER — Other Ambulatory Visit: Payer: Self-pay

## 2019-02-23 ENCOUNTER — Telehealth: Payer: Self-pay

## 2019-02-23 ENCOUNTER — Ambulatory Visit: Payer: Medicaid Other

## 2019-02-23 DIAGNOSIS — Z1211 Encounter for screening for malignant neoplasm of colon: Secondary | ICD-10-CM

## 2019-02-23 NOTE — Progress Notes (Signed)
No egg or soy allergy known to patient  No issues with past sedation with any surgeries  or procedures, no intubation problems  No diet pills per patient No home 02 use per patient  No blood thinners per patient  Pt denies issues with constipation  No A fib or A flutter  EMMI video sent to pt's e mail  

## 2019-02-23 NOTE — Telephone Encounter (Signed)
Pt was called multiple , did not answer and cannot leave voicemail. Cancel procedure appointment and sent a letter.

## 2019-02-24 ENCOUNTER — Telehealth: Payer: Self-pay | Admitting: *Deleted

## 2019-02-24 NOTE — Telephone Encounter (Signed)
Attempted to contact pt  vm unable to leave messages at this time.

## 2019-03-02 ENCOUNTER — Ambulatory Visit (HOSPITAL_COMMUNITY): Admission: RE | Admit: 2019-03-02 | Payer: Medicaid Other | Source: Ambulatory Visit

## 2019-03-03 ENCOUNTER — Telehealth: Payer: Self-pay

## 2019-03-03 NOTE — Telephone Encounter (Signed)
Called to do COVID -19 Screening. Patient preferred to have visit via telephone. Thanks!

## 2019-03-04 ENCOUNTER — Other Ambulatory Visit: Payer: Self-pay

## 2019-03-04 ENCOUNTER — Encounter: Payer: Medicaid Other | Admitting: Gastroenterology

## 2019-03-04 ENCOUNTER — Ambulatory Visit (INDEPENDENT_AMBULATORY_CARE_PROVIDER_SITE_OTHER): Payer: Medicaid Other | Admitting: Family Medicine

## 2019-03-04 DIAGNOSIS — M109 Gout, unspecified: Secondary | ICD-10-CM | POA: Diagnosis not present

## 2019-03-04 DIAGNOSIS — R110 Nausea: Secondary | ICD-10-CM | POA: Diagnosis not present

## 2019-03-04 DIAGNOSIS — I10 Essential (primary) hypertension: Secondary | ICD-10-CM

## 2019-03-04 MED ORDER — AMLODIPINE BESYLATE 10 MG PO TABS: 10.0000 mg | ORAL_TABLET | Freq: Every day | ORAL | 4 refills | Status: DC

## 2019-03-04 MED ORDER — ONDANSETRON 8 MG PO TBDP: 8.0000 mg | ORAL_TABLET | Freq: Three times a day (TID) | ORAL | 0 refills | Status: DC | PRN

## 2019-03-04 MED ORDER — ALLOPURINOL 100 MG PO TABS: 100.0000 mg | ORAL_TABLET | Freq: Two times a day (BID) | ORAL | 5 refills | Status: DC

## 2019-03-04 MED FILL — ONDANSETRON ODT 8 MG TABLET: 8 | 6 days supply | Qty: 20 | Fill #0

## 2019-03-04 MED FILL — ALLOPURINOL 100 MG TABLET: 100 | 90 days supply | Qty: 180 | Fill #0

## 2019-03-04 NOTE — Progress Notes (Signed)
  Patient [facility]  Virtual Visit via Telephone Note  I connected with Daniel Joseph on 03/04/19 at 10:00 AM EDT by telephone and verified that I am speaking with the correct person using two identifiers.   I discussed the limitations, risks, security and privacy concerns of performing an evaluation and management service by telephone and the availability of in person appointments. I also discussed with the patient that there may be a patient responsible charge related to this service. The patient expressed understanding and agreed to proceed.   History of Present Illness: Daniel Joseph  has a past medical history of Benign essential HTN (01/19/2014), Cardiomyopathy, dilated, nonischemic ([place]), Chronic combined systolic and diastolic CHF (congestive heart failure) ([place]) (01/21/2014), Echocardiogram 5.2020, Gout, Mitral valve regurgitation, PVC's (premature ventricular contractions), and Tricuspid valve mass. Patient reports compliance with all medications.  Patient denies side effects of medications.  He does not monitor BP at home. Denies regular physical activity and low sodium diet.  He is doing well without complaints. He needs refills on medications.    Observations/Objective: Patient with regular voice tone, rate and rhythm. Speaking calmly and is in no apparent distress.    Assessment and Plan: 1. Arthritis, gouty - allopurinol (ZYLOPRIM) 100 MG tablet; Take 1 tablet (100 mg total) by mouth 2 (two) times daily.  Dispense: 60 tablet; Refill: 5  2. Essential hypertension - amLODipine (NORVASC) 10 MG tablet; Take 1 tablet (10 mg total) by mouth daily.  Dispense: 30 tablet; Refill: 4  3. Nausea - ondansetron (ZOFRAN ODT) 8 MG disintegrating tablet; Take 1 tablet (8 mg total) by mouth every 8 (eight) hours as needed for nausea or vomiting.  Dispense: 20 tablet; Refill: 0     Follow Up Instructions:  We discussed hand washing, using hand  sanitizer when soap and water are not available, only going out when absolutely necessary, and social distancing. Explained to patient that he is immunocompromised and will need to take precautions during this time.   I discussed the assessment and treatment plan with the patient. The patient was provided an opportunity to ask questions and all were answered. The patient agreed with the plan and demonstrated an understanding of the instructions.   The patient was advised to call back or seek an in-person evaluation if the symptoms worsen or if the condition fails to improve as anticipated.  I provided 7 minutes of non-face-to-face time during this encounter.  Ms. Andr L. Nathaneil Canary, FNP-BC Patient [facility] [address] [place], [place] [id number] [phone]

## 2019-03-12 ENCOUNTER — Other Ambulatory Visit (HOSPITAL_COMMUNITY): Payer: Self-pay

## 2019-03-12 ENCOUNTER — Encounter (HOSPITAL_COMMUNITY): Payer: Self-pay | Admitting: Cardiology

## 2019-03-12 ENCOUNTER — Other Ambulatory Visit: Payer: Self-pay

## 2019-03-12 ENCOUNTER — Ambulatory Visit (HOSPITAL_COMMUNITY)
Admission: RE | Admit: 2019-03-12 | Discharge: 2019-03-12 | Disposition: A | Discharge status: Home / Self Care | Payer: Medicaid Other | Source: Ambulatory Visit | Attending: Cardiology | Admitting: Cardiology

## 2019-03-12 VITALS — BP 172/116 | HR 93 | Wt 223.8 lb

## 2019-03-12 DIAGNOSIS — Z7901 Long term (current) use of anticoagulants: Secondary | ICD-10-CM | POA: Insufficient documentation

## 2019-03-12 DIAGNOSIS — Z79899 Other long term (current) drug therapy: Secondary | ICD-10-CM | POA: Insufficient documentation

## 2019-03-12 DIAGNOSIS — I272 Pulmonary hypertension, unspecified: Secondary | ICD-10-CM

## 2019-03-12 DIAGNOSIS — Z8249 Family history of ischemic heart disease and other diseases of the circulatory system: Secondary | ICD-10-CM | POA: Diagnosis not present

## 2019-03-12 DIAGNOSIS — Z1159 Encounter for screening for other viral diseases: Secondary | ICD-10-CM | POA: Insufficient documentation

## 2019-03-12 DIAGNOSIS — I078 Other rheumatic tricuspid valve diseases: Secondary | ICD-10-CM | POA: Diagnosis not present

## 2019-03-12 DIAGNOSIS — I340 Nonrheumatic mitral (valve) insufficiency: Secondary | ICD-10-CM

## 2019-03-12 DIAGNOSIS — M109 Gout, unspecified: Secondary | ICD-10-CM | POA: Diagnosis not present

## 2019-03-12 DIAGNOSIS — N183 Chronic kidney disease, stage 3 (moderate): Secondary | ICD-10-CM | POA: Insufficient documentation

## 2019-03-12 DIAGNOSIS — I130 Hypertensive heart and chronic kidney disease with heart failure and stage 1 through stage 4 chronic kidney disease, or unspecified chronic kidney disease: Secondary | ICD-10-CM | POA: Diagnosis not present

## 2019-03-12 DIAGNOSIS — I43 Cardiomyopathy in diseases classified elsewhere: Secondary | ICD-10-CM | POA: Insufficient documentation

## 2019-03-12 DIAGNOSIS — Z7982 Long term (current) use of aspirin: Secondary | ICD-10-CM | POA: Insufficient documentation

## 2019-03-12 DIAGNOSIS — I5042 Chronic combined systolic (congestive) and diastolic (congestive) heart failure: Secondary | ICD-10-CM | POA: Diagnosis not present

## 2019-03-12 LAB — BASIC METABOLIC PANEL
Anion gap: 10 (ref 5–15)
BUN: 13 mg/dL (ref 6–20)
CO2: 22 mmol/L (ref 22–32)
Calcium: 9.3 mg/dL (ref 8.9–10.3)
Chloride: 109 mmol/L (ref 98–111)
Creatinine, Ser: 1.36 mg/dL — ABNORMAL HIGH (ref 0.61–1.24)
GFR calc Af Amer: >60 mL/min (ref >60)
GFR calc non Af Amer: 58 mL/min — ABNORMAL LOW (ref >60)
Glucose, Bld: 78 mg/dL (ref 70–99)
Potassium: 4.2 mmol/L (ref 3.5–5.1)
Sodium: 141 mmol/L (ref 135–145)

## 2019-03-12 LAB — CBC
HCT: 45.5 % (ref 39.0–52.0)
Hemoglobin: 15.3 g/dL (ref 13.0–17.0)
MCH: 31.7 pg (ref 26.0–34.0)
MCHC: 33.6 g/dL (ref 30.0–36.0)
MCV: 94.2 fL (ref 80.0–100.0)
Platelets: 156 10*3/uL (ref 150–400)
RBC: 4.83 MIL/uL (ref 4.22–5.81)
RDW: 12.3 % (ref 11.5–15.5)
WBC: 3.2 10*3/uL — ABNORMAL LOW (ref 4.0–10.5)
nRBC: 0 % (ref 0.0–0.2)

## 2019-03-12 LAB — SARS CORONAVIRUS 2 (TAT 6-24 HRS): SARS Coronavirus 2: NEGATIVE

## 2019-03-12 MED ORDER — CARVEDILOL 12.5 MG PO TABS: 12.5000 mg | ORAL_TABLET | Freq: Two times a day (BID) | ORAL | 5 refills | Status: DC

## 2019-03-12 MED ORDER — SODIUM CHLORIDE 0.9% FLUSH: 3.0000 mL | Freq: Two times a day (BID) | INTRAVENOUS | Status: DC

## 2019-03-12 MED FILL — CARVEDILOL 12.5 MG TABLET: 12.5 | 30 days supply | Qty: 60 | Fill #0

## 2019-03-12 NOTE — Patient Instructions (Addendum)
STOP Labetolol  START Coreg 12.5mg  1 tab twice a day  Labs today We will only contact you if something comes back abnormal or we need to make some changes. Otherwise no news is good news!  Call Dr Isidore Moos office to inquire about your MRI.   Your physician has requested that you have a TEE. During a TEE, sound waves are used to create images of your heart. It provides your doctor with information about the size and shape of your heart and how well your heart's chambers and valves are working. In this test, a transducer is attached to the end of a flexible tube that's guided down your throat and into your esophagus (the tube leading from you mouth to your stomach) to get a more detailed image of your heart. You are not awake for the procedure. Please see the instruction sheet given to you today. For further information please visit [email or web address].   [facility] [place] [id number] [place] [address] Dept: [phone] Loc: [phone]  Daniel Joseph  03/12/2019  You are scheduled for a TEE Transesophageal Echocardiogram and  Cardiac Catheterization on Tuesday, June 30 with Dr. Loralie Champagne.  1. Please arrive at the [facility] (Main Entrance A) at [facility]: [address] [place], [place] [address] at 8:30 AM (This time is two hours before your procedure to ensure your preparation). Free valet parking service is available.   Special note: Every effort is made to have your procedure done on time. Please understand that emergencies sometimes delay scheduled procedures.  2. Diet: Do not eat solid foods after midnight.  The patient may have clear liquids until 5am upon the day of the procedure.  3. Labs: done today  4. Medication instructions in preparation for your procedure:   Contrast Allergy: No   Do no take Lasix and Spironolactone the morning of your procedure   On the  morning of your procedure, take your Aspirin and any morning medicines NOT listed above.  You may use sips of water.  5. Plan for one night stay--bring personal belongings. 6. Bring a current list of your medications and current insurance cards. 7. You MUST have a responsible person to drive you home. 8. Someone MUST be with you the first 24 hours after you arrive home or your discharge will be delayed. 9. Please wear clothes that are easy to get on and off and wear slip-on shoes.  Thank you for allowing [place] to care for you!   -- [facility]  Your physician recommends that you schedule a follow-up appointment in: 2 weeks after your Cath  At the [facility], you and your health needs are our priority. As part of our continuing mission to provide you with exceptional heart care, we have created designated Provider Care Teams. These Care Teams include your primary Cardiologist (physician) and Advanced Practice Providers (APPs- Physician Assistants and Nurse Practitioners) who all work together to provide you with the care you need, when you need it.   You may see any of the following providers on your designated Care Team at your next follow up: Marland Kitchen Dr Glori Bickers . Dr Loralie Champagne . Darrick Grinder, NP   '

## 2019-03-14 NOTE — Progress Notes (Signed)
PCP: Lanae Boast, [place] Cardiology: Dr. [name] HF Cardiology: Dr. [name]  58 y.o. 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 [name] for evaluation of CHF. 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  Cardiolite in 5/16 showed no ischemia but possible apical infarction.  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  PYP scan was read as equivocal in 5/20 but probably not suggestive of transthyretin amyloidosis.   Patient's BP remains poorly controlled despite multiple medications. It is very high here, but he says that at home it tends to range SBP 130s-150s. Renal artery dopplers were unremarkable in 6/16 and he reports a negative sleep study in the past.   Patient is on disability.  He is a nonsmoker.  He is short of breath after walking about 200 yards, has been like this for several years now.  Mild dyspnea walking up stairs.  No orthopnea/PND.  No chest pain. Weight has been stable, he takes Lasix 40 mg daily.  Strong family history of CAD.   ECG (personally reviewed): NSR, LVH, inferior Qs  Labs (5/20): K 3.5, creatinine 1.23  PMH: 1. Gout 2. H/o NSVT/PVCs 3. 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  Seen on multiple echoes, first in 2015.  4. HTN: Renal artery dopplers in 6/16 were unremarkable. Sleep study about 2 years ago was negative per patient's report.  5. Mitral regurgitation: TEE in 2015 with mild to moderate MR.  - Echo (2/20): Moderate MR.  - Echo (5/20): Possible severe MR 6. 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  - Cardiolite (5/16): Possible apical infarct, no ischemia.  - Echo (7/19): EF 50-55%.  - Echo (2/20): EF 50-55%, moderate MR, TV fibroelastoma.  - PYP scan  (5/20): Grade 1 visually, H/CL 1.23.  Read as equivocal but likely negative for TTR amyloidosis.  - Echo (5/20): EF 40-45%, mildly dilated LV with mild LVH, normal RV size and systolic function, possible severe MR, moderate TR, 1 cm nodule on TV may be fibroelastoma, PASP 79 mmHg.   FH:  Father with MI at 15, brother with MI at 33, mother with CHF, MI.  HTN in multiple family members.   Social History   Socioeconomic History  . Marital status: Single    Spouse name: Not on file  . Number of children: Not on file  . Years of education: Not on file  . Highest education level: Not on file  Occupational History  . Not on file  Social Needs  . Financial resource strain: Not on file  . Food insecurity    Worry: Not on file    Inability: Not on file  . Transportation needs    Medical: Not on file    Non-medical: Not on file  Tobacco Use  . Smoking status: Never Smoker  . Smokeless tobacco: Never Used  Substance and Sexual Activity  . Alcohol use: No  . Drug use: No  . Sexual activity: Not on file  Lifestyle  . Physical activity    Days per week: Not on file    Minutes per session: Not on file  . Stress: Not on file  Relationships  . Social Herbalist on phone: Not on file    Gets together: Not on file    Attends religious  service: Not on file    Active member of club or organization: Not on file    Attends meetings of clubs or organizations: Not on file    Relationship status: Not on file  . Intimate partner violence    Fear of current or ex partner: Not on file    Emotionally abused: Not on file    Physically abused: Not on file    Forced sexual activity: Not on file  Other Topics Concern  . Not on file  Social History Narrative  . Not on file   ROS: All systems reviewed and negative except as per HPI.   Current Outpatient Medications  Medication Sig Dispense Refill  . allopurinol (ZYLOPRIM) 100 MG tablet Take 1 tablet (100 mg total) by mouth 2 (two) times  daily. 60 tablet 5  . amLODipine (NORVASC) 10 MG tablet Take 1 tablet (10 mg total) by mouth daily. 30 tablet 4  . aspirin EC 81 MG tablet Take 1 tablet (81 mg total) by mouth daily. 90 tablet 3  . benazepril (LOTENSIN) 40 MG tablet Take 1 tablet (40 mg total) by mouth daily. 30 tablet 6  . cetirizine (ZYRTEC) 10 MG tablet Take 1 tablet (10 mg total) by mouth daily. 30 tablet 11  . cloNIDine (CATAPRES) 0.2 MG tablet Take 1 tablet (0.2 mg total) by mouth daily. 90 tablet 3  . doxazosin (CARDURA) 4 MG tablet Take 1.5 tablets (6 mg total) by mouth at bedtime. 45 tablet 2  . furosemide (LASIX) 40 MG tablet Take 1 tablet (40 mg total) by mouth daily. 135 tablet 3  . gabapentin (NEURONTIN) 300 MG capsule Take 1 capsule (300 mg total) by mouth 3 (three) times daily. 90 capsule 3  . hydrALAZINE (APRESOLINE) 50 MG tablet TAKE 2 TABLETS BY MOUTH 3 TIMES DAILY 180 tablet 2  . isosorbide mononitrate (IMDUR) 30 MG 24 hr tablet Take 1 tablet (30 mg total) by mouth daily. 30 tablet 2  . ondansetron (ZOFRAN ODT) 8 MG disintegrating tablet Take 1 tablet (8 mg total) by mouth every 8 (eight) hours as needed for nausea or vomiting. 20 tablet 0  . potassium chloride SA (K-DUR) 20 MEQ tablet Take 1 tablet (20 mEq total) by mouth daily. 90 tablet 3  . spironolactone (ALDACTONE) 50 MG tablet Take 1 tablet (50 mg total) by mouth daily. 30 tablet 11  . tizanidine (ZANAFLEX) 2 MG capsule Take 1 capsule (2 mg total) by mouth 3 (three) times daily. 20 capsule 0  . traMADol (ULTRAM) 50 MG tablet Take 1 tablet (50 mg total) by mouth every 8 (eight) hours as needed for severe pain. 30 tablet 0  . triamcinolone cream (KENALOG) 0.1 % Apply 1 application topically 2 (two) times daily. 30 g 0  . carvedilol (COREG) 12.5 MG tablet Take 1 tablet (12.5 mg total) by mouth 2 (two) times daily. 60 tablet 5   Current Facility-Administered Medications  Medication Dose Route Frequency Provider Last Rate Last Dose  . 0.9 %  sodium chloride  infusion  500 mL Intravenous Once Nelida Meuse III, MD       BP (!) 172/116   Pulse 93   Wt 101.5 kg (223 lb 12.8 oz)   SpO2 98%   BMI 29.53 kg/m  General: NAD Neck: No JVD, no thyromegaly or thyroid nodule.  Lungs: Clear to auscultation bilaterally with normal respiratory effort. CV: Nondisplaced PMI.  Heart regular S1/S2, +S4, 3/6 HSM apex.  No peripheral edema.  No carotid  bruit.  Normal pedal pulses.  Abdomen: Soft, nontender, no hepatosplenomegaly, no distention.  Skin: Intact without lesions or rashes.  Neurologic: Alert and oriented x 3.  Psych: Normal affect. Extremities: No clubbing or cyanosis.  HEENT: Normal.   Assessment/Plan: 1. Chronic primarily systolic CHF: Echo in 0/10 was reviewed, EF 40-45% with normal RV, possible severe mitral regurgitation, and severe pulmonary hypertension.  PYP scan in 5/20 was read as equivocal, but I suspect this is not suggestive of transthyretin amyloidosis. Cause of cardiomyopathy is uncertain.  On exam today, he is not volume overloaded.  Symptoms are chronically NYHA class II-III.  I think we need to work out two issues to begin which => how severe is his mitral regurgitation and does he have coronary disease.   - I will arrange for TEE to assess mitral regurgitation.  He needs to take all his morning BP meds prior so we can see severity of MR when BP is as well-controlled as we can get it. We discussed risk/benefits and he agrees to procedure.  - He will need right/left heart catheterization.  We discussed risks/benefits and he agrees to procedure.  - Current Lasix dose appears adequate.  - Continue benazepril 40 mg daily, hydralazine/Imdur, and spironolactone 50 mg daily.  BMET today.  - Would stop labetalol and start Coreg 12.5 mg bid.   - He is pending a cardiac MRI.  As above, I think that he probably does not have transthyretin amyloidosis, but think cMRI helpful to look for other infiltrative disease.  Need to send myeloma panel and  urine immunofixation.  2. Mitral regurgitation: Prominent murmur, ?severe on last TTE.  This may explain elevated PA pressure on echo.   - As above, plan for TEE (make sure he takes morning BP meds).  3. 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  Can reassess with TEE as above.  No intervention.  4. HTN: Poor control.  Negative renal artery dopplers and sleep study in past.  On multiple agents.  Quite high today, he says it is generally better at home.  - Stop low dose labetalol and start on Coreg 12.5 mg bid.   Followup 2 wks post-cath/TEE  Loralie Champagne 03/14/2019

## 2019-03-14 NOTE — H&P (View-Only) (Signed)
PCP: Lanae Boast, [place] Cardiology: Dr. [name] HF Cardiology: Dr. [name]  58 y.o. 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 [name] for evaluation of CHF. 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  Cardiolite in 5/16 showed no ischemia but possible apical infarction.  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  PYP scan was read as equivocal in 5/20 but probably not suggestive of transthyretin amyloidosis.   Patient's BP remains poorly controlled despite multiple medications. It is very high here, but he says that at home it tends to range SBP 130s-150s. Renal artery dopplers were unremarkable in 6/16 and he reports a negative sleep study in the past.   Patient is on disability.  He is a nonsmoker.  He is short of breath after walking about 200 yards, has been like this for several years now.  Mild dyspnea walking up stairs.  No orthopnea/PND.  No chest pain. Weight has been stable, he takes Lasix 40 mg daily.  Strong family history of CAD.   ECG (personally reviewed): NSR, LVH, inferior Qs  Labs (5/20): K 3.5, creatinine 1.23  PMH: 1. Gout 2. H/o NSVT/PVCs 3. 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  Seen on multiple echoes, first in 2015.  4. HTN: Renal artery dopplers in 6/16 were unremarkable. Sleep study about 2 years ago was negative per patient's report.  5. Mitral regurgitation: TEE in 2015 with mild to moderate MR.  - Echo (2/20): Moderate MR.  - Echo (5/20): Possible severe MR 6. 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  - Cardiolite (5/16): Possible apical infarct, no ischemia.  - Echo (7/19): EF 50-55%.  - Echo (2/20): EF 50-55%, moderate MR, TV fibroelastoma.  - PYP scan  (5/20): Grade 1 visually, H/CL 1.23.  Read as equivocal but likely negative for TTR amyloidosis.  - Echo (5/20): EF 40-45%, mildly dilated LV with mild LVH, normal RV size and systolic function, possible severe MR, moderate TR, 1 cm nodule on TV may be fibroelastoma, PASP 79 mmHg.   FH:  Father with MI at 17, brother with MI at 90, mother with CHF, MI.  HTN in multiple family members.   Social History   Socioeconomic History  . Marital status: Single    Spouse name: Not on file  . Number of children: Not on file  . Years of education: Not on file  . Highest education level: Not on file  Occupational History  . Not on file  Social Needs  . Financial resource strain: Not on file  . Food insecurity    Worry: Not on file    Inability: Not on file  . Transportation needs    Medical: Not on file    Non-medical: Not on file  Tobacco Use  . Smoking status: Never Smoker  . Smokeless tobacco: Never Used  Substance and Sexual Activity  . Alcohol use: No  . Drug use: No  . Sexual activity: Not on file  Lifestyle  . Physical activity    Days per week: Not on file    Minutes per session: Not on file  . Stress: Not on file  Relationships  . Social Herbalist on phone: Not on file    Gets together: Not on file    Attends religious  service: Not on file    Active member of club or organization: Not on file    Attends meetings of clubs or organizations: Not on file    Relationship status: Not on file  . Intimate partner violence    Fear of current or ex partner: Not on file    Emotionally abused: Not on file    Physically abused: Not on file    Forced sexual activity: Not on file  Other Topics Concern  . Not on file  Social History Narrative  . Not on file   ROS: All systems reviewed and negative except as per HPI.   Current Outpatient Medications  Medication Sig Dispense Refill  . allopurinol (ZYLOPRIM) 100 MG tablet Take 1 tablet (100 mg total) by mouth 2 (two) times  daily. 60 tablet 5  . amLODipine (NORVASC) 10 MG tablet Take 1 tablet (10 mg total) by mouth daily. 30 tablet 4  . aspirin EC 81 MG tablet Take 1 tablet (81 mg total) by mouth daily. 90 tablet 3  . benazepril (LOTENSIN) 40 MG tablet Take 1 tablet (40 mg total) by mouth daily. 30 tablet 6  . cetirizine (ZYRTEC) 10 MG tablet Take 1 tablet (10 mg total) by mouth daily. 30 tablet 11  . cloNIDine (CATAPRES) 0.2 MG tablet Take 1 tablet (0.2 mg total) by mouth daily. 90 tablet 3  . doxazosin (CARDURA) 4 MG tablet Take 1.5 tablets (6 mg total) by mouth at bedtime. 45 tablet 2  . furosemide (LASIX) 40 MG tablet Take 1 tablet (40 mg total) by mouth daily. 135 tablet 3  . gabapentin (NEURONTIN) 300 MG capsule Take 1 capsule (300 mg total) by mouth 3 (three) times daily. 90 capsule 3  . hydrALAZINE (APRESOLINE) 50 MG tablet TAKE 2 TABLETS BY MOUTH 3 TIMES DAILY 180 tablet 2  . isosorbide mononitrate (IMDUR) 30 MG 24 hr tablet Take 1 tablet (30 mg total) by mouth daily. 30 tablet 2  . ondansetron (ZOFRAN ODT) 8 MG disintegrating tablet Take 1 tablet (8 mg total) by mouth every 8 (eight) hours as needed for nausea or vomiting. 20 tablet 0  . potassium chloride SA (K-DUR) 20 MEQ tablet Take 1 tablet (20 mEq total) by mouth daily. 90 tablet 3  . spironolactone (ALDACTONE) 50 MG tablet Take 1 tablet (50 mg total) by mouth daily. 30 tablet 11  . tizanidine (ZANAFLEX) 2 MG capsule Take 1 capsule (2 mg total) by mouth 3 (three) times daily. 20 capsule 0  . traMADol (ULTRAM) 50 MG tablet Take 1 tablet (50 mg total) by mouth every 8 (eight) hours as needed for severe pain. 30 tablet 0  . triamcinolone cream (KENALOG) 0.1 % Apply 1 application topically 2 (two) times daily. 30 g 0  . carvedilol (COREG) 12.5 MG tablet Take 1 tablet (12.5 mg total) by mouth 2 (two) times daily. 60 tablet 5   Current Facility-Administered Medications  Medication Dose Route Frequency Provider Last Rate Last Dose  . 0.9 %  sodium chloride  infusion  500 mL Intravenous Once Nelida Meuse III, MD       BP (!) 172/116   Pulse 93   Wt 101.5 kg (223 lb 12.8 oz)   SpO2 98%   BMI 29.53 kg/m  General: NAD Neck: No JVD, no thyromegaly or thyroid nodule.  Lungs: Clear to auscultation bilaterally with normal respiratory effort. CV: Nondisplaced PMI.  Heart regular S1/S2, +S4, 3/6 HSM apex.  No peripheral edema.  No carotid  bruit.  Normal pedal pulses.  Abdomen: Soft, nontender, no hepatosplenomegaly, no distention.  Skin: Intact without lesions or rashes.  Neurologic: Alert and oriented x 3.  Psych: Normal affect. Extremities: No clubbing or cyanosis.  HEENT: Normal.   Assessment/Plan: 1. Chronic primarily systolic CHF: Echo in 2/83 was reviewed, EF 40-45% with normal RV, possible severe mitral regurgitation, and severe pulmonary hypertension.  PYP scan in 5/20 was read as equivocal, but I suspect this is not suggestive of transthyretin amyloidosis. Cause of cardiomyopathy is uncertain.  On exam today, he is not volume overloaded.  Symptoms are chronically NYHA class II-III.  I think we need to work out two issues to begin which => how severe is his mitral regurgitation and does he have coronary disease.   - I will arrange for TEE to assess mitral regurgitation.  He needs to take all his morning BP meds prior so we can see severity of MR when BP is as well-controlled as we can get it. We discussed risk/benefits and he agrees to procedure.  - He will need right/left heart catheterization.  We discussed risks/benefits and he agrees to procedure.  - Current Lasix dose appears adequate.  - Continue benazepril 40 mg daily, hydralazine/Imdur, and spironolactone 50 mg daily.  BMET today.  - Would stop labetalol and start Coreg 12.5 mg bid.   - He is pending a cardiac MRI.  As above, I think that he probably does not have transthyretin amyloidosis, but think cMRI helpful to look for other infiltrative disease.  Need to send myeloma panel and  urine immunofixation.  2. Mitral regurgitation: Prominent murmur, ?severe on last TTE.  This may explain elevated PA pressure on echo.   - As above, plan for TEE (make sure he takes morning BP meds).  3. 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  Can reassess with TEE as above.  No intervention.  4. HTN: Poor control.  Negative renal artery dopplers and sleep study in past.  On multiple agents.  Quite high today, he says it is generally better at home.  - Stop low dose labetalol and start on Coreg 12.5 mg bid.   Followup 2 wks post-cath/TEE  Loralie Champagne 03/14/2019

## 2019-03-15 ENCOUNTER — Telehealth (HOSPITAL_COMMUNITY): Payer: Self-pay

## 2019-03-15 LAB — IMMUNOFIXATION, URINE

## 2019-03-15 NOTE — Telephone Encounter (Signed)
-----   Message from Larey Dresser, MD sent at 03/14/2019 12:48 PM EDT ----- Make sure he takes his morning BP meds with sips of water prior to TEE

## 2019-03-15 NOTE — Addendum Note (Signed)
Addended by: Valeda Malm on: 03/15/2019 03:53 PM   Modules accepted: Orders, SmartSet

## 2019-03-15 NOTE — Telephone Encounter (Signed)
Called patient, patient aware to take all medications (except lasix and spiro) in the morning with small sip of water. Pt verbalized understanding.

## 2019-03-15 NOTE — Addendum Note (Signed)
Addended by: Valeda Malm on: 03/15/2019 03:11 PM   Modules accepted: Orders, SmartSet

## 2019-03-16 ENCOUNTER — Encounter (HOSPITAL_COMMUNITY): Payer: Self-pay | Admitting: *Deleted

## 2019-03-16 ENCOUNTER — Other Ambulatory Visit (HOSPITAL_COMMUNITY): Payer: Self-pay

## 2019-03-16 ENCOUNTER — Ambulatory Visit (HOSPITAL_COMMUNITY)
Admission: RE | Admit: 2019-03-16 | Discharge: 2019-03-16 | Disposition: A | Discharge status: Home / Self Care | Payer: Medicaid Other | Attending: Cardiology | Admitting: Cardiology

## 2019-03-16 ENCOUNTER — Encounter (HOSPITAL_COMMUNITY)
Admission: RE | Disposition: A | Discharge status: Home / Self Care | Payer: Self-pay | Source: Home / Self Care | Attending: Cardiology

## 2019-03-16 ENCOUNTER — Ambulatory Visit (HOSPITAL_BASED_OUTPATIENT_CLINIC_OR_DEPARTMENT_OTHER)
Admission: RE | Admit: 2019-03-16 | Discharge: 2019-03-16 | Disposition: A | Discharge status: Home / Self Care | Payer: Medicaid Other | Source: Ambulatory Visit | Attending: Cardiology | Admitting: Cardiology

## 2019-03-16 DIAGNOSIS — Z7982 Long term (current) use of aspirin: Secondary | ICD-10-CM | POA: Insufficient documentation

## 2019-03-16 DIAGNOSIS — Z79899 Other long term (current) drug therapy: Secondary | ICD-10-CM | POA: Diagnosis not present

## 2019-03-16 DIAGNOSIS — I272 Pulmonary hypertension, unspecified: Secondary | ICD-10-CM | POA: Diagnosis not present

## 2019-03-16 DIAGNOSIS — M109 Gout, unspecified: Secondary | ICD-10-CM | POA: Diagnosis not present

## 2019-03-16 DIAGNOSIS — I340 Nonrheumatic mitral (valve) insufficiency: Secondary | ICD-10-CM | POA: Diagnosis not present

## 2019-03-16 DIAGNOSIS — I5022 Chronic systolic (congestive) heart failure: Secondary | ICD-10-CM | POA: Insufficient documentation

## 2019-03-16 DIAGNOSIS — I2510 Atherosclerotic heart disease of native coronary artery without angina pectoris: Secondary | ICD-10-CM

## 2019-03-16 DIAGNOSIS — I509 Heart failure, unspecified: Secondary | ICD-10-CM

## 2019-03-16 DIAGNOSIS — I130 Hypertensive heart and chronic kidney disease with heart failure and stage 1 through stage 4 chronic kidney disease, or unspecified chronic kidney disease: Secondary | ICD-10-CM | POA: Insufficient documentation

## 2019-03-16 DIAGNOSIS — R0989 Other specified symptoms and signs involving the circulatory and respiratory systems: Secondary | ICD-10-CM

## 2019-03-16 HISTORY — PX: TEE WITHOUT CARDIOVERSION: SHX5443

## 2019-03-16 HISTORY — PX: RIGHT/LEFT HEART CATH AND CORONARY ANGIOGRAPHY: CATH118266

## 2019-03-16 HISTORY — PX: BUBBLE STUDY: SHX6837

## 2019-03-16 LAB — POCT I-STAT EG7
Acid-base deficit: 4 mmol/L — ABNORMAL HIGH (ref 0.0–2.0)
Acid-base deficit: 5 mmol/L — ABNORMAL HIGH (ref 0.0–2.0)
Bicarbonate: 20 mmol/L (ref 20.0–28.0)
Bicarbonate: 20.8 mmol/L (ref 20.0–28.0)
Calcium, Ion: 1.16 mmol/L (ref 1.15–1.40)
Calcium, Ion: 1.28 mmol/L (ref 1.15–1.40)
HCT: 43 % (ref 39.0–52.0)
HCT: 45 % (ref 39.0–52.0)
Hemoglobin: 14.6 g/dL (ref 13.0–17.0)
Hemoglobin: 15.3 g/dL (ref 13.0–17.0)
O2 Saturation: 74 %
O2 Saturation: 76 %
Potassium: 3.9 mmol/L (ref 3.5–5.1)
Potassium: 4.1 mmol/L (ref 3.5–5.1)
Sodium: 141 mmol/L (ref 135–145)
Sodium: 142 mmol/L (ref 135–145)
TCO2: 21 mmol/L — ABNORMAL LOW (ref 22–32)
TCO2: 22 mmol/L (ref 22–32)
pCO2, Ven: 36.3 mmHg — ABNORMAL LOW (ref 44.0–60.0)
pCO2, Ven: 37.5 mmHg — ABNORMAL LOW (ref 44.0–60.0)
pH, Ven: 7.348 (ref 7.250–7.430)
pH, Ven: 7.352 (ref 7.250–7.430)
pO2, Ven: 41 mmHg (ref 32.0–45.0)
pO2, Ven: 42 mmHg (ref 32.0–45.0)

## 2019-03-16 SURGERY — ECHOCARDIOGRAM, TRANSESOPHAGEAL
Anesthesia: Moderate Sedation

## 2019-03-16 SURGERY — RIGHT/LEFT HEART CATH AND CORONARY ANGIOGRAPHY
Anesthesia: LOCAL

## 2019-03-16 MED ORDER — FENTANYL CITRATE (PF) 100 MCG/2ML IJ SOLN
INTRAMUSCULAR | Status: AC
  Filled 2019-03-16: qty 2

## 2019-03-16 MED ORDER — FENTANYL CITRATE (PF) 100 MCG/2ML IJ SOLN
INTRAMUSCULAR | Status: DC | PRN
  Administered 2019-03-16 (×2): 25 ug via INTRAVENOUS

## 2019-03-16 MED ORDER — FENTANYL CITRATE (PF) 100 MCG/2ML IJ SOLN
INTRAMUSCULAR | Status: DC | PRN
  Administered 2019-03-16: 25 ug via INTRAVENOUS

## 2019-03-16 MED ORDER — SODIUM CHLORIDE 0.9 % IV SOLN: INTRAVENOUS | Status: DC

## 2019-03-16 MED ORDER — MIDAZOLAM HCL 2 MG/2ML IJ SOLN
INTRAMUSCULAR | Status: AC
  Filled 2019-03-16: qty 2

## 2019-03-16 MED ORDER — LIDOCAINE HCL (PF) 1 % IJ SOLN
INTRAMUSCULAR | Status: DC | PRN
  Administered 2019-03-16 (×2): 2 mL via SUBCUTANEOUS

## 2019-03-16 MED ORDER — DIPHENHYDRAMINE HCL 50 MG/ML IJ SOLN
INTRAMUSCULAR | Status: AC
  Filled 2019-03-16: qty 1

## 2019-03-16 MED ORDER — SODIUM CHLORIDE 0.9% FLUSH: 3.0000 mL | Freq: Two times a day (BID) | INTRAVENOUS | Status: DC

## 2019-03-16 MED ORDER — MIDAZOLAM HCL (PF) 5 MG/ML IJ SOLN
INTRAMUSCULAR | Status: AC
  Filled 2019-03-16: qty 2

## 2019-03-16 MED ORDER — HEPARIN (PORCINE) IN NACL 1000-0.9 UT/500ML-% IV SOLN
INTRAVENOUS | Status: DC | PRN
  Administered 2019-03-16 (×2): 500 mL

## 2019-03-16 MED ORDER — ONDANSETRON HCL 4 MG/2ML IJ SOLN: 4.0000 mg | Freq: Four times a day (QID) | INTRAMUSCULAR | Status: DC | PRN

## 2019-03-16 MED ORDER — ACETAMINOPHEN 325 MG PO TABS: 650.0000 mg | ORAL_TABLET | ORAL | Status: DC | PRN

## 2019-03-16 MED ORDER — ASPIRIN 81 MG PO CHEW: 81.0000 mg | CHEWABLE_TABLET | ORAL | Status: DC

## 2019-03-16 MED ORDER — SODIUM CHLORIDE 0.9 % IV SOLN
INTRAVENOUS | Status: DC
  Administered 2019-03-16: 09:00:00 via INTRAVENOUS

## 2019-03-16 MED ORDER — SODIUM CHLORIDE 0.9% FLUSH: 3.0000 mL | INTRAVENOUS | Status: DC | PRN

## 2019-03-16 MED ORDER — HEPARIN SODIUM (PORCINE) 1000 UNIT/ML IJ SOLN
INTRAMUSCULAR | Status: AC
  Filled 2019-03-16: qty 1

## 2019-03-16 MED ORDER — BUTAMBEN-TETRACAINE-BENZOCAINE 2-2-14 % EX AERO
INHALATION_SPRAY | CUTANEOUS | Status: DC | PRN
  Administered 2019-03-16: 2 via TOPICAL

## 2019-03-16 MED ORDER — HEPARIN SODIUM (PORCINE) 1000 UNIT/ML IJ SOLN
INTRAMUSCULAR | Status: DC | PRN
  Administered 2019-03-16: 5000 [IU] via INTRAVENOUS

## 2019-03-16 MED ORDER — LABETALOL HCL 5 MG/ML IV SOLN: 10.0000 mg | INTRAVENOUS | Status: DC | PRN

## 2019-03-16 MED ORDER — VERAPAMIL HCL 2.5 MG/ML IV SOLN
INTRAVENOUS | Status: DC | PRN
  Administered 2019-03-16: 10 mL via INTRA_ARTERIAL

## 2019-03-16 MED ORDER — MIDAZOLAM HCL (PF) 10 MG/2ML IJ SOLN
INTRAMUSCULAR | Status: DC | PRN
  Administered 2019-03-16 (×2): 2 mg via INTRAVENOUS

## 2019-03-16 MED ORDER — HYDRALAZINE HCL 20 MG/ML IJ SOLN
10.0000 mg | INTRAMUSCULAR | Status: DC | PRN
  Filled 2019-03-16: qty 1

## 2019-03-16 MED ORDER — SODIUM CHLORIDE 0.9 % IV SOLN: 250.0000 mL | INTRAVENOUS | Status: DC | PRN

## 2019-03-16 MED ORDER — IOHEXOL 350 MG/ML SOLN
INTRAVENOUS | Status: DC | PRN
  Administered 2019-03-16: 55 mL via INTRACARDIAC

## 2019-03-16 MED ORDER — VERAPAMIL HCL 2.5 MG/ML IV SOLN
INTRAVENOUS | Status: AC
  Filled 2019-03-16: qty 2

## 2019-03-16 MED ORDER — MIDAZOLAM HCL 2 MG/2ML IJ SOLN
INTRAMUSCULAR | Status: DC | PRN
  Administered 2019-03-16: 1 mg via INTRAVENOUS

## 2019-03-16 MED ORDER — METOPROLOL TARTRATE 5 MG/5ML IV SOLN
INTRAVENOUS | Status: AC
  Filled 2019-03-16: qty 5

## 2019-03-16 MED ORDER — LIDOCAINE HCL (PF) 1 % IJ SOLN
INTRAMUSCULAR | Status: AC
  Filled 2019-03-16: qty 30

## 2019-03-16 MED ORDER — METOPROLOL TARTRATE 5 MG/5ML IV SOLN
INTRAVENOUS | Status: DC | PRN
  Administered 2019-03-16: 5 mg via INTRAVENOUS

## 2019-03-16 MED ORDER — HEPARIN (PORCINE) IN NACL 1000-0.9 UT/500ML-% IV SOLN
INTRAVENOUS | Status: AC
  Filled 2019-03-16: qty 1000

## 2019-03-16 SURGICAL SUPPLY — 12 items
CATH 5FR JL3.5 JR4 ANG PIG MP (CATHETERS) ×1 IMPLANT
CATH BALLN WEDGE 5F 110CM (CATHETERS) ×1 IMPLANT
DEVICE RAD COMP TR BAND LRG (VASCULAR PRODUCTS) ×1 IMPLANT
ELECT DEFIB PAD ADLT CADENCE (PAD) ×1 IMPLANT
GLIDESHEATH SLEND SS 6F .021 (SHEATH) ×1 IMPLANT
GUIDEWIRE INQWIRE 1.5J.035X260 (WIRE) IMPLANT
INQWIRE 1.5J .035X260CM (WIRE) ×2
KIT HEART LEFT (KITS) ×2 IMPLANT
PACK CARDIAC CATHETERIZATION (CUSTOM PROCEDURE TRAY) ×2 IMPLANT
SHEATH GLIDE SLENDER 4/5FR (SHEATH) ×1 IMPLANT
TRANSDUCER W/STOPCOCK (MISCELLANEOUS) ×2 IMPLANT
TUBING CIL FLEX 10 FLL-RA (TUBING) ×2 IMPLANT

## 2019-03-16 NOTE — Interval H&P Note (Signed)
History and Physical Interval Note:  03/16/2019 12:51 PM  Daniel Joseph  has presented today for surgery, with the diagnosis of pulmonary hypertention.  The various methods of treatment have been discussed with the patient and family. After consideration of risks, benefits and other options for treatment, the patient has consented to  Procedure(s): RIGHT/LEFT HEART CATH AND CORONARY ANGIOGRAPHY (N/A) as a surgical intervention.  The patient's history has been reviewed, patient examined, no change in status, stable for surgery.  I have reviewed the patient's chart and labs.  Questions were answered to the patient's satisfaction.     [name] [employer]

## 2019-03-16 NOTE — Interval H&P Note (Signed)
History and Physical Interval Note:  03/16/2019 10:09 AM  Daniel Joseph  has presented today for surgery, with the diagnosis of MITRAL REGURGITATION.  The various methods of treatment have been discussed with the patient and family. After consideration of risks, benefits and other options for treatment, the patient has consented to  Procedure(s): TRANSESOPHAGEAL ECHOCARDIOGRAM (TEE) (N/A) as a surgical intervention.  The patient's history has been reviewed, patient examined, no change in status, stable for surgery.  I have reviewed the patient's chart and labs.  Questions were answered to the patient's satisfaction.     Edenilson Austad [employer]

## 2019-03-16 NOTE — Discharge Instructions (Signed)
Keep right wrist elevated at heart level for 24 hours   Radial Site Care  This sheet gives you information about how to care for yourself after your procedure. Your health care provider may also give you more specific instructions. If you have problems or questions, contact your health care provider. What can I expect after the procedure? After the procedure, it is common to have:  Bruising and tenderness at the catheter insertion area. Follow these instructions at home: Medicines  Take over-the-counter and prescription medicines only as told by your health care provider. Insertion site care  Follow instructions from your health care provider about how to take care of your insertion site. Make sure you: ? Wash your hands with soap and water before you change your bandage (dressing). If soap and water are not available, use hand sanitizer. ? Remove your dressing as told by your health care provider. In 24-48 hours  Check your insertion site every day for signs of infection. Check for: ? Redness, swelling, or pain. ? Fluid or blood. ? Pus or a bad smell. ? Warmth.  Do not take baths, swim, or use a hot tub until your health care provider approves.  You may shower 24-48 hours after the procedure, or as directed by your health care provider. ? Remove the dressing and gently wash the site with plain soap and water. ? Pat the area dry with a clean towel. ? Do not rub the site. That could cause bleeding.  Do not apply powder or lotion to the site. Activity   For 24 hours after the procedure, or as directed by your health care provider: ? Do not flex or bend the affected arm. ? Do not push or pull heavy objects with the affected arm. ? Do not drive yourself home from the hospital or clinic. You may drive 24 hours after the procedure unless your health care provider tells you not to. ? Do not operate machinery or power tools.  Do not lift anything that is heavier than 10 lb (4.5 kg),  or the limit that you are told, until your health care provider says that it is safe. For 5 days  Ask your health care provider when it is okay to: ? Return to work or school. ? Resume usual physical activities or sports. ? Resume sexual activity. General instructions  If the catheter site starts to bleed, raise your arm and put firm pressure on the site. If the bleeding does not stop, get help right away. This is a medical emergency.  If you went home on the same day as your procedure, a responsible adult should be with you for the first 24 hours after you arrive home.  Keep all follow-up visits as told by your health care provider. This is important. Contact a health care provider if:  You have a fever.  You have redness, swelling, or yellow drainage around your insertion site. Get help right away if:  You have unusual pain at the radial site.  The catheter insertion area swells very fast.  The insertion area is bleeding, and the bleeding does not stop when you hold steady pressure on the area.  Your arm or hand becomes pale, cool, tingly, or numb. These symptoms may represent a serious problem that is an emergency. Do not wait to see if the symptoms will go away. Get medical help right away. Call your local emergency services (911 in the [place] Do not drive yourself to the hospital. Summary  After  the procedure, it is common to have bruising and tenderness at the site.  Follow instructions from your health care provider about how to take care of your radial site wound. Check the wound every day for signs of infection.  Do not lift anything that is heavier than 10 lb (4.5 kg), or the limit that you are told, until your health care provider says that it is safe. This information is not intended to replace advice given to you by your health care provider. Make sure you discuss any questions you have with your health care provider. Document Released: 10/05/2010 Document Revised:  10/08/2017 Document Reviewed: 10/08/2017 Elsevier Patient Education  2020 [employer].

## 2019-03-16 NOTE — CV Procedure (Addendum)
Procedure: TEE  Sedation: Versed 4 mg IV, Fentanyl 50 mcg IV  Indication: Mitral regurgitation  Findings: Please see echo section for full report.  Mild LV dilation with mild LV hypertrophy.  EF 50% with mild diffuse hypokinesis. The RV appeared mildly dilated with normal systolic function.  Moderate left atrial enlargement, no LA appendage thrombus.  Normal right atrium.  Negative bubble study, no evidence for ASD or PFO.  Mild TR, peak RV-RA gradient 50 mmHg.  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  Trivial PI.  Trileaflet aortic valve, no stenosis or regurgitation.  The mitral valve anterior leaflet with mild prolapse and posteriorly-directed, moderate mitral regurgitation (moderate visually, ERO by PISA only 0.17 cm^2).  There was flattening but not reversal of the pulmonary vein systolic doppler signal.  Normal caliber thoracic aorta, mild plaque.   Impression: Mitral regurgitation is only noted to be moderate.  BP controlled during procedure, this may help his MR.   Loralie Champagne 03/16/2019 10:39 AM

## 2019-03-16 NOTE — Progress Notes (Signed)
Pt s/p R/L heart cath, per MD, pt ordered for VQ scan and PFT's.

## 2019-03-16 NOTE — Progress Notes (Signed)
  Echocardiogram Echocardiogram Transesophageal has been performed.  Johny Chess 03/16/2019, 10:47 AM

## 2019-03-17 ENCOUNTER — Telehealth: Payer: Self-pay

## 2019-03-17 ENCOUNTER — Encounter (HOSPITAL_COMMUNITY): Payer: Self-pay | Admitting: Cardiology

## 2019-03-17 DIAGNOSIS — I272 Pulmonary hypertension, unspecified: Secondary | ICD-10-CM

## 2019-03-17 NOTE — Telephone Encounter (Signed)
Spoke with the patient, he expressed understanding about the sleep study order.

## 2019-03-17 NOTE — Telephone Encounter (Signed)
-----   Message from Sueanne Margarita, MD sent at 03/16/2019  4:43 PM EDT ----- Please order an Itamar sleep study to rule out OSA given his pulmonary HTN found at cath  Daniel Joseph ----- Message ----- From: Larey Dresser, MD Sent: 03/16/2019   4:28 PM EDT To: Sueanne Margarita, MD, Lanae Boast, FNP

## 2019-03-17 NOTE — Telephone Encounter (Signed)
Patient Name: Daniel Joseph        DOB: [birth year]      Height:      Weight: 223 lb 12.8 oz  Office Name:  [facility]         Referring Provider: Dr. [name]  Today's Date: 03/17/19  STOP BANG RISK ASSESSMENT S (snore) Have you been told that you snore?     NO   T (tired) Are you often tired, fatigued, or sleepy during the day?   YES  O (obstruction) Do you stop breathing, choke, or gasp during sleep? YES   P (pressure) Do you have or are you being treated for high blood pressure? YES   B (BMI) Is your body index greater than 35 kg/m? NO   A (age) Are you 56 years old or older? YES   N (neck) Do you have a neck circumference greater than 16 inches?   NO   G (gender) Are you a male? YES   TOTAL STOP/BANG "YES" ANSWERS 5                                                                       For Office Use Only              Procedure Order Form    YES to 3+ Stop Bang questions OR two clinical symptoms - patient qualifies for WatchPAT (CPT 95800)     Submit: This Form + Patient Face Sheet + Clinical Note via CloudPAT or Fax: [phone]         Clinical Notes: Will consult Sleep Specialist and refer for management of therapy due to patient increased risk of Sleep Apnea. Ordering a sleep study due to the following two clinical symptoms: Excessive daytime sleepiness G47.10 / Gastroesophageal reflux K21.9 / Nocturia R35.1 / Morning Headaches G44.221 / Difficulty concentrating R41.840 / Memory problems or poor judgment G31.84 / Personality changes or irritability R45.4 / Loud snoring R06.83 / Depression F32.9 / Unrefreshed by sleep G47.8 / Impotence N52.9 / History of high blood pressure R03.0 / Insomnia G47.00    I understand that I am proceeding with a home sleep apnea test as ordered by my treating physician. I understand that untreated sleep apnea is a serious cardiovascular risk factor and it is my responsibility to perform the test and seek  management for sleep apnea. I will be contacted with the results and be managed for sleep apnea by a local sleep physician. I will be receiving equipment and further instructions from [facility]. I shall promptly ship back the equipment via the included mailing label. I understand my insurance will be billed for the test and as the patient I am responsible for any insurance related out-of-pocket costs incurred. I have been provided with written instructions and can call for additional video or telephonic instruction, with 24-hour availability of qualified personnel to answer any questions: Patient Help Desk [phone].  Patient Signature ______________________________________________________   Date______________________ Patient Telemedicine Verbal Consent

## 2019-03-23 ENCOUNTER — Ambulatory Visit (AMBULATORY_SURGERY_CENTER): Payer: Medicaid Other | Admitting: *Deleted

## 2019-03-23 ENCOUNTER — Other Ambulatory Visit: Payer: Self-pay

## 2019-03-23 VITALS — Ht 73.0 in | Wt 225.0 lb

## 2019-03-23 DIAGNOSIS — Z1211 Encounter for screening for malignant neoplasm of colon: Secondary | ICD-10-CM

## 2019-03-23 MED ORDER — POLYETHYLENE GLYCOL 3350 17 GM/SCOOP PO POWD: 1.0000 | ORAL | 0 refills | Status: DC

## 2019-03-23 MED ORDER — SUPREP BOWEL PREP KIT 17.5-3.13-1.6 GM/177ML PO SOLN: 1.0000 | Freq: Once | ORAL | 0 refills | Status: AC

## 2019-03-23 MED ORDER — DULCOLAX 5 MG PO TBEC: 5.0000 mg | DELAYED_RELEASE_TABLET | Freq: Every day | ORAL | 0 refills | Status: DC | PRN

## 2019-03-23 MED FILL — SUPREP BOWEL PREP KIT: 17.5-3.13-1 | 1 days supply | Qty: 354 | Fill #0

## 2019-03-23 MED FILL — POLYETHYLENE GLYCOL 3350 PO: 17 | 1 days supply | Qty: 238 | Fill #0

## 2019-03-23 NOTE — Progress Notes (Signed)
No egg or soy allergy known to patient  No issues with past sedation with any surgeries  or procedures, no intubation problems  No diet pills per patient No home 02 use per patient  No blood thinners per patient  Pt denies issues with constipation  No A fib or A flutter  EMMI video sent to pt's e mail   Pt verified name, DOB, address and insurance during PV today. Pt mailed instruction packet to included paper to complete and mail back to [facility] with addressed and stamped envelope, Emmi video, copy of consent form to read and not return, and instructions. PV completed over the phone. Pt encouraged to call with questions or issues   Pt is aware that care partner will wait in the car during proceudre; if they feel like they will be too hot to wait in the car; they may wait in the lobby.  We want them to wear a mask (we do not have any that we can provide them), practice social distancing, and we will check their temperatures when they get here.  I did remind patient that their care partner needs to stay in the parking lot the entire time. Pt will wear mask into building.

## 2019-03-24 ENCOUNTER — Encounter: Payer: Self-pay | Admitting: Family Medicine

## 2019-03-26 ENCOUNTER — Encounter (HOSPITAL_COMMUNITY): Payer: Self-pay | Admitting: Cardiology

## 2019-03-26 ENCOUNTER — Other Ambulatory Visit: Payer: Self-pay

## 2019-03-26 ENCOUNTER — Ambulatory Visit (HOSPITAL_COMMUNITY)
Admission: RE | Admit: 2019-03-26 | Discharge: 2019-03-26 | Disposition: A | Discharge status: Home / Self Care | Payer: Medicaid Other | Source: Ambulatory Visit | Attending: Cardiology | Admitting: Cardiology

## 2019-03-26 VITALS — BP 155/110 | HR 71 | Wt 224.0 lb

## 2019-03-26 DIAGNOSIS — Z79899 Other long term (current) drug therapy: Secondary | ICD-10-CM | POA: Insufficient documentation

## 2019-03-26 DIAGNOSIS — I340 Nonrheumatic mitral (valve) insufficiency: Secondary | ICD-10-CM | POA: Diagnosis not present

## 2019-03-26 DIAGNOSIS — I428 Other cardiomyopathies: Secondary | ICD-10-CM | POA: Diagnosis not present

## 2019-03-26 DIAGNOSIS — I5032 Chronic diastolic (congestive) heart failure: Secondary | ICD-10-CM | POA: Insufficient documentation

## 2019-03-26 DIAGNOSIS — Z7982 Long term (current) use of aspirin: Secondary | ICD-10-CM | POA: Diagnosis not present

## 2019-03-26 DIAGNOSIS — Z8249 Family history of ischemic heart disease and other diseases of the circulatory system: Secondary | ICD-10-CM | POA: Insufficient documentation

## 2019-03-26 DIAGNOSIS — I5042 Chronic combined systolic (congestive) and diastolic (congestive) heart failure: Secondary | ICD-10-CM | POA: Diagnosis not present

## 2019-03-26 DIAGNOSIS — M109 Gout, unspecified: Secondary | ICD-10-CM | POA: Insufficient documentation

## 2019-03-26 DIAGNOSIS — I078 Other rheumatic tricuspid valve diseases: Secondary | ICD-10-CM

## 2019-03-26 DIAGNOSIS — I272 Pulmonary hypertension, unspecified: Secondary | ICD-10-CM

## 2019-03-26 DIAGNOSIS — I110 Hypertensive heart disease with heart failure: Secondary | ICD-10-CM | POA: Diagnosis not present

## 2019-03-26 DIAGNOSIS — I341 Nonrheumatic mitral (valve) prolapse: Secondary | ICD-10-CM | POA: Diagnosis not present

## 2019-03-26 DIAGNOSIS — I2510 Atherosclerotic heart disease of native coronary artery without angina pectoris: Secondary | ICD-10-CM | POA: Insufficient documentation

## 2019-03-26 LAB — BASIC METABOLIC PANEL
Anion gap: 12 (ref 5–15)
BUN: 16 mg/dL (ref 6–20)
CO2: 21 mmol/L — ABNORMAL LOW (ref 22–32)
Calcium: 9.2 mg/dL (ref 8.9–10.3)
Chloride: 104 mmol/L (ref 98–111)
Creatinine, Ser: 1.34 mg/dL — ABNORMAL HIGH (ref 0.61–1.24)
GFR calc Af Amer: >60 mL/min (ref >60)
GFR calc non Af Amer: 59 mL/min — ABNORMAL LOW (ref >60)
Glucose, Bld: 86 mg/dL (ref 70–99)
Potassium: 3.8 mmol/L (ref 3.5–5.1)
Sodium: 137 mmol/L (ref 135–145)

## 2019-03-26 MED ORDER — ROSUVASTATIN CALCIUM 10 MG PO TABS: 10.0000 mg | ORAL_TABLET | Freq: Every day | ORAL | 3 refills | Status: DC

## 2019-03-26 MED ORDER — CARVEDILOL 12.5 MG PO TABS: 18.7500 mg | ORAL_TABLET | Freq: Two times a day (BID) | ORAL | 5 refills | Status: DC

## 2019-03-26 MED FILL — ROSUVASTATIN CALCIUM 10 MG: 10 | 90 days supply | Qty: 90 | Fill #0

## 2019-03-26 MED FILL — cloNIDine HCL 0.2 MG TABS: 0.2 | 90 days supply | Qty: 90 | Fill #1

## 2019-03-26 MED FILL — CARVEDILOL 12.5 MG TABLET: 12.5 | 30 days supply | Qty: 90 | Fill #0

## 2019-03-26 MED FILL — BENAZEPRIL HCL 40 MG TABLET: 40 | 90 days supply | Qty: 90 | Fill #1

## 2019-03-26 NOTE — Patient Instructions (Addendum)
INCREASE Coreg to 18.75mg  (1.5 tabs) twice a day.  START Crestor 10mg  (1 tab) daily  TAKE Spironolactone 50mg  (1 tab) daily  Labs today, repeat in 2 weeks and 2 months. Appointment made today. We will only contact you if something comes back abnormal or we need to make some changes. Otherwise no news is good news!  Your physician ordered for you to have a Cardiac (Heart) MRI.  You will be called to schedule this appointment.   Your physician recommends that you schedule a follow-up appointment in: 2 weeks with the nurse for blood pressure check and lab work AND 6 weeks with Amy, Nurse Practitioner  At the [facility], you and your health needs are our priority. As part of our continuing mission to provide you with exceptional heart care, we have created designated Provider Care Teams. These Care Teams include your primary Cardiologist (physician) and Advanced Practice Providers (APPs- Physician Assistants and Nurse Practitioners) who all work together to provide you with the care you need, when you need it.   You may see any of the following providers on your designated Care Team at your next follow up: Marland Kitchen Dr Glori Bickers . Dr Loralie Champagne . Darrick Grinder, NP

## 2019-03-28 NOTE — Progress Notes (Signed)
PCP: Lanae Boast, [place] Cardiology: Dr. [name] HF Cardiology: Dr. [name]  58 y.o. 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 [name] for evaluation of CHF. 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  Cardiolite in 5/16 showed no ischemia but possible apical infarction.  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  PYP scan was read as equivocal in 5/20 but probably not suggestive of transthyretin amyloidosis.   TEE was done in 6/20 to assess severity of MR.  EF was 50% with mild mitral valve prolapse and moderate MR.  RHC/LHC showed nonobstructive CAD, pulmonary venous hypertension, relatively controlled filling pressures.   Patient's BP remains poorly controlled despite multiple medications. It is very high here, but he says that at home it tends to range SBP 130s-150s still. Renal artery dopplers were unremarkable in 6/16.   Patient presents for followup of CHF.  Generally, he has been feeling better recently.  No dyspnea walking on flat ground.  No orthopnea/PND.  Fatigues easily in the heat.  No chest pain. No lightheadedness.   Labs (5/20): K 3.5, creatinine 1.23 Labs (6/20): Urine immunofixation negative, K 4.2, creatinine 1.36  PMH: 1. Gout 2. H/o NSVT/PVCs 3. 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  Seen on multiple echoes, first in 2015.  4. HTN: Renal artery dopplers in 6/16 were unremarkable. Sleep study about 2 years ago was negative per patient's report.  5. Mitral regurgitation: TEE in 2015 with mild to moderate MR.  - Echo (2/20): Moderate MR.  - Echo (5/20): Possible severe MR - TEE (6/20): Mild mitral valve prolapse with moderate MR.  6. Chronic primarily systolic CHF:  - TEE (48/18): EF 40-45%, mild-moderate MR, possible tricuspid valve  fibroelastoma.  - Cardiolite (5/16): Possible apical infarct, no ischemia.  - Echo (7/19): EF 50-55%.  - Echo (2/20): EF 50-55%, moderate MR, TV fibroelastoma.  - PYP scan (5/20): Grade 1 visually, H/CL 1.23.  Read as equivocal but likely negative for TTR amyloidosis.  - Echo (5/20): EF 40-45%, mildly dilated LV with mild LVH, normal RV size and systolic function, possible severe MR, moderate TR, 1 cm nodule on TV may be fibroelastoma, PASP 79 mmHg.  - TEE (6/20): EF 50%, mild LV dilation, mild LVH, moderate LAE, tricuspid valve mass likely fibroelastoma, mild MVP with moderate MR.  - LHC/RHC (6/20): 50% LAD, 30% pRCA; mean RA 4, PA 55/21 mean 34, mean PCWP 17, CI 2.66, PVR 2.8 WU.   FH:  Father with MI at 43, brother with MI at 69, mother with CHF, MI.  HTN in multiple family members.   Social History   Socioeconomic History  . Marital status: Single    Spouse name: Not on file  . Number of children: Not on file  . Years of education: Not on file  . Highest education level: Not on file  Occupational History  . Not on file  Social Needs  . Financial resource strain: Not on file  . Food insecurity    Worry: Not on file    Inability: Not on file  . Transportation needs    Medical: Not on file    Non-medical: Not on file  Tobacco Use  . Smoking status: Never Smoker  . Smokeless tobacco: Never Used  Substance and Sexual Activity  . Alcohol use: No  . Drug use: No  .  Sexual activity: Not on file  Lifestyle  . Physical activity    Days per week: Not on file    Minutes per session: Not on file  . Stress: Not on file  Relationships  . Social Herbalist on phone: Not on file    Gets together: Not on file    Attends religious service: Not on file    Active member of club or organization: Not on file    Attends meetings of clubs or organizations: Not on file    Relationship status: Not on file  . Intimate partner violence    Fear of current or ex partner: Not on file     Emotionally abused: Not on file    Physically abused: Not on file    Forced sexual activity: Not on file  Other Topics Concern  . Not on file  Social History Narrative  . Not on file   ROS: All systems reviewed and negative except as per HPI.   Current Outpatient Medications  Medication Sig Dispense Refill  . acetaminophen (TYLENOL) 650 MG CR tablet Take 1,300 mg by mouth every 8 (eight) hours as needed for pain.    Marland Kitchen allopurinol (ZYLOPRIM) 100 MG tablet Take 1 tablet (100 mg total) by mouth 2 (two) times daily. 60 tablet 5  . amLODipine (NORVASC) 10 MG tablet Take 1 tablet (10 mg total) by mouth daily. 30 tablet 4  . aspirin EC 81 MG tablet Take 1 tablet (81 mg total) by mouth daily. 90 tablet 3  . benazepril (LOTENSIN) 40 MG tablet Take 1 tablet (40 mg total) by mouth daily. 30 tablet 6  . bisacodyl (DULCOLAX) 5 MG EC tablet Take 1 tablet (5 mg total) by mouth daily as needed for moderate constipation. 4 tablet 0  . carvedilol (COREG) 12.5 MG tablet Take 1.5 tablets (18.75 mg total) by mouth 2 (two) times daily. 90 tablet 5  . cetirizine (ZYRTEC) 10 MG tablet Take 1 tablet (10 mg total) by mouth daily. 30 tablet 11  . cloNIDine (CATAPRES) 0.2 MG tablet Take 1 tablet (0.2 mg total) by mouth daily. 90 tablet 3  . doxazosin (CARDURA) 4 MG tablet Take 1.5 tablets (6 mg total) by mouth at bedtime. 45 tablet 2  . furosemide (LASIX) 40 MG tablet Take 40 mg by mouth 2 (two) times daily.    Marland Kitchen gabapentin (NEURONTIN) 300 MG capsule Take 1 capsule (300 mg total) by mouth 3 (three) times daily. 90 capsule 3  . hydrALAZINE (APRESOLINE) 50 MG tablet Take 100 mg by mouth 3 (three) times daily.    . isosorbide mononitrate (IMDUR) 30 MG 24 hr tablet Take 1 tablet (30 mg total) by mouth daily. 30 tablet 2  . ondansetron (ZOFRAN ODT) 8 MG disintegrating tablet Take 1 tablet (8 mg total) by mouth every 8 (eight) hours as needed for nausea or vomiting. 20 tablet 0  . polyethylene glycol powder  (GLYCOLAX/MIRALAX) 17 GM/SCOOP powder Take 255 g by mouth as directed. 119 g 0  . potassium chloride SA (K-DUR) 20 MEQ tablet Take 1 tablet (20 mEq total) by mouth daily. 90 tablet 3  . spironolactone (ALDACTONE) 50 MG tablet Take 1 tablet (50 mg total) by mouth daily. 30 tablet 11  . tizanidine (ZANAFLEX) 2 MG capsule Take 2 mg by mouth 3 (three) times daily.    . traMADol (ULTRAM) 50 MG tablet Take 50 mg by mouth every 8 (eight) hours as needed.    . triamcinolone  cream (KENALOG) 0.1 % Apply 1 application topically 2 (two) times daily as needed.    . rosuvastatin (CRESTOR) 10 MG tablet Take 1 tablet (10 mg total) by mouth daily. 90 tablet 3   Current Facility-Administered Medications  Medication Dose Route Frequency Provider Last Rate Last Dose  . 0.9 %  sodium chloride infusion  500 mL Intravenous Once Nelida Meuse III, MD       BP (!) 155/110   Pulse 71   Wt 101.6 kg (224 lb)   SpO2 98%   BMI 29.55 kg/m  General: NAD Neck: No JVD, no thyromegaly or thyroid nodule.  Lungs: Clear to auscultation bilaterally with normal respiratory effort. CV: Nondisplaced PMI.  Heart regular S1/S2, +S4, 2/6 HSM apex.  No peripheral edema.  No carotid bruit.  Normal pedal pulses.  Abdomen: Soft, nontender, no hepatosplenomegaly, no distention.  Skin: Intact without lesions or rashes.  Neurologic: Alert and oriented x 3.  Psych: Normal affect. Extremities: No clubbing or cyanosis.  HEENT: Normal.   Assessment/Plan: 1. 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  Echo in 5/20 was reviewed, EF 40-45% with normal RV, possible severe mitral regurgitation, and severe pulmonary hypertension.  PYP scan in 5/20 was read as equivocal, but I suspect this is not suggestive of transthyretin amyloidosis. TEE in 5/20 showed EF 50%, mild LV dilation, moderate MR.  RHC/LHC showed mildly elevated PCWP, pulmonary venous hypertension, and nonobstructive CAD.  On  exam today, he is not volume overloaded.  Symptoms are somewhat improved, NYHA class II.  - Current Lasix dose appears adequate. BMET today.  - Continue benazepril 40 mg daily and hydralazine/Imdur.  - With BP still elevated, increase Coreg to 18.75 mg bid and increase spironolactone to 50 mg daily.  BMET 2 wks.    - He is pending a cardiac MRI.  As above, I think that he probably does not have transthyretin amyloidosis, but think cMRI helpful to look for other infiltrative disease.  Need to send myeloma panel today.  2. Mitral regurgitation: Prominent murmur, ?severe on last TTE but TEE in 6/20 showed mild mitral valve prolapse with only moderate MR.   - Continue to work to control BP as this will likely help limit MR.   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  4. HTN: Poor control.  Negative renal artery dopplers and sleep study in past.  On multiple agents.  Quite high today, he says it is generally better at home.  - Increase Coreg and spironolactone as above.  - OSA may potentiate hypertension (daytime sleepiness and snoring).  Home sleep study ordered.  5. 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  - I think he would benefit from a statin, will add Crestor 10 mg daily. Lipids/LFTs in 2 months.   Followup 6 wks with NP  Loralie Champagne 03/28/2019

## 2019-03-30 LAB — MULTIPLE MYELOMA PANEL, SERUM
Albumin SerPl Elph-Mcnc: 3.8 g/dL (ref 2.9–4.4)
Albumin/Glob SerPl: 1.2 (ref 0.7–1.7)
Alpha 1: 0.2 g/dL (ref 0.0–0.4)
Alpha2 Glob SerPl Elph-Mcnc: 0.9 g/dL (ref 0.4–1.0)
B-Globulin SerPl Elph-Mcnc: 0.9 g/dL (ref 0.7–1.3)
Gamma Glob SerPl Elph-Mcnc: 1.4 g/dL (ref 0.4–1.8)
Globulin, Total: 3.3 g/dL (ref 2.2–3.9)
IgA: 343 mg/dL (ref 90–386)
IgG (Immunoglobin G), Serum: 1567 mg/dL (ref 603–1613)
IgM (Immunoglobulin M), Srm: 38 mg/dL (ref 20–172)
Total Protein ELP: 7.1 g/dL (ref 6.0–8.5)

## 2019-03-30 MED FILL — POTASSIUM CL ER 20 MEQ TAB: 20 | 30 days supply | Qty: 30 | Fill #1

## 2019-04-05 ENCOUNTER — Telehealth: Payer: Self-pay | Admitting: Gastroenterology

## 2019-04-05 NOTE — Telephone Encounter (Signed)

## 2019-04-06 ENCOUNTER — Ambulatory Visit (AMBULATORY_SURGERY_CENTER): Payer: Medicaid Other | Admitting: Gastroenterology

## 2019-04-06 ENCOUNTER — Other Ambulatory Visit: Payer: Self-pay

## 2019-04-06 ENCOUNTER — Encounter: Payer: Self-pay | Admitting: Gastroenterology

## 2019-04-06 VITALS — BP 133/84 | HR 66 | Temp 98.1°F | Resp 22 | Ht 73.0 in | Wt 225.0 lb

## 2019-04-06 DIAGNOSIS — Z1210 Encounter for screening for malignant neoplasm of intestinal tract, unspecified: Secondary | ICD-10-CM | POA: Diagnosis not present

## 2019-04-06 DIAGNOSIS — D122 Benign neoplasm of ascending colon: Secondary | ICD-10-CM

## 2019-04-06 DIAGNOSIS — K635 Polyp of colon: Secondary | ICD-10-CM | POA: Diagnosis not present

## 2019-04-06 DIAGNOSIS — Z1211 Encounter for screening for malignant neoplasm of colon: Secondary | ICD-10-CM | POA: Diagnosis not present

## 2019-04-06 DIAGNOSIS — Z86010 Personal history of colonic polyps: Secondary | ICD-10-CM | POA: Diagnosis not present

## 2019-04-06 DIAGNOSIS — G4733 Obstructive sleep apnea (adult) (pediatric): Secondary | ICD-10-CM | POA: Diagnosis not present

## 2019-04-06 MED ORDER — SODIUM CHLORIDE 0.9 % IV SOLN: 500.0000 mL | Freq: Once | INTRAVENOUS | Status: DC

## 2019-04-06 NOTE — Progress Notes (Signed)
Report given to PACU, vss 

## 2019-04-06 NOTE — Progress Notes (Signed)
Called to room to assist during endoscopic procedure.  Patient ID and intended procedure confirmed with present staff. Received instructions for my participation in the procedure from the performing physician.  

## 2019-04-06 NOTE — Patient Instructions (Signed)
YOU HAD AN ENDOSCOPIC PROCEDURE TODAY AT THE [facility]:   Refer to the procedure report that was given to you for any specific questions about what was found during the examination.  If the procedure report does not answer your questions, please call your gastroenterologist to clarify.  If you requested that your care partner not be given the details of your procedure findings, then the procedure report has been included in a sealed envelope for you to review at your convenience later.  **Handout given on polyps**   YOU SHOULD EXPECT: Some feelings of bloating in the abdomen. Passage of more gas than usual.  Walking can help get rid of the air that was put into your GI tract during the procedure and reduce the bloating. If you had a lower endoscopy (such as a colonoscopy or flexible sigmoidoscopy) you may notice spotting of blood in your stool or on the toilet paper. If you underwent a bowel prep for your procedure, you may not have a normal bowel movement for a few days.  Please Note:  You might notice some irritation and congestion in your nose or some drainage.  This is from the oxygen used during your procedure.  There is no need for concern and it should clear up in a day or so.  SYMPTOMS TO REPORT IMMEDIATELY:   Following lower endoscopy (colonoscopy or flexible sigmoidoscopy):  Excessive amounts of blood in the stool  Significant tenderness or worsening of abdominal pains  Swelling of the abdomen that is new, acute  Fever of 100F or higher   For urgent or emergent issues, a gastroenterologist can be reached at any hour by calling [phone].   DIET:  We do recommend a small meal at first, but then you may proceed to your regular diet.  Drink plenty of fluids but you should avoid alcoholic beverages for 24 hours.  ACTIVITY:  You should plan to take it easy for the rest of today and you should NOT DRIVE or use heavy machinery until tomorrow (because of the sedation  medicines used during the test).    FOLLOW UP: Our staff will call the number listed on your records 48-72 hours following your procedure to check on you and address any questions or concerns that you may have regarding the information given to you following your procedure. If we do not reach you, we will leave a message.  We will attempt to reach you two times.  During this call, we will ask if you have developed any symptoms of COVID 19. If you develop any symptoms (ie: fever, flu-like symptoms, shortness of breath, cough etc.) before then, please call [phone].  If you test positive for Covid 19 in the 2 weeks post procedure, please call and report this information to us.    If any biopsies were taken you will be contacted by phone or by letter within the next 1-3 weeks.  Please call us at [phone] if you have not heard about the biopsies in 3 weeks.    SIGNATURES/CONFIDENTIALITY: You and/or your care partner have signed paperwork which will be entered into your electronic medical record.  These signatures attest to the fact that that the information above on your After Visit Summary has been reviewed and is understood.  Full responsibility of the confidentiality of this discharge information lies with you and/or your care-partner. 

## 2019-04-06 NOTE — Op Note (Signed)
[facility] Patient Name: Daniel Joseph Procedure Date: 04/06/2019 8:54 AM MRN: [id number] Endoscopist: Grayling. Loletha Carrow , MD Age: 58 Referring MD:  Date of Birth: 1961/02/08 Gender: Male Account #: [id number] Procedure:                Colonoscopy Indications:              Screening for colorectal malignant neoplasm (poor                            prep on 10/2017 screening colonoscopy) Medicines:                Monitored Anesthesia Care Procedure:                Pre-Anesthesia Assessment:                           - Prior to the procedure, a History and Physical                            was performed, and patient medications and                            allergies were reviewed. The patient's tolerance of                            previous anesthesia was also reviewed. The risks                            and benefits of the procedure and the sedation                            options and risks were discussed with the patient.                            All questions were answered, and informed consent                            was obtained. Prior Anticoagulants: The patient has                            taken no previous anticoagulant or antiplatelet                            agents except for aspirin. ASA Grade Assessment:                            III - A patient with severe systemic disease. After                            reviewing the risks and benefits, the patient was                            deemed in satisfactory condition to undergo the  procedure.                           After obtaining informed consent, the colonoscope                            was passed under direct vision. Throughout the                            procedure, the patient's blood pressure, pulse, and                            oxygen saturations were monitored continuously. The                            Colonoscope was introduced through the anus and                            advanced to the the cecum, identified by                            appendiceal orifice and ileocecal valve. The                            colonoscopy was performed without difficulty. The                            patient tolerated the procedure well. The quality                            of the bowel preparation was good. The ileocecal                            valve, appendiceal orifice, and rectum were                            photographed. The bowel preparation used was 2 day                            Suprep/Miralax via split dose instruction. Scope In: 9:06:59 AM Scope Out: 9:18:17 AM Scope Withdrawal Time: 0 hours 9 minutes 9 seconds  Total Procedure Duration: 0 hours 11 minutes 18 seconds  Findings:                 The perianal exam findings include prolapsed                            internal hemorrhoids and poor sphincter tone.                           A few small-mouthed diverticula were found in the                            sigmoid colon.  A 4 mm polyp was found in the ascending colon. The                            polyp was sessile. The polyp was removed with a                            cold snare. Resection and retrieval were complete.                           Retroflexion in the rectum was not performed due to                            narrow anatomy.                           The exam was otherwise without abnormality. Complications:            No immediate complications. Estimated Blood Loss:     Estimated blood loss was minimal. Impression:               - Internal hemorrhoids                           - Diverticulosis in the sigmoid colon.                           - One 4 mm polyp in the ascending colon, removed                            with a cold snare. Resected and retrieved.                           - The examination was otherwise normal. Recommendation:           - Patient has a contact number  available for                            emergencies. The signs and symptoms of potential                            delayed complications were discussed with the                            patient. Return to normal activities tomorrow.                            Written discharge instructions were provided to the                            patient.                           - Resume previous diet.                           - Continue present medications.                           -  Await pathology results.                           - Repeat colonoscopy is recommended for                            surveillance. The colonoscopy date will be                            determined after pathology results from today's                            exam become available for review.  L. Loletha Carrow, MD 04/06/2019 9:23:21 AM This report has been signed electronically.

## 2019-04-06 NOTE — Progress Notes (Signed)
  Pt's states no medical or surgical changes since previsit or office visit.  Rica Mote Vitals and [facility] CMA temps. SM

## 2019-04-08 ENCOUNTER — Ambulatory Visit (HOSPITAL_COMMUNITY)
Admission: RE | Admit: 2019-04-08 | Discharge: 2019-04-08 | Disposition: A | Discharge status: Home / Self Care | Payer: Medicaid Other | Source: Ambulatory Visit | Attending: Internal Medicine | Admitting: Internal Medicine

## 2019-04-08 ENCOUNTER — Telehealth: Payer: Self-pay

## 2019-04-08 ENCOUNTER — Encounter: Payer: Self-pay | Admitting: *Deleted

## 2019-04-08 ENCOUNTER — Other Ambulatory Visit: Payer: Self-pay

## 2019-04-08 DIAGNOSIS — I5042 Chronic combined systolic (congestive) and diastolic (congestive) heart failure: Secondary | ICD-10-CM | POA: Insufficient documentation

## 2019-04-08 LAB — BASIC METABOLIC PANEL
Anion gap: 8 (ref 5–15)
BUN: 8 mg/dL (ref 6–20)
CO2: 24 mmol/L (ref 22–32)
Calcium: 8.8 mg/dL — ABNORMAL LOW (ref 8.9–10.3)
Chloride: 106 mmol/L (ref 98–111)
Creatinine, Ser: 1.43 mg/dL — ABNORMAL HIGH (ref 0.61–1.24)
GFR calc Af Amer: >60 mL/min (ref >60)
GFR calc non Af Amer: 55 mL/min — ABNORMAL LOW (ref >60)
Glucose, Bld: 71 mg/dL (ref 70–99)
Potassium: 3.6 mmol/L (ref 3.5–5.1)
Sodium: 138 mmol/L (ref 135–145)

## 2019-04-08 NOTE — Telephone Encounter (Signed)
  Follow up Call-  Call back number 04/06/2019 11/12/2017  Post procedure Call Back phone  # [phone]  Permission to leave phone message Yes Yes  Some recent data might be hidden     Patient questions:  Do you have a fever, pain , or abdominal swelling? No. Pain Score  0 *  Have you tolerated food without any problems? Yes.    Have you been able to return to your normal activities? Yes.    Do you have any questions about your discharge instructions: Diet   No. Medications  No. Follow up visit  No.  Do you have questions or concerns about your Care? No.  Actions: * If pain score is 4 or above: No action needed, pain <4.  1. Have you developed a fever since your procedure? no  2.   Have you had an respiratory symptoms (SOB or cough) since your procedure? no  3.   Have you tested positive for COVID 19 since your procedure no  4.   Have you had any family members/close contacts diagnosed with the COVID 19 since your procedure?  no   If yes to any of these questions please route to Joylene John, RN and Alphonsa Gin, Therapist, sports.

## 2019-04-08 NOTE — Progress Notes (Signed)
Patient was seen in office for a follow up blood pressure check and lab draw. Blood pressure was 130/86. Patient denies any symptoms and states overall he feels pretty well.

## 2019-04-12 ENCOUNTER — Encounter: Payer: Self-pay | Admitting: Gastroenterology

## 2019-04-21 ENCOUNTER — Telehealth (HOSPITAL_COMMUNITY): Payer: Self-pay | Admitting: Emergency Medicine

## 2019-04-21 NOTE — Telephone Encounter (Signed)
Reaching out to patient to offer assistance regarding upcoming cardiac imaging study; pt verbalizes understanding of appt date/time, parking situation and where to check in, and verified current allergies; name and call back number provided for further questions should they arise Alyric Parkin RN Navigator Cardiac Imaging [facility] [phone] office [phone] cell 

## 2019-04-22 ENCOUNTER — Ambulatory Visit (HOSPITAL_COMMUNITY): Admission: RE | Admit: 2019-04-22 | Payer: Medicaid Other | Source: Ambulatory Visit

## 2019-04-26 ENCOUNTER — Encounter (INDEPENDENT_AMBULATORY_CARE_PROVIDER_SITE_OTHER): Payer: Medicaid Other | Admitting: Cardiology

## 2019-04-26 DIAGNOSIS — I472 Ventricular tachycardia: Secondary | ICD-10-CM | POA: Diagnosis not present

## 2019-04-26 DIAGNOSIS — I5042 Chronic combined systolic (congestive) and diastolic (congestive) heart failure: Secondary | ICD-10-CM

## 2019-04-26 DIAGNOSIS — I4729 Other ventricular tachycardia: Secondary | ICD-10-CM

## 2019-04-27 ENCOUNTER — Encounter: Payer: Self-pay | Admitting: *Deleted

## 2019-05-02 NOTE — Procedures (Signed)
    Sleep Study Report Patient Information Name: Daniel Joseph  ID: [id number] Birth Date: 11-08-1960  Age: 58  Gender: Male Study Date:04/26/2019 Referring Physician :  Loralie Champagne MD  Summary & Diagnosis TEST DESCRIPTION: Home sleep apnea testing was completed using the WatchPat, a Type 1 device, utilizing peripheral arterial tonometry (PAT), chest movement, actigraphy, pulse oximetry, pulse rate, body position and snore.AHI was calculated with apnea and hypopnea using valid sleep time as the denominator. RDI includes apneas,hypopneas, and RERAs. The data acquired and the scoring of sleep and all associated events were performed in accaordance with the recommended standards and specifications as outlined in the AASM Manual for the Scoring ofSleep and Associated Events 2.2.0 (2015).  DIAGNOSIS 1. Normal study with no significant sleep disordered breathing (AHI 1.6/hr). 2. No significant nocturnal hypoxemia. The lowest O2 sat was 80% and time spent with O2 sats < 88% was 0. 3. The patient slept for a total of 4 hours and 59 minutes. 4. Normal sleep onset latency and short REM sleep onset latency. 5. Moderate snoring was noted.  Recommendations 1. Normal study with no significant sleep disordered breathing.  2. An ENT consultation which may be useful for specific causes of and possible treatment of bothersome snoring .  3. Weight loss may be of benefit in reducing the severity of snoring. 3   Report prepared by: Signature: Fransico Him Electronically Signed: May 02, 2019

## 2019-05-03 ENCOUNTER — Encounter: Payer: Self-pay | Admitting: *Deleted

## 2019-05-03 ENCOUNTER — Ambulatory Visit: Payer: Medicaid Other

## 2019-05-03 ENCOUNTER — Telehealth: Payer: Self-pay | Admitting: *Deleted

## 2019-05-03 ENCOUNTER — Other Ambulatory Visit: Payer: Self-pay

## 2019-05-03 NOTE — Telephone Encounter (Signed)
Informed patient of sleep study results and patient understanding was verbalized. Patient understands his sleep study showed no significant sleep apnea.   Pt is aware and agreeable to normal results. 

## 2019-05-03 NOTE — Telephone Encounter (Signed)
This encounter was created in error - please disregard.

## 2019-05-03 NOTE — Telephone Encounter (Signed)
-----   Message from Sueanne Margarita, MD sent at 05/02/2019  6:15 PM EDT ----- Please let patient know that sleep study showed no significant sleep apnea.

## 2019-05-03 NOTE — Telephone Encounter (Signed)
-----   Message from Daniel Margarita, MD sent at 05/02/2019  6:15 PM EDT ----- Please let patient know that sleep study showed no significant sleep apnea.

## 2019-05-06 ENCOUNTER — Ambulatory Visit: Payer: Medicaid Other | Admitting: Family Medicine

## 2019-05-06 NOTE — Progress Notes (Deleted)
Cardiology Office Note:    Date:  05/06/2019   ID:  Daniel Joseph, DOB 02/01/1961, MRN [id number]  PCP:  Lanae Boast, FNP  Cardiologist:  Fransico Him, MD *** Electrophysiologist:  None   Referring MD: Lanae Boast, FNP   No chief complaint on file. ***  History of Present Illness:    Daniel Joseph is a 58 y.o. 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 was last seen in 01/2019.  He was referred to the CHF Clinic for pulmonary hypertension.  He underwent a TEE and R/L heart cath with Dr. [name].  TEE showed EF 50%, mod MR.  R/L cardiac catheterization showed mildly elevated PCWP, pulmonary venous hypertension and non-obstructive coronary artery disease.  A recent PYP scan was equivocal and cMRI is pending.  PFTs and a VQ scan are also pending.  Multiple myeloma panel was negative.  Sleep study in 04/2009 showed no sleep apnea.  He was last seen in the HF Clinic 03/26/2019.  ***  Prior CV studies:   The following studies were reviewed today:  TEE 03/16/2019 EF 50, mild LVH, diff HK, normal RVSF, mod LAE, no LAA clot, 0.6 x 0.8 cm mass attached to atrial side of TV (probl fibroelastoma), mild TR, peak to peak RV-RA gradient 50 mmHg, neg bubble study, mild MVP, mod MR   R/L Cardiac catheterization 03/16/2019 LAD mid 50, dist 40 RI irregs LCx irregs RCA prox 30, mid 30  RA mean 4 RV 54/6 PA 55/21, mean 34 PCWP mean 17 LV 150/14  Oxygen saturations: PA 75% AO 99%  Cardiac Output (Fick) 6.02  Cardiac Index (Fick) 2.66 PVR 2.83 WU 1. Normal RA pressure, mildly elevated PCWP.  2. Moderate pulmonary hypertension, PVR not significantly elevated at 2.83, suggesting primarily pulmonary venous hypertension.  3. Nonobstructive coronary disease.   Medical management of coronary disease.  Continue current diuretic regimen.  With moderate pulmonary hypertension, will get PFTs and V/Q scan, need to make sure he has had a sleep study.    Tc71mPyrophosphate Scan 01/20/2019 This study is equivocal for TTR amyloidosis, consider cardiac MRI for further evaluation.    Echo 01/20/2019 EF 40-45, mild LVH, Gr 3 DD, normal RVSF, poss severe MR, mod TR, mobile 1 cm x 1 cm TV nodule, PASP 79   Echocardiogram 10/23/2018 EF 50-55, mild LVH, severe LAE, mild RAE, mild mitral valve prolapse, mild MAC, moderate MR, mobile density on tricuspid valve (possible fibroblastoma), moderate pulmonary hypertension    Echocardiogram 11/03/2017 EF 50-55, possible inferior and inferoseptal HK, mild late anterior leaflet systolic prolapse, mild to moderate MR, severe LAE, moderate RAE   Renal artery ultrasound 02/21/2015 Normal caliber abdominal aorta, normal renal arteries bilaterally   Myoview 01/17/2015 Conclusion:   There is a small, moderately severe defect in  the inferoapical region at stress and rest. 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 The SDS = 3. There is no reversible ischemia.  There is moderate global hypokinesis of the LV   TEE 09/14/2014 EF 40-45, normal wall motion, mild to moderate MR, smooth spherical mass on the atrial side of tricuspid valve leaflet attached broadbase most consistent with myxoma or fibroblastoma, mild TR   Renal Artery [place] 6/15 Normal abdominal  aorta.  Normal renal arteries bilaterally.  Past Medical History:  Diagnosis Date  . Benign essential HTN 01/19/2014   Renal Artery [place] 6/16:  No RAS, No AAA  . Cardiomyopathy, dilated, nonischemic (HCC)    EF 45-50% bye echo 08/2015 and EF 50-55% by echo 10/2017  . Chronic combined systolic and diastolic CHF (congestive heart failure) ([place]) 01/21/2014  . 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 Gout   . Mitral valve regurgitation    mild to moderate MR with MVP of ANMVL by echo 2019  . PVC's (premature ventricular contractions)    nonsustained VT as well as PVCs - no ICD indicated due to EF 40-45%  . Sleep apnea    pending second sleep study   . 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   Current Medications: No outpatient medications have been marked as taking for the 05/07/19 encounter (Appointment) with Richardson Dopp T, PA-C.   Current Facility-Administered Medications for the 05/07/19 encounter (Appointment) with Liliane Shi, PA-C  Medication  . 0.9 %  sodium chloride infusion     Allergies:   Patient has no known allergies.   Social History   Tobacco Use  . Smoking status: Never Smoker  . Smokeless tobacco: Never Used  Substance Use Topics  . Alcohol use: No  . Drug use: No     Family Hx: The patient's family history includes Heart Problems in his brother, father, and mother; Heart attack in his brother and father; Heart failure in his mother; Hypertension in his brother, father, and  mother; Stroke in his mother. There is no history of Colon cancer, Esophageal cancer, Stomach cancer, Colon polyps, or Rectal cancer.  ROS:   Please see the history of present illness.    ROS All other systems reviewed and are negative.   EKGs/Labs/Other Test Reviewed:    EKG:  EKG is *** ordered today.  The ekg ordered today demonstrates ***  Recent Labs: 11/11/2018: NT-Pro BNP 564 03/12/2019: Platelets 156 03/16/2019: Hemoglobin 15.3; Hemoglobin 14.6 04/08/2019: BUN 8; Creatinine, Ser 1.43; Potassium 3.6; Sodium 138   Recent Lipid Panel Lab Results  Component Value Date/Time   CHOL 145 09/02/2018 09:50 AM   TRIG 82 09/02/2018 09:50 AM   HDL 54 09/02/2018 09:50 AM   CHOLHDL 2.7 09/02/2018 09:50 AM   CHOLHDL 3.5 08/13/2017 08:48 AM   LDLCALC 75 09/02/2018 09:50 AM   LDLCALC 94 08/13/2017 08:48 AM    Physical Exam:    VS:  There were no vitals taken for this visit.    Wt Readings from Last 3 Encounters:  04/08/19 224 lb (  101.6 kg)  04/06/19 225 lb (102.1 kg)  03/26/19 224 lb (101.6 kg)     ***Physical Exam  ASSESSMENT & PLAN:    *** Chronic combined systolic and diastolic CHF (congestive heart failure) ([place]) EF by echo 01/20/19 is 40-45 with severe diastolic dysfunction.  Overall, his volume status seems to be stable.  Continue beta-blocker, hydralazine, nitrates, ACE inhibitor, spironolactone.  Pulmonary hypertension, unspecified ([place]) PASP 79 by recent echocardiogram.  Currently, he does not have significant symptoms.  He has an evaluation with the heart failure clinic June 26.  I reviewed his case today with Dr. [name].  Given his equivocal PYP scan, we will go ahead and obtain a cardiac MRI with and without contrast prior to his visit in June with the congestive heart failure clinic.  Nonrheumatic mitral valve regurgitation Echo in 01/2019 demonstrated possible severe mitral regurgitation.  As noted, he has an evaluation with the heart failure clinic next month.  He  may need a transesophageal echocardiogram in addition to his right heart catheterization for pulmonary hypertension.  As noted, he is currently without significant symptoms.  Tricuspid valve mass This is been documented in the past and appears to be stable.  CKD (chronic kidney disease), stage III (HCC) Recent creatinine 1.23.  He should be able to tolerate contrast for cardiac MRI.  Nonsustained paroxysmal ventricular tachycardia ([place]) Evaluated by EP in the past.  Continue beta-blocker.   Dispo:  No follow-ups on file.   Medication Adjustments/Labs and Tests Ordered: Current medicines are reviewed at length with the patient today.  Concerns regarding medicines are outlined above.  Tests Ordered: No orders of the defined types were placed in this encounter.  Medication Changes: No orders of the defined types were placed in this encounter.   Signed, Richardson Dopp, PA-C  05/06/2019 2:17 PM    [facility] [address], [place], [place]  [address] Phone: [phone]; Fax: [phone]

## 2019-05-07 ENCOUNTER — Ambulatory Visit: Payer: Medicaid Other | Admitting: Physician Assistant

## 2019-05-10 ENCOUNTER — Encounter (HOSPITAL_COMMUNITY): Payer: Medicaid Other

## 2019-05-12 ENCOUNTER — Other Ambulatory Visit: Payer: Self-pay | Admitting: Family Medicine

## 2019-05-12 MED FILL — DOXAZOSIN MESYLATE 4 MG TAB: 4 | 30 days supply | Qty: 45 | Fill #1

## 2019-05-13 MED FILL — ISOSORBIDE MN ER 30 MG TAB: 30 | 30 days supply | Qty: 30 | Fill #0

## 2019-05-14 ENCOUNTER — Telehealth (HOSPITAL_COMMUNITY): Payer: Self-pay | Admitting: Emergency Medicine

## 2019-05-14 NOTE — Telephone Encounter (Signed)
Left message on voicemail with name and callback number Jamal Haskin RN Navigator Cardiac Imaging [facility] [phone] Office [phone] Cell  

## 2019-05-17 ENCOUNTER — Ambulatory Visit (HOSPITAL_COMMUNITY)
Admission: RE | Admit: 2019-05-17 | Discharge: 2019-05-17 | Disposition: A | Discharge status: Home / Self Care | Payer: Medicaid Other | Source: Ambulatory Visit | Attending: Physician Assistant | Admitting: Physician Assistant

## 2019-05-17 ENCOUNTER — Ambulatory Visit (HOSPITAL_COMMUNITY): Admission: RE | Admit: 2019-05-17 | Payer: Medicaid Other | Source: Ambulatory Visit

## 2019-05-17 ENCOUNTER — Other Ambulatory Visit: Payer: Self-pay

## 2019-05-17 DIAGNOSIS — I5042 Chronic combined systolic (congestive) and diastolic (congestive) heart failure: Secondary | ICD-10-CM | POA: Diagnosis not present

## 2019-05-17 DIAGNOSIS — I272 Pulmonary hypertension, unspecified: Secondary | ICD-10-CM | POA: Diagnosis not present

## 2019-05-17 MED ORDER — GADOBUTROL 1 MMOL/ML IV SOLN
10.0000 mL | Freq: Once | INTRAVENOUS | Status: AC | PRN
  Administered 2019-05-17: 10 mL via INTRAVENOUS

## 2019-05-18 ENCOUNTER — Encounter: Payer: Self-pay | Admitting: Physician Assistant

## 2019-05-18 ENCOUNTER — Telehealth (INDEPENDENT_AMBULATORY_CARE_PROVIDER_SITE_OTHER): Payer: Medicaid Other | Admitting: Physician Assistant

## 2019-05-18 VITALS — BP 149/90 | Ht 73.0 in | Wt 225.0 lb

## 2019-05-18 DIAGNOSIS — I5042 Chronic combined systolic (congestive) and diastolic (congestive) heart failure: Secondary | ICD-10-CM | POA: Diagnosis not present

## 2019-05-18 DIAGNOSIS — I2510 Atherosclerotic heart disease of native coronary artery without angina pectoris: Secondary | ICD-10-CM

## 2019-05-18 DIAGNOSIS — N183 Chronic kidney disease, stage 3 unspecified: Secondary | ICD-10-CM

## 2019-05-18 DIAGNOSIS — M25462 Effusion, left knee: Secondary | ICD-10-CM

## 2019-05-18 DIAGNOSIS — I10 Essential (primary) hypertension: Secondary | ICD-10-CM

## 2019-05-18 DIAGNOSIS — M25562 Pain in left knee: Secondary | ICD-10-CM

## 2019-05-18 MED ORDER — ISOSORBIDE MONONITRATE ER 60 MG PO TB24: 60.0000 mg | ORAL_TABLET | Freq: Every day | ORAL | 3 refills | Status: DC

## 2019-05-18 NOTE — Patient Instructions (Signed)
Medication Instructions:  Increase Imdur (Isosorbide) to 60 mg once daily You can take 2 of the 30 mg tablets until they are gone.  They new prescription is for 60 mg tablets.  If you need a refill on your cardiac medications before your next appointment, please call your pharmacy.   Lab work: None   If you have labs (blood work) drawn today and your tests are completely normal, you will receive your results only by: Marland Kitchen MyChart Message (if you have MyChart) OR . A paper copy in the mail If you have any lab test that is abnormal or we need to change your treatment, we will call you to review the results.  Testing/Procedures: None   Follow-Up: At [facility], you and your health needs are our priority.  As part of our continuing mission to provide you with exceptional heart care, we have created designated Provider Care Teams.  These Care Teams include your primary Cardiologist (physician) and Advanced Practice Providers (APPs -  Physician Assistants and Nurse Practitioners) who all work together to provide you with the care you need, when you need it. . Dr. [name] as scheduled in the [facility]  . Dr. [name] in 6 mos   Any Other Special Instructions Will Be Listed Below (If Applicable).

## 2019-05-18 NOTE — Progress Notes (Signed)
Virtual Visit via Telephone Note   This visit type was conducted due to national recommendations for restrictions regarding the COVID-19 Pandemic (e.g. social distancing) in an effort to limit this patient's exposure and mitigate transmission in our community.  Due to his co-morbid illnesses, this patient is at least at moderate risk for complications without adequate follow up.  This format is felt to be most appropriate for this patient at this time.  The patient did not have access to video technology/had technical difficulties with video requiring transitioning to audio format only (telephone).  All issues noted in this document were discussed and addressed.  No physical exam could be performed with this format.  Please refer to the patient's chart for his  consent to telehealth for [facility].   Date:  05/18/2019   ID:  Daniel Joseph, DOB 1960/12/10, MRN [id number]  Patient Location: Home Provider Location: Home  PCP:  Daniel Joseph, Daniel Joseph  Cardiologist:  Daniel Him, MD   Electrophysiologist:  None  Advanced [facility] Failure Clinic:  Daniel Champagne, MD     Evaluation Performed:  Follow-Up Visit  Chief Complaint: CHF, HTN  History of Present Illness:    Daniel Joseph is a 58 y.o. 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 was last seen in 01/2019.  He was referred to the CHF Clinic for pulmonary hypertension.  He underwent a TEE and R/L heart cath with Daniel Joseph.  TEE showed EF 50%, mod MR.  R/L cardiac catheterization showed mildly elevated PCWP, pulmonary venous hypertension and non-obstructive coronary artery disease.  A recent PYP scan was equivocal.  Cardiac MRI, done yesterday, demonstrated no evidence of amyloid.  There was fibrosis suggestive of hypertensive heart disease.  There was also LGE concerning for cardiac sarcoid.  PFTs and a VQ scan are pending.  Multiple myeloma panel was negative.  Sleep study in 04/2009 showed no sleep apnea.  He was last seen in the HF Clinic 03/26/2019.    Today, he notes he is doing well.  He did recently have left knee pain with associated swelling.  He increased his Lasix for a few days with some improvement.  He is not had any chest discomfort or pedal edema.  He notes his breathing is improved.  He has not had syncope.  The patient does not have symptoms concerning for COVID-19 infection (fever, chills, cough, or new shortness of breath).    Past Medical History:  Diagnosis Date  . Benign essential HTN 01/19/2014   Renal Artery [place] 6/16:  No RAS, No AAA  . Cardiomyopathy, dilated, nonischemic (HCC)    EF 45-50% bye echo 08/2015 and EF 50-55%  by echo 10/2017  . Chronic combined systolic and diastolic CHF (congestive heart failure) ([place]) 01/21/2014  . 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 Gout   . Mitral valve regurgitation    mild to moderate MR with MVP of ANMVL by echo 2019  . PVC's (premature ventricular contractions)    nonsustained VT  as well as PVCs - no ICD indicated due to EF 40-45%  . Sleep apnea    pending second sleep study   . 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 ABDOMINAL SURGERY  at birth  . BUBBLE STUDY  03/16/2019   Procedure: BUBBLE STUDY;  Surgeon: Larey Dresser, MD;  Location: [facility] ENDOSCOPY;  Service: Cardiovascular;;  . RIGHT/LEFT HEART CATH AND CORONARY ANGIOGRAPHY N/A 03/16/2019   Procedure: RIGHT/LEFT HEART CATH AND CORONARY ANGIOGRAPHY;  Surgeon: Larey Dresser, MD;  Location: [place] CV LAB;  Service: Cardiovascular;  Laterality: N/A;  . TEE WITHOUT CARDIOVERSION N/A 01/25/2014   Procedure: TRANSESOPHAGEAL ECHOCARDIOGRAM (TEE);  Surgeon: [name], MD;  Location: [place];  Service: Cardiovascular;  Laterality: N/A;  . TEE WITHOUT CARDIOVERSION N/A 09/14/2014   Procedure: TRANSESOPHAGEAL ECHOCARDIOGRAM (TEE);  Surgeon: Sueanne Margarita, MD;  Location: [place];  Service: Cardiovascular;  Laterality: N/A;  . TEE WITHOUT CARDIOVERSION N/A 03/16/2019   Procedure: TRANSESOPHAGEAL ECHOCARDIOGRAM (TEE);  Surgeon: Larey Dresser, MD;  Location: [facility] ENDOSCOPY;  Service: Cardiovascular;  Laterality: N/A;     Current Meds  Medication Sig  . acetaminophen (TYLENOL) 650 MG CR tablet Take 1,300 mg by mouth every 8 (eight) hours as needed for pain.  Marland Kitchen allopurinol (ZYLOPRIM) 100 MG tablet Take 1 tablet (100 mg total) by mouth 2 (two) times daily.  Marland Kitchen amLODipine (NORVASC) 10 MG tablet Take 1 tablet (10 mg total) by mouth daily.  Marland Kitchen aspirin EC 81 MG tablet Take 1 tablet (81 mg total) by mouth daily.  . benazepril (LOTENSIN) 40 MG tablet Take 1 tablet (40 mg total) by mouth daily.  . bisacodyl (DULCOLAX) 5 MG EC tablet Take 1 tablet (5 mg total) by mouth daily as needed for moderate constipation.  . carvedilol (COREG) 12.5 MG tablet Take 1.5 tablets (18.75 mg total) by mouth 2 (two) times daily.  . cetirizine (ZYRTEC) 10 MG tablet  Take 1 tablet (10 mg total) by mouth daily.  . cloNIDine (CATAPRES) 0.2 MG tablet Take 1 tablet (0.2 mg total) by mouth daily.  Marland Kitchen doxazosin (CARDURA) 4 MG tablet Take 1.5 tablets (6 mg total) by mouth at bedtime.  . furosemide (LASIX) 40 MG tablet Take 40 mg by mouth 2 (two) times daily.  Marland Kitchen gabapentin (NEURONTIN) 300 MG capsule Take 1 capsule (300 mg total) by mouth 3 (three) times daily.  . hydrALAZINE (APRESOLINE) 50 MG tablet Take 100 mg by mouth 3 (three) times daily.  . ondansetron (ZOFRAN ODT) 8 MG disintegrating tablet Take 1 tablet (8 mg total) by mouth every 8 (eight) hours as needed for nausea or vomiting.  . polyethylene glycol powder (GLYCOLAX/MIRALAX) 17 GM/SCOOP powder Take 255 g by mouth as directed.  . potassium chloride SA (K-DUR) 20 MEQ tablet Take 1 tablet (20 mEq total) by mouth daily.  . rosuvastatin (CRESTOR) 10 MG tablet Take 1 tablet (10 mg total) by  mouth daily.  Marland Kitchen spironolactone (ALDACTONE) 50 MG tablet Take 1 tablet (50 mg total) by mouth daily.  . tizanidine (ZANAFLEX) 2 MG capsule Take 2 mg by mouth 3 (three) times daily.  . traMADol (ULTRAM) 50 MG tablet Take 50 mg by mouth every 8 (eight) hours as needed for moderate pain.   Marland Kitchen triamcinolone cream (KENALOG) 0.1 % Apply 1 application topically 2 (two) times daily as needed (Rash).   . [DISCONTINUED] isosorbide mononitrate (IMDUR) 30 MG 24 hr tablet TAKE 1 TABLET (30 MG TOTAL) BY MOUTH DAILY.   Current Facility-Administered Medications for the 05/18/19 encounter (Telemedicine) with Richardson Dopp T, PA-C  Medication  . 0.9 %  sodium chloride infusion     Allergies:   Patient has no known allergies.   Social History   Tobacco Use  . Smoking status: Never Smoker  . Smokeless tobacco: Never Used  Substance Use Topics  . Alcohol use: No  . Drug use: No     Family Hx: The patient's family history includes Heart Problems in his brother, father, and mother; Heart attack in his brother and father; Heart failure in  his mother; Hypertension in his brother, father, and mother; Stroke in his mother. There is no history of Colon cancer, Esophageal cancer, Stomach cancer, Colon polyps, or Rectal cancer.  ROS:   Please see the history of present illness.     All other systems reviewed and are negative.   Prior CV studies:   The following studies were reviewed today:   Cardiac MRI 05/17/2019 IMPRESSION: 1. No evidence of cardiac amyloid. Extracellular volume calculated at 30%, which is elevated and suggestive of diffuse fibrosis likely due to hypertensive heart disease, but is not in the range of amyloid (>35%)  2. There is dense basal inferoseptal/inferior midwall late gadolinium enhancement. Concerning for cardiac sarcoid. Prior myocarditis also on differential. LGE appears more subendocardial in the basal to mid inferior wall, which could suggest a small infarct. However, given nonobstructive CAD seen on recent cath, and considering sarcoid can sometimes present with an MI scar pattern, suspect sarcoid more likely  3. Inferior RV insertion site LGE, which can be seen in setting of elevated pulmonary pressures  4. Moderate LV dilatation, mild hypertrophy, and low normal systolic function (EF 16%). There is focal basal to mid inferior hypokinesis  5.  Mild RV dilatation with normal systolic function (EF 10%)  6. Eccentric posterior directed mitral regurgitation. Degree of regurgitation was not quantified, but visually appears moderate  7. Severe left atrial enlargement. Moderate right atrial enlargement.   TEE 03/16/2019 EF 50, mild LVH, diff HK, normal RVSF, mod LAE, no LAA clot, 0.6 x 0.8 cm mass attached to atrial side of TV (probl fibroelastoma), mild TR, peak to peak RV-RA gradient 50 mmHg, neg bubble study, mild MVP, mod MR  R/L Cardiac catheterization 03/16/2019 LAD mid 50, dist 40 RI irregs LCx irregs RCA prox 30, mid 30  RA mean 4 RV 54/6 PA 55/21, mean 34 PCWP mean 17  LV 150/14  Oxygen saturations: PA 75% AO 99%  Cardiac Output (Fick) 6.02  Cardiac Index (Fick) 2.66 PVR 2.83 WU 1. Normal RA pressure, mildly elevated PCWP.  2. Moderate pulmonary hypertension, PVR not significantly elevated at 2.83, suggesting primarily pulmonary venous hypertension.  3. Nonobstructive coronary disease.   Medical management of coronary disease.  Continue current diuretic regimen.  With moderate pulmonary hypertension, will get PFTs and V/Q scan, need to make sure he has had a sleep study.  Tc71mPyrophosphate Scan 01/20/2019 This study is equivocal for TTR amyloidosis, consider cardiac MRI for further evaluation.    Echo 01/20/2019 EF 40-45, mild LVH, Gr 3 DD, normal RVSF, poss severe MR, mod TR, mobile 1 cm x 1 cm TV nodule, PASP 79   Echocardiogram 10/23/2018 EF 50-55, mild LVH, severe LAE, mild RAE, mild mitral valve prolapse, mild MAC, moderate MR, mobile density on tricuspid valve (possible fibroblastoma), moderate pulmonary hypertension    Echocardiogram 11/03/2017 EF 50-55, possible inferior and inferoseptal HK, mild late anterior leaflet systolic prolapse, mild to moderate MR, severe LAE, moderate RAE   Renal artery ultrasound 02/21/2015 Normal caliber abdominal aorta, normal renal arteries bilaterally   Myoview 01/17/2015 Conclusion:   There is a small, moderately severe defect in the inferoapical region at stress and rest. 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 The SDS = 3. There is no reversible ischemia.  There is moderate global hypokinesis of the LV   TEE 09/14/2014 EF 40-45, normal wall motion, mild to moderate MR, smooth spherical mass on the atrial side of tricuspid valve leaflet attached broadbase most consistent with myxoma or fibroblastoma, mild TR   Renal Artery [place]6/15 Normal abdominal aorta.  Normal renal arteries bilaterally.  Labs/Other Tests and Data Reviewed:    EKG:  No ECG reviewed.  Recent Labs: 11/11/2018: NT-Pro BNP 564 03/12/2019:  Platelets 156 03/16/2019: Hemoglobin 15.3; Hemoglobin 14.6 04/08/2019: BUN 8; Creatinine, Ser 1.43; Potassium 3.6; Sodium 138   Recent Lipid Panel Lab Results  Component Value Date/Time   CHOL 145 09/02/2018 09:50 AM   TRIG 82 09/02/2018 09:50 AM   HDL 54 09/02/2018 09:50 AM   CHOLHDL 2.7 09/02/2018 09:50 AM   CHOLHDL 3.5 08/13/2017 08:48 AM   LDLCALC 75 09/02/2018 09:50 AM   LDLCALC 94 08/13/2017 08:48 AM    Wt Readings from Last 3 Encounters:  05/18/19 225 lb (102.1 kg)  04/08/19 224 lb (101.6 kg)  04/06/19 225 lb (102.1 kg)     Objective:    Vital Signs:  BP (!) 149/90   Ht _0  (1.854 m)   Wt 225 lb (102.1 kg)   BMI 29.69 kg/m    VITAL SIGNS:  reviewed GEN:  no acute distress RESPIRATORY:  No labored breathing NEURO:  Alert and oriented PSYCH:  Normal mood  ASSESSMENT & PLAN:    1. Chronic combined systolic and diastolic CHF (congestive heart failure) ([place] EF 53 by recent cardiac MRI.  He has primarily diastolic heart failure.  Right heart catheterization has demonstrated pulmonary venous hypertension.  PFTs and VQ scan are pending.  Recent cardiac MRI has findings suggestive of hypertensive heart disease as well as questionable cardiac sarcoid.  I reviewed the findings on his cardiac MRI with Joseph today.  I have also explained to Joseph that Dr. MAundra Dubinwill also review the study.  Volume status currently seems to be stable.  Continue ACE inhibitor, beta-blocker, hydralazine, nitrates, spironolactone.  2. CKD (chronic kidney disease), stage III (HCC) Recent creatinine stable.  3. Essential hypertension Blood pressure still above target.  Increase isosorbide to 60 mg daily.  Keep follow-up with CHF clinic in 2 weeks as planned.  4. Coronary artery disease involving native coronary artery of native heart without angina pectoris Moderate nonobstructive coronary disease.  Continue statin therapy.  5. Pain and swelling of left knee He may be describing pseudogout.  If he  truly has sarcoidosis, he may have a musculoskeletal manifestation.  I have asked Joseph to follow-up with  primary care.   Time:   Today, I have spent 12 minutes with the patient with telehealth technology discussing the above problems.     Medication Adjustments/Labs and Tests Ordered: Current medicines are reviewed at length with the patient today.  Concerns regarding medicines are outlined above.   Tests Ordered: No orders of the defined types were placed in this encounter.   Medication Changes: Meds ordered this encounter  Medications  . isosorbide mononitrate (IMDUR) 60 MG 24 hr tablet    Sig: Take 1 tablet (60 mg total) by mouth daily.    Dispense:  90 tablet    Refill:  3    Dose increase    Order Specific Question:   Supervising Provider    Answer:   Lelon Perla [[id number]]    Follow Up:  In Person Daniel Joseph in [place], Richardson Dopp, PA-C  05/18/2019 4:06 PM    [place]

## 2019-05-19 ENCOUNTER — Other Ambulatory Visit: Payer: Self-pay | Admitting: *Deleted

## 2019-05-19 ENCOUNTER — Telehealth: Payer: Self-pay | Admitting: Physician Assistant

## 2019-05-19 DIAGNOSIS — D869 Sarcoidosis, unspecified: Secondary | ICD-10-CM

## 2019-05-19 NOTE — Telephone Encounter (Signed)
Returned call to patient to inform results have no been signed off by Dr [name] just yet, will return call to patient once they have been reviewed.

## 2019-05-19 NOTE — Telephone Encounter (Signed)
° ° °  Please return call with MR Card Morphology results

## 2019-05-19 NOTE — Telephone Encounter (Signed)
The cardiac MRI may be suggestive of cardiac sarcoidosis.  He needs high resolution CT chest (no contrast) to assess for evidence for pulmonary sarcoidosis.

## 2019-05-20 NOTE — Telephone Encounter (Signed)
This was addressed in another phone encounter Patient is scheduled for CT chest on 06/14/19

## 2019-05-26 ENCOUNTER — Encounter (HOSPITAL_COMMUNITY): Payer: Self-pay | Admitting: *Deleted

## 2019-05-26 ENCOUNTER — Encounter (HOSPITAL_COMMUNITY): Payer: Self-pay

## 2019-05-27 ENCOUNTER — Other Ambulatory Visit: Payer: Self-pay

## 2019-05-27 ENCOUNTER — Ambulatory Visit (HOSPITAL_COMMUNITY)
Admission: RE | Admit: 2019-05-27 | Discharge: 2019-05-27 | Disposition: A | Discharge status: Home / Self Care | Payer: Medicaid Other | Source: Ambulatory Visit | Attending: Internal Medicine | Admitting: Internal Medicine

## 2019-05-27 ENCOUNTER — Ambulatory Visit (HOSPITAL_BASED_OUTPATIENT_CLINIC_OR_DEPARTMENT_OTHER)
Admission: RE | Admit: 2019-05-27 | Discharge: 2019-05-27 | Disposition: A | Discharge status: Home / Self Care | Payer: Medicaid Other | Source: Ambulatory Visit | Attending: Internal Medicine | Admitting: Internal Medicine

## 2019-05-27 ENCOUNTER — Encounter (HOSPITAL_COMMUNITY): Payer: Self-pay

## 2019-05-27 VITALS — BP 138/84 | HR 83 | Wt 227.8 lb

## 2019-05-27 DIAGNOSIS — Z7982 Long term (current) use of aspirin: Secondary | ICD-10-CM | POA: Insufficient documentation

## 2019-05-27 DIAGNOSIS — M109 Gout, unspecified: Secondary | ICD-10-CM

## 2019-05-27 DIAGNOSIS — Z79899 Other long term (current) drug therapy: Secondary | ICD-10-CM | POA: Insufficient documentation

## 2019-05-27 DIAGNOSIS — N183 Chronic kidney disease, stage 3 unspecified: Secondary | ICD-10-CM

## 2019-05-27 DIAGNOSIS — M25562 Pain in left knee: Secondary | ICD-10-CM

## 2019-05-27 DIAGNOSIS — M25462 Effusion, left knee: Secondary | ICD-10-CM | POA: Diagnosis not present

## 2019-05-27 DIAGNOSIS — D869 Sarcoidosis, unspecified: Secondary | ICD-10-CM | POA: Diagnosis not present

## 2019-05-27 DIAGNOSIS — I340 Nonrheumatic mitral (valve) insufficiency: Secondary | ICD-10-CM | POA: Insufficient documentation

## 2019-05-27 DIAGNOSIS — I110 Hypertensive heart disease with heart failure: Secondary | ICD-10-CM | POA: Insufficient documentation

## 2019-05-27 DIAGNOSIS — Z8249 Family history of ischemic heart disease and other diseases of the circulatory system: Secondary | ICD-10-CM | POA: Diagnosis not present

## 2019-05-27 DIAGNOSIS — I5042 Chronic combined systolic (congestive) and diastolic (congestive) heart failure: Secondary | ICD-10-CM

## 2019-05-27 DIAGNOSIS — I10 Essential (primary) hypertension: Secondary | ICD-10-CM

## 2019-05-27 DIAGNOSIS — I2510 Atherosclerotic heart disease of native coronary artery without angina pectoris: Secondary | ICD-10-CM | POA: Insufficient documentation

## 2019-05-27 DIAGNOSIS — I341 Nonrheumatic mitral (valve) prolapse: Secondary | ICD-10-CM | POA: Insufficient documentation

## 2019-05-27 DIAGNOSIS — I428 Other cardiomyopathies: Secondary | ICD-10-CM | POA: Insufficient documentation

## 2019-05-27 LAB — URIC ACID: Uric Acid, Serum: 7.5 mg/dL (ref 3.7–8.6)

## 2019-05-27 LAB — HEPATIC FUNCTION PANEL
ALT: 49 U/L — ABNORMAL HIGH (ref 0–44)
AST: 43 U/L — ABNORMAL HIGH (ref 15–41)
Albumin: 3.8 g/dL (ref 3.5–5.0)
Alkaline Phosphatase: 61 U/L (ref 38–126)
Bilirubin, Direct: 0.1 mg/dL (ref 0.0–0.2)
Indirect Bilirubin: 0.5 mg/dL (ref 0.3–0.9)
Total Bilirubin: 0.6 mg/dL (ref 0.3–1.2)
Total Protein: 7.3 g/dL (ref 6.5–8.1)

## 2019-05-27 LAB — LIPID PANEL
Cholesterol: 150 mg/dL (ref 0–200)
HDL: 68 mg/dL (ref >40)
LDL Cholesterol: 67 mg/dL (ref 0–99)
Total CHOL/HDL Ratio: 2.2 RATIO
Triglycerides: 75 mg/dL (ref <150)
VLDL: 15 mg/dL (ref 0–40)

## 2019-05-27 MED ORDER — PREDNISONE 10 MG PO TABS: 40.0000 mg | ORAL_TABLET | Freq: Every day | ORAL | 0 refills | Status: AC

## 2019-05-27 MED ORDER — CARVEDILOL 25 MG PO TABS: 25.0000 mg | ORAL_TABLET | Freq: Two times a day (BID) | ORAL | 3 refills | Status: DC

## 2019-05-27 MED FILL — predniSONE 10 MG TABS: 10 | 3 days supply | Qty: 12 | Fill #0

## 2019-05-27 MED FILL — CARVEDILOL 25 MG TABLET: 25 | 30 days supply | Qty: 60 | Fill #0

## 2019-05-27 NOTE — Progress Notes (Signed)
PCP: Lanae Boast, [place] Cardiology: Dr. [name] HF Cardiology: Dr. [name]  58 y.o. 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 [name] for evaluation of CHF. 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  Cardiolite in 5/16 showed no ischemia but possible apical infarction.  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  PYP scan was read as equivocal in 5/20 but probably not suggestive of transthyretin amyloidosis.   TEE was done in 6/20 to assess severity of MR.  EF was 50% with mild mitral valve prolapse and moderate MR.  RHC/LHC showed nonobstructive CAD, pulmonary venous hypertension, relatively controlled filling pressures.  Had cardiac MRI 04/2019 and this was concerning for sarcoid. He has been set up for High resolution CT.   Today he returns for HF follow up. Last visit coreg was increased to 18.75 mg twice a day and spiro was increased to 50 mg daily. Overall feeling ok but having left knee pain. Started having left knee pain about 7 days ago. He doesn't know what caused the pain. SOB with steps. No real change. Denies PND/Orthopnea. Appetite ok. No fever or chills. Weight at home 225-227 pounds. Taking all medications.  Labs (5/20): K 3.5, creatinine 1.23 Labs (6/20): Urine immunofixation negative, K 4.2, creatinine 1.36 Labs (04/08/19): K 3.6 Creatinine 1.43   PMH: 1. Gout 2. H/o NSVT/PVCs 3. 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  Seen on multiple echoes, first in 2015.  4. HTN: Renal artery dopplers in 6/16 were unremarkable. Sleep study negative 2020.  5. Mitral regurgitation: TEE in 2015 with mild to moderate MR.  - Echo (2/20): Moderate MR.  - Echo (5/20): Possible severe MR - TEE (6/20): Mild mitral valve prolapse with moderate MR.  6. 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  - Cardiolite (5/16): Possible apical infarct, no ischemia.  - Echo (7/19): EF 50-55%.  - Echo (2/20): EF 50-55%, moderate MR, TV fibroelastoma.  - PYP scan (5/20): Grade 1 visually, H/CL 1.23.  Read as equivocal but likely negative for TTR amyloidosis.  - Echo (5/20): EF 40-45%, mildly dilated LV with mild LVH, normal RV size and systolic function, possible severe MR, moderate TR, 1 cm nodule on TV may be fibroelastoma, PASP 79 mmHg.  - TEE (6/20): EF 50%, mild LV dilation, mild LVH, moderate LAE, tricuspid valve mass likely fibroelastoma, mild MVP with moderate MR.  - LHC/RHC (6/20): 50% LAD, 30% pRCA; mean RA 4, PA 55/21 mean 34, mean PCWP 17, CI 2.66, PVR 2.8 WU.  - 04/2019 CMRI concerning for sarcoidosis   FH:  Father with MI at 8, brother with MI at 91, mother with CHF, MI.  HTN in multiple family members.   Social History   Socioeconomic History  . Marital status: Single    Spouse name: Not on file  . Number of children: Not on file  . Years of education: Not on file  . Highest education level: Not on file  Occupational History  . Not on file  Social Needs  . Financial resource strain: Not on file  . Food insecurity    Worry: Not on file    Inability: Not on file  . Transportation needs    Medical: Not on file    Non-medical: Not on file  Tobacco Use  .  Smoking status: Never Smoker  . Smokeless tobacco: Never Used  Substance and Sexual Activity  . Alcohol use: No  . Drug use: No  . Sexual activity: Not on file  Lifestyle  . Physical activity    Days per week: Not on file    Minutes per session: Not on file  . Stress: Not on file  Relationships  . Social Herbalist on phone: Not on file    Gets together: Not on file    Attends religious service: Not on file    Active member of club or organization: Not on file    Attends meetings of clubs or  organizations: Not on file    Relationship status: Not on file  . Intimate partner violence    Fear of current or ex partner: Not on file    Emotionally abused: Not on file    Physically abused: Not on file    Forced sexual activity: Not on file  Other Topics Concern  . Not on file  Social History Narrative  . Not on file   ROS: All systems reviewed and negative except as per HPI.   Current Outpatient Medications  Medication Sig Dispense Refill  . acetaminophen (TYLENOL) 650 MG CR tablet Take 1,300 mg by mouth every 8 (eight) hours as needed for pain.    Marland Kitchen allopurinol (ZYLOPRIM) 100 MG tablet Take 1 tablet (100 mg total) by mouth 2 (two) times daily. 60 tablet 5  . amLODipine (NORVASC) 10 MG tablet Take 1 tablet (10 mg total) by mouth daily. 30 tablet 4  . aspirin EC 81 MG tablet Take 1 tablet (81 mg total) by mouth daily. 90 tablet 3  . benazepril (LOTENSIN) 40 MG tablet Take 1 tablet (40 mg total) by mouth daily. 30 tablet 6  . bisacodyl (DULCOLAX) 5 MG EC tablet Take 1 tablet (5 mg total) by mouth daily as needed for moderate constipation. 4 tablet 0  . carvedilol (COREG) 12.5 MG tablet Take 1.5 tablets (18.75 mg total) by mouth 2 (two) times daily. 90 tablet 5  . cetirizine (ZYRTEC) 10 MG tablet Take 1 tablet (10 mg total) by mouth daily. 30 tablet 11  . cloNIDine (CATAPRES) 0.2 MG tablet Take 1 tablet (0.2 mg total) by mouth daily. 90 tablet 3  . doxazosin (CARDURA) 4 MG tablet Take 1.5 tablets (6 mg total) by mouth at bedtime. 45 tablet 2  . furosemide (LASIX) 40 MG tablet Take 40 mg by mouth 2 (two) times daily.    Marland Kitchen gabapentin (NEURONTIN) 300 MG capsule Take 1 capsule (300 mg total) by mouth 3 (three) times daily. 90 capsule 3  . hydrALAZINE (APRESOLINE) 50 MG tablet Take 100 mg by mouth 3 (three) times daily.    . isosorbide mononitrate (IMDUR) 60 MG 24 hr tablet Take 1 tablet (60 mg total) by mouth daily. 90 tablet 3  . ondansetron (ZOFRAN ODT) 8 MG disintegrating tablet Take  1 tablet (8 mg total) by mouth every 8 (eight) hours as needed for nausea or vomiting. 20 tablet 0  . polyethylene glycol powder (GLYCOLAX/MIRALAX) 17 GM/SCOOP powder Take 255 g by mouth as directed. 119 g 0  . potassium chloride SA (K-DUR) 20 MEQ tablet Take 1 tablet (20 mEq total) by mouth daily. 90 tablet 3  . rosuvastatin (CRESTOR) 10 MG tablet Take 1 tablet (10 mg total) by mouth daily. 90 tablet 3  . spironolactone (ALDACTONE) 50 MG tablet Take 1 tablet (50 mg total) by  mouth daily. 30 tablet 11  . tizanidine (ZANAFLEX) 2 MG capsule Take 2 mg by mouth 3 (three) times daily.    . traMADol (ULTRAM) 50 MG tablet Take 50 mg by mouth every 8 (eight) hours as needed for moderate pain.     Marland Kitchen triamcinolone cream (KENALOG) 0.1 % Apply 1 application topically 2 (two) times daily as needed (Rash).      Current Facility-Administered Medications  Medication Dose Route Frequency Provider Last Rate Last Dose  . 0.9 %  sodium chloride infusion  500 mL Intravenous Once Nelida Meuse III, MD       BP 138/84   Pulse 83   Wt 103.3 kg (227 lb 12.8 oz)   SpO2 96%   BMI 30.05 kg/m  Wt Readings from Last 3 Encounters:  05/27/19 103.3 kg (227 lb 12.8 oz)  05/18/19 102.1 kg (225 lb)  04/08/19 101.6 kg (224 lb)    General:  Well appearing. No resp difficulty HEENT: normal Neck: supple. no JVD. Carotids 2+ bilat; no bruits. No lymphadenopathy or thryomegaly appreciated. Cor: PMI nondisplaced. Regular rate & rhythm. No rubs, gallops or murmurs. Lungs: clear Abdomen: soft, nontender, nondistended. No hepatosplenomegaly. No bruits or masses. Good bowel sounds. Extremities: no cyanosis, clubbing, rash, edema. L knee hot to touch. No erythema.  Neuro: alert & orientedx3, cranial nerves grossly intact. moves all 4 extremities w/o difficulty. Affect pleasant  Assessment/Plan: 1. 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  Echo in 5/20 was reviewed, EF  40-45% with normal RV, possible severe mitral regurgitation, and severe pulmonary hypertension.  PYP scan in 5/20 was read as equivocal, but I suspect this is not suggestive of transthyretin amyloidosis. TEE in 5/20 showed EF 50%, mild LV dilation, moderate MR.  RHC/LHC showed mildly elevated PCWP, pulmonary venous hypertension, and nonobstructive CAD.  - NYHA II-III. Volume status stable. Current Lasix dose appears adequate.  - Continue benazepril 40 mg daily and hydralazine/Imdur.  - Increase coreg to 25 mg twice a day.  - Continue spironolactone to 50 mg daily.   - CMRI concerning for sarcoidosis. Negative for amyloid.  - myeloma panel negative.  2. Mitral regurgitation: Prominent murmur, ?severe on last TTE but TEE in 6/20 showed mild mitral valve prolapse with only moderate MR.   - Continue to work to control BP as this will likely help limit MR.   - See above increase BB.  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  4. HTN:   Negative renal artery dopplers and sleep study in past.  On multiple agents.   Better control today. Increase coreg as noted above.  Sleep study negative for sleep apnea  5. 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  -Continue Crestor 10 mg daily. Lipids/LFTs in 2 months.  6. 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 Set up for High resolution CT to assess for sarcoid.  7. Gout Check uric acid level . On allupurinol . Give steroid burst. If not improvement will need f/u with PCP.   Follow up in 8 weeks with Dr [name].  Zaven Klemens  NP-C  05/27/2019

## 2019-05-27 NOTE — Patient Instructions (Addendum)
INCREASE Coreg to 25 mg, one tab twice a day START Prednisone 40 mg daily for 3 days  Labs today We will only contact you if something comes back abnormal or we need to make some changes. Otherwise no news is good news!  Your physician has requested that you have cardiac CT. Cardiac computed tomography (CT) is a painless test that uses an x-ray machine to take clear, detailed pictures of your heart. For further information please visit [email or web address]. Please follow instruction sheet as given. 06/14/2019 @ 9:00am  Your physician recommends that you schedule a follow-up appointment in: 8 weeks with Dr [name]  Do the following things EVERYDAY: 1) Weigh yourself in the morning before breakfast. Write it down and keep it in a log. 2) Take your medicines as prescribed 3) Eat low salt foods-Limit salt (sodium) to 2000 mg per day.  4) Stay as active as you can everyday 5) Limit all fluids for the day to less than 2 liters   At the [facility], you and your health needs are our priority. As part of our continuing mission to provide you with exceptional heart care, we have created designated Provider Care Teams. These Care Teams include your primary Cardiologist (physician) and Advanced Practice Providers (APPs- Physician Assistants and Nurse Practitioners) who all work together to provide you with the care you need, when you need it.   You may see any of the following providers on your designated Care Team at your next follow up: Marland Kitchen Dr Glori Bickers . Dr Loralie Champagne . Darrick Grinder, NP   Please be sure to bring in all your medications bottles to every appointment.

## 2019-05-28 ENCOUNTER — Telehealth (HOSPITAL_COMMUNITY): Payer: Self-pay

## 2019-05-28 DIAGNOSIS — R7989 Other specified abnormal findings of blood chemistry: Secondary | ICD-10-CM

## 2019-05-28 NOTE — Telephone Encounter (Signed)
Pt aware of lab results. He is scheduled to have labs drawn at [address] on 9/28. Labs added to that visit. Pt aware of same. Note added to appointment notes

## 2019-05-28 NOTE — Telephone Encounter (Signed)
-----   Message from Larey Dresser, MD sent at 05/27/2019  9:35 PM EDT ----- Excellent lipids.  Slight AST/ALT elevation, hard to know what to make of this.  Would repeat LFTs in 2 wks.

## 2019-06-07 ENCOUNTER — Other Ambulatory Visit: Payer: Self-pay | Admitting: Family Medicine

## 2019-06-07 DIAGNOSIS — T7840XD Allergy, unspecified, subsequent encounter: Secondary | ICD-10-CM

## 2019-06-14 ENCOUNTER — Other Ambulatory Visit: Payer: Medicaid Other

## 2019-06-14 ENCOUNTER — Inpatient Hospital Stay: Admission: RE | Admit: 2019-06-14 | Payer: Medicaid Other | Source: Ambulatory Visit

## 2019-06-16 ENCOUNTER — Other Ambulatory Visit: Payer: Medicaid Other

## 2019-07-19 ENCOUNTER — Other Ambulatory Visit: Payer: Self-pay

## 2019-07-19 ENCOUNTER — Other Ambulatory Visit: Payer: Medicaid Other | Admitting: *Deleted

## 2019-07-19 DIAGNOSIS — R7989 Other specified abnormal findings of blood chemistry: Secondary | ICD-10-CM

## 2019-07-19 LAB — HEPATIC FUNCTION PANEL
ALT: 44 IU/L (ref 0–44)
AST: 35 IU/L (ref 0–40)
Albumin: 3.9 g/dL (ref 3.8–4.9)
Alkaline Phosphatase: 67 IU/L (ref 39–117)
Bilirubin Total: 0.7 mg/dL (ref 0.0–1.2)
Bilirubin, Direct: 0.25 mg/dL (ref 0.00–0.40)
Total Protein: 6.4 g/dL (ref 6.0–8.5)

## 2019-07-20 ENCOUNTER — Ambulatory Visit (HOSPITAL_COMMUNITY)
Admission: RE | Admit: 2019-07-20 | Discharge: 2019-07-20 | Disposition: A | Discharge status: Home / Self Care | Payer: Medicaid Other | Source: Ambulatory Visit | Attending: Cardiology | Admitting: Cardiology

## 2019-07-20 ENCOUNTER — Encounter (HOSPITAL_COMMUNITY): Payer: Self-pay | Admitting: Cardiology

## 2019-07-20 VITALS — BP 160/110 | HR 73 | Wt 228.2 lb

## 2019-07-20 DIAGNOSIS — I110 Hypertensive heart disease with heart failure: Secondary | ICD-10-CM | POA: Insufficient documentation

## 2019-07-20 DIAGNOSIS — M109 Gout, unspecified: Secondary | ICD-10-CM | POA: Diagnosis not present

## 2019-07-20 DIAGNOSIS — Z7901 Long term (current) use of anticoagulants: Secondary | ICD-10-CM | POA: Insufficient documentation

## 2019-07-20 DIAGNOSIS — I428 Other cardiomyopathies: Secondary | ICD-10-CM | POA: Diagnosis not present

## 2019-07-20 DIAGNOSIS — I5042 Chronic combined systolic (congestive) and diastolic (congestive) heart failure: Secondary | ICD-10-CM

## 2019-07-20 DIAGNOSIS — I340 Nonrheumatic mitral (valve) insufficiency: Secondary | ICD-10-CM | POA: Insufficient documentation

## 2019-07-20 DIAGNOSIS — Z7982 Long term (current) use of aspirin: Secondary | ICD-10-CM | POA: Insufficient documentation

## 2019-07-20 DIAGNOSIS — Z79899 Other long term (current) drug therapy: Secondary | ICD-10-CM | POA: Diagnosis not present

## 2019-07-20 DIAGNOSIS — I341 Nonrheumatic mitral (valve) prolapse: Secondary | ICD-10-CM | POA: Insufficient documentation

## 2019-07-20 DIAGNOSIS — I2510 Atherosclerotic heart disease of native coronary artery without angina pectoris: Secondary | ICD-10-CM | POA: Insufficient documentation

## 2019-07-20 DIAGNOSIS — Z8249 Family history of ischemic heart disease and other diseases of the circulatory system: Secondary | ICD-10-CM | POA: Insufficient documentation

## 2019-07-20 DIAGNOSIS — D869 Sarcoidosis, unspecified: Secondary | ICD-10-CM | POA: Insufficient documentation

## 2019-07-20 LAB — BASIC METABOLIC PANEL
Anion gap: 11 (ref 5–15)
BUN: 11 mg/dL (ref 6–20)
CO2: 23 mmol/L (ref 22–32)
Calcium: 8.9 mg/dL (ref 8.9–10.3)
Chloride: 103 mmol/L (ref 98–111)
Creatinine, Ser: 1.27 mg/dL — ABNORMAL HIGH (ref 0.61–1.24)
GFR calc Af Amer: >60 mL/min (ref >60)
GFR calc non Af Amer: >60 mL/min (ref >60)
Glucose, Bld: 91 mg/dL (ref 70–99)
Potassium: 3.9 mmol/L (ref 3.5–5.1)
Sodium: 137 mmol/L (ref 135–145)

## 2019-07-20 MED ORDER — CLONIDINE HCL 0.2 MG PO TABS: 0.2000 mg | ORAL_TABLET | Freq: Two times a day (BID) | ORAL | 3 refills | Status: DC

## 2019-07-20 NOTE — Patient Instructions (Signed)
INCREASE Clonidine to 0.2mg  (1 tab) TWICE A DAY  Labs today We will only contact you if something comes back abnormal or we need to make some changes. Otherwise no news is good news!  You are ordered for a CT Scan of your chest.  You will get a call to schedule this appointment.   Your physician recommends that you schedule a follow-up appointment in: 2 weeks with Pharmacist and 6 weeks with Dr [name]  At the [facility], you and your health needs are our priority. As part of our continuing mission to provide you with exceptional heart care, we have created designated Provider Care Teams. These Care Teams include your primary Cardiologist (physician) and Advanced Practice Providers (APPs- Physician Assistants and Nurse Practitioners) who all work together to provide you with the care you need, when you need it.   You may see any of the following providers on your designated Care Team at your next follow up: Marland Kitchen Dr Glori Bickers . Dr Loralie Champagne . Darrick Grinder, NP . Lyda Jester, PA   Please be sure to bring in all your medications bottles to every appointment.

## 2019-07-20 NOTE — Progress Notes (Signed)
PCP: Lanae Boast, [place] Cardiology: Dr. [name] HF Cardiology: Dr. [name]  58 y.o. 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 [name] for evaluation of CHF. 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  Cardiolite in 5/16 showed no ischemia but possible apical infarction.  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  PYP scan was read as equivocal in 5/20 but probably not suggestive of transthyretin amyloidosis.   TEE was done in 6/20 to assess severity of MR.  EF was 50% with mild mitral valve prolapse and moderate MR.  RHC/LHC showed nonobstructive CAD, pulmonary venous hypertension, relatively controlled filling pressures. 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   Patient's BP remains poorly controlled despite multiple medications. It is very high here, but he says that at home it tends to range 150s/90s. He says he ate bacon this morning.  Renal artery dopplers were unremarkable in 6/16.   Patient presents for followup of CHF.  No significant exertional dyspnea.  No orthopnea/PND. No chest pain.  He has generally been trying to avoid sodium.   Labs (5/20): K 3.5, creatinine 1.23 Labs (6/20): Urine immunofixation negative, K 4.2, creatinine 1.36 Labs (7/20): creatinine 1.43 Labs (9/20): uric acid 7.5, LDL 67  PMH: 1. Gout 2. H/o NSVT/PVCs 3. 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  Seen on multiple echoes, first in 2015.  4. HTN: Renal artery dopplers in 6/16 were unremarkable. Sleep study about 2 years ago was negative per patient's report.  5. Mitral regurgitation: TEE in 2015 with mild to moderate MR.  - Echo (2/20): Moderate MR.  - Echo (5/20): Possible severe MR - TEE (6/20): Mild mitral valve prolapse  with moderate MR.  6. 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  - Cardiolite (5/16): Possible apical infarct, no ischemia.  - Echo (7/19): EF 50-55%.  - Echo (2/20): EF 50-55%, moderate MR, TV fibroelastoma.  - PYP scan (5/20): Grade 1 visually, H/CL 1.23.  Read as equivocal but likely negative for TTR amyloidosis.  - Echo (5/20): EF 40-45%, mildly dilated LV with mild LVH, normal RV size and systolic function, possible severe MR, moderate TR, 1 cm nodule on TV may be fibroelastoma, PASP 79 mmHg.  - TEE (6/20): EF 50%, mild LV dilation, mild LVH, moderate LAE, tricuspid valve mass likely fibroelastoma, mild MVP with moderate MR.  - LHC/RHC (6/20): 50% LAD, 30% pRCA; mean RA 4, PA 55/21 mean 34, mean PCWP 17, CI 2.66, PVR 2.8 WU.  - Cardiac MRI (8/20): EF 53%, mild LVH, inferoseptal/inferior mid-wall LGE (?cardiac sarcoidosis, does not look like amyloidosis), mild RV dilation with RVEF 54%, moderate eccentric MR, severe LAE.   FH:  Father with MI at 51, brother with MI at 84, mother with CHF, MI.  HTN in multiple family members.   Social History   Socioeconomic History  . Marital status: Single    Spouse name: Not on file  . Number of children: Not on file  . Years of education: Not on file  . Highest education level: Not on file  Occupational History  . Not on file  Social Needs  . Financial resource strain: Not on file  . Food insecurity    Worry: Not on file  Inability: Not on file  . Transportation needs    Medical: Not on file    Non-medical: Not on file  Tobacco Use  . Smoking status: Never Smoker  . Smokeless tobacco: Never Used  Substance and Sexual Activity  . Alcohol use: No  . Drug use: No  . Sexual activity: Not on file  Lifestyle  . Physical activity    Days per week: Not on file    Minutes per session: Not on file  . Stress: Not on file  Relationships  . Social Herbalist  on phone: Not on file    Gets together: Not on file    Attends religious service: Not on file    Active member of club or organization: Not on file    Attends meetings of clubs or organizations: Not on file    Relationship status: Not on file  . Intimate partner violence    Fear of current or ex partner: Not on file    Emotionally abused: Not on file    Physically abused: Not on file    Forced sexual activity: Not on file  Other Topics Concern  . Not on file  Social History Narrative  . Not on file   ROS: All systems reviewed and negative except as per HPI.   Current Outpatient Medications  Medication Sig Dispense Refill  . acetaminophen (TYLENOL) 650 MG CR tablet Take 1,300 mg by mouth every 8 (eight) hours as needed for pain.    Marland Kitchen allopurinol (ZYLOPRIM) 100 MG tablet Take 1 tablet (100 mg total) by mouth 2 (two) times daily. 60 tablet 5  . amLODipine (NORVASC) 10 MG tablet Take 1 tablet (10 mg total) by mouth daily. 30 tablet 4  . aspirin EC 81 MG tablet Take 1 tablet (81 mg total) by mouth daily. 90 tablet 3  . benazepril (LOTENSIN) 40 MG tablet Take 1 tablet (40 mg total) by mouth daily. 30 tablet 6  . bisacodyl (DULCOLAX) 5 MG EC tablet Take 1 tablet (5 mg total) by mouth daily as needed for moderate constipation. 4 tablet 0  . carvedilol (COREG) 25 MG tablet Take 1 tablet (25 mg total) by mouth 2 (two) times daily. 60 tablet 3  . cetirizine (ZYRTEC) 10 MG tablet TAKE 1 TABLET (10 MG TOTAL) BY MOUTH DAILY. 30 tablet 11  . cloNIDine (CATAPRES) 0.2 MG tablet Take 1 tablet (0.2 mg total) by mouth 2 (two) times daily. 180 tablet 3  . doxazosin (CARDURA) 4 MG tablet TAKE 1.5 TABLETS (6 MG TOTAL) BY MOUTH AT BEDTIME. 45 tablet 2  . furosemide (LASIX) 40 MG tablet Take 40 mg by mouth 2 (two) times daily.    Marland Kitchen gabapentin (NEURONTIN) 300 MG capsule Take 1 capsule (300 mg total) by mouth 3 (three) times daily. 90 capsule 3  . hydrALAZINE (APRESOLINE) 50 MG tablet Take 100 mg by mouth 3  (three) times daily.    . isosorbide mononitrate (IMDUR) 60 MG 24 hr tablet Take 1 tablet (60 mg total) by mouth daily. 90 tablet 3  . ondansetron (ZOFRAN ODT) 8 MG disintegrating tablet Take 1 tablet (8 mg total) by mouth every 8 (eight) hours as needed for nausea or vomiting. 20 tablet 0  . polyethylene glycol powder (GLYCOLAX/MIRALAX) 17 GM/SCOOP powder Take 255 g by mouth as directed. 119 g 0  . potassium chloride SA (K-DUR) 20 MEQ tablet Take 1 tablet (20 mEq total) by mouth daily. 90 tablet 3  . rosuvastatin (  CRESTOR) 10 MG tablet Take 1 tablet (10 mg total) by mouth daily. 90 tablet 3  . spironolactone (ALDACTONE) 50 MG tablet Take 1 tablet (50 mg total) by mouth daily. 30 tablet 11  . tizanidine (ZANAFLEX) 2 MG capsule Take 2 mg by mouth 3 (three) times daily.    . traMADol (ULTRAM) 50 MG tablet Take 50 mg by mouth every 8 (eight) hours as needed for moderate pain.     Marland Kitchen triamcinolone cream (KENALOG) 0.1 % Apply 1 application topically 2 (two) times daily as needed (Rash).      Current Facility-Administered Medications  Medication Dose Route Frequency Provider Last Rate Last Dose  . 0.9 %  sodium chloride infusion  500 mL Intravenous Once Nelida Meuse III, MD       BP (!) 160/110   Pulse 73   Wt 103.5 kg (228 lb 3.2 oz)   SpO2 97%   BMI 30.11 kg/m  General: NAD Neck: No JVD, no thyromegaly or thyroid nodule.  Lungs: Clear to auscultation bilaterally with normal respiratory effort. CV: Nondisplaced PMI.  Heart regular S1/S2, +S4, 1/6 HSM apex.  No peripheral edema.  No carotid bruit.  Normal pedal pulses.  Abdomen: Soft, nontender, no hepatosplenomegaly, no distention.  Skin: Intact without lesions or rashes.  Neurologic: Alert and oriented x 3.  Psych: Normal affect. Extremities: No clubbing or cyanosis.  HEENT: Normal.   Assessment/Plan: 1. 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  Echo in 5/20 showed EF 40-45%  with normal RV, possible severe mitral regurgitation, and severe pulmonary hypertension.  PYP scan in 5/20 was read as equivocal, but I suspect this is not suggestive of transthyretin amyloidosis. TEE in 5/20 showed EF 50%, mild LV dilation, moderate MR.  RHC/LHC showed mildly elevated PCWP, pulmonary venous hypertension, and nonobstructive CAD.  Cardiac MRI in 8/20 showed EF 53% and had an LGE pattern that was suggestive of possible cardiac sarcoidosis (not amyloidosis).  On exam today, he is not volume overloaded.  NYHA class II symptoms.  - Current Lasix dose appears adequate. BMET today.  - Continue benazepril 40 mg daily and hydralazine/Imdur.  - Continue Coreg 25 mg bid and spironolactone to 50 mg daily.  - Given concern for possible cardiac sarcoidosis, I will arrange for high resolution CT to assess for pulmonary sarcoidosis and will also check ACE level.  If this is unremarkable, will see if we can get a cardiac PET done at [facility] to assess for cardiac sarcoidosis.  2. Mitral regurgitation: ?severe on last TTE but TEE in 6/20 showed mild mitral valve prolapse with only moderate MR.  Cardiac MRI also suggested moderate MR.  - Continue to work to control BP as this will likely help limit MR.   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  4. HTN: Poor control.  Negative renal artery dopplers and sleep study in past.  On multiple agents.  Quite high today still.  - Increase clonidine to 0.2 mg bid.   5. 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  - continue Crestor, good lipids in 9/20.    Followup 2 wks with HF pharmacist to assess BP control.  See me in 6 wks.   Loralie Champagne 07/20/2019

## 2019-07-21 ENCOUNTER — Telehealth (HOSPITAL_COMMUNITY): Payer: Self-pay | Admitting: Vascular Surgery

## 2019-07-21 LAB — ANGIOTENSIN CONVERTING ENZYME: Angiotensin-Converting Enzyme: 10 U/L — ABNORMAL LOW (ref 14–82)

## 2019-07-21 NOTE — Telephone Encounter (Signed)
Left pt message giving Ct appt 07/28/19 @ 3, Asked pt to call back to confirm  appt

## 2019-07-28 ENCOUNTER — Ambulatory Visit (HOSPITAL_COMMUNITY): Payer: Medicaid Other

## 2019-08-03 ENCOUNTER — Encounter (HOSPITAL_COMMUNITY): Payer: Self-pay

## 2019-08-03 ENCOUNTER — Other Ambulatory Visit: Payer: Self-pay

## 2019-08-03 ENCOUNTER — Ambulatory Visit (HOSPITAL_COMMUNITY)
Admission: EM | Admit: 2019-08-03 | Discharge: 2019-08-03 | Disposition: A | Discharge status: Home / Self Care | Payer: Medicaid Other | Attending: Urgent Care | Admitting: Urgent Care

## 2019-08-03 DIAGNOSIS — M25462 Effusion, left knee: Secondary | ICD-10-CM

## 2019-08-03 DIAGNOSIS — S86912A Strain of unspecified muscle(s) and tendon(s) at lower leg level, left leg, initial encounter: Secondary | ICD-10-CM | POA: Diagnosis not present

## 2019-08-03 DIAGNOSIS — N183 Chronic kidney disease, stage 3 unspecified: Secondary | ICD-10-CM

## 2019-08-03 DIAGNOSIS — M25562 Pain in left knee: Secondary | ICD-10-CM

## 2019-08-03 MED ORDER — PREDNISONE 10 MG PO TABS: 30.0000 mg | ORAL_TABLET | Freq: Every day | ORAL | 0 refills | Status: DC

## 2019-08-03 NOTE — ED Triage Notes (Signed)
Pt presents with left knee pain and swelling that is not injury related.

## 2019-08-03 NOTE — ED Provider Notes (Signed)
[place]   MRN: [id number] DOB: 19-Aug-1961  Subjective:   Daniel Joseph is a 58 y.o. male presenting for ~1 week history of left knee swelling and pain.  Symptoms started last week from slipping while he was raining heavily.  He has tried icing with some relief.  He also picked up a knee sleeve which is helped as well.  Has a history of gout, takes allopurinol consistently for this.  Has not taken anything else.  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   Current Facility-Administered Medications:  .  0.9 %  sodium chloride infusion, 500 mL, Intravenous, Once, Danis, Estill Cotta III, MD  Current Outpatient Medications:  .  acetaminophen (TYLENOL) 650 MG CR tablet, Take 1,300 mg by mouth every 8 (eight) hours as needed for pain., Disp: , Rfl:  .  allopurinol (ZYLOPRIM) 100 MG tablet, Take 1 tablet (100 mg total) by mouth 2 (two) times daily., Disp: 60 tablet, Rfl: 5 .  amLODipine (NORVASC) 10 MG tablet, Take 1 tablet (10 mg total) by mouth daily., Disp: 30 tablet, Rfl: 4 .  aspirin EC 81 MG tablet, Take 1 tablet (81 mg total) by mouth daily., Disp: 90 tablet, Rfl: 3 .  benazepril (LOTENSIN) 40 MG tablet, Take 1 tablet (40 mg total) by mouth daily., Disp: 30 tablet, Rfl: 6 .  bisacodyl (DULCOLAX) 5 MG EC tablet, Take 1 tablet (5 mg total) by mouth daily as needed for moderate constipation., Disp: 4 tablet, Rfl: 0 .  carvedilol (COREG) 25 MG tablet, Take 1 tablet (25 mg total) by mouth 2 (two) times daily., Disp: 60 tablet, Rfl: 3 .  cetirizine (ZYRTEC) 10 MG tablet, TAKE 1 TABLET (10 MG TOTAL) BY MOUTH DAILY., Disp: 30 tablet, Rfl: 11 .  cloNIDine (CATAPRES) 0.2 MG tablet, Take 1 tablet (0.2 mg total) by mouth 2 (two) times daily., Disp: 180 tablet, Rfl: 3 .  doxazosin (CARDURA) 4 MG tablet, TAKE 1.5 TABLETS (6 MG TOTAL) BY MOUTH AT BEDTIME., Disp: 45 tablet, Rfl: 2 .  furosemide (LASIX) 40 MG tablet, Take 40 mg by mouth 2 (two) times daily., Disp: , Rfl:  .   gabapentin (NEURONTIN) 300 MG capsule, Take 1 capsule (300 mg total) by mouth 3 (three) times daily., Disp: 90 capsule, Rfl: 3 .  hydrALAZINE (APRESOLINE) 50 MG tablet, Take 100 mg by mouth 3 (three) times daily., Disp: , Rfl:  .  isosorbide mononitrate (IMDUR) 60 MG 24 hr tablet, Take 1 tablet (60 mg total) by mouth daily., Disp: 90 tablet, Rfl: 3 .  ondansetron (ZOFRAN ODT) 8 MG disintegrating tablet, Take 1 tablet (8 mg total) by mouth every 8 (eight) hours as needed for nausea or vomiting., Disp: 20 tablet, Rfl: 0 .  polyethylene glycol powder (GLYCOLAX/MIRALAX) 17 GM/SCOOP powder, Take 255 g by mouth as directed., Disp: 119 g, Rfl: 0 .  potassium chloride SA (K-DUR) 20 MEQ tablet, Take 1 tablet (20 mEq total) by mouth daily., Disp: 90 tablet, Rfl: 3 .  rosuvastatin (CRESTOR) 10 MG tablet, Take 1 tablet (10 mg total) by mouth daily., Disp: 90 tablet, Rfl: 3 .  spironolactone (ALDACTONE) 50 MG tablet, Take 1 tablet (50 mg total) by mouth daily., Disp: 30 tablet, Rfl: 11 .  tizanidine (ZANAFLEX) 2 MG capsule, Take 2 mg by mouth 3 (three) times daily., Disp: , Rfl:  .  traMADol (ULTRAM) 50 MG tablet, Take 50 mg by mouth every 8 (eight) hours as needed for moderate pain. ,  Disp: , Rfl:  .  triamcinolone cream (KENALOG) 0.1 %, Apply 1 application topically 2 (two) times daily as needed (Rash). , Disp: , Rfl:    No Known Allergies  Past Medical History:  Diagnosis Date  . Benign essential HTN 01/19/2014   Renal Artery [place] 6/16:  No RAS, No AAA  . Cardiomyopathy, dilated, nonischemic (HCC)    EF 45-50% bye echo 08/2015 and EF 50-55% by echo 10/2017  . Chronic combined systolic and diastolic CHF (congestive heart failure) ([place]) 01/21/2014  . 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 Gout   . Mitral valve regurgitation    mild to moderate MR with  MVP of ANMVL by echo 2019  . PVC's (premature ventricular contractions)    nonsustained VT as well as PVCs - no ICD indicated due to EF 40-45%  . Sleep apnea    pending second sleep study   . 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 ABDOMINAL SURGERY  at birth  . BUBBLE STUDY  03/16/2019   Procedure: BUBBLE STUDY;  Surgeon: Larey Dresser, MD;  Location: [facility] ENDOSCOPY;  Service: Cardiovascular;;  . RIGHT/LEFT HEART CATH AND CORONARY ANGIOGRAPHY N/A 03/16/2019   Procedure: RIGHT/LEFT HEART CATH AND CORONARY ANGIOGRAPHY;  Surgeon: Larey Dresser, MD;  Location: [facility] CV LAB;  Service: Cardiovascular;  Laterality: N/A;  . TEE WITHOUT CARDIOVERSION N/A 01/25/2014   Procedure: TRANSESOPHAGEAL ECHOCARDIOGRAM (TEE);  Surgeon: [name], MD;  Location: [facility];  Service: Cardiovascular;  Laterality: N/A;  . TEE WITHOUT CARDIOVERSION N/A 09/14/2014   Procedure: TRANSESOPHAGEAL ECHOCARDIOGRAM (TEE);  Surgeon: Sueanne Margarita, MD;  Location: [facility];  Service: Cardiovascular;  Laterality: N/A;  . TEE WITHOUT CARDIOVERSION N/A 03/16/2019   Procedure: TRANSESOPHAGEAL ECHOCARDIOGRAM (TEE);  Surgeon: Larey Dresser, MD;  Location: [facility] ENDOSCOPY;  Service: Cardiovascular;  Laterality: N/A;    Family History  Problem Relation Age of Onset  . Heart Problems Mother   . Stroke Mother   . Heart failure Mother   . Hypertension Mother   . Heart Problems Father   . Heart attack Father   . Hypertension Father   . Heart Problems Brother   . Heart attack Brother   . Hypertension Brother   . Colon cancer Neg Hx   . Esophageal cancer Neg Hx   . Stomach cancer Neg Hx   . Colon polyps Neg Hx   . Rectal cancer Neg Hx     Social History   Tobacco Use  . Smoking status: Never Smoker  . Smokeless tobacco: Never Used  Substance Use Topics  . Alcohol use: No  . Drug use: No    ROS   Objective:   Vitals: BP  (!) 147/89 (BP Location: Right Arm)   Pulse 78   Temp (!) 97.3 F (36.3 C) (Oral)   Resp 17   SpO2 99%   Physical Exam Constitutional:      General: He is not in acute distress.    Appearance: Normal appearance. He is well-developed and normal weight. He is not ill-appearing, toxic-appearing or diaphoretic.  HENT:     Head: Normocephalic and atraumatic.     Right Ear: External ear normal.     Left Ear: External ear  normal.     Nose: Nose normal.     Mouth/Throat:     Mouth: Mucous membranes are moist.     Pharynx: Oropharynx is clear.  Eyes:     General: No scleral icterus.    Extraocular Movements: Extraocular movements intact.     Pupils: Pupils are equal, round, and reactive to light.  Cardiovascular:     Rate and Rhythm: Normal rate and regular rhythm.     Heart sounds: Normal heart sounds. No murmur. No friction rub. No gallop.   Pulmonary:     Effort: Pulmonary effort is normal. No respiratory distress.     Breath sounds: Normal breath sounds. No stridor. No wheezing, rhonchi or rales.  Musculoskeletal:     Left knee: He exhibits decreased range of motion (Full flexion and extension) and swelling. He exhibits no effusion, no ecchymosis, no deformity, no laceration, no erythema, normal alignment, normal patellar mobility and no bony tenderness. Tenderness found. Medial joint line tenderness noted. No lateral joint line and no patellar tendon tenderness noted.  Neurological:     Mental Status: He is alert and oriented to person, place, and time.  Psychiatric:        Mood and Affect: Mood normal.        Behavior: Behavior normal.        Thought Content: Thought content normal.      Assessment and Plan :   1. Strain of left knee, initial encounter   2. Pain and swelling of left knee   3. Stage 3 chronic kidney disease, unspecified whether stage 3a or 3b CKD     We will manage conservatively for a knee strain with rest, Ace wrap and icing.  We will have patient use a  short steroid course given his CKD stage III.  Monitor BP and recheck with PCP if it remains elevated.  Counseled patient on potential for adverse effects with medications prescribed/recommended today, ER and return-to-clinic precautions discussed, patient verbalized understanding.    Jaynee Eagles, PA-C 08/03/19 1003

## 2019-08-03 NOTE — Discharge Instructions (Addendum)
Use Ace wrap during the day, take prednisone to help with pain/swelling. Ice for 20 minutes just before bed time for the next 3-4 days.

## 2019-08-05 ENCOUNTER — Telehealth: Payer: Self-pay | Admitting: Cardiology

## 2019-08-05 NOTE — Telephone Encounter (Signed)
Called and spoke to patient. He is looking to get set up with a PCP. Number given for [facility] to help establish with PCP. Patient appreciated the call.

## 2019-08-05 NOTE — Telephone Encounter (Signed)
New message    Patient calling to request recommendation for a new PCP

## 2019-08-11 ENCOUNTER — Inpatient Hospital Stay (HOSPITAL_COMMUNITY)
Admission: RE | Admit: 2019-08-11 | Discharge: 2019-08-11 | Disposition: A | Discharge status: Home / Self Care | Payer: Medicaid Other | Source: Ambulatory Visit

## 2019-08-29 ENCOUNTER — Other Ambulatory Visit: Payer: Self-pay

## 2019-08-29 ENCOUNTER — Inpatient Hospital Stay (HOSPITAL_COMMUNITY)
Admission: EM | Admit: 2019-08-29 | Discharge: 2019-09-01 | DRG: 291 | Disposition: A | Discharge status: Home / Self Care | Payer: Medicaid Other | Attending: Internal Medicine | Admitting: Internal Medicine

## 2019-08-29 ENCOUNTER — Emergency Department (HOSPITAL_COMMUNITY): Payer: Medicaid Other

## 2019-08-29 ENCOUNTER — Encounter (HOSPITAL_COMMUNITY): Payer: Self-pay

## 2019-08-29 DIAGNOSIS — D6959 Other secondary thrombocytopenia: Secondary | ICD-10-CM | POA: Diagnosis present

## 2019-08-29 DIAGNOSIS — R079 Chest pain, unspecified: Secondary | ICD-10-CM | POA: Diagnosis not present

## 2019-08-29 DIAGNOSIS — I130 Hypertensive heart and chronic kidney disease with heart failure and stage 1 through stage 4 chronic kidney disease, or unspecified chronic kidney disease: Principal | ICD-10-CM | POA: Diagnosis present

## 2019-08-29 DIAGNOSIS — N183 Chronic kidney disease, stage 3 unspecified: Secondary | ICD-10-CM | POA: Diagnosis present

## 2019-08-29 DIAGNOSIS — J9600 Acute respiratory failure, unspecified whether with hypoxia or hypercapnia: Secondary | ICD-10-CM | POA: Diagnosis present

## 2019-08-29 DIAGNOSIS — I43 Cardiomyopathy in diseases classified elsewhere: Secondary | ICD-10-CM | POA: Diagnosis present

## 2019-08-29 DIAGNOSIS — Z20828 Contact with and (suspected) exposure to other viral communicable diseases: Secondary | ICD-10-CM | POA: Diagnosis not present

## 2019-08-29 DIAGNOSIS — Z823 Family history of stroke: Secondary | ICD-10-CM

## 2019-08-29 DIAGNOSIS — F1010 Alcohol abuse, uncomplicated: Secondary | ICD-10-CM | POA: Diagnosis present

## 2019-08-29 DIAGNOSIS — J209 Acute bronchitis, unspecified: Secondary | ICD-10-CM | POA: Diagnosis present

## 2019-08-29 DIAGNOSIS — I5043 Acute on chronic combined systolic (congestive) and diastolic (congestive) heart failure: Secondary | ICD-10-CM

## 2019-08-29 DIAGNOSIS — Z79891 Long term (current) use of opiate analgesic: Secondary | ICD-10-CM

## 2019-08-29 DIAGNOSIS — I2729 Other secondary pulmonary hypertension: Secondary | ICD-10-CM | POA: Diagnosis present

## 2019-08-29 DIAGNOSIS — I110 Hypertensive heart disease with heart failure: Secondary | ICD-10-CM | POA: Diagnosis not present

## 2019-08-29 DIAGNOSIS — J9601 Acute respiratory failure with hypoxia: Secondary | ICD-10-CM | POA: Diagnosis not present

## 2019-08-29 DIAGNOSIS — I10 Essential (primary) hypertension: Secondary | ICD-10-CM

## 2019-08-29 DIAGNOSIS — R0789 Other chest pain: Secondary | ICD-10-CM | POA: Diagnosis not present

## 2019-08-29 DIAGNOSIS — I5042 Chronic combined systolic (congestive) and diastolic (congestive) heart failure: Secondary | ICD-10-CM | POA: Diagnosis present

## 2019-08-29 DIAGNOSIS — Z8249 Family history of ischemic heart disease and other diseases of the circulatory system: Secondary | ICD-10-CM

## 2019-08-29 DIAGNOSIS — I340 Nonrheumatic mitral (valve) insufficiency: Secondary | ICD-10-CM | POA: Diagnosis present

## 2019-08-29 DIAGNOSIS — I2510 Atherosclerotic heart disease of native coronary artery without angina pectoris: Secondary | ICD-10-CM | POA: Diagnosis present

## 2019-08-29 DIAGNOSIS — Z79899 Other long term (current) drug therapy: Secondary | ICD-10-CM

## 2019-08-29 DIAGNOSIS — I5033 Acute on chronic diastolic (congestive) heart failure: Secondary | ICD-10-CM

## 2019-08-29 DIAGNOSIS — M109 Gout, unspecified: Secondary | ICD-10-CM | POA: Diagnosis present

## 2019-08-29 DIAGNOSIS — Z7982 Long term (current) use of aspirin: Secondary | ICD-10-CM

## 2019-08-29 DIAGNOSIS — I420 Dilated cardiomyopathy: Secondary | ICD-10-CM | POA: Diagnosis present

## 2019-08-29 DIAGNOSIS — Z1211 Encounter for screening for malignant neoplasm of colon: Secondary | ICD-10-CM

## 2019-08-29 DIAGNOSIS — Z7952 Long term (current) use of systemic steroids: Secondary | ICD-10-CM

## 2019-08-29 DIAGNOSIS — I509 Heart failure, unspecified: Secondary | ICD-10-CM

## 2019-08-29 DIAGNOSIS — R0602 Shortness of breath: Secondary | ICD-10-CM | POA: Diagnosis not present

## 2019-08-29 DIAGNOSIS — D709 Neutropenia, unspecified: Secondary | ICD-10-CM | POA: Diagnosis present

## 2019-08-29 LAB — CBC WITH DIFFERENTIAL/PLATELET
Abs Immature Granulocytes: 0.01 10*3/uL (ref 0.00–0.07)
Basophils Absolute: 0 10*3/uL (ref 0.0–0.1)
Basophils Relative: 0 %
Eosinophils Absolute: 0.1 10*3/uL (ref 0.0–0.5)
Eosinophils Relative: 3 %
HCT: 41.7 % (ref 39.0–52.0)
Hemoglobin: 13.8 g/dL (ref 13.0–17.0)
Immature Granulocytes: 0 %
Lymphocytes Relative: 14 %
Lymphs Abs: 0.6 10*3/uL — ABNORMAL LOW (ref 0.7–4.0)
MCH: 31.6 pg (ref 26.0–34.0)
MCHC: 33.1 g/dL (ref 30.0–36.0)
MCV: 95.4 fL (ref 80.0–100.0)
Monocytes Absolute: 0.6 10*3/uL (ref 0.1–1.0)
Monocytes Relative: 14 %
Neutro Abs: 3 10*3/uL (ref 1.7–7.7)
Neutrophils Relative %: 69 %
Platelets: 105 10*3/uL — ABNORMAL LOW (ref 150–400)
RBC: 4.37 MIL/uL (ref 4.22–5.81)
RDW: 12.7 % (ref 11.5–15.5)
WBC: 4.4 10*3/uL (ref 4.0–10.5)
nRBC: 0 % (ref 0.0–0.2)

## 2019-08-29 LAB — COMPREHENSIVE METABOLIC PANEL
ALT: 38 U/L (ref 0–44)
AST: 27 U/L (ref 15–41)
Albumin: 3.9 g/dL (ref 3.5–5.0)
Alkaline Phosphatase: 53 U/L (ref 38–126)
Anion gap: 9 (ref 5–15)
BUN: 14 mg/dL (ref 6–20)
CO2: 27 mmol/L (ref 22–32)
Calcium: 8.9 mg/dL (ref 8.9–10.3)
Chloride: 105 mmol/L (ref 98–111)
Creatinine, Ser: 1.23 mg/dL (ref 0.61–1.24)
GFR calc Af Amer: >60 mL/min (ref >60)
GFR calc non Af Amer: >60 mL/min (ref >60)
Glucose, Bld: 105 mg/dL — ABNORMAL HIGH (ref 70–99)
Potassium: 3.9 mmol/L (ref 3.5–5.1)
Sodium: 141 mmol/L (ref 135–145)
Total Bilirubin: 1.2 mg/dL (ref 0.3–1.2)
Total Protein: 7.3 g/dL (ref 6.5–8.1)

## 2019-08-29 LAB — TROPONIN I (HIGH SENSITIVITY)
Troponin I (High Sensitivity): 12 ng/L (ref <18)
Troponin I (High Sensitivity): 12 ng/L (ref <18)

## 2019-08-29 LAB — POC SARS CORONAVIRUS 2 AG -  ED: SARS Coronavirus 2 Ag: NEGATIVE

## 2019-08-29 LAB — BRAIN NATRIURETIC PEPTIDE: B Natriuretic Peptide: 1054.2 pg/mL — ABNORMAL HIGH (ref 0.0–100.0)

## 2019-08-29 LAB — SARS CORONAVIRUS 2 (TAT 6-24 HRS): SARS Coronavirus 2: NEGATIVE

## 2019-08-29 MED ORDER — TIZANIDINE HCL 2 MG PO TABS
2.0000 mg | ORAL_TABLET | Freq: Three times a day (TID) | ORAL | Status: DC
  Administered 2019-08-29 – 2019-09-01 (×8): 2 mg via ORAL
  Filled 2019-08-29 (×8): qty 1

## 2019-08-29 MED ORDER — LORAZEPAM 1 MG PO TABS
1.0000 mg | ORAL_TABLET | ORAL | Status: DC | PRN
  Administered 2019-08-30: 1 mg via ORAL
  Filled 2019-08-29: qty 1

## 2019-08-29 MED ORDER — AMLODIPINE BESYLATE 10 MG PO TABS
10.0000 mg | ORAL_TABLET | Freq: Every day | ORAL | Status: DC
  Administered 2019-08-30 – 2019-09-01 (×3): 10 mg via ORAL
  Filled 2019-08-29: qty 1
  Filled 2019-08-29: qty 2
  Filled 2019-08-29: qty 1

## 2019-08-29 MED ORDER — SODIUM CHLORIDE 0.9 % IV SOLN
500.0000 mL | Freq: Once | INTRAVENOUS | Status: AC
  Administered 2019-08-29: 500 mL via INTRAVENOUS

## 2019-08-29 MED ORDER — THIAMINE HCL 100 MG/ML IJ SOLN: 100.0000 mg | Freq: Every day | INTRAMUSCULAR | Status: DC

## 2019-08-29 MED ORDER — FOLIC ACID 1 MG PO TABS
1.0000 mg | ORAL_TABLET | Freq: Every day | ORAL | Status: DC
  Administered 2019-08-30 – 2019-09-01 (×3): 1 mg via ORAL
  Filled 2019-08-29 (×3): qty 1

## 2019-08-29 MED ORDER — ALBUTEROL SULFATE (2.5 MG/3ML) 0.083% IN NEBU
2.5000 mg | INHALATION_SOLUTION | Freq: Three times a day (TID) | RESPIRATORY_TRACT | Status: DC
  Administered 2019-08-29 – 2019-08-30 (×2): 2.5 mg via RESPIRATORY_TRACT
  Filled 2019-08-29 (×2): qty 3

## 2019-08-29 MED ORDER — BENAZEPRIL HCL 10 MG PO TABS
40.0000 mg | ORAL_TABLET | Freq: Every day | ORAL | Status: DC
  Administered 2019-08-30 – 2019-09-01 (×3): 40 mg via ORAL
  Filled 2019-08-29 (×2): qty 4
  Filled 2019-08-29: qty 2

## 2019-08-29 MED ORDER — FUROSEMIDE 10 MG/ML IJ SOLN
40.0000 mg | Freq: Two times a day (BID) | INTRAMUSCULAR | Status: DC
  Administered 2019-08-29 – 2019-08-30 (×2): 40 mg via INTRAVENOUS
  Filled 2019-08-29 (×2): qty 4

## 2019-08-29 MED ORDER — ROSUVASTATIN CALCIUM 10 MG PO TABS
10.0000 mg | ORAL_TABLET | Freq: Every day | ORAL | Status: DC
  Administered 2019-08-30 – 2019-09-01 (×3): 10 mg via ORAL
  Filled 2019-08-29 (×3): qty 1

## 2019-08-29 MED ORDER — THIAMINE HCL 100 MG PO TABS
100.0000 mg | ORAL_TABLET | Freq: Every day | ORAL | Status: DC
  Administered 2019-08-29 – 2019-09-01 (×4): 100 mg via ORAL
  Filled 2019-08-29 (×4): qty 1

## 2019-08-29 MED ORDER — FUROSEMIDE 10 MG/ML IJ SOLN
40.0000 mg | Freq: Once | INTRAMUSCULAR | Status: AC
  Administered 2019-08-29: 40 mg via INTRAVENOUS
  Filled 2019-08-29: qty 4

## 2019-08-29 MED ORDER — ONDANSETRON 4 MG PO TBDP: 8.0000 mg | ORAL_TABLET | Freq: Three times a day (TID) | ORAL | Status: DC | PRN

## 2019-08-29 MED ORDER — GABAPENTIN 300 MG PO CAPS
300.0000 mg | ORAL_CAPSULE | Freq: Three times a day (TID) | ORAL | Status: DC
  Administered 2019-08-29 – 2019-09-01 (×8): 300 mg via ORAL
  Filled 2019-08-29 (×2): qty 1
  Filled 2019-08-29: qty 3
  Filled 2019-08-29: qty 1
  Filled 2019-08-29: qty 3
  Filled 2019-08-29 (×3): qty 1

## 2019-08-29 MED ORDER — LORAZEPAM 2 MG/ML IJ SOLN
0.0000 mg | Freq: Two times a day (BID) | INTRAMUSCULAR | Status: DC
  Filled 2019-08-29: qty 2

## 2019-08-29 MED ORDER — HYDRALAZINE HCL 50 MG PO TABS
100.0000 mg | ORAL_TABLET | Freq: Three times a day (TID) | ORAL | Status: DC
  Administered 2019-08-30 – 2019-09-01 (×7): 100 mg via ORAL
  Filled 2019-08-29 (×4): qty 2
  Filled 2019-08-29: qty 10
  Filled 2019-08-29 (×4): qty 2

## 2019-08-29 MED ORDER — ONDANSETRON HCL 4 MG/2ML IJ SOLN
4.0000 mg | Freq: Once | INTRAMUSCULAR | Status: AC
  Administered 2019-08-29: 4 mg via INTRAVENOUS
  Filled 2019-08-29: qty 2

## 2019-08-29 MED ORDER — ISOSORBIDE MONONITRATE ER 60 MG PO TB24
60.0000 mg | ORAL_TABLET | Freq: Every day | ORAL | Status: DC
  Administered 2019-08-30 – 2019-09-01 (×3): 60 mg via ORAL
  Filled 2019-08-29 (×3): qty 1

## 2019-08-29 MED ORDER — DOXYCYCLINE HYCLATE 100 MG PO TABS
100.0000 mg | ORAL_TABLET | Freq: Two times a day (BID) | ORAL | Status: DC
  Administered 2019-08-29 – 2019-09-01 (×6): 100 mg via ORAL
  Filled 2019-08-29 (×6): qty 1

## 2019-08-29 MED ORDER — DOXAZOSIN MESYLATE 2 MG PO TABS
6.0000 mg | ORAL_TABLET | Freq: Every day | ORAL | Status: DC
  Administered 2019-08-29 – 2019-08-31 (×3): 6 mg via ORAL
  Filled 2019-08-29: qty 1
  Filled 2019-08-29 (×2): qty 3

## 2019-08-29 MED ORDER — CARVEDILOL 25 MG PO TABS
25.0000 mg | ORAL_TABLET | Freq: Two times a day (BID) | ORAL | Status: DC
  Administered 2019-08-29 – 2019-09-01 (×6): 25 mg via ORAL
  Filled 2019-08-29 (×6): qty 1

## 2019-08-29 MED ORDER — ASPIRIN EC 81 MG PO TBEC
81.0000 mg | DELAYED_RELEASE_TABLET | Freq: Every day | ORAL | Status: DC
  Administered 2019-08-30 – 2019-09-01 (×3): 81 mg via ORAL
  Filled 2019-08-29 (×3): qty 1

## 2019-08-29 MED ORDER — GUAIFENESIN ER 600 MG PO TB12
600.0000 mg | ORAL_TABLET | Freq: Two times a day (BID) | ORAL | Status: DC
  Administered 2019-08-29 – 2019-09-01 (×6): 600 mg via ORAL
  Filled 2019-08-29 (×6): qty 1

## 2019-08-29 MED ORDER — ADULT MULTIVITAMIN W/MINERALS CH
1.0000 | ORAL_TABLET | Freq: Every day | ORAL | Status: DC
  Administered 2019-08-29 – 2019-09-01 (×4): 1 via ORAL
  Filled 2019-08-29 (×4): qty 1

## 2019-08-29 MED ORDER — LORATADINE 10 MG PO TABS
10.0000 mg | ORAL_TABLET | Freq: Every day | ORAL | Status: DC
  Administered 2019-08-30 – 2019-09-01 (×3): 10 mg via ORAL
  Filled 2019-08-29 (×3): qty 1

## 2019-08-29 MED ORDER — LORAZEPAM 2 MG/ML IJ SOLN: 0.0000 mg | Freq: Four times a day (QID) | INTRAMUSCULAR | Status: AC

## 2019-08-29 MED ORDER — POTASSIUM CHLORIDE CRYS ER 20 MEQ PO TBCR
20.0000 meq | EXTENDED_RELEASE_TABLET | Freq: Every day | ORAL | Status: DC
  Administered 2019-08-30 – 2019-09-01 (×3): 20 meq via ORAL
  Filled 2019-08-29 (×3): qty 1

## 2019-08-29 MED ORDER — LORAZEPAM 2 MG/ML IJ SOLN: 1.0000 mg | INTRAMUSCULAR | Status: DC | PRN

## 2019-08-29 MED ORDER — CLONIDINE HCL 0.2 MG PO TABS
0.2000 mg | ORAL_TABLET | Freq: Two times a day (BID) | ORAL | Status: DC
  Administered 2019-08-29 – 2019-09-01 (×6): 0.2 mg via ORAL
  Filled 2019-08-29: qty 2
  Filled 2019-08-29: qty 1
  Filled 2019-08-29: qty 2
  Filled 2019-08-29 (×3): qty 1

## 2019-08-29 NOTE — ED Notes (Signed)
Patient is now endorsing chest pain.

## 2019-08-29 NOTE — ED Notes (Signed)
ED TO INPATIENT HANDOFF REPORT  Name/Age/Gender Daniel Joseph 58 y.o. male  Code Status Code Status History    Date Active Date Inactive Code Status Order ID Comments User Context   03/16/2019 1407 03/16/2019 1931 Full Code [id number]  Larey Dresser, MD Inpatient   01/19/2014 2129 01/25/2014 2038 Full Code [id number]  Berle Mull, MD ED   Advance Care Planning Activity      Home/SNF/Other Home  Chief Complaint Acute exacerbation of CHF (congestive heart failure) ([place]) [I50.9]  Level of Care/Admitting Diagnosis ED Disposition    ED Disposition Condition Comment   Admit  Hospital Area: [facility] [[id number]]  Level of Care: Telemetry [5]  Admit to tele based on following criteria: Acute CHF  Covid Evaluation: N/A  Diagnosis: Acute exacerbation of CHF (congestive heart failure) [facility]) [[id number]]  Admitting Physician: Morrison Old  Attending Physician: Morrison Old  Bed request comments: tele       Medical History Past Medical History:  Diagnosis Date  . Benign essential HTN 01/19/2014   Renal Artery [place] 6/16:  No RAS, No AAA  . Cardiomyopathy, dilated, nonischemic (HCC)    EF 45-50% bye echo 08/2015 and EF 50-55% by echo 10/2017  . Chronic combined systolic and diastolic CHF (congestive heart failure) ([place]) 01/21/2014  . 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 Gout   . Mitral valve regurgitation    mild to moderate MR with MVP of ANMVL by echo 2019  . PVC's (premature ventricular contractions)    nonsustained VT as well as PVCs - no ICD indicated due to EF 40-45%  . Sleep apnea    pending second sleep study   . 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0 - 17.0 g/dL   HCT 41.7 39.0 - 52.0 %   MCV 95.4 80.0 - 100.0 fL   MCH 31.6 26.0 - 34.0 pg   MCHC 33.1 30.0 - 36.0 g/dL   RDW 12.7 11.5 - 15.5 %   Platelets 105 (L) 150 - 400 K/uL    Comment: Immature Platelet Fraction may be clinically indicated, consider ordering this additional test [id number]    nRBC 0.0 0.0 - 0.2 %   Neutrophils Relative % 69 %   Neutro Abs 3.0 1.7 - 7.7 K/uL   Lymphocytes Relative 14 %   Lymphs Abs 0.6 (L) 0.7 - 4.0 K/uL   Monocytes Relative 14 %   Monocytes Absolute 0.6 0.1 - 1.0 K/uL   Eosinophils Relative 3 %   Eosinophils Absolute 0.1 0.0 - 0.5 K/uL  Basophils Relative 0 %   Basophils Absolute 0.0 0.0 - 0.1 K/uL   Immature Granulocytes 0 %   Abs Immature Granulocytes 0.01 0.00 - 0.07 K/uL    Comment: Performed at [facility], [place] [address], [place], [place] [address]  Comprehensive metabolic panel     Status: Abnormal   Collection Time: 08/29/19  9:26 AM  Result Value Ref Range   Sodium 141 135 - 145 mmol/L   Potassium 3.9 3.5 - 5.1 mmol/L   Chloride 105 98 - 111 mmol/L   CO2 27 22 - 32 mmol/L   Glucose, Bld 105 (H) 70 - 99 mg/dL   BUN 14 6 - 20 mg/dL   Creatinine, Ser 1.23 0.61 - 1.24 mg/dL   Calcium 8.9 8.9 - 10.3 mg/dL   Total Protein 7.3 6.5 - 8.1 g/dL   Albumin 3.9 3.5 - 5.0 g/dL   AST 27 15 - 41 U/L   ALT 38 0 - 44 U/L   Alkaline Phosphatase 53 38 - 126 U/L   Total Bilirubin 1.2 0.3 - 1.2 mg/dL   GFR calc non Af Amer >60  >60 mL/min   GFR calc Af Amer >60 >60 mL/min   Anion gap 9 5 - 15    Comment: Performed at [facility], [place] [address] [place], [place] [address]  Brain natriuretic peptide     Status: Abnormal   Collection Time: 08/29/19  9:26 AM  Result Value Ref Range   B Natriuretic Peptide 1,054.2 (H) 0.0 - 100.0 pg/mL    Comment: Performed at [facility], [place] [address], [place], [place] [address]  POC SARS Coronavirus 2 Ag-ED - Nasal Swab (BD Veritor Kit)     Status: None   Collection Time: 08/29/19 12:31 PM  Result Value Ref Range   SARS Coronavirus 2 Ag NEGATIVE NEGATIVE    Comment: (NOTE) SARS-CoV-2 antigen NOT DETECTED.  Negative results are presumptive.  Negative results do not preclude SARS-CoV-2 infection and should not be used as the sole basis for treatment or other patient management decisions, including infection  control decisions, particularly in the presence of clinical signs and  symptoms consistent with COVID-19, or in those who have been in contact with the virus.  Negative results must be combined with clinical observations, patient history, and epidemiological information. The expected result is Negative. Fact Sheet for Patients: PodPark.tn Fact Sheet for Healthcare Providers: GiftContent.is This test is not yet approved or cleared by the [place] FDA and  has been authorized for detection and/or diagnosis of SARS-CoV-2 by FDA under an Emergency Use Authorization (EUA).  This EUA will remain in effect (meaning this test can be used) for the duration of  the COVID-19 de claration under Section 564(b)(1) of the Act, 21 U.S.C. section 360bbb-3(b)(1), unless the authorization is terminated or revoked sooner.   Troponin I (High Sensitivity)     Status: None   Collection Time: 08/29/19  1:04 PM  Result Value Ref Range   Troponin I (High Sensitivity) 12 <18 ng/L    Comment:  (NOTE) Elevated high sensitivity troponin I (hsTnI) values and significant  changes across serial measurements may suggest ACS but many other  chronic and acute conditions are known to elevate hsTnI results.  Refer to the "Links" section for chest pain algorithms and additional  guidance. Performed at [facility], [place] [address], [place], [place] [address]   Troponin I (High Sensitivity)     Status: None  Collection Time: 08/29/19  3:02 PM  Result Value Ref Range   Troponin I (High Sensitivity) 12 <18 ng/L    Comment: (NOTE) Elevated high sensitivity troponin I (hsTnI) values and significant  changes across serial measurements may suggest ACS but many other  chronic and acute conditions are known to elevate hsTnI results.  Refer to the "Links" section for chest pain algorithms and additional  guidance. Performed at [facility], [place] [address], [place], [place] [address]    DG Chest Portable 1 View  Result Date: 08/29/2019 CLINICAL DATA:  Shortness of breath, chest pain. EXAM: PORTABLE CHEST 1 VIEW COMPARISON:  Jan 19, 2015. FINDINGS: The heart size and mediastinal contours are within normal limits. Both lungs are clear. No pneumothorax or pleural effusion is noted. The visualized skeletal structures are unremarkable. IMPRESSION: No active disease. Electronically Signed   By: Marijo Conception M.D.   On: 08/29/2019 09:45    Pending Labs Unresulted Labs (From admission, onward)    Start     Ordered   08/30/19 0500  Phosphorus  Tomorrow morning,   R     08/29/19 1920   08/30/19 0500  Magnesium  Tomorrow morning,   R     08/29/19 1920   08/29/19 1250  SARS CORONAVIRUS 2 (TAT 6-24 HRS) Nasopharyngeal Nasopharyngeal Swab  (Tier 3 (TAT 6-24 hrs))  Once,   STAT    Question Answer Comment  Is this test for diagnosis or screening Screening   Symptomatic for COVID-19 as defined by CDC No   Hospitalized for COVID-19 No   Admitted to ICU for COVID-19  No   Previously tested for COVID-19 Yes   Resident in a congregate (group) care setting No   Employed in healthcare setting No      08/29/19 1249          Vitals/Pain Today's Vitals   08/29/19 1600 08/29/19 1630 08/29/19 1802 08/29/19 1914  BP: (!) 154/102 (!) 147/99 (!) 159/106 (!) 161/108  Pulse:   70 72  Resp:  (!) 22 (!) 21 14  Temp:    99.2 F (37.3 C)  TempSrc:    Oral  SpO2: 98%  96% 100%  Weight:      Height:      PainSc: 0-No pain   0-No pain    Isolation Precautions No active isolations  Medications Medications  furosemide (LASIX) injection 40 mg (has no administration in time range)  LORazepam (ATIVAN) tablet 1-4 mg (has no administration in time range)    Or  LORazepam (ATIVAN) injection 1-4 mg (has no administration in time range)  thiamine (VITAMIN B-1) tablet 100 mg (has no administration in time range)    Or  thiamine (B-1) injection 100 mg (has no administration in time range)  folic acid (FOLVITE) tablet 1 mg (has no administration in time range)  multivitamin with minerals tablet 1 tablet (has no administration in time range)  LORazepam (ATIVAN) injection 0-4 mg (has no administration in time range)    Followed by  LORazepam (ATIVAN) injection 0-4 mg (has no administration in time range)  guaiFENesin (MUCINEX) 12 hr tablet 600 mg (has no administration in time range)  albuterol (PROVENTIL) (2.5 MG/3ML) 0.083% nebulizer solution 2.5 mg (has no administration in time range)  doxycycline (VIBRA-TABS) tablet 100 mg (has no administration in time range)  ondansetron (ZOFRAN) injection 4 mg (4 mg Intravenous Given 08/29/19 1005)  furosemide (LASIX) injection 40 mg (40 mg Intravenous Given 08/29/19 1332)  Mobility walks  

## 2019-08-29 NOTE — ED Notes (Addendum)
Ambulate pt around the room start 94% end 89%

## 2019-08-29 NOTE — ED Provider Notes (Signed)
[facility] DEPT Provider Note   CSN: [id number] Arrival date & time: 08/29/19  0757     History Chief Complaint  Patient presents with  . Nasal Congestion  . Cough    Daniel Joseph is a 58 y.o. male.  58 y/o male with a PMH of hypertension, CHF, cardiomyopathy presents to the ED with complaints of nasal congestion, chest pressure x 2 days.  Patient reports he has been feeling unwell, has felt nauseated, reports pain along the epigastric region, reports he has got a productive cough with green to yellow sputum.  She also endorses nausea, he has been taken " nausea pills" which she was previously prescribed in the past by his PCP.  According to his records which have reviewed extensively, there is no PCP on file that he has seen, he did request an appointment from cardiology in order to obtain a PCP.  He does have a history of heart failure, denies any swelling to his leg.  He does endorse some shortness of breath, this is worse with exertion.  He reports a chest pressure to the center without any radiation.  Denies any fever, sick exposures, urinary symptoms.    The history is provided by the patient and medical records.  Cough Associated symptoms: chest pain, chills and myalgias   Associated symptoms: no fever and no sore throat        Past Medical History:  Diagnosis Date  . Benign essential HTN 01/19/2014   Renal Artery [place] 6/16:  No RAS, No AAA  . Cardiomyopathy, dilated, nonischemic (HCC)    EF 45-50% bye echo 08/2015 and EF 50-55% by echo 10/2017  . Chronic combined systolic and diastolic CHF (congestive heart failure) ([place]) 01/21/2014  . 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 Gout   . Mitral valve regurgitation    mild to moderate MR with MVP of ANMVL by echo 2019  . PVC's (premature  ventricular contractions)    nonsustained VT as well as PVCs - no ICD indicated due to EF 40-45%  . Sleep apnea    pending second sleep study   . 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 Acute exacerbation of CHF (congestive heart failure) ([place]) 08/29/2019  . PVC's (premature ventricular contractions)   . Acute midline low back pain without sciatica 09/19/2016  . Acute gout 03/06/2016  . Left elbow pain 03/06/2016  . Heel spur 08/29/2015  . Nonsustained paroxysmal ventricular tachycardia ([place]) 01/26/2015  . Encounter for staple removal 08/23/2014  . HTN (hypertension) 03/02/2014  . Chronic combined systolic and diastolic CHF (congestive heart failure) ([place]) 03/02/2014  . Abnormal EKG 03/02/2014  . Hypertensive cardiomyopathy ([place]) 03/02/2014  . CKD (chronic kidney disease), stage III 02/15/2014  . Proteinuria 02/15/2014  . Neck nodule 02/01/2014  . Eye redness 02/01/2014  . Mitral valve regurgitation 01/21/2014  . 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 Nocturia 01/19/2014  . Headache 01/19/2014  . T wave inversion in EKG 01/19/2014    Past Surgical History:  Procedure Laterality Date  . ABDOMINAL SURGERY  at birth  . BUBBLE STUDY  03/16/2019   Procedure: BUBBLE STUDY;  Surgeon: Larey Dresser, MD;  Location: [facility] ENDOSCOPY;  Service: Cardiovascular;;  . RIGHT/LEFT  HEART CATH AND CORONARY ANGIOGRAPHY N/A 03/16/2019   Procedure: RIGHT/LEFT HEART CATH AND CORONARY ANGIOGRAPHY;  Surgeon: Larey Dresser, MD;  Location: [facility] CV LAB;  Service: Cardiovascular;  Laterality: N/A;  . TEE WITHOUT CARDIOVERSION N/A 01/25/2014   Procedure: TRANSESOPHAGEAL ECHOCARDIOGRAM (TEE);  Surgeon: [name], MD;  Location: [facility];  Service: Cardiovascular;  Laterality: N/A;  . TEE WITHOUT CARDIOVERSION N/A 09/14/2014   Procedure: TRANSESOPHAGEAL ECHOCARDIOGRAM (TEE);  Surgeon: Sueanne Margarita, MD;  Location: [facility];  Service: Cardiovascular;  Laterality: N/A;  . TEE WITHOUT CARDIOVERSION N/A 03/16/2019   Procedure: TRANSESOPHAGEAL ECHOCARDIOGRAM (TEE);  Surgeon: Larey Dresser, MD;  Location: [facility] ENDOSCOPY;  Service: Cardiovascular;  Laterality: N/A;       Family History  Problem Relation Age of Onset  . Heart Problems Mother   . Stroke Mother   . Heart failure Mother   . Hypertension Mother   . Heart Problems Father   . Heart attack Father   . Hypertension Father   . Heart Problems Brother   . Heart attack Brother   . Hypertension Brother   . Colon cancer Neg Hx   . Esophageal cancer Neg Hx   . Stomach cancer Neg Hx   . Colon polyps Neg Hx   . Rectal cancer Neg Hx     Social History   Tobacco Use  . Smoking status: Never Smoker  . Smokeless tobacco: Never Used  Substance Use Topics  . Alcohol use: No  . Drug use: No    Home Medications Prior to Admission medications   Medication Sig Start Date End Date Taking? Authorizing Provider  acetaminophen (TYLENOL) 650 MG CR tablet Take 1,300 mg by mouth every 8 (eight) hours as needed for pain.    [provider]  allopurinol (ZYLOPRIM) 100 MG tablet Take 1 tablet (100 mg total) by mouth 2 (two) times daily. 03/04/19   Lanae Boast, FNP  amLODipine (NORVASC) 10 MG tablet Take 1 tablet (10 mg total) by mouth daily. 03/04/19   Lanae Boast, FNP  aspirin EC 81 MG tablet Take 1 tablet (81 mg total) by mouth daily. 04/01/14   Deboraha Sprang, MD  benazepril (LOTENSIN) 40 MG tablet Take 1 tablet (40 mg total) by mouth daily. 01/14/19   Lanae Boast, FNP  bisacodyl (DULCOLAX) 5 MG EC tablet Take 1 tablet (5 mg total) by mouth daily as needed for moderate constipation. 03/23/19   Doran Stabler, MD  carvedilol (COREG) 25 MG tablet Take 1 tablet (25 mg total) by mouth 2 (two) times daily. 05/27/19 08/25/19  Clegg, Amy D, NP  cetirizine (ZYRTEC) 10 MG tablet TAKE 1 TABLET (10 MG TOTAL) BY MOUTH DAILY. 06/07/19   [name], MD  cloNIDine (CATAPRES) 0.2 MG tablet Take 1 tablet (0.2 mg total) by mouth 2 (two) times daily. 07/20/19   Larey Dresser, MD  doxazosin (CARDURA) 4 MG tablet TAKE 1.5 TABLETS (6 MG TOTAL) BY MOUTH AT BEDTIME. 06/07/19   Jegede, Marlena Clipper, MD  furosemide (LASIX) 40 MG tablet Take 40 mg by mouth 2 (two) times daily.    [provider]  gabapentin (NEURONTIN) 300 MG capsule Take 1 capsule (300 mg total) by mouth 3 (three) times daily. 01/14/19   Lanae Boast, FNP  hydrALAZINE (APRESOLINE) 50 MG tablet Take 100 mg by mouth 3 (three) times daily.    [provider]  isosorbide mononitrate (IMDUR) 60 MG 24 hr tablet Take 1 tablet (60  mg total) by mouth daily. 05/18/19 05/17/20  Richardson Dopp T, PA-C  ondansetron (ZOFRAN ODT) 8 MG disintegrating tablet Take 1 tablet (8 mg total) by mouth every 8 (eight) hours as needed for nausea or vomiting. 03/04/19   Lanae Boast, FNP  polyethylene glycol powder (GLYCOLAX/MIRALAX) 17 GM/SCOOP powder Take 255 g by mouth as directed. 03/23/19   Nelida Meuse III, MD  potassium chloride SA (K-DUR) 20 MEQ tablet Take 1 tablet (20 mEq total) by mouth daily. 01/21/19   Sueanne Margarita, MD  predniSONE (DELTASONE) 10 MG tablet Take 3 tablets (30 mg total) by mouth daily with breakfast. 08/03/19   Jaynee Eagles, PA-C  rosuvastatin (CRESTOR) 10 MG tablet Take 1 tablet (10 mg total) by mouth daily. 03/26/19 07/20/19  Larey Dresser, MD  spironolactone (ALDACTONE) 50 MG tablet Take 1 tablet (50 mg total) by mouth daily. 06/03/17 07/20/19  Sueanne Margarita, MD  tizanidine (ZANAFLEX) 2 MG capsule Take 2 mg by mouth 3 (three) times daily.    [provider]  traMADol (ULTRAM) 50 MG tablet Take 50 mg by mouth every 8 (eight) hours as needed for moderate pain.     [provider]  triamcinolone cream (KENALOG) 0.1 % Apply 1 application topically 2 (two) times daily as needed ([place]).     [provider]    Allergies    Patient has  no known allergies.  Review of Systems   Review of Systems  Constitutional: Positive for chills. Negative for fever.  HENT: Negative for sore throat.   Respiratory: Positive for cough.   Cardiovascular: Positive for chest pain. Negative for leg swelling.  Gastrointestinal: Negative for abdominal pain, diarrhea, nausea and vomiting.  Genitourinary: Negative for flank pain.  Musculoskeletal: Positive for myalgias.  Skin: Negative for pallor and wound.  Neurological: Negative for light-headedness.    Physical Exam Updated Vital Signs BP (!) 146/99   Pulse 79   Temp 98.3 F (36.8 C) (Oral)   Resp 18   Ht 6\' 1"  (1.854 m)   Wt 102.1 kg   SpO2 93%   BMI 29.69 kg/m   Physical Exam Vitals and nursing note reviewed.  Constitutional:      Appearance: Normal appearance. He is well-developed.  HENT:     Head: Normocephalic and atraumatic.  Eyes:     General: No scleral icterus.    Pupils: Pupils are equal, round, and reactive to light.  Pulmonary:     Effort: Pulmonary effort is normal.     Breath sounds: Rales present. No wheezing.  Chest:     Chest wall: No tenderness.  Abdominal:     General: Bowel sounds are normal. There is no distension.     Palpations: Abdomen is soft.     Tenderness: There is abdominal tenderness in the right upper quadrant, epigastric area and left upper quadrant. There is no right CVA tenderness or left CVA tenderness.  Musculoskeletal:        General: No tenderness or deformity.     Cervical back: Normal range of motion.  Skin:    General: Skin is warm and dry.  Neurological:     Mental Status: He is alert and oriented to person, place, and time.     ED Results / Procedures / Treatments   Labs (all labs ordered are listed, but only abnormal results are displayed) Labs Reviewed  CBC WITH DIFFERENTIAL/PLATELET - Abnormal; Notable for the following components:      Result Value  Platelets 105 (*)    Lymphs Abs 0.6 (*)    All other  components within normal limits  COMPREHENSIVE METABOLIC PANEL - Abnormal; Notable for the following components:   Glucose, Bld 105 (*)    All other components within normal limits  BRAIN NATRIURETIC PEPTIDE - Abnormal; Notable for the following components:   B Natriuretic Peptide 1,054.2 (*)    All other components within normal limits  SARS CORONAVIRUS 2 (TAT 6-24 HRS)  POC SARS CORONAVIRUS 2 AG -  ED  TROPONIN I (HIGH SENSITIVITY)    EKG EKG Interpretation  Date/Time:  Sunday August 29 2019 09:08:56 EST Ventricular Rate:  75 PR Interval:    QRS Duration: 104 QT Interval:  406 QTC Calculation: [id number] R Axis:   29 Text Interpretation: Sinus rhythm Left ventricular hypertrophy Anterior infarct, old Confirmed by Davonna Belling [phone]) on 08/29/2019 12:47:46 PM   Radiology DG Chest Portable 1 View  Result Date: 08/29/2019 CLINICAL DATA:  Shortness of breath, chest pain. EXAM: PORTABLE CHEST 1 VIEW COMPARISON:  Jan 19, 2015. FINDINGS: The heart size and mediastinal contours are within normal limits. Both lungs are clear. No pneumothorax or pleural effusion is noted. The visualized skeletal structures are unremarkable. IMPRESSION: No active disease. Electronically Signed   By: Marijo Conception M.D.   On: 08/29/2019 09:45    Procedures Procedures (including critical care time)  Medications Ordered in ED Medications  furosemide (LASIX) injection 40 mg (has no administration in time range)  ondansetron (ZOFRAN) injection 4 mg (4 mg Intravenous Given 08/29/19 1005)    ED Course  I have reviewed the triage vital signs and the nursing notes.  Pertinent labs & imaging results that were available during my care of the patient were reviewed by me and considered in my medical decision making (see chart for details).  Clinical Course as of Aug 28 1328  Sun Aug 29, 2019  1233 SARS Coronavirus 2 Ag: NEGATIVE [JS]  1244 B Natriuretic Peptide(!): 1,054.2 [JS]    Clinical Course User  Index [JS] Janeece Fitting, PA-C    Patient with a past medical history of pulmonary hypertension, CHF, cardiomyopathy presents today with complaints of nasal congestion, chest pressure for the past 2 days.  He reports has been taking Zofran to help with his symptoms.  Patient does endorse shortness of breath, this is worse with exertion.  He does have a prior history of CHF, there is no swelling to bilateral extremities.  According to his chart review, he is currently in the search for a primary care physician as of last month, there is no establish care seen on his chart.  Patient does take Lasix 25 mg twice daily, reports compliance with this medication.  Last echocardiogram from March 16, 2019 showed: Ejection fraction of of 50%  CMP showed no electrolyte abnormality, creatinine was within normal limits.  No elevations of LFTs.  CBC without any leukocytosis, hemoglobin is within normal limits.  BNP is 1054.2   Rapid Covid was negative.  Patient is arrival oxygen was 93% on room air, there was no signs of tachypnea however this has been moderate throughout his visit and is now 93%.  Xray of his chest did not show any consolidation, pneumothorax, pleural effusion. No infectious process. He was ambulated by nursing staff and became hypoxic to 89%. He is currently not on any supplemental oxygen at home. He was placed on 2 L [place] to treat his hypoxia. He was given lasix 40mg  to help with  his breathing. Will place call for hospitalist admission.   1:29 PM Spoke to Dr. Joesph Fillers who will admit patient for further managment  Portions of this note were generated with Dragon dictation software. Dictation errors may occur despite best attempts at proofreading.  MDM Rules/Calculators/A&P     Final Clinical Impression(s) / ED Diagnoses Final diagnoses:  Acute on chronic combined systolic and diastolic heart failure [facility])    Rx / DC Orders ED Discharge Orders    None       Janeece Fitting, PA-C 08/29/19  1329    Davonna Belling, MD 08/29/19 1458

## 2019-08-29 NOTE — H&P (Signed)
Patient Demographics:    Daniel Joseph, is a 58 y.o. male  MRN: [id number]   DOB - March 25, 1961  Admit Date - 08/29/2019  Outpatient Primary MD for the patient is [name], MD   Assessment & Plan:    Principal Problem:   Acute exacerbation of CHF (congestive heart failure) ([place]) Active Problems:   Chronic combined systolic and diastolic CHF (congestive heart failure) (HCC)   Hypoxic Respiratory failure, acute (HCC)   CKD (chronic kidney disease), stage III   HTN (hypertension)   Alcohol abuse   1)HFpEF--worsening dyspnea, BNP over 1000, , chest x-ray without acute findings at this time  -Serial troponins flat -patient will primarily diastolic dysfunction CHF/nonischemic cardiomyopathy possibly due to hypertensive cardiomyopathy ----PTA was on po Lasix, query compliance, treat empirically with IV Lasix 40 mg every 12 hours, -Daily weights and fluid input and output management -Continue benazepril, hydralazine/Imdur combo -Continue Coreg, hold Aldactone for now -Recent sleep study was unremarkable  LHC/RHC 03/16/19  1. Normal RA pressure, mildly elevated PCWP.  2. Moderate pulmonary hypertension, PVR not significantly elevated at 2.83, suggesting primarily pulmonary venous hypertension.  3. Nonobstructive coronary disease.  EF- 50 %  2)Hypertension--- resistant HTN, renal artery Dopplers from 03/01/2019 was unremarkable -Restart amlodipine 10 mg daily, benazepril 40 mg daily, Coreg 25 mg twice daily, clonidine 0.2 mg twice daily, Cardura 6 mg nightly, as well as hydralazine/Imdur combo  3)Etoh abuse--- lorazepam per CIWA protocol multivitamin as ordered -Patient drinks up to 12 beers per day  4) history of nonobstructive CAD--- LHC from 03/16/2019 noted, asymptomatic at this time, continue  Crestor, continue isosorbide and Coreg as well as aspirin  5) possible bronchitis--- treat empirically with bronchodilators, mucolytics and doxycycline.  Chest x-ray without acute findings  6) thrombocytopenia--suspect due to direct toxic effect of alcohol on the bone marrow -Anticipate improvement in platelet count with cessation of alcohol intake -If thrombocytopenia persist patient will need work-up including liver ultrasound for possible cirrhosis  7)acute hypoxic respiratory failure--- suspect secondary to CHF compounded by bronchitis--treat underlying etiology as above #1 none #5 -Continue supplemental oxygen, diuretics bronchodilators and mucolytics -Please check patient prior to discharge to see if he qualifies for home O2  With History of - Reviewed by me  Past Medical History:  Diagnosis Date  . Benign essential HTN 01/19/2014   Renal Artery [place] 6/16:  No RAS, No AAA  . Cardiomyopathy, dilated, nonischemic (HCC)    EF 45-50% bye echo 08/2015 and EF 50-55% by echo 10/2017  . Chronic combined systolic and diastolic CHF (congestive heart failure) ([place]) 01/21/2014  . 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 Gout   . Mitral valve regurgitation    mild to moderate MR with MVP of ANMVL by echo 2019  . PVC's (  premature ventricular contractions)    nonsustained VT as well as PVCs - no ICD indicated due to EF 40-45%  . Sleep apnea    pending second sleep study   . 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 ABDOMINAL SURGERY  at birth  . BUBBLE STUDY  03/16/2019   Procedure: BUBBLE STUDY;  Surgeon: Larey Dresser, MD;  Location: [facility] ENDOSCOPY;  Service: Cardiovascular;;  . RIGHT/LEFT HEART CATH AND CORONARY ANGIOGRAPHY N/A 03/16/2019   Procedure: RIGHT/LEFT HEART  CATH AND CORONARY ANGIOGRAPHY;  Surgeon: Larey Dresser, MD;  Location: [place] CV LAB;  Service: Cardiovascular;  Laterality: N/A;  . TEE WITHOUT CARDIOVERSION N/A 01/25/2014   Procedure: TRANSESOPHAGEAL ECHOCARDIOGRAM (TEE);  Surgeon: [name], MD;  Location: [place];  Service: Cardiovascular;  Laterality: N/A;  . TEE WITHOUT CARDIOVERSION N/A 09/14/2014   Procedure: TRANSESOPHAGEAL ECHOCARDIOGRAM (TEE);  Surgeon: Sueanne Margarita, MD;  Location: [place];  Service: Cardiovascular;  Laterality: N/A;  . TEE WITHOUT CARDIOVERSION N/A 03/16/2019   Procedure: TRANSESOPHAGEAL ECHOCARDIOGRAM (TEE);  Surgeon: Larey Dresser, MD;  Location: [facility] ENDOSCOPY;  Service: Cardiovascular;  Laterality: N/A;      Chief Complaint  Patient presents with  . Nasal Congestion  . Cough      HPI:    Daniel Joseph  is a 58 y.o. 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 Normal RA pressure, mildly elevated PCWP.  2. Moderate pulmonary hypertension, PVR not significantly elevated at 2.83, suggesting primarily pulmonary venous hypertension.  3. Nonobstructive coronary disease.   - Patient also had mild URI symptoms with cough congestion myalgias -No high fevers -No palpitations no dizziness no pleuritic symptoms -COVID-19 test is negative however BNP is elevated  -In ED patient was found to be hypoxic with O2 sats around 89% on room -Serial troponins were negative -Chest x-ray without acute findings -BNP over 1000 -COVID-19 negative -Creatinine 1.2 -Platelets 123456 and an  alcoholic male white count is 4.4 with hemoglobin of 13.8    Review of systems:    In addition to the HPI above,   A full Review of  Systems was done, all other systems reviewed are negative except as noted above in HPI , .    Social History:  Reviewed by me    Social History   Tobacco Use  . Smoking status: Never Smoker  . Smokeless tobacco: Never Used  Substance Use Topics  . Alcohol use: No       Family History :  Reviewed by me    Family History  Problem Relation Age of Onset  . Heart Problems Mother   . Stroke Mother   . Heart failure Mother   . Hypertension Mother   . Heart Problems Father   . Heart attack Father   . Hypertension Father   . Heart Problems Brother   . Heart attack Brother   . Hypertension Brother   . Colon cancer Neg Hx   . Esophageal cancer Neg Hx   . Stomach cancer Neg Hx   .  Colon polyps Neg Hx   . Rectal cancer Neg Hx      Home Medications:   Prior to Admission medications   Medication Sig Start Date End Date Taking? Authorizing Provider  acetaminophen (TYLENOL) 650 MG CR tablet Take 1,300 mg by mouth every 8 (eight) hours as needed for pain.   Yes [provider]  allopurinol (ZYLOPRIM) 100 MG tablet Take 1 tablet (100 mg total) by mouth 2 (two) times daily. 03/04/19  Yes Lanae Boast, FNP  amLODipine (NORVASC) 10 MG tablet Take 1 tablet (10 mg total) by mouth daily. 03/04/19  Yes Lanae Boast, FNP  aspirin EC 81 MG tablet Take 1 tablet (81 mg total) by mouth daily. 04/01/14  Yes Deboraha Sprang, MD  benazepril (LOTENSIN) 40 MG tablet Take 1 tablet (40 mg total) by mouth daily. 01/14/19  Yes Lanae Boast, FNP  carvedilol (COREG) 25 MG tablet Take 1 tablet (25 mg total) by mouth 2 (two) times daily. 05/27/19 08/29/19 Yes Clegg, Amy D, NP  cetirizine (ZYRTEC) 10 MG tablet TAKE 1 TABLET (10 MG TOTAL) BY MOUTH DAILY. Patient taking differently: Take 10 mg by mouth daily as needed for allergies.  06/07/19  Yes [name], MD  cloNIDine (CATAPRES) 0.2 MG tablet Take 1 tablet (0.2 mg total) by mouth 2 (two) times daily. 07/20/19  Yes Larey Dresser, MD  diclofenac Sodium (VOLTAREN) 1 % GEL Apply 4 g topically 4 (four) times daily as needed (sciatic nerve).   Yes [provider]  doxazosin (CARDURA) 4 MG tablet TAKE 1.5 TABLETS (6 MG TOTAL) BY MOUTH AT BEDTIME. 06/07/19  Yes Jegede, Olugbemiga E, MD  furosemide (LASIX) 40 MG tablet Take 40 mg by mouth 2 (two) times daily.   Yes [provider]  gabapentin (NEURONTIN) 300 MG capsule Take 1 capsule (300 mg total) by mouth 3 (three) times daily. 01/14/19  Yes Lanae Boast, FNP  hydrALAZINE (APRESOLINE) 50 MG tablet Take 100 mg by mouth 3 (three) times daily.   Yes [provider]  isosorbide mononitrate (IMDUR) 60 MG 24 hr tablet Take 1 tablet (60 mg total) by mouth daily. 05/18/19 05/17/20 Yes Weaver, Scott T, PA-C  ondansetron (ZOFRAN ODT) 8 MG disintegrating tablet Take 1 tablet (8 mg total) by mouth every 8 (eight) hours as needed for nausea or vomiting. 03/04/19  Yes Lanae Boast, FNP  polyethylene glycol powder (GLYCOLAX/MIRALAX) 17 GM/SCOOP powder Take 255 g by mouth as directed. Patient taking differently: Take 17 g by mouth daily as needed for mild constipation.  03/23/19  Yes Danis, Kirke Corin, MD  potassium chloride SA (K-DUR) 20 MEQ tablet Take 1 tablet (20 mEq total) by mouth daily. 01/21/19  Yes Turner, Eber Hong, MD  rosuvastatin (CRESTOR) 10 MG tablet Take 1 tablet (10 mg total) by mouth daily. 03/26/19 08/29/19 Yes Larey Dresser, MD  tizanidine (ZANAFLEX) 2 MG capsule Take 2 mg by mouth 3 (three) times daily.   Yes [provider]  traMADol (ULTRAM) 50 MG tablet Take 50 mg by mouth every 8 (eight) hours as needed for moderate pain.    Yes [provider]  bisacodyl (DULCOLAX) 5 MG EC tablet Take 1 tablet (5 mg total) by mouth daily as needed for moderate constipation. Patient not taking: Reported on  08/29/2019 03/23/19   Doran Stabler, MD  predniSONE (DELTASONE) 10 MG tablet Take 3 tablets (30 mg total) by mouth daily with breakfast. Patient not taking: Reported on 08/29/2019 08/03/19  Jaynee Eagles, PA-C  spironolactone (ALDACTONE) 50 MG tablet Take 1 tablet (50 mg total) by mouth daily. Patient not taking: Reported on 08/29/2019 06/03/17 07/20/19  Sueanne Margarita, MD     Allergies:    No Known Allergies   Physical Exam:   Vitals  Blood pressure (!) 161/108, pulse 72, temperature 99.2 F (37.3 C), temperature source Oral, resp. rate 14, height 6\' 1"  (1.854 m), weight 102.1 kg, SpO2 100 %.  Physical Examination: General appearance - alert, well appearing, and in no distress  Mental status - alert, oriented to person, place, and time,  Eyes - sclera anicteric Nose- -[place] 2L/min Neck - supple,   Chest -diminished in bases, no rales, few scattered wheezes Heart - S1 and S2 normal, regular  Abdomen - soft, nontender, nondistended, no masses or organomegaly Neurological - screening mental status exam normal, neck supple without rigidity, cranial nerves II through XII intact, DTR's normal and symmetric Extremities - no pedal edema noted, intact peripheral pulses  Skin - warm, dry     Data Review:    CBC Recent Labs  Lab 08/29/19 0926  WBC 4.4  HGB 13.8  HCT 41.7  PLT 105*  MCV 95.4  MCH 31.6  MCHC 33.1  RDW 12.7  LYMPHSABS 0.6*  MONOABS 0.6  EOSABS 0.1  BASOSABS 0.0   ------------------------------------------------------------------------------------------------------------------  Chemistries  Recent Labs  Lab 08/29/19 0926  NA 141  K 3.9  CL 105  CO2 27  GLUCOSE 105*  BUN 14  CREATININE 1.23  CALCIUM 8.9  AST 27  ALT 38  ALKPHOS 53  BILITOT 1.2   ------------------------------------------------------------------------------------------------------------------ estimated creatinine clearance is 85.2 mL/min (by C-G formula based on SCr of 1.23  mg/dL). ------------------------------------------------------------------------------------------------------------------ No results for input(s): TSH, T4TOTAL, T3FREE, THYROIDAB in the last 72 hours.  Invalid input(s): FREET3   Coagulation profile No results for input(s): INR, PROTIME in the last 168 hours. ------------------------------------------------------------------------------------------------------------------- No results for input(s): DDIMER in the last 72 hours. -------------------------------------------------------------------------------------------------------------------  Cardiac Enzymes No results for input(s): CKMB, TROPONINI, MYOGLOBIN in the last 168 hours.  Invalid input(s): CK ------------------------------------------------------------------------------------------------------------------    Component Value Date/Time   BNP 1,054.2 (H) 08/29/2019 0926     ---------------------------------------------------------------------------------------------------------------  Urinalysis    Component Value Date/Time   COLORURINE YELLOW 01/19/2014 1841   APPEARANCEUR CLEAR 01/19/2014 1841   LABSPEC 1.020 11/13/2017 0915   PHURINE 5.5 11/13/2017 0915   GLUCOSEU NEGATIVE 11/13/2017 0915   HGBUR NEGATIVE 11/13/2017 0915   BILIRUBINUR negative 12/02/2018 1210   BILIRUBINUR neg 09/02/2018 1003   KETONESUR negative 12/02/2018 1210   KETONESUR NEGATIVE 11/13/2017 0915   PROTEINUR Negative 09/02/2018 1003   PROTEINUR NEGATIVE 11/13/2017 0915   UROBILINOGEN 0.2 12/02/2018 1210   UROBILINOGEN 0.2 11/13/2017 0915   NITRITE Negative 12/02/2018 1210   NITRITE neg 09/02/2018 1003   NITRITE NEGATIVE 11/13/2017 0915   LEUKOCYTESUR Negative 12/02/2018 1210    ----------------------------------------------------------------------------------------------------------------   Imaging Results:    DG Chest Portable 1 View  Result Date: 08/29/2019 CLINICAL DATA:   Shortness of breath, chest pain. EXAM: PORTABLE CHEST 1 VIEW COMPARISON:  Jan 19, 2015. FINDINGS: The heart size and mediastinal contours are within normal limits. Both lungs are clear. No pneumothorax or pleural effusion is noted. The visualized skeletal structures are unremarkable. IMPRESSION: No active disease. Electronically Signed   By: Marijo Conception M.D.   On: 08/29/2019 09:45    Radiological Exams on Admission: DG Chest Portable 1 View  Result Date: 08/29/2019 CLINICAL DATA:  Shortness  of breath, chest pain. EXAM: PORTABLE CHEST 1 VIEW COMPARISON:  Jan 19, 2015. FINDINGS: The heart size and mediastinal contours are within normal limits. Both lungs are clear. No pneumothorax or pleural effusion is noted. The visualized skeletal structures are unremarkable. IMPRESSION: No active disease. Electronically Signed   By: Marijo Conception M.D.   On: 08/29/2019 09:45    DVT Prophylaxis -SCD  AM Labs Ordered, also please review Full Orders  Family Communication: Admission, patients condition and plan of care including tests being ordered have been discussed with the patient and s/o at bedside who indicate understanding and agree with the plan   Code Status - Full Code  Likely DC to  -Home  Condition   stable*  Roxan Hockey M.D on 08/29/2019 at 7:31 PM Go to [email or web address] -  for contact info  [facility] - Office  [phone]

## 2019-08-29 NOTE — ED Triage Notes (Signed)
Patient c/o nasal congestion and a productive cough with white/yellow sputum x 2 days.

## 2019-08-29 NOTE — ED Notes (Signed)
4th Floor will take report/pt. At ~1930 hours per their request.

## 2019-08-30 DIAGNOSIS — R0602 Shortness of breath: Secondary | ICD-10-CM | POA: Diagnosis not present

## 2019-08-30 DIAGNOSIS — Z79891 Long term (current) use of opiate analgesic: Secondary | ICD-10-CM | POA: Diagnosis not present

## 2019-08-30 DIAGNOSIS — I10 Essential (primary) hypertension: Secondary | ICD-10-CM | POA: Diagnosis not present

## 2019-08-30 DIAGNOSIS — I5042 Chronic combined systolic (congestive) and diastolic (congestive) heart failure: Secondary | ICD-10-CM

## 2019-08-30 DIAGNOSIS — J209 Acute bronchitis, unspecified: Secondary | ICD-10-CM | POA: Diagnosis present

## 2019-08-30 DIAGNOSIS — Z7952 Long term (current) use of systemic steroids: Secondary | ICD-10-CM | POA: Diagnosis not present

## 2019-08-30 DIAGNOSIS — Z823 Family history of stroke: Secondary | ICD-10-CM | POA: Diagnosis not present

## 2019-08-30 DIAGNOSIS — I2729 Other secondary pulmonary hypertension: Secondary | ICD-10-CM | POA: Diagnosis present

## 2019-08-30 DIAGNOSIS — I5023 Acute on chronic systolic (congestive) heart failure: Secondary | ICD-10-CM

## 2019-08-30 DIAGNOSIS — I340 Nonrheumatic mitral (valve) insufficiency: Secondary | ICD-10-CM | POA: Diagnosis present

## 2019-08-30 DIAGNOSIS — I2510 Atherosclerotic heart disease of native coronary artery without angina pectoris: Secondary | ICD-10-CM | POA: Diagnosis present

## 2019-08-30 DIAGNOSIS — F1010 Alcohol abuse, uncomplicated: Secondary | ICD-10-CM

## 2019-08-30 DIAGNOSIS — I5033 Acute on chronic diastolic (congestive) heart failure: Secondary | ICD-10-CM | POA: Diagnosis not present

## 2019-08-30 DIAGNOSIS — Z7982 Long term (current) use of aspirin: Secondary | ICD-10-CM | POA: Diagnosis not present

## 2019-08-30 DIAGNOSIS — Z79899 Other long term (current) drug therapy: Secondary | ICD-10-CM | POA: Diagnosis not present

## 2019-08-30 DIAGNOSIS — Z8249 Family history of ischemic heart disease and other diseases of the circulatory system: Secondary | ICD-10-CM | POA: Diagnosis not present

## 2019-08-30 DIAGNOSIS — I420 Dilated cardiomyopathy: Secondary | ICD-10-CM | POA: Diagnosis present

## 2019-08-30 DIAGNOSIS — R0789 Other chest pain: Secondary | ICD-10-CM | POA: Diagnosis not present

## 2019-08-30 DIAGNOSIS — R079 Chest pain, unspecified: Secondary | ICD-10-CM | POA: Diagnosis not present

## 2019-08-30 DIAGNOSIS — I43 Cardiomyopathy in diseases classified elsewhere: Secondary | ICD-10-CM | POA: Diagnosis present

## 2019-08-30 DIAGNOSIS — J9601 Acute respiratory failure with hypoxia: Secondary | ICD-10-CM | POA: Diagnosis present

## 2019-08-30 DIAGNOSIS — I5043 Acute on chronic combined systolic (congestive) and diastolic (congestive) heart failure: Secondary | ICD-10-CM | POA: Diagnosis not present

## 2019-08-30 DIAGNOSIS — Z20828 Contact with and (suspected) exposure to other viral communicable diseases: Secondary | ICD-10-CM | POA: Diagnosis not present

## 2019-08-30 DIAGNOSIS — D696 Thrombocytopenia, unspecified: Secondary | ICD-10-CM | POA: Diagnosis not present

## 2019-08-30 DIAGNOSIS — I110 Hypertensive heart disease with heart failure: Secondary | ICD-10-CM | POA: Diagnosis not present

## 2019-08-30 DIAGNOSIS — M109 Gout, unspecified: Secondary | ICD-10-CM | POA: Diagnosis present

## 2019-08-30 DIAGNOSIS — D709 Neutropenia, unspecified: Secondary | ICD-10-CM | POA: Diagnosis present

## 2019-08-30 DIAGNOSIS — D6959 Other secondary thrombocytopenia: Secondary | ICD-10-CM | POA: Diagnosis present

## 2019-08-30 DIAGNOSIS — I130 Hypertensive heart and chronic kidney disease with heart failure and stage 1 through stage 4 chronic kidney disease, or unspecified chronic kidney disease: Secondary | ICD-10-CM | POA: Diagnosis not present

## 2019-08-30 DIAGNOSIS — I509 Heart failure, unspecified: Secondary | ICD-10-CM | POA: Diagnosis not present

## 2019-08-30 DIAGNOSIS — N183 Chronic kidney disease, stage 3 unspecified: Secondary | ICD-10-CM | POA: Diagnosis present

## 2019-08-30 LAB — BASIC METABOLIC PANEL
Anion gap: 10 (ref 5–15)
BUN: 16 mg/dL (ref 6–20)
CO2: 29 mmol/L (ref 22–32)
Calcium: 8.9 mg/dL (ref 8.9–10.3)
Chloride: 102 mmol/L (ref 98–111)
Creatinine, Ser: 1.81 mg/dL — ABNORMAL HIGH (ref 0.61–1.24)
GFR calc Af Amer: 48 mL/min — ABNORMAL LOW (ref >60)
GFR calc non Af Amer: 41 mL/min — ABNORMAL LOW (ref >60)
Glucose, Bld: 89 mg/dL (ref 70–99)
Potassium: 3.9 mmol/L (ref 3.5–5.1)
Sodium: 141 mmol/L (ref 135–145)

## 2019-08-30 LAB — PHOSPHORUS: Phosphorus: 4.2 mg/dL (ref 2.5–4.6)

## 2019-08-30 LAB — MAGNESIUM: Magnesium: 1.8 mg/dL (ref 1.7–2.4)

## 2019-08-30 MED ORDER — ALBUTEROL SULFATE (2.5 MG/3ML) 0.083% IN NEBU: 2.5000 mg | INHALATION_SOLUTION | Freq: Four times a day (QID) | RESPIRATORY_TRACT | Status: DC | PRN

## 2019-08-30 MED ORDER — ENSURE MAX PROTEIN PO LIQD
11.0000 [oz_av] | ORAL | Status: DC
  Administered 2019-08-30 – 2019-08-31 (×2): 11 [oz_av] via ORAL
  Filled 2019-08-30 (×2): qty 330

## 2019-08-30 MED ORDER — ENOXAPARIN SODIUM 40 MG/0.4ML ~~LOC~~ SOLN
40.0000 mg | SUBCUTANEOUS | Status: DC
  Administered 2019-08-30 – 2019-08-31 (×2): 40 mg via SUBCUTANEOUS
  Filled 2019-08-30 (×2): qty 0.4

## 2019-08-30 MED ORDER — FUROSEMIDE 10 MG/ML IJ SOLN
40.0000 mg | Freq: Two times a day (BID) | INTRAMUSCULAR | Status: DC
  Administered 2019-08-30 – 2019-08-31 (×2): 40 mg via INTRAVENOUS
  Filled 2019-08-30 (×2): qty 4

## 2019-08-30 NOTE — Progress Notes (Signed)
SATURATION QUALIFICATIONS: (This note is used to comply with regulatory documentation for home oxygen)  Patient Saturations on Room Air at Rest  98%  Patient Saturations on Room Air while Ambulating 93%  Patient Saturations on 0 Liters of oxygen while Ambulating   N/A Please briefly explain why patient needs home oxygen:  Oxygen not required while ambulating, pt able to ambulate over 300 feet to the other side of the unit, without shortness of breath and denies chest pain.   SRP, RN

## 2019-08-30 NOTE — Consult Note (Signed)
Cardiology Consultation:   Patient ID: Daniel Joseph MRN: [id number]; DOB: 1961-06-29  Admit date: 08/29/2019 Date of Consult: 08/30/2019  Primary Care Provider: [name], MD Primary Cardiologist: Fransico Him, MD  Primary Electrophysiologist:  None    Patient Profile:   Daniel Joseph is a 58 y.o. male with a hx of hypertension dilated nonischemic cardiomyopathy who is being seen today for the evaluation of acute heart failure at the request of Dr. Horris Latino.  History of Present Illness:   Daniel Joseph is a 58 year old male patient with both diastolic and systolic heart failure admitted with increased shortness of breath, BNP over 1000 with previous hypertensive cardiomyopathy.  Heart cath was performed on 03/16/2019 that showed mildly elevated pulmonary capillary wedge pressure and moderate pulmonary hypertension, EF 50, nonobstructive coronary disease.  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  Bronchitis as well.  He feels much better today.  Has been diuresed approximate 1.2 L today.  Denies any fevers chills nausea vomiting syncope bleeding.  He was supposed to see Dr. [name] tomorrow in clinic.   Heart Pathway Score:     Past Medical History:  Diagnosis Date  . Benign essential HTN 01/19/2014   Renal Artery [place] 6/16:  No RAS, No AAA  . Cardiomyopathy, dilated, nonischemic (HCC)    EF 45-50% bye echo 08/2015 and EF 50-55% by echo 10/2017  . Chronic combined systolic and diastolic CHF (congestive heart failure) ([place]) 01/21/2014  . 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 Gout   . Mitral valve regurgitation    mild to moderate MR with MVP of ANMVL by echo 2019  . PVC's  (premature ventricular contractions)    nonsustained VT as well as PVCs - no ICD indicated due to EF 40-45%  . Sleep apnea    pending second sleep study   . 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 ABDOMINAL SURGERY  at birth  . BUBBLE STUDY  03/16/2019   Procedure: BUBBLE STUDY;  Surgeon: Larey Dresser, MD;  Location: [facility] ENDOSCOPY;  Service: Cardiovascular;;  . RIGHT/LEFT HEART CATH AND CORONARY ANGIOGRAPHY N/A 03/16/2019   Procedure: RIGHT/LEFT HEART CATH AND CORONARY ANGIOGRAPHY;  Surgeon: Larey Dresser, MD;  Location: [facility] LAB;  Service: Cardiovascular;  Laterality: N/A;  . TEE WITHOUT CARDIOVERSION N/A 01/25/2014   Procedure: TRANSESOPHAGEAL ECHOCARDIOGRAM (TEE);  Surgeon: [name], MD;  Location: [place];  Service: Cardiovascular;  Laterality: N/A;  . TEE WITHOUT CARDIOVERSION N/A 09/14/2014   Procedure: TRANSESOPHAGEAL ECHOCARDIOGRAM (TEE);  Surgeon: Sueanne Margarita, MD;  Location: [place];  Service: Cardiovascular;  Laterality: N/A;  . TEE WITHOUT CARDIOVERSION N/A 03/16/2019   Procedure: TRANSESOPHAGEAL ECHOCARDIOGRAM (TEE);  Surgeon: Larey Dresser, MD;  Location: [facility] ENDOSCOPY;  Service: Cardiovascular;  Laterality: N/A;     Home Medications:  Prior to Admission medications   Medication Sig Start Date End Date Taking? Authorizing Provider  acetaminophen (TYLENOL) 650 MG CR tablet Take 1,300 mg by mouth every 8 (eight) hours as needed for pain.   Yes [provider]  allopurinol (ZYLOPRIM) 100 MG tablet Take 1 tablet (100 mg total) by mouth  2 (two) times daily. 03/04/19  Yes Lanae Boast, FNP  amLODipine (NORVASC) 10 MG tablet Take 1 tablet (10 mg total) by mouth daily. 03/04/19  Yes Lanae Boast, FNP  aspirin EC 81 MG tablet Take 1 tablet (81 mg total) by mouth daily. 04/01/14  Yes Deboraha Sprang, MD  benazepril (LOTENSIN) 40 MG tablet Take 1 tablet (40 mg total) by mouth  daily. 01/14/19  Yes Lanae Boast, FNP  carvedilol (COREG) 25 MG tablet Take 1 tablet (25 mg total) by mouth 2 (two) times daily. 05/27/19 08/29/19 Yes Clegg, Amy D, NP  cetirizine (ZYRTEC) 10 MG tablet TAKE 1 TABLET (10 MG TOTAL) BY MOUTH DAILY. Patient taking differently: Take 10 mg by mouth daily as needed for allergies.  06/07/19  Yes [name], MD  cloNIDine (CATAPRES) 0.2 MG tablet Take 1 tablet (0.2 mg total) by mouth 2 (two) times daily. 07/20/19  Yes Larey Dresser, MD  diclofenac Sodium (VOLTAREN) 1 % GEL Apply 4 g topically 4 (four) times daily as needed (sciatic nerve).   Yes [provider]  doxazosin (CARDURA) 4 MG tablet TAKE 1.5 TABLETS (6 MG TOTAL) BY MOUTH AT BEDTIME. 06/07/19  Yes Jegede, Olugbemiga E, MD  furosemide (LASIX) 40 MG tablet Take 40 mg by mouth 2 (two) times daily.   Yes [provider]  gabapentin (NEURONTIN) 300 MG capsule Take 1 capsule (300 mg total) by mouth 3 (three) times daily. 01/14/19  Yes Lanae Boast, FNP  hydrALAZINE (APRESOLINE) 50 MG tablet Take 100 mg by mouth 3 (three) times daily.   Yes [provider]  isosorbide mononitrate (IMDUR) 60 MG 24 hr tablet Take 1 tablet (60 mg total) by mouth daily. 05/18/19 05/17/20 Yes Weaver, Scott T, PA-C  ondansetron (ZOFRAN ODT) 8 MG disintegrating tablet Take 1 tablet (8 mg total) by mouth every 8 (eight) hours as needed for nausea or vomiting. 03/04/19  Yes Lanae Boast, FNP  polyethylene glycol powder (GLYCOLAX/MIRALAX) 17 GM/SCOOP powder Take 255 g by mouth as directed. Patient taking differently: Take 17 g by mouth daily as needed for mild constipation.  03/23/19  Yes Danis, Kirke Corin, MD  potassium chloride SA (K-DUR) 20 MEQ tablet Take 1 tablet (20 mEq total) by mouth daily. 01/21/19  Yes Turner, Eber Hong, MD  rosuvastatin (CRESTOR) 10 MG tablet Take 1 tablet (10 mg total) by mouth daily. 03/26/19 08/29/19 Yes Larey Dresser, MD  tizanidine (ZANAFLEX) 2 MG capsule Take 2 mg by  mouth 3 (three) times daily.   Yes [provider]  traMADol (ULTRAM) 50 MG tablet Take 50 mg by mouth every 8 (eight) hours as needed for moderate pain.    Yes [provider]  bisacodyl (DULCOLAX) 5 MG EC tablet Take 1 tablet (5 mg total) by mouth daily as needed for moderate constipation. Patient not taking: Reported on 08/29/2019 03/23/19   Doran Stabler, MD  predniSONE (DELTASONE) 10 MG tablet Take 3 tablets (30 mg total) by mouth daily with breakfast. Patient not taking: Reported on 08/29/2019 08/03/19   Jaynee Eagles, PA-C  spironolactone (ALDACTONE) 50 MG tablet Take 1 tablet (50 mg total) by mouth daily. Patient not taking: Reported on 08/29/2019 06/03/17 07/20/19  Sueanne Margarita, MD    Inpatient Medications: Scheduled Meds: . amLODipine  10 mg Oral Daily  . aspirin EC  81 mg Oral Daily  . benazepril  40 mg Oral Daily  . carvedilol  25 mg Oral BID  . cloNIDine  0.2 mg Oral BID  . doxazosin  6 mg Oral QHS  . doxycycline  100 mg Oral Q12H  . enoxaparin (LOVENOX) injection  40 mg Subcutaneous Q24H  . folic acid  1 mg Oral Daily  . furosemide  40 mg Intravenous Q12H  . gabapentin  300 mg Oral TID  . guaiFENesin  600 mg Oral BID  . hydrALAZINE  100 mg Oral TID  . isosorbide mononitrate  60 mg Oral Daily  . loratadine  10 mg Oral Daily  . LORazepam  0-4 mg Intravenous Q6H   Followed by  . [START ON 08/31/2019] LORazepam  0-4 mg Intravenous Q12H  . multivitamin with minerals  1 tablet Oral Daily  . potassium chloride SA  20 mEq Oral Daily  . rosuvastatin  10 mg Oral Daily  . thiamine  100 mg Oral Daily   Or  . thiamine  100 mg Intravenous Daily  . tiZANidine  2 mg Oral TID   Continuous Infusions:  PRN Meds: albuterol, LORazepam **OR** LORazepam, ondansetron  Allergies:   No Known Allergies  Social History:   Social History   Socioeconomic History  . Marital status: Divorced    Spouse name: Not on file  . Number of children: Not on file  . Years  of education: Not on file  . Highest education level: Not on file  Occupational History  . Not on file  Tobacco Use  . Smoking status: Never Smoker  . Smokeless tobacco: Never Used  Substance and Sexual Activity  . Alcohol use: No  . Drug use: No  . Sexual activity: Not on file  Other Topics Concern  . Not on file  Social History Narrative  . Not on file   Social Determinants of Health   Financial Resource Strain:   . Difficulty of Paying Living Expenses: Not on file  Food Insecurity:   . Worried About Charity fundraiser in the Last Year: Not on file  . Ran Out of Food in the Last Year: Not on file  Transportation Needs:   . Lack of Transportation (Medical): Not on file  . Lack of Transportation (Non-Medical): Not on file  Physical Activity:   . Days of Exercise per Week: Not on file  . Minutes of Exercise per Session: Not on file  Stress:   . Feeling of Stress : Not on file  Social Connections:   . Frequency of Communication with Friends and Family: Not on file  . Frequency of Social Gatherings with Friends and Family: Not on file  . Attends Religious Services: Not on file  . Active Member of Clubs or Organizations: Not on file  . Attends Archivist Meetings: Not on file  . Marital Status: Not on file  Intimate Partner Violence:   . Fear of Current or Ex-Partner: Not on file  . Emotionally Abused: Not on file  . Physically Abused: Not on file  . Sexually Abused: Not on file    Family History:    Family History  Problem Relation Age of Onset  . Heart Problems Mother   . Stroke Mother   . Heart failure Mother   . Hypertension Mother   . Heart Problems Father   . Heart attack Father   . Hypertension Father   . Heart Problems Brother   . Heart attack Brother   . Hypertension Brother   . Colon cancer Neg Hx   . Esophageal cancer Neg Hx   . Stomach cancer  Neg Hx   . Colon polyps Neg Hx   . Rectal cancer Neg Hx      ROS:  Please see the history  of present illness.   All other ROS reviewed and negative.     Physical Exam/Data:   Vitals:   08/29/19 2348 08/30/19 0550 08/30/19 0816 08/30/19 0929  BP: 135/87 (!) 150/91  (!) 146/94  Pulse: (!) 59 (!) 58 68 68  Resp: 20 18 17    Temp: 98.3 F (36.8 C) 98 F (36.7 C)    TempSrc: Oral Oral    SpO2: 100% 98% 98% 95%  Weight:  98.4 kg    Height:        Intake/Output Summary (Last 24 hours) at 08/30/2019 1551 Last data filed at 08/30/2019 0500 Gross per 24 hour  Intake 720 ml  Output 325 ml  Net 395 ml   Last 3 Weights 08/30/2019 08/29/2019 07/20/2019  Weight (lbs) 217 lb 225 lb 228 lb 3.2 oz  Weight (kg) 98.431 kg 102.059 kg 103.511 kg     Body mass index is 28.63 kg/m.  General:  Well nourished, well developed, in no acute distress HEENT: normal Lymph: no adenopathy Neck: no JVD Endocrine:  No thryomegaly Vascular: No carotid bruits; FA pulses 2+ bilaterally without bruits  Cardiac:  normal S1, S2; RRR; no murmur  Lungs:  clear to auscultation bilaterally, no wheezing, rhonchi or rales  Abd: soft, nontender, no hepatomegaly  Ext: no edema Musculoskeletal:  No deformities, BUE and BLE strength normal and equal Skin: warm and dry  Neuro:  CNs 2-12 intact, no focal abnormalities noted Psych:  Normal affect   EKG:  The EKG was personally reviewed and demonstrates: Sinus rhythm with nonspecific ST-T wave changes LVH  Telemetry:  Telemetry was personally reviewed and demonstrates: Sinus bradycardia 58 no other abnormalities  Relevant CV Studies:  Troponin normal and flat.  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  Moderate mitral regurgitation.  Right heart catheterization: 02/17/2019: Right Heart Pressures RHC Procedural Findings: Hemodynamics (mmHg) RA mean 4 RV 54/6 PA 55/21, mean 34 PCWP mean 17 LV 150/14  Oxygen saturations: PA 75% AO 99%  Cardiac Output (Fick) 6.02  Cardiac Index (Fick) 2.66 PVR 2.83 WU   Left heart cath  demonstrated no significant coronary disease.  Laboratory Data:  High Sensitivity Troponin:   Recent Labs  Lab 08/29/19 1304 08/29/19 1502  TROPONINIHS 12 12     Chemistry Recent Labs  Lab 08/29/19 0926 08/30/19 1416  NA 141 141  K 3.9 3.9  CL 105 102  CO2 27 29  GLUCOSE 105* 89  BUN 14 16  CREATININE 1.23 1.81*  CALCIUM 8.9 8.9  GFRNONAA >60 41*  GFRAA >60 48*  ANIONGAP 9 10    Recent Labs  Lab 08/29/19 0926  PROT 7.3  ALBUMIN 3.9  AST 27  ALT 38  ALKPHOS 53  BILITOT 1.2   Hematology Recent Labs  Lab 08/29/19 0926  WBC 4.4  RBC 4.37  HGB 13.8  HCT 41.7  MCV 95.4  MCH 31.6  MCHC 33.1  RDW 12.7  PLT 105*   BNP Recent Labs  Lab 08/29/19 0926  BNP 1,054.2*    DDimer No results for input(s): DDIMER in the last 168 hours.   Radiology/Studies:  DG Chest Portable 1 View  Result Date: 08/29/2019 CLINICAL DATA:  Shortness of breath, chest pain. EXAM: PORTABLE CHEST 1 VIEW COMPARISON:  Jan 19, 2015. FINDINGS: The heart size and mediastinal contours  are within normal limits. Both lungs are clear. No pneumothorax or pleural effusion is noted. The visualized skeletal structures are unremarkable. IMPRESSION: No active disease. Electronically Signed   By: Marijo Conception M.D.   On: 08/29/2019 09:45    Assessment and Plan:   Acute on chronic diastolic heart failure or heart failure with preserved ejection fraction 50% -Continue with IV Lasix.  He is feeling better after further decongestion.  Creatinine has increased from 1.23 to 1.81.  Repeat basic metabolic profile tomorrow.  Since he is feeling much better, may currently be euvolemic. -Continue to control hypertension.  Chest x-ray no active disease.  Pulmonary hypertension -Right heart catheterization reviewed as above however he has had evidence of difficult to control hypertension and likely has a degree of secondary pulmonary hypertension.  Continue to treat left heart  disease/hypertension/congestion.  Continue to work on alcohol cessation     For questions or updates, please contact [place] Please consult [email or web address] for contact info under     Signed, [name], MD  08/30/2019 3:51 PM

## 2019-08-30 NOTE — Progress Notes (Signed)
Initial Nutrition Assessment  INTERVENTION:   -Ensure MAX Protein po daily, each supplement provides 150 kcal and 30 grams of protein  NUTRITION DIAGNOSIS:   Inadequate oral intake related to poor appetite, nausea as evidenced by per patient/family report.  GOAL:   Patient will meet greater than or equal to 90% of their needs  MONITOR:   Supplement acceptance, PO intake, Labs, Weight trends, I & O's  REASON FOR ASSESSMENT:   Malnutrition Screening Tool    ASSESSMENT:   58 y.o. 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  Patient with history of ETOH abuse (reports 12 beers consumed daily). Pt ate some breakfast this morning but did not order anything for lunch today. Pt has been having nausea and poor appetite PTA. Will order Ensure Max supplement daily for additional protein.   Per weight records, pt has lost 11 lbs since 11/3 (4% wt loss x 1.5 months, insignificant for time frame).   I/Os: -565 ml since admit UOP: 325 ml x 24 hrs  Labs reviewed. Medications: Folic acid tablet, IV Lasix, Multivitamin with minerals daily, KLOR-CON tablet, Thiamine tablet  NUTRITION - FOCUSED PHYSICAL EXAM:  Working remotely.  Diet Order:   Diet Order            Diet Heart Room service appropriate? Yes; Fluid consistency: Thin  Diet effective now              EDUCATION NEEDS:   No education needs have been identified at this time  Skin:  Skin Assessment: Reviewed RN Assessment  Last BM:  12/12  Height:   Ht Readings from Last 1 Encounters:  08/29/19 6\' 1"  (1.854 m)    Weight:   Wt Readings from Last 1 Encounters:  08/30/19 98.4 kg    Ideal Body Weight:  83.6 kg  BMI:  Body mass index is 28.63 kg/m.  Estimated Nutritional  Needs:   Kcal:  [id number]  Protein:  100-110g  Fluid:  2L/day  Clayton Bibles, MS, RD, LDN Inpatient Clinical Dietitian Pager: [phone] After Hours Pager: [phone]

## 2019-08-30 NOTE — Plan of Care (Signed)
  Problem: Education: Goal: Knowledge of General Education information will improve Description Including pain rating scale, medication(s)/side effects and non-pharmacologic comfort measures Outcome: Progressing   

## 2019-08-30 NOTE — Progress Notes (Signed)
PROGRESS NOTE  Daniel Joseph [id number] DOB: [birth year] DOA: 08/29/2019 PCP: [name], MD  HPI/Recap of past 24 hours: HPI from Dr Fletcher Anon is a 58 y.o. 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 Also reported some mild cough, myalgias.  Of note, patient drinks up to 12 beers on a daily basis.  Patient had extensive cardiac work-up including LHC/RHC in 02/2019 and is scheduled to see cardiology on 08/31/2019.  In the ED, patient was found to be hypoxic with sats 89% on room air, serial troponins were negative, EKG with no acute ST changes, BNP greater than 1000, chest x-ray unremarkable.  Patient admitted for further management.    Today, patient still reports some shortness of breath, with some generalized weakness.  Patient denies any chest pain, nausea/vomiting, diarrhea, fever/chills.  -Patient's significant other is at bedside, patient admits to drinking up to 12 beers on a daily basis -Patient has had extensive cardiovascular work-up recently including LHC/RHC with findings as below:- 1. Normal RA pressure, mildly elevated PCWP.  2. Moderate pulmonary hypertension, PVR not significantly elevated at 2.83, suggesting primarily pulmonary venous hypertension.  3. Nonobstructive coronary disease.    Assessment/Plan: Principal Problem:   Acute exacerbation of CHF (congestive heart failure) (HCC) Active Problems:   CKD (chronic kidney disease), stage III   HTN (hypertension)   Chronic combined systolic and diastolic CHF (congestive heart failure) (HCC)   Alcohol abuse   Hypoxic Respiratory failure, acute ([place])   Acute hypoxic respiratory failure likely 2/2 ?Bronchitis Vs HFpEF with underlying moderate pulmonary  hypertension Currently afebrile, with no leukocytosis Currently on 2 L of O2, plan to wean off BNP greater than 1000, troponin negative, EKG with no acute ST changes Chest x-ray unremarkable Patient on multiple cardiac medication, ?? Compliance Cardiology consulted, spoke to Dr. Marlou Porch, recommended IV diuretics for now and would see patient on 08/31/2019 Continue IV Lasix Continue benazepril, Coreg, hydralazine/Imdur, hold Aldactone Continue p.o. doxycycline, bronchodilators/mucolytics Continue supplemental oxygen as needed Home O2 evaluation Strict I's and O's, daily weight  Nonobstructive CAD/pulmonary hypertension Currently chest pain-free LHC/RHC 03/16/19: Normal RA pressure, mildly elevated PCWP, Mod pulmonary HTN, PVR not significantly elevated at 2.83, suggesting primarily pulmonary venous hypertension. Nonobstructive coronary disease. EF- 50 % Continue meds as above, Crestor, aspirin  Hypertension History of resistant hypertension, renal artery Dopplers from 03/01/2019 was unremarkable ?? Compliance Continue amlodipine, benazepril, Coreg, clonidine, Cardura, hydralazine/Imdur  Alcohol abuse Advised to quit CIWA protocol  Thrombocytopenia Likely 2/2 alcohol use Daily CBC          Malnutrition Type:      Malnutrition Characteristics:      Nutrition Interventions:       Estimated body mass index is 28.63 kg/m as calculated from the following:   Height as of this encounter: 6\' 1"  (1.854 m).   Weight as of this encounter: 98.4 kg.     Code Status: Full  Family Communication: None at bedside  Disposition Plan: To be determined   Consultants:  Cardiology  Procedures:  None  Antimicrobials:  Doxycycline  DVT prophylaxis: SCDs, Lovenox   Objective: Vitals:   08/29/19 2348 08/30/19 0550 08/30/19 0816 08/30/19 0929  BP: 135/87 (!) 150/91  (!) 146/94  Pulse: (!) 59 (!) 58 68 68  Resp:  20 18 17    Temp: 98.3 F (36.8 C) 98 F (36.7 C)     TempSrc: Oral Oral    SpO2: 100% 98% 98% 95%  Weight:  98.4 kg    Height:        Intake/Output Summary (Last 24 hours) at 08/30/2019 1145 Last data filed at 08/30/2019 0500 Gross per 24 hour  Intake 720 ml  Output 1285 ml  Net -565 ml   Filed Weights   08/29/19 0812 08/30/19 0550  Weight: 102.1 kg 98.4 kg    Exam:  General: NAD   Cardiovascular: S1, S2 present  Respiratory:  Diminished breath sounds bilaterally  Abdomen: Soft, nontender, nondistended, bowel sounds present  Musculoskeletal: No bilateral pedal edema noted  Skin: Normal  Psychiatry: Normal mood   Data Reviewed: CBC: Recent Labs  Lab 08/29/19 0926  WBC 4.4  NEUTROABS 3.0  HGB 13.8  HCT 41.7  MCV 95.4  PLT 123456*   Basic Metabolic Panel: Recent Labs  Lab 08/29/19 0926 08/30/19 0312  NA 141  --   K 3.9  --   CL 105  --   CO2 27  --   GLUCOSE 105*  --   BUN 14  --   CREATININE 1.23  --   CALCIUM 8.9  --   MG  --  1.8  PHOS  --  4.2   GFR: Estimated Creatinine Clearance: 83.8 mL/min (by C-G formula based on SCr of 1.23 mg/dL). Liver Function Tests: Recent Labs  Lab 08/29/19 0926  AST 27  ALT 38  ALKPHOS 53  BILITOT 1.2  PROT 7.3  ALBUMIN 3.9   No results for input(s): LIPASE, AMYLASE in the last 168 hours. No results for input(s): AMMONIA in the last 168 hours. Coagulation Profile: No results for input(s): INR, PROTIME in the last 168 hours. Cardiac Enzymes: No results for input(s): CKTOTAL, CKMB, CKMBINDEX, TROPONINI in the last 168 hours. BNP (last 3 results) Recent Labs    10/30/18 1535 11/06/18 1550 11/11/18 1145  PROBNP 595* 1,053* 564*   HbA1C: No results for input(s): HGBA1C in the last 72 hours. CBG: No results for input(s): GLUCAP in the last 168 hours. Lipid Profile: No results for input(s): CHOL, HDL, LDLCALC, TRIG, CHOLHDL, LDLDIRECT in the last 72 hours. Thyroid Function Tests: No results for input(s): TSH, T4TOTAL, FREET4, T3FREE, THYROIDAB in  the last 72 hours. Anemia Panel: No results for input(s): VITAMINB12, FOLATE, FERRITIN, TIBC, IRON, RETICCTPCT in the last 72 hours. Urine analysis:    Component Value Date/Time   COLORURINE YELLOW 01/19/2014 1841   APPEARANCEUR CLEAR 01/19/2014 1841   LABSPEC 1.020 11/13/2017 0915   PHURINE 5.5 11/13/2017 0915   GLUCOSEU NEGATIVE 11/13/2017 0915   HGBUR NEGATIVE 11/13/2017 0915   BILIRUBINUR negative 12/02/2018 1210   BILIRUBINUR neg 09/02/2018 1003   KETONESUR negative 12/02/2018 1210   KETONESUR NEGATIVE 11/13/2017 0915   PROTEINUR Negative 09/02/2018 1003   PROTEINUR NEGATIVE 11/13/2017 0915   UROBILINOGEN 0.2 12/02/2018 1210   UROBILINOGEN 0.2 11/13/2017 0915   NITRITE Negative 12/02/2018 1210   NITRITE neg 09/02/2018 1003   NITRITE NEGATIVE 11/13/2017 0915   LEUKOCYTESUR Negative 12/02/2018 1210   Sepsis Labs: @LABRCNTIP (procalcitonin:4,lacticidven:4)  ) Recent Results (from the past 240 hour(s))  SARS CORONAVIRUS 2 (TAT 6-24 HRS) Nasopharyngeal Nasopharyngeal Swab     Status: None   Collection Time: 08/29/19  1:04 PM   Specimen: Nasopharyngeal Swab  Result Value Ref Range Status   SARS Coronavirus 2 NEGATIVE NEGATIVE Final  Comment: (NOTE) SARS-CoV-2 target nucleic acids are NOT DETECTED. The SARS-CoV-2 RNA is generally detectable in upper and lower respiratory specimens during the acute phase of infection. Negative results do not preclude SARS-CoV-2 infection, do not rule out co-infections with other pathogens, and should not be used as the sole basis for treatment or other patient management decisions. Negative results must be combined with clinical observations, patient history, and epidemiological information. The expected result is Negative. Fact Sheet for Patients: SugarRoll.be Fact Sheet for Healthcare Providers: https://www.woods-mathews.com/ This test is not yet approved or cleared by the [place] FDA and   has been authorized for detection and/or diagnosis of SARS-CoV-2 by FDA under an Emergency Use Authorization (EUA). This EUA will remain  in effect (meaning this test can be used) for the duration of the COVID-19 declaration under Section 56 4(b)(1) of the Act, 21 U.S.C. section 360bbb-3(b)(1), unless the authorization is terminated or revoked sooner. Performed at [facility], [place] [address] [place], [place] [address]       Studies: No results found.  Scheduled Meds: . amLODipine  10 mg Oral Daily  . aspirin EC  81 mg Oral Daily  . benazepril  40 mg Oral Daily  . carvedilol  25 mg Oral BID  . cloNIDine  0.2 mg Oral BID  . doxazosin  6 mg Oral QHS  . doxycycline  100 mg Oral Q12H  . folic acid  1 mg Oral Daily  . furosemide  40 mg Intravenous Q12H  . gabapentin  300 mg Oral TID  . guaiFENesin  600 mg Oral BID  . hydrALAZINE  100 mg Oral TID  . isosorbide mononitrate  60 mg Oral Daily  . loratadine  10 mg Oral Daily  . LORazepam  0-4 mg Intravenous Q6H   Followed by  . [START ON 08/31/2019] LORazepam  0-4 mg Intravenous Q12H  . multivitamin with minerals  1 tablet Oral Daily  . potassium chloride SA  20 mEq Oral Daily  . rosuvastatin  10 mg Oral Daily  . thiamine  100 mg Oral Daily   Or  . thiamine  100 mg Intravenous Daily  . tiZANidine  2 mg Oral TID    Continuous Infusions:   LOS: 1 day     Alma Friendly, MD [facility]  If 7PM-7AM, please contact night-coverage [email or web address] 08/30/2019, 11:45 AM

## 2019-08-30 NOTE — Progress Notes (Signed)
Handoff report completed. Pt stable transferred to 1401. SRP, RN

## 2019-08-31 ENCOUNTER — Encounter (HOSPITAL_COMMUNITY): Payer: Medicaid Other | Admitting: Cardiology

## 2019-08-31 LAB — BASIC METABOLIC PANEL
Anion gap: 10 (ref 5–15)
BUN: 25 mg/dL — ABNORMAL HIGH (ref 6–20)
CO2: 26 mmol/L (ref 22–32)
Calcium: 9 mg/dL (ref 8.9–10.3)
Chloride: 102 mmol/L (ref 98–111)
Creatinine, Ser: 1.44 mg/dL — ABNORMAL HIGH (ref 0.61–1.24)
GFR calc Af Amer: >60 mL/min (ref >60)
GFR calc non Af Amer: 54 mL/min — ABNORMAL LOW (ref >60)
Glucose, Bld: 92 mg/dL (ref 70–99)
Potassium: 3.7 mmol/L (ref 3.5–5.1)
Sodium: 138 mmol/L (ref 135–145)

## 2019-08-31 LAB — CBC WITH DIFFERENTIAL/PLATELET
Abs Immature Granulocytes: 0.01 10*3/uL (ref 0.00–0.07)
Basophils Absolute: 0 10*3/uL (ref 0.0–0.1)
Basophils Relative: 1 %
Eosinophils Absolute: 0.3 10*3/uL (ref 0.0–0.5)
Eosinophils Relative: 10 %
HCT: 40 % (ref 39.0–52.0)
Hemoglobin: 13.3 g/dL (ref 13.0–17.0)
Immature Granulocytes: 0 %
Lymphocytes Relative: 34 %
Lymphs Abs: 1 10*3/uL (ref 0.7–4.0)
MCH: 31.7 pg (ref 26.0–34.0)
MCHC: 33.3 g/dL (ref 30.0–36.0)
MCV: 95.2 fL (ref 80.0–100.0)
Monocytes Absolute: 0.8 10*3/uL (ref 0.1–1.0)
Monocytes Relative: 28 %
Neutro Abs: 0.8 10*3/uL — ABNORMAL LOW (ref 1.7–7.7)
Neutrophils Relative %: 27 %
Platelets: 121 10*3/uL — ABNORMAL LOW (ref 150–400)
RBC: 4.2 MIL/uL — ABNORMAL LOW (ref 4.22–5.81)
RDW: 12.4 % (ref 11.5–15.5)
WBC: 2.9 10*3/uL — ABNORMAL LOW (ref 4.0–10.5)
nRBC: 0 % (ref 0.0–0.2)

## 2019-08-31 LAB — PATHOLOGIST SMEAR REVIEW

## 2019-08-31 MED ORDER — FUROSEMIDE 40 MG PO TABS
40.0000 mg | ORAL_TABLET | Freq: Two times a day (BID) | ORAL | Status: DC
  Administered 2019-08-31 – 2019-09-01 (×2): 40 mg via ORAL
  Filled 2019-08-31 (×2): qty 1

## 2019-08-31 NOTE — Addendum Note (Signed)
Encounter addended by: Orma Render, RPH-CPP on: 08/31/2019 10:53 AM  Actions taken: Delete clinical note

## 2019-08-31 NOTE — Progress Notes (Addendum)
Progress Note  Patient Name: Daniel Joseph Date of Encounter: 08/31/2019  Primary Cardiologist: Daniel Him, MD  CHF: Dr. Aundra Joseph  Subjective   Breathing back to normal. No chest pain.   Inpatient Medications    Scheduled Meds: . amLODipine  10 mg Oral Daily  . aspirin EC  81 mg Oral Daily  . benazepril  40 mg Oral Daily  . carvedilol  25 mg Oral BID  . cloNIDine  0.2 mg Oral BID  . doxazosin  6 mg Oral QHS  . doxycycline  100 mg Oral Q12H  . enoxaparin (LOVENOX) injection  40 mg Subcutaneous Q24H  . folic acid  1 mg Oral Daily  . furosemide  40 mg Intravenous BID  . gabapentin  300 mg Oral TID  . guaiFENesin  600 mg Oral BID  . hydrALAZINE  100 mg Oral TID  . isosorbide mononitrate  60 mg Oral Daily  . loratadine  10 mg Oral Daily  . LORazepam  0-4 mg Intravenous Q6H   Followed by  . LORazepam  0-4 mg Intravenous Q12H  . multivitamin with minerals  1 tablet Oral Daily  . potassium chloride SA  20 mEq Oral Daily  . Ensure Max Protein  11 oz Oral Q24H  . rosuvastatin  10 mg Oral Daily  . thiamine  100 mg Oral Daily   Or  . thiamine  100 mg Intravenous Daily  . tiZANidine  2 mg Oral TID   Continuous Infusions:  PRN Meds: albuterol, LORazepam **OR** LORazepam, ondansetron   Vital Signs    Vitals:   08/30/19 0929 08/30/19 1655 08/30/19 2154 08/31/19 0549  BP: (!) 146/94 134/89 130/87 (!) 148/101  Pulse: 68 60 64 (!) 57  Resp:   20 20  Temp:  98.7 F (37.1 C) 98.4 F (36.9 C) 98.3 F (36.8 C)  TempSrc:  Oral  Oral  SpO2: 95% 97% 97% 97%  Weight:      Height:       No intake or output data in the 24 hours ending 08/31/19 0816 Last 3 Weights 08/30/2019 08/29/2019 07/20/2019  Weight (lbs) 217 lb 225 lb 228 lb 3.2 oz  Weight (kg) 98.431 kg 102.059 kg 103.511 kg      Telemetry    NSR at rate of 60s - Personally Reviewed  ECG    No new tracing   Physical Exam   GEN: No acute distress.   Neck: No JVD Cardiac: RRR, no murmurs, rubs, or  gallops.  Respiratory: Clear to auscultation bilaterally. GI: Soft, nontender, non-distended  MS: No edema; No deformity. Neuro:  Nonfocal  Psych: Normal affect   Labs    High Sensitivity Troponin:   Recent Labs  Lab 08/29/19 1304 08/29/19 1502  TROPONINIHS 12 12      Chemistry Recent Labs  Lab 08/29/19 0926 08/30/19 1416 08/31/19 0532  NA 141 141 138  K 3.9 3.9 3.7  CL 105 102 102  CO2 27 29 26   GLUCOSE 105* 89 92  BUN 14 16 25*  CREATININE 1.23 1.81* 1.44*  CALCIUM 8.9 8.9 9.0  PROT 7.3  --   --   ALBUMIN 3.9  --   --   AST 27  --   --   ALT 38  --   --   ALKPHOS 53  --   --   BILITOT 1.2  --   --   GFRNONAA >60 41* 54*  GFRAA >60 48* >60  ANIONGAP 9 10  10     Hematology Recent Labs  Lab 08/29/19 0926 08/31/19 0532  WBC 4.4 2.9*  RBC 4.37 4.20*  HGB 13.8 13.3  HCT 41.7 40.0  MCV 95.4 95.2  MCH 31.6 31.7  MCHC 33.1 33.3  RDW 12.7 12.4  PLT 105* 121*    BNP Recent Labs  Lab 08/29/19 0926  BNP 1,054.2*     Radiology    DG Chest Portable 1 View  Result Date: 08/29/2019 CLINICAL DATA:  Shortness of breath, chest pain. EXAM: PORTABLE CHEST 1 VIEW COMPARISON:  Jan 19, 2015. FINDINGS: The heart size and mediastinal contours are within normal limits. Both lungs are clear. No pneumothorax or pleural effusion is noted. The visualized skeletal structures are unremarkable. IMPRESSION: No active disease. Electronically Signed   By: Daniel Joseph M.D.   On: 08/29/2019 09:45    Cardiac Studies   Non this admission   Patient Profile     Daniel Joseph is a 58 y.o. male with a hx of hypertension and dilated nonischemic cardiomyopathy who is being seen for the evaluation of acute heart failure at the request of Dr. Horris Joseph.  Assessment & Plan    1. Acute on chronic diastolic CHF - BNP [id number]. He thinks his breathing is back to normal. N&O not recorded. Weight down 8 lb. Scr improved today to 1.44 from 1.81 with diuresis. Continue IV lasix this  morning. Likely change to po PM/tomorrow and discharge later today or tomorrow.  - He was eating high salt diet. I have reviewed diatary salt restriction to 2gm/day and SSI lasix.  - Dr.Correll Joseph to see later today for final recommendations.   2. HTN - Continue antihypertensive regimen  3. Alcohol abuse - Encouraged cessation.   4. pHTN - Will reschedule appointment with Dr. Aundra Joseph   For questions or updates, please contact Daniel Joseph Please consult [email or web address] for contact info under        Signed, Daniel Kail, PA  08/31/2019, 8:16 AM    Personally seen and examined. Agree with above.   58 year old with acute diastolic/systolic heart failure. Good overall diuresis, I think it makes sense for [place] to continue with diuresis for 1 more day with IV Lasix and then p.o. tomorrow with discharge.  Breathing is better but not quite at baseline. Asked about taking a vitamin.  I explained that the trials have been neutral. Also asked about potential Covid vaccine in the future, I promoted.   Daniel Furbish, MD

## 2019-08-31 NOTE — Progress Notes (Signed)
PROGRESS NOTE  Daniel Joseph [id number] DOB: 10-05-1960 DOA: 08/29/2019 PCP: [name], MD  HPI/Recap of past 24 hours: HPI from Dr Fletcher Anon is a 58 y.o. 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 Also reported some mild cough, myalgias.  Of note, patient drinks up to 12 beers on a daily basis.  Patient had extensive cardiac work-up including LHC/RHC in 02/2019 and is scheduled to see cardiology on 08/31/2019.  In the ED, patient was found to be hypoxic with sats 89% on room air, serial troponins were negative, EKG with no acute ST changes, BNP greater than 1000, chest x-ray unremarkable.  Patient admitted for further management.    Today, patient still reports some shortness of breath, with some generalized weakness.  Patient denies any chest pain, nausea/vomiting, diarrhea, fever/chills.  -Patient's significant other is at bedside, patient admits to drinking up to 12 beers on a daily basis -Patient has had extensive cardiovascular work-up recently including LHC/RHC with findings as below:- 1. Normal RA pressure, mildly elevated PCWP.  2. Moderate pulmonary hypertension, PVR not significantly elevated at 2.83, suggesting primarily pulmonary venous hypertension.  3. Nonobstructive coronary disease.    12/15  Is doing fine.  His breathing fine on room air.  He denies any complaints.  No leg swelling, shortness of breath or chest pain.  12 point review of systems negative except for mentioned.  Assessment/Plan: Principal Problem:   Acute exacerbation of CHF (congestive heart failure) (HCC) Active Problems:   CKD (chronic kidney disease), stage III   HTN (hypertension)   Chronic combined systolic and diastolic CHF  (congestive heart failure) (HCC)   Alcohol abuse   Hypoxic Respiratory failure, acute ([place])   Acute hypoxic respiratory failure likely 2/2 ?Bronchitis Vs HFpEF with underlying moderate pulmonary hypertension Currently afebrile, with no leukocytosis Currently on 2 L of O2, plan to wean off BNP greater than 1000, troponin negative, EKG with no acute ST changes Chest x-ray unremarkable Patient on multiple cardiac medication, ?? Compliance Cardiology consulted, spoke to Dr. Marlou Porch, appreciate input.  Switch to oral Lasix.    Continue benazepril, Coreg, hydralazine/Imdur, hold Aldactone Continue p.o. doxycycline, bronchodilators/mucolytics Continue supplemental oxygen as needed Home O2 evaluation Strict I's and O's, daily weight  Nonobstructive CAD/pulmonary hypertension Currently chest pain-free LHC/RHC 03/16/19: Normal RA pressure, mildly elevated PCWP, Mod pulmonary HTN, PVR not significantly elevated at 2.83, suggesting primarily pulmonary venous hypertension. Nonobstructive coronary disease. EF- 50 % Continue meds as above, Crestor, aspirin  Hypertension History of resistant hypertension, renal artery Dopplers from 03/01/2019 was unremarkable ?? Compliance Continue amlodipine, benazepril, Coreg, clonidine, Cardura, hydralazine/Imdur  Alcohol abuse Advised to quit CIWA protocol  Thrombocytopenia Likely 2/2 alcohol use Daily CBC   Neutropenia - cx to monitor.  Check b12, ? If due to etoh abuse.          Malnutrition Type:  Nutrition Problem: Inadequate oral intake Etiology: poor appetite, nausea   Malnutrition Characteristics:  Signs/Symptoms: per patient/family report   Nutrition Interventions:  Interventions: (Ensure Max)    Estimated body mass index is 28.63 kg/m as calculated from the following:   Height as of this encounter: 6\' 1"  (1.854 m).   Weight as of this encounter: 98.4 kg.     Code Status: Full  Family  Communication: None at  bedside  Disposition Plan: To be determined   Consultants:  Cardiology  Procedures:  None  Antimicrobials:  Doxycycline  DVT prophylaxis: SCDs, Lovenox   Objective: Vitals:   08/30/19 1655 08/30/19 2154 08/31/19 0549 08/31/19 1428  BP: 134/89 130/87 (!) 148/101 128/71  Pulse: 60 64 (!) 57 65  Resp:  20 20 20   Temp: 98.7 F (37.1 C) 98.4 F (36.9 C) 98.3 F (36.8 C) 97.7 F (36.5 C)  TempSrc: Oral  Oral   SpO2: 97% 97% 97% 99%  Weight:      Height:        Intake/Output Summary (Last 24 hours) at 08/31/2019 1800 Last data filed at 08/31/2019 1535 Gross per 24 hour  Intake --  Output 150 ml  Net -150 ml   Filed Weights   08/29/19 0812 08/30/19 0550  Weight: 102.1 kg 98.4 kg    Exam:  General: NAD   Cardiovascular: S1, S2 present  Respiratory:  Diminished breath sounds bilaterally, minimal crackles heard   Abdomen: Soft, nontender, nondistended, bowel sounds present  Musculoskeletal: No bilateral pedal edema noted  Skin: Normal  Psychiatry: Normal mood   Data Reviewed: CBC: Recent Labs  Lab 08/29/19 0926 08/31/19 0532  WBC 4.4 2.9*  NEUTROABS 3.0 0.8*  HGB 13.8 13.3  HCT 41.7 40.0  MCV 95.4 95.2  PLT 105* [id number]*   Basic Metabolic Panel: Recent Labs  Lab 08/29/19 0926 08/30/19 0312 08/30/19 1416 08/31/19 0532  NA 141  --  141 138  K 3.9  --  3.9 3.7  CL 105  --  102 102  CO2 27  --  29 26  GLUCOSE 105*  --  89 92  BUN 14  --  16 25*  CREATININE 1.23  --  1.81* 1.44*  CALCIUM 8.9  --  8.9 9.0  MG  --  1.8  --   --   PHOS  --  4.2  --   --    GFR: Estimated Creatinine Clearance: 71.6 mL/min (A) (by C-G formula based on SCr of 1.44 mg/dL (H)). Liver Function Tests: Recent Labs  Lab 08/29/19 0926  AST 27  ALT 38  ALKPHOS 53  BILITOT 1.2  PROT 7.3  ALBUMIN 3.9   No results for input(s): LIPASE, AMYLASE in the last 168 hours. No results for input(s): AMMONIA in the last 168 hours. Coagulation Profile: No results for  input(s): INR, PROTIME in the last 168 hours. Cardiac Enzymes: No results for input(s): CKTOTAL, CKMB, CKMBINDEX, TROPONINI in the last 168 hours. BNP (last 3 results) Recent Labs    10/30/18 1535 11/06/18 1550 11/11/18 1145  PROBNP 595* 1,053* 564*   HbA1C: No results for input(s): HGBA1C in the last 72 hours. CBG: No results for input(s): GLUCAP in the last 168 hours. Lipid Profile: No results for input(s): CHOL, HDL, LDLCALC, TRIG, CHOLHDL, LDLDIRECT in the last 72 hours. Thyroid Function Tests: No results for input(s): TSH, T4TOTAL, FREET4, T3FREE, THYROIDAB in the last 72 hours. Anemia Panel: No results for input(s): VITAMINB12, FOLATE, FERRITIN, TIBC, IRON, RETICCTPCT in the last 72 hours. Urine analysis:    Component Value Date/Time   COLORURINE YELLOW 01/19/2014 1841   APPEARANCEUR CLEAR 01/19/2014 1841   LABSPEC 1.020 11/13/2017 0915   PHURINE 5.5 11/13/2017 0915   GLUCOSEU NEGATIVE 11/13/2017 0915   HGBUR NEGATIVE 11/13/2017 0915   BILIRUBINUR negative 12/02/2018 1210   BILIRUBINUR neg 09/02/2018 1003   KETONESUR negative 12/02/2018 1210   KETONESUR NEGATIVE  11/13/2017 0915   PROTEINUR Negative 09/02/2018 1003   PROTEINUR NEGATIVE 11/13/2017 0915   UROBILINOGEN 0.2 12/02/2018 1210   UROBILINOGEN 0.2 11/13/2017 0915   NITRITE Negative 12/02/2018 1210   NITRITE neg 09/02/2018 1003   NITRITE NEGATIVE 11/13/2017 0915   LEUKOCYTESUR Negative 12/02/2018 1210   Sepsis Labs: @LABRCNTIP (procalcitonin:4,lacticidven:4)  ) Recent Results (from the past 240 hour(s))  SARS CORONAVIRUS 2 (TAT 6-24 HRS) Nasopharyngeal Nasopharyngeal Swab     Status: None   Collection Time: 08/29/19  1:04 PM   Specimen: Nasopharyngeal Swab  Result Value Ref Range Status   SARS Coronavirus 2 NEGATIVE NEGATIVE Final    Comment: (NOTE) SARS-CoV-2 target nucleic acids are NOT DETECTED. The SARS-CoV-2 RNA is generally detectable in upper and lower respiratory specimens during the acute  phase of infection. Negative results do not preclude SARS-CoV-2 infection, do not rule out co-infections with other pathogens, and should not be used as the sole basis for treatment or other patient management decisions. Negative results must be combined with clinical observations, patient history, and epidemiological information. The expected result is Negative. Fact Sheet for Patients: SugarRoll.be Fact Sheet for Healthcare Providers: https://www.woods-mathews.com/ This test is not yet approved or cleared by the [place] FDA and  has been authorized for detection and/or diagnosis of SARS-CoV-2 by FDA under an Emergency Use Authorization (EUA). This EUA will remain  in effect (meaning this test can be used) for the duration of the COVID-19 declaration under Section 56 4(b)(1) of the Act, 21 U.S.C. section 360bbb-3(b)(1), unless the authorization is terminated or revoked sooner. Performed at [facility], [place] [address] [place], [place] [address]       Studies: No results found.  Scheduled Meds: . amLODipine  10 mg Oral Daily  . aspirin EC  81 mg Oral Daily  . benazepril  40 mg Oral Daily  . carvedilol  25 mg Oral BID  . cloNIDine  0.2 mg Oral BID  . doxazosin  6 mg Oral QHS  . doxycycline  100 mg Oral Q12H  . enoxaparin (LOVENOX) injection  40 mg Subcutaneous Q24H  . folic acid  1 mg Oral Daily  . furosemide  40 mg Oral BID  . gabapentin  300 mg Oral TID  . guaiFENesin  600 mg Oral BID  . hydrALAZINE  100 mg Oral TID  . isosorbide mononitrate  60 mg Oral Daily  . loratadine  10 mg Oral Daily  . LORazepam  0-4 mg Intravenous Q6H   Followed by  . LORazepam  0-4 mg Intravenous Q12H  . multivitamin with minerals  1 tablet Oral Daily  . potassium chloride SA  20 mEq Oral Daily  . Ensure Max Protein  11 oz Oral Q24H  . rosuvastatin  10 mg Oral Daily  . thiamine  100 mg Oral Daily  . tiZANidine  2 mg Oral TID     Continuous Infusions:   LOS: 2 days     Kiyana Vazguez Harmon Pier, MD [facility]  If 7PM-7AM, please contact night-coverage [email or web address] 08/31/2019, 6:00 PM

## 2019-09-01 DIAGNOSIS — D696 Thrombocytopenia, unspecified: Secondary | ICD-10-CM

## 2019-09-01 LAB — CBC WITH DIFFERENTIAL/PLATELET
Abs Immature Granulocytes: 0 10*3/uL (ref 0.00–0.07)
Basophils Absolute: 0 10*3/uL (ref 0.0–0.1)
Basophils Relative: 0 %
Eosinophils Absolute: 0.3 10*3/uL (ref 0.0–0.5)
Eosinophils Relative: 9 %
HCT: 41 % (ref 39.0–52.0)
Hemoglobin: 13.6 g/dL (ref 13.0–17.0)
Immature Granulocytes: 0 %
Lymphocytes Relative: 35 %
Lymphs Abs: 0.9 10*3/uL (ref 0.7–4.0)
MCH: 31.9 pg (ref 26.0–34.0)
MCHC: 33.2 g/dL (ref 30.0–36.0)
MCV: 96 fL (ref 80.0–100.0)
Monocytes Absolute: 0.7 10*3/uL (ref 0.1–1.0)
Monocytes Relative: 27 %
Neutro Abs: 0.8 10*3/uL — ABNORMAL LOW (ref 1.7–7.7)
Neutrophils Relative %: 29 %
Platelets: 132 10*3/uL — ABNORMAL LOW (ref 150–400)
RBC: 4.27 MIL/uL (ref 4.22–5.81)
RDW: 12.3 % (ref 11.5–15.5)
WBC: 2.7 10*3/uL — ABNORMAL LOW (ref 4.0–10.5)
nRBC: 0 % (ref 0.0–0.2)

## 2019-09-01 LAB — BASIC METABOLIC PANEL
Anion gap: 11 (ref 5–15)
BUN: 24 mg/dL — ABNORMAL HIGH (ref 6–20)
CO2: 22 mmol/L (ref 22–32)
Calcium: 8.9 mg/dL (ref 8.9–10.3)
Chloride: 104 mmol/L (ref 98–111)
Creatinine, Ser: 1.36 mg/dL — ABNORMAL HIGH (ref 0.61–1.24)
GFR calc Af Amer: >60 mL/min (ref >60)
GFR calc non Af Amer: 58 mL/min — ABNORMAL LOW (ref >60)
Glucose, Bld: 120 mg/dL — ABNORMAL HIGH (ref 70–99)
Potassium: 3.5 mmol/L (ref 3.5–5.1)
Sodium: 137 mmol/L (ref 135–145)

## 2019-09-01 LAB — MAGNESIUM: Magnesium: 1.8 mg/dL (ref 1.7–2.4)

## 2019-09-01 LAB — VITAMIN B12: Vitamin B-12: 350 pg/mL (ref 180–914)

## 2019-09-01 MED ORDER — DOCUSATE SODIUM 100 MG PO CAPS: 100.0000 mg | ORAL_CAPSULE | Freq: Every day | ORAL | 0 refills | Status: DC

## 2019-09-01 MED ORDER — CARVEDILOL 12.5 MG PO TABS: 12.5000 mg | ORAL_TABLET | Freq: Two times a day (BID) | ORAL | 3 refills | Status: DC

## 2019-09-01 MED ORDER — ENSURE MAX PROTEIN PO LIQD: 11.0000 [oz_av] | ORAL | 3 refills | Status: DC

## 2019-09-01 MED ORDER — THIAMINE HCL 100 MG PO TABS: 100.0000 mg | ORAL_TABLET | Freq: Every day | ORAL | 0 refills | Status: DC

## 2019-09-01 MED ORDER — DOCUSATE SODIUM 100 MG PO CAPS
100.0000 mg | ORAL_CAPSULE | Freq: Every day | ORAL | Status: DC
  Filled 2019-09-01: qty 1

## 2019-09-01 MED ORDER — ADULT MULTIVITAMIN W/MINERALS CH: 1.0000 | ORAL_TABLET | Freq: Every day | ORAL | 0 refills | Status: AC

## 2019-09-01 MED ORDER — SENNOSIDES-DOCUSATE SODIUM 8.6-50 MG PO TABS
1.0000 | ORAL_TABLET | Freq: Every evening | ORAL | Status: DC | PRN
  Administered 2019-09-01: 04:00:00 1 via ORAL
  Filled 2019-09-01: qty 1

## 2019-09-01 MED ORDER — DOXYCYCLINE HYCLATE 100 MG PO TABS: 100.0000 mg | ORAL_TABLET | Freq: Two times a day (BID) | ORAL | 0 refills | Status: AC

## 2019-09-01 NOTE — Discharge Summary (Signed)
Physician Discharge Summary  Daniel Joseph [id number] DOB: [birth year] DOA: 08/29/2019  PCP: [name], MD  Admit date: 08/29/2019 Discharge date: 09/01/2019  Admitted From:   Disposition:    Recommendations for Outpatient Follow-up:  1. Follow up with PCP in 1-2 weeks 2. Please obtain BMP/CBC in one week 3. 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 Also reported some mild cough, myalgias.  Of note, patient drinks up to 12 beers on a daily basis.  Patient had extensive cardiac work-up including LHC/RHC in 02/2019 and is scheduled to see cardiology on 08/31/2019.  In the ED, patient was found to be hypoxic with sats 89% on room air, serial troponins were negative, EKG with no acute ST changes, BNP greater than 1000, chest x-ray unremarkable.  Patient admitted for further management.   -Patient's significant other is at bedside, patient admits to drinking up to 12 beers on a daily basis -Patient has had extensive cardiovascular work-up recently includingLHC/RHC with findings as below:- 1. Normal RA pressure, mildly elevated PCWP.  2. Moderate pulmonary hypertension, PVR not significantly elevated at 2.83, suggesting primarily pulmonary venous hypertension.  3. Nonobstructive coronary disease.  Patient was admitted and treated with IV Lasix to which she responded well.  Also doxycycline was started for suspected bronchitis.  Over the next 2 3 days, his symptoms resolved and he was breathing fine on room  air.  Cardiology consulted and recommended medical management.  His heart rate stayed in 50s and so carvedilol dose has been reduced from 25mg  to 12.5 mg.  Aldactone has been restarted at 50 mg.  Potassium is a little bit on the lower side, will hold off any additional supplement due to adding spironolactone.  B12 level is within normal limits around 350.  Patient has been strongly advised to comply with diet including less than 4 g of sodium and 1500 cc of fluid restriction along with refraining from alcohol.  On December 16, he was feeling fine and was felt to be at baseline, so he was discharged home to follow-up with PCP and hematology for his neutropenia.  He has also been advised about risk to dose of Lasix for shortness of breath  During his stay ,   Acute hypoxic respiratory failure likely 2/2 ?Bronchitis Vs HFpEF with underlying moderate pulmonary hypertension Currently afebrile, with no leukocytosis Currently on 2 L of O2, plan to wean off BNP greater than 1000, troponin negative, EKG with no acute ST changes Chest x-ray unremarkable Patient on multiple cardiac medication, ?? [facility] Cardiology consulted, spoke to Dr. Marlou Porch, appreciate input.  Switch to oral Lasix.    Continue benazepril, Coreg, hydralazine/Imdur, hold Aldactone Continue p.o. doxycycline, bronchodilators/mucolytics Continue supplemental oxygen as needed Home O2 evaluation Strict I's and O's, daily weight  Nonobstructive CAD/pulmonary hypertension Currently chest pain-free LHC/RHC 03/16/19: Normal RA pressure, mildly elevated PCWP, Mod pulmonary HTN, PVR not significantly elevated at 2.83, suggesting primarily pulmonary venous hypertension. Nonobstructive coronary disease. EF- 50 % Continue meds as above, Crestor, aspirin  Hypertension History of resistant hypertension, renal artery Dopplers from 03/01/2019 was unremarkable ?? Compliance Continue  amlodipine, benazepril, Coreg, clonidine, Cardura,  hydralazine/Imdur  Alcohol abuse Advised to quit CIWA protocol  Thrombocytopenia Likely 2/2 alcohol use Daily CBC   Neutropenia - cx to monitor.  Check b12, ? If due to etoh abuse.        Malnutrition Type:  Nutrition Problem: Inadequate oral intake Etiology: poor appetite, nausea   Discharge Diagnoses:  Principal Problem:   Acute exacerbation of CHF (congestive heart failure) (HCC) Active Problems:   CKD (chronic kidney disease), stage III   HTN (hypertension)   Chronic combined systolic and diastolic CHF (congestive heart failure) (HCC)   Alcohol abuse   Hypoxic Respiratory failure, acute (HCC)  Acute bronchitis  Etoh abuse   Discharge Instructions  Discharge Instructions    Diet - low sodium heart healthy   Complete by: As directed    1500 cc fluid restricition. resucue dose of an extra lasix pill in am if shortness of breath or weight gain by 4 pounds   Increase activity slowly   Complete by: As directed      Allergies as of 09/01/2019   No Known Allergies     Medication List    STOP taking these medications   predniSONE 10 MG tablet Commonly known as: DELTASONE     TAKE these medications   acetaminophen 650 MG CR tablet Commonly known as: TYLENOL Take 1,300 mg by mouth every 8 (eight) hours as needed for pain.   allopurinol 100 MG tablet Commonly known as: ZYLOPRIM Take 1 tablet (100 mg total) by mouth 2 (two) times daily.   amLODipine 10 MG tablet Commonly known as: NORVASC Take 1 tablet (10 mg total) by mouth daily.   aspirin EC 81 MG tablet Take 1 tablet (81 mg total) by mouth daily.   benazepril 40 MG tablet Commonly known as: LOTENSIN Take 1 tablet (40 mg total) by mouth daily.   carvedilol 12.5 MG tablet Commonly known as: COREG Take 1 tablet (12.5 mg total) by mouth 2 (two) times daily. What changed:   medication strength  how much to take   cetirizine 10 MG tablet Commonly known as: ZYRTEC TAKE 1 TABLET  (10 MG TOTAL) BY MOUTH DAILY. What changed:   when to take this  reasons to take this   cloNIDine 0.2 MG tablet Commonly known as: CATAPRES Take 1 tablet (0.2 mg total) by mouth 2 (two) times daily.   diclofenac Sodium 1 % Gel Commonly known as: VOLTAREN Apply 4 g topically 4 (four) times daily as needed (sciatic nerve).   docusate sodium 100 MG capsule Commonly known as: COLACE Take 1 capsule (100 mg total) by mouth daily. Start taking on: September 02, 2019   doxazosin 4 MG tablet Commonly known as: CARDURA TAKE 1.5 TABLETS (6 MG TOTAL) BY MOUTH AT BEDTIME.   doxycycline 100 MG tablet Commonly known as: VIBRA-TABS Take 1 tablet (100 mg total) by mouth every 12 (twelve) hours for 4 days.   Dulcolax 5 MG EC tablet Generic drug: bisacodyl Take 1 tablet (5 mg total) by mouth daily as needed for moderate constipation.   Ensure Max Protein Liqd Take 330 mLs (11 oz total) by mouth daily.   furosemide 40 MG tablet Commonly known as: LASIX Take 40 mg by mouth 2 (two) times daily.   gabapentin 300 MG capsule Commonly known as: NEURONTIN Take 1 capsule (300 mg total) by mouth 3 (three) times daily.   hydrALAZINE 50 MG tablet Commonly known as: APRESOLINE Take 100 mg by mouth 3 (three)  times daily.   isosorbide mononitrate 60 MG 24 hr tablet Commonly known as: IMDUR Take 1 tablet (60 mg total) by mouth daily.   multivitamin with minerals Tabs tablet Take 1 tablet by mouth daily. Start taking on: September 02, 2019   ondansetron 8 MG disintegrating tablet Commonly known as: Zofran ODT Take 1 tablet (8 mg total) by mouth every 8 (eight) hours as needed for nausea or vomiting.   polyethylene glycol powder 17 GM/SCOOP powder Commonly known as: GLYCOLAX/MIRALAX Take 255 g by mouth as directed. What changed:   how much to take  when to take this  reasons to take this   potassium chloride SA 20 MEQ tablet Commonly known as: KLOR-CON Take 1 tablet (20 mEq total) by  mouth daily.   rosuvastatin 10 MG tablet Commonly known as: CRESTOR Take 1 tablet (10 mg total) by mouth daily.   spironolactone 50 MG tablet Commonly known as: ALDACTONE Take 1 tablet (50 mg total) by mouth daily.   thiamine 100 MG tablet Take 1 tablet (100 mg total) by mouth daily. Start taking on: September 02, 2019   tizanidine 2 MG capsule Commonly known as: ZANAFLEX Take 2 mg by mouth 3 (three) times daily.   traMADol 50 MG tablet Commonly known as: ULTRAM Take 50 mg by mouth every 8 (eight) hours as needed for moderate pain.      Follow-up Information    Larey Dresser, MD. Go on 09/08/2019.   Specialty: Cardiology Why: @11 :30 for hospital follow up  Contact information: [place] [address] [phone]          No Known Allergies  Consultations:     Procedures/Studies: DG Chest Portable 1 View  Result Date: 08/29/2019 CLINICAL DATA:  Shortness of breath, chest pain. EXAM: PORTABLE CHEST 1 VIEW COMPARISON:  Jan 19, 2015. FINDINGS: The heart size and mediastinal contours are within normal limits. Both lungs are clear. No pneumothorax or pleural effusion is noted. The visualized skeletal structures are unremarkable. IMPRESSION: No active disease. Electronically Signed   By: Marijo Conception M.D.   On: 08/29/2019 09:45       Subjective:   Discharge Exam: Vitals:   08/31/19 2110 09/01/19 0545  BP: (!) 141/94 (!) 145/100  Pulse: 61 (!) 58  Resp:  18  Temp:  98.6 F (37 C)  SpO2:  99%   Vitals:   08/31/19 1428 08/31/19 2109 08/31/19 2110 09/01/19 0545  BP: 128/71 (!) 151/101 (!) 141/94 (!) 145/100  Pulse: 65 61 61 (!) 58  Resp: 20 18  18   Temp: 97.7 F (36.5 C) 98.3 F (36.8 C)  98.6 F (37 C)  TempSrc:      SpO2: 99% 99%  99%  Weight:    100.4 kg  Height:        General: Pt is alert, awake, not in acute distress Cardiovascular: RRR, S1/S2 +, no rubs, no gallops Respiratory: CTA bilaterally, no wheezing, no  rhonchi Abdominal: Soft, NT, ND, bowel sounds + Extremities: no edema, no cyanosis    The results of significant diagnostics from this hospitalization (including imaging, microbiology, ancillary and laboratory) are listed below for reference.     Microbiology: Recent Results (from the past 240 hour(s))  SARS CORONAVIRUS 2 (TAT 6-24 HRS) Nasopharyngeal Nasopharyngeal Swab     Status: None   Collection Time: 08/29/19  1:04 PM   Specimen: Nasopharyngeal Swab  Result Value Ref Range Status   SARS Coronavirus 2 NEGATIVE NEGATIVE  Final    Comment: (NOTE) SARS-CoV-2 target nucleic acids are NOT DETECTED. The SARS-CoV-2 RNA is generally detectable in upper and lower respiratory specimens during the acute phase of infection. Negative results do not preclude SARS-CoV-2 infection, do not rule out co-infections with other pathogens, and should not be used as the sole basis for treatment or other patient management decisions. Negative results must be combined with clinical observations, patient history, and epidemiological information. The expected result is Negative. Fact Sheet for Patients: SugarRoll.be Fact Sheet for Healthcare Providers: https://www.woods-mathews.com/ This test is not yet approved or cleared by the [place] FDA and  has been authorized for detection and/or diagnosis of SARS-CoV-2 by FDA under an Emergency Use Authorization (EUA). This EUA will remain  in effect (meaning this test can be used) for the duration of the COVID-19 declaration under Section 56 4(b)(1) of the Act, 21 U.S.C. section 360bbb-3(b)(1), unless the authorization is terminated or revoked sooner. Performed at [facility], [place] [address] [place], [place] [address]      Labs: BNP (last 3 results) Recent Labs    08/29/19 0926  BNP [id number]*   Basic Metabolic Panel: Recent Labs  Lab 08/29/19 0926 08/30/19 0312 08/30/19 1416 08/31/19 0532  09/01/19 0541  NA 141  --  141 138 137  K 3.9  --  3.9 3.7 3.5  CL 105  --  102 102 104  CO2 27  --  29 26 22   GLUCOSE 105*  --  89 92 120*  BUN 14  --  16 25* 24*  CREATININE 1.23  --  1.81* 1.44* 1.36*  CALCIUM 8.9  --  8.9 9.0 8.9  MG  --  1.8  --   --  1.8  PHOS  --  4.2  --   --   --    Liver Function Tests: Recent Labs  Lab 08/29/19 0926  AST 27  ALT 38  ALKPHOS 53  BILITOT 1.2  PROT 7.3  ALBUMIN 3.9   No results for input(s): LIPASE, AMYLASE in the last 168 hours. No results for input(s): AMMONIA in the last 168 hours. CBC: Recent Labs  Lab 08/29/19 0926 08/31/19 0532 09/01/19 0541  WBC 4.4 2.9* 2.7*  NEUTROABS 3.0 0.8* 0.8*  HGB 13.8 13.3 13.6  HCT 41.7 40.0 41.0  MCV 95.4 95.2 96.0  PLT 105* 121* 132*   Cardiac Enzymes: No results for input(s): CKTOTAL, CKMB, CKMBINDEX, TROPONINI in the last 168 hours. BNP: Invalid input(s): POCBNP CBG: No results for input(s): GLUCAP in the last 168 hours. D-Dimer No results for input(s): DDIMER in the last 72 hours. Hgb A1c No results for input(s): HGBA1C in the last 72 hours. Lipid Profile No results for input(s): CHOL, HDL, LDLCALC, TRIG, CHOLHDL, LDLDIRECT in the last 72 hours. Thyroid function studies No results for input(s): TSH, T4TOTAL, T3FREE, THYROIDAB in the last 72 hours.  Invalid input(s): FREET3 Anemia work up Recent Labs    09/01/19 0541  VITAMINB12 350   Urinalysis    Component Value Date/Time   COLORURINE YELLOW 01/19/2014 1841   APPEARANCEUR CLEAR 01/19/2014 1841   LABSPEC 1.020 11/13/2017 0915   PHURINE 5.5 11/13/2017 0915   GLUCOSEU NEGATIVE 11/13/2017 0915   HGBUR NEGATIVE 11/13/2017 0915   BILIRUBINUR negative 12/02/2018 1210   BILIRUBINUR neg 09/02/2018 1003   KETONESUR negative 12/02/2018 1210   KETONESUR NEGATIVE 11/13/2017 0915   PROTEINUR Negative 09/02/2018 1003   PROTEINUR NEGATIVE 11/13/2017 0915   UROBILINOGEN 0.2 12/02/2018 1210  UROBILINOGEN 0.2 11/13/2017 0915    NITRITE Negative 12/02/2018 1210   NITRITE neg 09/02/2018 1003   NITRITE NEGATIVE 11/13/2017 0915   LEUKOCYTESUR Negative 12/02/2018 1210   Sepsis Labs Invalid input(s): PROCALCITONIN,  WBC,  LACTICIDVEN Microbiology Recent Results (from the past 240 hour(s))  SARS CORONAVIRUS 2 (TAT 6-24 HRS) Nasopharyngeal Nasopharyngeal Swab     Status: None   Collection Time: 08/29/19  1:04 PM   Specimen: Nasopharyngeal Swab  Result Value Ref Range Status   SARS Coronavirus 2 NEGATIVE NEGATIVE Final    Comment: (NOTE) SARS-CoV-2 target nucleic acids are NOT DETECTED. The SARS-CoV-2 RNA is generally detectable in upper and lower respiratory specimens during the acute phase of infection. Negative results do not preclude SARS-CoV-2 infection, do not rule out co-infections with other pathogens, and should not be used as the sole basis for treatment or other patient management decisions. Negative results must be combined with clinical observations, patient history, and epidemiological information. The expected result is Negative. Fact Sheet for Patients: SugarRoll.be Fact Sheet for Healthcare Providers: https://www.woods-mathews.com/ This test is not yet approved or cleared by the [place] FDA and  has been authorized for detection and/or diagnosis of SARS-CoV-2 by FDA under an Emergency Use Authorization (EUA). This EUA will remain  in effect (meaning this test can be used) for the duration of the COVID-19 declaration under Section 56 4(b)(1) of the Act, 21 U.S.C. section 360bbb-3(b)(1), unless the authorization is terminated or revoked sooner. Performed at [facility], [place] [address] [place], [place] [address]      Time coordinating discharge:  Over 30 mins   SIGNED:   Vicenta Dunning, MD  [facility] 09/01/2019, 11:37 AM Pager   If 7PM-7AM, please contact night-coverage [email or web address] Password [email or web address]

## 2019-09-01 NOTE — Progress Notes (Addendum)
Progress Note  Patient Name: Daniel Joseph Date of Encounter: 09/01/2019  Primary Cardiologist: Fransico Him, MD  CHF: Dr.McLean  Subjective   Breathing is much improved. No chest pain.   Inpatient Medications    Scheduled Meds: . amLODipine  10 mg Oral Daily  . aspirin EC  81 mg Oral Daily  . benazepril  40 mg Oral Daily  . carvedilol  25 mg Oral BID  . cloNIDine  0.2 mg Oral BID  . docusate sodium  100 mg Oral Daily  . doxazosin  6 mg Oral QHS  . doxycycline  100 mg Oral Q12H  . enoxaparin (LOVENOX) injection  40 mg Subcutaneous Q24H  . folic acid  1 mg Oral Daily  . furosemide  40 mg Oral BID  . gabapentin  300 mg Oral TID  . guaiFENesin  600 mg Oral BID  . hydrALAZINE  100 mg Oral TID  . isosorbide mononitrate  60 mg Oral Daily  . loratadine  10 mg Oral Daily  . LORazepam  0-4 mg Intravenous Q12H  . multivitamin with minerals  1 tablet Oral Daily  . potassium chloride SA  20 mEq Oral Daily  . Ensure Max Protein  11 oz Oral Q24H  . rosuvastatin  10 mg Oral Daily  . thiamine  100 mg Oral Daily  . tiZANidine  2 mg Oral TID   Continuous Infusions:  PRN Meds: albuterol, LORazepam **OR** LORazepam, ondansetron, senna-docusate   Vital Signs    Vitals:   08/31/19 1428 08/31/19 2109 08/31/19 2110 09/01/19 0545  BP: 128/71 (!) 151/101 (!) 141/94 (!) 145/100  Pulse: 65 61 61 (!) 58  Resp: 20 18  18   Temp: 97.7 F (36.5 C) 98.3 F (36.8 C)  98.6 F (37 C)  TempSrc:      SpO2: 99% 99%  99%  Weight:    100.4 kg  Height:        Intake/Output Summary (Last 24 hours) at 09/01/2019 0845 Last data filed at 09/01/2019 0400 Gross per 24 hour  Intake 240 ml  Output 1150 ml  Net -910 ml   Last 3 Weights 09/01/2019 08/30/2019 08/29/2019  Weight (lbs) 221 lb 4.8 oz 217 lb 225 lb  Weight (kg) 100.381 kg 98.431 kg 102.059 kg      Telemetry    NSR at controlled rate  - Personally Reviewed  ECG    N/A  Physical Exam   GEN: No acute distress.   Neck:  No JVD Cardiac: RRR, no murmurs, rubs, or gallops.  Respiratory: Clear to auscultation bilaterally. GI: Soft, nontender, non-distended  MS: No edema; No deformity. Neuro:  Nonfocal  Psych: Normal affect   Labs    High Sensitivity Troponin:   Recent Labs  Lab 08/29/19 1304 08/29/19 1502  TROPONINIHS 12 12      Chemistry Recent Labs  Lab 08/29/19 0926 08/30/19 1416 08/31/19 0532 09/01/19 0541  NA 141 141 138 137  K 3.9 3.9 3.7 3.5  CL 105 102 102 104  CO2 27 29 26 22   GLUCOSE 105* 89 92 120*  BUN 14 16 25* 24*  CREATININE 1.23 1.81* 1.44* 1.36*  CALCIUM 8.9 8.9 9.0 8.9  PROT 7.3  --   --   --   ALBUMIN 3.9  --   --   --   AST 27  --   --   --   ALT 38  --   --   --   ALKPHOS 53  --   --   --  BILITOT 1.2  --   --   --   GFRNONAA >60 41* 54* 58*  GFRAA >60 48* >60 >60  ANIONGAP 9 10 10 11      Hematology Recent Labs  Lab 08/29/19 0926 08/31/19 0532 09/01/19 0541  WBC 4.4 2.9* 2.7*  RBC 4.37 4.20* 4.27  HGB 13.8 13.3 13.6  HCT 41.7 40.0 41.0  MCV 95.4 95.2 96.0  MCH 31.6 31.7 31.9  MCHC 33.1 33.3 33.2  RDW 12.7 12.4 12.3  PLT 105* 121* 132*    BNP Recent Labs  Lab 08/29/19 0926  BNP 1,054.2*      Radiology    No results found.  Cardiac Studies   N/A  Patient Profile     58 y.o. male with a hx of hypertension and dilated nonischemic cardiomyopathywho is being seen for the evaluation of acute heart failureat the request of Dr. Horris Latino.  Assessment & Plan    1. Acute on chronic diastolic CHF - BNP [id number]. Net I & O -1.5L (did not recorded output initially). Weight is up 4 lb from yesterday however overall down 4 lb since admit. He is feeling better. Feels breathing back to normal. Laying flat in bed. SCr improved with diuresis (1.81>>1.36 today). Continue Lasix 40mg  BID. Advised to take additional 20mg  lasix for dyspnea or edema. Discussed diet restriction again today. Likely discharge later today once seen by Dr. Marlou Porch.   Appointment  with Dr. [name] rescheduled to 12/23.  For questions or updates, please contact [place] Please consult [email or web address] for contact info under        SignedLeanor Kail, PA  09/01/2019, 8:45 AM

## 2019-09-08 ENCOUNTER — Encounter (HOSPITAL_COMMUNITY): Payer: Medicaid Other

## 2019-09-14 ENCOUNTER — Telehealth (HOSPITAL_COMMUNITY): Payer: Self-pay | Admitting: *Deleted

## 2019-09-14 NOTE — Telephone Encounter (Signed)
Pt called to r/s missed appt. Pt also called to report that after taking carvedilol 12.5 mg he is dizzy and has blurred vision.  Pt would like to decrease medication.  Pt states he knows it the carvedilol because symptoms start about an hour after taking his morning and evening dose.    Routed to [employer], [place] for advice

## 2019-09-14 NOTE — Telephone Encounter (Signed)
Left VM for pt to return call.

## 2019-09-15 NOTE — Telephone Encounter (Signed)
2nd attempt to reach pt. Called pt no answer.

## 2019-09-24 ENCOUNTER — Telehealth (INDEPENDENT_AMBULATORY_CARE_PROVIDER_SITE_OTHER): Payer: Medicaid Other | Admitting: Cardiology

## 2019-09-24 ENCOUNTER — Encounter: Payer: Self-pay | Admitting: Cardiology

## 2019-09-24 ENCOUNTER — Other Ambulatory Visit: Payer: Self-pay

## 2019-09-24 VITALS — BP 138/70 | HR 72 | Ht 73.0 in | Wt 217.0 lb

## 2019-09-24 DIAGNOSIS — I5042 Chronic combined systolic (congestive) and diastolic (congestive) heart failure: Secondary | ICD-10-CM | POA: Diagnosis not present

## 2019-09-24 DIAGNOSIS — I472 Ventricular tachycardia: Secondary | ICD-10-CM

## 2019-09-24 DIAGNOSIS — I2510 Atherosclerotic heart disease of native coronary artery without angina pectoris: Secondary | ICD-10-CM | POA: Diagnosis not present

## 2019-09-24 DIAGNOSIS — I10 Essential (primary) hypertension: Secondary | ICD-10-CM | POA: Diagnosis not present

## 2019-09-24 DIAGNOSIS — I340 Nonrheumatic mitral (valve) insufficiency: Secondary | ICD-10-CM | POA: Diagnosis not present

## 2019-09-24 DIAGNOSIS — I078 Other rheumatic tricuspid valve diseases: Secondary | ICD-10-CM

## 2019-09-24 DIAGNOSIS — I4729 Other ventricular tachycardia: Secondary | ICD-10-CM

## 2019-09-24 MED ORDER — AMLODIPINE BESYLATE 10 MG PO TABS: 10.0000 mg | ORAL_TABLET | Freq: Every day | ORAL | 4 refills | Status: DC

## 2019-09-24 NOTE — Patient Instructions (Signed)
Medication Instructions:  Your physician has recommended you make the following change in your medication:   Take amlodipine (Norvasc) 10 mg daily at lunchtime.   *If you need a refill on your cardiac medications before your next appointment, please call your pharmacy*  Follow-Up: At [facility], you and your health needs are our priority.  As part of our continuing mission to provide you with exceptional heart care, we have created designated Provider Care Teams.  These Care Teams include your primary Cardiologist (physician) and Advanced Practice Providers (APPs -  Physician Assistants and Nurse Practitioners) who all work together to provide you with the care you need, when you need it.  Follow-up with Dr. [name] as needed.

## 2019-09-24 NOTE — Progress Notes (Signed)
Virtual Visit via Telephone Note   This visit type was conducted due to national recommendations for restrictions regarding the COVID-19 Pandemic (e.g. social distancing) in an effort to limit this patient's exposure and mitigate transmission in our community.  Due to his co-morbid illnesses, this patient is at least at moderate risk for complications without adequate follow up.  This format is felt to be most appropriate for this patient at this time.  The patient did not have access to video technology/had technical difficulties with video requiring transitioning to audio format only (telephone).  All issues noted in this document were discussed and addressed.  No physical exam could be performed with this format.  Please refer to the patient's chart for his  consent to telehealth for [facility].  Evaluation Performed:  Follow-up visit  This visit type was conducted due to national recommendations for restrictions regarding the COVID-19 Pandemic (e.g. social distancing).  This format is felt to be most appropriate for this patient at this time.  All issues noted in this document were discussed and addressed.  No physical exam was performed (except for noted visual exam findings with Video Visits).  Please refer to the patient's chart (MyChart message for video visits and phone note for telephone visits) for the patient's consent to telehealth for [facility].  Date:  09/24/2019   ID:  Daniel Joseph, DOB 07-22-1961, MRN [id number]  Patient Location:  Home  Provider location:   [place]  PCP:  Daniel Garter, MD  Cardiologist:  Daniel Him, MD  Electrophysiologist:  None   Chief Complaint:  CHF  History of Present Illness:    Daniel Joseph is a 59 y.o. male who presents via audio/video conferencing for a telehealth visit today.    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  Cardiolite in 5/16 showed no ischemia but possible apical infarction.  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  PYP scan was read as equivocal in 5/20 but probably not suggestive of transthyretin amyloidosis.   TEE was done in 6/20 to assess severity of MR.  EF was 50% with mild mitral valve prolapse and moderate MR.  RHC/LHC showed nonobstructive CAD, pulmonary venous hypertension, relatively controlled filling pressures. 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  He is followed in AHF clinic. 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   He was recently admitted with acute CHF exacerbation and responded well to  IV lasix with a bump in his creatinine to 1.81 from 1.23.  He is here today for followup and is doing well.  He denies any chest pain or pressure, SOB, DOE, PND, orthopnea, LE edema, dizziness, palpitations or syncope. He is compliant with his meds and is tolerating meds with no SE.    The patient does not have symptoms concerning for COVID-19 infection (fever, chills, cough, or new shortness of breath).    Prior CV studies:   The following studies were reviewed today:  none  Past Medical History:  Diagnosis Date  . Benign essential HTN 01/19/2014   Renal Artery [place] 6/16:  No RAS, No AAA  . Cardiomyopathy,  dilated, nonischemic ([place])    EF 45-50% bye echo 08/2015 and EF 50-55% by echo 10/2017  . Chronic combined systolic and diastolic CHF (congestive heart failure) ([place]) 01/21/2014  . Echocardiogram 5.2020    Echo 01/2019: EF 40-45, mild LVH, Gr 3 DD (restrictive filling), normal RVSF, MR may be severe, mod TR, mobile nodule c/w fibroelastoma similar to  previous echo, PASP 79  . Gout   . Mitral valve regurgitation    mild to moderate MR with MVP of ANMVL by echo 2019  . PVC's (premature ventricular contractions)    nonsustained VT as well as PVCs - no ICD indicated due to EF 40-45%  . Sleep apnea    pending second sleep study   . 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 ABDOMINAL SURGERY  at birth  . BUBBLE STUDY  03/16/2019   Procedure: BUBBLE STUDY;  Surgeon: Larey Dresser, MD;  Location: [facility] ENDOSCOPY;  Service: Cardiovascular;;  . RIGHT/LEFT HEART CATH AND CORONARY ANGIOGRAPHY N/A 03/16/2019   Procedure: RIGHT/LEFT HEART CATH AND CORONARY ANGIOGRAPHY;  Surgeon: Larey Dresser, MD;  Location: [place] CV LAB;  Service: Cardiovascular;  Laterality: N/A;  . TEE WITHOUT CARDIOVERSION N/A 01/25/2014   Procedure: TRANSESOPHAGEAL ECHOCARDIOGRAM (TEE);  Surgeon: [name], MD;  Location: [place];  Service: Cardiovascular;  Laterality: N/A;  . TEE WITHOUT CARDIOVERSION N/A 09/14/2014   Procedure: TRANSESOPHAGEAL ECHOCARDIOGRAM (TEE);  Surgeon: Sueanne Margarita, MD;  Location: [place];  Service: Cardiovascular;  Laterality: N/A;  . TEE WITHOUT CARDIOVERSION N/A 03/16/2019   Procedure: TRANSESOPHAGEAL ECHOCARDIOGRAM (TEE);  Surgeon: Larey Dresser, MD;  Location: [facility] ENDOSCOPY;  Service: Cardiovascular;  Laterality: N/A;     Current Meds  Medication Sig  . acetaminophen (TYLENOL) 650 MG CR tablet Take 1,300 mg by mouth every 8 (eight) hours as needed for pain.  Marland Kitchen allopurinol (ZYLOPRIM) 100 MG tablet Take 1 tablet (100 mg total) by mouth 2 (two) times daily.  Marland Kitchen amLODipine (NORVASC) 10 MG tablet Take 1 tablet (10 mg total) by mouth daily.  Marland Kitchen aspirin EC 81 MG tablet Take 1 tablet (81 mg total) by mouth daily.  . benazepril (LOTENSIN) 40 MG tablet Take 1 tablet (40 mg total) by mouth daily.  . bisacodyl (DULCOLAX) 5 MG EC tablet Take 1 tablet (5 mg total) by  mouth daily as needed for moderate constipation.  . carvedilol (COREG) 12.5 MG tablet Take 1 tablet (12.5 mg total) by mouth 2 (two) times daily.  . cetirizine (ZYRTEC) 10 MG tablet TAKE 1 TABLET (10 MG TOTAL) BY MOUTH DAILY.  . cloNIDine (CATAPRES) 0.2 MG tablet Take 1 tablet (0.2 mg total) by mouth 2 (two) times daily.  . diclofenac Sodium (VOLTAREN) 1 % GEL Apply 4 g topically 4 (four) times daily as needed (sciatic nerve).  Marland Kitchen docusate sodium (COLACE) 100 MG capsule Take 1 capsule (100 mg total) by mouth daily.  Marland Kitchen doxazosin (CARDURA) 4 MG tablet TAKE 1.5 TABLETS (6 MG TOTAL) BY MOUTH AT BEDTIME.  Marland Kitchen Ensure Max Protein (ENSURE MAX PROTEIN) LIQD Take 330 mLs (11 oz total) by mouth daily.  . furosemide (LASIX) 40 MG tablet Take 40 mg by mouth 2 (two) times daily.  Marland Kitchen gabapentin (NEURONTIN) 300 MG capsule Take 1 capsule (300 mg total) by mouth 3 (three) times daily.  . hydrALAZINE (APRESOLINE) 50 MG tablet Take 100 mg by mouth 3 (three) times daily.  . isosorbide  mononitrate (IMDUR) 60 MG 24 hr tablet Take 1 tablet (60 mg total) by mouth daily.  . Multiple Vitamin (MULTIVITAMIN WITH MINERALS) TABS tablet Take 1 tablet by mouth daily.  . ondansetron (ZOFRAN ODT) 8 MG disintegrating tablet Take 1 tablet (8 mg total) by mouth every 8 (eight) hours as needed for nausea or vomiting.  . polyethylene glycol powder (GLYCOLAX/MIRALAX) 17 GM/SCOOP powder Take 255 g by mouth as directed.  . potassium chloride SA (K-DUR) 20 MEQ tablet Take 1 tablet (20 mEq total) by mouth daily.  . rosuvastatin (CRESTOR) 10 MG tablet Take 1 tablet (10 mg total) by mouth daily.  Marland Kitchen spironolactone (ALDACTONE) 50 MG tablet Take 1 tablet (50 mg total) by mouth daily.  Marland Kitchen thiamine 100 MG tablet Take 1 tablet (100 mg total) by mouth daily.  . tizanidine (ZANAFLEX) 2 MG capsule Take 2 mg by mouth 3 (three) times daily.  . traMADol (ULTRAM) 50 MG tablet Take 50 mg by mouth every 8 (eight) hours as needed for moderate pain.       Allergies:   Patient has no known allergies.   Social History   Tobacco Use  . Smoking status: Never Smoker  . Smokeless tobacco: Never Used  Substance Use Topics  . Alcohol use: No  . Drug use: No     Family Hx: The patient's family history includes Heart Problems in his brother, father, and mother; Heart attack in his brother and father; Heart failure in his mother; Hypertension in his brother, father, and mother; Stroke in his mother. There is no history of Colon cancer, Esophageal cancer, Stomach cancer, Colon polyps, or Rectal cancer.  ROS:   Please see the history of present illness.     All other systems reviewed and are negative.   Labs/Other Tests and Data Reviewed:    Recent Labs: 11/11/2018: NT-Pro BNP 564 08/29/2019: ALT 38; B Natriuretic Peptide 1,054.2 09/01/2019: BUN 24; Creatinine, Ser 1.36; Hemoglobin 13.6; Magnesium 1.8; Platelets 132; Potassium 3.5; Sodium 137   Recent Lipid Panel Lab Results  Component Value Date/Time   CHOL 150 05/27/2019 09:08 AM   CHOL 145 09/02/2018 09:50 AM   TRIG 75 05/27/2019 09:08 AM   HDL 68 05/27/2019 09:08 AM   HDL 54 09/02/2018 09:50 AM   CHOLHDL 2.2 05/27/2019 09:08 AM   LDLCALC 67 05/27/2019 09:08 AM   LDLCALC 75 09/02/2018 09:50 AM   LDLCALC 94 08/13/2017 08:48 AM    Wt Readings from Last 3 Encounters:  09/24/19 217 lb (98.4 kg)  09/01/19 221 lb 4.8 oz (100.4 kg)  07/20/19 228 lb 3.2 oz (103.5 kg)     Objective:    Vital Signs:  BP 138/70   Pulse 72   Ht 6\' 1"  (1.854 m)   Wt 217 lb (98.4 kg)   BMI 28.63 kg/m     ASSESSMENT & PLAN:    1.  Chronic combined systolic/diastolic CHF -he feels back to baseline since discharged from hospital for CHF exacerbation -his weight is down to 217lbs from 228lbs at last OV with Dr. [name] in November -he has no SOB or LE edema -continue spiro 50mg  daily, Imdur 60mg  daily, Hydralazine 100mg  TID, Lasix 40mg  BID, Carvedilol 12.5mg  BID and Benazepril 40mg  daily  2.  HTN  -renal dopplers negative in the past -BP controlled on exam today - he checks it daily and is very stable -continue Carvedilol, Hydralazine, Benazepril, Amlodipine, Clonidine .02mg  BID, Doxazosin 6mg  qhs. -he gets dizzy for about 30 min after taking  am meds so I instructed Joseph to take his amlodipine at lunch   3.  Mitral Regurgitation -? Severe on last TTE but TEE showed mild MVP with moderate MR and cardiac MRI with moderate MR -should repeat echo in   4.  TV fibroelastoma  -stable for years and by TEE 02/2019  5.  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  Nonsustained VT -evaluated by EP - no ICD given EF>35%   7.  HLD - LDL 67 in Sept 2020 -continue Crestor 10mg  daily  COVID-19 Education: The signs and symptoms of COVID-19 were discussed with the patient and how to seek care for testing (follow up with PCP or arrange E-visit).  The importance of social distancing was discussed today.  Patient Risk:   After full review of this patient's clinical status, I feel that they are at least moderate risk at this time.  Time:   Today, I have spent 20 minutes directly with the patient on telemedicine discussing medical problems including CHF, MR, HTN, CAD.  We also reviewed the symptoms of COVID 19 and the ways to protect against contracting the virus with telehealth technology.  I spent an additional 5 minutes reviewing patient's chart including labs, hospital notes.  Medication Adjustments/Labs and Tests Ordered: Current medicines are reviewed at length with the patient today.  Concerns regarding medicines are outlined above.  Tests Ordered: No orders of the defined types were placed in this encounter.  Medication Changes: No orders of the defined types were placed in this encounter.   Disposition:  Follow up prn as he is being followed in CHF clinic now. He is seeing Dr. [name] on 09/27/2019 Signed, Daniel Him, MD  09/24/2019 9:05 AM    [place]

## 2019-09-27 ENCOUNTER — Encounter (HOSPITAL_COMMUNITY): Payer: Self-pay

## 2019-09-27 ENCOUNTER — Other Ambulatory Visit: Payer: Self-pay

## 2019-09-27 ENCOUNTER — Ambulatory Visit (HOSPITAL_COMMUNITY)
Admission: RE | Admit: 2019-09-27 | Discharge: 2019-09-27 | Disposition: A | Discharge status: Home / Self Care | Payer: Medicaid Other | Source: Ambulatory Visit | Attending: Adult Health | Admitting: Adult Health

## 2019-09-27 ENCOUNTER — Telehealth (HOSPITAL_COMMUNITY): Payer: Self-pay

## 2019-09-27 VITALS — BP 90/59 | HR 72 | Wt 227.4 lb

## 2019-09-27 DIAGNOSIS — I340 Nonrheumatic mitral (valve) insufficiency: Secondary | ICD-10-CM | POA: Insufficient documentation

## 2019-09-27 DIAGNOSIS — Z7982 Long term (current) use of aspirin: Secondary | ICD-10-CM | POA: Insufficient documentation

## 2019-09-27 DIAGNOSIS — I110 Hypertensive heart disease with heart failure: Secondary | ICD-10-CM | POA: Insufficient documentation

## 2019-09-27 DIAGNOSIS — M109 Gout, unspecified: Secondary | ICD-10-CM | POA: Insufficient documentation

## 2019-09-27 DIAGNOSIS — I5042 Chronic combined systolic (congestive) and diastolic (congestive) heart failure: Secondary | ICD-10-CM | POA: Insufficient documentation

## 2019-09-27 DIAGNOSIS — I341 Nonrheumatic mitral (valve) prolapse: Secondary | ICD-10-CM | POA: Insufficient documentation

## 2019-09-27 DIAGNOSIS — I428 Other cardiomyopathies: Secondary | ICD-10-CM | POA: Diagnosis not present

## 2019-09-27 DIAGNOSIS — Z8249 Family history of ischemic heart disease and other diseases of the circulatory system: Secondary | ICD-10-CM | POA: Diagnosis not present

## 2019-09-27 DIAGNOSIS — F1010 Alcohol abuse, uncomplicated: Secondary | ICD-10-CM

## 2019-09-27 DIAGNOSIS — I2510 Atherosclerotic heart disease of native coronary artery without angina pectoris: Secondary | ICD-10-CM | POA: Diagnosis not present

## 2019-09-27 DIAGNOSIS — Z79899 Other long term (current) drug therapy: Secondary | ICD-10-CM | POA: Diagnosis not present

## 2019-09-27 DIAGNOSIS — N1831 Chronic kidney disease, stage 3a: Secondary | ICD-10-CM

## 2019-09-27 DIAGNOSIS — I9589 Other hypotension: Secondary | ICD-10-CM

## 2019-09-27 LAB — CBC
HCT: 40.5 % (ref 39.0–52.0)
Hemoglobin: 13.4 g/dL (ref 13.0–17.0)
MCH: 31.1 pg (ref 26.0–34.0)
MCHC: 33.1 g/dL (ref 30.0–36.0)
MCV: 94 fL (ref 80.0–100.0)
Platelets: 84 10*3/uL — ABNORMAL LOW (ref 150–400)
RBC: 4.31 MIL/uL (ref 4.22–5.81)
RDW: 12.6 % (ref 11.5–15.5)
WBC: 3.3 10*3/uL — ABNORMAL LOW (ref 4.0–10.5)
nRBC: 0 % (ref 0.0–0.2)

## 2019-09-27 LAB — BASIC METABOLIC PANEL
Anion gap: 9 (ref 5–15)
BUN: 19 mg/dL (ref 6–20)
CO2: 23 mmol/L (ref 22–32)
Calcium: 9.3 mg/dL (ref 8.9–10.3)
Chloride: 106 mmol/L (ref 98–111)
Creatinine, Ser: 1.66 mg/dL — ABNORMAL HIGH (ref 0.61–1.24)
GFR calc Af Amer: 53 mL/min — ABNORMAL LOW (ref >60)
GFR calc non Af Amer: 45 mL/min — ABNORMAL LOW (ref >60)
Glucose, Bld: 89 mg/dL (ref 70–99)
Potassium: 3.7 mmol/L (ref 3.5–5.1)
Sodium: 138 mmol/L (ref 135–145)

## 2019-09-27 LAB — BRAIN NATRIURETIC PEPTIDE: B Natriuretic Peptide: 388 pg/mL — ABNORMAL HIGH (ref 0.0–100.0)

## 2019-09-27 MED ORDER — FUROSEMIDE 40 MG PO TABS: 40.0000 mg | ORAL_TABLET | Freq: Every day | ORAL | 3 refills | Status: DC

## 2019-09-27 NOTE — Patient Instructions (Addendum)
Lab work done today. We will notify you of any abnormal lab work. No news is good news!  EKG done today.  HOLD Furosemide until Thursday. ON Thursday start Furosemide 40mg  daily.  HOLD Clonidine tonight only. RESTART TOMORROW.  Please follow up with the [facility] in 7 days.   At the [facility], you and your health needs are our priority. As part of our continuing mission to provide you with exceptional heart care, we have created designated Provider Care Teams. These Care Teams include your primary Cardiologist (physician) and Advanced Practice Providers (APPs- Physician Assistants and Nurse Practitioners) who all work together to provide you with the care you need, when you need it.   You may see any of the following providers on your designated Care Team at your next follow up: Marland Kitchen Dr Glori Bickers . Dr Loralie Champagne . Darrick Grinder, NP . Lyda Jester, PA . Audry Riles, PharmD   Please be sure to bring in all your medications bottles to every appointment.

## 2019-09-27 NOTE — Telephone Encounter (Signed)
Orthostatics:  91/63 = BP, 72 = P, 97%= O2 - Sitting  88/60 = BP, 78 = P, 97% = O2 - Standing

## 2019-09-27 NOTE — Progress Notes (Signed)
PCP: [name], MD Cardiology: Dr. [name] HF Cardiology: Dr. [name]  59 y.o. 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 [name] for evaluation of CHF. 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  Cardiolite in 5/16 showed no ischemia but possible apical infarction.  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  PYP scan was read as equivocal in 5/20 but probably not suggestive of transthyretin amyloidosis.   TEE was done in 6/20 to assess severity of MR.  EF was 50% with mild mitral valve prolapse and moderate MR.  RHC/LHC showed nonobstructive CAD, pulmonary venous hypertension, relatively controlled filling pressures. 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   Admitted 12/13 through 09/01/19. Prior to admit he was drinking 12 beers a day. Treated for A/C diastolic heart failure. Diuresed with IV lasix and transitioned to lasix 40 mg twice a day. Discharge weight 221 pounds.   Today he returns for HF follow up.Overall feeling fair. SBP at home 90-120. Has had recent dizziness when standing.  Denies SOB/ PND/Orthopnea. He has not been drinking alcohol since discharge. Appetite ok. No fever or chills. Weight at home 217-219 pounds. Taking all medications.   Labs (5/20): K 3.5, creatinine 1.23 Labs (6/20): Urine immunofixation negative, K 4.2, creatinine 1.36 Labs (7/20): creatinine 1.43 Labs (9/20): uric acid 7.5, LDL 67 Labs (09/27/19): K 3.7 Creatinine 1.66 BNP 388   PMH: 1. Gout 2. H/o NSVT/PVCs 3. 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  Seen on multiple echoes, first in 2015.  4. HTN: Renal artery dopplers in 6/16 were unremarkable. Sleep study about 2 years ago was negative per patient's report.  5.  Mitral regurgitation: TEE in 2015 with mild to moderate MR.  - Echo (2/20): Moderate MR.  - Echo (5/20): Possible severe MR - TEE (6/20): Mild mitral valve prolapse with moderate MR.  6. 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  - Cardiolite (5/16): Possible apical infarct, no ischemia.  - Echo (7/19): EF 50-55%.  - Echo (2/20): EF 50-55%, moderate MR, TV fibroelastoma.  - PYP scan (5/20): Grade 1 visually, H/CL 1.23.  Read as equivocal but likely negative for TTR amyloidosis.  - Echo (5/20): EF 40-45%, mildly dilated LV with mild LVH, normal RV size and systolic function, possible severe MR, moderate TR, 1 cm nodule on TV may be fibroelastoma, PASP 79 mmHg.  - TEE (6/20): EF 50%, mild LV dilation, mild LVH, moderate LAE, tricuspid valve mass likely fibroelastoma, mild MVP with moderate MR.  - LHC/RHC (6/20): 50% LAD, 30% pRCA; mean RA 4, PA 55/21 mean 34, mean PCWP 17, CI 2.66, PVR 2.8 WU.  [facility]  - Cardiac MRI (8/20): EF 53%, mild LVH, inferoseptal/inferior mid-wall LGE (?cardiac sarcoidosis, does not look like amyloidosis), mild RV dilation with RVEF 54%, moderate eccentric MR, severe LAE.   FH:  Father with MI at 62, brother with MI at 11, mother with CHF, MI.  HTN in multiple family members.   Social History   Socioeconomic History  . Marital status: Divorced    Spouse name: Not on file  . Number of children: Not on file  . Years of education: Not on file  . Highest education level: Not on file  Occupational History  .  Not on file  Tobacco Use  . Smoking status: Never Smoker  . Smokeless tobacco: Never Used  Substance and Sexual Activity  . Alcohol use: No  . Drug use: No  . Sexual activity: Not on file  Other Topics Concern  . Not on file  Social History Narrative  . Not on file   Social Determinants of Health   Financial Resource Strain:   . Difficulty of Paying Living Expenses: Not on file   Food Insecurity:   . Worried About Charity fundraiser in the Last Year: Not on file  . Ran Out of Food in the Last Year: Not on file  Transportation Needs:   . Lack of Transportation (Medical): Not on file  . Lack of Transportation (Non-Medical): Not on file  Physical Activity:   . Days of Exercise per Week: Not on file  . Minutes of Exercise per Session: Not on file  Stress:   . Feeling of Stress : Not on file  Social Connections:   . Frequency of Communication with Friends and Family: Not on file  . Frequency of Social Gatherings with Friends and Family: Not on file  . Attends Religious Services: Not on file  . Active Member of Clubs or Organizations: Not on file  . Attends Archivist Meetings: Not on file  . Marital Status: Not on file  Intimate Partner Violence:   . Fear of Current or Ex-Partner: Not on file  . Emotionally Abused: Not on file  . Physically Abused: Not on file  . Sexually Abused: Not on file   ROS: All systems reviewed and negative except as per HPI.   Current Outpatient Medications  Medication Sig Dispense Refill  . acetaminophen (TYLENOL) 650 MG CR tablet Take 1,300 mg by mouth every 8 (eight) hours as needed for pain.    Marland Kitchen allopurinol (ZYLOPRIM) 100 MG tablet Take 1 tablet (100 mg total) by mouth 2 (two) times daily. 60 tablet 5  . amLODipine (NORVASC) 10 MG tablet Take 1 tablet (10 mg total) by mouth daily with lunch. 30 tablet 4  . aspirin EC 81 MG tablet Take 1 tablet (81 mg total) by mouth daily. 90 tablet 3  . benazepril (LOTENSIN) 40 MG tablet Take 1 tablet (40 mg total) by mouth daily. 30 tablet 6  . bisacodyl (DULCOLAX) 5 MG EC tablet Take 1 tablet (5 mg total) by mouth daily as needed for moderate constipation. 4 tablet 0  . carvedilol (COREG) 12.5 MG tablet Take 1 tablet (12.5 mg total) by mouth 2 (two) times daily. 60 tablet 3  . cetirizine (ZYRTEC) 10 MG tablet TAKE 1 TABLET (10 MG TOTAL) BY MOUTH DAILY. 30 tablet 11  . cloNIDine  (CATAPRES) 0.2 MG tablet Take 1 tablet (0.2 mg total) by mouth 2 (two) times daily. 180 tablet 3  . diclofenac Sodium (VOLTAREN) 1 % GEL Apply 4 g topically 4 (four) times daily as needed (sciatic nerve).    Marland Kitchen docusate sodium (COLACE) 100 MG capsule Take 1 capsule (100 mg total) by mouth daily. 10 capsule 0  . doxazosin (CARDURA) 4 MG tablet TAKE 1.5 TABLETS (6 MG TOTAL) BY MOUTH AT BEDTIME. 45 tablet 2  . Ensure Max Protein (ENSURE MAX PROTEIN) LIQD Take 330 mLs (11 oz total) by mouth daily. 330 mL 3  . furosemide (LASIX) 40 MG tablet Take 40 mg by mouth 2 (two) times daily.    Marland Kitchen gabapentin (NEURONTIN) 300 MG capsule Take 1 capsule (300 mg  total) by mouth 3 (three) times daily. 90 capsule 3  . hydrALAZINE (APRESOLINE) 50 MG tablet Take 100 mg by mouth 3 (three) times daily.    . isosorbide mononitrate (IMDUR) 60 MG 24 hr tablet Take 1 tablet (60 mg total) by mouth daily. 90 tablet 3  . Multiple Vitamin (MULTIVITAMIN WITH MINERALS) TABS tablet Take 1 tablet by mouth daily. 30 tablet 0  . ondansetron (ZOFRAN ODT) 8 MG disintegrating tablet Take 1 tablet (8 mg total) by mouth every 8 (eight) hours as needed for nausea or vomiting. 20 tablet 0  . polyethylene glycol powder (GLYCOLAX/MIRALAX) 17 GM/SCOOP powder Take 255 g by mouth as directed. 119 g 0  . potassium chloride SA (K-DUR) 20 MEQ tablet Take 1 tablet (20 mEq total) by mouth daily. 90 tablet 3  . rosuvastatin (CRESTOR) 10 MG tablet Take 1 tablet (10 mg total) by mouth daily. 90 tablet 3  . spironolactone (ALDACTONE) 50 MG tablet Take 1 tablet (50 mg total) by mouth daily. 30 tablet 11  . thiamine 100 MG tablet Take 1 tablet (100 mg total) by mouth daily. 30 tablet 0  . tizanidine (ZANAFLEX) 2 MG capsule Take 2 mg by mouth 3 (three) times daily.    . traMADol (ULTRAM) 50 MG tablet Take 50 mg by mouth every 8 (eight) hours as needed for moderate pain.      No current facility-administered medications for this encounter.   BP (!) 90/59    Pulse 72   Wt 103.1 kg (227 lb 6.4 oz)   SpO2 100%   BMI 30.00 kg/m   Wt Readings from Last 3 Encounters:  09/27/19 103.1 kg  09/24/19 98.4 kg  09/01/19 100.4 kg   General:  Walked in the clinic. Marland Kitchen No resp difficulty HEENT: normal Neck: supple. no JVD. Carotids 2+ bilat; no bruits. No lymphadenopathy or thryomegaly appreciated. Cor: PMI nondisplaced. Regular rate & rhythm. No rubs, gallops or murmurs. Lungs: clear Abdomen: soft, nontender, nondistended. No hepatosplenomegaly. No bruits or masses. Good bowel sounds. Extremities: no cyanosis, clubbing, rash, edema Neuro: alert & orientedx3, cranial nerves grossly intact. moves all 4 extremities w/o difficulty. Affect pleasant  EKG: NSR 66 bpm   Assessment/Plan: 1. 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  Echo in 5/20 showed EF 40-45% with normal RV, possible severe mitral regurgitation, and severe pulmonary hypertension.  PYP scan in 5/20 was read as equivocal, but I suspect this is not suggestive of transthyretin amyloidosis. TEE in 5/20 showed EF 50%, mild LV dilation, moderate MR.  RHC/LHC showed mildly elevated PCWP, pulmonary venous hypertension, and nonobstructive CAD.  Cardiac MRI in 8/20 showed EF 53% and had an LGE pattern that was suggestive of possible cardiac sarcoidosis (not amyloidosis).   NYHA II. Volume status low and hypotensive. Appears dry. Hold lasix for the next few days then start lasix 40 mg daily on 09/30/19. - Continue benazepril 40 mg daily and hydralazine/Imdur.  - Continue Coreg 25 mg bid and spironolactone to 50 mg daily.  - Given concern for possible cardiac sarcoidosis, I will arrange for high resolution CT to assess for pulmonary sarcoidosis and will also check ACE level.  If this is unremarkable, will see if we can get a cardiac PET done at [facility] to assess for cardiac sarcoidosis.  2. Mitral regurgitation: ?severe on last TTE but TEE in 6/20 showed  mild mitral valve prolapse with only moderate MR.  Cardiac MRI also suggested moderate MR.  3. Suspected tricuspid valve  fibroelastoma: Has been noted for several years, seen on 6/20 TEE.  4. HTN: Poor control.  Negative renal artery dopplers and sleep study in past.  Today he is hypotensive. I am concerned he has not been taking his medications as ordered.  I asked him to hold clonidine tonight.  - I have asked him to write down BP reading and bring them back to his follow up .   5. CAD:  - No chest pain.  - Nonobstructive on cath in 6/20.  - continue Crestor, good lipids in 9/20.   6. ETOH  Heavy drinker in the past. He has not had alcoho since d/c in December.   Follow up next week. Refer to HF Paramedicine. I cant explain hypotension but suspect he is dry. Hopefully HF Paramedicine can help sort out his medications.   Daniel Vivero NP-C  09/27/2019

## 2019-09-29 ENCOUNTER — Telehealth (HOSPITAL_COMMUNITY): Payer: Self-pay

## 2019-09-29 NOTE — Telephone Encounter (Signed)
Pt called and left voicemail on triage line. Pt c/o continued dizziness and low blood pressures. Called pt back to further assess symptoms. No answer. Left voicemail.

## 2019-10-07 ENCOUNTER — Ambulatory Visit (HOSPITAL_COMMUNITY)
Admission: RE | Admit: 2019-10-07 | Discharge: 2019-10-07 | Disposition: A | Discharge status: Home / Self Care | Payer: Medicaid Other | Source: Ambulatory Visit | Attending: Cardiology | Admitting: Cardiology

## 2019-10-07 ENCOUNTER — Encounter (HOSPITAL_COMMUNITY): Payer: Self-pay

## 2019-10-07 ENCOUNTER — Other Ambulatory Visit: Payer: Self-pay

## 2019-10-07 VITALS — BP 124/88 | HR 61 | Wt 228.0 lb

## 2019-10-07 DIAGNOSIS — I472 Ventricular tachycardia: Secondary | ICD-10-CM | POA: Diagnosis not present

## 2019-10-07 DIAGNOSIS — I493 Ventricular premature depolarization: Secondary | ICD-10-CM | POA: Diagnosis not present

## 2019-10-07 DIAGNOSIS — Z7982 Long term (current) use of aspirin: Secondary | ICD-10-CM | POA: Insufficient documentation

## 2019-10-07 DIAGNOSIS — I2510 Atherosclerotic heart disease of native coronary artery without angina pectoris: Secondary | ICD-10-CM | POA: Insufficient documentation

## 2019-10-07 DIAGNOSIS — I5032 Chronic diastolic (congestive) heart failure: Secondary | ICD-10-CM | POA: Insufficient documentation

## 2019-10-07 DIAGNOSIS — I341 Nonrheumatic mitral (valve) prolapse: Secondary | ICD-10-CM | POA: Diagnosis not present

## 2019-10-07 DIAGNOSIS — I5042 Chronic combined systolic (congestive) and diastolic (congestive) heart failure: Secondary | ICD-10-CM | POA: Diagnosis not present

## 2019-10-07 DIAGNOSIS — Z79899 Other long term (current) drug therapy: Secondary | ICD-10-CM | POA: Diagnosis not present

## 2019-10-07 DIAGNOSIS — I340 Nonrheumatic mitral (valve) insufficiency: Secondary | ICD-10-CM | POA: Diagnosis not present

## 2019-10-07 DIAGNOSIS — I428 Other cardiomyopathies: Secondary | ICD-10-CM | POA: Insufficient documentation

## 2019-10-07 DIAGNOSIS — Z8249 Family history of ischemic heart disease and other diseases of the circulatory system: Secondary | ICD-10-CM | POA: Insufficient documentation

## 2019-10-07 DIAGNOSIS — I110 Hypertensive heart disease with heart failure: Secondary | ICD-10-CM | POA: Insufficient documentation

## 2019-10-07 DIAGNOSIS — M109 Gout, unspecified: Secondary | ICD-10-CM | POA: Diagnosis not present

## 2019-10-07 LAB — BASIC METABOLIC PANEL
Anion gap: 10 (ref 5–15)
BUN: 13 mg/dL (ref 6–20)
CO2: 25 mmol/L (ref 22–32)
Calcium: 9.1 mg/dL (ref 8.9–10.3)
Chloride: 103 mmol/L (ref 98–111)
Creatinine, Ser: 1.33 mg/dL — ABNORMAL HIGH (ref 0.61–1.24)
GFR calc Af Amer: >60 mL/min (ref >60)
GFR calc non Af Amer: 59 mL/min — ABNORMAL LOW (ref >60)
Glucose, Bld: 94 mg/dL (ref 70–99)
Potassium: 3.9 mmol/L (ref 3.5–5.1)
Sodium: 138 mmol/L (ref 135–145)

## 2019-10-07 NOTE — Patient Instructions (Signed)
Lab work done today. We will notify you of any abnormal lab work. No news is good news!  Someone will give you a call in order to schedule your CT scan. Non-Cardiac CT scanning, (CAT scanning), is a noninvasive, special x-ray that produces cross-sectional images of the body using x-rays and a computer. CT scans help physicians diagnose and treat medical conditions. For some CT exams, a contrast material is used to enhance visibility in the area of the body being studied. CT scans provide greater clarity and reveal more details than regular x-ray exams.  Please follow up with the [facility] in 2 months.  At the [facility], you and your health needs are our priority. As part of our continuing mission to provide you with exceptional heart care, we have created designated Provider Care Teams. These Care Teams include your primary Cardiologist (physician) and Advanced Practice Providers (APPs- Physician Assistants and Nurse Practitioners) who all work together to provide you with the care you need, when you need it.   You may see any of the following providers on your designated Care Team at your next follow up: Marland Kitchen Dr Glori Bickers . Dr Loralie Champagne . Darrick Grinder, NP . Lyda Jester, PA . Audry Riles, PharmD   Please be sure to bring in all your medications bottles to every appointment.

## 2019-10-07 NOTE — Telephone Encounter (Signed)
Pt to be seen in clinic today.

## 2019-10-07 NOTE — Addendum Note (Signed)
Encounter addended by: Kerry Dory, CMA on: 10/07/2019 12:23 PM  Actions taken: Clinical Note Signed

## 2019-10-07 NOTE — Progress Notes (Signed)
ReDS Vest / Clip - 10/07/19 1100      ReDS Vest / Clip   Station Marker  D    Ruler Value  35    ReDS Value Range  Low volume    ReDS Actual Value  36    Anatomical Comments  sitting

## 2019-10-07 NOTE — Progress Notes (Signed)
PCP: [name], MD Cardiology: Dr. [name] HF Cardiology: Dr. [name]  59 y.o. 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 [name] for evaluation of CHF. 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  Cardiolite in 5/16 showed no ischemia but possible apical infarction.  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  PYP scan was read as equivocal in 5/20 but probably not suggestive of transthyretin amyloidosis.   TEE was done in 6/20 to assess severity of MR.  EF was 50% with mild mitral valve prolapse and moderate MR.  RHC/LHC showed nonobstructive CAD, pulmonary venous hypertension, relatively controlled filling pressures. 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   Admitted 12/13 through 09/01/19. Prior to admit he was drinking 12 beers a day. Treated for A/C diastolic heart failure. Diuresed with IV lasix and transitioned to lasix 40 mg twice a day. Discharge weight 221 pounds.   Seen last week in clinic for f/u. Overall was feeling fair. SBP at home 90-120 but office BP was 90/56. Had had recent dizziness when standing but denied SOB/ PND/Orthopnea. He denied ETOH use since hospital discharge.  Appetite ok. No fever or chills. Weight at home 217-219 pounds. Reported taking all medications. His volume status was felt to be low and he was hypotensive. Appeared to be dehydrated. BMP showed slight AKI w/ bump in SCr to 1.6 (previous 1.3). CBC showed no anemia. He was instructed to hold lasix for several days then restart lasix 40 mg daily on 09/30/19.  He presents back to clinic for f/u. BP improved today at 124/88. BP has improved at home. He had 1 day of feeling dizzy w/ SBP in the 80s last week but this resolved after  drinking a glass of Gatorade. No further hypotension nor dizziness since last week.  Wt 228 lb (227 lb last OV). ReDs clip measurement 36%. No chest pain. Denies any further ETOH use.    Labs (5/20): K 3.5, creatinine 1.23 Labs (6/20): Urine immunofixation negative, K 4.2, creatinine 1.36 Labs (7/20): creatinine 1.43 Labs (9/20): uric acid 7.5, LDL 67 Labs (09/27/19): K 3.7 Creatinine 1.66 BNP 388   PMH: 1. Gout 2. H/o NSVT/PVCs 3. 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  Seen on multiple echoes, first in 2015.  4. HTN: Renal artery dopplers in 6/16 were unremarkable. Sleep study about 2 years ago was negative per patient's report.  5. Mitral regurgitation: TEE in 2015 with mild to moderate MR.  - Echo (2/20): Moderate MR.  - Echo (5/20): Possible severe MR - TEE (6/20): Mild mitral valve prolapse with moderate MR.  6. 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  - Cardiolite (5/16): Possible apical infarct, no ischemia.  - Echo (7/19): EF 50-55%.  - Echo (2/20): EF 50-55%, moderate MR, TV fibroelastoma.  - PYP scan (5/20): Grade 1 visually, H/CL 1.23.  Read as equivocal but likely negative for TTR amyloidosis.  - Echo (5/20): EF 40-45%, mildly dilated LV with mild LVH, normal RV size and systolic function, possible severe MR, moderate TR, 1 cm nodule on TV may be fibroelastoma, PASP 79 mmHg.  - TEE (6/20): EF 50%, mild LV dilation, mild LVH, moderate LAE, tricuspid valve mass likely fibroelastoma, mild MVP with moderate MR.  -  LHC/RHC (6/20): 50% LAD, 30% pRCA; mean RA 4, PA 55/21 mean 34, mean PCWP 17, CI 2.66, PVR 2.8 WU.  [facility]  - Cardiac MRI (8/20): EF 53%, mild LVH, inferoseptal/inferior mid-wall LGE (?cardiac sarcoidosis, does not look like amyloidosis), mild RV dilation with RVEF 54%, moderate eccentric MR, severe LAE.   FH:  Father with MI at 75, brother with MI at 45, mother with CHF, MI.  HTN in  multiple family members.   Social History   Socioeconomic History   Marital status: Divorced    Spouse name: Not on file   Number of children: Not on file   Years of education: Not on file   Highest education level: Not on file  Occupational History   Not on file  Tobacco Use   Smoking status: Never Smoker   Smokeless tobacco: Never Used  Substance and Sexual Activity   Alcohol use: No   Drug use: No   Sexual activity: Not on file  Other Topics Concern   Not on file  Social History Narrative   Not on file   Social Determinants of Health   Financial Resource Strain:    Difficulty of Paying Living Expenses: Not on file  Food Insecurity:    Worried About [place] in the Last Year: Not on file   Ran Out of Food in the Last Year: Not on file  Transportation Needs:    Lack of Transportation (Medical): Not on file   Lack of Transportation (Non-Medical): Not on file  Physical Activity:    Days of Exercise per Week: Not on file   Minutes of Exercise per Session: Not on file  Stress:    Feeling of Stress : Not on file  Social Connections:    Frequency of Communication with Friends and Family: Not on file   Frequency of Social Gatherings with Friends and Family: Not on file   Attends Religious Services: Not on file   Active Member of Clubs or Organizations: Not on file   Attends Archivist Meetings: Not on file   Marital Status: Not on file  Intimate Partner Violence:    Fear of Current or Ex-Partner: Not on file   Emotionally Abused: Not on file   Physically Abused: Not on file   Sexually Abused: Not on file   ROS: All systems reviewed and negative except as per HPI.   Current Outpatient Medications  Medication Sig Dispense Refill   acetaminophen (TYLENOL) 650 MG CR tablet Take 1,300 mg by mouth every 8 (eight) hours as needed for pain.     allopurinol (ZYLOPRIM) 100 MG tablet Take 1 tablet (100 mg total) by mouth 2  (two) times daily. 60 tablet 5   amLODipine (NORVASC) 10 MG tablet Take 1 tablet (10 mg total) by mouth daily with lunch. 30 tablet 4   aspirin EC 81 MG tablet Take 1 tablet (81 mg total) by mouth daily. 90 tablet 3   benazepril (LOTENSIN) 40 MG tablet Take 1 tablet (40 mg total) by mouth daily. 30 tablet 6   bisacodyl (DULCOLAX) 5 MG EC tablet Take 1 tablet (5 mg total) by mouth daily as needed for moderate constipation. 4 tablet 0   carvedilol (COREG) 12.5 MG tablet Take 1 tablet (12.5 mg total) by mouth 2 (two) times daily. 60 tablet 3   cetirizine (ZYRTEC) 10 MG tablet TAKE 1 TABLET (10 MG TOTAL) BY MOUTH DAILY. 30 tablet 11   cloNIDine (CATAPRES) 0.2 MG tablet Take  1 tablet (0.2 mg total) by mouth 2 (two) times daily. 180 tablet 3   diclofenac Sodium (VOLTAREN) 1 % GEL Apply 4 g topically 4 (four) times daily as needed (sciatic nerve).     docusate sodium (COLACE) 100 MG capsule Take 1 capsule (100 mg total) by mouth daily. 10 capsule 0   doxazosin (CARDURA) 4 MG tablet TAKE 1.5 TABLETS (6 MG TOTAL) BY MOUTH AT BEDTIME. 45 tablet 2   Ensure Max Protein (ENSURE MAX PROTEIN) LIQD Take 330 mLs (11 oz total) by mouth daily. 330 mL 3   furosemide (LASIX) 40 MG tablet Take 1 tablet (40 mg total) by mouth daily. 90 tablet 3   gabapentin (NEURONTIN) 300 MG capsule Take 1 capsule (300 mg total) by mouth 3 (three) times daily. 90 capsule 3   hydrALAZINE (APRESOLINE) 50 MG tablet Take 100 mg by mouth 3 (three) times daily.     isosorbide mononitrate (IMDUR) 60 MG 24 hr tablet Take 1 tablet (60 mg total) by mouth daily. 90 tablet 3   Multiple Vitamin (MULTIVITAMIN WITH MINERALS) TABS tablet Take 1 tablet by mouth daily. 30 tablet 0   ondansetron (ZOFRAN ODT) 8 MG disintegrating tablet Take 1 tablet (8 mg total) by mouth every 8 (eight) hours as needed for nausea or vomiting. 20 tablet 0   polyethylene glycol powder (GLYCOLAX/MIRALAX) 17 GM/SCOOP powder Take 255 g by mouth as directed.  119 g 0   potassium chloride SA (K-DUR) 20 MEQ tablet Take 1 tablet (20 mEq total) by mouth daily. 90 tablet 3   rosuvastatin (CRESTOR) 10 MG tablet Take 1 tablet (10 mg total) by mouth daily. 90 tablet 3   spironolactone (ALDACTONE) 50 MG tablet Take 1 tablet (50 mg total) by mouth daily. 30 tablet 11   thiamine 100 MG tablet Take 1 tablet (100 mg total) by mouth daily. 30 tablet 0   tizanidine (ZANAFLEX) 2 MG capsule Take 2 mg by mouth 3 (three) times daily.     traMADol (ULTRAM) 50 MG tablet Take 50 mg by mouth every 8 (eight) hours as needed for moderate pain.      No current facility-administered medications for this visit.   There were no vitals taken for this visit.  Wt Readings from Last 3 Encounters:  09/27/19 103.1 kg (227 lb 6.4 oz)  09/24/19 98.4 kg (217 lb)  09/01/19 100.4 kg (221 lb 4.8 oz)   PHYSICAL EXAM: General:  Well appearing. No respiratory difficulty HEENT: normal Neck: supple. no JVD. Carotids 2+ bilat; no bruits. No lymphadenopathy or thyromegaly appreciated. Cor: PMI nondisplaced. Regular rate & rhythm. No rubs, gallops or murmurs. Lungs: clear Abdomen: soft, nontender, nondistended. No hepatosplenomegaly. No bruits or masses. Good bowel sounds. Extremities: no cyanosis, clubbing, rash, edema Neuro: alert & oriented x 3, cranial nerves grossly intact. moves all 4 extremities w/o difficulty. Affect pleasant.   EKG: not performed   Assessment/Plan: 1. 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  Echo in 5/20 showed EF 40-45% with normal RV, possible severe mitral regurgitation, and severe pulmonary hypertension.  PYP scan in 5/20 was read as equivocal, but suspect this is not suggestive of transthyretin amyloidosis. TEE in 5/20 showed EF 50%, mild LV dilation, moderate MR.  RHC/LHC showed mildly elevated PCWP, pulmonary venous hypertension, and nonobstructive CAD.  Cardiac MRI in 8/20 showed EF 53% and  had an LGE pattern that was suggestive of possible cardiac sarcoidosis (not amyloidosis).   NYHA II, stable  - required  recent diuretic hold due to dehydration/ AKI w/ hypotension. Back on lasix 40 mg daily and tolerating ok w/o recurrent hypotension or dizziness. BP 124/88 today. Volume status stable on exam. ReDs clip 36%.  - Repeat BMP today to see if AKI has resolved.  - Continue Lasix 40 mg daily.  - Continue benazepril 40 mg daily and hydralazine/Imdur.  - Continue Coreg 25 mg bid and spironolactone to 50 mg daily.  - Given concern for possible cardiac sarcoidosis, plan is to further evaluate w/ high resolution CT to assess for pulmonary sarcoidosis. Order placed but not yet completed. Pt encouraged to schedule study, as recommended by Dr. [name]. He agrees to proceed and will schedule. If this is unremarkable, will see if we can get a cardiac PET [facility] to assess for cardiac sarcoidosis. ACE level at previous visit was low at 10.  2. Mitral regurgitation: ?severe on last TTE but TEE in 6/20 showed mild mitral valve prolapse with only moderate MR.  Cardiac MRI also suggested moderate MR. Denies dyspnea or CP.  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  4. HTN: Negative renal artery dopplers and sleep study in past. Well controlled today. Continue current regimen.   5. CAD:  - No chest pain.  - Nonobstructive on cath in 6/20.  - continue Crestor, good lipids in 9/20.   6. ETOH  Heavy drinker in the past. He has not had alcohol since d/c in December.   F/u w/ Dr. [name] in 2 months.   Lyda Jester PA-C  10/07/2019

## 2019-10-14 ENCOUNTER — Ambulatory Visit (HOSPITAL_COMMUNITY): Payer: Medicaid Other

## 2019-11-03 ENCOUNTER — Ambulatory Visit (HOSPITAL_COMMUNITY): Payer: Medicaid Other | Attending: Cardiology

## 2019-11-04 ENCOUNTER — Other Ambulatory Visit (HOSPITAL_COMMUNITY): Payer: Medicaid Other

## 2019-11-16 ENCOUNTER — Other Ambulatory Visit: Payer: Self-pay

## 2019-11-16 ENCOUNTER — Ambulatory Visit (HOSPITAL_COMMUNITY): Payer: Medicaid Other | Attending: Internal Medicine

## 2019-11-16 DIAGNOSIS — I340 Nonrheumatic mitral (valve) insufficiency: Secondary | ICD-10-CM | POA: Diagnosis not present

## 2019-11-18 ENCOUNTER — Telehealth: Payer: Self-pay | Admitting: Cardiology

## 2019-11-18 NOTE — Telephone Encounter (Signed)
Advised patient that his results have not been reviewed yet but once they are I will give him a call back with the results.

## 2019-11-18 NOTE — Telephone Encounter (Signed)
New Message  Patient called to receive his ECHOcardiogram results.  Please call back

## 2019-11-29 ENCOUNTER — Ambulatory Visit: Payer: Medicaid Other | Attending: Internal Medicine

## 2019-11-29 DIAGNOSIS — Z23 Encounter for immunization: Secondary | ICD-10-CM

## 2019-11-29 NOTE — Progress Notes (Signed)
   Covid-19 Vaccination Clinic  Name:  Daniel Joseph    MRN: [id number] DOB: [birth year]  11/29/2019  Daniel Joseph was observed post Covid-19 immunization for 15 minutes without incident. He was provided with Vaccine Information Sheet and instruction to access the V-Safe system.   Daniel Joseph was instructed to call 911 with any severe reactions post vaccine: Marland Kitchen Difficulty breathing  . Swelling of face and throat  . A fast heartbeat  . A bad rash all over body  . Dizziness and weakness   Immunizations Administered    Name Date Dose VIS Date Route   Pfizer COVID-19 Vaccine 11/29/2019  1:18 PM 0.3 mL 08/27/2019 Intramuscular   Manufacturer: [place]   Lot: [id number]   [place]: [id number]

## 2019-12-08 ENCOUNTER — Telehealth (HOSPITAL_COMMUNITY): Payer: Self-pay

## 2019-12-08 NOTE — Telephone Encounter (Signed)

## 2019-12-09 ENCOUNTER — Encounter (HOSPITAL_COMMUNITY): Payer: Medicaid Other | Admitting: Cardiology

## 2019-12-22 ENCOUNTER — Ambulatory Visit: Payer: Medicaid Other | Attending: Internal Medicine

## 2019-12-22 DIAGNOSIS — Z23 Encounter for immunization: Secondary | ICD-10-CM

## 2019-12-22 NOTE — Progress Notes (Signed)
   Covid-19 Vaccination Clinic  Name:  FRAZIER METZKER    MRN: [id number] DOB: 11/11/1960  12/22/2019  Mr. Menze was observed post Covid-19 immunization for 15 minutes without incident. He was provided with Vaccine Information Sheet and instruction to access the V-Safe system.   Mr. Gean was instructed to call 911 with any severe reactions post vaccine: Marland Kitchen Difficulty breathing  . Swelling of face and throat  . A fast heartbeat  . A bad rash all over body  . Dizziness and weakness   Immunizations Administered    Name Date Dose VIS Date Route   Pfizer COVID-19 Vaccine 12/22/2019  1:33 PM 0.3 mL 08/27/2019 Intramuscular   Manufacturer: [place]   Lot: [id number]   [employer]: [id number]

## 2019-12-28 ENCOUNTER — Other Ambulatory Visit: Payer: Self-pay | Admitting: Internal Medicine

## 2020-01-28 ENCOUNTER — Encounter (HOSPITAL_COMMUNITY): Payer: Self-pay | Admitting: Cardiology

## 2020-01-28 ENCOUNTER — Ambulatory Visit (HOSPITAL_COMMUNITY)
Admission: RE | Admit: 2020-01-28 | Discharge: 2020-01-28 | Disposition: A | Discharge status: Home / Self Care | Payer: Medicaid Other | Source: Ambulatory Visit | Attending: Cardiology | Admitting: Cardiology

## 2020-01-28 ENCOUNTER — Other Ambulatory Visit: Payer: Self-pay

## 2020-01-28 VITALS — BP 122/88 | HR 88 | Wt 225.0 lb

## 2020-01-28 DIAGNOSIS — I341 Nonrheumatic mitral (valve) prolapse: Secondary | ICD-10-CM | POA: Diagnosis not present

## 2020-01-28 DIAGNOSIS — R9431 Abnormal electrocardiogram [ECG] [EKG]: Secondary | ICD-10-CM | POA: Insufficient documentation

## 2020-01-28 DIAGNOSIS — M109 Gout, unspecified: Secondary | ICD-10-CM | POA: Insufficient documentation

## 2020-01-28 DIAGNOSIS — I2510 Atherosclerotic heart disease of native coronary artery without angina pectoris: Secondary | ICD-10-CM | POA: Diagnosis not present

## 2020-01-28 DIAGNOSIS — I509 Heart failure, unspecified: Secondary | ICD-10-CM | POA: Diagnosis present

## 2020-01-28 DIAGNOSIS — I110 Hypertensive heart disease with heart failure: Secondary | ICD-10-CM | POA: Insufficient documentation

## 2020-01-28 DIAGNOSIS — I428 Other cardiomyopathies: Secondary | ICD-10-CM | POA: Insufficient documentation

## 2020-01-28 DIAGNOSIS — I5032 Chronic diastolic (congestive) heart failure: Secondary | ICD-10-CM | POA: Insufficient documentation

## 2020-01-28 DIAGNOSIS — Z7982 Long term (current) use of aspirin: Secondary | ICD-10-CM | POA: Diagnosis not present

## 2020-01-28 DIAGNOSIS — R55 Syncope and collapse: Secondary | ICD-10-CM | POA: Diagnosis not present

## 2020-01-28 DIAGNOSIS — I340 Nonrheumatic mitral (valve) insufficiency: Secondary | ICD-10-CM | POA: Diagnosis not present

## 2020-01-28 DIAGNOSIS — Z8249 Family history of ischemic heart disease and other diseases of the circulatory system: Secondary | ICD-10-CM | POA: Diagnosis not present

## 2020-01-28 DIAGNOSIS — I5042 Chronic combined systolic (congestive) and diastolic (congestive) heart failure: Secondary | ICD-10-CM

## 2020-01-28 DIAGNOSIS — D869 Sarcoidosis, unspecified: Secondary | ICD-10-CM

## 2020-01-28 DIAGNOSIS — Z79899 Other long term (current) drug therapy: Secondary | ICD-10-CM | POA: Insufficient documentation

## 2020-01-28 LAB — LIPID PANEL
Cholesterol: 129 mg/dL (ref 0–200)
HDL: 59 mg/dL (ref >40)
LDL Cholesterol: 57 mg/dL (ref 0–99)
Total CHOL/HDL Ratio: 2.2 RATIO
Triglycerides: 66 mg/dL (ref <150)
VLDL: 13 mg/dL (ref 0–40)

## 2020-01-28 LAB — BASIC METABOLIC PANEL
Anion gap: 10 (ref 5–15)
BUN: 14 mg/dL (ref 6–20)
CO2: 25 mmol/L (ref 22–32)
Calcium: 9.1 mg/dL (ref 8.9–10.3)
Chloride: 106 mmol/L (ref 98–111)
Creatinine, Ser: 1.52 mg/dL — ABNORMAL HIGH (ref 0.61–1.24)
GFR calc Af Amer: 59 mL/min — ABNORMAL LOW (ref >60)
GFR calc non Af Amer: 50 mL/min — ABNORMAL LOW (ref >60)
Glucose, Bld: 104 mg/dL — ABNORMAL HIGH (ref 70–99)
Potassium: 4.1 mmol/L (ref 3.5–5.1)
Sodium: 141 mmol/L (ref 135–145)

## 2020-01-28 MED ORDER — BENAZEPRIL HCL 20 MG PO TABS: 20.0000 mg | ORAL_TABLET | Freq: Every day | ORAL | 6 refills | Status: DC

## 2020-01-28 MED ORDER — FUROSEMIDE 20 MG PO TABS: 20.0000 mg | ORAL_TABLET | Freq: Every day | ORAL | 4 refills | Status: DC

## 2020-01-28 MED ORDER — HYDRALAZINE HCL 50 MG PO TABS: 50.0000 mg | ORAL_TABLET | Freq: Three times a day (TID) | ORAL | 3 refills | Status: DC

## 2020-01-28 MED ORDER — CLONIDINE HCL 0.2 MG PO TABS: 0.1000 mg | ORAL_TABLET | Freq: Two times a day (BID) | ORAL | 0 refills | Status: DC

## 2020-01-28 MED ORDER — BENAZEPRIL HCL 5 MG PO TABS: 5.0000 mg | ORAL_TABLET | Freq: Every day | ORAL | 3 refills | Status: DC

## 2020-01-28 NOTE — Patient Instructions (Addendum)
DECREASE Furosemide (Lasix) to 20mg  daily  RESTART Benazepril at 5 mg, one tab daily (start tomorrow)  STOP Cardura  STOP Clonidine    STOP Hydralazine    Your physician recommends that you schedule a follow-up appointment in: 3 weeks with the Nurse Practitioner   Non-Cardiac CT scanning, (CAT scanning), is a noninvasive, special x-ray that produces cross-sectional images of the body using x-rays and a computer. CT scans help physicians diagnose and treat medical conditions. For some CT exams, a contrast material is used to enhance visibility in the area of the body being studied. CT scans provide greater clarity and reveal more details than regular x-ray exams.   At the [facility], you and your health needs are our priority. As part of our continuing mission to provide you with exceptional heart care, we have created designated Provider Care Teams. These Care Teams include your primary Cardiologist (physician) and Advanced Practice Providers (APPs- Physician Assistants and Nurse Practitioners) who all work together to provide you with the care you need, when you need it.   You may see any of the following providers on your designated Care Team at your next follow up: Marland Kitchen Dr Glori Bickers . Dr Loralie Champagne . Darrick Grinder, NP . Lyda Jester, PA . Audry Riles, PharmD   Please be sure to bring in all your medications bottles to every appointment.

## 2020-01-28 NOTE — Progress Notes (Addendum)
Pt in office for MD visit. During EKG pt endorsed to EKG tech that he was dizzy.  Upon assessment, pt was visibly diaphoretic and endorsed feeling dizzy. Laid patient down.  Vitals taken while laying, pt BP initially high however when sat back up, pt endorsed similar feeling of dizziness and appeared diaphoretic. Laid patient back down. D/w Dr [name], ordered for 500 NS bolus.  11am received bolus (infused over 1/2 hour) Post bolus patient reported feeling better. Sat up without any complaints.  Vitals taken several times while sitting and remained stable.  MD aware. Discussed AVS. 12pm Pt ambulated out of office on his own, steady gait.

## 2020-01-30 NOTE — Progress Notes (Signed)
PCP: [name], MD Cardiology: Dr. [name] HF Cardiology: Dr. [name]  59 y.o. 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 [name] for evaluation of CHF. 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  Cardiolite in 5/16 showed no ischemia but possible apical infarction.  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  PYP scan was read as equivocal in 5/20 but probably not suggestive of transthyretin amyloidosis.   TEE was done in 6/20 to assess severity of MR.  EF was 50% with mild mitral valve prolapse and moderate MR.  RHC/LHC showed nonobstructive CAD, pulmonary venous hypertension, relatively controlled filling pressures. 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   Echo in 3/21 showed EF 55-60%, normal RV, severe LAE, moderate-severe MR.    Patient's BP has been poorly controlled over time.  He has not been consistent with medication compliance.  Renal artery dopplers were unremarkable in 6/16.   Patient presents for followup of CHF.  He says that he has been out of benazepril, doxazosin, and hydralazine but is taking all his other medications.  He reports no significant exertional dyspnea or chest pain.  No orthopnea/PND.  He reports an episode of severe lightheadedness with standing earlier in the week.  Today, when he stood up after his appointment he was presyncopal and had to lie down (did not pass out).  SBP in 70s (was 104/82 when he first arrived).  He was given 500 cc NS in the office with resolution of orthostatic symptoms and improvement in BP.    Labs (5/20): K 3.5, creatinine 1.23 Labs (6/20): Urine immunofixation negative, K 4.2, creatinine 1.36 Labs (7/20): creatinine 1.43 Labs (9/20): uric acid 7.5, LDL  67 Labs (1/21): K 3.9, creatinine 1.33, BNP 388, ACE level low  ECG (personally reviewed): NSR, LVH  PMH: 1. Gout 2. H/o NSVT/PVCs 3. 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  Seen on multiple echoes, first in 2015.  4. HTN: Renal artery dopplers in 6/16 were unremarkable. Sleep study about 2 years ago was negative per patient's report.  5. Mitral regurgitation: TEE in 2015 with mild to moderate MR.  - Echo (2/20): Moderate MR.  - Echo (5/20): Possible severe MR - TEE (6/20): Mild mitral valve prolapse with moderate MR.  - Echo (3/21): Moderate to severe MR.  6. 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  - Cardiolite (5/16): Possible apical infarct, no ischemia.  - Echo (7/19): EF 50-55%.  - Echo (2/20): EF 50-55%, moderate MR, TV fibroelastoma.  - PYP scan (5/20): Grade 1 visually, H/CL 1.23.  Read as equivocal but likely negative for TTR amyloidosis.  - Echo (5/20): EF 40-45%, mildly dilated LV with mild LVH, normal RV size and systolic function, possible severe MR, moderate TR, 1 cm nodule on TV may be fibroelastoma, PASP 79 mmHg.  - TEE (6/20): EF 50%, mild LV dilation, mild LVH, moderate LAE, tricuspid valve mass likely fibroelastoma, mild MVP with moderate MR.  - LHC/RHC (6/20): 50% LAD, 30% pRCA; mean RA 4, PA 55/21 mean 34, mean PCWP 17, CI 2.66, PVR 2.8 WU.  - Cardiac MRI (8/20): EF 53%, mild LVH, inferoseptal/inferior mid-wall LGE (?cardiac sarcoidosis, does not look like amyloidosis), mild RV dilation with RVEF 54%, moderate eccentric  MR, severe LAE. - Echo (3/21):  EF 55-60%, normal RV, severe LAE, moderate-severe MR.  FH:  Father with MI at 49, brother with MI at 55, mother with CHF, MI.  HTN in multiple family members.   Social History   Socioeconomic History  . Marital status: Divorced    Spouse name: Not on file  . Number of children: Not on file  . Years of education: Not on file  . Highest  education level: Not on file  Occupational History  . Not on file  Tobacco Use  . Smoking status: Never Smoker  . Smokeless tobacco: Never Used  Substance and Sexual Activity  . Alcohol use: No  . Drug use: No  . Sexual activity: Not on file  Other Topics Concern  . Not on file  Social History Narrative  . Not on file   Social Determinants of Health   Financial Resource Strain:   . Difficulty of Paying Living Expenses:   Food Insecurity:   . Worried About Charity fundraiser in the Last Year:   . Arboriculturist in the Last Year:   Transportation Needs:   . Film/video editor (Medical):   Marland Kitchen Lack of Transportation (Non-Medical):   Physical Activity:   . Days of Exercise per Week:   . Minutes of Exercise per Session:   Stress:   . Feeling of Stress :   Social Connections:   . Frequency of Communication with Friends and Family:   . Frequency of Social Gatherings with Friends and Family:   . Attends Religious Services:   . Active Member of Clubs or Organizations:   . Attends Archivist Meetings:   Marland Kitchen Marital Status:   Intimate Partner Violence:   . Fear of Current or Ex-Partner:   . Emotionally Abused:   Marland Kitchen Physically Abused:   . Sexually Abused:    ROS: All systems reviewed and negative except as per HPI.   Current Outpatient Medications  Medication Sig Dispense Refill  . acetaminophen (TYLENOL) 650 MG CR tablet Take 1,300 mg by mouth every 8 (eight) hours as needed for pain.    Marland Kitchen allopurinol (ZYLOPRIM) 100 MG tablet Take 1 tablet (100 mg total) by mouth 2 (two) times daily. 60 tablet 5  . amLODipine (NORVASC) 10 MG tablet Take 1 tablet (10 mg total) by mouth daily with lunch. 30 tablet 4  . aspirin EC 81 MG tablet Take 1 tablet (81 mg total) by mouth daily. 90 tablet 3  . benazepril (LOTENSIN) 5 MG tablet Take 1 tablet (5 mg total) by mouth daily. 30 tablet 3  . bisacodyl (DULCOLAX) 5 MG EC tablet Take 1 tablet (5 mg total) by mouth daily as needed for  moderate constipation. 4 tablet 0  . carvedilol (COREG) 12.5 MG tablet Take 1 tablet (12.5 mg total) by mouth 2 (two) times daily. 60 tablet 3  . cetirizine (ZYRTEC) 10 MG tablet TAKE 1 TABLET (10 MG TOTAL) BY MOUTH DAILY. 30 tablet 11  . diclofenac Sodium (VOLTAREN) 1 % GEL Apply 4 g topically 4 (four) times daily as needed (sciatic nerve).    Marland Kitchen docusate sodium (COLACE) 100 MG capsule Take 1 capsule (100 mg total) by mouth daily. 10 capsule 0  . Ensure Max Protein (ENSURE MAX PROTEIN) LIQD Take 330 mLs (11 oz total) by mouth daily. 330 mL 3  . furosemide (LASIX) 20 MG tablet Take 1 tablet (20 mg total) by mouth daily. 30 tablet 4  .  gabapentin (NEURONTIN) 300 MG capsule Take 1 capsule (300 mg total) by mouth 3 (three) times daily. 90 capsule 3  . isosorbide mononitrate (IMDUR) 60 MG 24 hr tablet Take 1 tablet (60 mg total) by mouth daily. 90 tablet 3  . Multiple Vitamin (MULTIVITAMIN WITH MINERALS) TABS tablet Take 1 tablet by mouth daily. 30 tablet 0  . ondansetron (ZOFRAN ODT) 8 MG disintegrating tablet Take 1 tablet (8 mg total) by mouth every 8 (eight) hours as needed for nausea or vomiting. 20 tablet 0  . polyethylene glycol powder (GLYCOLAX/MIRALAX) 17 GM/SCOOP powder Take 255 g by mouth as directed. 119 g 0  . potassium chloride SA (K-DUR) 20 MEQ tablet Take 1 tablet (20 mEq total) by mouth daily. 90 tablet 3  . rosuvastatin (CRESTOR) 10 MG tablet Take 1 tablet (10 mg total) by mouth daily. 90 tablet 3  . spironolactone (ALDACTONE) 50 MG tablet Take 1 tablet (50 mg total) by mouth daily. 30 tablet 11  . thiamine 100 MG tablet Take 1 tablet (100 mg total) by mouth daily. 30 tablet 0  . tizanidine (ZANAFLEX) 2 MG capsule Take 2 mg by mouth 3 (three) times daily.    . traMADol (ULTRAM) 50 MG tablet Take 50 mg by mouth every 8 (eight) hours as needed for moderate pain.      No current facility-administered medications for this encounter.   BP 122/88 (Patient Position: Sitting)   Pulse 88    Wt 102.1 kg (225 lb)   SpO2 97%   BMI 29.69 kg/m  General: NAD Neck: No JVD, no thyromegaly or thyroid nodule.  Lungs: Clear to auscultation bilaterally with normal respiratory effort. CV: Nondisplaced PMI.  Heart regular S1/S2, no S3/S4, 2/6 HSM apex.  No peripheral edema.  No carotid bruit.  Normal pedal pulses.  Abdomen: Soft, nontender, no hepatosplenomegaly, no distention.  Skin: Intact without lesions or rashes.  Neurologic: Alert and oriented x 3.  Psych: Normal affect. Extremities: No clubbing or cyanosis.  HEENT: Normal.   Assessment/Plan: 1. Presyncope: Patient was orthostatic in the office and nearly passed out.  BP markedly low, improved with NS bolus.  He is on multiple antihypertensives as well as Lasix.  He is actually out of benzepril, doxazosin, and hydralazine.  I think that his compliance with his complicated medication regimen is quite poor, and suspect additional BP meds have been added over time in the setting of him not regularly taking his other BP meds.  - See plan below regarding BP meds.  2. HTN: Negative renal artery dopplers and sleep study in past.  On multiple agents. Historically poorly controlled, think medication noncompliance plays a large role.  Orthostatic today with low BP, improved with IV fluid.  - He will not restart hydralazine or doxazosin.  - I would like to keep him on a low dose of ACEI given prior low EF.  Will start back on benazepril 5 mg daily.  - He will stop clonidine.  - He will decrease Lasix to 20 mg daily.  - For now, continue Coreg, spironolactone, and amlodipine.  3. 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  Echo in 5/20 showed EF 40-45% with normal RV, possible severe mitral regurgitation, and severe pulmonary hypertension.  PYP scan in 5/20 was read as equivocal, but I suspect this is not suggestive of transthyretin amyloidosis. TEE in 5/20 showed EF 50%, mild LV dilation,  moderate MR.  RHC/LHC showed mildly elevated PCWP, pulmonary venous hypertension, and nonobstructive  CAD.  Cardiac MRI in 8/20 showed EF 53% and had an LGE pattern that was suggestive of possible cardiac sarcoidosis (not amyloidosis).  Echo in 3/21 showed EF up to 55-60%.  On exam today, he is not volume overloaded.  NYHA class II symptoms.  - As above, decrease Lasix to 20 mg daily.  BMET today.  - Restart lower dose of benazepril, 5 mg daily.  He will stay off hydralazine/Imdur for now.  - Continue Coreg 12.5 mg bid and spironolactone 50 mg daily.  - Given concern for possible cardiac sarcoidosis, I will arrange for high resolution CT to assess for pulmonary sarcoidosis (ACE level not elevated).  If this is unremarkable, will see if we can get a cardiac PET done at [facility] to assess for cardiac sarcoidosis.  4. Mitral regurgitation: ?severe on 5/20 TTE but TEE in 6/20 showed mild mitral valve prolapse with only moderate MR.  Cardiac MRI also suggested moderate MR. TTE in 3/21 suggested moderate-severe MR.  - Follow for now, may be eventual candidate for MV repair in future.    5. 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  6. 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  - continue Crestor, check lipids today.    Followup 2 wks with HF pharmacist to assess BP control.  See NP/PA in 6 wks. I am going to set him up for paramedicine to help with medication management.   Loralie Champagne 01/30/2020

## 2020-02-01 ENCOUNTER — Ambulatory Visit (INDEPENDENT_AMBULATORY_CARE_PROVIDER_SITE_OTHER): Payer: Medicaid Other | Admitting: Family Medicine

## 2020-02-01 ENCOUNTER — Encounter: Payer: Self-pay | Admitting: Family Medicine

## 2020-02-01 ENCOUNTER — Other Ambulatory Visit: Payer: Self-pay

## 2020-02-01 ENCOUNTER — Telehealth (HOSPITAL_COMMUNITY): Payer: Self-pay | Admitting: Licensed Clinical Social Worker

## 2020-02-01 VITALS — BP 142/96 | HR 82 | Temp 98.6°F | Ht 73.0 in | Wt 224.2 lb

## 2020-02-01 DIAGNOSIS — M109 Gout, unspecified: Secondary | ICD-10-CM

## 2020-02-01 DIAGNOSIS — R110 Nausea: Secondary | ICD-10-CM

## 2020-02-01 DIAGNOSIS — G629 Polyneuropathy, unspecified: Secondary | ICD-10-CM | POA: Diagnosis not present

## 2020-02-01 DIAGNOSIS — I160 Hypertensive urgency: Secondary | ICD-10-CM | POA: Diagnosis not present

## 2020-02-01 DIAGNOSIS — I10 Essential (primary) hypertension: Secondary | ICD-10-CM

## 2020-02-01 DIAGNOSIS — Z09 Encounter for follow-up examination after completed treatment for conditions other than malignant neoplasm: Secondary | ICD-10-CM | POA: Diagnosis not present

## 2020-02-01 LAB — POCT URINALYSIS DIPSTICK
Bilirubin, UA: NEGATIVE
Glucose, UA: NEGATIVE
Ketones, UA: NEGATIVE
Leukocytes, UA: NEGATIVE
Nitrite, UA: NEGATIVE
Protein, UA: POSITIVE — AB
Spec Grav, UA: 1.02 (ref 1.010–1.025)
Urobilinogen, UA: 0.2 E.U./dL
pH, UA: 6 (ref 5.0–8.0)

## 2020-02-01 MED ORDER — CLONIDINE HCL 0.1 MG PO TABS
0.2000 mg | ORAL_TABLET | Freq: Once | ORAL | Status: AC
  Administered 2020-02-01: 0.2 mg via ORAL

## 2020-02-01 MED ORDER — ALLOPURINOL 100 MG PO TABS: 100.0000 mg | ORAL_TABLET | Freq: Two times a day (BID) | ORAL | 6 refills | Status: DC

## 2020-02-01 NOTE — Telephone Encounter (Signed)
Paramedicine Initial Assessment:  Housing:  In what kind of housing do you live? House/apt/trailer/shelter? apt  Do you rent/pay a mortgage/own? rent  Do you live with anyone? no  Are you currently worried about losing your housing? no  Within the past 12 months have you ever stayed outside, in a car, tent, a shelter, or temporarily with someone?  Within the past 12 months have you been unable to get utilities when it was really needed?  Social:  What is your current marital status? single  Do you have any children? daughter  Do you have family or friends who live locally? Yes daughter lives locally, also has a friend Daniel Joseph who lives locally.  Food:  Within the past 12 months were you ever worried that food would run out before you got money to buy more? no  Within the past 60months have you run out of food and didn't have money to buy more? No  Gets $150-160/month  Income:  What is your current source of income? Disability- [phone]  How hard is it for you to pay for the basics like food housing, medical care, and utilities? Not very hard  Do you have outstanding medical bills? Has outstanding sleep apnea  Insurance:  Are you currently insured? Medicaid  Do you have prescription coverage? yes  If no insurance, have you applied for coverage (Medicaid, disability, marketplace etc)?   Transportation:  Do you have transportation to your medical appointments? Friends come and take him.    If yes, how?   In the past 12 months has lack of transportation kept you from medical appts or from getting medications? no   In the past 12 months has lack of transportation kept you from meetings, work, or getting things you needed? no   Daily Health Needs: Do you have a working scale at home? Yes- weigh almost every day  How do you manage your medications at home? pillbox  Do you ever take your medications differently than prescribed? no  Do you have issues affording your  medications? no  If yes, has this ever prevented you from obtaining medications?  Do you have any concerns with mobility at home? no  Do you use any assistive devices at home or have PCS at home? no  Do you have a PCP? Yes Daniel Joseph   Are there any additional barriers you see to getting the care you need? no  CSW will continue to follow through paramedicine program and assist as needed.  Daniel Ny, LCSW Clinical Social Worker [facility] Desk#: [phone] Cell#: [phone]

## 2020-02-01 NOTE — Progress Notes (Signed)
Patient [facility]  Established Patient Follow Up   Subjective:  Patient ID: Daniel Joseph, male    DOB: January 11, 1961  Age: 59 y.o. MRN: [id number]  CC:  Chief Complaint  Patient presents with  . Follow-up    HTN (Former [name] NP patient)    HPI Daniel Joseph is a 59 year old male who presents for Follow Up today.   Past Medical History:  Diagnosis Date  . Benign essential HTN 01/19/2014   Renal Artery [place] 6/16:  No RAS, No AAA  . Cardiomyopathy, dilated, nonischemic (HCC)    EF 45-50% bye echo 08/2015 and EF 50-55% by echo 10/2017  . Chronic combined systolic and diastolic CHF (congestive heart failure) ([place]) 01/21/2014  . 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 Gout   . Mitral valve regurgitation    mild to moderate MR with MVP of ANMVL by echo 2019  . PVC's (premature ventricular contractions)    nonsustained VT as well as PVCs - no ICD indicated due to EF 40-45%  . Sleep apnea    pending second sleep study   . 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's initial office visit with me. He was previously seeing [name] for his PCP needs. Since his last office visit, He is doing well with no complaints. His blood pressures are elevated today. He reports fatigue, headaches, and dizziness. He recently had follow up with Dr. [name] on 01/28/2020. He denies fevers, chills, fatigue, recent infections, weight loss, and night sweats. He has not had any visual changes, and falls. No chest pain, heart palpitations, cough and shortness of breath reported. No reports of GI problems such as nausea, vomiting, diarrhea, and constipation. He has no reports of blood in stools, dysuria and hematuria. No depression or anxiety reported today. He denies  suicidal ideations, homicidal ideations, or auditory hallucinations. He is taking all medications as prescribed. He denies pain today.   Past Surgical History:  Procedure Laterality Date  . ABDOMINAL SURGERY  at birth  . BUBBLE STUDY  03/16/2019   Procedure: BUBBLE STUDY;  Surgeon: Larey Dresser, MD;  Location: [facility] ENDOSCOPY;  Service: Cardiovascular;;  . RIGHT/LEFT HEART CATH AND CORONARY ANGIOGRAPHY N/A 03/16/2019   Procedure: RIGHT/LEFT HEART CATH AND CORONARY ANGIOGRAPHY;  Surgeon: Larey Dresser, MD;  Location: [place] [facility] LAB;  Service: Cardiovascular;  Laterality: N/A;  . TEE WITHOUT CARDIOVERSION N/A 01/25/2014   Procedure: TRANSESOPHAGEAL ECHOCARDIOGRAM (TEE);  Surgeon: [name], MD;  Location: [place];  Service: Cardiovascular;  Laterality: N/A;  . TEE WITHOUT CARDIOVERSION N/A 09/14/2014   Procedure: TRANSESOPHAGEAL ECHOCARDIOGRAM (TEE);  Surgeon: Sueanne Margarita, MD;  Location: [place];  Service: Cardiovascular;  Laterality: N/A;  . TEE WITHOUT CARDIOVERSION N/A 03/16/2019   Procedure: TRANSESOPHAGEAL ECHOCARDIOGRAM (TEE);  Surgeon: Larey Dresser, MD;  Location: [facility] ENDOSCOPY;  Service: Cardiovascular;  Laterality: N/A;    Family History  Problem Relation Age of Onset  . Heart Problems Mother   . Stroke Mother   . Heart failure Mother   . Hypertension Mother   . Heart Problems Father   . Heart attack Father   . Hypertension Father   . Heart Problems Brother   .  Heart attack Brother   . Hypertension Brother   . Colon cancer Neg Hx   . Esophageal cancer Neg Hx   . Stomach cancer Neg Hx   . Colon polyps Neg Hx   . Rectal cancer Neg Hx     Social History   Socioeconomic History  . Marital status: Divorced    Spouse name: Not on file  . Number of children: Not on file  . Years of education: Not on file  . Highest education level: Not on file  Occupational History  . Not on file  Tobacco Use  . Smoking status: Never Smoker  . Smokeless  tobacco: Never Used  Substance and Sexual Activity  . Alcohol use: No  . Drug use: No  . Sexual activity: Yes  Other Topics Concern  . Not on file  Social History Narrative  . Not on file   Social Determinants of Health   Financial Resource Strain:   . Difficulty of Paying Living Expenses:   Food Insecurity:   . Worried About Charity fundraiser in the Last Year:   . Arboriculturist in the Last Year:   Transportation Needs:   . Film/video editor (Medical):   Marland Kitchen Lack of Transportation (Non-Medical):   Physical Activity:   . Days of Exercise per Week:   . Minutes of Exercise per Session:   Stress:   . Feeling of Stress :   Social Connections:   . Frequency of Communication with Friends and Family:   . Frequency of Social Gatherings with Friends and Family:   . Attends Religious Services:   . Active Member of Clubs or Organizations:   . Attends Archivist Meetings:   Marland Kitchen Marital Status:   Intimate Partner Violence:   . Fear of Current or Ex-Partner:   . Emotionally Abused:   Marland Kitchen Physically Abused:   . Sexually Abused:     Outpatient Medications Prior to Visit  Medication Sig Dispense Refill  . acetaminophen (TYLENOL) 650 MG CR tablet Take 1,300 mg by mouth every 8 (eight) hours as needed for pain.    Marland Kitchen amLODipine (NORVASC) 10 MG tablet Take 1 tablet (10 mg total) by mouth daily with lunch. 30 tablet 4  . aspirin EC 81 MG tablet Take 1 tablet (81 mg total) by mouth daily. 90 tablet 3  . benazepril (LOTENSIN) 5 MG tablet Take 1 tablet (5 mg total) by mouth daily. 30 tablet 3  . bisacodyl (DULCOLAX) 5 MG EC tablet Take 1 tablet (5 mg total) by mouth daily as needed for moderate constipation. 4 tablet 0  . carvedilol (COREG) 12.5 MG tablet Take 1 tablet (12.5 mg total) by mouth 2 (two) times daily. 60 tablet 3  . cetirizine (ZYRTEC) 10 MG tablet TAKE 1 TABLET (10 MG TOTAL) BY MOUTH DAILY. 30 tablet 11  . diclofenac Sodium (VOLTAREN) 1 % GEL Apply 4 g topically 4  (four) times daily as needed (sciatic nerve).    Marland Kitchen docusate sodium (COLACE) 100 MG capsule Take 1 capsule (100 mg total) by mouth daily. 10 capsule 0  . furosemide (LASIX) 20 MG tablet Take 1 tablet (20 mg total) by mouth daily. 30 tablet 4  . gabapentin (NEURONTIN) 300 MG capsule Take 1 capsule (300 mg total) by mouth 3 (three) times daily. 90 capsule 3  . isosorbide mononitrate (IMDUR) 60 MG 24 hr tablet Take 1 tablet (60 mg total) by mouth daily. 90 tablet 3  . ondansetron (ZOFRAN  ODT) 8 MG disintegrating tablet Take 1 tablet (8 mg total) by mouth every 8 (eight) hours as needed for nausea or vomiting. 20 tablet 0  . polyethylene glycol powder (GLYCOLAX/MIRALAX) 17 GM/SCOOP powder Take 255 g by mouth as directed. 119 g 0  . potassium chloride SA (K-DUR) 20 MEQ tablet Take 1 tablet (20 mEq total) by mouth daily. 90 tablet 3  . rosuvastatin (CRESTOR) 10 MG tablet Take 1 tablet (10 mg total) by mouth daily. 90 tablet 3  . spironolactone (ALDACTONE) 50 MG tablet Take 1 tablet (50 mg total) by mouth daily. 30 tablet 11  . thiamine 100 MG tablet Take 1 tablet (100 mg total) by mouth daily. 30 tablet 0  . tizanidine (ZANAFLEX) 2 MG capsule Take 2 mg by mouth 3 (three) times daily.    Marland Kitchen allopurinol (ZYLOPRIM) 100 MG tablet Take 1 tablet (100 mg total) by mouth 2 (two) times daily. 60 tablet 5  . Ensure Max Protein (ENSURE MAX PROTEIN) LIQD Take 330 mLs (11 oz total) by mouth daily. (Patient not taking: Reported on 02/01/2020) 330 mL 3  . Multiple Vitamin (MULTIVITAMIN WITH MINERALS) TABS tablet Take 1 tablet by mouth daily. (Patient not taking: Reported on 02/01/2020) 30 tablet 0  . traMADol (ULTRAM) 50 MG tablet Take 50 mg by mouth every 8 (eight) hours as needed for moderate pain.      No facility-administered medications prior to visit.    No Known Allergies  ROS Review of Systems  Constitutional: Positive for fatigue (occasional ).  HENT: Negative.   Eyes: Negative.   Respiratory: Negative.     Cardiovascular: Negative.   Gastrointestinal: Negative.   Endocrine: Negative.   Genitourinary: Negative.   Musculoskeletal: Positive for arthralgias (generalized joint pain).  Skin: Negative.   Allergic/Immunologic: Negative.   Neurological: Positive for dizziness (occasional ) and headaches (occasional ).       Bilateral neuropathy  Hematological: Negative.   Psychiatric/Behavioral: Negative.     Objective:    Physical Exam  Constitutional: He is oriented to person, place, and time. He appears well-developed and well-nourished.  HENT:  Head: Normocephalic and atraumatic.  Eyes: Conjunctivae are normal.  Cardiovascular: Normal rate, regular rhythm, normal heart sounds and intact distal pulses.  Pulmonary/Chest: Effort normal and breath sounds normal.  Abdominal: Soft. Bowel sounds are normal. He exhibits distension (obese).  Musculoskeletal:        General: Normal range of motion.     Cervical back: Normal range of motion and neck supple.  Neurological: He is alert and oriented to person, place, and time. He has normal reflexes.  Skin: Skin is warm and dry.  Psychiatric: He has a normal mood and affect. His behavior is normal. Judgment and thought content normal.  Nursing note and vitals reviewed.   BP (!) 142/96   Pulse 82   Temp 98.6 F (37 C)   Ht 6\' 1"  (1.854 m)   Wt 224 lb 3.2 oz (101.7 kg)   SpO2 99%   BMI 29.58 kg/m  Wt Readings from Last 3 Encounters:  02/01/20 224 lb 3.2 oz (101.7 kg)  01/28/20 225 lb (102.1 kg)  10/07/19 228 lb (103.4 kg)     There are no preventive care reminders to display for this patient.  There are no preventive care reminders to display for this patient.  Lab Results  Component Value Date   TSH 1.144 08/30/2015   Lab Results  Component Value Date   WBC 3.3 (L) 09/27/2019  HGB 13.4 09/27/2019   HCT 40.5 09/27/2019   MCV 94.0 09/27/2019   PLT 84 (L) 09/27/2019   Lab Results  Component Value Date   NA 141 01/28/2020    K 4.1 01/28/2020   CO2 25 01/28/2020   GLUCOSE 104 (H) 01/28/2020   BUN 14 01/28/2020   CREATININE 1.52 (H) 01/28/2020   BILITOT 1.2 08/29/2019   ALKPHOS 53 08/29/2019   AST 27 08/29/2019   ALT 38 08/29/2019   PROT 7.3 08/29/2019   ALBUMIN 3.9 08/29/2019   CALCIUM 9.1 01/28/2020   ANIONGAP 10 01/28/2020   GFR 55.18 (L) 03/08/2015   Lab Results  Component Value Date   CHOL 129 01/28/2020   Lab Results  Component Value Date   HDL 59 01/28/2020   Lab Results  Component Value Date   LDLCALC 57 01/28/2020   Lab Results  Component Value Date   TRIG 66 01/28/2020   Lab Results  Component Value Date   CHOLHDL 2.2 01/28/2020   Lab Results  Component Value Date   HGBA1C 5.4 11/13/2017      Assessment & Plan:   1. Hypertensive urgency Blood pressures are elevated today. Clonidine 0.2 mg given to patient in office and blood pressures remain elevated, but decrease significantly. He denies severe headaches, confusion, seizures, double vision, and blurred vision, nausea and vomiting. He will report to ED if he experiences these symptoms. Patient verbalized understanding.   - cloNIDine (CATAPRES) tablet 0.2 mg  2. Essential hypertension He will continue to take medications as prescribed, to decrease high sodium intake, excessive alcohol intake, increase potassium intake, smoking cessation, and increase physical activity of at least 30 minutes of cardio activity daily. He will continue to follow Heart Healthy or DASH diet. - POCT urinalysis dipstick  3. Arthritis, gouty - allopurinol (ZYLOPRIM) 100 MG tablet; Take 1 tablet (100 mg total) by mouth 2 (two) times daily.  Dispense: 60 tablet; Refill: 6  4. Neuropathy  5. Nausea  6. Follow up He will follow up in 2 weeks. Meds ordered this encounter  Medications  . allopurinol (ZYLOPRIM) 100 MG tablet    Sig: Take 1 tablet (100 mg total) by mouth 2 (two) times daily.    Dispense:  60 tablet    Refill:  6  . cloNIDine  (CATAPRES) tablet 0.2 mg   Orders Placed This Encounter  Procedures  . POCT urinalysis dipstick    Referral Orders  No referral(s) requested today    Kathe Becton,  MSN, FNP-BC [place] [address] [place], [place] [address] [phone] fax   Meds ordered this encounter  Medications  . allopurinol (ZYLOPRIM) 100 MG tablet    Sig: Take 1 tablet (100 mg total) by mouth 2 (two) times daily.    Dispense:  60 tablet    Refill:  6  . cloNIDine (CATAPRES) tablet 0.2 mg    Orders Placed This Encounter  Procedures  . POCT urinalysis dipstick    Referral Orders  No referral(s) requested today   Kathe Becton,  MSN, FNP-BC [place] [address] [place], [place] [address] [phone] fax  Problem List Items Addressed This Visit      Cardiovascular and Mediastinum   HTN (hypertension)   Relevant Orders   POCT urinalysis dipstick (Completed)    Other Visit Diagnoses    Hypertensive urgency    -  Primary   Relevant Medications   cloNIDine (CATAPRES) tablet 0.2 mg (Completed)   Arthritis, gouty       Relevant Medications   allopurinol (ZYLOPRIM) 100 MG tablet   Neuropathy       Nausea       Follow up          Meds ordered this encounter  Medications  . allopurinol (ZYLOPRIM) 100 MG tablet    Sig: Take 1 tablet (100 mg total) by mouth 2 (two) times daily.    Dispense:  60 tablet    Refill:  6  . cloNIDine (CATAPRES) tablet 0.2 mg    Follow-up: Return in about 2 weeks (around 02/15/2020).    Azzie Glatter, FNP

## 2020-02-02 DIAGNOSIS — R110 Nausea: Secondary | ICD-10-CM | POA: Insufficient documentation

## 2020-02-02 DIAGNOSIS — G629 Polyneuropathy, unspecified: Secondary | ICD-10-CM | POA: Insufficient documentation

## 2020-02-03 ENCOUNTER — Other Ambulatory Visit: Payer: Self-pay | Admitting: Family Medicine

## 2020-02-03 ENCOUNTER — Other Ambulatory Visit (HOSPITAL_COMMUNITY): Payer: Self-pay

## 2020-02-03 ENCOUNTER — Telehealth (HOSPITAL_COMMUNITY): Payer: Self-pay | Admitting: *Deleted

## 2020-02-03 DIAGNOSIS — I10 Essential (primary) hypertension: Secondary | ICD-10-CM

## 2020-02-03 NOTE — Telephone Encounter (Signed)
Left detailed message on Katies VM.

## 2020-02-03 NOTE — Telephone Encounter (Signed)
Restart spironolactone 25 mg daily with BMET 10 days.

## 2020-02-03 NOTE — Progress Notes (Signed)
Paramedicine Encounter    Patient ID: Daniel Joseph, male    DOB: [birth year], 59 y.o.   MRN: [id number]   Patient Care Team: [name], MD as PCP - General (Internal Medicine) Sueanne Margarita, MD as PCP - Cardiology (Cardiology) Larey Dresser, MD as PCP - Advanced Heart Failure (Cardiology)  Patient Active Problem List   Diagnosis Date Noted  . Neuropathy 02/02/2020  . Nausea 02/02/2020  . Acute exacerbation of CHF (congestive heart failure) ([place]) 08/29/2019  . Alcohol abuse 08/29/2019  . Hypoxic Respiratory failure, acute ([place]) 08/29/2019  . PVC's (premature ventricular contractions)   . Acute midline low back pain without sciatica 09/19/2016  . Acute gout 03/06/2016  . Left elbow pain 03/06/2016  . Heel spur 08/29/2015  . Nonsustained paroxysmal ventricular tachycardia ([place]) 01/26/2015  . Encounter for staple removal 08/23/2014  . HTN (hypertension) 03/02/2014  . Chronic combined systolic and diastolic CHF (congestive heart failure) ([place]) 03/02/2014  . Abnormal EKG 03/02/2014  . Hypertensive cardiomyopathy ([place]) 03/02/2014  . CKD (chronic kidney disease), stage III 02/15/2014  . Proteinuria 02/15/2014  . Neck nodule 02/01/2014  . Eye redness 02/01/2014  . Mitral valve regurgitation 01/21/2014  . 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 Nocturia 01/19/2014  . Headache 01/19/2014  . T wave inversion in EKG 01/19/2014    Current Outpatient Medications:  .  acetaminophen (TYLENOL) 650 MG CR tablet, Take 1,300 mg by mouth every 8 (eight) hours as needed for pain., Disp: , Rfl:  .  allopurinol (ZYLOPRIM) 100 MG tablet, Take 1 tablet (100 mg total) by mouth 2 (two) times daily., Disp: 60 tablet, Rfl: 6 .  amLODipine (NORVASC) 10 MG tablet, Take 1 tablet (10 mg total) by mouth daily with lunch., Disp: 30 tablet, Rfl: 4 .  aspirin EC 81 MG tablet, Take 1 tablet (81 mg total) by mouth daily., Disp: 90 tablet, Rfl: 3 .  benazepril (LOTENSIN) 5 MG tablet, Take 1 tablet (5  mg total) by mouth daily., Disp: 30 tablet, Rfl: 3 .  bisacodyl (DULCOLAX) 5 MG EC tablet, Take 1 tablet (5 mg total) by mouth daily as needed for moderate constipation., Disp: 4 tablet, Rfl: 0 .  cetirizine (ZYRTEC) 10 MG tablet, TAKE 1 TABLET (10 MG TOTAL) BY MOUTH DAILY., Disp: 30 tablet, Rfl: 11 .  diclofenac Sodium (VOLTAREN) 1 % GEL, Apply 4 g topically 4 (four) times daily as needed (sciatic nerve)., Disp: , Rfl:  .  docusate sodium (COLACE) 100 MG capsule, Take 1 capsule (100 mg total) by mouth daily., Disp: 10 capsule, Rfl: 0 .  Ensure Max Protein (ENSURE MAX PROTEIN) LIQD, Take 330 mLs (11 oz total) by mouth daily., Disp: 330 mL, Rfl: 3 .  furosemide (LASIX) 20 MG tablet, Take 1 tablet (20 mg total) by mouth daily., Disp: 30 tablet, Rfl: 4 .  gabapentin (NEURONTIN) 300 MG capsule, Take 1 capsule (300 mg total) by mouth 3 (three) times daily., Disp: 90 capsule, Rfl: 3 .  isosorbide mononitrate (IMDUR) 60 MG 24 hr tablet, Take 1 tablet (60 mg total) by mouth daily., Disp: 90 tablet, Rfl: 3 .  Multiple Vitamin (MULTIVITAMIN WITH MINERALS) TABS tablet, Take 1 tablet by mouth daily., Disp: 30 tablet, Rfl: 0 .  ondansetron (ZOFRAN ODT) 8 MG disintegrating tablet, Take 1 tablet (8 mg total) by mouth every 8 (eight) hours as needed for nausea or vomiting., Disp: 20 tablet, Rfl: 0 .  polyethylene glycol powder (GLYCOLAX/MIRALAX) 17 GM/SCOOP powder, Take 255 g by  mouth as directed., Disp: 119 g, Rfl: 0 .  potassium chloride SA (K-DUR) 20 MEQ tablet, Take 1 tablet (20 mEq total) by mouth daily., Disp: 90 tablet, Rfl: 3 .  tizanidine (ZANAFLEX) 2 MG capsule, Take 2 mg by mouth 3 (three) times daily., Disp: , Rfl:  .  carvedilol (COREG) 12.5 MG tablet, Take 1 tablet (12.5 mg total) by mouth 2 (two) times daily., Disp: 60 tablet, Rfl: 3 .  rosuvastatin (CRESTOR) 10 MG tablet, Take 1 tablet (10 mg total) by mouth daily., Disp: 90 tablet, Rfl: 3 .  spironolactone (ALDACTONE) 50 MG tablet, Take 1 tablet (50  mg total) by mouth daily., Disp: 30 tablet, Rfl: 11 .  thiamine 100 MG tablet, Take 1 tablet (100 mg total) by mouth daily. (Patient not taking: Reported on 02/03/2020), Disp: 30 tablet, Rfl: 0 No Known Allergies    Social History   Socioeconomic History  . Marital status: Divorced    Spouse name: Not on file  . Number of children: Not on file  . Years of education: Not on file  . Highest education level: Not on file  Occupational History  . Not on file  Tobacco Use  . Smoking status: Never Smoker  . Smokeless tobacco: Never Used  Substance and Sexual Activity  . Alcohol use: No  . Drug use: No  . Sexual activity: Yes  Other Topics Concern  . Not on file  Social History Narrative  . Not on file   Social Determinants of Health   Financial Resource Strain:   . Difficulty of Paying Living Expenses:   Food Insecurity:   . Worried About Charity fundraiser in the Last Year:   . Arboriculturist in the Last Year:   Transportation Needs:   . Film/video editor (Medical):   Marland Kitchen Lack of Transportation (Non-Medical):   Physical Activity:   . Days of Exercise per Week:   . Minutes of Exercise per Session:   Stress:   . Feeling of Stress :   Social Connections:   . Frequency of Communication with Friends and Family:   . Frequency of Social Gatherings with Friends and Family:   . Attends Religious Services:   . Active Member of Clubs or Organizations:   . Attends Archivist Meetings:   Marland Kitchen Marital Status:   Intimate Partner Violence:   . Fear of Current or Ex-Partner:   . Emotionally Abused:   Marland Kitchen Physically Abused:   . Sexually Abused:     Physical Exam      Future Appointments  Date Time Provider [place]  02/16/2020  9:50 AM Vevelyn Francois, NP [facility] None  02/21/2020 10:00 AM [facility] PA/NP [facility] None    BP (!) 136/100   Pulse 64   Temp 98.5 F (36.9 C)   Resp 16   Wt 220 lb (99.8 kg)   SpO2 98%   BMI 29.03 kg/m   Weight  yesterday-220-225 Last visit weight-225 @ CLINIC   First visit with pt, pt lives alone in apt.  He has daughter in [place] and 3 small grandchildren.  He drives himself sometimes to places/appointments and other times he gets friend/family to take him. He reports that times he doesn't drive is b/c of the meds make him feel tired/weak.  He has medicaid, right now no issues p/u his meds nor the co-pays. Goes to [employer].  At his last clinic visit, he had became very light headed and his  b/p dropped low.  He checked his b/p today--150's/102 He denies feeling bad at that time.  He did take his meds and he took his b/p about a min after he took his meds. Around 715 this morning-im here at 3--  He reports in the past he would run out of medicine and the pharmacy would say they reached out to provider office but his meds would not get refilled so he would have to go without.  Educated him on calling clinic    --he reports allopurinol and gabapentin is at pharmacy --he does not have spiro--he reports not having that in several months--last sent to [facility] but he is now at [facility] for the past 3 months or so and has not p/u spiro in that time frame.  He also does not have thiamine--he will p/u as well.   Pt reports he now feels about normal on a day to day basis.  He denies sob upon exertion.  He is able to lay flat.  He reports sleep study was done some time ago and said they told him he had sleep apnea but then he had study done 2 years ago and test was negative. Will need to look further into that. ---spoke to jasmine and she will look into this.  Pt denies dizziness since his clinic visit last week.  Pt denies c/p.  Pt is weighing daily. Gave him a sheet to write it down on. Reviewed with him sodium limits and fluid intake limits and when to call clinic or myself for weight gain.  Jasmine called me back regarding spiro and he is to restart 25mg  daily and he has clinic visit next week and they  will take labs at that time too.    Marylouise Stacks, [facility] Paramedic  02/03/20

## 2020-02-03 NOTE — Telephone Encounter (Signed)
Katie with paramedicine called to report pts bp 138/100.pt has not been taking spironolactone pt has been off of spiro for a few months. Pt is asymptomatic and no longer dizzy. Katie wants to know if pt should restart spiro or make any other med changes,.  Routed to [place]

## 2020-02-08 ENCOUNTER — Telehealth (HOSPITAL_COMMUNITY): Payer: Self-pay

## 2020-02-10 ENCOUNTER — Telehealth (HOSPITAL_COMMUNITY): Payer: Self-pay

## 2020-02-10 NOTE — Telephone Encounter (Signed)
Called pt to sch home visit, he did not answer and his VM was full so not able to LVM.   Marylouise Stacks, EMT-Paramedic  02/10/20

## 2020-02-10 NOTE — Telephone Encounter (Signed)
Called pt again but no answer. Unable to LVM. Will try again next week.   Marylouise Stacks, EMT-Paramedic  02/10/20

## 2020-02-15 ENCOUNTER — Other Ambulatory Visit (HOSPITAL_COMMUNITY): Payer: Self-pay | Admitting: *Deleted

## 2020-02-15 ENCOUNTER — Other Ambulatory Visit: Payer: Self-pay | Admitting: Family Medicine

## 2020-02-15 DIAGNOSIS — I10 Essential (primary) hypertension: Secondary | ICD-10-CM

## 2020-02-15 MED ORDER — SPIRONOLACTONE 25 MG PO TABS: 25.0000 mg | ORAL_TABLET | Freq: Every day | ORAL | 3 refills | Status: DC

## 2020-02-16 ENCOUNTER — Other Ambulatory Visit (HOSPITAL_COMMUNITY): Payer: Self-pay | Admitting: *Deleted

## 2020-02-16 ENCOUNTER — Ambulatory Visit: Payer: Medicaid Other | Admitting: Nurse Practitioner

## 2020-02-16 ENCOUNTER — Telehealth (HOSPITAL_COMMUNITY): Payer: Self-pay

## 2020-02-16 NOTE — Telephone Encounter (Signed)
Spoke with pt to f/u with need of spiro filled-he reports the pharmacy states they need the ok from the doctor since he is on potassium too. I advised the pharmacy that his HF doctor wants him to be on both and he is being followed closely with lab work often as well.  Pharmacy will fill it and I called pt back to let him know.  He does have appoint on Monday, the plan is to keep that appointment and he doesn't mind coming in at a later date for labs.   Marylouise Stacks, EMT-Paramedic  02/16/20

## 2020-02-21 ENCOUNTER — Ambulatory Visit (HOSPITAL_COMMUNITY)
Admission: RE | Admit: 2020-02-21 | Discharge: 2020-02-21 | Disposition: A | Discharge status: Home / Self Care | Payer: Medicaid Other | Source: Ambulatory Visit | Attending: Internal Medicine | Admitting: Internal Medicine

## 2020-02-21 ENCOUNTER — Other Ambulatory Visit (HOSPITAL_COMMUNITY): Payer: Self-pay

## 2020-02-21 ENCOUNTER — Encounter (HOSPITAL_COMMUNITY): Payer: Self-pay

## 2020-02-21 ENCOUNTER — Ambulatory Visit: Payer: Medicaid Other | Admitting: Nurse Practitioner

## 2020-02-21 ENCOUNTER — Other Ambulatory Visit: Payer: Self-pay

## 2020-02-21 VITALS — BP 176/122 | HR 86 | Wt 220.6 lb

## 2020-02-21 DIAGNOSIS — Z791 Long term (current) use of non-steroidal anti-inflammatories (NSAID): Secondary | ICD-10-CM | POA: Insufficient documentation

## 2020-02-21 DIAGNOSIS — Z7982 Long term (current) use of aspirin: Secondary | ICD-10-CM | POA: Insufficient documentation

## 2020-02-21 DIAGNOSIS — Z9114 Patient's other noncompliance with medication regimen: Secondary | ICD-10-CM | POA: Insufficient documentation

## 2020-02-21 DIAGNOSIS — I10 Essential (primary) hypertension: Secondary | ICD-10-CM | POA: Diagnosis not present

## 2020-02-21 DIAGNOSIS — I428 Other cardiomyopathies: Secondary | ICD-10-CM | POA: Diagnosis not present

## 2020-02-21 DIAGNOSIS — I5042 Chronic combined systolic (congestive) and diastolic (congestive) heart failure: Secondary | ICD-10-CM | POA: Diagnosis not present

## 2020-02-21 DIAGNOSIS — I340 Nonrheumatic mitral (valve) insufficiency: Secondary | ICD-10-CM | POA: Diagnosis not present

## 2020-02-21 DIAGNOSIS — M109 Gout, unspecified: Secondary | ICD-10-CM | POA: Diagnosis not present

## 2020-02-21 DIAGNOSIS — Z79899 Other long term (current) drug therapy: Secondary | ICD-10-CM | POA: Diagnosis not present

## 2020-02-21 DIAGNOSIS — I2510 Atherosclerotic heart disease of native coronary artery without angina pectoris: Secondary | ICD-10-CM | POA: Diagnosis not present

## 2020-02-21 DIAGNOSIS — Z8249 Family history of ischemic heart disease and other diseases of the circulatory system: Secondary | ICD-10-CM | POA: Diagnosis not present

## 2020-02-21 DIAGNOSIS — I110 Hypertensive heart disease with heart failure: Secondary | ICD-10-CM | POA: Diagnosis not present

## 2020-02-21 DIAGNOSIS — I341 Nonrheumatic mitral (valve) prolapse: Secondary | ICD-10-CM | POA: Diagnosis not present

## 2020-02-21 DIAGNOSIS — I5032 Chronic diastolic (congestive) heart failure: Secondary | ICD-10-CM | POA: Diagnosis not present

## 2020-02-21 LAB — BASIC METABOLIC PANEL
Anion gap: 10 (ref 5–15)
BUN: 10 mg/dL (ref 6–20)
CO2: 24 mmol/L (ref 22–32)
Calcium: 9.2 mg/dL (ref 8.9–10.3)
Chloride: 107 mmol/L (ref 98–111)
Creatinine, Ser: 1.3 mg/dL — ABNORMAL HIGH (ref 0.61–1.24)
GFR calc Af Amer: >60 mL/min (ref >60)
GFR calc non Af Amer: >60 mL/min (ref >60)
Glucose, Bld: 100 mg/dL — ABNORMAL HIGH (ref 70–99)
Potassium: 3.9 mmol/L (ref 3.5–5.1)
Sodium: 141 mmol/L (ref 135–145)

## 2020-02-21 MED ORDER — CARVEDILOL 12.5 MG PO TABS: 25.0000 mg | ORAL_TABLET | Freq: Two times a day (BID) | ORAL | 3 refills | Status: DC

## 2020-02-21 MED ORDER — BENAZEPRIL HCL 10 MG PO TABS: 10.0000 mg | ORAL_TABLET | Freq: Every day | ORAL | 3 refills | Status: DC

## 2020-02-21 NOTE — Progress Notes (Signed)
PCP: [name], MD Cardiology: Dr. [name] HF Cardiology: Dr. [name]  60 y.o. 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 [name] for evaluation of CHF. 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  Cardiolite in 5/16 showed no ischemia but possible apical infarction.  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  PYP scan was read as equivocal in 5/20 but probably not suggestive of transthyretin amyloidosis.   TEE was done in 6/20 to assess severity of MR.  EF was 50% with mild mitral valve prolapse and moderate MR.  RHC/LHC showed nonobstructive CAD, pulmonary venous hypertension, relatively controlled filling pressures. 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   Echo in 3/21 showed EF 55-60%, normal RV, severe LAE, moderate-severe MR.    Patient's BP has been poorly controlled over time.  He has not been consistent with medication compliance.  Renal artery dopplers were unremarkable in 6/16.  Recently seen in clinic 5/14 for f/u for CHF and had presyncopal event and found to be orthostatic.  SBP in 70s (was 104/82 when he first arrived).  He was given 500 cc NS in the office with resolution of orthostatic symptoms and improvement in BP.  He had actually ran out of benzepril, doxazosin, and hydralazine.  His compliance with his complicated medication regimen had been quite poor, and it was suspected additional BP meds had been added over time in the setting of him not regularly taking his other BP meds. Hydralazine or doxazosin were both discontinued. Lasix was reduced to 20 mg daily. Coreg, spironolactone, and amlodipine were continued. He is now followed by paramedicine.   He also was ordered to get a high resolution CT  to assess for pulmonary sarcoidosis (ACE level not elevated). This has not yet been completed. Denied by insurance.   Presents to clinic today for f/u. BP markedly elevated, 176/122. BP was rechecked after period of rest and remained elevated at 160/118. Reports full med compliance. Took all of his  AM meds ~1 hr ago. His BP at home has been averaging in the 024O systolic. He denies any further near syncope or dizziness. No CP, dyspnea or HAs. Wt is actually down 5 lb from previous visit, 225>>220 lb.    Labs (5/20): K 3.5, creatinine 1.23 Labs (6/20): Urine immunofixation negative, K 4.2, creatinine 1.36 Labs (7/20): creatinine 1.43 Labs (9/20): uric acid 7.5, LDL 67 Labs (1/21): K 3.9, creatinine 1.33, BNP 388, ACE level low Labs (5/21): K 4.1, creatinine 1.52  ECG (personally reviewed): not performed   PMH: 1. Gout 2. H/o NSVT/PVCs 3. 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  Seen on multiple echoes, first in 2015.  4. HTN: Renal artery dopplers in 6/16 were unremarkable. Sleep study about 2 years ago was negative per patient's report.  5. Mitral regurgitation: TEE in 2015 with mild to moderate MR.  - Echo (2/20): Moderate MR.  - Echo (5/20): Possible severe MR - TEE (6/20): Mild mitral valve prolapse with moderate MR.  - Echo (3/21): Moderate to severe MR.  6. 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  - Cardiolite (5/16): Possible apical infarct, no ischemia.  - Echo (7/19): EF 50-55%.  - Echo (2/20): EF 50-55%, moderate MR, TV fibroelastoma.  -  PYP scan (5/20): Grade 1 visually, H/CL 1.23.  Read as equivocal but likely negative for TTR amyloidosis.  - Echo (5/20): EF 40-45%, mildly dilated LV with mild LVH, normal RV size and systolic function, possible severe MR, moderate TR, 1 cm nodule on TV may be fibroelastoma, PASP 79 mmHg.  - TEE (6/20): EF 50%, mild LV dilation, mild LVH, moderate LAE, tricuspid valve  mass likely fibroelastoma, mild MVP with moderate MR.  - LHC/RHC (6/20): 50% LAD, 30% pRCA; mean RA 4, PA 55/21 mean 34, mean PCWP 17, CI 2.66, PVR 2.8 WU.  - Cardiac MRI (8/20): EF 53%, mild LVH, inferoseptal/inferior mid-wall LGE (?cardiac sarcoidosis, does not look like amyloidosis), mild RV dilation with RVEF 54%, moderate eccentric MR, severe LAE. - Echo (3/21):  EF 55-60%, normal RV, severe LAE, moderate-severe MR.  FH:  Father with MI at 53, brother with MI at 38, mother with CHF, MI.  HTN in multiple family members.   Social History   Socioeconomic History  . Marital status: Divorced    Spouse name: Not on file  . Number of children: Not on file  . Years of education: Not on file  . Highest education level: Not on file  Occupational History  . Not on file  Tobacco Use  . Smoking status: Never Smoker  . Smokeless tobacco: Never Used  Substance and Sexual Activity  . Alcohol use: No  . Drug use: No  . Sexual activity: Yes  Other Topics Concern  . Not on file  Social History Narrative  . Not on file   Social Determinants of Health   Financial Resource Strain:   . Difficulty of Paying Living Expenses:   Food Insecurity:   . Worried About Charity fundraiser in the Last Year:   . Arboriculturist in the Last Year:   Transportation Needs:   . Film/video editor (Medical):   Marland Kitchen Lack of Transportation (Non-Medical):   Physical Activity:   . Days of Exercise per Week:   . Minutes of Exercise per Session:   Stress:   . Feeling of Stress :   Social Connections:   . Frequency of Communication with Friends and Family:   . Frequency of Social Gatherings with Friends and Family:   . Attends Religious Services:   . Active Member of Clubs or Organizations:   . Attends Archivist Meetings:   Marland Kitchen Marital Status:   Intimate Partner Violence:   . Fear of Current or Ex-Partner:   . Emotionally Abused:   Marland Kitchen Physically Abused:   . Sexually Abused:    ROS: All systems  reviewed and negative except as per HPI.   Current Outpatient Medications  Medication Sig Dispense Refill  . allopurinol (ZYLOPRIM) 100 MG tablet Take 1 tablet (100 mg total) by mouth 2 (two) times daily. 60 tablet 6  . amLODipine (NORVASC) 10 MG tablet Take 1 tablet (10 mg total) by mouth daily with lunch. 30 tablet 4  . aspirin EC 81 MG tablet Take 1 tablet (81 mg total) by mouth daily. 90 tablet 3  . benazepril (LOTENSIN) 5 MG tablet Take 1 tablet (5 mg total) by mouth daily. 30 tablet 3  . carvedilol (COREG) 12.5 MG tablet Take 1 tablet (12.5 mg total) by mouth 2 (two) times daily. 60 tablet 3  . cetirizine (ZYRTEC) 10 MG tablet TAKE 1 TABLET (10 MG TOTAL) BY MOUTH DAILY. 30 tablet 11  . furosemide (LASIX) 20 MG tablet Take  1 tablet (20 mg total) by mouth daily. 30 tablet 4  . gabapentin (NEURONTIN) 300 MG capsule Take 1 capsule (300 mg total) by mouth 3 (three) times daily. 90 capsule 3  . isosorbide mononitrate (IMDUR) 60 MG 24 hr tablet Take 1 tablet (60 mg total) by mouth daily. 90 tablet 3  . Multiple Vitamin (MULTIVITAMIN WITH MINERALS) TABS tablet Take 1 tablet by mouth daily. 30 tablet 0  . potassium chloride SA (K-DUR) 20 MEQ tablet Take 1 tablet (20 mEq total) by mouth daily. 90 tablet 3  . rosuvastatin (CRESTOR) 10 MG tablet Take 1 tablet (10 mg total) by mouth daily. 90 tablet 3  . spironolactone (ALDACTONE) 25 MG tablet Take 1 tablet (25 mg total) by mouth daily. 30 tablet 3  . acetaminophen (TYLENOL) 650 MG CR tablet Take 1,300 mg by mouth every 8 (eight) hours as needed for pain.    . bisacodyl (DULCOLAX) 5 MG EC tablet Take 1 tablet (5 mg total) by mouth daily as needed for moderate constipation. 4 tablet 0  . diclofenac Sodium (VOLTAREN) 1 % GEL Apply 4 g topically 4 (four) times daily as needed (sciatic nerve).    Marland Kitchen docusate sodium (COLACE) 100 MG capsule Take 1 capsule (100 mg total) by mouth daily. (Patient not taking: Reported on 02/21/2020) 10 capsule 0  . Ensure Max  Protein (ENSURE MAX PROTEIN) LIQD Take 330 mLs (11 oz total) by mouth daily. 330 mL 3  . ondansetron (ZOFRAN ODT) 8 MG disintegrating tablet Take 1 tablet (8 mg total) by mouth every 8 (eight) hours as needed for nausea or vomiting. (Patient not taking: Reported on 02/21/2020) 20 tablet 0  . polyethylene glycol powder (GLYCOLAX/MIRALAX) 17 GM/SCOOP powder Take 255 g by mouth as directed. (Patient not taking: Reported on 02/21/2020) 119 g 0  . thiamine 100 MG tablet Take 1 tablet (100 mg total) by mouth daily. (Patient not taking: Reported on 02/03/2020) 30 tablet 0  . tizanidine (ZANAFLEX) 2 MG capsule Take 2 mg by mouth 3 (three) times daily.     No current facility-administered medications for this encounter.   BP (!) 176/122   Pulse 86   Wt 100.1 kg (220 lb 9.6 oz)   SpO2 98%   BMI 29.10 kg/m  PHYSICAL EXAM: General:  Well appearing. No respiratory difficulty HEENT: normal Neck: supple. no JVD. Carotids 2+ bilat; no bruits. No lymphadenopathy or thyromegaly appreciated. Cor: PMI nondisplaced. Regular rate & rhythm. No rubs, gallops or murmurs. Lungs: clear Abdomen: soft, nontender, nondistended. No hepatosplenomegaly. No bruits or masses. Good bowel sounds. Extremities: no cyanosis, clubbing, rash, edema Neuro: alert & oriented x 3, cranial nerves grossly intact. moves all 4 extremities w/o difficulty. Affect pleasant.   Assessment/Plan:  1. HTN: Negative renal artery dopplers and sleep study in past.  On multiple agents. Historically poorly controlled, think medication noncompliance plays a large role.  He is now being followed by para medicine. Hypertensive, despite reported med compliance. Denies any further hypotension and no orthostatic symptoms. - Increase benazepril to 10 mg daily  - Increase Coreg to 25 mg bid - Continue spiro 25 mg - Continue amlodipine 10 mg - Continue Lasix 20 mg - continue to hold off on hydralazine, doxazosin and clonidine for now.  - paramedicine will  re-visit later this week to recheck BP - Check BMP today and again in 7 days - We discussed importance of strict med compliance.  3. 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  Echo in 5/20 showed EF 40-45% with normal RV, possible severe mitral regurgitation, and severe pulmonary hypertension.  PYP scan in 5/20 was read as equivocal, but I suspect this is not suggestive of transthyretin amyloidosis. TEE in 5/20 showed EF 50%, mild LV dilation, moderate MR.  RHC/LHC showed mildly elevated PCWP, pulmonary venous hypertension, and nonobstructive CAD.  Cardiac MRI in 8/20 showed EF 53% and had an LGE pattern that was suggestive of possible cardiac sarcoidosis (not amyloidosis).  Echo in 3/21 showed EF up to 55-60%.  On exam today, he is not volume overloaded. Wt down 5 lb from last visit. Stable NYHA class II symptoms.  - Continue lasix 20 mg daily  - Increase benazepril to 10 mg  - Increase Coreg top 25 mg bid  - Continue spironolactone 25 mg daily.  - Given concern for possible cardiac sarcoidosis,  high resolution CT to assess for pulmonary sarcoidosis was recommended by denied by insurance.  4. Mitral regurgitation: ?severe on 5/20 TTE but TEE in 6/20 showed mild mitral valve prolapse with only moderate MR.  Cardiac MRI also suggested moderate MR. TTE in 3/21 suggested moderate-severe MR.  - Follow for now, may be eventual candidate for MV repair in future.    5. 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  6. 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  - continue Crestor, check lipids today.    F/u BMP in 1 week. W/u w/ pharmD in 4 weeks  Lyda Jester, PA-C  02/21/2020

## 2020-02-21 NOTE — Patient Instructions (Signed)
INCREASE Coreg to 25 mg, one tab twice daily INCREASE Benazepril to 10 mg, one tab daily  Labs today We will only contact you if something comes back abnormal or we need to make some changes. Otherwise no news is good news!  Labs need in 7-10 days  Your physician recommends that you schedule a follow-up appointment in: 4 weeks with Selinda Michaels D Your physician recommends that you schedule a follow-up appointment in: 3 months with Dr [name]  Do the following things EVERYDAY: 1) Weigh yourself in the morning before breakfast. Write it down and keep it in a log. 2) Take your medicines as prescribed 3) Eat low salt foods--Limit salt (sodium) to 2000 mg per day.  4) Stay as active as you can everyday 5) Limit all fluids for the day to less than 2 liters  At the [facility], you and your health needs are our priority. As part of our continuing mission to provide you with exceptional heart care, we have created designated Provider Care Teams. These Care Teams include your primary Cardiologist (physician) and Advanced Practice Providers (APPs- Physician Assistants and Nurse Practitioners) who all work together to provide you with the care you need, when you need it.   You may see any of the following providers on your designated Care Team at your next follow up: Marland Kitchen Dr Glori Bickers . Dr Loralie Champagne . Darrick Grinder, NP . Lyda Jester, PA . Audry Riles, PharmD   Please be sure to bring in all your medications bottles to every appointment.

## 2020-02-21 NOTE — Progress Notes (Signed)
Paramedicine Encounter   Patient ID: Daniel Joseph , male,   DOB: Aug 21, 1961,58 y.o.,  MRN: [id number]   Met patient in clinic today with provider.   B/p-176/122 p-86 sp02-98 B/p recheck-160/118-rt arm Left arm-142/102 Weight @ clinic-220 Weight @ home- 218-220  Pt reports he was finally able to get his meds p/u.  He did take his meds this morning at 915 or so.   last b/p check at home was on Saturday and he reports 150s/90s. He has restarted the spiro late last week.  Pt denies any dizziness.  No h/a, no c/p, no sob. No edema noted.  Will f/u later this week for a home b/p check.  Carvedilol will be increased to 59m BID.  Benazepril increased to 151mdaily.  [place]ouise [place]acksEM[place]/03/2020

## 2020-02-24 ENCOUNTER — Other Ambulatory Visit (HOSPITAL_COMMUNITY): Payer: Self-pay

## 2020-02-24 NOTE — Progress Notes (Signed)
Paramedicine Encounter    Patient ID: Daniel Joseph, male    DOB: 1961-06-16, 59 y.o.   MRN: [id number]   Patient Care Team: [name], MD as PCP - General (Internal Medicine) Sueanne Margarita, MD as PCP - Cardiology (Cardiology) Larey Dresser, MD as PCP - Advanced Heart Failure (Cardiology)  Patient Active Problem List   Diagnosis Date Noted   Neuropathy 02/02/2020   Nausea 02/02/2020   Acute exacerbation of CHF (congestive heart failure) ([place]) 08/29/2019   Alcohol abuse 08/29/2019   Hypoxic Respiratory failure, acute ([place]) 08/29/2019   PVC's (premature ventricular contractions)    HTN (hypertension) 03/02/2014   Chronic combined systolic and diastolic CHF (congestive heart failure) ([place]) 03/02/2014   Hypertensive cardiomyopathy ([place]) 03/02/2014   CKD (chronic kidney disease), stage III 02/15/2014   Neck nodule 02/01/2014   Eye redness 02/01/2014   Mitral valve regurgitation 01/21/2014   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    Current Outpatient Medications:    acetaminophen (TYLENOL) 650 MG CR tablet, Take 1,300 mg by mouth every 8 (eight) hours as needed for pain., Disp: , Rfl:    allopurinol (ZYLOPRIM) 100 MG tablet, Take 1 tablet (100 mg total) by mouth 2 (two) times daily., Disp: 60 tablet, Rfl: 6   amLODipine (NORVASC) 10 MG tablet, Take 1 tablet (10 mg total) by mouth daily with lunch., Disp: 30 tablet, Rfl: 4   aspirin EC 81 MG tablet, Take 1 tablet (81 mg total) by mouth daily., Disp: 90 tablet, Rfl: 3   benazepril (LOTENSIN) 10 MG tablet, Take 1 tablet (10 mg total) by mouth daily., Disp: 30 tablet, Rfl: 3   bisacodyl (DULCOLAX) 5 MG EC tablet, Take 1 tablet (5 mg total) by mouth daily as needed for moderate constipation., Disp: 4 tablet, Rfl: 0   carvedilol (COREG) 12.5 MG tablet, Take 2 tablets (25 mg total) by mouth 2 (two) times daily., Disp: 60 tablet, Rfl: 3   cetirizine (ZYRTEC) 10 MG tablet, TAKE 1 TABLET (10 MG TOTAL) BY MOUTH  DAILY., Disp: 30 tablet, Rfl: 11   diclofenac Sodium (VOLTAREN) 1 % GEL, Apply 4 g topically 4 (four) times daily as needed (sciatic nerve)., Disp: , Rfl:    docusate sodium (COLACE) 100 MG capsule, Take 1 capsule (100 mg total) by mouth daily., Disp: 10 capsule, Rfl: 0   Ensure Max Protein (ENSURE MAX PROTEIN) LIQD, Take 330 mLs (11 oz total) by mouth daily., Disp: 330 mL, Rfl: 3   furosemide (LASIX) 20 MG tablet, Take 1 tablet (20 mg total) by mouth daily., Disp: 30 tablet, Rfl: 4   gabapentin (NEURONTIN) 300 MG capsule, Take 1 capsule (300 mg total) by mouth 3 (three) times daily., Disp: 90 capsule, Rfl: 3   isosorbide mononitrate (IMDUR) 60 MG 24 hr tablet, Take 1 tablet (60 mg total) by mouth daily., Disp: 90 tablet, Rfl: 3   Multiple Vitamin (MULTIVITAMIN WITH MINERALS) TABS tablet, Take 1 tablet by mouth daily., Disp: 30 tablet, Rfl: 0   polyethylene glycol powder (GLYCOLAX/MIRALAX) 17 GM/SCOOP powder, Take 255 g by mouth as directed., Disp: 119 g, Rfl: 0   potassium chloride SA (K-DUR) 20 MEQ tablet, Take 1 tablet (20 mEq total) by mouth daily., Disp: 90 tablet, Rfl: 3   spironolactone (ALDACTONE) 25 MG tablet, Take 1 tablet (25 mg total) by mouth daily., Disp: 30 tablet, Rfl: 3   thiamine 100 MG tablet, Take 1 tablet (100 mg total) by mouth daily., Disp: 30 tablet, Rfl: 0  tizanidine (ZANAFLEX) 2 MG capsule, Take 2 mg by mouth 3 (three) times daily., Disp: , Rfl:    ondansetron (ZOFRAN ODT) 8 MG disintegrating tablet, Take 1 tablet (8 mg total) by mouth every 8 (eight) hours as needed for nausea or vomiting. (Patient not taking: Reported on 02/21/2020), Disp: 20 tablet, Rfl: 0   rosuvastatin (CRESTOR) 10 MG tablet, Take 1 tablet (10 mg total) by mouth daily., Disp: 90 tablet, Rfl: 3 No Known Allergies    Social History   Socioeconomic History   Marital status: Divorced    Spouse name: Not on file   Number of children: Not on file   Years of education: Not on file    Highest education level: Not on file  Occupational History   Not on file  Tobacco Use   Smoking status: Never Smoker   Smokeless tobacco: Never Used  Vaping Use   Vaping Use: Never used  Substance and Sexual Activity   Alcohol use: No   Drug use: No   Sexual activity: Yes  Other Topics Concern   Not on file  Social History Narrative   Not on file   Social Determinants of Health   Financial Resource Strain:    Difficulty of Paying Living Expenses:   Food Insecurity:    Worried About Charity fundraiser in the Last Year:    Arboriculturist in the Last Year:   Transportation Needs:    Film/video editor (Medical):    Lack of Transportation (Non-Medical):   Physical Activity:    Days of Exercise per Week:    Minutes of Exercise per Session:   Stress:    Feeling of Stress :   Social Connections:    Frequency of Communication with Friends and Family:    Frequency of Social Gatherings with Friends and Family:    Attends Religious Services:    Active Member of Clubs or Organizations:    Attends Archivist Meetings:    Marital Status:   Intimate Partner Violence:    Fear of Current or Ex-Partner:    Emotionally Abused:    Physically Abused:    Sexually Abused:     Physical Exam      Future Appointments  Date Time Provider [place]  02/28/2020  8:30 AM [facility] LAB [facility] None  03/22/2020 11:00 AM [facility] PHARMACY [facility] None  05/30/2020  9:20 AM Larey Dresser, MD [facility] None    BP (!) 170/120    Pulse 76    Resp 16    Wt 220 lb (99.8 kg)    SpO2 98%    BMI 29.03 kg/m  B/p recheck-164/120 Weight yesterday-220 Last visit weight-220 @ home   Pt reports he is doing ok, he has started the new doses of the med changes and he denies any dizziness. No c/p, no sob.  B/p still elevated. But he has been piddling around outside this morning. He is very sweaty already, it is very humid out already this morning and he  reports he is hot natured anyways. He denies any complaints.  Took his meds this morning around 8am.  B/p recheck still high.  He ate fried chicken last night for dinner.  I tried calling triage line but no answer.  I have advised him to try to relax today, he said he can feel it when his meds kick in b/c it makes him tired and sleepy so he does go lay down when that happens. I  have asked him when that happens to check his b/p and to call me to let me know those numbers.  Will try again to reach the clinic.  ---Contacted Brittainy, PA and she advised for him to increase his benazepril to 20mg  daily. I will reach out to pt to advise him of the change.  I did speak to him and he will start that tomor. He said he rechecked his b/p and it was 149/92 later on in the morning.    Marylouise Stacks, [place] [facility] Paramedic  02/24/20

## 2020-02-28 ENCOUNTER — Other Ambulatory Visit: Payer: Self-pay

## 2020-02-28 ENCOUNTER — Ambulatory Visit (HOSPITAL_COMMUNITY)
Admission: RE | Admit: 2020-02-28 | Discharge: 2020-02-28 | Disposition: A | Discharge status: Home / Self Care | Payer: Medicaid Other | Source: Ambulatory Visit | Attending: Internal Medicine | Admitting: Internal Medicine

## 2020-02-28 DIAGNOSIS — I5042 Chronic combined systolic (congestive) and diastolic (congestive) heart failure: Secondary | ICD-10-CM

## 2020-02-28 LAB — BASIC METABOLIC PANEL
Anion gap: 9 (ref 5–15)
BUN: 17 mg/dL (ref 6–20)
CO2: 23 mmol/L (ref 22–32)
Calcium: 9.2 mg/dL (ref 8.9–10.3)
Chloride: 106 mmol/L (ref 98–111)
Creatinine, Ser: 1.23 mg/dL (ref 0.61–1.24)
GFR calc Af Amer: >60 mL/min (ref >60)
GFR calc non Af Amer: >60 mL/min (ref >60)
Glucose, Bld: 95 mg/dL (ref 70–99)
Potassium: 4.4 mmol/L (ref 3.5–5.1)
Sodium: 138 mmol/L (ref 135–145)

## 2020-03-01 ENCOUNTER — Telehealth (HOSPITAL_COMMUNITY): Payer: Self-pay

## 2020-03-01 NOTE — Telephone Encounter (Signed)
Pt called me this morning asking to resch our appointment today at 10 to in the morning. My day is already fully booked tomor but I will do my best to get him seen tomor.   Marylouise Stacks, EMT-Paramedic 03/01/20

## 2020-03-06 ENCOUNTER — Other Ambulatory Visit (HOSPITAL_COMMUNITY): Payer: Self-pay | Admitting: *Deleted

## 2020-03-06 ENCOUNTER — Other Ambulatory Visit (HOSPITAL_COMMUNITY): Payer: Self-pay

## 2020-03-06 MED ORDER — BENAZEPRIL HCL 10 MG PO TABS: 20.0000 mg | ORAL_TABLET | Freq: Every day | ORAL | 3 refills | Status: DC

## 2020-03-06 NOTE — Progress Notes (Signed)
Paramedicine Encounter    Patient ID: Daniel Joseph, male    DOB: 1960-10-02, 59 y.o.   MRN: [id number]   Patient Care Team: [name], MD as PCP - General (Internal Medicine) Sueanne Margarita, MD as PCP - Cardiology (Cardiology) Larey Dresser, MD as PCP - Advanced Heart Failure (Cardiology)  Patient Active Problem List   Diagnosis Date Noted  . Neuropathy 02/02/2020  . Nausea 02/02/2020  . Acute exacerbation of CHF (congestive heart failure) ([place]) 08/29/2019  . Alcohol abuse 08/29/2019  . Hypoxic Respiratory failure, acute ([place]) 08/29/2019  . PVC's (premature ventricular contractions)   . HTN (hypertension) 03/02/2014  . Chronic combined systolic and diastolic CHF (congestive heart failure) ([place]) 03/02/2014  . Hypertensive cardiomyopathy ([place]) 03/02/2014  . CKD (chronic kidney disease), stage III 02/15/2014  . Neck nodule 02/01/2014  . Eye redness 02/01/2014  . Mitral valve regurgitation 01/21/2014  . 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  acetaminophen (TYLENOL) 650 MG CR tablet, Take 1,300 mg by mouth every 8 (eight) hours as needed for pain., Disp: , Rfl:  .  allopurinol (ZYLOPRIM) 100 MG tablet, Take 1 tablet (100 mg total) by mouth 2 (two) times daily., Disp: 60 tablet, Rfl: 6 .  amLODipine (NORVASC) 10 MG tablet, Take 1 tablet (10 mg total) by mouth daily with lunch., Disp: 30 tablet, Rfl: 4 .  aspirin EC 81 MG tablet, Take 1 tablet (81 mg total) by mouth daily., Disp: 90 tablet, Rfl: 3 .  benazepril (LOTENSIN) 10 MG tablet, Take 1 tablet (10 mg total) by mouth daily. (Patient taking differently: Take 10 mg by mouth daily. ), Disp: 30 tablet, Rfl: 3 .  bisacodyl (DULCOLAX) 5 MG EC tablet, Take 1 tablet (5 mg total) by mouth daily as needed for moderate constipation., Disp: 4 tablet, Rfl: 0 .  carvedilol (COREG) 12.5 MG tablet, Take 2 tablets (25 mg total) by mouth 2 (two) times daily., Disp: 60 tablet, Rfl: 3 .  cetirizine  (ZYRTEC) 10 MG tablet, TAKE 1 TABLET (10 MG TOTAL) BY MOUTH DAILY., Disp: 30 tablet, Rfl: 11 .  diclofenac Sodium (VOLTAREN) 1 % GEL, Apply 4 g topically 4 (four) times daily as needed (sciatic nerve)., Disp: , Rfl:  .  docusate sodium (COLACE) 100 MG capsule, Take 1 capsule (100 mg total) by mouth daily., Disp: 10 capsule, Rfl: 0 .  Ensure Max Protein (ENSURE MAX PROTEIN) LIQD, Take 330 mLs (11 oz total) by mouth daily., Disp: 330 mL, Rfl: 3 .  furosemide (LASIX) 20 MG tablet, Take 1 tablet (20 mg total) by mouth daily., Disp: 30 tablet, Rfl: 4 .  gabapentin (NEURONTIN) 300 MG capsule, Take 1 capsule (300 mg total) by mouth 3 (three) times daily. (Patient taking differently: Take 300 mg by mouth as needed. ), Disp: 90 capsule, Rfl: 3 .  isosorbide mononitrate (IMDUR) 60 MG 24 hr tablet, Take 1 tablet (60 mg total) by mouth daily., Disp: 90 tablet, Rfl: 3 .  Multiple Vitamin (MULTIVITAMIN WITH MINERALS) TABS tablet, Take 1 tablet by mouth daily., Disp: 30 tablet, Rfl: 0 .  polyethylene glycol powder (GLYCOLAX/MIRALAX) 17 GM/SCOOP powder, Take 255 g by mouth as directed., Disp: 119 g, Rfl: 0 .  potassium chloride SA (K-DUR) 20 MEQ tablet, Take 1 tablet (20 mEq total) by mouth daily., Disp: 90 tablet, Rfl: 3 .  spironolactone (ALDACTONE) 25 MG tablet, Take 1 tablet (25 mg total) by mouth daily., Disp: 30 tablet, Rfl: 3 .  thiamine 100 MG tablet, Take 1 tablet (100 mg total) by mouth daily., Disp: 30 tablet, Rfl: 0 .  tizanidine (ZANAFLEX) 2 MG capsule, Take 2 mg by mouth 3 (three) times daily., Disp: , Rfl:  .  ondansetron (ZOFRAN ODT) 8 MG disintegrating tablet, Take 1 tablet (8 mg total) by mouth every 8 (eight) hours as needed for nausea or vomiting. (Patient not taking: Reported on 02/21/2020), Disp: 20 tablet, Rfl: 0 .  rosuvastatin (CRESTOR) 10 MG tablet, Take 1 tablet (10 mg total) by mouth daily., Disp: 90 tablet, Rfl: 3 No Known Allergies    Social History   Socioeconomic History  .  Marital status: Divorced    Spouse name: Not on file  . Number of children: Not on file  . Years of education: Not on file  . Highest education level: Not on file  Occupational History  . Not on file  Tobacco Use  . Smoking status: Never Smoker  . Smokeless tobacco: Never Used  Vaping Use  . Vaping Use: Never used  Substance and Sexual Activity  . Alcohol use: No  . Drug use: No  . Sexual activity: Yes  Other Topics Concern  . Not on file  Social History Narrative  . Not on file   Social Determinants of Health   Financial Resource Strain:   . Difficulty of Paying Living Expenses:   Food Insecurity:   . Worried About Charity fundraiser in the Last Year:   . Arboriculturist in the Last Year:   Transportation Needs:   . Film/video editor (Medical):   Marland Kitchen Lack of Transportation (Non-Medical):   Physical Activity:   . Days of Exercise per Week:   . Minutes of Exercise per Session:   Stress:   . Feeling of Stress :   Social Connections:   . Frequency of Communication with Friends and Family:   . Frequency of Social Gatherings with Friends and Family:   . Attends Religious Services:   . Active Member of Clubs or Organizations:   . Attends Archivist Meetings:   Marland Kitchen Marital Status:   Intimate Partner Violence:   . Fear of Current or Ex-Partner:   . Emotionally Abused:   Marland Kitchen Physically Abused:   . Sexually Abused:     Physical Exam      Future Appointments  Date Time Provider [place]  03/22/2020 11:00 AM [facility] PHARMACY [facility] None  05/30/2020  9:20 AM Larey Dresser, MD [facility] None    BP (!) 138/92   Pulse 66   Temp (!) 97.2 F (36.2 C)   Resp 18   Wt 220 lb (99.8 kg)   SpO2 98%   BMI 29.03 kg/m   Weight yesterday-220 Last visit weight-220  Pt reports he has been feeling pretty good. At last visit, his b/p was still very high so his benazepril was increased to 20mg  daily per brittiany.  He reports that he has been doing that  since that day.  He did have his meds this morn.  He has been checking his b/p at home and he reports 150s/90s then he reports sometimes his b/p is on the lower side.  He had gout pain a few days ago.  He needs the new dose of the benazepril 20mg  rx sent in to pharmacy.  meds verified and pill box checked.  He denies sob, no c/p, no dizziness, no h/a.  He is also wanting to swap over his meds  to [facility] since they waive medicaid co-pay costs and delivery.   Marylouise Stacks, [facility] Paramedic  03/06/20

## 2020-03-09 ENCOUNTER — Other Ambulatory Visit (HOSPITAL_COMMUNITY): Payer: Self-pay | Admitting: *Deleted

## 2020-03-09 MED ORDER — ROSUVASTATIN CALCIUM 10 MG PO TABS: 10.0000 mg | ORAL_TABLET | Freq: Every day | ORAL | 3 refills | Status: DC

## 2020-03-13 NOTE — Progress Notes (Signed)
PCP: [name], MD Cardiology: Dr. [name] HF Cardiology: Dr. [name]  HPI:  59 y.o. 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 [name] for evaluation of CHF. 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  Cardiolite in 5/16 showed no ischemia but possible apical infarction.  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  PYP scan was read as equivocal in 5/20 but probably not suggestive of transthyretin amyloidosis.   TEE was done in 6/20 to assess severity of MR.  EF was 50% with mild mitral valve prolapse and moderate MR.  RHC/LHC showed nonobstructive CAD, pulmonary venous hypertension, relatively controlled filling pressures. 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   Echo in 3/21 showed EF 55-60%, normal RV, severe LAE, moderate-severe MR.    Patient's BP has been poorly controlled over time.  He had not been consistent with medication compliance.  Renal artery dopplers were unremarkable in 6/16.  Recently seen in clinic 01/28/20 for f/u for CHF and had presyncopal event and found to be orthostatic.  SBP in 70s (was 104/82 when he first arrived).  He was given 500 cc NS in the office with resolution of orthostatic symptoms and improvement in BP.  He had actually ran out of benzepril, doxazosin, and hydralazine.  His compliance with his complicated medication regimen had been quite poor, and it was suspected additional BP meds had been added over time in the setting of him not regularly taking his other BP meds. Hydralazine and doxazosin were both discontinued. Furosemide was reduced to 20 mg daily. Carvedilol , spironolactone, and amlodipine were continued. He is now followed by paramedicine.   He also was ordered to  get a high resolution CT to assess for pulmonary sarcoidosis (ACE level not elevated). This had not yet been completed. Denied by insurance.   Recently presented to HF Clinic with Lyda Jester PA-C on 02/21/20. BP was markedly elevated, 176/122. BP was rechecked after period of rest and remained elevated at 160/118. Reported full medication compliance. Took all of his  AM meds ~1 hr ago. His BP at home had been averaging in the 025K systolic. He denied any further near syncope or dizziness. No CP, dyspnea or HAs. Weight was actually down 5 lbs from previous visit, 225 lbs >> 220 lbs.   Today he returns to HF clinic for pharmacist medication titration. At last visit with PA-C,  Benazepril was increased to 10 mg daily and carvedilol was increased to 25 mg BID. Benazepril was further increased to 20 mg daily on 03/06/20. Overall he is feeling well today. No dizziness or lightheadedness unless he stands in the sun for long periods of time. No chest pains or palpitations. No SOB/DOE. His weight at home has been stable. He takes furosemide 20 mg daily and has not needed any extra. No LEE, PND or orthopnea. His BP in clinic was improved today at 146/78. States his SBPs at home have been running in the 150s. Taking all medications as prescribed with the help of paramedicine. Tolerating all medications.   HF/HTN Medications: Amlodipine 10 mg daily Benazepril 20 mg daily Carvedilol 25 mg BID Spironolactone 25 mg daily Isosorbide mononitrate 60 mg daily Furosemide 20 mg daily Potassium Chloride 20 mEq daily  Has the patient been experiencing any side  effects to the medications prescribed?  no  Does the patient have any problems obtaining medications due to transportation or finances? No - has Medicaid  Understanding of regimen: good Understanding of indications: good Potential of compliance: good Patient understands to avoid NSAIDs. Patient understands to avoid decongestants.    Pertinent Lab  Values (02/28/20): Marland Kitchen Serum creatinine 1.23, BUN 17, Potassium 4.4, Sodium 128  Vital Signs: . Weight: 225.4 lbs (last clinic weight: 220.6 lbs) . Blood pressure: 146/78  . Heart rate: 69   Assessment: 1. HTN: Negative renal artery dopplers and sleep study in past.  On multiple agents. Historically poorly controlled. It is likely medication noncompliance plays a large role.  He is now being followed by paramedicine Joellen Jersey).  -BP elevated today at 146/78, but improved from last visit.  - Continue furosemide 20 mg daily - Increase benazepril to 40 mg daily. Repeat BMET in 2 weeks.   - Continue carvedilol 25 mg BID - Continue spironolactone 25 mg daily - Continue amlodipine 10 mg daily - Continue to hold off on hydralazine, doxazosin and clonidine for now.  - We discussed importance of strict med compliance. He is doing much better with compliance.   3. 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  Echo in 5/20 showed EF 40-45% with normal RV, possible severe mitral regurgitation, and severe pulmonary hypertension.  PYP scan in 5/20 was read as equivocal, but suspected this was not suggestive of transthyretin amyloidosis. TEE in 5/20 showed EF 50%, mild LV dilation, moderate MR.  RHC/LHC showed mildly elevated PCWP, pulmonary venous hypertension, and nonobstructive CAD.  Cardiac MRI in 8/20 showed EF 53% and had an LGE pattern that was suggestive of possible cardiac sarcoidosis (not amyloidosis).  Echo in 3/21 showed EF up to 55-60%.   -On exam today, he was not volume overloaded. Weight was down 5 lb from last visit. Stable NYHA class II symptoms.  - Continue Furosemide 20 mg daily  - Increase benazepril to 40 mg daily as above - Continue carvedilol 25 mg BID - Continue spironolactone 25 mg daily - Given concern for possible cardiac sarcoidosis,  high resolution CT to assess for pulmonary sarcoidosis was recommended, but denied by insurance.   4. Mitral regurgitation: ?severe on 5/20 TTE but TEE in 6/20 showed mild mitral valve prolapse with only moderate MR.  Cardiac MRI also suggested moderate MR. TTE in 3/21 suggested moderate-severe MR.  - Follow for now, may be eventual candidate for MV repair in future.    5. 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  6. 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  - continue Crestor, check lipids today.     Plan: 1) Medication changes: Based on clinical presentation, vital signs and recent labs will increase benazepril to 40 mg daily.  2) Follow-up: repeat BMET in 2 weeks, see Dr. [name] in 2 months.    Audry Riles, PharmD, BCPS, BCCP, CPP Heart Failure Clinic Pharmacist [phone]

## 2020-03-22 ENCOUNTER — Ambulatory Visit (HOSPITAL_COMMUNITY)
Admission: RE | Admit: 2020-03-22 | Discharge: 2020-03-22 | Disposition: A | Discharge status: Home / Self Care | Payer: Medicaid Other | Source: Ambulatory Visit | Attending: Internal Medicine | Admitting: Internal Medicine

## 2020-03-22 ENCOUNTER — Other Ambulatory Visit (HOSPITAL_COMMUNITY): Payer: Self-pay

## 2020-03-22 ENCOUNTER — Other Ambulatory Visit: Payer: Self-pay

## 2020-03-22 VITALS — BP 146/78 | HR 69 | Wt 225.4 lb

## 2020-03-22 DIAGNOSIS — I340 Nonrheumatic mitral (valve) insufficiency: Secondary | ICD-10-CM | POA: Diagnosis not present

## 2020-03-22 DIAGNOSIS — I5042 Chronic combined systolic (congestive) and diastolic (congestive) heart failure: Secondary | ICD-10-CM

## 2020-03-22 DIAGNOSIS — I110 Hypertensive heart disease with heart failure: Secondary | ICD-10-CM | POA: Diagnosis not present

## 2020-03-22 DIAGNOSIS — I428 Other cardiomyopathies: Secondary | ICD-10-CM | POA: Insufficient documentation

## 2020-03-22 DIAGNOSIS — I5032 Chronic diastolic (congestive) heart failure: Secondary | ICD-10-CM | POA: Insufficient documentation

## 2020-03-22 DIAGNOSIS — Z7901 Long term (current) use of anticoagulants: Secondary | ICD-10-CM | POA: Insufficient documentation

## 2020-03-22 DIAGNOSIS — Z79899 Other long term (current) drug therapy: Secondary | ICD-10-CM | POA: Diagnosis not present

## 2020-03-22 DIAGNOSIS — I2510 Atherosclerotic heart disease of native coronary artery without angina pectoris: Secondary | ICD-10-CM | POA: Diagnosis not present

## 2020-03-22 MED ORDER — CARVEDILOL 25 MG PO TABS: 25.0000 mg | ORAL_TABLET | Freq: Two times a day (BID) | ORAL | 3 refills | Status: DC

## 2020-03-22 MED ORDER — BENAZEPRIL HCL 40 MG PO TABS: 40.0000 mg | ORAL_TABLET | Freq: Every day | ORAL | 3 refills | Status: DC

## 2020-03-22 NOTE — Patient Instructions (Addendum)
It was a pleasure seeing you today!  MEDICATIONS: -We are changing your medications today -Increase benazepril to 40 mg (1 tablet) daily -Call if you have questions about your medications.  NEXT APPOINTMENT: Return to clinic in 2 months with Dr. [name].  In general, to take care of your heart failure: -Limit your fluid intake to 2 Liters (half-gallon) per day.   -Limit your salt intake to ideally 2-3 grams (2000-3000 mg) per day. -Weigh yourself daily and record, and bring that "weight diary" to your next appointment.  (Weight gain of 2-3 pounds in 1 day typically means fluid weight.) -The medications for your heart are to help your heart and help you live longer.   -Please contact us before stopping any of your heart medications.  Call the clinic at [phone] with questions or to reschedule future appointments.

## 2020-03-22 NOTE — Progress Notes (Signed)
Paramedicine Encounter   Patient ID: Daniel Joseph , male,   DOB: November 13, 1960,58 y.o.,  MRN: [id number]   Met patient in clinic today with provider.   Weight @ clinic-225 B/p-146/78 p-69 sp02-98  Pt reports he did take his meds this morning.  Tolerating the med changes from last time fine.  He states yesterday he did get dizzy but he was out in the heat but once he got hydrated he felt better.  No fatigue. No c/p, palpitations. No sob. No edema noted.  Weights at home between 219-221.  With his b/p still slightly elevated, his benazepril will be increased to 41m daily.  Labs will be done in 2wks with the increased dose of meds.  Will f/u next week with him.   KMarylouise Stacks [place]7/03/2020

## 2020-03-30 ENCOUNTER — Other Ambulatory Visit (HOSPITAL_COMMUNITY): Payer: Self-pay

## 2020-03-30 NOTE — Progress Notes (Signed)
Paramedicine Encounter    Patient ID: Daniel Joseph, male    DOB: 1961-05-20, 59 y.o.   MRN: [id number]   Patient Care Team: [name], MD as PCP - General (Internal Medicine) Sueanne Margarita, MD as PCP - Cardiology (Cardiology) Larey Dresser, MD as PCP - Advanced Heart Failure (Cardiology)  Patient Active Problem List   Diagnosis Date Noted  . Neuropathy 02/02/2020  . Nausea 02/02/2020  . Acute exacerbation of CHF (congestive heart failure) ([place]) 08/29/2019  . Alcohol abuse 08/29/2019  . Hypoxic Respiratory failure, acute ([place]) 08/29/2019  . PVC's (premature ventricular contractions)   . HTN (hypertension) 03/02/2014  . Chronic combined systolic and diastolic CHF (congestive heart failure) ([place]) 03/02/2014  . Hypertensive cardiomyopathy ([place]) 03/02/2014  . CKD (chronic kidney disease), stage III 02/15/2014  . Neck nodule 02/01/2014  . Eye redness 02/01/2014  . Mitral valve regurgitation 01/21/2014  . 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  acetaminophen (TYLENOL) 650 MG CR tablet, Take 1,300 mg by mouth every 8 (eight) hours as needed for pain., Disp: , Rfl:  .  allopurinol (ZYLOPRIM) 100 MG tablet, Take 1 tablet (100 mg total) by mouth 2 (two) times daily., Disp: 60 tablet, Rfl: 6 .  amLODipine (NORVASC) 10 MG tablet, Take 1 tablet (10 mg total) by mouth daily with lunch., Disp: 30 tablet, Rfl: 4 .  aspirin EC 81 MG tablet, Take 1 tablet (81 mg total) by mouth daily., Disp: 90 tablet, Rfl: 3 .  benazepril (LOTENSIN) 40 MG tablet, Take 1 tablet (40 mg total) by mouth daily., Disp: 90 tablet, Rfl: 3 .  bisacodyl (DULCOLAX) 5 MG EC tablet, Take 1 tablet (5 mg total) by mouth daily as needed for moderate constipation., Disp: 4 tablet, Rfl: 0 .  carvedilol (COREG) 25 MG tablet, Take 1 tablet (25 mg total) by mouth 2 (two) times daily., Disp: 180 tablet, Rfl: 3 .  cetirizine (ZYRTEC) 10 MG tablet, TAKE 1 TABLET (10 MG TOTAL) BY MOUTH  DAILY., Disp: 30 tablet, Rfl: 11 .  diclofenac Sodium (VOLTAREN) 1 % GEL, Apply 4 g topically 4 (four) times daily as needed (sciatic nerve)., Disp: , Rfl:  .  docusate sodium (COLACE) 100 MG capsule, Take 1 capsule (100 mg total) by mouth daily., Disp: 10 capsule, Rfl: 0 .  Ensure Max Protein (ENSURE MAX PROTEIN) LIQD, Take 330 mLs (11 oz total) by mouth daily., Disp: 330 mL, Rfl: 3 .  furosemide (LASIX) 20 MG tablet, Take 1 tablet (20 mg total) by mouth daily., Disp: 30 tablet, Rfl: 4 .  gabapentin (NEURONTIN) 300 MG capsule, Take 1 capsule (300 mg total) by mouth 3 (three) times daily. (Patient taking differently: Take 300 mg by mouth as needed. ), Disp: 90 capsule, Rfl: 3 .  isosorbide mononitrate (IMDUR) 60 MG 24 hr tablet, Take 1 tablet (60 mg total) by mouth daily., Disp: 90 tablet, Rfl: 3 .  Multiple Vitamin (MULTIVITAMIN WITH MINERALS) TABS tablet, Take 1 tablet by mouth daily., Disp: 30 tablet, Rfl: 0 .  polyethylene glycol powder (GLYCOLAX/MIRALAX) 17 GM/SCOOP powder, Take 255 g by mouth as directed., Disp: 119 g, Rfl: 0 .  potassium chloride SA (K-DUR) 20 MEQ tablet, Take 1 tablet (20 mEq total) by mouth daily., Disp: 90 tablet, Rfl: 3 .  rosuvastatin (CRESTOR) 10 MG tablet, Take 1 tablet (10 mg total) by mouth daily., Disp: 90 tablet, Rfl: 3 .  spironolactone (ALDACTONE) 25 MG tablet, Take 1 tablet (25  mg total) by mouth daily., Disp: 30 tablet, Rfl: 3 .  thiamine 100 MG tablet, Take 1 tablet (100 mg total) by mouth daily., Disp: 30 tablet, Rfl: 0 .  tizanidine (ZANAFLEX) 2 MG capsule, Take 2 mg by mouth 3 (three) times daily., Disp: , Rfl:  .  ondansetron (ZOFRAN ODT) 8 MG disintegrating tablet, Take 1 tablet (8 mg total) by mouth every 8 (eight) hours as needed for nausea or vomiting. (Patient not taking: Reported on 03/30/2020), Disp: 20 tablet, Rfl: 0 No Known Allergies    Social History   Socioeconomic History  . Marital status: Divorced    Spouse name: Not on file  . Number  of children: Not on file  . Years of education: Not on file  . Highest education level: Not on file  Occupational History  . Not on file  Tobacco Use  . Smoking status: Never Smoker  . Smokeless tobacco: Never Used  Vaping Use  . Vaping Use: Never used  Substance and Sexual Activity  . Alcohol use: No  . Drug use: No  . Sexual activity: Yes  Other Topics Concern  . Not on file  Social History Narrative  . Not on file   Social Determinants of Health   Financial Resource Strain:   . Difficulty of Paying Living Expenses:   Food Insecurity:   . Worried About Charity fundraiser in the Last Year:   . Arboriculturist in the Last Year:   Transportation Needs:   . Film/video editor (Medical):   Marland Kitchen Lack of Transportation (Non-Medical):   Physical Activity:   . Days of Exercise per Week:   . Minutes of Exercise per Session:   Stress:   . Feeling of Stress :   Social Connections:   . Frequency of Communication with Friends and Family:   . Frequency of Social Gatherings with Friends and Family:   . Attends Religious Services:   . Active Member of Clubs or Organizations:   . Attends Archivist Meetings:   Marland Kitchen Marital Status:   Intimate Partner Violence:   . Fear of Current or Ex-Partner:   . Emotionally Abused:   Marland Kitchen Physically Abused:   . Sexually Abused:     Physical Exam      Future Appointments  Date Time Provider [place]  04/06/2020  9:00 AM [facility] LAB [facility] None  05/30/2020  9:20 AM Larey Dresser, MD [facility] None    BP (!) 142/0   Pulse 68   Resp 18   Wt 220 lb (99.8 kg)   SpO2 99%   BMI 29.03 kg/m   Weight yesterday-220 Last visit weight-225  Pt reports he has been doing well since he was at clinic last week. No issues with the med increase. He just got back from pharmacy to request those refills. He denies sob, no dizziness, no c/p.  B/p still slightly up but he has been doing a lot of running around this morning. I told him  next week when I see him I need him to relax about an hour before our time to get the best b/p possible. He said he would do that.   Marylouise Stacks, [place] [facility] Paramedic  03/30/20

## 2020-04-05 ENCOUNTER — Other Ambulatory Visit (HOSPITAL_COMMUNITY): Payer: Self-pay

## 2020-04-05 NOTE — Progress Notes (Addendum)
Came out for a b/p check. I scheduled for a different time of day to see how his b/p does later in the day. It is still elevated, but still an improvement of previous readings.   Will let Lauren, pharmD know.  He denies any complaints and denies missed doses of meds.  He states home readings are still around 150s/90s  He comes in tomor for labs.  But he always seem to be on the go and just got back from doing some type of work around the house so not sure how well "rested" he gets before our visit regardless of me telling him to chill before I get there.   B/p-152/92 p-72 sp02-98 Temp-97.2   Marylouise Stacks, EMT-paramedic  04/05/20

## 2020-04-06 ENCOUNTER — Other Ambulatory Visit: Payer: Self-pay

## 2020-04-06 ENCOUNTER — Other Ambulatory Visit (HOSPITAL_COMMUNITY): Payer: Self-pay | Admitting: *Deleted

## 2020-04-06 ENCOUNTER — Ambulatory Visit (HOSPITAL_COMMUNITY)
Admission: RE | Admit: 2020-04-06 | Discharge: 2020-04-06 | Disposition: A | Discharge status: Home / Self Care | Payer: Medicaid Other | Source: Ambulatory Visit | Attending: Internal Medicine | Admitting: Internal Medicine

## 2020-04-06 DIAGNOSIS — I5042 Chronic combined systolic (congestive) and diastolic (congestive) heart failure: Secondary | ICD-10-CM | POA: Insufficient documentation

## 2020-04-06 LAB — BASIC METABOLIC PANEL
Anion gap: 10 (ref 5–15)
BUN: 18 mg/dL (ref 6–20)
CO2: 23 mmol/L (ref 22–32)
Calcium: 9.1 mg/dL (ref 8.9–10.3)
Chloride: 106 mmol/L (ref 98–111)
Creatinine, Ser: 1.45 mg/dL — ABNORMAL HIGH (ref 0.61–1.24)
GFR calc Af Amer: >60 mL/min (ref >60)
GFR calc non Af Amer: 53 mL/min — ABNORMAL LOW (ref >60)
Glucose, Bld: 92 mg/dL (ref 70–99)
Potassium: 4.2 mmol/L (ref 3.5–5.1)
Sodium: 139 mmol/L (ref 135–145)

## 2020-04-25 ENCOUNTER — Other Ambulatory Visit (HOSPITAL_COMMUNITY): Payer: Self-pay

## 2020-04-25 NOTE — Progress Notes (Signed)
Paramedicine Encounter    Patient ID: Daniel Joseph, male    DOB: [birth year], 59 y.o.   MRN: [id number]   Patient Care Team: [name], MD as PCP - General (Internal Medicine) Sueanne Margarita, MD as PCP - Cardiology (Cardiology) Larey Dresser, MD as PCP - Advanced Heart Failure (Cardiology)  Patient Active Problem List   Diagnosis Date Noted   Neuropathy 02/02/2020   Nausea 02/02/2020   Acute exacerbation of CHF (congestive heart failure) ([place]) 08/29/2019   Alcohol abuse 08/29/2019   Hypoxic Respiratory failure, acute ([place]) 08/29/2019   PVC's (premature ventricular contractions)    HTN (hypertension) 03/02/2014   Chronic combined systolic and diastolic CHF (congestive heart failure) ([place]) 03/02/2014   Hypertensive cardiomyopathy ([place]) 03/02/2014   CKD (chronic kidney disease), stage III 02/15/2014   Neck nodule 02/01/2014   Eye redness 02/01/2014   Mitral valve regurgitation 01/21/2014   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    Current Outpatient Medications:    acetaminophen (TYLENOL) 650 MG CR tablet, Take 1,300 mg by mouth every 8 (eight) hours as needed for pain., Disp: , Rfl:    allopurinol (ZYLOPRIM) 100 MG tablet, Take 1 tablet (100 mg total) by mouth 2 (two) times daily., Disp: 60 tablet, Rfl: 6   amLODipine (NORVASC) 10 MG tablet, Take 1 tablet (10 mg total) by mouth daily with lunch., Disp: 30 tablet, Rfl: 4   aspirin EC 81 MG tablet, Take 1 tablet (81 mg total) by mouth daily., Disp: 90 tablet, Rfl: 3   benazepril (LOTENSIN) 40 MG tablet, Take 1 tablet (40 mg total) by mouth daily., Disp: 90 tablet, Rfl: 3   bisacodyl (DULCOLAX) 5 MG EC tablet, Take 1 tablet (5 mg total) by mouth daily as needed for moderate constipation., Disp: 4 tablet, Rfl: 0   carvedilol (COREG) 25 MG tablet, Take 1 tablet (25 mg total) by mouth 2 (two) times daily., Disp: 180 tablet, Rfl: 3   cetirizine (ZYRTEC) 10 MG tablet, TAKE 1 TABLET (10 MG TOTAL) BY MOUTH  DAILY., Disp: 30 tablet, Rfl: 11   diclofenac Sodium (VOLTAREN) 1 % GEL, Apply 4 g topically 4 (four) times daily as needed (sciatic nerve)., Disp: , Rfl:    docusate sodium (COLACE) 100 MG capsule, Take 1 capsule (100 mg total) by mouth daily., Disp: 10 capsule, Rfl: 0   Ensure Max Protein (ENSURE MAX PROTEIN) LIQD, Take 330 mLs (11 oz total) by mouth daily., Disp: 330 mL, Rfl: 3   furosemide (LASIX) 20 MG tablet, Take 1 tablet (20 mg total) by mouth daily., Disp: 30 tablet, Rfl: 4   gabapentin (NEURONTIN) 300 MG capsule, Take 1 capsule (300 mg total) by mouth 3 (three) times daily. (Patient taking differently: Take 300 mg by mouth as needed. ), Disp: 90 capsule, Rfl: 3   isosorbide mononitrate (IMDUR) 60 MG 24 hr tablet, Take 1 tablet (60 mg total) by mouth daily., Disp: 90 tablet, Rfl: 3   Multiple Vitamin (MULTIVITAMIN WITH MINERALS) TABS tablet, Take 1 tablet by mouth daily., Disp: 30 tablet, Rfl: 0   polyethylene glycol powder (GLYCOLAX/MIRALAX) 17 GM/SCOOP powder, Take 255 g by mouth as directed., Disp: 119 g, Rfl: 0   potassium chloride SA (K-DUR) 20 MEQ tablet, Take 1 tablet (20 mEq total) by mouth daily., Disp: 90 tablet, Rfl: 3   rosuvastatin (CRESTOR) 10 MG tablet, Take 1 tablet (10 mg total) by mouth daily., Disp: 90 tablet, Rfl: 3   spironolactone (ALDACTONE) 25 MG tablet, Take 1 tablet (25  mg total) by mouth daily., Disp: 30 tablet, Rfl: 3   thiamine 100 MG tablet, Take 1 tablet (100 mg total) by mouth daily., Disp: 30 tablet, Rfl: 0   tizanidine (ZANAFLEX) 2 MG capsule, Take 2 mg by mouth 3 (three) times daily., Disp: , Rfl:    ondansetron (ZOFRAN ODT) 8 MG disintegrating tablet, Take 1 tablet (8 mg total) by mouth every 8 (eight) hours as needed for nausea or vomiting. (Patient not taking: Reported on 03/30/2020), Disp: 20 tablet, Rfl: 0 No Known Allergies    Social History   Socioeconomic History   Marital status: Divorced    Spouse name: Not on file   Number  of children: Not on file   Years of education: Not on file   Highest education level: Not on file  Occupational History   Not on file  Tobacco Use   Smoking status: Never Smoker   Smokeless tobacco: Never Used  Vaping Use   Vaping Use: Never used  Substance and Sexual Activity   Alcohol use: No   Drug use: No   Sexual activity: Yes  Other Topics Concern   Not on file  Social History Narrative   Not on file   Social Determinants of Health   Financial Resource Strain:    Difficulty of Paying Living Expenses:   Food Insecurity:    Worried About Charity fundraiser in the Last Year:    Arboriculturist in the Last Year:   Transportation Needs:    Film/video editor (Medical):    Lack of Transportation (Non-Medical):   Physical Activity:    Days of Exercise per Week:    Minutes of Exercise per Session:   Stress:    Feeling of Stress :   Social Connections:    Frequency of Communication with Friends and Family:    Frequency of Social Gatherings with Friends and Family:    Attends Religious Services:    Active Member of Clubs or Organizations:    Attends Archivist Meetings:    Marital Status:   Intimate Partner Violence:    Fear of Current or Ex-Partner:    Emotionally Abused:    Physically Abused:    Sexually Abused:     Physical Exam      Future Appointments  Date Time Provider [place]  05/30/2020  9:20 AM Larey Dresser, MD [facility] None    BP (!) 170/120    Pulse 66    Temp 98 F (36.7 C)    Resp 18    Wt 219 lb (99.3 kg)    SpO2 98%    BMI 28.89 kg/m   Weight yesterday-219 Last visit weight-220  Pt reports he is doing ok, he states he had gout flare last week.  Pt reports his breathing is doing good. He denies c/p, no dizziness, no h/a. No edema noted.  Appetite is good. Eating and drinking ok. Weight is stable.  He does not have the 40mg  tab of the benazepril yet and he forgot to take 2 tablets of  the ones he has now so he has been taking 20mg .  I called pharmacy to order the 40mg  benazepril and to refill his lasix.  Christy Sartorius said he needed to call his insurance company to see what is going on with the benazepril b/c the insurance was denying it.  His b/p is high, he only took a 20mg  benazepril this morning around 8am, its 930 so I told him  to go ahead and take another 20mg  and starting tomor to take the 40mg  together.   I will f/u with [facility] about his rx.    Marylouise Stacks, [facility] Paramedic  04/25/20

## 2020-05-08 ENCOUNTER — Other Ambulatory Visit (HOSPITAL_COMMUNITY): Payer: Self-pay

## 2020-05-08 ENCOUNTER — Telehealth (HOSPITAL_COMMUNITY): Payer: Self-pay | Admitting: *Deleted

## 2020-05-08 MED ORDER — HYDRALAZINE HCL 25 MG PO TABS
12.5000 mg | ORAL_TABLET | Freq: Three times a day (TID) | ORAL | 3 refills | Status: DC
Start: 2020-05-08 — End: 2020-05-18

## 2020-05-08 NOTE — Progress Notes (Signed)
Paramedicine Encounter    Patient ID: Daniel Joseph, male    DOB: 1961-05-20, 59 y.o.   MRN: [id number]   Patient Care Team: [name], MD as PCP - General (Internal Medicine) Sueanne Margarita, MD as PCP - Cardiology (Cardiology) Larey Dresser, MD as PCP - Advanced Heart Failure (Cardiology)  Patient Active Problem List   Diagnosis Date Noted  . Neuropathy 02/02/2020  . Nausea 02/02/2020  . Acute exacerbation of CHF (congestive heart failure) ([place]) 08/29/2019  . Alcohol abuse 08/29/2019  . Hypoxic Respiratory failure, acute ([place]) 08/29/2019  . PVC's (premature ventricular contractions)   . HTN (hypertension) 03/02/2014  . Chronic combined systolic and diastolic CHF (congestive heart failure) ([place]) 03/02/2014  . Hypertensive cardiomyopathy ([place]) 03/02/2014  . CKD (chronic kidney disease), stage III 02/15/2014  . Neck nodule 02/01/2014  . Eye redness 02/01/2014  . Mitral valve regurgitation 01/21/2014  . 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  acetaminophen (TYLENOL) 650 MG CR tablet, Take 1,300 mg by mouth every 8 (eight) hours as needed for pain., Disp: , Rfl:  .  allopurinol (ZYLOPRIM) 100 MG tablet, Take 1 tablet (100 mg total) by mouth 2 (two) times daily., Disp: 60 tablet, Rfl: 6 .  amLODipine (NORVASC) 10 MG tablet, Take 1 tablet (10 mg total) by mouth daily with lunch., Disp: 30 tablet, Rfl: 4 .  aspirin EC 81 MG tablet, Take 1 tablet (81 mg total) by mouth daily., Disp: 90 tablet, Rfl: 3 .  benazepril (LOTENSIN) 40 MG tablet, Take 1 tablet (40 mg total) by mouth daily., Disp: 90 tablet, Rfl: 3 .  bisacodyl (DULCOLAX) 5 MG EC tablet, Take 1 tablet (5 mg total) by mouth daily as needed for moderate constipation., Disp: 4 tablet, Rfl: 0 .  carvedilol (COREG) 25 MG tablet, Take 1 tablet (25 mg total) by mouth 2 (two) times daily., Disp: 180 tablet, Rfl: 3 .  cetirizine (ZYRTEC) 10 MG tablet, TAKE 1 TABLET (10 MG TOTAL) BY MOUTH  DAILY., Disp: 30 tablet, Rfl: 11 .  diclofenac Sodium (VOLTAREN) 1 % GEL, Apply 4 g topically 4 (four) times daily as needed (sciatic nerve)., Disp: , Rfl:  .  docusate sodium (COLACE) 100 MG capsule, Take 1 capsule (100 mg total) by mouth daily., Disp: 10 capsule, Rfl: 0 .  Ensure Max Protein (ENSURE MAX PROTEIN) LIQD, Take 330 mLs (11 oz total) by mouth daily., Disp: 330 mL, Rfl: 3 .  furosemide (LASIX) 20 MG tablet, Take 1 tablet (20 mg total) by mouth daily., Disp: 30 tablet, Rfl: 4 .  gabapentin (NEURONTIN) 300 MG capsule, Take 1 capsule (300 mg total) by mouth 3 (three) times daily. (Patient taking differently: Take 300 mg by mouth as needed. ), Disp: 90 capsule, Rfl: 3 .  isosorbide mononitrate (IMDUR) 60 MG 24 hr tablet, Take 1 tablet (60 mg total) by mouth daily., Disp: 90 tablet, Rfl: 3 .  Multiple Vitamin (MULTIVITAMIN WITH MINERALS) TABS tablet, Take 1 tablet by mouth daily., Disp: 30 tablet, Rfl: 0 .  polyethylene glycol powder (GLYCOLAX/MIRALAX) 17 GM/SCOOP powder, Take 255 g by mouth as directed., Disp: 119 g, Rfl: 0 .  potassium chloride SA (K-DUR) 20 MEQ tablet, Take 1 tablet (20 mEq total) by mouth daily., Disp: 90 tablet, Rfl: 3 .  rosuvastatin (CRESTOR) 10 MG tablet, Take 1 tablet (10 mg total) by mouth daily., Disp: 90 tablet, Rfl: 3 .  spironolactone (ALDACTONE) 25 MG tablet, Take 1 tablet (25  mg total) by mouth daily., Disp: 30 tablet, Rfl: 3 .  thiamine 100 MG tablet, Take 1 tablet (100 mg total) by mouth daily., Disp: 30 tablet, Rfl: 0 .  tizanidine (ZANAFLEX) 2 MG capsule, Take 2 mg by mouth 3 (three) times daily., Disp: , Rfl:  .  ondansetron (ZOFRAN ODT) 8 MG disintegrating tablet, Take 1 tablet (8 mg total) by mouth every 8 (eight) hours as needed for nausea or vomiting. (Patient not taking: Reported on 03/30/2020), Disp: 20 tablet, Rfl: 0 No Known Allergies    Social History   Socioeconomic History  . Marital status: Divorced    Spouse name: Not on file  . Number  of children: Not on file  . Years of education: Not on file  . Highest education level: Not on file  Occupational History  . Not on file  Tobacco Use  . Smoking status: Never Smoker  . Smokeless tobacco: Never Used  Vaping Use  . Vaping Use: Never used  Substance and Sexual Activity  . Alcohol use: No  . Drug use: No  . Sexual activity: Yes  Other Topics Concern  . Not on file  Social History Narrative  . Not on file   Social Determinants of Health   Financial Resource Strain:   . Difficulty of Paying Living Expenses: Not on file  Food Insecurity:   . Worried About Charity fundraiser in the Last Year: Not on file  . Ran Out of Food in the Last Year: Not on file  Transportation Needs:   . Lack of Transportation (Medical): Not on file  . Lack of Transportation (Non-Medical): Not on file  Physical Activity:   . Days of Exercise per Week: Not on file  . Minutes of Exercise per Session: Not on file  Stress:   . Feeling of Stress : Not on file  Social Connections:   . Frequency of Communication with Friends and Family: Not on file  . Frequency of Social Gatherings with Friends and Family: Not on file  . Attends Religious Services: Not on file  . Active Member of Clubs or Organizations: Not on file  . Attends Archivist Meetings: Not on file  . Marital Status: Not on file  Intimate Partner Violence:   . Fear of Current or Ex-Partner: Not on file  . Emotionally Abused: Not on file  . Physically Abused: Not on file  . Sexually Abused: Not on file    Physical Exam      Future Appointments  Date Time Provider [place]  05/30/2020  9:20 AM Larey Dresser, MD [facility] None    BP (!) 170/120   Pulse 66   Temp (!) 97.2 F (36.2 C)   Resp 16   Wt 220 lb (99.8 kg)   SpO2 99%   BMI 29.03 kg/m   Weight yesterday-219 Last visit weight-219  Pt reports he is doing good. He denies sob, no dizziness, no h/a, no h/a, he reports home b/p have been  140s/90 or so after he started the 40mg  of the benazepril.  Weight stable. No edema noted.  B/p very elevated today. He reports taking meds this morning around 8am.  Repots he ate beef roast for dinner last night.  A few office visits back he was taken off a bunch of cardiac meds b/c of low b/p. Since then his b/p has continued to be high.  Contacted clinic and jasmine will speak to provider and call me back for  any changes.  The best b/p I have gotten in a home visit has been 138/92.  The last visit it was high but he had been forgetting to take the 40mg  of benazepril and was taking 20mg  instead. That was corrected 2 wks ago and the pharmacy brougth out the correct one and for at least 10-12 days he has been taking the 40mg  daily.  Toward end of visit we spoke about fluid intake and he reports he drinks about 2 bottles of Gatorade a day plus water-I advised him to stop drinking the Gatorade as that has a lot of sodium in it and could be causing him to have elevated b/p.  Spoke to jasmine and she spoke to provider and they want to add hydralazine 12.5mg  TID. I offered to get it and bring directly back to him but he had errands to do and wont be back until later this evening. I asked pharmacy to deliver it to him tomor. Pt said last time they left it in mailbox since he wasn't home.   Marylouise Stacks, [place] [facility] Paramedic  05/08/20

## 2020-05-08 NOTE — Telephone Encounter (Signed)
Katie w/paramedicne called to report pts bp this am was 170/120. Pt had taken his morning medications about 1.5 hours before taking bp. Pt said he has not missed any doses of his medications. Per Amy Clegg,NP add hydralazine 12.5mg  three times daily. Katie aware and medication sent to pharmacy.

## 2020-05-17 ENCOUNTER — Telehealth (HOSPITAL_COMMUNITY): Payer: Self-pay

## 2020-05-17 ENCOUNTER — Other Ambulatory Visit (HOSPITAL_COMMUNITY): Payer: Self-pay

## 2020-05-17 DIAGNOSIS — Z8679 Personal history of other diseases of the circulatory system: Secondary | ICD-10-CM

## 2020-05-17 HISTORY — DX: Personal history of other diseases of the circulatory system: Z86.79

## 2020-05-17 NOTE — Telephone Encounter (Signed)
Pt called me back regarding our missed visit for this morning-he had lost his phone in a store and luckily when he went back there, he found it.  He had reported that he has had a dry cough, sometimes the coughing is continuous and it makes him short of breath afterwards.  I asked lauren if the benazepril could be cause of that-it possibly can and she advisedhe should be seen in clinic. I was able to speak to jasmine and get him sch for tomor at 1130 and lauren also advised to continue the medicine for now.  I will relay this info to the pt.   Marylouise Stacks, EMT-Paramedic  05/17/20

## 2020-05-18 ENCOUNTER — Ambulatory Visit (HOSPITAL_COMMUNITY)
Admission: RE | Admit: 2020-05-18 | Discharge: 2020-05-18 | Disposition: A | Discharge status: Home / Self Care | Payer: Medicaid Other | Source: Ambulatory Visit | Attending: Cardiology | Admitting: Cardiology

## 2020-05-18 ENCOUNTER — Other Ambulatory Visit: Payer: Self-pay

## 2020-05-18 ENCOUNTER — Other Ambulatory Visit (HOSPITAL_COMMUNITY): Payer: Self-pay

## 2020-05-18 ENCOUNTER — Encounter (HOSPITAL_COMMUNITY): Payer: Self-pay

## 2020-05-18 VITALS — BP 180/132 | HR 86 | Ht 73.0 in | Wt 223.0 lb

## 2020-05-18 DIAGNOSIS — M109 Gout, unspecified: Secondary | ICD-10-CM | POA: Diagnosis not present

## 2020-05-18 DIAGNOSIS — Z79899 Other long term (current) drug therapy: Secondary | ICD-10-CM | POA: Diagnosis not present

## 2020-05-18 DIAGNOSIS — I10 Essential (primary) hypertension: Secondary | ICD-10-CM | POA: Diagnosis not present

## 2020-05-18 DIAGNOSIS — I5032 Chronic diastolic (congestive) heart failure: Secondary | ICD-10-CM | POA: Diagnosis not present

## 2020-05-18 DIAGNOSIS — Z9114 Patient's other noncompliance with medication regimen: Secondary | ICD-10-CM | POA: Diagnosis not present

## 2020-05-18 DIAGNOSIS — I341 Nonrheumatic mitral (valve) prolapse: Secondary | ICD-10-CM | POA: Insufficient documentation

## 2020-05-18 DIAGNOSIS — Z7982 Long term (current) use of aspirin: Secondary | ICD-10-CM | POA: Diagnosis not present

## 2020-05-18 DIAGNOSIS — Z8249 Family history of ischemic heart disease and other diseases of the circulatory system: Secondary | ICD-10-CM | POA: Insufficient documentation

## 2020-05-18 DIAGNOSIS — I428 Other cardiomyopathies: Secondary | ICD-10-CM | POA: Diagnosis not present

## 2020-05-18 DIAGNOSIS — I5042 Chronic combined systolic (congestive) and diastolic (congestive) heart failure: Secondary | ICD-10-CM | POA: Diagnosis not present

## 2020-05-18 DIAGNOSIS — I110 Hypertensive heart disease with heart failure: Secondary | ICD-10-CM | POA: Diagnosis not present

## 2020-05-18 DIAGNOSIS — I2510 Atherosclerotic heart disease of native coronary artery without angina pectoris: Secondary | ICD-10-CM | POA: Insufficient documentation

## 2020-05-18 LAB — BASIC METABOLIC PANEL
Anion gap: 9 (ref 5–15)
BUN: 16 mg/dL (ref 6–20)
CO2: 23 mmol/L (ref 22–32)
Calcium: 8.9 mg/dL (ref 8.9–10.3)
Chloride: 108 mmol/L (ref 98–111)
Creatinine, Ser: 1.42 mg/dL — ABNORMAL HIGH (ref 0.61–1.24)
GFR calc Af Amer: >60 mL/min (ref >60)
GFR calc non Af Amer: 55 mL/min — ABNORMAL LOW (ref >60)
Glucose, Bld: 87 mg/dL (ref 70–99)
Potassium: 4.1 mmol/L (ref 3.5–5.1)
Sodium: 140 mmol/L (ref 135–145)

## 2020-05-18 LAB — BRAIN NATRIURETIC PEPTIDE: B Natriuretic Peptide: 804.2 pg/mL — ABNORMAL HIGH (ref 0.0–100.0)

## 2020-05-18 MED ORDER — HYDRALAZINE HCL 25 MG PO TABS: 12.5000 mg | ORAL_TABLET | Freq: Three times a day (TID) | ORAL | 3 refills | Status: DC

## 2020-05-18 MED ORDER — LOSARTAN POTASSIUM 100 MG PO TABS: 100.0000 mg | ORAL_TABLET | Freq: Every day | ORAL | 3 refills | Status: DC

## 2020-05-18 NOTE — Progress Notes (Signed)
PCP: [name], MD Cardiology: Dr. [name] HF Cardiology: Dr. [name]  59 y.o. 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 [name] for evaluation of CHF. 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  Cardiolite in 5/16 showed no ischemia but possible apical infarction.  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  PYP scan was read as equivocal in 5/20 but probably not suggestive of transthyretin amyloidosis.   TEE was done in 6/20 to assess severity of MR.  EF was 50% with mild mitral valve prolapse and moderate MR.  RHC/LHC showed nonobstructive CAD, pulmonary venous hypertension, relatively controlled filling pressures. 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   Echo in 3/21 showed EF 55-60%, normal RV, severe LAE, moderate-severe MR.    Patient's BP has been poorly controlled over time.  He has not been consistent with medication compliance.  Renal artery dopplers were unremarkable in 6/16. Recently seen in clinic 5/14 for f/u for CHF and had presyncopal event and found to be orthostatic.  SBP in 70s (was 104/82 when he first arrived).  He was given 500 cc NS in the office with resolution of orthostatic symptoms and improvement in BP.  He had actually ran out of benzepril, doxazosin, and hydralazine.  His compliance with his complicated medication regimen had been quite poor, and it was suspected additional BP meds had been added over time in the setting of him not regularly taking his other BP meds. Hydralazine or doxazosin were both discontinued. Lasix was reduced to 20 mg daily. Coreg, spironolactone, and amlodipine were continued. He is now followed by paramedicine.   He also was ordered to get a high resolution CT to  assess for pulmonary sarcoidosis (ACE level not elevated). This has not yet been completed. Denied by insurance.   At his last several clinic visits, he has been hypertensive and medications have slowly been titrated. He was last seen by PharmD and benazepril was increased to 40 mg daily. He was also restarted on low-dose hydralazine 12.5 mg tid. As noted above, he has been closely followed by paramedicine and reports daily compliance with his medications.  He presents to clinic today for follow-up. His blood pressure remains elevated. Initial BP check was 182/120. Blood pressure was rechecked after several minutes of resting in seated position and blood pressure was still elevated at 180/132. Pulse rate 86 bpm. He denies chest pain. No headache. No dizziness. No dyspnea. Also no lower extremity edema, orthopnea or PND. He denies any excessive sodium intake. Denies alcohol use. No tobacco use. He has been previously evaluated for OSA but had a negative sleep study 04/2019.  His only complaint today is development of a cough after his benazepril was increased. It is a dry cough. Denies fever and no chills.    Labs (5/20): K 3.5, creatinine 1.23 Labs (6/20): Urine immunofixation negative, K 4.2, creatinine 1.36 Labs (7/20): creatinine 1.43 Labs (9/20): uric acid 7.5, LDL 67 Labs (1/21): K 3.9, creatinine 1.33, BNP 388, ACE level low Labs (5/21): K 4.1, creatinine 1.52 Labs (7/21): K4.2, creatinine 1.45  ECG (personally reviewed): not performed   PMH: 1. Gout 2. H/o NSVT/PVCs 3. 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  Seen on multiple echoes, first in 2015.  4. HTN: Renal artery dopplers in  6/16 were unremarkable. Sleep study about 2 years ago was negative per patient's report.  5. Mitral regurgitation: TEE in 2015 with mild to moderate MR.  - Echo (2/20): Moderate MR.  - Echo (5/20): Possible severe MR - TEE (6/20): Mild mitral valve prolapse with moderate MR.  - Echo (3/21):  Moderate to severe MR.  6. 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  - Cardiolite (5/16): Possible apical infarct, no ischemia.  - Echo (7/19): EF 50-55%.  - Echo (2/20): EF 50-55%, moderate MR, TV fibroelastoma.  - PYP scan (5/20): Grade 1 visually, H/CL 1.23.  Read as equivocal but likely negative for TTR amyloidosis.  - Echo (5/20): EF 40-45%, mildly dilated LV with mild LVH, normal RV size and systolic function, possible severe MR, moderate TR, 1 cm nodule on TV may be fibroelastoma, PASP 79 mmHg.  - TEE (6/20): EF 50%, mild LV dilation, mild LVH, moderate LAE, tricuspid valve mass likely fibroelastoma, mild MVP with moderate MR.  - LHC/RHC (6/20): 50% LAD, 30% pRCA; mean RA 4, PA 55/21 mean 34, mean PCWP 17, CI 2.66, PVR 2.8 WU.  - Cardiac MRI (8/20): EF 53%, mild LVH, inferoseptal/inferior mid-wall LGE (?cardiac sarcoidosis, does not look like amyloidosis), mild RV dilation with RVEF 54%, moderate eccentric MR, severe LAE. - Echo (3/21):  EF 55-60%, normal RV, severe LAE, moderate-severe MR.  FH:  Father with MI at 64, brother with MI at 73, mother with CHF, MI.  HTN in multiple family members.   Social History   Socioeconomic History  . Marital status: Divorced    Spouse name: Not on file  . Number of children: Not on file  . Years of education: Not on file  . Highest education level: Not on file  Occupational History  . Not on file  Tobacco Use  . Smoking status: Never Smoker  . Smokeless tobacco: Never Used  Vaping Use  . Vaping Use: Never used  Substance and Sexual Activity  . Alcohol use: No  . Drug use: No  . Sexual activity: Yes  Other Topics Concern  . Not on file  Social History Narrative  . Not on file   Social Determinants of Health   Financial Resource Strain:   . Difficulty of Paying Living Expenses: Not on file  Food Insecurity:   . Worried About Charity fundraiser in the Last Year: Not  on file  . Ran Out of Food in the Last Year: Not on file  Transportation Needs:   . Lack of Transportation (Medical): Not on file  . Lack of Transportation (Non-Medical): Not on file  Physical Activity:   . Days of Exercise per Week: Not on file  . Minutes of Exercise per Session: Not on file  Stress:   . Feeling of Stress : Not on file  Social Connections:   . Frequency of Communication with Friends and Family: Not on file  . Frequency of Social Gatherings with Friends and Family: Not on file  . Attends Religious Services: Not on file  . Active Member of Clubs or Organizations: Not on file  . Attends Archivist Meetings: Not on file  . Marital Status: Not on file  Intimate Partner Violence:   . Fear of Current or Ex-Partner: Not on file  . Emotionally Abused: Not on file  . Physically Abused: Not on file  . Sexually Abused: Not on file   ROS: All systems reviewed  and negative except as per HPI.   Current Outpatient Medications  Medication Sig Dispense Refill  . acetaminophen (TYLENOL) 650 MG CR tablet Take 1,300 mg by mouth every 8 (eight) hours as needed for pain.    Marland Kitchen allopurinol (ZYLOPRIM) 100 MG tablet Take 1 tablet (100 mg total) by mouth 2 (two) times daily. 60 tablet 6  . amLODipine (NORVASC) 10 MG tablet Take 1 tablet (10 mg total) by mouth daily with lunch. 30 tablet 4  . aspirin EC 81 MG tablet Take 1 tablet (81 mg total) by mouth daily. 90 tablet 3  . benazepril (LOTENSIN) 40 MG tablet Take 1 tablet (40 mg total) by mouth daily. 90 tablet 3  . bisacodyl (DULCOLAX) 5 MG EC tablet Take 1 tablet (5 mg total) by mouth daily as needed for moderate constipation. 4 tablet 0  . carvedilol (COREG) 25 MG tablet Take 1 tablet (25 mg total) by mouth 2 (two) times daily. 180 tablet 3  . cetirizine (ZYRTEC) 10 MG tablet TAKE 1 TABLET (10 MG TOTAL) BY MOUTH DAILY. 30 tablet 11  . docusate sodium (COLACE) 100 MG capsule Take 1 capsule (100 mg total) by mouth daily. 10 capsule  0  . Ensure Max Protein (ENSURE MAX PROTEIN) LIQD Take 330 mLs (11 oz total) by mouth daily. 330 mL 3  . furosemide (LASIX) 20 MG tablet Take 1 tablet (20 mg total) by mouth daily. 30 tablet 4  . gabapentin (NEURONTIN) 300 MG capsule Take 1 capsule (300 mg total) by mouth 3 (three) times daily. (Patient taking differently: Take 300 mg by mouth as needed. ) 90 capsule 3  . hydrALAZINE (APRESOLINE) 25 MG tablet Take 0.5 tablets (12.5 mg total) by mouth 3 (three) times daily. 45 tablet 3  . Multiple Vitamin (MULTIVITAMIN WITH MINERALS) TABS tablet Take 1 tablet by mouth daily. 30 tablet 0  . ondansetron (ZOFRAN ODT) 8 MG disintegrating tablet Take 1 tablet (8 mg total) by mouth every 8 (eight) hours as needed for nausea or vomiting. 20 tablet 0  . polyethylene glycol powder (GLYCOLAX/MIRALAX) 17 GM/SCOOP powder Take 255 g by mouth as directed. 119 g 0  . potassium chloride SA (K-DUR) 20 MEQ tablet Take 1 tablet (20 mEq total) by mouth daily. 90 tablet 3  . rosuvastatin (CRESTOR) 10 MG tablet Take 1 tablet (10 mg total) by mouth daily. 90 tablet 3  . spironolactone (ALDACTONE) 25 MG tablet Take 1 tablet (25 mg total) by mouth daily. 30 tablet 3  . thiamine 100 MG tablet Take 1 tablet (100 mg total) by mouth daily. 30 tablet 0  . tizanidine (ZANAFLEX) 2 MG capsule Take 2 mg by mouth 3 (three) times daily.    . diclofenac Sodium (VOLTAREN) 1 % GEL Apply 4 g topically 4 (four) times daily as needed (sciatic nerve). (Patient not taking: Reported on 05/18/2020)    . isosorbide mononitrate (IMDUR) 60 MG 24 hr tablet Take 1 tablet (60 mg total) by mouth daily. 90 tablet 3   No current facility-administered medications for this encounter.   BP (!) 182/120   Pulse 86   Ht 6\' 1"  (1.854 m)   Wt 101.2 kg (223 lb)   SpO2 97%   BMI 29.42 kg/m    PHYSICAL EXAM: General:  Well appearing. No respiratory difficulty HEENT: normal Neck: supple. no JVD. Carotids 2+ bilat; no bruits. No lymphadenopathy or  thyromegaly appreciated. Cor: PMI nondisplaced. Regular rate & rhythm. No rubs, gallops or murmurs. Lungs: clear Abdomen: soft,  nontender, nondistended. No hepatosplenomegaly. No bruits or masses. Good bowel sounds. Extremities: no cyanosis, clubbing, rash, edema Neuro: alert & oriented x 3, cranial nerves grossly intact. moves all 4 extremities w/o difficulty. Affect pleasant.   Assessment/Plan:  1. HTN: Negative renal artery dopplers and sleep study in past.  On multiple agents. Historically poorly controlled, think medication noncompliance plays a large role but he denies this.  He is now being followed by paramedicine. Remains hypertensive, despite reported med compliance. Denies any further hypotension and no orthostatic symptoms. He denies alcohol and tobacco use. -He has developed dry cough which may be related to ACE inhibitor. Will discontinue benazepril -Start losartan 100 mg daily -Increase hydralazine to 25 mg tid -Continue Coreg  25 mg bid -Continue spiro 25 mg -Continue amlodipine 10 mg -Continue Lasix 20 mg daily -Continue to hold off on doxazosin and clonidine for now.  - Paramedicine will continue to follow -Check BMP today -Reiterated importance of strict med compliance and low-sodium diet -Encouraged to increase physical activity 3. 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  Echo in 5/20 showed EF 40-45% with normal RV, possible severe mitral regurgitation, and severe pulmonary hypertension.  PYP scan in 5/20 was read as equivocal, but I suspect this is not suggestive of transthyretin amyloidosis. TEE in 5/20 showed EF 50%, mild LV dilation, moderate MR. RHC/LHC showed mildly elevated PCWP, pulmonary venous hypertension, and nonobstructive CAD.  Cardiac MRI in 8/20 showed EF 53% and had an LGE pattern that was suggestive of possible cardiac sarcoidosis (not amyloidosis).  Echo in 3/21 showed EF up to 55-60%.   - He  is NYHA class II and does not appear grossly volume overloaded on physical exam, however with elevated BP will check BNP to better gauge volume status - Continue lasix 20 mg daily. Will increase if BNP elevated - He needs better BP control, further titrate meds as outlined above - Continue Coreg 25 mg bid  - Continue spironolactone 25 mg daily.  - Given concern for possible cardiac sarcoidosis,  high resolution CT to assess for pulmonary sarcoidosis was recommended but denied by insurance.  4. Mitral regurgitation: ?severe on 5/20 TTE but TEE in 6/20 showed mild mitral valve prolapse with only moderate MR.  Cardiac MRI also suggested moderate MR. TTE in 3/21 suggested moderate-severe MR.  - Currently stable with no significant exertional dyspnea   - Follow for now, may be eventual candidate for MV repair in future. - Repeat echo 11/2020 or sooner if indicated 5. 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  6. 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  - continue Crestor, lipids well controlled on LP 5/21, LDL 57 mg/dL  F/u for repeat BMP in 1 week. W/u w/ pharmD in 4 weeks  Lyda Jester, PA-C  05/18/2020

## 2020-05-18 NOTE — Patient Instructions (Addendum)
STOP Benazepril  INCREASE Hydralazine to 25 mg, one tab three times daily START Losartan 100 mg, one tab daily  Labs today We will only contact you if something comes back abnormal or we need to make some changes. Otherwise no news is good news!  Your physician recommends that you schedule a follow-up appointment in: 3-4 weeks with the pharmacy team  Do the following things EVERYDAY: 1) Weigh yourself in the morning before breakfast. Write it down and keep it in a log. 2) Take your medicines as prescribed 3) Eat low salt foods--Limit salt (sodium) to 2000 mg per day.  4) Stay as active as you can everyday 5) Limit all fluids for the day to less than 2 liters  If you have any questions or concerns before your next appointment please send [place] a message through [place] or call our office at [phone].    TO LEAVE A MESSAGE FOR THE NURSE SELECT OPTION 2, PLEASE LEAVE A MESSAGE INCLUDING: . YOUR NAME . DATE OF BIRTH . CALL BACK NUMBER . REASON FOR CALL**this is important as we prioritize the call backs  YOU WILL RECEIVE A CALL BACK THE SAME DAY AS LONG AS YOU CALL BEFORE 4:00 PM

## 2020-05-18 NOTE — Progress Notes (Signed)
Arrived at pts home for our visit but he was not home.  No answer to phone after multiple times trying and nobody came to door.   Marylouise Stacks, EMT-Paramedic  05/18/20

## 2020-05-18 NOTE — Progress Notes (Signed)
Paramedicine Encounter   Patient ID: Daniel Joseph , male,   DOB: 02-02-1961,58 y.o.,  MRN: [id number]   Met patient in clinic today with provider.   B/p-182/120 B/p-180/132 p-86 sp02-97 Weight @ clinic-223  Pt here for f/u since he began to start having a dry cough since he actually began taking the increased dose.  Pharmacy brought out his hydralazine to him and he reports he has been taking that.  He denies any other complaints.  His hydralazine is being increased to 7m TID.  The benazepril will be stopped and changed to losartan 1011mdaily.    KaMarylouise StacksEMBromley/10/2019

## 2020-05-24 ENCOUNTER — Telehealth (HOSPITAL_COMMUNITY): Payer: Self-pay | Admitting: Cardiology

## 2020-05-24 MED ORDER — POTASSIUM CHLORIDE CRYS ER 20 MEQ PO TBCR
30.0000 meq | EXTENDED_RELEASE_TABLET | Freq: Every day | ORAL | 3 refills | Status: DC
Start: 2020-05-24 — End: 2020-10-27

## 2020-05-24 MED ORDER — FUROSEMIDE 20 MG PO TABS: 40.0000 mg | ORAL_TABLET | Freq: Every day | ORAL | 3 refills | Status: DC

## 2020-05-24 NOTE — Telephone Encounter (Signed)
Pt aware and voiced understanding. Will repeat labs at follow up 9/13

## 2020-05-25 ENCOUNTER — Telehealth (HOSPITAL_COMMUNITY): Payer: Self-pay

## 2020-05-25 NOTE — Telephone Encounter (Signed)
Pt called me to cancel todays appointment and to resch for Monday.  He said he got all his meds and have been taking them and has been feeling fine.  Plan to see him on Monday.  Marylouise Stacks, EMT-Paramedic  05/25/20

## 2020-05-29 ENCOUNTER — Telehealth (HOSPITAL_COMMUNITY): Payer: Self-pay

## 2020-05-29 NOTE — Telephone Encounter (Signed)
Pt called me to state that he had clinic appointment tomor and if we could I just meet him there instead of me coming out today.  He said he has been checking his b/p at home and its still ranging in the 150s/90s despite those med changes that were made recently.  He does report he is getting that hydralazine in 3X a day.  So I will just see him at clinic tomor.   Marylouise Stacks, EMT-Paramedic  05/29/20

## 2020-05-30 ENCOUNTER — Other Ambulatory Visit: Payer: Self-pay

## 2020-05-30 ENCOUNTER — Other Ambulatory Visit (HOSPITAL_COMMUNITY): Payer: Self-pay | Admitting: *Deleted

## 2020-05-30 ENCOUNTER — Other Ambulatory Visit (HOSPITAL_COMMUNITY): Payer: Self-pay

## 2020-05-30 ENCOUNTER — Ambulatory Visit (HOSPITAL_COMMUNITY)
Admission: RE | Admit: 2020-05-30 | Discharge: 2020-05-30 | Disposition: A | Discharge status: Home / Self Care | Payer: Medicaid Other | Source: Ambulatory Visit | Attending: Cardiology | Admitting: Cardiology

## 2020-05-30 ENCOUNTER — Telehealth (HOSPITAL_COMMUNITY): Payer: Self-pay | Admitting: Pharmacy Technician

## 2020-05-30 VITALS — BP 160/105 | HR 80 | Wt 225.0 lb

## 2020-05-30 DIAGNOSIS — M109 Gout, unspecified: Secondary | ICD-10-CM | POA: Insufficient documentation

## 2020-05-30 DIAGNOSIS — I2510 Atherosclerotic heart disease of native coronary artery without angina pectoris: Secondary | ICD-10-CM | POA: Insufficient documentation

## 2020-05-30 DIAGNOSIS — I4892 Unspecified atrial flutter: Secondary | ICD-10-CM | POA: Diagnosis not present

## 2020-05-30 DIAGNOSIS — I428 Other cardiomyopathies: Secondary | ICD-10-CM | POA: Diagnosis not present

## 2020-05-30 DIAGNOSIS — I340 Nonrheumatic mitral (valve) insufficiency: Secondary | ICD-10-CM

## 2020-05-30 DIAGNOSIS — I110 Hypertensive heart disease with heart failure: Secondary | ICD-10-CM | POA: Diagnosis not present

## 2020-05-30 DIAGNOSIS — Z8249 Family history of ischemic heart disease and other diseases of the circulatory system: Secondary | ICD-10-CM | POA: Diagnosis not present

## 2020-05-30 DIAGNOSIS — Z79899 Other long term (current) drug therapy: Secondary | ICD-10-CM | POA: Insufficient documentation

## 2020-05-30 DIAGNOSIS — Z7901 Long term (current) use of anticoagulants: Secondary | ICD-10-CM | POA: Insufficient documentation

## 2020-05-30 DIAGNOSIS — I483 Typical atrial flutter: Secondary | ICD-10-CM | POA: Diagnosis not present

## 2020-05-30 DIAGNOSIS — I5042 Chronic combined systolic (congestive) and diastolic (congestive) heart failure: Secondary | ICD-10-CM

## 2020-05-30 DIAGNOSIS — I5032 Chronic diastolic (congestive) heart failure: Secondary | ICD-10-CM | POA: Diagnosis not present

## 2020-05-30 LAB — BASIC METABOLIC PANEL
Anion gap: 10 (ref 5–15)
BUN: 17 mg/dL (ref 6–20)
CO2: 22 mmol/L (ref 22–32)
Calcium: 9.2 mg/dL (ref 8.9–10.3)
Chloride: 107 mmol/L (ref 98–111)
Creatinine, Ser: 1.36 mg/dL — ABNORMAL HIGH (ref 0.61–1.24)
GFR calc Af Amer: >60 mL/min (ref >60)
GFR calc non Af Amer: 58 mL/min — ABNORMAL LOW (ref >60)
Glucose, Bld: 85 mg/dL (ref 70–99)
Potassium: 4.3 mmol/L (ref 3.5–5.1)
Sodium: 139 mmol/L (ref 135–145)

## 2020-05-30 LAB — CBC
HCT: 44.5 % (ref 39.0–52.0)
Hemoglobin: 14.9 g/dL (ref 13.0–17.0)
MCH: 32.1 pg (ref 26.0–34.0)
MCHC: 33.5 g/dL (ref 30.0–36.0)
MCV: 95.9 fL (ref 80.0–100.0)
Platelets: 88 10*3/uL — ABNORMAL LOW (ref 150–400)
RBC: 4.64 MIL/uL (ref 4.22–5.81)
RDW: 14.1 % (ref 11.5–15.5)
WBC: 3.1 10*3/uL — ABNORMAL LOW (ref 4.0–10.5)
nRBC: 0 % (ref 0.0–0.2)

## 2020-05-30 MED ORDER — ENTRESTO 49-51 MG PO TABS
1.0000 | ORAL_TABLET | Freq: Two times a day (BID) | ORAL | 3 refills | Status: DC
Start: 2020-05-30 — End: 2020-06-12

## 2020-05-30 MED ORDER — APIXABAN 5 MG PO TABS: 5.0000 mg | ORAL_TABLET | Freq: Two times a day (BID) | ORAL | 3 refills | Status: DC

## 2020-05-30 NOTE — Patient Instructions (Addendum)
Stop Aspirin  Stop Losartan  Start Eliquis 5 mg Twice daily   Start Entresto 49/51 mg Twice daily  Labs done today, your results will be available in MyChart, we will contact you for abnormal readings.  Your physician has requested that you have a TEE/Cardioversion. During a TEE, sound waves are used to create images of your heart. It provides your doctor with information about the size and shape of your heart and how well your heart's chambers and valves are working. In this test, a transducer is attached to the end of a flexible tube that is guided down you throat and into your esophagus (the tube leading from your mouth to your stomach) to get a more detailed image of your heart. Once the TEE has determined that a blood clot is not present, the cardioversion begins. Electrical Cardioversion uses a jolt of electricity to your heart either through paddles or wired patches attached to your chest. This is a controlled, usually prescheduled, procedure. This procedure is done at the hospital and you are not awake during the procedure. You usually go home the day of the procedure. Please see the instruction sheet given to you today for more information.  Please follow up with our heart failure pharmacist in 3 weeks  Your physician recommends that you schedule a follow-up appointment in: 3 months  If you have any questions or concerns before your next appointment please send [place] a message through [place] or call our office at [phone].    TO LEAVE A MESSAGE FOR THE NURSE SELECT OPTION 2, PLEASE LEAVE A MESSAGE INCLUDING: . YOUR NAME . DATE OF BIRTH . CALL BACK NUMBER . REASON FOR CALL**this is important as we prioritize the call backs  [place] AS LONG AS YOU CALL BEFORE 4:00 PM  At the [facility], you and your health needs are our priority. As part of our continuing mission to provide you with exceptional heart care, we have created  designated Provider Care Teams. These Care Teams include your primary Cardiologist (physician) and Advanced Practice Providers (APPs- Physician Assistants and Nurse Practitioners) who all work together to provide you with the care you need, when you need it.   You may see any of the following providers on your designated Care Team at your next follow up: Marland Kitchen Dr Glori Bickers . Dr Loralie Champagne . Darrick Grinder, NP . Lyda Jester, PA . Audry Riles, PharmD   Please be sure to bring in all your medications bottles to every appointment.     You are scheduled for a TEE Cardioversion on Thursday 06/08/20 with Dr. [name].  Please arrive at the [facility] (Main Entrance A) at [facility]: [address] [place], [place] [address] at 7:30 am.  DIET: Nothing to eat or drink after midnight except a sip of water with medications (see medication instructions below)  Medication Instructions:  Thursday 9/23 AM  DO NOT TAKE Furosemide  Start taking your anticoagulant: Eliquis 5 mg Twice daily    COVID TEST: Tuesday 9/21 at 10:30 AM at:   [place], [place] must have a responsible person to drive you home and stay in the waiting area during your procedure. Failure to do so could result in cancellation.  Bring your insurance cards.  *Special Note: Every effort is made to have your procedure done on time. Occasionally there are emergencies that occur at the hospital that may cause delays.  Please be patient if a delay does occur.

## 2020-05-30 NOTE — Progress Notes (Signed)
PCP: [name], MD Cardiology: Dr. [name] HF Cardiology: Dr. [name]  59 y.o. 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 [name] for evaluation of CHF. 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  Cardiolite in 5/16 showed no ischemia but possible apical infarction.  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  PYP scan was read as equivocal in 5/20 but probably not suggestive of transthyretin amyloidosis.   TEE was done in 6/20 to assess severity of MR.  EF was 50% with mild mitral valve prolapse and moderate MR.  RHC/LHC showed nonobstructive CAD, pulmonary venous hypertension, relatively controlled filling pressures. 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   Echo in 3/21 showed EF 55-60%, normal RV, severe LAE, moderate-severe MR.    Patient's BP has been poorly controlled over time.  He has not been consistent with medication compliance.  Renal artery dopplers were unremarkable in 6/16.   Patient presents for followup of CHF.  BP remains elevated today.  He says that he has taken all his meds.  BP at home runs 150s/90s.  He is short of breath after walking a few blocks, this is not changed.  No chest pain.  No orthopnea/PND.  Weight is stable.  He does not feel palpitations, but he is in rate-controlled atrial flutter today.  No lightheadedness.   Labs (5/20): K 3.5, creatinine 1.23 Labs (6/20): Urine immunofixation negative, K 4.2, creatinine 1.36 Labs (7/20): creatinine 1.43 Labs (9/20): uric acid 7.5, LDL 67 Labs (1/21): K 3.9, creatinine 1.33, BNP 388, ACE level low Labs (5/21): LDL 57 Labs (9/21): BNP 804, K 4.1, creatinine 1.42  ECG (personally reviewed): Atrial flutter rate 71  PMH: 1. Gout 2. H/o  NSVT/PVCs 3. 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  Seen on multiple echoes, first in 2015.  4. HTN: Renal artery dopplers in 6/16 were unremarkable. Sleep study about 2 years ago was negative per patient's report.  5. Mitral regurgitation: TEE in 2015 with mild to moderate MR.  - Echo (2/20): Moderate MR.  - Echo (5/20): Possible severe MR - TEE (6/20): Mild mitral valve prolapse with moderate MR.  - Echo (3/21): Moderate to severe MR.  6. 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  - Cardiolite (5/16): Possible apical infarct, no ischemia.  - Echo (7/19): EF 50-55%.  - Echo (2/20): EF 50-55%, moderate MR, TV fibroelastoma.  - PYP scan (5/20): Grade 1 visually, H/CL 1.23.  Read as equivocal but likely negative for TTR amyloidosis.  - Echo (5/20): EF 40-45%, mildly dilated LV with mild LVH, normal RV size and systolic function, possible severe MR, moderate TR, 1 cm nodule on TV may be fibroelastoma, PASP 79 mmHg.  - TEE (6/20): EF 50%, mild LV dilation, mild LVH, moderate LAE, tricuspid valve mass likely fibroelastoma, mild MVP with moderate MR.  - LHC/RHC (6/20): 50% LAD, 30% pRCA; mean RA 4, PA 55/21 mean 34, mean PCWP 17, CI 2.66, PVR 2.8 WU.  - Cardiac MRI (8/20): EF 53%, mild LVH, inferoseptal/inferior mid-wall LGE (?cardiac sarcoidosis, does not look like amyloidosis), mild RV dilation with RVEF 54%, moderate eccentric MR, severe LAE. - Echo (3/21):  EF 55-60%, normal RV, severe LAE, moderate-severe MR. 7. Atrial flutter: Noted first  in 9/21.   FH:  Father with MI at 1, brother with MI at 71, mother with CHF, MI.  HTN in multiple family members.   Social History   Socioeconomic History  . Marital status: Divorced    Spouse name: Not on file  . Number of children: Not on file  . Years of education: Not on file  . Highest education level: Not on file  Occupational History  . Not on file  Tobacco Use  .  Smoking status: Never Smoker  . Smokeless tobacco: Never Used  Vaping Use  . Vaping Use: Never used  Substance and Sexual Activity  . Alcohol use: No  . Drug use: No  . Sexual activity: Yes  Other Topics Concern  . Not on file  Social History Narrative  . Not on file   Social Determinants of Health   Financial Resource Strain:   . Difficulty of Paying Living Expenses: Not on file  Food Insecurity:   . Worried About Charity fundraiser in the Last Year: Not on file  . Ran Out of Food in the Last Year: Not on file  Transportation Needs:   . Lack of Transportation (Medical): Not on file  . Lack of Transportation (Non-Medical): Not on file  Physical Activity:   . Days of Exercise per Week: Not on file  . Minutes of Exercise per Session: Not on file  Stress:   . Feeling of Stress : Not on file  Social Connections:   . Frequency of Communication with Friends and Family: Not on file  . Frequency of Social Gatherings with Friends and Family: Not on file  . Attends Religious Services: Not on file  . Active Member of Clubs or Organizations: Not on file  . Attends Archivist Meetings: Not on file  . Marital Status: Not on file  Intimate Partner Violence:   . Fear of Current or Ex-Partner: Not on file  . Emotionally Abused: Not on file  . Physically Abused: Not on file  . Sexually Abused: Not on file   ROS: All systems reviewed and negative except as per HPI.   Current Outpatient Medications  Medication Sig Dispense Refill  . acetaminophen (TYLENOL) 650 MG CR tablet Take 1,300 mg by mouth every 8 (eight) hours as needed for pain.    Marland Kitchen allopurinol (ZYLOPRIM) 100 MG tablet Take 1 tablet (100 mg total) by mouth 2 (two) times daily. 60 tablet 6  . amLODipine (NORVASC) 10 MG tablet Take 1 tablet (10 mg total) by mouth daily with lunch. 30 tablet 4  . bisacodyl (DULCOLAX) 5 MG EC tablet Take 1 tablet (5 mg total) by mouth daily as needed for moderate constipation. 4 tablet 0   . carvedilol (COREG) 25 MG tablet Take 1 tablet (25 mg total) by mouth 2 (two) times daily. 180 tablet 3  . cetirizine (ZYRTEC) 10 MG tablet TAKE 1 TABLET (10 MG TOTAL) BY MOUTH DAILY. 30 tablet 11  . docusate sodium (COLACE) 100 MG capsule Take 1 capsule (100 mg total) by mouth daily. 10 capsule 0  . Ensure Max Protein (ENSURE MAX PROTEIN) LIQD Take 330 mLs (11 oz total) by mouth daily. 330 mL 3  . furosemide (LASIX) 20 MG tablet Take 2 tablets (40 mg total) by mouth daily. 60 tablet 3  . gabapentin (NEURONTIN) 300 MG capsule Take 300 mg by mouth as needed.    . hydrALAZINE (APRESOLINE) 25 MG tablet Take 0.5 tablets (12.5 mg total) by  mouth 3 (three) times daily. 90 tablet 3  . hydrALAZINE (APRESOLINE) 25 MG tablet Take 25 mg by mouth 3 (three) times daily.    . isosorbide mononitrate (IMDUR) 60 MG 24 hr tablet Take 60 mg by mouth daily.    . Multiple Vitamin (MULTIVITAMIN WITH MINERALS) TABS tablet Take 1 tablet by mouth daily. 30 tablet 0  . ondansetron (ZOFRAN ODT) 8 MG disintegrating tablet Take 1 tablet (8 mg total) by mouth every 8 (eight) hours as needed for nausea or vomiting. 20 tablet 0  . polyethylene glycol powder (GLYCOLAX/MIRALAX) 17 GM/SCOOP powder Take 255 g by mouth as directed. 119 g 0  . potassium chloride SA (KLOR-CON) 20 MEQ tablet Take 1.5 tablets (30 mEq total) by mouth daily. 45 tablet 3  . rosuvastatin (CRESTOR) 10 MG tablet Take 1 tablet (10 mg total) by mouth daily. 90 tablet 3  . spironolactone (ALDACTONE) 25 MG tablet Take 1 tablet (25 mg total) by mouth daily. 30 tablet 3  . thiamine 100 MG tablet Take 1 tablet (100 mg total) by mouth daily. 30 tablet 0  . tizanidine (ZANAFLEX) 2 MG capsule Take 2 mg by mouth 3 (three) times daily.    Marland Kitchen apixaban (ELIQUIS) 5 MG TABS tablet Take 1 tablet (5 mg total) by mouth 2 (two) times daily. 60 tablet 3  . sacubitril-valsartan (ENTRESTO) 49-51 MG Take 1 tablet by mouth 2 (two) times daily. 60 tablet 3   No current  facility-administered medications for this encounter.   BP (!) 160/105   Pulse 80   Wt 102.1 kg (225 lb)   SpO2 98%   BMI 29.69 kg/m  General: NAD Neck: No JVD, no thyromegaly or thyroid nodule.  Lungs: Clear to auscultation bilaterally with normal respiratory effort. CV: Nondisplaced PMI.  Heart irregular S1/S2, no S3/S4, 3/6 HSM apex.  No peripheral edema.  No carotid bruit.  Normal pedal pulses.  Abdomen: Soft, nontender, no hepatosplenomegaly, no distention.  Skin: Intact without lesions or rashes.  Neurologic: Alert and oriented x 3.  Psych: Normal affect. Extremities: No clubbing or cyanosis.  HEENT: Normal.   Assessment/Plan: 1. HTN: Negative renal artery dopplers and sleep study in past.  On multiple agents. Historically poorly controlled, think medication noncompliance plays a large role.   - Given benefits extending to diastolic CHF, will have him stop losartan and start Entresto 49/51 bid. BMET today and in 10 days.   - Continue amlodipine 10 mg daily.  - Continue hydralazine 25 mg tid and Imdur 60.  - Continue spironolactone 25 mg daily.  - Continue Coreg 25 mg bid.  2. 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  Echo in 5/20 showed EF 40-45% with normal RV, possible severe mitral regurgitation, and severe pulmonary hypertension.  PYP scan in 5/20 was read as equivocal, but I suspect this is not suggestive of transthyretin amyloidosis. TEE in 5/20 showed EF 50%, mild LV dilation, moderate MR.  RHC/LHC showed mildly elevated PCWP, pulmonary venous hypertension, and nonobstructive CAD.  Cardiac MRI in 8/20 showed EF 53% and had an LGE pattern that was suggestive of possible cardiac sarcoidosis (not amyloidosis).  Echo in 3/21 showed EF up to 55-60%.  On exam today, he is not volume overloaded.  NYHA class II symptoms.  - Continue Lasix 40 mg daily with BMET today.  - Transitioning losartan to Entresto.  - Continue Coreg  12.5 mg bid and spironolactone 25 mg daily.  - Given concern for possible cardiac sarcoidosis,  I will arrange for high resolution CT to assess for pulmonary sarcoidosis (not done yet but ordered).  If this is unremarkable, will see if we can get a cardiac PET done at [facility] to assess for cardiac sarcoidosis.  3. Mitral regurgitation: ?severe on 5/20 TTE but TEE in 6/20 showed mild mitral valve prolapse with only moderate MR.  Cardiac MRI also suggested moderate MR. TTE in 3/21 suggested moderate-severe MR. He has a prominent murmur on exam.  - May be eventual candidate for MV repair.  - I will repeat TEE to assess mitral valve regurgitation and also pre-DCCV.  We discussed risks/benefits of procedure and he agrees.    4. 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  5. 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  - continue Crestor, good lipids in 5/21.  6. Atrial flutter: Rate-controlled.  New diagnosis, unsure how long it has been present.   - Start Eliquis 5 mg bid.  - I will arrange for TEE-guided DCCV next week as above.  - Consider atrial flutter ablation if flutter returns after DCCV.   Followup in HF pharmacist in 3 wks for BP medication titration, see me in 3 months.   Loralie Champagne 05/30/2020

## 2020-05-30 NOTE — Telephone Encounter (Signed)
Patient Advocate Encounter   Received notification from [facility] that prior authorization for Daniel Joseph is required.   PA submitted on CoverMyMeds Key  [id number] Status is pending   Will continue to follow.

## 2020-05-30 NOTE — Telephone Encounter (Signed)
Patient was started on Eliquis as well. Of note, there is no PA required, 30 day co-pay is $3.00.

## 2020-05-30 NOTE — Progress Notes (Signed)
Paramedicine Encounter   Patient ID: Daniel Joseph , male,   DOB: 1961-01-25,58 y.o.,  MRN: [id number]  Met patient in clinic today with provider.  Weight @ clinic-225  B/p-160/105 p-80 sp02-98  Pt forgot to bring his meds with him today.  I have not been able to see him since the last clinic visit as he has cancelled on me twice and wanted me to see him here today. So I cannot confirm or deny what he is doing at home.  He states he has done the increased dose of lasix and potassium.  He reports not much changes with the increased dose of lasix.  He states he is getting all 3 doses of his hydralazine.  That cough has improved since d/c of benazepril.  He reports his home b/p checks have been running around 150/90s.  Switching from losartan to entresto to 49/51 BID.  He still has a leaky valve and lour murmur and Daniel Joseph is concerned it is causing him short of breath and increased BNP.  He gets sob when walking too far.  EKG today.  He is in A-Flutter.  He said yesterday he felt his heart beating rapidly and a few times since last week.  He is going to be taken off asa and put on eliquis.  TEE next week and also he will be getting a cardioversion at same time.  Will check to see what pharmacy has on file for him for insurance. I will see him on Thursday morning for f/u and to ensure what meds he has.  He will have the TEE next Thursday. He will need a covid test 2 days prior to procedure.   Med changes---starting on eliquis and entresto, stop losartan and asa.   Prior to procedure--no lasix the morning of the [place], [place] 05/30/2020

## 2020-05-30 NOTE — Telephone Encounter (Signed)
Advanced Heart Failure Patient Advocate Encounter  Prior Authorization for Daniel Joseph has been approved.    PA# [id number] Effective dates: 05/30/20 through 05/30/21  Patients co-pay is $3.00  Charlann Boxer, CPhT

## 2020-05-30 NOTE — H&P (View-Only) (Signed)
PCP: [name], MD Cardiology: Dr. [name] HF Cardiology: Dr. [name]  59 y.o. 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 [name] for evaluation of CHF. 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  Cardiolite in 5/16 showed no ischemia but possible apical infarction.  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  PYP scan was read as equivocal in 5/20 but probably not suggestive of transthyretin amyloidosis.   TEE was done in 6/20 to assess severity of MR.  EF was 50% with mild mitral valve prolapse and moderate MR.  RHC/LHC showed nonobstructive CAD, pulmonary venous hypertension, relatively controlled filling pressures. 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   Echo in 3/21 showed EF 55-60%, normal RV, severe LAE, moderate-severe MR.    Patient's BP has been poorly controlled over time.  He has not been consistent with medication compliance.  Renal artery dopplers were unremarkable in 6/16.   Patient presents for followup of CHF.  BP remains elevated today.  He says that he has taken all his meds.  BP at home runs 150s/90s.  He is short of breath after walking a few blocks, this is not changed.  No chest pain.  No orthopnea/PND.  Weight is stable.  He does not feel palpitations, but he is in rate-controlled atrial flutter today.  No lightheadedness.   Labs (5/20): K 3.5, creatinine 1.23 Labs (6/20): Urine immunofixation negative, K 4.2, creatinine 1.36 Labs (7/20): creatinine 1.43 Labs (9/20): uric acid 7.5, LDL 67 Labs (1/21): K 3.9, creatinine 1.33, BNP 388, ACE level low Labs (5/21): LDL 57 Labs (9/21): BNP 804, K 4.1, creatinine 1.42  ECG (personally reviewed): Atrial flutter rate 71  PMH: 1. Gout 2. H/o  NSVT/PVCs 3. 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  Seen on multiple echoes, first in 2015.  4. HTN: Renal artery dopplers in 6/16 were unremarkable. Sleep study about 2 years ago was negative per patient's report.  5. Mitral regurgitation: TEE in 2015 with mild to moderate MR.  - Echo (2/20): Moderate MR.  - Echo (5/20): Possible severe MR - TEE (6/20): Mild mitral valve prolapse with moderate MR.  - Echo (3/21): Moderate to severe MR.  6. 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  - Cardiolite (5/16): Possible apical infarct, no ischemia.  - Echo (7/19): EF 50-55%.  - Echo (2/20): EF 50-55%, moderate MR, TV fibroelastoma.  - PYP scan (5/20): Grade 1 visually, H/CL 1.23.  Read as equivocal but likely negative for TTR amyloidosis.  - Echo (5/20): EF 40-45%, mildly dilated LV with mild LVH, normal RV size and systolic function, possible severe MR, moderate TR, 1 cm nodule on TV may be fibroelastoma, PASP 79 mmHg.  - TEE (6/20): EF 50%, mild LV dilation, mild LVH, moderate LAE, tricuspid valve mass likely fibroelastoma, mild MVP with moderate MR.  - LHC/RHC (6/20): 50% LAD, 30% pRCA; mean RA 4, PA 55/21 mean 34, mean PCWP 17, CI 2.66, PVR 2.8 WU.  - Cardiac MRI (8/20): EF 53%, mild LVH, inferoseptal/inferior mid-wall LGE (?cardiac sarcoidosis, does not look like amyloidosis), mild RV dilation with RVEF 54%, moderate eccentric MR, severe LAE. - Echo (3/21):  EF 55-60%, normal RV, severe LAE, moderate-severe MR. 7. Atrial flutter: Noted first  in 9/21.   FH:  Father with MI at 34, brother with MI at 54, mother with CHF, MI.  HTN in multiple family members.   Social History   Socioeconomic History   Marital status: Divorced    Spouse name: Not on file   Number of children: Not on file   Years of education: Not on file   Highest education level: Not on file  Occupational History   Not on file  Tobacco Use    Smoking status: Never Smoker   Smokeless tobacco: Never Used  Vaping Use   Vaping Use: Never used  Substance and Sexual Activity   Alcohol use: No   Drug use: No   Sexual activity: Yes  Other Topics Concern   Not on file  Social History Narrative   Not on file   Social Determinants of Health   Financial Resource Strain:    Difficulty of Paying Living Expenses: Not on file  Food Insecurity:    Worried About [place] in the Last Year: Not on file   Ran Out of Food in the Last Year: Not on file  Transportation Needs:    Lack of Transportation (Medical): Not on file   Lack of Transportation (Non-Medical): Not on file  Physical Activity:    Days of Exercise per Week: Not on file   Minutes of Exercise per Session: Not on file  Stress:    Feeling of Stress : Not on file  Social Connections:    Frequency of Communication with Friends and Family: Not on file   Frequency of Social Gatherings with Friends and Family: Not on file   Attends Religious Services: Not on file   Active Member of Clubs or Organizations: Not on file   Attends Archivist Meetings: Not on file   Marital Status: Not on file  Intimate Partner Violence:    Fear of Current or Ex-Partner: Not on file   Emotionally Abused: Not on file   Physically Abused: Not on file   Sexually Abused: Not on file   ROS: All systems reviewed and negative except as per HPI.   Current Outpatient Medications  Medication Sig Dispense Refill   acetaminophen (TYLENOL) 650 MG CR tablet Take 1,300 mg by mouth every 8 (eight) hours as needed for pain.     allopurinol (ZYLOPRIM) 100 MG tablet Take 1 tablet (100 mg total) by mouth 2 (two) times daily. 60 tablet 6   amLODipine (NORVASC) 10 MG tablet Take 1 tablet (10 mg total) by mouth daily with lunch. 30 tablet 4   bisacodyl (DULCOLAX) 5 MG EC tablet Take 1 tablet (5 mg total) by mouth daily as needed for moderate constipation. 4 tablet 0    carvedilol (COREG) 25 MG tablet Take 1 tablet (25 mg total) by mouth 2 (two) times daily. 180 tablet 3   cetirizine (ZYRTEC) 10 MG tablet TAKE 1 TABLET (10 MG TOTAL) BY MOUTH DAILY. 30 tablet 11   docusate sodium (COLACE) 100 MG capsule Take 1 capsule (100 mg total) by mouth daily. 10 capsule 0   Ensure Max Protein (ENSURE MAX PROTEIN) LIQD Take 330 mLs (11 oz total) by mouth daily. 330 mL 3   furosemide (LASIX) 20 MG tablet Take 2 tablets (40 mg total) by mouth daily. 60 tablet 3   gabapentin (NEURONTIN) 300 MG capsule Take 300 mg by mouth as needed.     hydrALAZINE (APRESOLINE) 25 MG tablet Take 0.5 tablets (12.5 mg total) by  mouth 3 (three) times daily. 90 tablet 3   hydrALAZINE (APRESOLINE) 25 MG tablet Take 25 mg by mouth 3 (three) times daily.     isosorbide mononitrate (IMDUR) 60 MG 24 hr tablet Take 60 mg by mouth daily.     Multiple Vitamin (MULTIVITAMIN WITH MINERALS) TABS tablet Take 1 tablet by mouth daily. 30 tablet 0   ondansetron (ZOFRAN ODT) 8 MG disintegrating tablet Take 1 tablet (8 mg total) by mouth every 8 (eight) hours as needed for nausea or vomiting. 20 tablet 0   polyethylene glycol powder (GLYCOLAX/MIRALAX) 17 GM/SCOOP powder Take 255 g by mouth as directed. 119 g 0   potassium chloride SA (KLOR-CON) 20 MEQ tablet Take 1.5 tablets (30 mEq total) by mouth daily. 45 tablet 3   rosuvastatin (CRESTOR) 10 MG tablet Take 1 tablet (10 mg total) by mouth daily. 90 tablet 3   spironolactone (ALDACTONE) 25 MG tablet Take 1 tablet (25 mg total) by mouth daily. 30 tablet 3   thiamine 100 MG tablet Take 1 tablet (100 mg total) by mouth daily. 30 tablet 0   tizanidine (ZANAFLEX) 2 MG capsule Take 2 mg by mouth 3 (three) times daily.     apixaban (ELIQUIS) 5 MG TABS tablet Take 1 tablet (5 mg total) by mouth 2 (two) times daily. 60 tablet 3   sacubitril-valsartan (ENTRESTO) 49-51 MG Take 1 tablet by mouth 2 (two) times daily. 60 tablet 3   No current  facility-administered medications for this encounter.   BP (!) 160/105    Pulse 80    Wt 102.1 kg (225 lb)    SpO2 98%    BMI 29.69 kg/m  General: NAD Neck: No JVD, no thyromegaly or thyroid nodule.  Lungs: Clear to auscultation bilaterally with normal respiratory effort. CV: Nondisplaced PMI.  Heart irregular S1/S2, no S3/S4, 3/6 HSM apex.  No peripheral edema.  No carotid bruit.  Normal pedal pulses.  Abdomen: Soft, nontender, no hepatosplenomegaly, no distention.  Skin: Intact without lesions or rashes.  Neurologic: Alert and oriented x 3.  Psych: Normal affect. Extremities: No clubbing or cyanosis.  HEENT: Normal.   Assessment/Plan: 1. HTN: Negative renal artery dopplers and sleep study in past.  On multiple agents. Historically poorly controlled, think medication noncompliance plays a large role.   - Given benefits extending to diastolic CHF, will have him stop losartan and start Entresto 49/51 bid. BMET today and in 10 days.   - Continue amlodipine 10 mg daily.  - Continue hydralazine 25 mg tid and Imdur 60.  - Continue spironolactone 25 mg daily.  - Continue Coreg 25 mg bid.  2. 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  Echo in 5/20 showed EF 40-45% with normal RV, possible severe mitral regurgitation, and severe pulmonary hypertension.  PYP scan in 5/20 was read as equivocal, but I suspect this is not suggestive of transthyretin amyloidosis. TEE in 5/20 showed EF 50%, mild LV dilation, moderate MR.  RHC/LHC showed mildly elevated PCWP, pulmonary venous hypertension, and nonobstructive CAD.  Cardiac MRI in 8/20 showed EF 53% and had an LGE pattern that was suggestive of possible cardiac sarcoidosis (not amyloidosis).  Echo in 3/21 showed EF up to 55-60%.  On exam today, he is not volume overloaded.  NYHA class II symptoms.  - Continue Lasix 40 mg daily with BMET today.  - Transitioning losartan to Entresto.  - Continue Coreg  12.5 mg bid and spironolactone 25 mg daily.  - Given concern  for possible cardiac sarcoidosis, I will arrange for high resolution CT to assess for pulmonary sarcoidosis (not done yet but ordered).  If this is unremarkable, will see if we can get a cardiac PET done at [facility] to assess for cardiac sarcoidosis.  3. Mitral regurgitation: ?severe on 5/20 TTE but TEE in 6/20 showed mild mitral valve prolapse with only moderate MR.  Cardiac MRI also suggested moderate MR. TTE in 3/21 suggested moderate-severe MR. He has a prominent murmur on exam.  - May be eventual candidate for MV repair.  - I will repeat TEE to assess mitral valve regurgitation and also pre-DCCV.  We discussed risks/benefits of procedure and he agrees.    4. 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  5. 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  - continue Crestor, good lipids in 5/21.  6. Atrial flutter: Rate-controlled.  New diagnosis, unsure how long it has been present.   - Start Eliquis 5 mg bid.  - I will arrange for TEE-guided DCCV next week as above.  - Consider atrial flutter ablation if flutter returns after DCCV.   Followup in HF pharmacist in 3 wks for BP medication titration, see me in 3 months.   Loralie Champagne 05/30/2020

## 2020-06-01 ENCOUNTER — Other Ambulatory Visit (HOSPITAL_COMMUNITY): Payer: Self-pay | Admitting: *Deleted

## 2020-06-01 ENCOUNTER — Other Ambulatory Visit (HOSPITAL_COMMUNITY): Payer: Self-pay

## 2020-06-01 ENCOUNTER — Telehealth (HOSPITAL_COMMUNITY): Payer: Self-pay

## 2020-06-01 DIAGNOSIS — I10 Essential (primary) hypertension: Secondary | ICD-10-CM

## 2020-06-01 DIAGNOSIS — D691 Qualitative platelet defects: Secondary | ICD-10-CM

## 2020-06-01 MED ORDER — AMLODIPINE BESYLATE 10 MG PO TABS: 10.0000 mg | ORAL_TABLET | Freq: Every day | ORAL | 4 refills | Status: DC

## 2020-06-01 MED ORDER — HYDRALAZINE HCL 25 MG PO TABS
25.0000 mg | ORAL_TABLET | Freq: Three times a day (TID) | ORAL | 3 refills | Status: DC
Start: 2020-06-01 — End: 2020-06-19

## 2020-06-01 NOTE — Telephone Encounter (Signed)
-----   Message from Larey Dresser, MD sent at 05/30/2020 10:19 PM EDT ----- Low platelets chronically.  He needs a referral to hematology to evaluate this.

## 2020-06-01 NOTE — Telephone Encounter (Signed)
Patient advised and verbalized understanding. Patient is willing to see hematology. Referral placed.   Orders Placed This Encounter  Procedures  . Ambulatory referral to Hematology    Referral Priority:   Routine    Referral Type:   Consultation    Referral Reason:   Specialty Services Required    Requested Specialty:   Oncology    Number of Visits Requested:   1

## 2020-06-01 NOTE — Progress Notes (Signed)
Paramedicine Encounter    Patient ID: Daniel Joseph, male    DOB: 1961/03/22, 59 y.o.   MRN: [id number]   Patient Care Team: [name], MD as PCP - General (Internal Medicine) Sueanne Margarita, MD as PCP - Cardiology (Cardiology) Larey Dresser, MD as PCP - Advanced Heart Failure (Cardiology)  Patient Active Problem List   Diagnosis Date Noted  . Neuropathy 02/02/2020  . Nausea 02/02/2020  . Acute exacerbation of CHF (congestive heart failure) ([place]) 08/29/2019  . Alcohol abuse 08/29/2019  . Hypoxic Respiratory failure, acute ([place]) 08/29/2019  . PVC's (premature ventricular contractions)   . HTN (hypertension) 03/02/2014  . Chronic combined systolic and diastolic CHF (congestive heart failure) ([place]) 03/02/2014  . Hypertensive cardiomyopathy ([place]) 03/02/2014  . CKD (chronic kidney disease), stage III 02/15/2014  . Neck nodule 02/01/2014  . Eye redness 02/01/2014  . Mitral valve regurgitation 01/21/2014  . 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  acetaminophen (TYLENOL) 650 MG CR tablet, Take 1,300 mg by mouth every 8 (eight) hours as needed for pain., Disp: , Rfl:  .  allopurinol (ZYLOPRIM) 100 MG tablet, Take 1 tablet (100 mg total) by mouth 2 (two) times daily., Disp: 60 tablet, Rfl: 6 .  amLODipine (NORVASC) 10 MG tablet, Take 1 tablet (10 mg total) by mouth daily with lunch., Disp: 30 tablet, Rfl: 4 .  apixaban (ELIQUIS) 5 MG TABS tablet, Take 1 tablet (5 mg total) by mouth 2 (two) times daily., Disp: 60 tablet, Rfl: 3 .  bisacodyl (DULCOLAX) 5 MG EC tablet, Take 1 tablet (5 mg total) by mouth daily as needed for moderate constipation., Disp: 4 tablet, Rfl: 0 .  carvedilol (COREG) 25 MG tablet, Take 1 tablet (25 mg total) by mouth 2 (two) times daily., Disp: 180 tablet, Rfl: 3 .  cetirizine (ZYRTEC) 10 MG tablet, TAKE 1 TABLET (10 MG TOTAL) BY MOUTH DAILY., Disp: 30 tablet, Rfl: 11 .  docusate sodium (COLACE) 100 MG capsule, Take 1  capsule (100 mg total) by mouth daily., Disp: 10 capsule, Rfl: 0 .  Ensure Max Protein (ENSURE MAX PROTEIN) LIQD, Take 330 mLs (11 oz total) by mouth daily., Disp: 330 mL, Rfl: 3 .  furosemide (LASIX) 20 MG tablet, Take 2 tablets (40 mg total) by mouth daily., Disp: 60 tablet, Rfl: 3 .  gabapentin (NEURONTIN) 300 MG capsule, Take 300 mg by mouth as needed., Disp: , Rfl:  .  hydrALAZINE (APRESOLINE) 25 MG tablet, Take 25 mg by mouth 3 (three) times daily., Disp: , Rfl:  .  isosorbide mononitrate (IMDUR) 60 MG 24 hr tablet, Take 60 mg by mouth daily., Disp: , Rfl:  .  Multiple Vitamin (MULTIVITAMIN WITH MINERALS) TABS tablet, Take 1 tablet by mouth daily., Disp: 30 tablet, Rfl: 0 .  ondansetron (ZOFRAN ODT) 8 MG disintegrating tablet, Take 1 tablet (8 mg total) by mouth every 8 (eight) hours as needed for nausea or vomiting., Disp: 20 tablet, Rfl: 0 .  polyethylene glycol powder (GLYCOLAX/MIRALAX) 17 GM/SCOOP powder, Take 255 g by mouth as directed., Disp: 119 g, Rfl: 0 .  potassium chloride SA (KLOR-CON) 20 MEQ tablet, Take 1.5 tablets (30 mEq total) by mouth daily., Disp: 45 tablet, Rfl: 3 .  rosuvastatin (CRESTOR) 10 MG tablet, Take 1 tablet (10 mg total) by mouth daily., Disp: 90 tablet, Rfl: 3 .  sacubitril-valsartan (ENTRESTO) 49-51 MG, Take 1 tablet by mouth 2 (two) times daily., Disp: 60 tablet, Rfl: 3 .  spironolactone (ALDACTONE) 25 MG tablet, Take 1 tablet (25 mg total) by mouth daily., Disp: 30 tablet, Rfl: 3 .  thiamine 100 MG tablet, Take 1 tablet (100 mg total) by mouth daily., Disp: 30 tablet, Rfl: 0 .  tizanidine (ZANAFLEX) 2 MG capsule, Take 2 mg by mouth 3 (three) times daily., Disp: , Rfl:  .  hydrALAZINE (APRESOLINE) 25 MG tablet, Take 0.5 tablets (12.5 mg total) by mouth 3 (three) times daily. (Patient not taking: Reported on 06/01/2020), Disp: 90 tablet, Rfl: 3 No Known Allergies    Social History   Socioeconomic History  . Marital status: Divorced    Spouse name: Not on  file  . Number of children: Not on file  . Years of education: Not on file  . Highest education level: Not on file  Occupational History  . Not on file  Tobacco Use  . Smoking status: Never Smoker  . Smokeless tobacco: Never Used  Vaping Use  . Vaping Use: Never used  Substance and Sexual Activity  . Alcohol use: No  . Drug use: No  . Sexual activity: Yes  Other Topics Concern  . Not on file  Social History Narrative  . Not on file   Social Determinants of Health   Financial Resource Strain:   . Difficulty of Paying Living Expenses: Not on file  Food Insecurity:   . Worried About [place] in the Last Year: Not on file  . Ran Out of Food in the Last Year: Not on file  Transportation Needs:   . Lack of Transportation (Medical): Not on file  . Lack of Transportation (Non-Medical): Not on file  Physical Activity:   . Days of Exercise per Week: Not on file  . Minutes of Exercise per Session: Not on file  Stress:   . Feeling of Stress : Not on file  Social Connections:   . Frequency of Communication with Friends and Family: Not on file  . Frequency of Social Gatherings with Friends and Family: Not on file  . Attends Religious Services: Not on file  . Active Member of Clubs or Organizations: Not on file  . Attends Archivist Meetings: Not on file  . Marital Status: Not on file  Intimate Partner Violence:   . Fear of Current or Ex-Partner: Not on file  . Emotionally Abused: Not on file  . Physically Abused: Not on file  . Sexually Abused: Not on file    Physical Exam      Future Appointments  Date Time Provider [place]  06/06/2020 10:30 AM [facility] None  06/12/2020  1:00 PM [facility] PHARMACY [facility] None  08/29/2020  8:40 AM Larey Dresser, MD [facility] None    BP (!) 144/100   Pulse 72   Temp (!) 97.2 F (36.2 C)   Resp 18   Wt 219 lb (99.3 kg)   SpO2 99%   BMI 28.89 kg/m   Weight yesterday-220 Last visit  weight-225  Came out today for med check and f/u on med changes from clinic visit.  Pt reports he has been feeling good.  Pt denies any dizziness, no h/a since recent med changes.  He had all meds this morning.  Still sounds like he is in flutter. Advised him to take it easy until he gets these procedures done.   --needs amlodipine, new dose of hydralazine sent to pharmacy.  Called clinic and jasmine was able to get that done. Pt said he will  p/u today.   Marylouise Stacks, [facility] Paramedic  06/01/20

## 2020-06-06 ENCOUNTER — Other Ambulatory Visit (HOSPITAL_COMMUNITY)
Admission: RE | Admit: 2020-06-06 | Discharge: 2020-06-06 | Disposition: A | Discharge status: Home / Self Care | Payer: Medicaid Other | Source: Ambulatory Visit | Attending: Cardiology | Admitting: Cardiology

## 2020-06-06 DIAGNOSIS — Z01812 Encounter for preprocedural laboratory examination: Secondary | ICD-10-CM | POA: Diagnosis present

## 2020-06-06 DIAGNOSIS — Z20822 Contact with and (suspected) exposure to covid-19: Secondary | ICD-10-CM | POA: Insufficient documentation

## 2020-06-06 LAB — SARS CORONAVIRUS 2 (TAT 6-24 HRS): SARS Coronavirus 2: NEGATIVE

## 2020-06-08 ENCOUNTER — Ambulatory Visit (HOSPITAL_BASED_OUTPATIENT_CLINIC_OR_DEPARTMENT_OTHER)
Admission: RE | Admit: 2020-06-08 | Discharge: 2020-06-08 | Disposition: A | Discharge status: Home / Self Care | Payer: Medicaid Other | Source: Ambulatory Visit | Attending: Cardiology | Admitting: Cardiology

## 2020-06-08 ENCOUNTER — Ambulatory Visit (HOSPITAL_COMMUNITY): Payer: Medicaid Other | Admitting: Anesthesiology

## 2020-06-08 ENCOUNTER — Encounter (HOSPITAL_COMMUNITY)
Admission: RE | Disposition: A | Discharge status: Home / Self Care | Payer: Self-pay | Source: Home / Self Care | Attending: Cardiology

## 2020-06-08 ENCOUNTER — Encounter (HOSPITAL_COMMUNITY): Payer: Self-pay | Admitting: Cardiology

## 2020-06-08 ENCOUNTER — Other Ambulatory Visit: Payer: Self-pay

## 2020-06-08 ENCOUNTER — Ambulatory Visit (HOSPITAL_COMMUNITY)
Admission: RE | Admit: 2020-06-08 | Discharge: 2020-06-08 | Disposition: A | Discharge status: Home / Self Care | Payer: Medicaid Other | Attending: Cardiology | Admitting: Cardiology

## 2020-06-08 DIAGNOSIS — I081 Rheumatic disorders of both mitral and tricuspid valves: Secondary | ICD-10-CM | POA: Diagnosis not present

## 2020-06-08 DIAGNOSIS — I5042 Chronic combined systolic (congestive) and diastolic (congestive) heart failure: Secondary | ICD-10-CM | POA: Diagnosis not present

## 2020-06-08 DIAGNOSIS — Z8249 Family history of ischemic heart disease and other diseases of the circulatory system: Secondary | ICD-10-CM | POA: Diagnosis not present

## 2020-06-08 DIAGNOSIS — I4891 Unspecified atrial fibrillation: Secondary | ICD-10-CM | POA: Diagnosis not present

## 2020-06-08 DIAGNOSIS — I428 Other cardiomyopathies: Secondary | ICD-10-CM | POA: Insufficient documentation

## 2020-06-08 DIAGNOSIS — I110 Hypertensive heart disease with heart failure: Secondary | ICD-10-CM | POA: Diagnosis not present

## 2020-06-08 DIAGNOSIS — I2510 Atherosclerotic heart disease of native coronary artery without angina pectoris: Secondary | ICD-10-CM | POA: Insufficient documentation

## 2020-06-08 DIAGNOSIS — M109 Gout, unspecified: Secondary | ICD-10-CM | POA: Insufficient documentation

## 2020-06-08 DIAGNOSIS — N183 Chronic kidney disease, stage 3 unspecified: Secondary | ICD-10-CM | POA: Diagnosis not present

## 2020-06-08 DIAGNOSIS — I5032 Chronic diastolic (congestive) heart failure: Secondary | ICD-10-CM | POA: Diagnosis not present

## 2020-06-08 DIAGNOSIS — I4892 Unspecified atrial flutter: Secondary | ICD-10-CM

## 2020-06-08 DIAGNOSIS — I340 Nonrheumatic mitral (valve) insufficiency: Secondary | ICD-10-CM

## 2020-06-08 DIAGNOSIS — Z7901 Long term (current) use of anticoagulants: Secondary | ICD-10-CM | POA: Diagnosis not present

## 2020-06-08 DIAGNOSIS — I361 Nonrheumatic tricuspid (valve) insufficiency: Secondary | ICD-10-CM | POA: Diagnosis not present

## 2020-06-08 DIAGNOSIS — Z79899 Other long term (current) drug therapy: Secondary | ICD-10-CM | POA: Diagnosis not present

## 2020-06-08 DIAGNOSIS — I130 Hypertensive heart and chronic kidney disease with heart failure and stage 1 through stage 4 chronic kidney disease, or unspecified chronic kidney disease: Secondary | ICD-10-CM | POA: Diagnosis not present

## 2020-06-08 HISTORY — PX: TEE WITHOUT CARDIOVERSION: SHX5443

## 2020-06-08 HISTORY — PX: CARDIOVERSION: SHX1299

## 2020-06-08 LAB — POCT I-STAT, CHEM 8
BUN: 17 mg/dL (ref 6–20)
BUN: 25 mg/dL — ABNORMAL HIGH (ref 6–20)
Calcium, Ion: 1.04 mmol/L — ABNORMAL LOW (ref 1.15–1.40)
Calcium, Ion: 1.14 mmol/L — ABNORMAL LOW (ref 1.15–1.40)
Chloride: 107 mmol/L (ref 98–111)
Chloride: 107 mmol/L (ref 98–111)
Creatinine, Ser: 1.2 mg/dL (ref 0.61–1.24)
Creatinine, Ser: 1.3 mg/dL — ABNORMAL HIGH (ref 0.61–1.24)
Glucose, Bld: 105 mg/dL — ABNORMAL HIGH (ref 70–99)
Glucose, Bld: 97 mg/dL (ref 70–99)
HCT: 44 % (ref 39.0–52.0)
HCT: 44 % (ref 39.0–52.0)
Hemoglobin: 15 g/dL (ref 13.0–17.0)
Hemoglobin: 15 g/dL (ref 13.0–17.0)
Potassium: 4.3 mmol/L (ref 3.5–5.1)
Potassium: 7.4 mmol/L (ref 3.5–5.1)
Sodium: 137 mmol/L (ref 135–145)
Sodium: 140 mmol/L (ref 135–145)
TCO2: 21 mmol/L — ABNORMAL LOW (ref 22–32)
TCO2: 25 mmol/L (ref 22–32)

## 2020-06-08 SURGERY — ECHOCARDIOGRAM, TRANSESOPHAGEAL
Anesthesia: General

## 2020-06-08 MED ORDER — PROPOFOL 500 MG/50ML IV EMUL
INTRAVENOUS | Status: DC | PRN
  Administered 2020-06-08: 200 ug/kg/min via INTRAVENOUS

## 2020-06-08 MED ORDER — SODIUM CHLORIDE 0.9 % IV SOLN: INTRAVENOUS | Status: DC

## 2020-06-08 MED ORDER — LIDOCAINE 2% (20 MG/ML) 5 ML SYRINGE
INTRAMUSCULAR | Status: DC | PRN
  Administered 2020-06-08: 60 mg via INTRAVENOUS

## 2020-06-08 MED ORDER — PROPOFOL 10 MG/ML IV BOLUS
INTRAVENOUS | Status: DC | PRN
  Administered 2020-06-08: 30 mg via INTRAVENOUS

## 2020-06-08 MED ORDER — BUTAMBEN-TETRACAINE-BENZOCAINE 2-2-14 % EX AERO
INHALATION_SPRAY | CUTANEOUS | Status: DC | PRN
  Administered 2020-06-08: 2 via TOPICAL

## 2020-06-08 NOTE — CV Procedure (Signed)
Procedure: TEE  Sedation: Per anesthesiology  Indication: Atrial fibrillation  Findings: Please see echo section for full report.  Normal LV size with mild LV hypertrophy.  EF 50%, mild diffuse hypokinesis.  Normal right ventricular size and systolic function.  Moderate right atrial enlargement.  Moderate left atrial enlargement, no LA appendage thrombus.  No PFO or ASD by color doppler.  Moderate tricuspid regurgitation, there is a small mass on the TV that has been seen in the past, suspect fibroelastoma.  Trileaflet aortic valve, no stenosis or regurgitation.  There is prolapse of primarily the A2 segment of the anterior leaflet with highly eccentric mitral regurgitation. Unable to measure PISA due to extreme eccentricity of jet.  Looks severe by 3D color doppler, more moderate by 2D color. No systolic flow reversal in the pulmonary vein doppler pattern.  At least moderate-severe MR.  Normal caliber aorta with no significant plaque.   1. May proceed to DCCV. 2. Moderate to severe mitral regurgitation (highly eccentric, prolapsing A2 segment of anteiror leaflet). Will need further workup.   Daniel Joseph 06/08/2020 9:42 AM

## 2020-06-08 NOTE — Anesthesia Preprocedure Evaluation (Signed)
Anesthesia Evaluation  Patient identified by MRN, date of birth, ID band Patient awake    Reviewed: Allergy & Precautions, NPO status , Patient's Chart, lab work & pertinent test results  Airway Mallampati: I  TM Distance: >3 FB Neck ROM: Full    Dental   Pulmonary sleep apnea ,    Pulmonary exam normal        Cardiovascular hypertension, Pt. on medications Normal cardiovascular exam     Neuro/Psych    GI/Hepatic   Endo/Other    Renal/GU Renal InsufficiencyRenal disease     Musculoskeletal   Abdominal   Peds  Hematology   Anesthesia Other Findings   Reproductive/Obstetrics                             Anesthesia Physical Anesthesia Plan  ASA: III  Anesthesia Plan: General   Post-op Pain Management:    Induction: Intravenous  PONV Risk Score and Plan: 2  Airway Management Planned: Mask and Nasal Cannula  Additional Equipment:   Intra-op Plan:   Post-operative Plan:   Informed Consent: I have reviewed the patients History and Physical, chart, labs and discussed the procedure including the risks, benefits and alternatives for the proposed anesthesia with the patient or authorized representative who has indicated his/her understanding and acceptance.       Plan Discussed with: CRNA and Surgeon  Anesthesia Plan Comments:         Anesthesia Quick Evaluation

## 2020-06-08 NOTE — Interval H&P Note (Signed)
History and Physical Interval Note:  06/08/2020 9:13 AM  Daniel Joseph  has presented today for surgery, with the diagnosis of MITRAL REGURGITATION.  The various methods of treatment have been discussed with the patient and family. After consideration of risks, benefits and other options for treatment, the patient has consented to  Procedure(s): TRANSESOPHAGEAL ECHOCARDIOGRAM (TEE) (N/A) CARDIOVERSION (N/A) as a surgical intervention.  The patient's history has been reviewed, patient examined, no change in status, stable for surgery.  I have reviewed the patient's chart and labs.  Questions were answered to the patient's satisfaction.     Ailine Hefferan [employer]

## 2020-06-08 NOTE — Procedures (Signed)
Electrical Cardioversion Procedure Note WESAM GEARHART [id number] [birth year]  Procedure: Electrical Cardioversion Indications:  Atrial Fibrillation  Procedure Details Consent: Risks of procedure as well as the alternatives and risks of each were explained to the (patient/caregiver).  Consent for procedure obtained. Time Out: Verified patient identification, verified procedure, site/side was marked, verified correct patient position, special equipment/implants available, medications/allergies/relevent history reviewed, required imaging and test results available.  Performed  Patient placed on cardiac monitor, pulse oximetry, supplemental oxygen as necessary.  Sedation given: Propofol per anesthesiology Pacer pads placed anterior and posterior chest.  Cardioverted 1 time(s).  Cardioverted at [place].  Evaluation Findings: Post procedure EKG shows: NSR Complications: None Patient did tolerate procedure well.   Loralie Champagne 06/08/2020, 9:42 AM

## 2020-06-08 NOTE — Transfer of Care (Signed)
Immediate Anesthesia Transfer of Care Note  Patient: Daniel Joseph  Procedure(s) Performed: TRANSESOPHAGEAL ECHOCARDIOGRAM (TEE) (N/A ) CARDIOVERSION (N/A )  Patient Location: Endoscopy Unit  Anesthesia Type:MAC  Level of Consciousness: awake  Airway & Oxygen Therapy: Patient Spontanous Breathing and Patient connected to nasal cannula oxygen  Post-op Assessment: Report given to RN and Post -op Vital signs reviewed and stable  Post vital signs: Reviewed and stable  Last Vitals:  Vitals Value Taken Time  BP 128/101 06/08/20 0943  Temp    Pulse 71 06/08/20 0943  Resp 28 06/08/20 0943  SpO2 100 % 06/08/20 0943  Vitals shown include unvalidated device data.  Last Pain:  Vitals:   06/08/20 0818  TempSrc: Oral  PainSc: 0-No pain         Complications: No complications documented.

## 2020-06-08 NOTE — Anesthesia Postprocedure Evaluation (Signed)
Anesthesia Post Note  Patient: Daniel Joseph  Procedure(s) Performed: TRANSESOPHAGEAL ECHOCARDIOGRAM (TEE) (N/A ) CARDIOVERSION (N/A )     Anesthesia Post Evaluation No complications documented.  Last Vitals:  Vitals:   06/08/20 0955 06/08/20 1005  BP: 109/80 115/86  Pulse: 70 65  Resp: (!) 24 12  Temp:    SpO2: 97% 98%    Last Pain:  Vitals:   06/08/20 1005  TempSrc:   PainSc: 0-No pain                 Rim Thatch DAVID

## 2020-06-08 NOTE — Anesthesia Procedure Notes (Signed)
Procedure Name: MAC Date/Time: 06/08/2020 9:20 AM Performed by: Lieutenant Diego, CRNA Pre-anesthesia Checklist: Patient identified, Emergency Drugs available, Suction available, Patient being monitored and Timeout performed Oxygen Delivery Method: Nasal cannula Induction Type: IV induction

## 2020-06-08 NOTE — Discharge Instructions (Signed)

## 2020-06-09 ENCOUNTER — Encounter (HOSPITAL_COMMUNITY): Payer: Self-pay | Admitting: Cardiology

## 2020-06-12 ENCOUNTER — Telehealth (HOSPITAL_COMMUNITY): Payer: Self-pay | Admitting: Pharmacist

## 2020-06-12 ENCOUNTER — Inpatient Hospital Stay (HOSPITAL_COMMUNITY): Admission: RE | Admit: 2020-06-12 | Payer: Medicaid Other | Source: Ambulatory Visit

## 2020-06-12 MED ORDER — LOSARTAN POTASSIUM 100 MG PO TABS: 100.0000 mg | ORAL_TABLET | Freq: Every day | ORAL | 3 refills | Status: DC

## 2020-06-12 NOTE — Progress Notes (Signed)
PCP: [name], MD Cardiology: Dr. [name] HF Cardiology: Dr. [name]  HPI:  59 y.o. 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 [name] for evaluation of CHF. 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  Cardiolite in 5/16 showed no ischemia but possible apical infarction.  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  PYP scan was read as equivocal in 5/20 but probably not suggestive of transthyretin amyloidosis.   TEE was done in 6/20 to assess severity of MR.  EF was 50% with mild mitral valve prolapse and moderate MR.  RHC/LHC showed nonobstructive CAD, pulmonary venous hypertension, relatively controlled filling pressures. 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   Echo in 3/21 showed EF 55-60%, normal RV, severe LAE, moderate-severe MR.    Patient's BP has been poorly controlled over time.  He has not been consistent with medication compliance.  Renal artery dopplers were unremarkable in 6/16.   Recently presented to HF Clinic with Dr. [name] on 05/30/20.  BP remained elevated .  He stated that he had taken all his medications.  BP at home was running 150s/90s.  He was short of breath after walking a few blocks, this had not changed.  No chest pain.  No orthopnea/PND.  Weight was stable.  He does not feel palpitations, but he was in rate-controlled atrial flutter in clinic.  No lightheadedness.   Today he returns to HF clinic for pharmacist medication titration. At last visit with MD, losartan 100 mg daily was changed to Entresto 49/51 mg BID. A DCCV was performed on 06/08/2020. He then developed a dry cough with Entresto on 06/12/2020 and was changed back to losartan 100 mg daily. Overall he is feeling well  today. Still notes a dry cough which occurs in the morning and at bedtime. States this typically lasts 10-15 minutes. No dizziness, lightheadedness, chest pain or palpitations. Noted he gets SOB during coughing spells, but otherwise no SOB/DOE. His weight has been stable at home, ranging 219-225 lbs. He takes furosemide 40 mg daily and has not needed any extra. No LEE, PND or orthopnea. BP in clinic was elevated at 154/68. Taking all medications as prescribed and tolerating all medications.    HF/HTN Medications: Amlodipine 10 mg daily Losartan 100 mg daily Carvedilol 25 mg BID Spironolactone 25 mg daily Hydralazine 25 mg TID Isosorbide mononitrate 60 mg daily Furosemide 40 mg daily Potassium Chloride 30 mEq daily  Has the patient been experiencing any side effects to the medications prescribed?  no  Does the patient have any problems obtaining medications due to transportation or finances? No - has Medicaid  Understanding of regimen: good Understanding of indications: good Potential of compliance: good Patient understands to avoid NSAIDs. Patient understands to avoid decongestants.    Pertinent Lab Values (06/08/20): Marland Kitchen Serum creatinine 1.20, BUN 17, Potassium 4.3, Sodium 140  Vital Signs: . Weight: 225.2 lbs lbs (last clinic weight: 225 lbs) . Blood pressure: 154/68 . Heart rate: 76   Assessment: 1. HTN: Negative renal artery dopplers and sleep study in past.  On multiple agents. Historically poorly controlled. It is likely medication noncompliance plays a large role.  He is now being followed by paramedicine Joellen Jersey).  -BP elevated today at 154/68 - Continue Losartan 100 mg daily. Developed dry  cough with Entresto and benazepril.  - Continue carvedilol 25 mg BID - Continue spironolactone 25 mg daily - Increase hydralazine to 50 mg TID and continue Imdur 60 mg daily - Continue amlodipine 10 mg daily - Continue to hold off on doxazosin and clonidine for now.  - Paramedicine  will continue to follow -Reiterated importance of strict med compliance and low-sodium diet   2. 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  Echo in 5/20 showed EF 40-45% with normal RV, possible severe mitral regurgitation, and severe pulmonary hypertension.  PYP scan in 5/20 was read as equivocal, but suspected this was not suggestive of transthyretin amyloidosis. TEE in 5/20 showed EF 50%, mild LV dilation, moderate MR.  RHC/LHC showed mildly elevated PCWP, pulmonary venous hypertension, and nonobstructive CAD.  Cardiac MRI in 8/20 showed EF 53% and had an LGE pattern that was suggestive of possible cardiac sarcoidosis (not amyloidosis).  Echo in 3/21 showed EF up to 55-60%.   -On exam today, he was not volume overloaded. Stable NYHA class II symptoms.  - Continue Furosemide 40 mg daily  - Continue losartan 100 mg daily. Developed dry cough with Entresto and benazepril. - Continue carvedilol 25 mg BID - Continue spironolactone 25 mg daily - Given concern for possible cardiac sarcoidosis, previously arrange for high resolution CT to assess for pulmonary sarcoidosis (not done yet but ordered).  If this is unremarkable, will see if we can get a cardiac PET done at [facility] to assess for cardiac sarcoidosis.  3. Mitral regurgitation: ?severe on 5/20 TTE but TEE in 6/20 showed mild mitral valve prolapse with only moderate MR.  Cardiac MRI also suggested moderate MR. TTE in 3/21 suggested moderate-severe MR.  - Follow for now, may be eventual candidate for MV repair in future.    4. 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  5. 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  - continue Crestor, lipids well controlled on LP 5/21, LDL 57 mg/dL 6. Atrial flutter:  - Continue Eliquis 5 mg bid.  -DCCV completed 06/08/20 - Consider atrial flutter ablation if flutter returns after DCCV.   Plan: 1) Medication  changes: Based on clinical presentation, vital signs and recent labs will increase hydralazine to 50 mg TID.  2) Follow-up: 2 months with Dr. Rush Farmer, PharmD, BCPS, [facility], [place] [facility] Pharmacist [phone]

## 2020-06-12 NOTE — Telephone Encounter (Signed)
Patient left VM stating he was not feeling well today and could not make his Pharmacy Clinic appointment. His dry cough had returned since discontinuing the losartan and starting the Entresto. I instructed him to stop the Entresto and restart the losartan 100 mg daily. New prescription sent to local pharmacy. Will reschedule appointment for next week.   Audry Riles, PharmD, BCPS, BCCP, CPP Heart Failure Clinic Pharmacist [phone]

## 2020-06-13 ENCOUNTER — Other Ambulatory Visit (HOSPITAL_COMMUNITY): Payer: Self-pay

## 2020-06-13 NOTE — Progress Notes (Signed)
Paramedicine Encounter    Patient ID: Daniel Joseph, male    DOB: Nov 29, 1960, 59 y.o.   MRN: [id number]   Patient Care Team: [name], MD as PCP - General (Internal Medicine) Sueanne Margarita, MD as PCP - Cardiology (Cardiology) Larey Dresser, MD as PCP - Advanced Heart Failure (Cardiology)  Patient Active Problem List   Diagnosis Date Noted  . Neuropathy 02/02/2020  . Nausea 02/02/2020  . Acute exacerbation of CHF (congestive heart failure) ([place]) 08/29/2019  . Alcohol abuse 08/29/2019  . Hypoxic Respiratory failure, acute ([place]) 08/29/2019  . PVC's (premature ventricular contractions)   . HTN (hypertension) 03/02/2014  . Chronic combined systolic and diastolic CHF (congestive heart failure) ([place]) 03/02/2014  . Hypertensive cardiomyopathy ([place]) 03/02/2014  . CKD (chronic kidney disease), stage III 02/15/2014  . Neck nodule 02/01/2014  . Eye redness 02/01/2014  . Mitral valve regurgitation 01/21/2014  . 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  acetaminophen (TYLENOL) 650 MG CR tablet, Take 1,300 mg by mouth every 8 (eight) hours as needed for pain., Disp: , Rfl:  .  allopurinol (ZYLOPRIM) 100 MG tablet, Take 1 tablet (100 mg total) by mouth 2 (two) times daily., Disp: 60 tablet, Rfl: 6 .  amLODipine (NORVASC) 10 MG tablet, Take 1 tablet (10 mg total) by mouth daily with lunch. (Patient taking differently: Take 10 mg by mouth daily. ), Disp: 30 tablet, Rfl: 4 .  apixaban (ELIQUIS) 5 MG TABS tablet, Take 1 tablet (5 mg total) by mouth 2 (two) times daily., Disp: 60 tablet, Rfl: 3 .  bisacodyl (DULCOLAX) 5 MG EC tablet, Take 1 tablet (5 mg total) by mouth daily as needed for moderate constipation., Disp: 4 tablet, Rfl: 0 .  carvedilol (COREG) 25 MG tablet, Take 1 tablet (25 mg total) by mouth 2 (two) times daily., Disp: 180 tablet, Rfl: 3 .  cetirizine (ZYRTEC) 10 MG tablet, TAKE 1 TABLET (10 MG TOTAL) BY MOUTH DAILY., Disp: 30 tablet, Rfl:  11 .  docusate sodium (COLACE) 100 MG capsule, Take 1 capsule (100 mg total) by mouth daily. (Patient taking differently: Take 100 mg by mouth daily as needed for mild constipation. ), Disp: 10 capsule, Rfl: 0 .  Ensure Max Protein (ENSURE MAX PROTEIN) LIQD, Take 330 mLs (11 oz total) by mouth daily., Disp: 330 mL, Rfl: 3 .  furosemide (LASIX) 20 MG tablet, Take 2 tablets (40 mg total) by mouth daily., Disp: 60 tablet, Rfl: 3 .  hydrALAZINE (APRESOLINE) 25 MG tablet, Take 1 tablet (25 mg total) by mouth 3 (three) times daily., Disp: 90 tablet, Rfl: 3 .  isosorbide mononitrate (IMDUR) 60 MG 24 hr tablet, Take 60 mg by mouth daily., Disp: , Rfl:  .  losartan (COZAAR) 100 MG tablet, Take 1 tablet (100 mg total) by mouth daily., Disp: 90 tablet, Rfl: 3 .  Multiple Vitamin (MULTIVITAMIN WITH MINERALS) TABS tablet, Take 1 tablet by mouth daily., Disp: 30 tablet, Rfl: 0 .  polyethylene glycol powder (GLYCOLAX/MIRALAX) 17 GM/SCOOP powder, Take 255 g by mouth as directed. (Patient taking differently: Take 1 Container by mouth daily as needed for mild constipation or moderate constipation. ), Disp: 119 g, Rfl: 0 .  potassium chloride SA (KLOR-CON) 20 MEQ tablet, Take 1.5 tablets (30 mEq total) by mouth daily., Disp: 45 tablet, Rfl: 3 .  spironolactone (ALDACTONE) 25 MG tablet, Take 1 tablet (25 mg total) by mouth daily., Disp: 30 tablet, Rfl: 3 .  tizanidine (  ZANAFLEX) 2 MG capsule, Take 2 mg by mouth 3 (three) times daily as needed for muscle spasms. , Disp: , Rfl:  .  gabapentin (NEURONTIN) 300 MG capsule, Take 300 mg by mouth as needed. (Patient not taking: Reported on 06/13/2020), Disp: , Rfl:  .  ondansetron (ZOFRAN ODT) 8 MG disintegrating tablet, Take 1 tablet (8 mg total) by mouth every 8 (eight) hours as needed for nausea or vomiting. (Patient not taking: Reported on 06/13/2020), Disp: 20 tablet, Rfl: 0 .  rosuvastatin (CRESTOR) 10 MG tablet, Take 1 tablet (10 mg total) by mouth daily., Disp: 90 tablet,  Rfl: 3 Allergies  Allergen Reactions  . Entresto [Sacubitril-Valsartan] Cough  . Benazepril Cough      Social History   Socioeconomic History  . Marital status: Divorced    Spouse name: Not on file  . Number of children: Not on file  . Years of education: Not on file  . Highest education level: Not on file  Occupational History  . Not on file  Tobacco Use  . Smoking status: Never Smoker  . Smokeless tobacco: Never Used  Vaping Use  . Vaping Use: Never used  Substance and Sexual Activity  . Alcohol use: No  . Drug use: No  . Sexual activity: Yes  Other Topics Concern  . Not on file  Social History Narrative  . Not on file   Social Determinants of Health   Financial Resource Strain:   . Difficulty of Paying Living Expenses: Not on file  Food Insecurity:   . Worried About Charity fundraiser in the Last Year: Not on file  . Ran Out of Food in the Last Year: Not on file  Transportation Needs:   . Lack of Transportation (Medical): Not on file  . Lack of Transportation (Non-Medical): Not on file  Physical Activity:   . Days of Exercise per Week: Not on file  . Minutes of Exercise per Session: Not on file  Stress:   . Feeling of Stress : Not on file  Social Connections:   . Frequency of Communication with Friends and Family: Not on file  . Frequency of Social Gatherings with Friends and Family: Not on file  . Attends Religious Services: Not on file  . Active Member of Clubs or Organizations: Not on file  . Attends Archivist Meetings: Not on file  . Marital Status: Not on file  Intimate Partner Violence:   . Fear of Current or Ex-Partner: Not on file  . Emotionally Abused: Not on file  . Physically Abused: Not on file  . Sexually Abused: Not on file    Physical Exam      Future Appointments  Date Time Provider [place]  06/19/2020  3:00 PM [place] [facility] None  08/29/2020  8:40 AM Larey Dresser, MD [facility] None    BP (!)  140/98   Pulse 70   Temp 99.1 F (37.3 C)   Resp 18   Wt 219 lb (99.3 kg)   SpO2 99%   BMI 28.89 kg/m   Weight yesterday-219 Last visit weight-219 @ clinic   Pt seen today at home, he cancelled his appoint yesterday-he reports he started coughing again with the entresto. He spoke to lauren so switched him back to losartan. He resch his appoint with her for next week.  Pt reports his procedure went well last week.  He needs to get the losartan and states he will get that today.  He  brought his pill box out but it was not filled, he said he would fill it this afternoon.  Will need to keep check on the dates of his pill bottles to see if he is actually taking the meds.  Lungs clear, no dizziness, b/p better but still elevated-he needs to get the losartan, he stopped the entresto a couple days ago from his bad coughing. Improved breathing. No edema noted. Will see him next week at clinic appoint.    Marylouise Stacks, [facility] Paramedic  06/13/20

## 2020-06-19 ENCOUNTER — Other Ambulatory Visit: Payer: Self-pay

## 2020-06-19 ENCOUNTER — Ambulatory Visit (HOSPITAL_COMMUNITY)
Admission: RE | Admit: 2020-06-19 | Discharge: 2020-06-19 | Disposition: A | Discharge status: Home / Self Care | Payer: Medicaid Other | Source: Ambulatory Visit | Attending: Cardiology | Admitting: Cardiology

## 2020-06-19 VITALS — BP 154/68 | HR 76 | Wt 225.2 lb

## 2020-06-19 DIAGNOSIS — I4892 Unspecified atrial flutter: Secondary | ICD-10-CM | POA: Insufficient documentation

## 2020-06-19 DIAGNOSIS — I272 Pulmonary hypertension, unspecified: Secondary | ICD-10-CM | POA: Insufficient documentation

## 2020-06-19 DIAGNOSIS — I341 Nonrheumatic mitral (valve) prolapse: Secondary | ICD-10-CM | POA: Diagnosis not present

## 2020-06-19 DIAGNOSIS — Z79899 Other long term (current) drug therapy: Secondary | ICD-10-CM | POA: Diagnosis not present

## 2020-06-19 DIAGNOSIS — I110 Hypertensive heart disease with heart failure: Secondary | ICD-10-CM | POA: Insufficient documentation

## 2020-06-19 DIAGNOSIS — I5032 Chronic diastolic (congestive) heart failure: Secondary | ICD-10-CM | POA: Insufficient documentation

## 2020-06-19 DIAGNOSIS — Z7901 Long term (current) use of anticoagulants: Secondary | ICD-10-CM | POA: Insufficient documentation

## 2020-06-19 DIAGNOSIS — I428 Other cardiomyopathies: Secondary | ICD-10-CM | POA: Diagnosis not present

## 2020-06-19 DIAGNOSIS — I2510 Atherosclerotic heart disease of native coronary artery without angina pectoris: Secondary | ICD-10-CM | POA: Insufficient documentation

## 2020-06-19 DIAGNOSIS — I5042 Chronic combined systolic (congestive) and diastolic (congestive) heart failure: Secondary | ICD-10-CM

## 2020-06-19 MED ORDER — HYDRALAZINE HCL 50 MG PO TABS
50.0000 mg | ORAL_TABLET | Freq: Three times a day (TID) | ORAL | 11 refills | Status: DC
Start: 2020-06-19 — End: 2020-08-29

## 2020-06-19 NOTE — Patient Instructions (Addendum)
It was a pleasure seeing you today!  MEDICATIONS: -We are changing your medications today -Increase hydralazine to 50 mg (1 tablet) three times a day. You may take 2 tablets of the 25 mg strength three times daily until you pick up the new strength.  -Call if you have questions about your medications.   NEXT APPOINTMENT: Return to clinic in 2 months with Dr. [name].  In general, to take care of your heart failure: -Limit your fluid intake to 2 Liters (half-gallon) per day.   -Limit your salt intake to ideally 2-3 grams (2000-3000 mg) per day. -Weigh yourself daily and record, and bring that "weight diary" to your next appointment.  (Weight gain of 2-3 pounds in 1 day typically means fluid weight.) -The medications for your heart are to help your heart and help you live longer.   -Please contact us before stopping any of your heart medications.  Call the clinic at [phone] with questions or to reschedule future appointments.

## 2020-06-20 ENCOUNTER — Ambulatory Visit: Payer: Medicaid Other

## 2020-07-04 ENCOUNTER — Other Ambulatory Visit (HOSPITAL_COMMUNITY): Payer: Self-pay | Admitting: Adult Health

## 2020-07-05 ENCOUNTER — Telehealth (HOSPITAL_COMMUNITY): Payer: Self-pay

## 2020-07-05 NOTE — Telephone Encounter (Signed)
Pt called me at lunch time to cancel our 3pm appointment for today. resch for early Monday AM.  Pt denied any issues or complaints at this time.   Marylouise Stacks, EMT-Paramedic  07/05/20

## 2020-07-10 ENCOUNTER — Other Ambulatory Visit (HOSPITAL_COMMUNITY): Payer: Self-pay

## 2020-07-10 NOTE — Progress Notes (Signed)
Paramedicine Encounter    Patient ID: Daniel Joseph, male    DOB: 1961/01/01, 59 y.o.   MRN: [id number]   Patient Care Team: [name], MD as PCP - General (Internal Medicine) Sueanne Margarita, MD as PCP - Cardiology (Cardiology) Larey Dresser, MD as PCP - Advanced Heart Failure (Cardiology)  Patient Active Problem List   Diagnosis Date Noted  . Neuropathy 02/02/2020  . Nausea 02/02/2020  . Acute exacerbation of CHF (congestive heart failure) ([place]) 08/29/2019  . Alcohol abuse 08/29/2019  . Hypoxic Respiratory failure, acute ([place]) 08/29/2019  . PVC's (premature ventricular contractions)   . HTN (hypertension) 03/02/2014  . Chronic combined systolic and diastolic CHF (congestive heart failure) ([place]) 03/02/2014  . Hypertensive cardiomyopathy ([place]) 03/02/2014  . CKD (chronic kidney disease), stage III ([place]) 02/15/2014  . Neck nodule 02/01/2014  . Eye redness 02/01/2014  . Mitral valve regurgitation 01/21/2014  . 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  acetaminophen (TYLENOL) 650 MG CR tablet, Take 1,300 mg by mouth every 8 (eight) hours as needed for pain., Disp: , Rfl:  .  allopurinol (ZYLOPRIM) 100 MG tablet, Take 1 tablet (100 mg total) by mouth 2 (two) times daily., Disp: 60 tablet, Rfl: 6 .  amLODipine (NORVASC) 10 MG tablet, Take 1 tablet (10 mg total) by mouth daily with lunch. (Patient taking differently: Take 10 mg by mouth daily. ), Disp: 30 tablet, Rfl: 4 .  apixaban (ELIQUIS) 5 MG TABS tablet, Take 1 tablet (5 mg total) by mouth 2 (two) times daily., Disp: 60 tablet, Rfl: 3 .  bisacodyl (DULCOLAX) 5 MG EC tablet, Take 1 tablet (5 mg total) by mouth daily as needed for moderate constipation., Disp: 4 tablet, Rfl: 0 .  carvedilol (COREG) 25 MG tablet, Take 1 tablet (25 mg total) by mouth 2 (two) times daily., Disp: 180 tablet, Rfl: 3 .  cetirizine (ZYRTEC) 10 MG tablet, TAKE 1 TABLET (10 MG TOTAL) BY MOUTH DAILY., Disp: 30  tablet, Rfl: 11 .  docusate sodium (COLACE) 100 MG capsule, Take 1 capsule (100 mg total) by mouth daily. (Patient taking differently: Take 100 mg by mouth daily as needed for mild constipation. ), Disp: 10 capsule, Rfl: 0 .  Ensure Max Protein (ENSURE MAX PROTEIN) LIQD, Take 330 mLs (11 oz total) by mouth daily., Disp: 330 mL, Rfl: 3 .  furosemide (LASIX) 20 MG tablet, Take 2 tablets (40 mg total) by mouth daily., Disp: 60 tablet, Rfl: 3 .  hydrALAZINE (APRESOLINE) 50 MG tablet, Take 1 tablet (50 mg total) by mouth 3 (three) times daily., Disp: 90 tablet, Rfl: 11 .  isosorbide mononitrate (IMDUR) 60 MG 24 hr tablet, Take 60 mg by mouth daily., Disp: , Rfl:  .  losartan (COZAAR) 100 MG tablet, Take 1 tablet (100 mg total) by mouth daily., Disp: 90 tablet, Rfl: 3 .  Multiple Vitamin (MULTIVITAMIN WITH MINERALS) TABS tablet, Take 1 tablet by mouth daily., Disp: 30 tablet, Rfl: 0 .  polyethylene glycol powder (GLYCOLAX/MIRALAX) 17 GM/SCOOP powder, Take 255 g by mouth as directed. (Patient taking differently: Take 1 Container by mouth daily as needed for mild constipation or moderate constipation. ), Disp: 119 g, Rfl: 0 .  potassium chloride SA (KLOR-CON) 20 MEQ tablet, Take 1.5 tablets (30 mEq total) by mouth daily., Disp: 45 tablet, Rfl: 3 .  rosuvastatin (CRESTOR) 10 MG tablet, Take 1 tablet (10 mg total) by mouth daily., Disp: 90 tablet, Rfl: 3 .  spironolactone (ALDACTONE) 25 MG tablet, TAKE ONE TABLET BY MOUTH ONCE DAILY, Disp: 30 tablet, Rfl: 3 .  gabapentin (NEURONTIN) 300 MG capsule, Take 300 mg by mouth as needed.  (Patient not taking: Reported on 07/10/2020), Disp: , Rfl:  .  ondansetron (ZOFRAN ODT) 8 MG disintegrating tablet, Take 1 tablet (8 mg total) by mouth every 8 (eight) hours as needed for nausea or vomiting. (Patient not taking: Reported on 07/10/2020), Disp: 20 tablet, Rfl: 0 .  tizanidine (ZANAFLEX) 2 MG capsule, Take 2 mg by mouth 3 (three) times daily as needed for muscle spasms.   (Patient not taking: Reported on 07/10/2020), Disp: , Rfl:  Allergies  Allergen Reactions  . Entresto [Sacubitril-Valsartan] Cough  . Benazepril Cough      Social History   Socioeconomic History  . Marital status: Divorced    Spouse name: Not on file  . Number of children: Not on file  . Years of education: Not on file  . Highest education level: Not on file  Occupational History  . Not on file  Tobacco Use  . Smoking status: Never Smoker  . Smokeless tobacco: Never Used  Vaping Use  . Vaping Use: Never used  Substance and Sexual Activity  . Alcohol use: No  . Drug use: No  . Sexual activity: Yes  Other Topics Concern  . Not on file  Social History Narrative  . Not on file   Social Determinants of Health   Financial Resource Strain:   . Difficulty of Paying Living Expenses: Not on file  Food Insecurity:   . Worried About Charity fundraiser in the Last Year: Not on file  . Ran Out of Food in the Last Year: Not on file  Transportation Needs:   . Lack of Transportation (Medical): Not on file  . Lack of Transportation (Non-Medical): Not on file  Physical Activity:   . Days of Exercise per Week: Not on file  . Minutes of Exercise per Session: Not on file  Stress:   . Feeling of Stress : Not on file  Social Connections:   . Frequency of Communication with Friends and Family: Not on file  . Frequency of Social Gatherings with Friends and Family: Not on file  . Attends Religious Services: Not on file  . Active Member of Clubs or Organizations: Not on file  . Attends Archivist Meetings: Not on file  . Marital Status: Not on file  Intimate Partner Violence:   . Fear of Current or Ex-Partner: Not on file  . Emotionally Abused: Not on file  . Physically Abused: Not on file  . Sexually Abused: Not on file    Physical Exam      Future Appointments  Date Time Provider [place]  07/15/2020  9:45 AM [place] [facility]   08/29/2020  8:40 AM Larey Dresser, MD [facility] None    BP (!) 154/100   Pulse 66   Resp 18   Wt 220 lb (99.8 kg)   SpO2 99%   BMI 29.03 kg/m   Weight yesterday-220 Last visit weight-219  Pt reports he is doing well. He states that cough has went away. He denies any increased sob, no palpitations or heart racing. No dizziness, no c/p.  He states he has been taking his b/p at home and its been 150s/90s.  Pt states he might miss one dose a week of his meds.  meds verified, he uses pill box sometimes.  im really not sure about his consistency in taking his meds-some of his bottles are a few months old but he said they gave him a lot from [place] that carried over on those rx's, he did just get some from pharmacy refilled. B/p still remaining elevated.   Marylouise Stacks, [facility] Paramedic  07/10/20

## 2020-07-15 ENCOUNTER — Ambulatory Visit: Payer: Medicaid Other | Attending: Internal Medicine

## 2020-07-15 DIAGNOSIS — Z23 Encounter for immunization: Secondary | ICD-10-CM

## 2020-07-15 NOTE — Progress Notes (Signed)
° °  Covid-19 Vaccination Clinic  Name:  JAYSHON DOMMER    MRN: [id number] DOB: 01-29-1961  07/15/2020  Mr. Figgs was observed post Covid-19 immunization for 15 minutes without incident. He was provided with Vaccine Information Sheet and instruction to access the V-Safe system.   Mr. Leyda was instructed to call 911 with any severe reactions post vaccine:  Difficulty breathing   Swelling of face and throat   A fast heartbeat   A bad rash all over body   Dizziness and weakness

## 2020-07-25 ENCOUNTER — Telehealth (HOSPITAL_COMMUNITY): Payer: Self-pay

## 2020-07-25 NOTE — Telephone Encounter (Signed)
Attempted to reach pt regarding home visit this week. No answer and I was not able to LVM due to mailbox being full.   Marylouise Stacks, EMT-Paramedic  07/25/20

## 2020-08-03 ENCOUNTER — Other Ambulatory Visit (HOSPITAL_COMMUNITY): Payer: Self-pay

## 2020-08-03 NOTE — Progress Notes (Signed)
Paramedicine Encounter    Patient ID: Daniel Joseph, male    DOB: [birth year], 59 y.o.   MRN: [id number]   Patient Care Team: [name], MD as PCP - General (Internal Medicine) Sueanne Margarita, MD as PCP - Cardiology (Cardiology) Larey Dresser, MD as PCP - Advanced Heart Failure (Cardiology)  Patient Active Problem List   Diagnosis Date Noted  . Neuropathy 02/02/2020  . Nausea 02/02/2020  . Acute exacerbation of CHF (congestive heart failure) ([place]) 08/29/2019  . Alcohol abuse 08/29/2019  . Hypoxic Respiratory failure, acute ([place]) 08/29/2019  . PVC's (premature ventricular contractions)   . HTN (hypertension) 03/02/2014  . Chronic combined systolic and diastolic CHF (congestive heart failure) ([place]) 03/02/2014  . Hypertensive cardiomyopathy ([place]) 03/02/2014  . CKD (chronic kidney disease), stage III ([place]) 02/15/2014  . Neck nodule 02/01/2014  . Eye redness 02/01/2014  . Mitral valve regurgitation 01/21/2014  . 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  acetaminophen (TYLENOL) 650 MG CR tablet, Take 1,300 mg by mouth every 8 (eight) hours as needed for pain., Disp: , Rfl:  .  allopurinol (ZYLOPRIM) 100 MG tablet, Take 1 tablet (100 mg total) by mouth 2 (two) times daily., Disp: 60 tablet, Rfl: 6 .  amLODipine (NORVASC) 10 MG tablet, Take 1 tablet (10 mg total) by mouth daily with lunch. (Patient taking differently: Take 10 mg by mouth daily. ), Disp: 30 tablet, Rfl: 4 .  apixaban (ELIQUIS) 5 MG TABS tablet, Take 1 tablet (5 mg total) by mouth 2 (two) times daily., Disp: 60 tablet, Rfl: 3 .  bisacodyl (DULCOLAX) 5 MG EC tablet, Take 1 tablet (5 mg total) by mouth daily as needed for moderate constipation., Disp: 4 tablet, Rfl: 0 .  carvedilol (COREG) 25 MG tablet, Take 1 tablet (25 mg total) by mouth 2 (two) times daily., Disp: 180 tablet, Rfl: 3 .  cetirizine (ZYRTEC) 10 MG tablet, TAKE 1 TABLET (10 MG TOTAL) BY MOUTH DAILY. (Patient taking  differently: Take 10 mg by mouth daily as needed. ), Disp: 30 tablet, Rfl: 11 .  docusate sodium (COLACE) 100 MG capsule, Take 1 capsule (100 mg total) by mouth daily. (Patient taking differently: Take 100 mg by mouth daily as needed for mild constipation. ), Disp: 10 capsule, Rfl: 0 .  Ensure Max Protein (ENSURE MAX PROTEIN) LIQD, Take 330 mLs (11 oz total) by mouth daily., Disp: 330 mL, Rfl: 3 .  furosemide (LASIX) 20 MG tablet, Take 2 tablets (40 mg total) by mouth daily., Disp: 60 tablet, Rfl: 3 .  hydrALAZINE (APRESOLINE) 50 MG tablet, Take 1 tablet (50 mg total) by mouth 3 (three) times daily., Disp: 90 tablet, Rfl: 11 .  isosorbide mononitrate (IMDUR) 60 MG 24 hr tablet, Take 60 mg by mouth daily., Disp: , Rfl:  .  losartan (COZAAR) 100 MG tablet, Take 1 tablet (100 mg total) by mouth daily., Disp: 90 tablet, Rfl: 3 .  Multiple Vitamin (MULTIVITAMIN WITH MINERALS) TABS tablet, Take 1 tablet by mouth daily., Disp: 30 tablet, Rfl: 0 .  polyethylene glycol powder (GLYCOLAX/MIRALAX) 17 GM/SCOOP powder, Take 255 g by mouth as directed. (Patient taking differently: Take 1 Container by mouth daily as needed for mild constipation or moderate constipation. ), Disp: 119 g, Rfl: 0 .  potassium chloride SA (KLOR-CON) 20 MEQ tablet, Take 1.5 tablets (30 mEq total) by mouth daily., Disp: 45 tablet, Rfl: 3 .  rosuvastatin (CRESTOR) 10 MG tablet, Take 1 tablet (10  mg total) by mouth daily., Disp: 90 tablet, Rfl: 3 .  spironolactone (ALDACTONE) 25 MG tablet, TAKE ONE TABLET BY MOUTH ONCE DAILY, Disp: 30 tablet, Rfl: 3 .  gabapentin (NEURONTIN) 300 MG capsule, Take 300 mg by mouth as needed.  (Patient not taking: Reported on 07/10/2020), Disp: , Rfl:  .  ondansetron (ZOFRAN ODT) 8 MG disintegrating tablet, Take 1 tablet (8 mg total) by mouth every 8 (eight) hours as needed for nausea or vomiting. (Patient not taking: Reported on 08/03/2020), Disp: 20 tablet, Rfl: 0 .  tizanidine (ZANAFLEX) 2 MG capsule, Take 2 mg  by mouth 3 (three) times daily as needed for muscle spasms.  (Patient not taking: Reported on 07/10/2020), Disp: , Rfl:  Allergies  Allergen Reactions  . Entresto [Sacubitril-Valsartan] Cough  . Benazepril Cough      Social History   Socioeconomic History  . Marital status: Divorced    Spouse name: Not on file  . Number of children: Not on file  . Years of education: Not on file  . Highest education level: Not on file  Occupational History  . Not on file  Tobacco Use  . Smoking status: Never Smoker  . Smokeless tobacco: Never Used  Vaping Use  . Vaping Use: Never used  Substance and Sexual Activity  . Alcohol use: No  . Drug use: No  . Sexual activity: Yes  Other Topics Concern  . Not on file  Social History Narrative  . Not on file   Social Determinants of Health   Financial Resource Strain:   . Difficulty of Paying Living Expenses: Not on file  Food Insecurity:   . Worried About Charity fundraiser in the Last Year: Not on file  . Ran Out of Food in the Last Year: Not on file  Transportation Needs:   . Lack of Transportation (Medical): Not on file  . Lack of Transportation (Non-Medical): Not on file  Physical Activity:   . Days of Exercise per Week: Not on file  . Minutes of Exercise per Session: Not on file  Stress:   . Feeling of Stress : Not on file  Social Connections:   . Frequency of Communication with Friends and Family: Not on file  . Frequency of Social Gatherings with Friends and Family: Not on file  . Attends Religious Services: Not on file  . Active Member of Clubs or Organizations: Not on file  . Attends Archivist Meetings: Not on file  . Marital Status: Not on file  Intimate Partner Violence:   . Fear of Current or Ex-Partner: Not on file  . Emotionally Abused: Not on file  . Physically Abused: Not on file  . Sexually Abused: Not on file    Physical Exam      Future Appointments  Date Time Provider [place]   08/29/2020  8:40 AM Larey Dresser, MD [facility] None    BP (!) 156/112   Pulse 76   Resp 18   Wt 219 lb (99.3 kg)   BMI 28.89 kg/m   Weight yesterday-220 Last visit weight-220  Pt reports he has been feeling ok, he has not taken his meds yet this morning. He states his breathing has improved.  He has not felt any palpitations anymore. No c/p, no dizziness.  Home b/p readings of 140s/70s Pts pill bottles didn't add up so it appeared he had not been taking them as directed-when asked he said he dumps the new into the old-so  I asked him to be sure to consolidate the old into the new bottle.  Spoke to him about anymore issues or concerns he has that I can assist with. He has none. He has transportation. He gets his meds. I have to go off his word he is taking them, although his BP was up this morning, he had not taken his morn dose of meds yet.  The readings on his auto machine is much improved.  No issues with meds.  Will speak to clinic about d/c from paramedicine as he doesn't feel it is necessary any longer.    Marylouise Stacks, [place] [facility] Paramedic  08/03/20

## 2020-08-09 ENCOUNTER — Telehealth (HOSPITAL_COMMUNITY): Payer: Self-pay

## 2020-08-09 NOTE — Telephone Encounter (Signed)
Patient is now discharged from [facility].  Patient has/has not met the following goals:  Yes :Patient expresses basic understanding of medications and what they are for Yes :Patient able to verbalize heart failure specific dietary/fluid restrictions Yes :Patient is aware of who to call if they have medical concerns or if they need to schedule or change appts Yes :Patient has a scale for daily weights and weighs regularly Yes :Patient able to verbalize concerning symptoms when they should call the HF clinic (weight gain ranges, etc) Yes :Patient has a PCP and has seen within the past year or has upcoming appt Yes :Patient has reliable access to getting their medications Yes :Patient has shown they are able to reorder medications reliably No :Patient has had admission in past 30 days- if yes how many? Yes :Patient has had admission in past 90 days- if yes how many? 1  Discharge Comments:  Pt is agreeable to being d/c. He feels comfortable with managing things from here on out.  He knows if anything changes he can always call myself or contact clinic.   Marylouise Stacks, EMT-Paramedic  08/09/20

## 2020-08-29 ENCOUNTER — Ambulatory Visit (HOSPITAL_COMMUNITY)
Admission: RE | Admit: 2020-08-29 | Discharge: 2020-08-29 | Disposition: A | Discharge status: Home / Self Care | Payer: Medicaid Other | Source: Ambulatory Visit | Attending: Cardiology | Admitting: Cardiology

## 2020-08-29 ENCOUNTER — Other Ambulatory Visit: Payer: Self-pay

## 2020-08-29 ENCOUNTER — Encounter (HOSPITAL_COMMUNITY): Payer: Self-pay | Admitting: Cardiology

## 2020-08-29 VITALS — BP 124/90 | HR 72 | Wt 226.4 lb

## 2020-08-29 DIAGNOSIS — Z7901 Long term (current) use of anticoagulants: Secondary | ICD-10-CM | POA: Insufficient documentation

## 2020-08-29 DIAGNOSIS — Z8249 Family history of ischemic heart disease and other diseases of the circulatory system: Secondary | ICD-10-CM | POA: Diagnosis not present

## 2020-08-29 DIAGNOSIS — I5032 Chronic diastolic (congestive) heart failure: Secondary | ICD-10-CM | POA: Diagnosis not present

## 2020-08-29 DIAGNOSIS — I110 Hypertensive heart disease with heart failure: Secondary | ICD-10-CM | POA: Insufficient documentation

## 2020-08-29 DIAGNOSIS — Z9114 Patient's other noncompliance with medication regimen: Secondary | ICD-10-CM | POA: Diagnosis not present

## 2020-08-29 DIAGNOSIS — I493 Ventricular premature depolarization: Secondary | ICD-10-CM

## 2020-08-29 DIAGNOSIS — I428 Other cardiomyopathies: Secondary | ICD-10-CM | POA: Diagnosis not present

## 2020-08-29 DIAGNOSIS — I483 Typical atrial flutter: Secondary | ICD-10-CM

## 2020-08-29 DIAGNOSIS — I4892 Unspecified atrial flutter: Secondary | ICD-10-CM | POA: Insufficient documentation

## 2020-08-29 DIAGNOSIS — I2510 Atherosclerotic heart disease of native coronary artery without angina pectoris: Secondary | ICD-10-CM | POA: Insufficient documentation

## 2020-08-29 DIAGNOSIS — Z79899 Other long term (current) drug therapy: Secondary | ICD-10-CM | POA: Insufficient documentation

## 2020-08-29 DIAGNOSIS — I340 Nonrheumatic mitral (valve) insufficiency: Secondary | ICD-10-CM | POA: Diagnosis not present

## 2020-08-29 DIAGNOSIS — I5042 Chronic combined systolic (congestive) and diastolic (congestive) heart failure: Secondary | ICD-10-CM

## 2020-08-29 LAB — BASIC METABOLIC PANEL
Anion gap: 12 (ref 5–15)
BUN: 13 mg/dL (ref 6–20)
CO2: 21 mmol/L — ABNORMAL LOW (ref 22–32)
Calcium: 9.3 mg/dL (ref 8.9–10.3)
Chloride: 104 mmol/L (ref 98–111)
Creatinine, Ser: 1.3 mg/dL — ABNORMAL HIGH (ref 0.61–1.24)
GFR, Estimated: >60 mL/min (ref >60)
Glucose, Bld: 96 mg/dL (ref 70–99)
Potassium: 4.8 mmol/L (ref 3.5–5.1)
Sodium: 137 mmol/L (ref 135–145)

## 2020-08-29 LAB — CBC
HCT: 44.9 % (ref 39.0–52.0)
Hemoglobin: 15.2 g/dL (ref 13.0–17.0)
MCH: 31.1 pg (ref 26.0–34.0)
MCHC: 33.9 g/dL (ref 30.0–36.0)
MCV: 92 fL (ref 80.0–100.0)
Platelets: 127 10*3/uL — ABNORMAL LOW (ref 150–400)
RBC: 4.88 MIL/uL (ref 4.22–5.81)
RDW: 13.3 % (ref 11.5–15.5)
WBC: 3.8 10*3/uL — ABNORMAL LOW (ref 4.0–10.5)
nRBC: 0 % (ref 0.0–0.2)

## 2020-08-29 MED ORDER — HYDRALAZINE HCL 50 MG PO TABS: 75.0000 mg | ORAL_TABLET | Freq: Three times a day (TID) | ORAL | 4 refills | Status: DC

## 2020-08-29 NOTE — Progress Notes (Signed)
Zio patch placed onto patient.  All instructions and information reviewed with patient, they verbalize understanding with no questions. 

## 2020-08-29 NOTE — Patient Instructions (Signed)
Increase Hydralazine to 75 mg 3 times a day  Your provider has recommended that  you wear a Zio Patch for 14 days.  This monitor will record your heart rhythm for our review.  IF you have any symptoms while wearing the monitor please press the button.  If you have any issues with the patch or you notice a red or orange light on it please call the company at [phone].  Once you remove the patch please mail it back to the company as soon as possible so we can get the results.  Labs done today, your results will be available in MyChart, we will contact you for abnormal readings.  Your physician has requested that you have a cardiac MRI. Cardiac MRI uses a computer to create images of your heart as its beating, producing both still and moving pictures of your heart and major blood vessels. For further information please visit [email or web address] Please follow the instruction sheet given to you today for more information.  Dr. Geraldine Solar Cardiac PET scan at [facility]. We will call you when insurance authorization is done.  Your physician recommends that you schedule a follow-up appointment in: 2 months

## 2020-08-29 NOTE — Progress Notes (Signed)
PCP: [name], MD Cardiology: Dr. [name] HF Cardiology: Dr. [name]  59 y.o. 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 [name] for evaluation of CHF. 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  Cardiolite in 5/16 showed no ischemia but possible apical infarction.  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  PYP scan was read as equivocal in 5/20 but probably not suggestive of transthyretin amyloidosis.   TEE was done in 6/20 to assess severity of MR.  EF was 50% with mild mitral valve prolapse and moderate MR.  RHC/LHC showed nonobstructive CAD, pulmonary venous hypertension, relatively controlled filling pressures. 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   Echo in 3/21 showed EF 55-60%, normal RV, severe LAE, moderate-severe MR.    Patient's BP has been poorly controlled over time.  He has not been consistent with medication compliance.  Renal artery dopplers were unremarkable in 6/16.   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   He had atrial flutter in 9/21 but has been back in NSR.   Patient returns for followup of CHF.  No palpitations.  He is tired after walking 5-6 blocks.  No chest pain.  BRBPR/melena.  BP is controlled today but tends to run 140s/90s at home. He is off Entresto due to cough.   ECG (personally reviewed): NSR, LVH  Patient presents for followup of CHF.  BP is controlled.  Weight is stable.   Labs (5/20): K 3.5, creatinine 1.23 Labs (6/20): Urine immunofixation negative, K 4.2, creatinine 1.36 Labs (7/20): creatinine 1.43 Labs (9/20): uric acid 7.5, LDL 67 Labs (1/21):  K 3.9, creatinine 1.33, BNP 388, ACE level low Labs (5/21): LDL 57 Labs (9/21): BNP 804, K 4.1, creatinine 1.42 Labs (12/21): K 4.8, creatinine 1.3  PMH: 1. Gout 2. H/o NSVT/PVCs 3. 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  Seen on multiple echoes, first in 2015.  4. HTN: Renal artery dopplers in 6/16 were unremarkable. Sleep study about 2 years ago was negative per patient's report.  5. Mitral regurgitation: TEE in 2015 with mild to moderate MR.  - Echo (2/20): Moderate MR.  - Echo (5/20): Possible severe MR - TEE (6/20): Mild mitral valve prolapse with moderate MR.  - Echo (3/21): Moderate to severe MR.  - TEE (9/21): moderate-severe MR with prolapse of A2 (eccentric MR).  6. 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  - Cardiolite (5/16): Possible apical infarct, no ischemia.  - Echo (7/19): EF 50-55%.  - Echo (2/20): EF 50-55%, moderate MR, TV fibroelastoma.  - PYP scan (5/20): Grade 1 visually, H/CL 1.23.  Read as equivocal but likely negative for TTR amyloidosis.  - Echo (5/20): EF 40-45%, mildly dilated LV with mild LVH, normal RV size and systolic function, possible severe MR, moderate TR, 1 cm nodule on TV may be fibroelastoma, PASP 79 mmHg.  - TEE (6/20): EF 50%, mild LV dilation, mild LVH, moderate LAE, tricuspid valve mass likely fibroelastoma, mild MVP with moderate MR.  - LHC/RHC (6/20): 50% LAD, 30% pRCA; mean RA 4, PA 55/21 mean 34, mean PCWP 17, CI 2.66, PVR 2.8 WU.  -  Cardiac MRI (8/20): EF 53%, mild LVH, inferoseptal/inferior mid-wall LGE (?cardiac sarcoidosis, does not look like amyloidosis), mild RV dilation with RVEF 54%, moderate eccentric MR, severe LAE. - Echo (3/21):  EF 55-60%, normal RV, severe LAE, moderate-severe MR. 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  7. Atrial flutter:  Noted first in 9/21.   FH:  Father with MI at 59, brother with MI at 24, mother with CHF, MI.  HTN in multiple family members.   Social History   Socioeconomic History  . Marital status: Divorced    Spouse name: Not on file  . Number of children: Not on file  . Years of education: Not on file  . Highest education level: Not on file  Occupational History  . Not on file  Tobacco Use  . Smoking status: Never Smoker  . Smokeless tobacco: Never Used  Vaping Use  . Vaping Use: Never used  Substance and Sexual Activity  . Alcohol use: No  . Drug use: No  . Sexual activity: Yes  Other Topics Concern  . Not on file  Social History Narrative  . Not on file   Social Determinants of Health   Financial Resource Strain: Not on file  Food Insecurity: Not on file  Transportation Needs: Not on file  Physical Activity: Not on file  Stress: Not on file  Social Connections: Not on file  Intimate Partner Violence: Not on file   ROS: All systems reviewed and negative except as per HPI.   Current Outpatient Medications  Medication Sig Dispense Refill  . acetaminophen (TYLENOL) 650 MG CR tablet Take 1,300 mg by mouth every 8 (eight) hours as needed for pain.    Marland Kitchen allopurinol (ZYLOPRIM) 100 MG tablet Take 1 tablet (100 mg total) by mouth 2 (two) times daily. 60 tablet 6  . amLODipine (NORVASC) 10 MG tablet Take 10 mg by mouth daily.    Marland Kitchen apixaban (ELIQUIS) 5 MG TABS tablet Take 1 tablet (5 mg total) by mouth 2 (two) times daily. 60 tablet 3  . bisacodyl (DULCOLAX) 5 MG EC tablet Take 1 tablet (5 mg total) by mouth daily as needed for moderate constipation. 4 tablet 0  . carvedilol (COREG) 25 MG tablet Take 1 tablet (25 mg total) by mouth 2 (two) times daily. 180 tablet 3  . cetirizine (ZYRTEC) 10 MG tablet Take 10 mg by mouth daily as needed for allergies.    Marland Kitchen docusate sodium (COLACE) 100 MG capsule Take 100 mg by mouth daily as needed for mild constipation.    . Ensure Max Protein (ENSURE  MAX PROTEIN) LIQD Take 330 mLs (11 oz total) by mouth daily. 330 mL 3  . furosemide (LASIX) 20 MG tablet Take 2 tablets (40 mg total) by mouth daily. 60 tablet 3  . gabapentin (NEURONTIN) 300 MG capsule Take 300 mg by mouth as needed.    . isosorbide mononitrate (IMDUR) 60 MG 24 hr tablet Take 60 mg by mouth daily.    Marland Kitchen losartan (COZAAR) 100 MG tablet Take 1 tablet (100 mg total) by mouth daily. 90 tablet 3  . Multiple Vitamin (MULTIVITAMIN WITH MINERALS) TABS tablet Take 1 tablet by mouth daily. 30 tablet 0  . ondansetron (ZOFRAN ODT) 8 MG disintegrating tablet Take 1 tablet (8 mg total) by mouth every 8 (eight) hours as needed for nausea or vomiting. 20 tablet 0  . polyethylene glycol  powder (MIRALAX) 17 GM/SCOOP powder Take 1 Container by mouth daily as needed for mild constipation, moderate constipation or severe constipation.    . potassium chloride SA (KLOR-CON) 20 MEQ tablet Take 1.5 tablets (30 mEq total) by mouth daily. 45 tablet 3  . rosuvastatin (CRESTOR) 10 MG tablet Take 1 tablet (10 mg total) by mouth daily. 90 tablet 3  . spironolactone (ALDACTONE) 25 MG tablet TAKE ONE TABLET BY MOUTH ONCE DAILY 30 tablet 3  . tizanidine (ZANAFLEX) 2 MG capsule Take 2 mg by mouth 3 (three) times daily as needed for muscle spasms.    . hydrALAZINE (APRESOLINE) 50 MG tablet Take 1.5 tablets (75 mg total) by mouth 3 (three) times daily. 90 tablet 4   No current facility-administered medications for this encounter.   BP 124/90   Pulse 72   Wt 102.7 kg (226 lb 6.4 oz)   SpO2 98%   BMI 29.87 kg/m  General: NAD Neck: No JVD, no thyromegaly or thyroid nodule.  Lungs: Clear to auscultation bilaterally with normal respiratory effort. CV: Nondisplaced PMI.  Heart regular S1/S2, no S3/S4, 3/6 HSM apex.  No peripheral edema.  No carotid bruit.  Normal pedal pulses.  Abdomen: Soft, nontender, no hepatosplenomegaly, no distention.  Skin: Intact without lesions or rashes.  Neurologic: Alert and oriented x  3.  Psych: Normal affect. Extremities: No clubbing or cyanosis.  HEENT: Normal.   Assessment/Plan: 1. HTN: Negative renal artery dopplers and sleep study in past.  On multiple agents. Historically poorly controlled, think medication noncompliance plays a large role.   - Unable to tolerate Entresto due to cough, continue losartan 100 mg daily.  - Continue amlodipine 10 mg daily.  - Increase hydralazine to 75 mg tid, continue Imdur 60 daily.  - Continue spironolactone 25 mg daily.  - Continue Coreg 25 mg bid.  2. 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  Echo in 5/20 showed EF 40-45% with normal RV, possible severe mitral regurgitation, and severe pulmonary hypertension.  PYP scan in 5/20 was read as equivocal, but I suspect this is not suggestive of transthyretin amyloidosis. TEE in 5/20 showed EF 50%, mild LV dilation, moderate MR.  RHC/LHC showed mildly elevated PCWP, pulmonary venous hypertension, and nonobstructive CAD.  Cardiac MRI in 8/20 showed EF 53% and had an LGE pattern that was suggestive of possible cardiac sarcoidosis (not amyloidosis).  Echo in 3/21 showed EF up to 55-60%.  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  He is not volume overloaded on exam, NYHA class II symptoms.  - Continue Lasix 40 mg daily with BMET today.  - Continue losartan 100 mg daily.   - Continue Coreg 25 mg bid and spironolactone 25 mg daily.  - Given concern for possible cardiac sarcoidosis, I will arrange a cardiac PET to be done at [facility] to assess for cardiac sarcoidosis.  I will also arrange for 2 week Zio patch to look for ventricular arrhythmias or transient heart block given concern for possible cardiac sarcoidosis.  3. Mitral regurgitation: ?severe on 5/20 TTE but TEE in 6/20 showed mild mitral valve prolapse with only moderate MR.   Cardiac MRI also suggested moderate MR. TTE in 3/21 suggested moderate-severe MR. TEE in 9/21 with moderate-severe MR, A2 prolapse.  He has a prominent murmur on exam.  - May be eventual candidate for MV repair.  - Repeat cardiac  MRI to quantify mitral regurgitant fraction.   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  5. 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  - continue Crestor, good lipids in 5/21.  6. Atrial flutter: In NSR today.    - Continue Eliquis 5 mg bid, CBC today.  - I will arrange for TEE-guided DCCV next week as above.  - Consider atrial flutter ablation if flutter returns.  Followup 2 months  Loralie Champagne 08/29/2020

## 2020-09-04 ENCOUNTER — Telehealth (HOSPITAL_COMMUNITY): Payer: Self-pay | Admitting: *Deleted

## 2020-09-04 NOTE — Telephone Encounter (Signed)
PET SCAN precert in clinical review.   Physician Name: Dr. 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

## 2020-09-16 ENCOUNTER — Encounter (HOSPITAL_COMMUNITY): Payer: Self-pay | Admitting: Emergency Medicine

## 2020-09-16 ENCOUNTER — Emergency Department (HOSPITAL_COMMUNITY): Payer: Medicaid Other

## 2020-09-16 ENCOUNTER — Emergency Department (HOSPITAL_COMMUNITY)
Admission: EM | Admit: 2020-09-16 | Discharge: 2020-09-16 | Disposition: A | Discharge status: Home / Self Care | Payer: Medicaid Other | Attending: Emergency Medicine | Admitting: Emergency Medicine

## 2020-09-16 ENCOUNTER — Other Ambulatory Visit: Payer: Self-pay

## 2020-09-16 DIAGNOSIS — N183 Chronic kidney disease, stage 3 unspecified: Secondary | ICD-10-CM | POA: Insufficient documentation

## 2020-09-16 DIAGNOSIS — I1310 Hypertensive heart and chronic kidney disease without heart failure, with stage 1 through stage 4 chronic kidney disease, or unspecified chronic kidney disease: Secondary | ICD-10-CM | POA: Diagnosis not present

## 2020-09-16 DIAGNOSIS — R197 Diarrhea, unspecified: Secondary | ICD-10-CM | POA: Diagnosis not present

## 2020-09-16 DIAGNOSIS — Z79899 Other long term (current) drug therapy: Secondary | ICD-10-CM | POA: Diagnosis not present

## 2020-09-16 DIAGNOSIS — Z7901 Long term (current) use of anticoagulants: Secondary | ICD-10-CM | POA: Diagnosis not present

## 2020-09-16 DIAGNOSIS — R1032 Left lower quadrant pain: Secondary | ICD-10-CM

## 2020-09-16 DIAGNOSIS — I5042 Chronic combined systolic (congestive) and diastolic (congestive) heart failure: Secondary | ICD-10-CM | POA: Diagnosis not present

## 2020-09-16 DIAGNOSIS — R109 Unspecified abdominal pain: Secondary | ICD-10-CM | POA: Diagnosis not present

## 2020-09-16 DIAGNOSIS — R52 Pain, unspecified: Secondary | ICD-10-CM | POA: Diagnosis not present

## 2020-09-16 DIAGNOSIS — I10 Essential (primary) hypertension: Secondary | ICD-10-CM | POA: Diagnosis not present

## 2020-09-16 LAB — COMPREHENSIVE METABOLIC PANEL
ALT: 22 U/L (ref 0–44)
AST: 24 U/L (ref 15–41)
Albumin: 4.2 g/dL (ref 3.5–5.0)
Alkaline Phosphatase: 58 U/L (ref 38–126)
Anion gap: 11 (ref 5–15)
BUN: 14 mg/dL (ref 6–20)
CO2: 16 mmol/L — ABNORMAL LOW (ref 22–32)
Calcium: 9.1 mg/dL (ref 8.9–10.3)
Chloride: 103 mmol/L (ref 98–111)
Creatinine, Ser: 1.13 mg/dL (ref 0.61–1.24)
GFR, Estimated: >60 mL/min (ref >60)
Glucose, Bld: 97 mg/dL (ref 70–99)
Potassium: 3.7 mmol/L (ref 3.5–5.1)
Sodium: 130 mmol/L — ABNORMAL LOW (ref 135–145)
Total Bilirubin: 0.7 mg/dL (ref 0.3–1.2)
Total Protein: 7.5 g/dL (ref 6.5–8.1)

## 2020-09-16 LAB — URINALYSIS, ROUTINE W REFLEX MICROSCOPIC
Bilirubin Urine: NEGATIVE
Glucose, UA: NEGATIVE mg/dL
Hgb urine dipstick: NEGATIVE
Ketones, ur: NEGATIVE mg/dL
Leukocytes,Ua: NEGATIVE
Nitrite: NEGATIVE
Protein, ur: NEGATIVE mg/dL
Specific Gravity, Urine: 1.003 — ABNORMAL LOW (ref 1.005–1.030)
pH: 5 (ref 5.0–8.0)

## 2020-09-16 LAB — CBC
HCT: 44.5 % (ref 39.0–52.0)
Hemoglobin: 15.2 g/dL (ref 13.0–17.0)
MCH: 31.8 pg (ref 26.0–34.0)
MCHC: 34.2 g/dL (ref 30.0–36.0)
MCV: 93.1 fL (ref 80.0–100.0)
Platelets: 110 10*3/uL — ABNORMAL LOW (ref 150–400)
RBC: 4.78 MIL/uL (ref 4.22–5.81)
RDW: 13.3 % (ref 11.5–15.5)
WBC: 4.1 10*3/uL (ref 4.0–10.5)
nRBC: 0 % (ref 0.0–0.2)

## 2020-09-16 LAB — LIPASE, BLOOD: Lipase: 63 U/L — ABNORMAL HIGH (ref 11–51)

## 2020-09-16 MED ORDER — ONDANSETRON HCL 4 MG/2ML IJ SOLN
4.0000 mg | Freq: Once | INTRAMUSCULAR | Status: AC
  Administered 2020-09-16: 4 mg via INTRAVENOUS
  Filled 2020-09-16: qty 2

## 2020-09-16 MED ORDER — DICYCLOMINE HCL 20 MG PO TABS: 20.0000 mg | ORAL_TABLET | Freq: Two times a day (BID) | ORAL | 0 refills | Status: DC

## 2020-09-16 MED ORDER — MORPHINE SULFATE (PF) 4 MG/ML IV SOLN
4.0000 mg | Freq: Once | INTRAVENOUS | Status: AC
  Administered 2020-09-16: 4 mg via INTRAVENOUS
  Filled 2020-09-16: qty 1

## 2020-09-16 MED ORDER — IOHEXOL 300 MG/ML  SOLN
100.0000 mL | Freq: Once | INTRAMUSCULAR | Status: AC | PRN
  Administered 2020-09-16: 100 mL via INTRAVENOUS

## 2020-09-16 NOTE — Discharge Instructions (Signed)
Take the medication as prescribed.  Return for new or worsening symptoms. 

## 2020-09-16 NOTE — ED Provider Notes (Signed)
[facility]-EMERGENCY DEPT Provider Note   CSN: [id number] Arrival date & time: 09/16/20  0046    History Chief Complaint  Patient presents with  . Abdominal Pain   Daniel Joseph is a 60 y.o. male with past medical history significant for hypertension, dilated, nonischemic cardiomyopathy the, CHF, mitral regurg, frequent PVCs, CKD who presents for evaluation of left flank pain.  Stated began yesterday.  Intermittent in nature.  No dysuria, hematuria.  No history of stones.  He denies any history of AAA or dissection.  Has had 2 days of diarrhea without melena or bright blood per rectum.  He rates his current pain a 6/10.  He has not taken anything for his pain.  Denies fever, chills, nausea, vomiting, chest pain, shortness of breath, suprapubic pain, leg swelling, lateral redness, warmth.  Denies any PND orthopnea.  States his heart failure has "been good."  He is unsure if he has a history of diverticulitis. Denies additional aggravating or relieving factors.  History obtained from patient and past medical records. No interpretor was used.  HPI    Past Medical History:  Diagnosis Date  . Benign essential HTN 01/19/2014   Renal Artery [place] 6/16:  No RAS, No AAA  . Cardiomyopathy, dilated, nonischemic (HCC)    EF 45-50% bye echo 08/2015 and EF 50-55% by echo 10/2017  . Chronic combined systolic and diastolic CHF (congestive heart failure) (HCC) 01/21/2014  . 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 Gout   . Mitral valve regurgitation    mild to moderate MR with MVP of ANMVL by echo 2019  . PVC's (premature ventricular contractions)    nonsustained VT as well as PVCs - no ICD indicated due to EF 40-45%  . Sleep apnea    pending second sleep study   . 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 Neuropathy 02/02/2020  . Nausea 02/02/2020  . Acute exacerbation of CHF (congestive heart failure) (HCC) 08/29/2019  . Alcohol abuse 08/29/2019  . Hypoxic Respiratory failure, acute (HCC) 08/29/2019  . PVC's (premature ventricular contractions)   . HTN (hypertension) 03/02/2014  . Chronic combined systolic and diastolic CHF (congestive heart failure) (HCC) 03/02/2014  . Hypertensive cardiomyopathy (HCC) 03/02/2014  . CKD (chronic kidney disease), stage III (HCC) 02/15/2014  . Neck nodule 02/01/2014  . Eye redness 02/01/2014  . Mitral valve regurgitation 01/21/2014  . 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    Past Surgical History:  Procedure Laterality Date  . ABDOMINAL SURGERY  at birth  . BUBBLE STUDY  03/16/2019   Procedure: BUBBLE STUDY;  Surgeon: Laurey Morale, MD;  Location: [facility] ENDOSCOPY;  Service: Cardiovascular;;  . CARDIOVERSION N/A 06/08/2020   Procedure: CARDIOVERSION;  Surgeon: Laurey Morale, MD;  Location: [facility] ENDOSCOPY;  Service: Cardiovascular;  Laterality: N/A;  . RIGHT/LEFT HEART CATH AND CORONARY ANGIOGRAPHY N/A 03/16/2019   Procedure: RIGHT/LEFT HEART CATH AND CORONARY ANGIOGRAPHY;  Surgeon: Laurey Morale, MD;  Location: [facility] INVASIVE CV LAB;  Service: Cardiovascular;  Laterality: N/A;  . TEE WITHOUT CARDIOVERSION N/A 01/25/2014   Procedure: TRANSESOPHAGEAL ECHOCARDIOGRAM (TEE);  Surgeon: Vesta Mixer, MD;  Location: [facility] ENDOSCOPY;  Service: Cardiovascular;  Laterality: N/A;  . TEE WITHOUT CARDIOVERSION N/A 09/14/2014  Procedure: TRANSESOPHAGEAL ECHOCARDIOGRAM (TEE);  Surgeon: Sueanne Margarita, MD;  Location: [place];  Service: Cardiovascular;  Laterality: N/A;  . TEE WITHOUT CARDIOVERSION N/A 03/16/2019   Procedure: TRANSESOPHAGEAL ECHOCARDIOGRAM (TEE);  Surgeon: Larey Dresser, MD;  Location: [facility] ENDOSCOPY;  Service: Cardiovascular;  Laterality: N/A;  . TEE WITHOUT CARDIOVERSION N/A 06/08/2020   Procedure:  TRANSESOPHAGEAL ECHOCARDIOGRAM (TEE);  Surgeon: Larey Dresser, MD;  Location: [facility] ENDOSCOPY;  Service: Cardiovascular;  Laterality: N/A;       Family History  Problem Relation Age of Onset  . Heart Problems Mother   . Stroke Mother   . Heart failure Mother   . Hypertension Mother   . Heart Problems Father   . Heart attack Father   . Hypertension Father   . Heart Problems Brother   . Heart attack Brother   . Hypertension Brother   . Colon cancer Neg Hx   . Esophageal cancer Neg Hx   . Stomach cancer Neg Hx   . Colon polyps Neg Hx   . Rectal cancer Neg Hx     Social History   Tobacco Use  . Smoking status: Never Smoker  . Smokeless tobacco: Never Used  Vaping Use  . Vaping Use: Never used  Substance Use Topics  . Alcohol use: No  . Drug use: No    Home Medications Prior to Admission medications   Medication Sig Start Date End Date Taking? Authorizing Provider  acetaminophen (TYLENOL) 650 MG CR tablet Take 1,300 mg by mouth every 8 (eight) hours as needed for pain.   Yes [provider]  allopurinol (ZYLOPRIM) 100 MG tablet Take 1 tablet (100 mg total) by mouth 2 (two) times daily. 02/01/20  Yes Azzie Glatter, FNP  amLODipine (NORVASC) 10 MG tablet Take 10 mg by mouth daily.   Yes [provider]  apixaban (ELIQUIS) 5 MG TABS tablet Take 1 tablet (5 mg total) by mouth 2 (two) times daily. 05/30/20  Yes Larey Dresser, MD  bisacodyl (DULCOLAX) 5 MG EC tablet Take 1 tablet (5 mg total) by mouth daily as needed for moderate constipation. 03/23/19  Yes Danis, Kirke Corin, MD  carvedilol (COREG) 25 MG tablet Take 1 tablet (25 mg total) by mouth 2 (two) times daily. 03/22/20  Yes Larey Dresser, MD  dicyclomine (BENTYL) 20 MG tablet Take 1 tablet (20 mg total) by mouth 2 (two) times daily. 09/16/20  Yes Yeraldi Fidler A, PA-C  docusate sodium (COLACE) 100 MG capsule Take 100 mg by mouth daily as needed for mild constipation.   Yes [provider]   Ensure Max Protein (ENSURE MAX PROTEIN) LIQD Take 330 mLs (11 oz total) by mouth daily. 09/01/19  Yes Sheth, Devam P, MD  furosemide (LASIX) 20 MG tablet Take 2 tablets (40 mg total) by mouth daily. 05/24/20  Yes Larey Dresser, MD  gabapentin (NEURONTIN) 300 MG capsule Take 300 mg by mouth daily as needed (nerve pain).   Yes [provider]  hydrALAZINE (APRESOLINE) 50 MG tablet Take 1.5 tablets (75 mg total) by mouth 3 (three) times daily. 08/29/20  Yes Larey Dresser, MD  isosorbide mononitrate (IMDUR) 60 MG 24 hr tablet Take 60 mg by mouth daily.   Yes [provider]  losartan (COZAAR) 100 MG tablet Take 1 tablet (100 mg total) by mouth daily. 06/12/20  Yes Larey Dresser, MD  Multiple Vitamin (MULTIVITAMIN WITH MINERALS) TABS tablet Take 1 tablet by mouth daily. 09/02/19  Yes  Sheth, Devam P, MD  polyethylene glycol powder (GLYCOLAX/MIRALAX) 17 GM/SCOOP powder Take 1 Container by mouth daily as needed for mild constipation, moderate constipation or severe constipation.   Yes [provider]  potassium chloride SA (KLOR-CON) 20 MEQ tablet Take 1.5 tablets (30 mEq total) by mouth daily. 05/24/20  Yes Laurey Morale, MD  rosuvastatin (CRESTOR) 10 MG tablet Take 1 tablet (10 mg total) by mouth daily. 03/09/20  Yes Laurey Morale, MD  spironolactone (ALDACTONE) 25 MG tablet TAKE ONE TABLET BY MOUTH ONCE DAILY 07/04/20  Yes Laurey Morale, MD  cetirizine (ZYRTEC) 10 MG tablet Take 10 mg by mouth daily as needed for allergies.    [provider]  ondansetron (ZOFRAN ODT) 8 MG disintegrating tablet Take 1 tablet (8 mg total) by mouth every 8 (eight) hours as needed for nausea or vomiting. Patient not taking: Reported on 09/16/2020 03/04/19   Mike Gip, FNP  tizanidine (ZANAFLEX) 2 MG capsule Take 2 mg by mouth 3 (three) times daily as needed for muscle spasms.    [provider]    Allergies    Entresto [sacubitril-valsartan] and  Benazepril  Review of Systems   Review of Systems  Constitutional: Negative.   HENT: Negative.   Respiratory: Negative.   Cardiovascular: Negative.   Gastrointestinal: Positive for abdominal pain and diarrhea. Negative for abdominal distention, anal bleeding, blood in stool, constipation, nausea, rectal pain and vomiting.  Genitourinary: Positive for flank pain. Negative for decreased urine volume, difficulty urinating, dysuria, enuresis, frequency, genital sores, hematuria, penile discharge, penile pain, penile swelling, scrotal swelling, testicular pain and urgency.  Neurological: Negative.   All other systems reviewed and are negative.  Physical Exam Updated Vital Signs BP 138/82   Pulse 69   Temp 97.7 F (36.5 C) (Oral)   Resp 19   SpO2 97%   Physical Exam Vitals and nursing note reviewed.  Constitutional:      General: He is not in acute distress.    Appearance: He is well-developed and well-nourished. He is not ill-appearing, toxic-appearing or diaphoretic.  HENT:     Head: Normocephalic and atraumatic.     Jaw: There is normal jaw occlusion.     Nose: Nose normal.     Mouth/Throat:     Mouth: Mucous membranes are moist.  Eyes:     Pupils: Pupils are equal, round, and reactive to light.  Neck:     Trachea: Trachea and phonation normal.  Cardiovascular:     Rate and Rhythm: Normal rate and regular rhythm.     Pulses: Normal pulses.          Radial pulses are 2+ on the right side and 2+ on the left side.       Dorsalis pedis pulses are 2+ on the right side and 2+ on the left side.       Posterior tibial pulses are 2+ on the right side and 2+ on the left side.     Heart sounds: Normal heart sounds.  Pulmonary:     Effort: Pulmonary effort is normal. No respiratory distress.     Breath sounds: Normal breath sounds and air entry.     Comments: Clear to auscultation bilaterally. Speaks in full sentences without difficulty  Chest:     Comments: Equal rise and fall to  chest wall Abdominal:     General: Bowel sounds are normal. There is no distension.     Palpations: Abdomen is soft.  Tenderness: There is abdominal tenderness. There is no right CVA tenderness, guarding or rebound. Negative signs include Murphy's sign and McBurney's sign.     Hernia: No hernia is present.     Comments: Soft, mild tenderness to left flank, lower back and minimal tenderness to LLQ. No rebound or guarding. Old abd wall surgical scars.  Musculoskeletal:        General: Normal range of motion.     Cervical back: Normal range of motion and neck supple.     Comments: Moves all 4 extremities without difficulty. Compartments soft. No bony tenderness.  Skin:    General: Skin is warm and dry.     Capillary Refill: Capillary refill takes less than 2 seconds.  Neurological:     General: No focal deficit present.     Mental Status: He is alert.  Psychiatric:        Mood and Affect: Mood and affect normal.    ED Results / Procedures / Treatments   Labs (all labs ordered are listed, but only abnormal results are displayed) Labs Reviewed  LIPASE, BLOOD - Abnormal; Notable for the following components:      Result Value   Lipase 63 (*)    All other components within normal limits  COMPREHENSIVE METABOLIC PANEL - Abnormal; Notable for the following components:   Sodium 130 (*)    CO2 16 (*)    All other components within normal limits  CBC - Abnormal; Notable for the following components:   Platelets 110 (*)    All other components within normal limits  URINALYSIS, ROUTINE W REFLEX MICROSCOPIC - Abnormal; Notable for the following components:   Color, Urine STRAW (*)    Specific Gravity, Urine 1.003 (*)    All other components within normal limits    EKG None  Radiology CT Abdomen Pelvis W Contrast  Result Date: 09/16/2020 CLINICAL DATA:  Acute left-sided abdominal pain. EXAM: CT ABDOMEN AND PELVIS WITH CONTRAST TECHNIQUE: Multidetector CT imaging of the abdomen and  pelvis was performed using the standard protocol following bolus administration of intravenous contrast. CONTRAST:  122mL OMNIPAQUE IOHEXOL 300 MG/ML  SOLN COMPARISON:  April 18, 2011. FINDINGS: Lower chest: No acute abnormality. Hepatobiliary: No focal liver abnormality is seen. No gallstones, gallbladder wall thickening, or biliary dilatation. Pancreas: Unremarkable. No pancreatic ductal dilatation or surrounding inflammatory changes. Spleen: Normal in size without focal abnormality. Adrenals/Urinary Tract: Adrenal glands are unremarkable. Kidneys are normal, without renal calculi, focal lesion, or hydronephrosis. Bladder is unremarkable. Stomach/Bowel: Stomach appears normal. There is no evidence of bowel obstruction or inflammation. The appendix is not visualized, but no inflammation is noted in the right lower quadrant. Vascular/Lymphatic: No significant vascular findings are present. No enlarged abdominal or pelvic lymph nodes. Reproductive: Prostate is unremarkable. Other: No abdominal wall hernia or abnormality. No abdominopelvic ascites. Musculoskeletal: No acute or significant osseous findings. IMPRESSION: No definite abnormality seen in the abdomen or pelvis. Electronically Signed   By: Marijo Conception M.D.   On: 09/16/2020 14:50    Procedures Procedures (including critical care time)  Medications Ordered in ED Medications  ondansetron (ZOFRAN) injection 4 mg (4 mg Intravenous Given 09/16/20 1348)  morphine 4 MG/ML injection 4 mg (4 mg Intravenous Given 09/16/20 1348)  iohexol (OMNIPAQUE) 300 MG/ML solution 100 mL (100 mLs Intravenous Contrast Given 09/16/20 1430)    ED Course  I have reviewed the triage vital signs and the nursing notes.  Pertinent labs & imaging results that were available during  my care of the patient were reviewed by me and considered in my medical decision making (see chart for details).  Unfortunately patient had greater than 11-hour wait in the waiting room.  Last  Echo 11/2019 with EF 30-52%   60 year old male presents for evaluation of left flank pain.  Intermittent, began yesterday.  No history of kidney stones.  He denies any history of AAA or dissections.  His heart and lungs are clear.  His abdomen does have some mild tenderness to his left lower quadrant however no rebound or guarding.  Does have some generalized tenderness to his left flank, lower back.  He has no radicular symptoms.  No history IV drug use, bowel or bladder incontinence, saddle paresthesia.  No overlying skin changes to his abdominal wall.    Did have labs started from [place].  Labs and imaging personally reviewed and interpreted:  CBC without leukocytosis, platelets at 110 similar to baseline Metabolic panel mild hyponatremia to 130, CO2 16, no additional electrolyte, renal or liver abnormality. Lipase 63, denies epigastric pain, chronic NSAID use, EtOH use, history of pancreatitis UA negative for infection CT AP without acute changes.  Patient reassessed.  States his pain resolved with 1 dose of medication.  He is tolerating p.o. intake without difficulty.  Patient appears overall well.  Unclear etiology of his symptoms.    Patient is nontoxic, nonseptic appearing, in no apparent distress.  Patient's pain and other symptoms adequately managed in emergency department.  Fluid bolus given.  Labs, imaging and vitals reviewed.  Patient does not meet the SIRS or Sepsis criteria.  On repeat exam patient does not have a surgical abdomin and there are no peritoneal signs.  No indication of appendicitis, bowel obstruction, bowel perforation, cholecystitis, diverticulitis, AAA, dissection, back pain with radiation, neurosurgical emergency. Patient NV intact, low suspicion for vascular etiology of symptoms.  The patient has been appropriately medically screened and/or stabilized in the ED. I have low suspicion for any other emergent medical condition which would require further screening, evaluation  or treatment in the ED or require inpatient management.  Patient is hemodynamically stable and in no acute distress.  Patient able to ambulate in department prior to ED.  Evaluation does not show acute pathology that would require ongoing or additional emergent interventions while in the emergency department or further inpatient treatment.  I have discussed the diagnosis with the patient and answered all questions.  Pain is been managed while in the emergency department and patient has no further complaints prior to discharge.  Patient is comfortable with plan discussed in room and is stable for discharge at this time.  I have discussed strict return precautions for returning to the emergency department.  Patient was encouraged to follow-up with PCP/specialist refer to at discharge.     MDM Rules/Calculators/A&P                           Final Clinical Impression(s) / ED Diagnoses Final diagnoses:  Left lower quadrant abdominal pain    Rx / DC Orders ED Discharge Orders         Ordered    dicyclomine (BENTYL) 20 MG tablet  2 times daily        09/16/20 1602           Gayl Ivanoff A, PA-C 09/16/20 1603    Lucrezia Starch, MD 09/18/20 2328

## 2020-09-16 NOTE — ED Triage Notes (Signed)
Pt BIB [facility] from home reporting L intermittent flank pain and diarrhea. Denies urinary changes. Tenderness noted above L kidney.

## 2020-09-19 ENCOUNTER — Other Ambulatory Visit: Payer: Medicaid Other

## 2020-10-09 ENCOUNTER — Telehealth (HOSPITAL_COMMUNITY): Payer: Self-pay | Admitting: Emergency Medicine

## 2020-10-09 NOTE — Telephone Encounter (Signed)
Attempted to call patient regarding upcoming cardiac MR appointment. Left message on voicemail with name and callback number Chadley Dziedzic RN Navigator Cardiac Imaging [facility] [phone] Office [phone] Cell  

## 2020-10-09 NOTE — Telephone Encounter (Signed)
Reaching out to patient to offer assistance regarding upcoming cardiac imaging study; pt verbalizes understanding of appt date/time, parking situation and where to check in, and verified current allergies; name and call back number provided for further questions should they arise [name] RN Navigator Cardiac [place] [facility] [phone] office [phone] cell  Pt asked to hold diuretics, denies implants, denies claustro Daniel Joseph

## 2020-10-10 ENCOUNTER — Ambulatory Visit (HOSPITAL_COMMUNITY)
Admission: RE | Admit: 2020-10-10 | Discharge: 2020-10-10 | Disposition: A | Discharge status: Home / Self Care | Payer: Medicaid Other | Source: Ambulatory Visit | Attending: Cardiology | Admitting: Cardiology

## 2020-10-10 ENCOUNTER — Other Ambulatory Visit: Payer: Self-pay

## 2020-10-10 DIAGNOSIS — I340 Nonrheumatic mitral (valve) insufficiency: Secondary | ICD-10-CM | POA: Diagnosis not present

## 2020-10-10 DIAGNOSIS — I5042 Chronic combined systolic (congestive) and diastolic (congestive) heart failure: Secondary | ICD-10-CM | POA: Insufficient documentation

## 2020-10-10 MED ORDER — GADOBUTROL 1 MMOL/ML IV SOLN
10.0000 mL | Freq: Once | INTRAVENOUS | Status: AC | PRN
  Administered 2020-10-10: 10 mL via INTRAVENOUS

## 2020-10-18 ENCOUNTER — Telehealth (HOSPITAL_COMMUNITY): Payer: Self-pay | Admitting: Cardiology

## 2020-10-18 NOTE — Telephone Encounter (Signed)
Left vm requesting return call.  

## 2020-10-18 NOTE — Telephone Encounter (Signed)
Pt returned call to get MRI results, he can be reached @[phone]

## 2020-10-19 ENCOUNTER — Telehealth (HOSPITAL_COMMUNITY): Payer: Self-pay

## 2020-10-19 NOTE — Telephone Encounter (Signed)
Lmtrc.letter mailed to address on file 

## 2020-10-19 NOTE — Telephone Encounter (Signed)
-----   Message from Larey Dresser, MD sent at 10/12/2020 11:25 AM EST ----- I'd like to see him sooner to discuss cardiac surgery.  Also need to make sure he has cardiac PET for sarcoidosis set up at [facility].  Thought we had made the referral after may last appt with him.  ----- Message ----- From: Shonna Chock, CMA Sent: 4/50/3888   9:31 AM EST To: Larey Dresser, MD  Patient has pending appt 2/17, does he need to be seen sooner?

## 2020-10-27 ENCOUNTER — Other Ambulatory Visit (HOSPITAL_COMMUNITY): Payer: Self-pay | Admitting: Cardiology

## 2020-10-27 DIAGNOSIS — I4892 Unspecified atrial flutter: Secondary | ICD-10-CM

## 2020-10-31 ENCOUNTER — Telehealth (HOSPITAL_COMMUNITY): Payer: Self-pay | Admitting: *Deleted

## 2020-10-31 NOTE — Telephone Encounter (Addendum)
New pre cert sent for PET scan. In clinical review.   Daniel Joseph # [id number] exp. 12/26/20

## 2020-11-02 ENCOUNTER — Other Ambulatory Visit: Payer: Self-pay

## 2020-11-02 ENCOUNTER — Ambulatory Visit (HOSPITAL_COMMUNITY)
Admission: RE | Admit: 2020-11-02 | Discharge: 2020-11-02 | Disposition: A | Discharge status: Home / Self Care | Payer: Medicaid Other | Source: Ambulatory Visit | Attending: Cardiology | Admitting: Cardiology

## 2020-11-02 ENCOUNTER — Encounter (HOSPITAL_COMMUNITY): Payer: Self-pay | Admitting: Cardiology

## 2020-11-02 VITALS — BP 160/90 | HR 68 | Wt 230.2 lb

## 2020-11-02 DIAGNOSIS — I341 Nonrheumatic mitral (valve) prolapse: Secondary | ICD-10-CM | POA: Insufficient documentation

## 2020-11-02 DIAGNOSIS — Z79899 Other long term (current) drug therapy: Secondary | ICD-10-CM | POA: Diagnosis not present

## 2020-11-02 DIAGNOSIS — I5042 Chronic combined systolic (congestive) and diastolic (congestive) heart failure: Secondary | ICD-10-CM | POA: Insufficient documentation

## 2020-11-02 DIAGNOSIS — I340 Nonrheumatic mitral (valve) insufficiency: Secondary | ICD-10-CM | POA: Diagnosis not present

## 2020-11-02 DIAGNOSIS — Z7901 Long term (current) use of anticoagulants: Secondary | ICD-10-CM | POA: Insufficient documentation

## 2020-11-02 DIAGNOSIS — I4892 Unspecified atrial flutter: Secondary | ICD-10-CM | POA: Insufficient documentation

## 2020-11-02 DIAGNOSIS — R0609 Other forms of dyspnea: Secondary | ICD-10-CM | POA: Insufficient documentation

## 2020-11-02 DIAGNOSIS — Z8249 Family history of ischemic heart disease and other diseases of the circulatory system: Secondary | ICD-10-CM | POA: Diagnosis not present

## 2020-11-02 DIAGNOSIS — I2510 Atherosclerotic heart disease of native coronary artery without angina pectoris: Secondary | ICD-10-CM | POA: Insufficient documentation

## 2020-11-02 DIAGNOSIS — I110 Hypertensive heart disease with heart failure: Secondary | ICD-10-CM | POA: Diagnosis not present

## 2020-11-02 DIAGNOSIS — I428 Other cardiomyopathies: Secondary | ICD-10-CM | POA: Insufficient documentation

## 2020-11-02 LAB — BASIC METABOLIC PANEL
Anion gap: 8 (ref 5–15)
BUN: 15 mg/dL (ref 6–20)
CO2: 24 mmol/L (ref 22–32)
Calcium: 9.3 mg/dL (ref 8.9–10.3)
Chloride: 108 mmol/L (ref 98–111)
Creatinine, Ser: 1.37 mg/dL — ABNORMAL HIGH (ref 0.61–1.24)
GFR, Estimated: >60 mL/min (ref >60)
Glucose, Bld: 97 mg/dL (ref 70–99)
Potassium: 4.3 mmol/L (ref 3.5–5.1)
Sodium: 140 mmol/L (ref 135–145)

## 2020-11-02 MED ORDER — POTASSIUM CHLORIDE CRYS ER 10 MEQ PO TBCR: 10.0000 meq | EXTENDED_RELEASE_TABLET | Freq: Every day | ORAL | 3 refills | Status: DC

## 2020-11-02 MED ORDER — HYDRALAZINE HCL 100 MG PO TABS: 100.0000 mg | ORAL_TABLET | Freq: Three times a day (TID) | ORAL | 3 refills | Status: DC

## 2020-11-02 MED ORDER — ISOSORBIDE MONONITRATE ER 60 MG PO TB24: 90.0000 mg | ORAL_TABLET | Freq: Every day | ORAL | 3 refills | Status: DC

## 2020-11-02 MED ORDER — SPIRONOLACTONE 50 MG PO TABS: 50.0000 mg | ORAL_TABLET | Freq: Every day | ORAL | 3 refills | Status: DC

## 2020-11-02 NOTE — Patient Instructions (Addendum)
Labs done today. We will contact you only if your labs are abnormal.  INCREASE Spironolactone to 50mg  (1 tablet) by mouth daily.  INCREASE Hydralazine to 100mg  (1 tablet) by mouth 3 times daily.  INCREASE Imdur to 90mg  (1 & 1/2 tablets) by mouth daily.  DECREASE Potassium to 75meq (1 tablet) by mouth daily   (new prescriptions were sent into the pharmacy for the medications above) No other medication changes were made. Please continue all current medications as prescribed.  Your physician recommends that you schedule a follow-up appointment in: 3 weeks with Dr. Kendall Flack have been referred to [facility] Sur[facility]. They will contact you to schedule an appointment.    [facility] will contact you regarding your Cardiac PET Scan.  Non-Cardiac CT scanning, (CAT scanning), is a noninvasive, special x-ray that produces cross-sectional images of the body using x-rays and a computer. CT scans help physicians diagnose and treat medical conditions. For some CT exams, a contrast material is used to enhance visibility in the area of the body being studied. CT scans provide greater clarity and reveal more details than regular x-ray exams. This has to be approved through your insurance prior to scheduling, once approved we will contact you to schedule an appointment.    If you have any questions or concerns before your next appointment please send [place] a message through [place] or call our office at [phone].    TO LEAVE A MESSAGE FOR THE NURSE SELECT OPTION 2, PLEASE LEAVE A MESSAGE INCLUDING: . YOUR NAME . DATE OF BIRTH . CALL BACK NUMBER . REASON FOR CALL**this is important as we prioritize the call backs  YOU WILL RECEIVE A CALL BACK THE SAME DAY AS LONG AS YOU CALL BEFORE 4:00 PM   Do the following things EVERYDAY: 1) Weigh yourself in the morning before breakfast. Write it down and keep it in a log. 2) Take your medicines as prescribed 3) Eat low salt foods--Limit salt (sodium) to 2000  mg per day.  4) Stay as active as you can everyday 5) Limit all fluids for the day to less than 2 liters   At the [facility], you and your health needs are our priority. As part of our continuing mission to provide you with exceptional heart care, we have created designated Provider Care Teams. These Care Teams include your primary Cardiologist (physician) and Advanced Practice Providers (APPs- Physician Assistants and Nurse Practitioners) who all work together to provide you with the care you need, when you need it.   You may see any of the following providers on your designated Care Team at your next follow up: Marland Kitchen Dr Glori Bickers . Dr Loralie Champagne . Darrick Grinder, NP . Lyda Jester, PA . Audry Riles, PharmD   Please be sure to bring in all your medications bottles to every appointment.

## 2020-11-02 NOTE — H&P (View-Only) (Signed)
PCP: [name], MD Cardiology: Dr. [name] HF Cardiology: Dr. [name]  60 y.o. 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 [name] for evaluation of CHF. 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  Cardiolite in 5/16 showed no ischemia but possible apical infarction.  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  PYP scan was read as equivocal in 5/20 but probably not suggestive of transthyretin amyloidosis.   TEE was done in 6/20 to assess severity of MR.  EF was 50% with mild mitral valve prolapse and moderate MR.  RHC/LHC showed nonobstructive CAD, pulmonary venous hypertension, relatively controlled filling pressures. 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   Echo in 3/21 showed EF 55-60%, normal RV, severe LAE, moderate-severe MR.    Patient's BP has been poorly controlled over time.  He has not been consistent with medication compliance.  Renal artery dopplers were unremarkable in 6/16.   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   He had atrial flutter in 9/21 but has been back in NSR.   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   Patient returns for followup of CHF.  No palpitations.  He is short of breath walking up hills and stairs.  No dyspnea walking on flat ground.  No chest pain.  No  orthopnea/PND.  BP has been running high, 627O-350K systolic at home.  Weight is up 4 lbs.   ECG (personally reviewed): NSR, LVH  Patient presents for followup of CHF.  BP is controlled.  Weight is stable.   Labs (5/20): K 3.5, creatinine 1.23 Labs (6/20): Urine immunofixation negative, K 4.2, creatinine 1.36 Labs (7/20): creatinine 1.43 Labs (9/20): uric acid 7.5, LDL 67 Labs (1/21): K 3.9, creatinine 1.33, BNP 388, ACE level low Labs (5/21): LDL 57 Labs (9/21): BNP 804, K 4.1, creatinine 1.42 Labs (12/21): K 4.8, creatinine 1.3  PMH: 1. Gout 2. H/o NSVT/PVCs: Zio patch 1/22 with rare PVCs, few short NSVT runs.  3. 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  Seen on multiple echoes, first in 2015.  4. HTN: Renal artery dopplers in 6/16 were unremarkable. Sleep study about 2 years ago was negative per patient's report.  5. Mitral regurgitation: TEE in 2015 with mild to moderate MR.  - Echo (2/20): Moderate MR.  - Echo (5/20): Possible severe MR - TEE (6/20): Mild mitral valve prolapse with moderate MR.  - Echo (3/21): Moderate to severe MR.  - TEE (9/21): moderate-severe MR with prolapse of A2 (eccentric MR).  - Cardiac MRI (1/22) with severe MR (regurgitant fraction 54%, regurgitant volume 83 cc).  6. 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  - Cardiolite (5/16): Possible apical infarct, no ischemia.  - Echo (7/19): EF 50-55%.  - Echo (2/20): EF 50-55%, moderate MR, TV fibroelastoma.  - PYP scan (5/20): Grade 1 visually, H/CL 1.23.  Read  as equivocal but likely negative for TTR amyloidosis.  - Echo (5/20): EF 40-45%, mildly dilated LV with mild LVH, normal RV size and systolic function, possible severe MR, moderate TR, 1 cm nodule on TV may be fibroelastoma, PASP 79 mmHg.  - TEE (6/20): EF 50%, mild LV dilation, mild LVH, moderate LAE, tricuspid valve mass likely fibroelastoma, mild MVP with moderate MR.   - LHC/RHC (6/20): 50% LAD, 30% pRCA; mean RA 4, PA 55/21 mean 34, mean PCWP 17, CI 2.66, PVR 2.8 WU.  - Cardiac MRI (8/20): EF 53%, mild LVH, inferoseptal/inferior mid-wall LGE (?cardiac sarcoidosis, does not look like amyloidosis), mild RV dilation with RVEF 54%, moderate eccentric MR, severe LAE. - Echo (3/21):  EF 55-60%, normal RV, severe LAE, moderate-severe MR. 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  - Cardiac MRI (1/22): moderate LV dilation with EF 45%, diffuse hypokinesis worse in the basal-mid inferior and inferoseptal walls, moderate RV dilation with EF 54%, severe MR with regurgitant fraction 54% and regurgitant volume 83 cc, mid-wall LGE in the basal inferoseptal and inferior walls concerning for possible cardiac sarcoidosis.  7. Atrial flutter: Noted first in 9/21.   FH:  Father with MI at 50, brother with MI at 75, mother with CHF, MI.  HTN in multiple family members.   Social History   Socioeconomic History  . Marital status: Divorced    Spouse name: Not on file  . Number of children: Not on file  . Years of education: Not on file  . Highest education level: Not on file  Occupational History  . Not on file  Tobacco Use  . Smoking status: Never Smoker  . Smokeless tobacco: Never Used  Vaping Use  . Vaping Use: Never used  Substance and Sexual Activity  . Alcohol use: No  . Drug use: No  . Sexual activity: Yes  Other Topics Concern  . Not on file  Social History Narrative  . Not on file   Social Determinants of Health   Financial Resource Strain: Not on file  Food Insecurity: Not on file  Transportation Needs: Not on file  Physical Activity: Not on file  Stress: Not on file  Social Connections: Not on file  Intimate Partner Violence: Not on file   ROS: All systems reviewed and negative except as per HPI.   Current Outpatient Medications   Medication Sig Dispense Refill  . acetaminophen (TYLENOL) 650 MG CR tablet Take 1,300 mg by mouth every 8 (eight) hours as needed for pain.    Marland Kitchen allopurinol (ZYLOPRIM) 100 MG tablet Take 1 tablet (100 mg total) by mouth 2 (two) times daily. 60 tablet 6  . amLODipine (NORVASC) 10 MG tablet Take 10 mg by mouth daily.    Marland Kitchen apixaban (ELIQUIS) 5 MG TABS tablet Take 1 tablet (5 mg total) by mouth 2 (two) times daily. 60 tablet 0  . bisacodyl (DULCOLAX) 5 MG EC tablet Take 1 tablet (5 mg total) by mouth daily as needed for moderate constipation. 4 tablet 0  . carvedilol (COREG) 25 MG tablet Take 1 tablet (25 mg total) by mouth 2 (two) times daily. 180 tablet 3  . cetirizine (ZYRTEC) 10 MG tablet Take 10 mg by mouth daily as needed for allergies.    Marland Kitchen dicyclomine (BENTYL) 20 MG tablet Take 1 tablet (20 mg total) by mouth 2 (two) times daily. 20 tablet 0  .  docusate sodium (COLACE) 100 MG capsule Take 100 mg by mouth daily as needed for mild constipation.    . Ensure Max Protein (ENSURE MAX PROTEIN) LIQD Take 330 mLs (11 oz total) by mouth daily. 330 mL 3  . furosemide (LASIX) 20 MG tablet TAKE 2 TABLETS (40 MG TOTAL) BY MOUTH DAILY. 60 tablet 0  . gabapentin (NEURONTIN) 300 MG capsule Take 300 mg by mouth daily as needed (nerve pain).    Marland Kitchen losartan (COZAAR) 100 MG tablet Take 1 tablet (100 mg total) by mouth daily. 90 tablet 3  . Multiple Vitamin (MULTIVITAMIN WITH MINERALS) TABS tablet Take 1 tablet by mouth daily. 30 tablet 0  . ondansetron (ZOFRAN ODT) 8 MG disintegrating tablet Take 1 tablet (8 mg total) by mouth every 8 (eight) hours as needed for nausea or vomiting. 20 tablet 0  . polyethylene glycol powder (GLYCOLAX/MIRALAX) 17 GM/SCOOP powder Take 1 Container by mouth daily as needed for mild constipation, moderate constipation or severe constipation.    . rosuvastatin (CRESTOR) 10 MG tablet Take 1 tablet (10 mg total) by mouth daily. 90 tablet 3  . tizanidine (ZANAFLEX) 2 MG capsule Take 2 mg by  mouth 3 (three) times daily as needed for muscle spasms.    . hydrALAZINE (APRESOLINE) 100 MG tablet Take 1 tablet (100 mg total) by mouth 3 (three) times daily. 270 tablet 3  . isosorbide mononitrate (IMDUR) 60 MG 24 hr tablet Take 1.5 tablets (90 mg total) by mouth daily. 135 tablet 3  . potassium chloride SA (KLOR-CON) 10 MEQ tablet Take 1 tablet (10 mEq total) by mouth daily. 90 tablet 3  . spironolactone (ALDACTONE) 50 MG tablet Take 1 tablet (50 mg total) by mouth daily. 90 tablet 3   No current facility-administered medications for this encounter.   BP (!) 160/90   Pulse 68   Wt 104.4 kg (230 lb 3.2 oz)   SpO2 97%   BMI 30.37 kg/m  General: NAD Neck: No JVD, no thyromegaly or thyroid nodule.  Lungs: Clear to auscultation bilaterally with normal respiratory effort. CV: Nondisplaced PMI.  Heart regular S1/S2, no S3/S4, 3/6 HSM apex.  No peripheral edema.  No carotid bruit.  Normal pedal pulses.  Abdomen: Soft, nontender, no hepatosplenomegaly, no distention.  Skin: Intact without lesions or rashes.  Neurologic: Alert and oriented x 3.  Psych: Normal affect. Extremities: No clubbing or cyanosis.  HEENT: Normal.   Assessment/Plan: 1. HTN: Negative renal artery dopplers and sleep study in past.  On multiple agents. Historically poorly controlled.   - Unable to tolerate Entresto due to cough, continue losartan 100 mg daily.  - Continue amlodipine 10 mg daily.  - Increase hydralazine to 100 mg tid an Imdur to 90 mg daily.  - Increase spironolactone to 50 mg daily.  - Continue Coreg 25 mg bid.  2. 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  Echo in 5/20 showed EF 40-45% with normal RV, possible severe mitral regurgitation, and severe pulmonary hypertension.  PYP scan in 5/20 was read as equivocal, but I suspect this is not suggestive of transthyretin amyloidosis. TEE in 5/20 showed EF 50%, mild LV dilation, moderate MR.  RHC/LHC  showed mildly elevated PCWP, pulmonary venous hypertension, and nonobstructive CAD.  Cardiac MRI in 8/20 showed EF 53% and had an LGE pattern that was suggestive of possible cardiac sarcoidosis (not amyloidosis).  Echo in 3/21 showed EF up to 55-60%.  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  Cardiac MRI in 1/22 with moderate LV dilation with EF 45%, diffuse hypokinesis worse in the basal-mid inferior and inferoseptal walls, moderate RV dilation with EF 54%, severe MR with regurgitant fraction 54% and regurgitant volume 83 cc, mid-wall LGE in the basal inferoseptal and inferior walls concerning for possible cardiac sarcoidosis. He is not volume overloaded on exam, NYHA class II symptoms.  - Continue Lasix 40 mg daily with BMET today.  - Continue losartan 100 mg daily.   - Continue Coreg 25 mg bid.  - Increase spironolactone to 50 mg daily and decrease KCl to 10 mEq daily.  BMET today and in 10 days.  - Increase hydralazine to 100 mg tid and Imdur to 90 mg daily as above.  - Given concern for possible cardiac sarcoidosis, I am trying to arrange for cardiac PET to be done at [facility] to assess for cardiac sarcoidosis.  I will arrange for a high resolution chest CT to assess for evidence for pulmonary sarcoidosis.  Check ACE level.  3. Mitral regurgitation: ?severe on 5/20 TTE but TEE in 6/20 showed mild mitral valve prolapse with only moderate MR.  Cardiac MRI in 8/20 also suggested moderate MR. TTE in 3/21 suggested moderate-severe MR. TEE in 9/21 with moderate-severe MR, A2 prolapse.  Repeat cardiac MRI in 1/22 clearly showed severe MR with regurgitant fraction 54% and regurgitant volume 83 cc.  He has a prominent murmur on exam. He has NYHA class II exertional dyspnea.  - I would like him evaluated for MV repair, will refer to Dr. Roxy Manns.  - I will arrange for RHC/LHC to be done in expectation of  future MV repair. We discussed risks/benefits and he agrees to procedure.  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  5. 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  - continue Crestor, good lipids in 5/21.  - See above regarding repeat cath pre-MV repair.  6. Atrial flutter: In NSR today.    - Continue Eliquis 5 mg bid, CBC today.  - Consider atrial flutter ablation if flutter returns.  Loralie Champagne 11/02/2020

## 2020-11-02 NOTE — Progress Notes (Signed)
PCP: [name], MD Cardiology: Dr. [name] HF Cardiology: Dr. [name]  60 y.o. 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 [name] for evaluation of CHF. 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  Cardiolite in 5/16 showed no ischemia but possible apical infarction.  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  PYP scan was read as equivocal in 5/20 but probably not suggestive of transthyretin amyloidosis.   TEE was done in 6/20 to assess severity of MR.  EF was 50% with mild mitral valve prolapse and moderate MR.  RHC/LHC showed nonobstructive CAD, pulmonary venous hypertension, relatively controlled filling pressures. 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   Echo in 3/21 showed EF 55-60%, normal RV, severe LAE, moderate-severe MR.    Patient's BP has been poorly controlled over time.  He has not been consistent with medication compliance.  Renal artery dopplers were unremarkable in 6/16.   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   He had atrial flutter in 9/21 but has been back in NSR.   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   Patient returns for followup of CHF.  No palpitations.  He is short of breath walking up hills and stairs.  No dyspnea walking on flat ground.  No chest pain.  No  orthopnea/PND.  BP has been running high, [id number] systolic at home.  Weight is up 4 lbs.   ECG (personally reviewed): NSR, LVH  Patient presents for followup of CHF.  BP is controlled.  Weight is stable.   Labs (5/20): K 3.5, creatinine 1.23 Labs (6/20): Urine immunofixation negative, K 4.2, creatinine 1.36 Labs (7/20): creatinine 1.43 Labs (9/20): uric acid 7.5, LDL 67 Labs (1/21): K 3.9, creatinine 1.33, BNP 388, ACE level low Labs (5/21): LDL 57 Labs (9/21): BNP 804, K 4.1, creatinine 1.42 Labs (12/21): K 4.8, creatinine 1.3  PMH: 1. Gout 2. H/o NSVT/PVCs: Zio patch 1/22 with rare PVCs, few short NSVT runs.  3. 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  Seen on multiple echoes, first in 2015.  4. HTN: Renal artery dopplers in 6/16 were unremarkable. Sleep study about 2 years ago was negative per patient's report.  5. Mitral regurgitation: TEE in 2015 with mild to moderate MR.  - Echo (2/20): Moderate MR.  - Echo (5/20): Possible severe MR - TEE (6/20): Mild mitral valve prolapse with moderate MR.  - Echo (3/21): Moderate to severe MR.  - TEE (9/21): moderate-severe MR with prolapse of A2 (eccentric MR).  - Cardiac MRI (1/22) with severe MR (regurgitant fraction 54%, regurgitant volume 83 cc).  6. 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  - Cardiolite (5/16): Possible apical infarct, no ischemia.  - Echo (7/19): EF 50-55%.  - Echo (2/20): EF 50-55%, moderate MR, TV fibroelastoma.  - PYP scan (5/20): Grade 1 visually, H/CL 1.23.  Read  as equivocal but likely negative for TTR amyloidosis.  - Echo (5/20): EF 40-45%, mildly dilated LV with mild LVH, normal RV size and systolic function, possible severe MR, moderate TR, 1 cm nodule on TV may be fibroelastoma, PASP 79 mmHg.  - TEE (6/20): EF 50%, mild LV dilation, mild LVH, moderate LAE, tricuspid valve mass likely fibroelastoma, mild MVP with moderate MR.   - LHC/RHC (6/20): 50% LAD, 30% pRCA; mean RA 4, PA 55/21 mean 34, mean PCWP 17, CI 2.66, PVR 2.8 WU.  - Cardiac MRI (8/20): EF 53%, mild LVH, inferoseptal/inferior mid-wall LGE (?cardiac sarcoidosis, does not look like amyloidosis), mild RV dilation with RVEF 54%, moderate eccentric MR, severe LAE. - Echo (3/21):  EF 55-60%, normal RV, severe LAE, moderate-severe MR. 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  - Cardiac MRI (1/22): moderate LV dilation with EF 45%, diffuse hypokinesis worse in the basal-mid inferior and inferoseptal walls, moderate RV dilation with EF 54%, severe MR with regurgitant fraction 54% and regurgitant volume 83 cc, mid-wall LGE in the basal inferoseptal and inferior walls concerning for possible cardiac sarcoidosis.  7. Atrial flutter: Noted first in 9/21.   FH:  Father with MI at 2, brother with MI at 71, mother with CHF, MI.  HTN in multiple family members.   Social History   Socioeconomic History  . Marital status: Divorced    Spouse name: Not on file  . Number of children: Not on file  . Years of education: Not on file  . Highest education level: Not on file  Occupational History  . Not on file  Tobacco Use  . Smoking status: Never Smoker  . Smokeless tobacco: Never Used  Vaping Use  . Vaping Use: Never used  Substance and Sexual Activity  . Alcohol use: No  . Drug use: No  . Sexual activity: Yes  Other Topics Concern  . Not on file  Social History Narrative  . Not on file   Social Determinants of Health   Financial Resource Strain: Not on file  Food Insecurity: Not on file  Transportation Needs: Not on file  Physical Activity: Not on file  Stress: Not on file  Social Connections: Not on file  Intimate Partner Violence: Not on file   ROS: All systems reviewed and negative except as per HPI.   Current Outpatient Medications   Medication Sig Dispense Refill  . acetaminophen (TYLENOL) 650 MG CR tablet Take 1,300 mg by mouth every 8 (eight) hours as needed for pain.    Marland Kitchen allopurinol (ZYLOPRIM) 100 MG tablet Take 1 tablet (100 mg total) by mouth 2 (two) times daily. 60 tablet 6  . amLODipine (NORVASC) 10 MG tablet Take 10 mg by mouth daily.    Marland Kitchen apixaban (ELIQUIS) 5 MG TABS tablet Take 1 tablet (5 mg total) by mouth 2 (two) times daily. 60 tablet 0  . bisacodyl (DULCOLAX) 5 MG EC tablet Take 1 tablet (5 mg total) by mouth daily as needed for moderate constipation. 4 tablet 0  . carvedilol (COREG) 25 MG tablet Take 1 tablet (25 mg total) by mouth 2 (two) times daily. 180 tablet 3  . cetirizine (ZYRTEC) 10 MG tablet Take 10 mg by mouth daily as needed for allergies.    Marland Kitchen dicyclomine (BENTYL) 20 MG tablet Take 1 tablet (20 mg total) by mouth 2 (two) times daily. 20 tablet 0  .  docusate sodium (COLACE) 100 MG capsule Take 100 mg by mouth daily as needed for mild constipation.    . Ensure Max Protein (ENSURE MAX PROTEIN) LIQD Take 330 mLs (11 oz total) by mouth daily. 330 mL 3  . furosemide (LASIX) 20 MG tablet TAKE 2 TABLETS (40 MG TOTAL) BY MOUTH DAILY. 60 tablet 0  . gabapentin (NEURONTIN) 300 MG capsule Take 300 mg by mouth daily as needed (nerve pain).    Marland Kitchen losartan (COZAAR) 100 MG tablet Take 1 tablet (100 mg total) by mouth daily. 90 tablet 3  . Multiple Vitamin (MULTIVITAMIN WITH MINERALS) TABS tablet Take 1 tablet by mouth daily. 30 tablet 0  . ondansetron (ZOFRAN ODT) 8 MG disintegrating tablet Take 1 tablet (8 mg total) by mouth every 8 (eight) hours as needed for nausea or vomiting. 20 tablet 0  . polyethylene glycol powder (GLYCOLAX/MIRALAX) 17 GM/SCOOP powder Take 1 Container by mouth daily as needed for mild constipation, moderate constipation or severe constipation.    . rosuvastatin (CRESTOR) 10 MG tablet Take 1 tablet (10 mg total) by mouth daily. 90 tablet 3  . tizanidine (ZANAFLEX) 2 MG capsule Take 2 mg by  mouth 3 (three) times daily as needed for muscle spasms.    . hydrALAZINE (APRESOLINE) 100 MG tablet Take 1 tablet (100 mg total) by mouth 3 (three) times daily. 270 tablet 3  . isosorbide mononitrate (IMDUR) 60 MG 24 hr tablet Take 1.5 tablets (90 mg total) by mouth daily. 135 tablet 3  . potassium chloride SA (KLOR-CON) 10 MEQ tablet Take 1 tablet (10 mEq total) by mouth daily. 90 tablet 3  . spironolactone (ALDACTONE) 50 MG tablet Take 1 tablet (50 mg total) by mouth daily. 90 tablet 3   No current facility-administered medications for this encounter.   BP (!) 160/90   Pulse 68   Wt 104.4 kg (230 lb 3.2 oz)   SpO2 97%   BMI 30.37 kg/m  General: NAD Neck: No JVD, no thyromegaly or thyroid nodule.  Lungs: Clear to auscultation bilaterally with normal respiratory effort. CV: Nondisplaced PMI.  Heart regular S1/S2, no S3/S4, 3/6 HSM apex.  No peripheral edema.  No carotid bruit.  Normal pedal pulses.  Abdomen: Soft, nontender, no hepatosplenomegaly, no distention.  Skin: Intact without lesions or rashes.  Neurologic: Alert and oriented x 3.  Psych: Normal affect. Extremities: No clubbing or cyanosis.  HEENT: Normal.   Assessment/Plan: 1. HTN: Negative renal artery dopplers and sleep study in past.  On multiple agents. Historically poorly controlled.   - Unable to tolerate Entresto due to cough, continue losartan 100 mg daily.  - Continue amlodipine 10 mg daily.  - Increase hydralazine to 100 mg tid an Imdur to 90 mg daily.  - Increase spironolactone to 50 mg daily.  - Continue Coreg 25 mg bid.  2. 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  Echo in 5/20 showed EF 40-45% with normal RV, possible severe mitral regurgitation, and severe pulmonary hypertension.  PYP scan in 5/20 was read as equivocal, but I suspect this is not suggestive of transthyretin amyloidosis. TEE in 5/20 showed EF 50%, mild LV dilation, moderate MR.  RHC/LHC  showed mildly elevated PCWP, pulmonary venous hypertension, and nonobstructive CAD.  Cardiac MRI in 8/20 showed EF 53% and had an LGE pattern that was suggestive of possible cardiac sarcoidosis (not amyloidosis).  Echo in 3/21 showed EF up to 55-60%.  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  Cardiac MRI in 1/22 with moderate LV dilation with EF 45%, diffuse hypokinesis worse in the basal-mid inferior and inferoseptal walls, moderate RV dilation with EF 54%, severe MR with regurgitant fraction 54% and regurgitant volume 83 cc, mid-wall LGE in the basal inferoseptal and inferior walls concerning for possible cardiac sarcoidosis. He is not volume overloaded on exam, NYHA class II symptoms.  - Continue Lasix 40 mg daily with BMET today.  - Continue losartan 100 mg daily.   - Continue Coreg 25 mg bid.  - Increase spironolactone to 50 mg daily and decrease KCl to 10 mEq daily.  BMET today and in 10 days.  - Increase hydralazine to 100 mg tid and Imdur to 90 mg daily as above.  - Given concern for possible cardiac sarcoidosis, I am trying to arrange for cardiac PET to be done at [facility] to assess for cardiac sarcoidosis.  I will arrange for a high resolution chest CT to assess for evidence for pulmonary sarcoidosis.  Check ACE level.  3. Mitral regurgitation: ?severe on 5/20 TTE but TEE in 6/20 showed mild mitral valve prolapse with only moderate MR.  Cardiac MRI in 8/20 also suggested moderate MR. TTE in 3/21 suggested moderate-severe MR. TEE in 9/21 with moderate-severe MR, A2 prolapse.  Repeat cardiac MRI in 1/22 clearly showed severe MR with regurgitant fraction 54% and regurgitant volume 83 cc.  He has a prominent murmur on exam. He has NYHA class II exertional dyspnea.  - I would like him evaluated for MV repair, will refer to Dr. Roxy Manns.  - I will arrange for RHC/LHC to be done in expectation of  future MV repair. We discussed risks/benefits and he agrees to procedure.  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  5. 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  - continue Crestor, good lipids in 5/21.  - See above regarding repeat cath pre-MV repair.  6. Atrial flutter: In NSR today.    - Continue Eliquis 5 mg bid, CBC today.  - Consider atrial flutter ablation if flutter returns.  Loralie Champagne 11/02/2020

## 2020-11-03 LAB — ANGIOTENSIN CONVERTING ENZYME: Angiotensin-Converting Enzyme: 46 U/L (ref 14–82)

## 2020-11-06 ENCOUNTER — Other Ambulatory Visit (HOSPITAL_COMMUNITY): Payer: Self-pay | Admitting: *Deleted

## 2020-11-06 DIAGNOSIS — I5042 Chronic combined systolic (congestive) and diastolic (congestive) heart failure: Secondary | ICD-10-CM

## 2020-11-07 ENCOUNTER — Other Ambulatory Visit (HOSPITAL_COMMUNITY)
Admission: RE | Admit: 2020-11-07 | Discharge: 2020-11-07 | Disposition: A | Discharge status: Home / Self Care | Payer: Medicaid Other | Source: Ambulatory Visit | Attending: Cardiology | Admitting: Cardiology

## 2020-11-07 ENCOUNTER — Other Ambulatory Visit (HOSPITAL_COMMUNITY): Payer: Self-pay | Admitting: *Deleted

## 2020-11-07 DIAGNOSIS — Z20822 Contact with and (suspected) exposure to covid-19: Secondary | ICD-10-CM | POA: Insufficient documentation

## 2020-11-07 DIAGNOSIS — Z01812 Encounter for preprocedural laboratory examination: Secondary | ICD-10-CM | POA: Diagnosis not present

## 2020-11-07 LAB — SARS CORONAVIRUS 2 (TAT 6-24 HRS): SARS Coronavirus 2: NEGATIVE

## 2020-11-09 ENCOUNTER — Ambulatory Visit (HOSPITAL_COMMUNITY)
Admission: RE | Admit: 2020-11-09 | Discharge: 2020-11-09 | Disposition: A | Discharge status: Home / Self Care | Payer: Medicaid Other | Attending: Cardiology | Admitting: Cardiology

## 2020-11-09 ENCOUNTER — Other Ambulatory Visit: Payer: Self-pay

## 2020-11-09 ENCOUNTER — Encounter (HOSPITAL_COMMUNITY)
Admission: RE | Disposition: A | Discharge status: Home / Self Care | Payer: Self-pay | Source: Home / Self Care | Attending: Cardiology

## 2020-11-09 DIAGNOSIS — I4892 Unspecified atrial flutter: Secondary | ICD-10-CM | POA: Diagnosis not present

## 2020-11-09 DIAGNOSIS — I428 Other cardiomyopathies: Secondary | ICD-10-CM | POA: Insufficient documentation

## 2020-11-09 DIAGNOSIS — Z7901 Long term (current) use of anticoagulants: Secondary | ICD-10-CM | POA: Diagnosis not present

## 2020-11-09 DIAGNOSIS — Z79899 Other long term (current) drug therapy: Secondary | ICD-10-CM | POA: Diagnosis not present

## 2020-11-09 DIAGNOSIS — I5042 Chronic combined systolic (congestive) and diastolic (congestive) heart failure: Secondary | ICD-10-CM

## 2020-11-09 DIAGNOSIS — I2510 Atherosclerotic heart disease of native coronary artery without angina pectoris: Secondary | ICD-10-CM | POA: Diagnosis not present

## 2020-11-09 DIAGNOSIS — I509 Heart failure, unspecified: Secondary | ICD-10-CM | POA: Diagnosis not present

## 2020-11-09 DIAGNOSIS — I340 Nonrheumatic mitral (valve) insufficiency: Secondary | ICD-10-CM | POA: Insufficient documentation

## 2020-11-09 DIAGNOSIS — I110 Hypertensive heart disease with heart failure: Secondary | ICD-10-CM | POA: Insufficient documentation

## 2020-11-09 HISTORY — PX: RIGHT/LEFT HEART CATH AND CORONARY ANGIOGRAPHY: CATH118266

## 2020-11-09 LAB — BASIC METABOLIC PANEL
Anion gap: 8 (ref 5–15)
BUN: 12 mg/dL (ref 6–20)
CO2: 23 mmol/L (ref 22–32)
Calcium: 8.6 mg/dL — ABNORMAL LOW (ref 8.9–10.3)
Chloride: 102 mmol/L (ref 98–111)
Creatinine, Ser: 1.07 mg/dL (ref 0.61–1.24)
GFR, Estimated: >60 mL/min (ref >60)
Glucose, Bld: 103 mg/dL — ABNORMAL HIGH (ref 70–99)
Potassium: 4.7 mmol/L (ref 3.5–5.1)
Sodium: 133 mmol/L — ABNORMAL LOW (ref 135–145)

## 2020-11-09 LAB — POCT I-STAT EG7
Acid-base deficit: 3 mmol/L — ABNORMAL HIGH (ref 0.0–2.0)
Acid-base deficit: 4 mmol/L — ABNORMAL HIGH (ref 0.0–2.0)
Bicarbonate: 21.9 mmol/L (ref 20.0–28.0)
Bicarbonate: 22.9 mmol/L (ref 20.0–28.0)
Calcium, Ion: 1.17 mmol/L (ref 1.15–1.40)
Calcium, Ion: 1.17 mmol/L (ref 1.15–1.40)
HCT: 39 % (ref 39.0–52.0)
HCT: 40 % (ref 39.0–52.0)
Hemoglobin: 13.3 g/dL (ref 13.0–17.0)
Hemoglobin: 13.6 g/dL (ref 13.0–17.0)
O2 Saturation: 70 %
O2 Saturation: 74 %
Potassium: 5.1 mmol/L (ref 3.5–5.1)
Potassium: 5.1 mmol/L (ref 3.5–5.1)
Sodium: 134 mmol/L — ABNORMAL LOW (ref 135–145)
Sodium: 134 mmol/L — ABNORMAL LOW (ref 135–145)
TCO2: 23 mmol/L (ref 22–32)
TCO2: 24 mmol/L (ref 22–32)
pCO2, Ven: 40.5 mmHg — ABNORMAL LOW (ref 44.0–60.0)
pCO2, Ven: 42.3 mmHg — ABNORMAL LOW (ref 44.0–60.0)
pH, Ven: 7.34 (ref 7.250–7.430)
pH, Ven: 7.341 (ref 7.250–7.430)
pO2, Ven: 38 mmHg (ref 32.0–45.0)
pO2, Ven: 42 mmHg (ref 32.0–45.0)

## 2020-11-09 LAB — CBC
HCT: 37.7 % — ABNORMAL LOW (ref 39.0–52.0)
Hemoglobin: 13 g/dL (ref 13.0–17.0)
MCH: 32.3 pg (ref 26.0–34.0)
MCHC: 34.5 g/dL (ref 30.0–36.0)
MCV: 93.5 fL (ref 80.0–100.0)
Platelets: 130 10*3/uL — ABNORMAL LOW (ref 150–400)
RBC: 4.03 MIL/uL — ABNORMAL LOW (ref 4.22–5.81)
RDW: 12.9 % (ref 11.5–15.5)
WBC: 2.3 10*3/uL — ABNORMAL LOW (ref 4.0–10.5)
nRBC: 0 % (ref 0.0–0.2)

## 2020-11-09 SURGERY — RIGHT/LEFT HEART CATH AND CORONARY ANGIOGRAPHY
Anesthesia: LOCAL

## 2020-11-09 MED ORDER — VERAPAMIL HCL 2.5 MG/ML IV SOLN
INTRAVENOUS | Status: DC | PRN
  Administered 2020-11-09: 10 mL via INTRA_ARTERIAL

## 2020-11-09 MED ORDER — LABETALOL HCL 5 MG/ML IV SOLN: 10.0000 mg | INTRAVENOUS | Status: DC | PRN

## 2020-11-09 MED ORDER — SODIUM CHLORIDE 0.9 % IV SOLN: INTRAVENOUS | Status: DC

## 2020-11-09 MED ORDER — SODIUM CHLORIDE 0.9% FLUSH: 3.0000 mL | Freq: Two times a day (BID) | INTRAVENOUS | Status: DC

## 2020-11-09 MED ORDER — ONDANSETRON HCL 4 MG/2ML IJ SOLN: 4.0000 mg | Freq: Four times a day (QID) | INTRAMUSCULAR | Status: DC | PRN

## 2020-11-09 MED ORDER — APIXABAN 5 MG PO TABS: 5.0000 mg | ORAL_TABLET | Freq: Two times a day (BID) | ORAL | 0 refills | Status: DC

## 2020-11-09 MED ORDER — HEPARIN (PORCINE) IN NACL 1000-0.9 UT/500ML-% IV SOLN
INTRAVENOUS | Status: AC
  Filled 2020-11-09: qty 1500

## 2020-11-09 MED ORDER — LIDOCAINE HCL (PF) 1 % IJ SOLN
INTRAMUSCULAR | Status: DC | PRN
  Administered 2020-11-09 (×2): 2 mL via INTRADERMAL

## 2020-11-09 MED ORDER — ACETAMINOPHEN 325 MG PO TABS: 650.0000 mg | ORAL_TABLET | ORAL | Status: DC | PRN

## 2020-11-09 MED ORDER — ASPIRIN 81 MG PO CHEW
81.0000 mg | CHEWABLE_TABLET | ORAL | Status: DC
  Filled 2020-11-09: qty 1

## 2020-11-09 MED ORDER — FENTANYL CITRATE (PF) 100 MCG/2ML IJ SOLN
INTRAMUSCULAR | Status: DC | PRN
  Administered 2020-11-09: 25 ug via INTRAVENOUS

## 2020-11-09 MED ORDER — SODIUM CHLORIDE 0.9% FLUSH: 3.0000 mL | INTRAVENOUS | Status: DC | PRN

## 2020-11-09 MED ORDER — SODIUM CHLORIDE 0.9 % IV SOLN: 250.0000 mL | INTRAVENOUS | Status: DC | PRN

## 2020-11-09 MED ORDER — IOHEXOL 350 MG/ML SOLN
INTRAVENOUS | Status: DC | PRN
  Administered 2020-11-09: 40 mL via INTRA_ARTERIAL

## 2020-11-09 MED ORDER — HEPARIN SODIUM (PORCINE) 1000 UNIT/ML IJ SOLN
INTRAMUSCULAR | Status: DC | PRN
  Administered 2020-11-09: 5000 [IU] via INTRAVENOUS

## 2020-11-09 MED ORDER — HEPARIN SODIUM (PORCINE) 1000 UNIT/ML IJ SOLN
INTRAMUSCULAR | Status: AC
  Filled 2020-11-09: qty 1

## 2020-11-09 MED ORDER — VERAPAMIL HCL 2.5 MG/ML IV SOLN
INTRAVENOUS | Status: AC
  Filled 2020-11-09: qty 2

## 2020-11-09 MED ORDER — HYDRALAZINE HCL 20 MG/ML IJ SOLN: 10.0000 mg | INTRAMUSCULAR | Status: DC | PRN

## 2020-11-09 MED ORDER — ASPIRIN 81 MG PO CHEW
81.0000 mg | CHEWABLE_TABLET | ORAL | Status: AC
  Administered 2020-11-09: 81 mg via ORAL

## 2020-11-09 MED ORDER — HEPARIN (PORCINE) IN NACL 1000-0.9 UT/500ML-% IV SOLN
INTRAVENOUS | Status: DC | PRN
  Administered 2020-11-09 (×4): 500 mL

## 2020-11-09 MED ORDER — FUROSEMIDE 20 MG PO TABS: ORAL_TABLET | ORAL | 0 refills | Status: DC

## 2020-11-09 MED ORDER — FENTANYL CITRATE (PF) 100 MCG/2ML IJ SOLN
INTRAMUSCULAR | Status: AC
  Filled 2020-11-09: qty 2

## 2020-11-09 MED ORDER — LIDOCAINE HCL (PF) 1 % IJ SOLN
INTRAMUSCULAR | Status: AC
  Filled 2020-11-09: qty 30

## 2020-11-09 MED ORDER — MIDAZOLAM HCL 2 MG/2ML IJ SOLN
INTRAMUSCULAR | Status: AC
  Filled 2020-11-09: qty 2

## 2020-11-09 MED ORDER — MIDAZOLAM HCL 2 MG/2ML IJ SOLN
INTRAMUSCULAR | Status: DC | PRN
  Administered 2020-11-09: 1 mg via INTRAVENOUS

## 2020-11-09 SURGICAL SUPPLY — 12 items
BAG SNAP BAND KOVER 36X36 (MISCELLANEOUS) ×2 IMPLANT
CATH 5FR JL3.5 JR4 ANG PIG MP (CATHETERS) ×2 IMPLANT
CATH BALLN WEDGE 5F 110CM (CATHETERS) ×2 IMPLANT
COVER DOME SNAP 22 D (MISCELLANEOUS) ×2 IMPLANT
DEVICE RAD COMP TR BAND LRG (VASCULAR PRODUCTS) ×2 IMPLANT
GLIDESHEATH SLEND SS 6F .021 (SHEATH) ×2 IMPLANT
GUIDEWIRE INQWIRE 1.5J.035X260 (WIRE) ×1 IMPLANT
INQWIRE 1.5J .035X260CM (WIRE) ×2
KIT HEART LEFT (KITS) ×2 IMPLANT
PACK CARDIAC CATHETERIZATION (CUSTOM PROCEDURE TRAY) ×2 IMPLANT
SHEATH GLIDE SLENDER 4/5FR (SHEATH) ×2 IMPLANT
TRANSDUCER W/STOPCOCK (MISCELLANEOUS) ×2 IMPLANT

## 2020-11-09 NOTE — Interval H&P Note (Signed)
History and Physical Interval Note:  11/09/2020 10:13 AM  Daniel Joseph  has presented today for surgery, with the diagnosis of mvr preop.  The various methods of treatment have been discussed with the patient and family. After consideration of risks, benefits and other options for treatment, the patient has consented to  Procedure(s): RIGHT/LEFT HEART CATH AND CORONARY ANGIOGRAPHY (N/A) as a surgical intervention.  The patient's history has been reviewed, patient examined, no change in status, stable for surgery.  I have reviewed the patient's chart and labs.  Questions were answered to the patient's satisfaction.     [name]

## 2020-11-09 NOTE — Discharge Instructions (Signed)
1. Increase Lasix (furosemide) to 40 mg in the morning and 20 mg in the evening.  2. Restart Eliquis tomorrow morning.   Drink plenty of fluids for 48 hours and keep wrist elevated at heart level for 24 hours  Radial Site Care   This sheet gives you information about how to care for yourself after your procedure. Your health care provider may also give you more specific instructions. If you have problems or questions, contact your health care provider. What can I expect after the procedure? After the procedure, it is common to have:  Bruising and tenderness at the catheter insertion area. Follow these instructions at home: Medicines  Take over-the-counter and prescription medicines only as told by your health care provider. Insertion site care 1. Follow instructions from your health care provider about how to take care of your insertion site. Make sure you: ? Wash your hands with soap and water before you change your bandage (dressing). If soap and water are not available, use hand sanitizer. ? Remove your dressing as told by your health care provider. In 24 hours 2. Check your insertion site every day for signs of infection. Check for: ? Redness, swelling, or pain. ? Fluid or blood. ? Pus or a bad smell. ? Warmth. 3. Do not take baths, swim, or use a hot tub until your health care provider approves. 4. You may shower 24-48 hours after the procedure, or as directed by your health care provider. ? Remove the dressing and gently wash the site with plain soap and water. ? Pat the area dry with a clean towel. ? Do not rub the site. That could cause bleeding. 5. Do not apply powder or lotion to the site. Activity   1. For 24 hours after the procedure, or as directed by your health care provider: ? Do not flex or bend the affected arm. ? Do not push or pull heavy objects with the affected arm. ? Do not drive yourself home from the hospital or clinic. You may drive 24 hours after the  procedure unless your health care provider tells you not to. ? Do not operate machinery or power tools. 2. Do not lift anything that is heavier than 10 lb (4.5 kg), or the limit that you are told, until your health care provider says that it is safe.  For 4 days 3. Ask your health care provider when it is okay to: ? Return to work or school. ? Resume usual physical activities or sports. ? Resume sexual activity. General instructions  If the catheter site starts to bleed, raise your arm and put firm pressure on the site. If the bleeding does not stop, get help right away. This is a medical emergency.  If you went home on the same day as your procedure, a responsible adult should be with you for the first 24 hours after you arrive home.  Keep all follow-up visits as told by your health care provider. This is important. Contact a health care provider if:  You have a fever.  You have redness, swelling, or yellow drainage around your insertion site. Get help right away if:  You have unusual pain at the radial site.  The catheter insertion area swells very fast.  The insertion area is bleeding, and the bleeding does not stop when you hold steady pressure on the area.  Your arm or hand becomes pale, cool, tingly, or numb. These symptoms may represent a serious problem that is an emergency. Do not  wait to see if the symptoms will go away. Get medical help right away. Call your local emergency services (911 in the [place] Do not drive yourself to the hospital. Summary  After the procedure, it is common to have bruising and tenderness at the site.  Follow instructions from your health care provider about how to take care of your radial site wound. Check the wound every day for signs of infection.  Do not lift anything that is heavier than 10 lb (4.5 kg), or the limit that you are told, until your health care provider says that it is safe. This information is not intended to replace advice  given to you by your health care provider. Make sure you discuss any questions you have with your health care provider. Document Revised: 10/08/2017 Document Reviewed: 10/08/2017 Elsevier Patient Education  2020 [employer].

## 2020-11-10 ENCOUNTER — Encounter (HOSPITAL_COMMUNITY): Payer: Self-pay | Admitting: Cardiology

## 2020-11-14 ENCOUNTER — Encounter (HOSPITAL_COMMUNITY): Payer: Self-pay | Admitting: *Deleted

## 2020-11-14 ENCOUNTER — Telehealth (HOSPITAL_COMMUNITY): Payer: Self-pay | Admitting: Vascular Surgery

## 2020-11-14 NOTE — Telephone Encounter (Signed)
Left pt message giving CT HIGH RES appt @ [facility] 11/21/20 @ 4pm, asked pt to call back to confirm appt

## 2020-11-14 NOTE — Progress Notes (Signed)
Cardiac PET scan order/referral form completed and faxed along with records, demographics, and insurance approval to [facility] at [phone]

## 2020-11-20 ENCOUNTER — Encounter: Payer: Medicaid Other | Admitting: Thoracic Surgery (Cardiothoracic Vascular Surgery)

## 2020-11-21 ENCOUNTER — Other Ambulatory Visit: Payer: Self-pay

## 2020-11-21 ENCOUNTER — Ambulatory Visit (HOSPITAL_COMMUNITY)
Admission: RE | Admit: 2020-11-21 | Discharge: 2020-11-21 | Disposition: A | Discharge status: Home / Self Care | Payer: Medicaid Other | Source: Ambulatory Visit | Attending: Cardiology | Admitting: Cardiology

## 2020-11-21 DIAGNOSIS — D869 Sarcoidosis, unspecified: Secondary | ICD-10-CM | POA: Diagnosis not present

## 2020-11-21 DIAGNOSIS — R0602 Shortness of breath: Secondary | ICD-10-CM | POA: Diagnosis not present

## 2020-11-24 DIAGNOSIS — H543 Unqualified visual loss, both eyes: Secondary | ICD-10-CM | POA: Diagnosis not present

## 2020-11-24 DIAGNOSIS — M545 Low back pain, unspecified: Secondary | ICD-10-CM | POA: Diagnosis not present

## 2020-11-24 DIAGNOSIS — J309 Allergic rhinitis, unspecified: Secondary | ICD-10-CM | POA: Diagnosis not present

## 2020-12-05 ENCOUNTER — Ambulatory Visit (HOSPITAL_COMMUNITY)
Admission: RE | Admit: 2020-12-05 | Discharge: 2020-12-05 | Disposition: A | Discharge status: Home / Self Care | Payer: Medicaid Other | Source: Ambulatory Visit | Attending: Cardiology | Admitting: Cardiology

## 2020-12-05 ENCOUNTER — Other Ambulatory Visit: Payer: Self-pay

## 2020-12-05 ENCOUNTER — Other Ambulatory Visit (HOSPITAL_COMMUNITY): Payer: Self-pay | Admitting: Cardiology

## 2020-12-05 ENCOUNTER — Encounter (HOSPITAL_COMMUNITY): Payer: Self-pay | Admitting: Cardiology

## 2020-12-05 VITALS — BP 150/90 | HR 65 | Wt 235.8 lb

## 2020-12-05 DIAGNOSIS — I4892 Unspecified atrial flutter: Secondary | ICD-10-CM | POA: Diagnosis not present

## 2020-12-05 DIAGNOSIS — Z8249 Family history of ischemic heart disease and other diseases of the circulatory system: Secondary | ICD-10-CM | POA: Diagnosis not present

## 2020-12-05 DIAGNOSIS — I110 Hypertensive heart disease with heart failure: Secondary | ICD-10-CM | POA: Insufficient documentation

## 2020-12-05 DIAGNOSIS — Z7901 Long term (current) use of anticoagulants: Secondary | ICD-10-CM | POA: Diagnosis not present

## 2020-12-05 DIAGNOSIS — Z79899 Other long term (current) drug therapy: Secondary | ICD-10-CM | POA: Insufficient documentation

## 2020-12-05 DIAGNOSIS — I5042 Chronic combined systolic (congestive) and diastolic (congestive) heart failure: Secondary | ICD-10-CM | POA: Insufficient documentation

## 2020-12-05 DIAGNOSIS — R590 Localized enlarged lymph nodes: Secondary | ICD-10-CM | POA: Diagnosis not present

## 2020-12-05 DIAGNOSIS — I2510 Atherosclerotic heart disease of native coronary artery without angina pectoris: Secondary | ICD-10-CM | POA: Diagnosis not present

## 2020-12-05 DIAGNOSIS — I428 Other cardiomyopathies: Secondary | ICD-10-CM | POA: Diagnosis not present

## 2020-12-05 DIAGNOSIS — I483 Typical atrial flutter: Secondary | ICD-10-CM

## 2020-12-05 DIAGNOSIS — R0609 Other forms of dyspnea: Secondary | ICD-10-CM | POA: Insufficient documentation

## 2020-12-05 DIAGNOSIS — I340 Nonrheumatic mitral (valve) insufficiency: Secondary | ICD-10-CM | POA: Diagnosis not present

## 2020-12-05 LAB — BASIC METABOLIC PANEL
Anion gap: 7 (ref 5–15)
BUN: 12 mg/dL (ref 6–20)
CO2: 24 mmol/L (ref 22–32)
Calcium: 8.9 mg/dL (ref 8.9–10.3)
Chloride: 105 mmol/L (ref 98–111)
Creatinine, Ser: 1.23 mg/dL (ref 0.61–1.24)
GFR, Estimated: >60 mL/min (ref >60)
Glucose, Bld: 95 mg/dL (ref 70–99)
Potassium: 4.4 mmol/L (ref 3.5–5.1)
Sodium: 136 mmol/L (ref 135–145)

## 2020-12-05 LAB — BRAIN NATRIURETIC PEPTIDE: B Natriuretic Peptide: 325.7 pg/mL — ABNORMAL HIGH (ref 0.0–100.0)

## 2020-12-05 MED ORDER — LOSARTAN POTASSIUM 100 MG PO TABS: ORAL_TABLET | ORAL | 3 refills | Status: DC

## 2020-12-05 NOTE — Patient Instructions (Signed)
Increase Losartan to 100 mg (1 tab) in AM and 50 mg (1/2 tab) in PM  Labs done today, your results will be available in MyChart, we will contact you for abnormal readings.  Your physician recommends that you return for lab work in: 10 days  You have been referred to Dr Roxy Manns, his office will call you to schedule an appointment  Your physician recommends that you schedule a follow-up appointment in: 2 months  If you have any questions or concerns before your next appointment please send [place] a message through [place] or call our office at [phone].    TO LEAVE A MESSAGE FOR THE NURSE SELECT OPTION 2, PLEASE LEAVE A MESSAGE INCLUDING: . YOUR NAME . DATE OF BIRTH . CALL BACK NUMBER . REASON FOR CALL**this is important as we prioritize the call backs  [place] AS LONG AS YOU CALL BEFORE 4:00 PM  At the [facility], you and your health needs are our priority. As part of our continuing mission to provide you with exceptional heart care, we have created designated Provider Care Teams. These Care Teams include your primary Cardiologist (physician) and Advanced Practice Providers (APPs- Physician Assistants and Nurse Practitioners) who all work together to provide you with the care you need, when you need it.   You may see any of the following providers on your designated Care Team at your next follow up: Marland Kitchen Dr Glori Bickers . Dr Loralie Champagne . Dr Vickki Muff . Darrick Grinder, NP . Lyda Jester, [place] . Audry Riles, PharmD   Please be sure to bring in all your medications bottles to every appointment.

## 2020-12-05 NOTE — Progress Notes (Signed)
PCP: [name], MD Cardiology: Dr. [name] HF Cardiology: Dr. [name]  60 y.o. 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 [name] for evaluation of CHF. 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  Cardiolite in 5/16 showed no ischemia but possible apical infarction.  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  PYP scan was read as equivocal in 5/20 but probably not suggestive of transthyretin amyloidosis.   TEE was done in 6/20 to assess severity of MR.  EF was 50% with mild mitral valve prolapse and moderate MR.  RHC/LHC showed nonobstructive CAD, pulmonary venous hypertension, relatively controlled filling pressures. 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   Echo in 3/21 showed EF 55-60%, normal RV, severe LAE, moderate-severe MR.    Patient's BP has been poorly controlled over time.  He has not been consistent with medication compliance.  Renal artery dopplers were unremarkable in 6/16.   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   He had atrial flutter in 9/21 but has been back in NSR.   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  LHC/RHC in 2/22 showed 50% mLAD stenosis, 80% dLAD, prominent v-waves on PCWP tracing. High resolution CT chest in 3/22 showed numerous mediastinal and axillary lymph nodes,  consider sarcoidosis versus lymphoproliferative disease.   Patient returns for followup of CHF.  No palpitations.  Dyspnea only with heavy exertion, seems to be doing better on higher Lasix dose.  No chest pain.  BP is elevated today, he has taken all his meds.    ECG (personally reviewed): NSR, borderline LVH  Patient presents for followup of CHF.  BP is controlled.  Weight is stable.   Labs (5/20): K 3.5, creatinine 1.23 Labs (6/20): Urine immunofixation negative, K 4.2, creatinine 1.36 Labs (7/20): creatinine 1.43 Labs (9/20): uric acid 7.5, LDL 67 Labs (1/21): K 3.9, creatinine 1.33, BNP 388, ACE level low Labs (5/21): LDL 57 Labs (9/21): BNP 804, K 4.1, creatinine 1.42 Labs (12/21): K 4.8, creatinine 1.3 Labs (2/22): K 4.7, creatinine 1.07, ACE level 46 (normal)  PMH: 1. Gout 2. H/o NSVT/PVCs: Zio patch 1/22 with rare PVCs, few short NSVT runs.  3. 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  Seen on multiple echoes, first in 2015.  4. HTN: Renal artery dopplers in 6/16 were unremarkable. Sleep study about 2 years ago was negative per patient's report.  5. Mitral regurgitation: TEE in 2015 with mild to moderate MR.  - Echo (2/20): Moderate MR.  - Echo (5/20): Possible severe MR - TEE (6/20): Mild mitral valve prolapse with moderate MR.  - Echo (3/21): Moderate to severe MR.  - TEE (9/21): moderate-severe MR with prolapse of A2 (eccentric MR).  - Cardiac MRI (1/22) with severe MR (regurgitant fraction 54%, regurgitant volume 83 cc).  6. 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  - Cardiolite (  5/16): Possible apical infarct, no ischemia.  - Echo (7/19): EF 50-55%.  - Echo (2/20): EF 50-55%, moderate MR, TV fibroelastoma.  - PYP scan (5/20): Grade 1 visually, H/CL 1.23.  Read as equivocal but likely negative for TTR amyloidosis.  - Echo (5/20): EF 40-45%, mildly dilated LV with mild LVH, normal RV size  and systolic function, possible severe MR, moderate TR, 1 cm nodule on TV may be fibroelastoma, PASP 79 mmHg.  - TEE (6/20): EF 50%, mild LV dilation, mild LVH, moderate LAE, tricuspid valve mass likely fibroelastoma, mild MVP with moderate MR.  - LHC/RHC (6/20): 50% LAD, 30% pRCA; mean RA 4, PA 55/21 mean 34, mean PCWP 17, CI 2.66, PVR 2.8 WU.  - Cardiac MRI (8/20): EF 53%, mild LVH, inferoseptal/inferior mid-wall LGE (?cardiac sarcoidosis, does not look like amyloidosis), mild RV dilation with RVEF 54%, moderate eccentric MR, severe LAE. - Echo (3/21):  EF 55-60%, normal RV, severe LAE, moderate-severe MR. 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  - Cardiac MRI (1/22): moderate LV dilation with EF 45%, diffuse hypokinesis worse in the basal-mid inferior and inferoseptal walls, moderate RV dilation with EF 54%, severe MR with regurgitant fraction 54% and regurgitant volume 83 cc, mid-wall LGE in the basal inferoseptal and inferior walls concerning for possible cardiac sarcoidosis.  - RHC/LHC (2/22): 50% mid LAD, 80% dLAD; mean RA 6, PA 61/22 mean 39, mean PCWP 26 with v waves to 45, CI 2.69, PVR 2.13 WU.  7. Atrial flutter: Noted first in 9/21.  8. CAD: LHC (2/22) with 50% mid LAD, 80% distal LAD stenoses.  Medical management.  9. CT chest (3/22): Numerous mediastinal and axillary lymph nodes, some of which are enlarged. Findings can be seen with sarcoid or a lymphoproliferative disorder.  FH:  Father with MI at 40, brother with MI at 74, mother with CHF, MI.  HTN in multiple family members.   Social History   Socioeconomic History   Marital status: Divorced    Spouse name: Not on file   Number of children: Not on file   Years of education: Not on file   Highest education level: Not on file  Occupational History   Not on file  Tobacco Use   Smoking status: Never Smoker    Smokeless tobacco: Never Used  Vaping Use   Vaping Use: Never used  Substance and Sexual Activity   Alcohol use: No   Drug use: No   Sexual activity: Yes  Other Topics Concern   Not on file  Social History Narrative   Not on file   Social Determinants of Health   Financial Resource Strain: Not on file  Food Insecurity: Not on file  Transportation Needs: Not on file  Physical Activity: Not on file  Stress: Not on file  Social Connections: Not on file  Intimate Partner Violence: Not on file   ROS: All systems reviewed and negative except as per HPI.   Current Outpatient Medications  Medication Sig Dispense Refill   acetaminophen (TYLENOL) 650 MG CR tablet Take 1,300 mg by mouth every 8 (eight) hours as needed for pain.     allopurinol (ZYLOPRIM) 100 MG tablet Take 1 tablet (100 mg total) by mouth 2 (two) times daily. 60 tablet 6   apixaban (ELIQUIS) 5 MG TABS tablet Take 1 tablet (5 mg total) by mouth 2 (two) times daily. 60 tablet 0  bisacodyl (DULCOLAX) 5 MG EC tablet Take 1 tablet (5 mg total) by mouth daily as needed for moderate constipation. 4 tablet 0   carvedilol (COREG) 25 MG tablet Take 1 tablet (25 mg total) by mouth 2 (two) times daily. 180 tablet 3   cetirizine (ZYRTEC) 10 MG tablet Take 10 mg by mouth daily as needed for allergies.     dicyclomine (BENTYL) 20 MG tablet Take 1 tablet (20 mg total) by mouth 2 (two) times daily. 20 tablet 0   docusate sodium (COLACE) 100 MG capsule Take 100 mg by mouth daily as needed for mild constipation.     Ensure Max Protein (ENSURE MAX PROTEIN) LIQD Take 330 mLs (11 oz total) by mouth daily. 330 mL 3   furosemide (LASIX) 20 MG tablet Take 2 tablets (40 mg total) by mouth daily with breakfast AND 1 tablet (20 mg total) every evening. 60 tablet 0   gabapentin (NEURONTIN) 300 MG capsule Take 300 mg by mouth daily as needed (nerve pain).     hydrALAZINE (APRESOLINE) 100 MG tablet Take 1 tablet (100 mg total) by mouth  3 (three) times daily. 270 tablet 3   isosorbide mononitrate (IMDUR) 60 MG 24 hr tablet Take 1.5 tablets (90 mg total) by mouth daily. 135 tablet 3   Multiple Vitamin (MULTIVITAMIN WITH MINERALS) TABS tablet Take 1 tablet by mouth daily. 30 tablet 0   ondansetron (ZOFRAN ODT) 8 MG disintegrating tablet Take 1 tablet (8 mg total) by mouth every 8 (eight) hours as needed for nausea or vomiting. 20 tablet 0   polyethylene glycol powder (GLYCOLAX/MIRALAX) 17 GM/SCOOP powder Take 17 g by mouth daily as needed for mild constipation, moderate constipation or severe constipation.     potassium chloride (KLOR-CON) 10 MEQ tablet Take 10 mEq by mouth daily.     rosuvastatin (CRESTOR) 10 MG tablet Take 1 tablet (10 mg total) by mouth daily. 90 tablet 3   spironolactone (ALDACTONE) 50 MG tablet Take 1 tablet (50 mg total) by mouth daily. 90 tablet 3   tizanidine (ZANAFLEX) 2 MG capsule Take 2 mg by mouth 3 (three) times daily as needed for muscle spasms.     amLODipine (NORVASC) 10 MG tablet TAKE 1 TABLET (10 MG TOTAL) BY MOUTH DAILY WITH LUNCH (NOON) 30 tablet 8   losartan (COZAAR) 100 MG tablet Take 1 tablet (100 mg total) by mouth in the morning AND 0.5 tablets (50 mg total) every evening. 135 tablet 3   No current facility-administered medications for this encounter.   BP (!) 150/90    Pulse 65    Wt 107 kg (235 lb 12.8 oz)    SpO2 100%    BMI 31.11 kg/m  General: NAD Neck: JVP 8 cm, no thyromegaly or thyroid nodule.  Lungs: Clear to auscultation bilaterally with normal respiratory effort. CV: Nondisplaced PMI.  Heart regular S1/S2, no S3/S4, 3/6 HSM apex.  No peripheral edema.  No carotid bruit.  Normal pedal pulses.  Abdomen: Soft, nontender, no hepatosplenomegaly, no distention.  Skin: Intact without lesions or rashes.  Neurologic: Alert and oriented x 3.  Psych: Normal affect. Extremities: No clubbing or cyanosis.  HEENT: Normal.   Assessment/Plan: 1. HTN: Negative renal artery  dopplers and sleep study in past.  On multiple agents. Historically poorly controlled.   - Unable to tolerate Entresto due to cough, increase losartan to 100 qam/50 qpm.  BMET today and in 10 days.   - Continue amlodipine 10 mg daily.  - Continue  hydralazine 100 mg tid and Imdur 90 mg daily.  - Continue spironolactone 50 mg daily.  - Continue Coreg 25 mg bid.  2. 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  Echo in 5/20 showed EF 40-45% with normal RV, possible severe mitral regurgitation, and severe pulmonary hypertension.  PYP scan in 5/20 was read as equivocal, but I suspect this is not suggestive of transthyretin amyloidosis. TEE in 5/20 showed EF 50%, mild LV dilation, moderate MR.  RHC/LHC showed mildly elevated PCWP, pulmonary venous hypertension, and nonobstructive CAD.  Cardiac MRI in 8/20 showed EF 53% and had an LGE pattern that was suggestive of possible cardiac sarcoidosis (not amyloidosis).  Echo in 3/21 showed EF up to 55-60%.  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  Cardiac MRI in 1/22 with moderate LV dilation with EF 45%, diffuse hypokinesis worse in the basal-mid inferior and inferoseptal walls, moderate RV dilation with EF 54%, severe MR with regurgitant fraction 54% and regurgitant volume 83 cc, mid-wall LGE in the basal inferoseptal and inferior walls concerning for possible cardiac sarcoidosis. LHC/RHC showed moderate CAD without interventional target, elevated PCWP with prominent v-waves.  He is not significantly volume overloaded on exam, NYHA class II symptoms.  - Continue Lasix 40 mg qam/20 qpm. BMET today.   - Increasing losartan to 100 qam/50 qpm as above.   - Continue Coreg 25 mg bid.  - Continue spironolactone 50 mg daily.  - Continue hydralazine 100 mg tid and Imdur 90 mg daily as above.  - Given concern  for possible cardiac sarcoidosis, he has been set up for cardiac PET to be done at [facility] on 12/28/20 to assess for cardiac sarcoidosis.  High resolution CT chest from 3/22 shows enlarged mediastinal and axillary lymph nodes (could be seen in sarcoidosis versus lymphoproliferative disease) => could consider biopsy in future.  3. Mitral regurgitation: ?severe on 5/20 TTE but TEE in 6/20 showed mild mitral valve prolapse with only moderate MR.  Cardiac MRI in 8/20 also suggested moderate MR. TTE in 3/21 suggested moderate-severe MR. TEE in 9/21 with moderate-severe MR, A2 prolapse.  Repeat cardiac MRI in 1/22 clearly showed severe MR with regurgitant fraction 54% and regurgitant volume 83 cc.  He has a prominent murmur on exam. He has NYHA class II exertional dyspnea.  - I would like him evaluated for MV repair, will refer to Dr. Roxy Manns => cath has been done, will be seen at [facility] after cardiac PET completed.   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  5. CAD: Moderate CAD without interventional target on 2/22 cath, no chest pain.  - continue Crestor, good lipids in 5/21.  - He is on apixaban so no ASA.  6. Atrial flutter: In NSR today.    - Continue Eliquis 5 mg bid.  - Consider atrial flutter ablation if flutter returns.  Followup in 2 months.   Loralie Champagne 12/05/2020

## 2020-12-15 ENCOUNTER — Ambulatory Visit (HOSPITAL_COMMUNITY)
Admission: RE | Admit: 2020-12-15 | Discharge: 2020-12-15 | Disposition: A | Discharge status: Home / Self Care | Payer: Medicaid Other | Source: Ambulatory Visit | Attending: Cardiology | Admitting: Cardiology

## 2020-12-15 ENCOUNTER — Other Ambulatory Visit: Payer: Self-pay

## 2020-12-15 DIAGNOSIS — I5042 Chronic combined systolic (congestive) and diastolic (congestive) heart failure: Secondary | ICD-10-CM | POA: Diagnosis not present

## 2020-12-15 LAB — BASIC METABOLIC PANEL
Anion gap: 9 (ref 5–15)
BUN: 22 mg/dL — ABNORMAL HIGH (ref 6–20)
CO2: 23 mmol/L (ref 22–32)
Calcium: 9.1 mg/dL (ref 8.9–10.3)
Chloride: 104 mmol/L (ref 98–111)
Creatinine, Ser: 1.32 mg/dL — ABNORMAL HIGH (ref 0.61–1.24)
GFR, Estimated: >60 mL/min (ref >60)
Glucose, Bld: 100 mg/dL — ABNORMAL HIGH (ref 70–99)
Potassium: 4.4 mmol/L (ref 3.5–5.1)
Sodium: 136 mmol/L (ref 135–145)

## 2021-01-01 ENCOUNTER — Encounter: Payer: Medicaid Other | Admitting: Thoracic Surgery (Cardiothoracic Vascular Surgery)

## 2021-01-11 ENCOUNTER — Encounter: Payer: Self-pay | Admitting: Cardiothoracic Surgery

## 2021-01-11 ENCOUNTER — Other Ambulatory Visit: Payer: Self-pay

## 2021-01-11 ENCOUNTER — Other Ambulatory Visit: Payer: Self-pay | Admitting: Cardiothoracic Surgery

## 2021-01-11 ENCOUNTER — Institutional Professional Consult (permissible substitution) (INDEPENDENT_AMBULATORY_CARE_PROVIDER_SITE_OTHER): Payer: Medicaid Other | Admitting: Cardiothoracic Surgery

## 2021-01-11 VITALS — BP 176/108 | HR 81 | Temp 97.9°F | Ht 73.0 in | Wt 230.6 lb

## 2021-01-11 DIAGNOSIS — I340 Nonrheumatic mitral (valve) insufficiency: Secondary | ICD-10-CM | POA: Diagnosis not present

## 2021-01-11 NOTE — Progress Notes (Signed)
[address]       [place] [address]             [place] REPORT  Referring Provider is Larey Dresser, MD Primary Cardiologist is Fransico Him, MD PCP is [name], MD  Chief Complaint  Patient presents with  . Mitral Regurgitation    New patient consultation    HPI:  60 year old man with a strong family history of cardiovascular disease has been followed for several years for heart failure, mitral regurgitation, tricuspid valve fibroblastoma, and coronary artery disease.  He also carries a diagnosis of sarcoid and was recently referred to [facility] for cardiac PET scanning to evaluate sarcoid.  He is intolerant of Entresto but is otherwise reasonably well managed medically although he continues to be on the hypertensive side.  He denies chest pain currently and has no lower extremity edema.  He had 1 documented bout of atrial flutter and is now anticoagulated.  He is referred for consideration of coronary bypass grafting, mitral valve surgery, tricuspid valve surgery and possible atrial ablation.  Past Medical History:  Diagnosis Date  . Benign essential HTN 01/19/2014   Renal Artery [place] 6/16:  No RAS, No AAA  . Cardiomyopathy, dilated, nonischemic (HCC)    EF 45-50% bye echo 08/2015 and EF 50-55% by echo 10/2017  . Chronic combined systolic and diastolic CHF (congestive heart failure) ([place]) 01/21/2014  . 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 Gout   . Mitral valve regurgitation    mild to moderate MR with MVP of ANMVL by echo 2019  . PVC's (premature ventricular contractions)    nonsustained VT as well as PVCs - no ICD indicated due to EF 40-45%  . Sleep apnea    pending second sleep study   . 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 ABDOMINAL SURGERY  at birth  . BUBBLE STUDY  03/16/2019   Procedure: BUBBLE STUDY;  Surgeon: Larey Dresser, MD;  Location: [place];  Service: Cardiovascular;;  . CARDIOVERSION N/A 06/08/2020   Procedure: CARDIOVERSION;  Surgeon: Larey Dresser, MD;  Location: [facility] ENDOSCOPY;  Service: Cardiovascular;  Laterality: N/A;  . RIGHT/LEFT HEART CATH AND CORONARY ANGIOGRAPHY N/A 03/16/2019   Procedure: RIGHT/LEFT HEART CATH AND CORONARY ANGIOGRAPHY;  Surgeon: Larey Dresser, MD;  Location: [place] CV LAB;  Service: Cardiovascular;  Laterality: N/A;  . RIGHT/LEFT HEART CATH AND CORONARY ANGIOGRAPHY N/A 11/09/2020   Procedure: RIGHT/LEFT HEART CATH AND CORONARY ANGIOGRAPHY;  Surgeon: Larey Dresser, MD;  Location: [place] CV LAB;  Service: Cardiovascular;  Laterality: N/A;  . TEE WITHOUT CARDIOVERSION N/A 01/25/2014   Procedure: TRANSESOPHAGEAL ECHOCARDIOGRAM (TEE);  Surgeon: [name], MD;  Location: [place];  Service: Cardiovascular;  Laterality: N/A;  . TEE WITHOUT CARDIOVERSION N/A 09/14/2014   Procedure: TRANSESOPHAGEAL ECHOCARDIOGRAM (TEE);  Surgeon: Sueanne Margarita, MD;  Location: [place];  Service: Cardiovascular;  Laterality: N/A;  . TEE WITHOUT CARDIOVERSION N/A 03/16/2019   Procedure: TRANSESOPHAGEAL ECHOCARDIOGRAM (TEE);  Surgeon: Larey Dresser, MD;  Location[facility] ENDOSCOPY;  Service: Cardiovascular;  Laterality: N/A;  . TEE WITHOUT CARDIOVERSION N/A 06/08/2020   Procedure: TRANSESOPHAGEAL ECHOCARDIOGRAM (TEE);  Surgeon: Larey Dresser, MD;  Location: [facility] ENDOSCOPY;  Service: Cardiovascular;  Laterality: N/A;    Family History  Problem Relation Age of Onset  . Heart Problems Mother   . Stroke Mother   . Heart failure Mother   . Hypertension Mother   . Heart Problems Father   . Heart attack Father   . Hypertension Father   . Heart Problems Brother   . Heart attack Brother   . Hypertension  Brother   . Colon cancer Neg Hx   . Esophageal cancer Neg Hx   . Stomach cancer Neg Hx   . Colon polyps Neg Hx   . Rectal cancer Neg Hx     Social History   Socioeconomic History  . Marital status: Divorced    Spouse name: Not on file  . Number of children: Not on file  . Years of education: Not on file  . Highest education level: Not on file  Occupational History  . Not on file  Tobacco Use  . Smoking status: Never Smoker  . Smokeless tobacco: Never Used  Vaping Use  . Vaping Use: Never used  Substance and Sexual Activity  . Alcohol use: No  . Drug use: No  . Sexual activity: Yes  Other Topics Concern  . Not on file  Social History Narrative  . Not on file   Social Determinants of Health   Financial Resource Strain: Not on file  Food Insecurity: Not on file  Transportation Needs: Not on file  Physical Activity: Not on file  Stress: Not on file  Social Connections: Not on file  Intimate Partner Violence: Not on file    Current Outpatient Medications  Medication Sig Dispense Refill  . acetaminophen (TYLENOL) 650 MG CR tablet Take 1,300 mg by mouth every 8 (eight) hours as needed for pain.    Marland Kitchen allopurinol (ZYLOPRIM) 100 MG tablet Take 1 tablet (100 mg total) by mouth 2 (two) times daily. 60 tablet 6  . amLODipine (NORVASC) 10 MG tablet TAKE 1 TABLET (10 MG TOTAL) BY MOUTH DAILY WITH LUNCH (NOON) 30 tablet 8  . apixaban (ELIQUIS) 5 MG TABS tablet Take 1 tablet (5 mg total) by mouth 2 (two) times daily. 60 tablet 0  . bisacodyl (DULCOLAX) 5 MG EC tablet Take 1 tablet (5 mg total) by mouth daily as needed for moderate constipation. 4 tablet 0  . carvedilol (COREG) 25 MG tablet Take 1 tablet (25 mg total) by mouth 2 (two) times daily. 180 tablet 3  . cetirizine (ZYRTEC) 10 MG tablet Take 10 mg by mouth daily as needed for allergies.    Marland Kitchen dicyclomine (BENTYL) 20 MG tablet Take 1 tablet (20 mg total) by mouth 2 (two) times daily. 20 tablet 0  . docusate sodium (COLACE)  100 MG capsule Take 100 mg by mouth daily as needed for mild constipation.    . Ensure Max Protein (ENSURE MAX PROTEIN) LIQD Take 330 mLs (11 oz total) by mouth daily. 330 mL 3  . furosemide (LASIX) 20 MG tablet Take 2 tablets (40 mg total) by mouth daily with breakfast AND 1 tablet (20 mg total) every evening. 60 tablet 0  . gabapentin (NEURONTIN) 300 MG capsule Take 300 mg by mouth daily as needed (nerve pain).    . hydrALAZINE (APRESOLINE) 100 MG tablet Take 1 tablet (100 mg total) by mouth 3 (three) times daily. 270 tablet 3  . isosorbide mononitrate (IMDUR) 60 MG 24 hr tablet Take 1.5 tablets (90  mg total) by mouth daily. 135 tablet 3  . losartan (COZAAR) 100 MG tablet Take 1 tablet (100 mg total) by mouth in the morning AND 0.5 tablets (50 mg total) every evening. 135 tablet 3  . Multiple Vitamin (MULTIVITAMIN WITH MINERALS) TABS tablet Take 1 tablet by mouth daily. 30 tablet 0  . ondansetron (ZOFRAN ODT) 8 MG disintegrating tablet Take 1 tablet (8 mg total) by mouth every 8 (eight) hours as needed for nausea or vomiting. 20 tablet 0  . polyethylene glycol powder (GLYCOLAX/MIRALAX) 17 GM/SCOOP powder Take 17 g by mouth daily as needed for mild constipation, moderate constipation or severe constipation.    . potassium chloride (KLOR-CON) 10 MEQ tablet Take 10 mEq by mouth daily.    . rosuvastatin (CRESTOR) 10 MG tablet Take 1 tablet (10 mg total) by mouth daily. 90 tablet 3  . spironolactone (ALDACTONE) 50 MG tablet Take 1 tablet (50 mg total) by mouth daily. 90 tablet 3  . tizanidine (ZANAFLEX) 2 MG capsule Take 2 mg by mouth 3 (three) times daily as needed for muscle spasms.     No current facility-administered medications for this visit.    Allergies  Allergen Reactions  . Entresto [Sacubitril-Valsartan] Cough  . Benazepril Cough      Review of Systems:   General:  No weight change, good energy level  Cardiac:  As per HPI  Respiratory:  Denies asthma; +sleep apnea  GI:   No  abdominal pain or bleeding  GU:   No prostate disease, mild hypertensive kidney disease  Vascular:  No claudication or DVTs  Neuro:   Denies strokes or TIAs  Musculoskeletal: Negative  Skin:   Negative  Psych:   Negative  Eyes:   Negative  ENT:   Negative  Hematologic:  On Eliquis for atrial fibrillation  Endocrine:  History of gout no diabetes     Physical Exam:   BP (!) 176/108 (BP Location: Right Arm, Patient Position: Sitting, Cuff Size: Large)   Pulse 81   Temp 97.9 F (36.6 C)   Ht 6\' 1"  (1.854 m)   Wt 104.6 kg   SpO2 97% Comment: RA  BMI 30.42 kg/m   General:   well-appearing  HEENT:  Unremarkable  Neck:   no JVD, no bruits, no adenopathy   Chest:   clear to auscultation, symmetrical breath sounds, no wheezes, no rhonchi   CV:   RRR, 3/6 systolic murmur radiating to the back  Abdomen:  soft, non-tender, no masses   Extremities:  warm, well-perfused, pulses intact, no LE edema  Rectal/GU  Deferred  Neuro:   Grossly non-focal and symmetrical throughout  Skin:   Clean and dry, no rashes, no breakdown   Diagnostic Tests:  I personally reviewed his available imaging studies including echocardiogram from September 2021 and left heart catheterization from February of this year.  I agree with their interpretation.  These demonstrate single-vessel coronary artery disease and severe mitral regurgitation with anterior leaflet prolapse   Impression: 60 year old man with strong family history of heart disease who has been followed for severe mitral regurgitation, coronary artery disease and heart failure.  Plan:  2D echocardiogram in the next month to follow-up severe mitral regurgitation and LV function Keep appointment with Dr. [name] on 02/09/2021 for interpretation of cardiac PET scan performed at [facility] in this office in early June for consideration of surgery   I spent in excess of 30 minutes during the conduct of this office consultation and >50% of  this  time involved direct face-to-face encounter with the patient for counseling and/or coordination of their care.          Level 3 Office Consult = 40 minutes         Level 4 Office Consult = 60 minutes         Level 5 Office Consult = 80 minutes  B.  Murvin Natal, MD 01/11/2021 1:30 PM

## 2021-01-17 ENCOUNTER — Telehealth (HOSPITAL_COMMUNITY): Payer: Self-pay | Admitting: *Deleted

## 2021-01-17 NOTE — Telephone Encounter (Signed)
Received results from pt's cardiac PET scan done at [facility] on 12/28/20, per Dr [name]: "abnormal metabolism may be due to cardiac sarcoidosis, refer to Dr Hiram Gash at [facility]"  Pt is aware and agreeable, referral form and records faxed to [facility] at [phone]

## 2021-01-22 ENCOUNTER — Other Ambulatory Visit (HOSPITAL_COMMUNITY): Payer: Self-pay | Admitting: Cardiology

## 2021-01-22 MED ORDER — FUROSEMIDE 20 MG PO TABS: ORAL_TABLET | ORAL | 0 refills | Status: DC

## 2021-01-23 ENCOUNTER — Ambulatory Visit (HOSPITAL_COMMUNITY): Payer: Medicaid Other

## 2021-01-24 ENCOUNTER — Other Ambulatory Visit: Payer: Self-pay

## 2021-01-24 ENCOUNTER — Ambulatory Visit (HOSPITAL_COMMUNITY)
Admission: RE | Admit: 2021-01-24 | Discharge: 2021-01-24 | Disposition: A | Discharge status: Home / Self Care | Payer: Medicaid Other | Source: Ambulatory Visit | Attending: Cardiothoracic Surgery | Admitting: Cardiothoracic Surgery

## 2021-01-24 DIAGNOSIS — I081 Rheumatic disorders of both mitral and tricuspid valves: Secondary | ICD-10-CM | POA: Diagnosis not present

## 2021-01-24 DIAGNOSIS — I340 Nonrheumatic mitral (valve) insufficiency: Secondary | ICD-10-CM

## 2021-01-24 DIAGNOSIS — I110 Hypertensive heart disease with heart failure: Secondary | ICD-10-CM | POA: Insufficient documentation

## 2021-01-24 DIAGNOSIS — I509 Heart failure, unspecified: Secondary | ICD-10-CM | POA: Diagnosis not present

## 2021-01-24 LAB — ECHOCARDIOGRAM COMPLETE
Area-P 1/2: 4.06 cm2
MV M vel: 5.34 m/s
MV Peak grad: 114.1 mmHg
Radius: 0.8 cm
S' Lateral: 4.7 cm
Single Plane A4C EF: 42.1 %

## 2021-01-24 NOTE — Progress Notes (Signed)
  Echocardiogram 2D Echocardiogram has been performed.  Jennette Dubin 01/24/2021, 4:11 PM

## 2021-02-09 ENCOUNTER — Telehealth: Payer: Self-pay | Admitting: Pulmonary Disease

## 2021-02-09 ENCOUNTER — Other Ambulatory Visit: Payer: Self-pay

## 2021-02-09 ENCOUNTER — Encounter (HOSPITAL_COMMUNITY): Payer: Self-pay | Admitting: Cardiology

## 2021-02-09 ENCOUNTER — Ambulatory Visit (HOSPITAL_COMMUNITY)
Admission: RE | Admit: 2021-02-09 | Discharge: 2021-02-09 | Disposition: A | Discharge status: Home / Self Care | Payer: Medicaid Other | Source: Ambulatory Visit | Attending: Cardiology | Admitting: Cardiology

## 2021-02-09 VITALS — BP 150/90 | HR 71 | Wt 233.6 lb

## 2021-02-09 DIAGNOSIS — D849 Immunodeficiency, unspecified: Secondary | ICD-10-CM | POA: Insufficient documentation

## 2021-02-09 DIAGNOSIS — Z7901 Long term (current) use of anticoagulants: Secondary | ICD-10-CM | POA: Insufficient documentation

## 2021-02-09 DIAGNOSIS — I110 Hypertensive heart disease with heart failure: Secondary | ICD-10-CM | POA: Insufficient documentation

## 2021-02-09 DIAGNOSIS — I5042 Chronic combined systolic (congestive) and diastolic (congestive) heart failure: Secondary | ICD-10-CM | POA: Diagnosis not present

## 2021-02-09 DIAGNOSIS — I341 Nonrheumatic mitral (valve) prolapse: Secondary | ICD-10-CM | POA: Insufficient documentation

## 2021-02-09 DIAGNOSIS — I2510 Atherosclerotic heart disease of native coronary artery without angina pectoris: Secondary | ICD-10-CM | POA: Diagnosis not present

## 2021-02-09 DIAGNOSIS — I428 Other cardiomyopathies: Secondary | ICD-10-CM | POA: Diagnosis not present

## 2021-02-09 DIAGNOSIS — I4892 Unspecified atrial flutter: Secondary | ICD-10-CM | POA: Diagnosis not present

## 2021-02-09 DIAGNOSIS — D869 Sarcoidosis, unspecified: Secondary | ICD-10-CM

## 2021-02-09 DIAGNOSIS — Z79899 Other long term (current) drug therapy: Secondary | ICD-10-CM | POA: Diagnosis not present

## 2021-02-09 DIAGNOSIS — Z8249 Family history of ischemic heart disease and other diseases of the circulatory system: Secondary | ICD-10-CM | POA: Diagnosis not present

## 2021-02-09 DIAGNOSIS — I340 Nonrheumatic mitral (valve) insufficiency: Secondary | ICD-10-CM | POA: Insufficient documentation

## 2021-02-09 LAB — LIPID PANEL
Cholesterol: 99 mg/dL (ref 0–200)
HDL: 41 mg/dL (ref >40)
LDL Cholesterol: 48 mg/dL (ref 0–99)
Total CHOL/HDL Ratio: 2.4 RATIO
Triglycerides: 51 mg/dL (ref <150)
VLDL: 10 mg/dL (ref 0–40)

## 2021-02-09 LAB — BASIC METABOLIC PANEL
Anion gap: 6 (ref 5–15)
BUN: 10 mg/dL (ref 6–20)
CO2: 27 mmol/L (ref 22–32)
Calcium: 9.2 mg/dL (ref 8.9–10.3)
Chloride: 104 mmol/L (ref 98–111)
Creatinine, Ser: 1.3 mg/dL — ABNORMAL HIGH (ref 0.61–1.24)
GFR, Estimated: >60 mL/min (ref >60)
Glucose, Bld: 88 mg/dL (ref 70–99)
Potassium: 4.2 mmol/L (ref 3.5–5.1)
Sodium: 137 mmol/L (ref 135–145)

## 2021-02-09 LAB — BRAIN NATRIURETIC PEPTIDE: B Natriuretic Peptide: 601.5 pg/mL — ABNORMAL HIGH (ref 0.0–100.0)

## 2021-02-09 MED ORDER — CARVEDILOL 25 MG PO TABS
37.5000 mg | ORAL_TABLET | Freq: Two times a day (BID) | ORAL | 3 refills | Status: DC
Start: 2021-02-09 — End: 2022-01-30

## 2021-02-09 MED ORDER — FUROSEMIDE 20 MG PO TABS
40.0000 mg | ORAL_TABLET | Freq: Two times a day (BID) | ORAL | 3 refills | Status: DC
Start: 2021-02-09 — End: 2021-05-08

## 2021-02-09 NOTE — Telephone Encounter (Signed)
-----   Message from Garner Nash, DO sent at 02/09/2021 11:59 AM EDT ----- Regarding: new consult  Please schedule in next available nodule/mass/adenopathy slot.   Diagnosis adenopathy  Discuss procedure.   Thanks  Leory Plowman      ----- Message ----- From: Larey Dresser, MD Sent: 02/09/2021   9:54 AM EDT To: Garner Nash, DO  This is ?sarcoid patient who needs biopsy

## 2021-02-09 NOTE — Telephone Encounter (Signed)
Called and left message on pt vm to call back to schedule consult with Dr. Valeta Harms - use lung nodule slot -pr

## 2021-02-09 NOTE — Patient Instructions (Addendum)
EKG done today.  Labs done today. We will contact you only if your labs are abnormal.  INCREASE Carvedilol to 37.5mg  (1 & 1/2 tablets) by mouth 2 times daily.  INCREASE Lasix to 40mg  (2 tablets) by mouth 2 times daily.  No other medication changes were made. Please continue all current medications as prescribed.  [facility] will be in contact with you regarding scheduling an appointment.   You have been referred to [facility]. They will contact you to schedule an appointment.   Your physician recommends that you schedule a follow-up appointment in: 10 days for a lab only appointment and in 1 month for an appointment with Dr. [name]   If you have any questions or concerns before your next appointment please send [place] a message through [facility] or call our office at [phone].    TO LEAVE A MESSAGE FOR THE NURSE SELECT OPTION 2, PLEASE LEAVE A MESSAGE INCLUDING: . YOUR NAME . DATE OF BIRTH . CALL BACK NUMBER . REASON FOR CALL**this is important as we prioritize the call backs  YOU WILL RECEIVE A CALL BACK THE SAME DAY AS LONG AS YOU CALL BEFORE 4:00 PM   Do the following things EVERYDAY: 1) Weigh yourself in the morning before breakfast. Write it down and keep it in a log. 2) Take your medicines as prescribed 3) Eat low salt foods--Limit salt (sodium) to 2000 mg per day.  4) Stay as active as you can everyday 5) Limit all fluids for the day to less than 2 liters   At the [facility], you and your health needs are our priority. As part of our continuing mission to provide you with exceptional heart care, we have created designated Provider Care Teams. These Care Teams include your primary Cardiologist (physician) and Advanced Practice Providers (APPs- Physician Assistants and Nurse Practitioners) who all work together to provide you with the care you need, when you need it.   You may see any of the following providers on your designated Care  Team at your next follow up: Marland Kitchen Dr Glori Bickers . Dr Loralie Champagne . Darrick Grinder, NP . Lyda Jester, PA . Audry Riles, PharmD   Please be sure to bring in all your medications bottles to every appointment.

## 2021-02-11 NOTE — Progress Notes (Signed)
PCP: [name], MD Cardiology: Dr. [name] HF Cardiology: Dr. [name]  60 y.o. 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 [name] for evaluation of CHF. 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  Cardiolite in 5/16 showed no ischemia but possible apical infarction.  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  PYP scan was read as equivocal in 5/20 but probably not suggestive of transthyretin amyloidosis.   TEE was done in 6/20 to assess severity of MR.  EF was 50% with mild mitral valve prolapse and moderate MR.  RHC/LHC showed nonobstructive CAD, pulmonary venous hypertension, relatively controlled filling pressures. 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   Echo in 3/21 showed EF 55-60%, normal RV, severe LAE, moderate-severe MR.    Patient's BP has been poorly controlled over time.  He has not been consistent with medication compliance.  Renal artery dopplers were unremarkable in 6/16.   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   He had atrial flutter in 9/21 but has been back in NSR.   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  LHC/RHC in 2/22 showed 50% mLAD stenosis, 80% dLAD, prominent v-waves on PCWP tracing. High resolution CT chest in 3/22 showed numerous mediastinal and axillary lymph nodes,  consider sarcoidosis versus lymphoproliferative disease.  Cardiac PET in 4/22 at [facility] showed EF 42%, areas of active inflammation in LV myocardial concerning for cardiac sarcoidosis.  Repeat echo in 5/22 showed EF 50-55%, moderate LV enlargement, low normal RV function, moderate RV enlargement, moderate-severe MR with dilated IVC.   Patient returns for followup of CHF.  Symptomatically stable.  Gets short of breath walking long distances but generally minimally symptomatic.  No chest pain.  No lightheadedness.  No orthopnea/PND.  BP continues to run high.   ECG (personally reviewed): NSR, LVH  Patient presents for followup of CHF.  BP is controlled.  Weight is stable.   Labs (5/20): K 3.5, creatinine 1.23 Labs (6/20): Urine immunofixation negative, K 4.2, creatinine 1.36 Labs (7/20): creatinine 1.43 Labs (9/20): uric acid 7.5, LDL 67 Labs (1/21): K 3.9, creatinine 1.33, BNP 388, ACE level low Labs (5/21): LDL 57 Labs (9/21): BNP 804, K 4.1, creatinine 1.42 Labs (12/21): K 4.8, creatinine 1.3 Labs (2/22): K 4.7, creatinine 1.07, ACE level 46 (normal) Labs (4/22): K 4.4, creatinine 1.32  PMH: 1. Gout 2. H/o NSVT/PVCs: Zio patch 1/22 with rare PVCs, few short NSVT runs.  3. 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  Seen on multiple echoes, first in 2015.  4. HTN: Renal artery dopplers in 6/16 were unremarkable. Sleep study about 2 years ago was negative per patient's report.  5. Mitral regurgitation: TEE in 2015 with mild to moderate MR.  - Echo (2/20): Moderate MR.  - Echo (5/20): Possible severe MR - TEE (6/20): Mild mitral valve prolapse with moderate MR.  - Echo (3/21): Moderate to severe MR.  - TEE (  9/21): moderate-severe MR with prolapse of A2 (eccentric MR).  - Cardiac MRI (1/22) with severe MR (regurgitant fraction 54%, regurgitant volume 83 cc).  - Echo (5/22): moderate-severe MR with prolapse A2 6. 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  - Cardiolite (5/16): Possible apical infarct, no ischemia.  - Echo (7/19): EF 50-55%.  - Echo (2/20): EF 50-55%, moderate MR, TV fibroelastoma.  - PYP scan (5/20): Grade 1 visually, H/CL 1.23.  Read as equivocal but likely negative for TTR amyloidosis.  - Echo (5/20): EF 40-45%, mildly dilated LV with mild LVH, normal RV size and systolic function, possible severe MR, moderate TR, 1 cm nodule on TV may be fibroelastoma, PASP 79 mmHg.  - TEE (6/20): EF 50%, mild LV dilation, mild LVH, moderate LAE, tricuspid valve mass likely fibroelastoma, mild MVP with moderate MR.  - LHC/RHC (6/20): 50% LAD, 30% pRCA; mean RA 4, PA 55/21 mean 34, mean PCWP 17, CI 2.66, PVR 2.8 WU.  - Cardiac MRI (8/20): EF 53%, mild LVH, inferoseptal/inferior mid-wall LGE (?cardiac sarcoidosis, does not look like amyloidosis), mild RV dilation with RVEF 54%, moderate eccentric MR, severe LAE. - Echo (3/21):  EF 55-60%, normal RV, severe LAE, moderate-severe MR. 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  - Cardiac MRI (1/22): moderate LV dilation with EF 45%, diffuse hypokinesis worse in the basal-mid inferior and inferoseptal walls, moderate RV dilation with EF 54%, severe MR with regurgitant fraction 54% and regurgitant volume 83 cc, mid-wall LGE in the basal inferoseptal and inferior walls concerning for possible cardiac sarcoidosis.  - RHC/LHC (2/22): 50% mid LAD, 80% dLAD; mean RA 6, PA 61/22 mean 39, mean PCWP 26 with v waves to 45, CI 2.69, PVR 2.13 WU.  - Cardiac PET: 4/22 at [facility] showed EF 42%, areas of active inflammation in LV myocardial concerning for cardiac sarcoidosis. - Echo (5/22): EF 50-55%, moderate LV enlargement, low normal RV function, moderate RV enlargement, moderate-severe MR with dilated IVC.  7. Atrial flutter: Noted first in 9/21.  8. CAD: LHC (2/22) with  50% mid LAD, 80% distal LAD stenoses.  Medical management.  9. CT chest (3/22): Numerous mediastinal and axillary lymph nodes, some of which are enlarged. Findings can be seen with sarcoid or a lymphoproliferative disorder.  FH:  Father with MI at 8, brother with MI at 29, mother with CHF, MI.  HTN in multiple family members.   Social History   Socioeconomic History  . Marital status: Divorced    Spouse name: Not on file  . Number of children: Not on file  . Years of education: Not on file  . Highest education level: Not on file  Occupational History  . Not on file  Tobacco Use  . Smoking status: Never Smoker  . Smokeless tobacco: Never Used  Vaping Use  . Vaping Use: Never used  Substance and Sexual Activity  . Alcohol use: No  . Drug use: No  . Sexual activity: Yes  Other Topics Concern  . Not on file  Social History Narrative  . Not on file   Social Determinants of Health   Financial Resource Strain: Not on file  Food Insecurity: Not on file  Transportation Needs: Not on file  Physical Activity: Not on file  Stress: Not on file  Social Connections: Not on file  Intimate Partner  Violence: Not on file   ROS: All systems reviewed and negative except as per HPI.   Current Outpatient Medications  Medication Sig Dispense Refill  . acetaminophen (TYLENOL) 650 MG CR tablet Take 1,300 mg by mouth every 8 (eight) hours as needed for pain.    Marland Kitchen allopurinol (ZYLOPRIM) 100 MG tablet Take 1 tablet (100 mg total) by mouth 2 (two) times daily. 60 tablet 6  . amLODipine (NORVASC) 10 MG tablet TAKE 1 TABLET (10 MG TOTAL) BY MOUTH DAILY WITH LUNCH (NOON) 30 tablet 8  . apixaban (ELIQUIS) 5 MG TABS tablet Take 1 tablet (5 mg total) by mouth 2 (two) times daily. 60 tablet 0  . bisacodyl (DULCOLAX) 5 MG EC tablet Take 1 tablet (5 mg total) by mouth daily as needed for moderate constipation. 4 tablet 0  . cetirizine (ZYRTEC) 10 MG tablet Take 10 mg by mouth daily as needed for  allergies.    Marland Kitchen dicyclomine (BENTYL) 20 MG tablet Take 1 tablet (20 mg total) by mouth 2 (two) times daily. 20 tablet 0  . docusate sodium (COLACE) 100 MG capsule Take 100 mg by mouth daily as needed for mild constipation.    . Ensure Max Protein (ENSURE MAX PROTEIN) LIQD Take 330 mLs (11 oz total) by mouth daily. 330 mL 3  . gabapentin (NEURONTIN) 300 MG capsule Take 300 mg by mouth daily as needed (nerve pain).    . hydrALAZINE (APRESOLINE) 100 MG tablet Take 1 tablet (100 mg total) by mouth 3 (three) times daily. 270 tablet 3  . isosorbide mononitrate (IMDUR) 60 MG 24 hr tablet Take 1.5 tablets (90 mg total) by mouth daily. 135 tablet 3  . losartan (COZAAR) 100 MG tablet Take 1 tablet (100 mg total) by mouth in the morning AND 0.5 tablets (50 mg total) every evening. 135 tablet 3  . Multiple Vitamin (MULTIVITAMIN WITH MINERALS) TABS tablet Take 1 tablet by mouth daily. 30 tablet 0  . ondansetron (ZOFRAN ODT) 8 MG disintegrating tablet Take 1 tablet (8 mg total) by mouth every 8 (eight) hours as needed for nausea or vomiting. 20 tablet 0  . polyethylene glycol powder (GLYCOLAX/MIRALAX) 17 GM/SCOOP powder Take 17 g by mouth daily as needed for mild constipation, moderate constipation or severe constipation.    . potassium chloride (KLOR-CON) 10 MEQ tablet Take 10 mEq by mouth daily.    . rosuvastatin (CRESTOR) 10 MG tablet Take 1 tablet (10 mg total) by mouth daily. 90 tablet 3  . spironolactone (ALDACTONE) 50 MG tablet Take 1 tablet (50 mg total) by mouth daily. 90 tablet 3  . tizanidine (ZANAFLEX) 2 MG capsule Take 2 mg by mouth 3 (three) times daily as needed for muscle spasms.    . carvedilol (COREG) 25 MG tablet Take 1.5 tablets (37.5 mg total) by mouth 2 (two) times daily. 270 tablet 3  . furosemide (LASIX) 20 MG tablet Take 2 tablets (40 mg total) by mouth 2 (two) times daily. 360 tablet 3   No current facility-administered medications for this encounter.   BP (!) 150/90   Pulse 71   Wt  106 kg (233 lb 9.6 oz)   SpO2 98%   BMI 30.82 kg/m  General: NAD Neck: JVP 8 cm, no thyromegaly or thyroid nodule.  Lungs: Clear to auscultation bilaterally with normal respiratory effort. CV: Nondisplaced PMI.  Heart regular S1/S2, no S3/S4, 2/6 HSM apex.  No peripheral edema.   Abdomen: Soft, nontender, no hepatosplenomegaly, no distention.  Skin: Intact  without lesions or rashes.  Neurologic: Alert and oriented x 3.  Psych: Normal affect. Extremities: No clubbing or cyanosis.  HEENT: Normal.   Assessment/Plan: 1. HTN: Negative renal artery dopplers and sleep study in past.  On multiple agents. Historically poorly controlled.   - Unable to tolerate Entresto due to cough, continue losartan 100 qam/50 qpm.     - Continue amlodipine 10 mg daily.  - Continue hydralazine 100 mg tid and Imdur 90 mg daily.  - Continue spironolactone 50 mg daily.  - Increase Coreg to 37.5 mg bid.  2. 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  Echo in 5/20 showed EF 40-45% with normal RV, possible severe mitral regurgitation, and severe pulmonary hypertension.  PYP scan in 5/20 was read as equivocal, but I suspect this is not suggestive of transthyretin amyloidosis. TEE in 5/20 showed EF 50%, mild LV dilation, moderate MR.  RHC/LHC showed mildly elevated PCWP, pulmonary venous hypertension, and nonobstructive CAD.  Cardiac MRI in 8/20 showed EF 53% and had an LGE pattern that was suggestive of possible cardiac sarcoidosis (not amyloidosis).  Echo in 3/21 showed EF up to 55-60%.  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  Cardiac MRI in 1/22 with moderate LV dilation with EF 45%, diffuse hypokinesis worse in the basal-mid inferior and inferoseptal walls, moderate RV dilation with EF 54%, severe MR with regurgitant fraction 54% and regurgitant volume 83  cc, mid-wall LGE in the basal inferoseptal and inferior walls concerning for possible cardiac sarcoidosis. LHC/RHC showed moderate CAD without interventional target, elevated PCWP with prominent v-waves.  Cardiac PET in 4/22 concerning for cardiac amyloidosis.  Mild volume overload, NYHA class II symptoms.  - Increase Lasix to 40 mg bid with BMET today and in 10 days.   - Continue losartan 100 qam/50 qpm (does not tolerate Entresto).   - Increase Coreg to 37.5 mg bid as above.  - Continue spironolactone 50 mg daily.  - Continue hydralazine 100 mg tid and Imdur 90 mg daily as above.  - Strong concern for cardiac sarcoidosis based on 4/22 [facility]PET at [facility] and cMRI done here.  High resolution CT chest from 3/22 shows enlarged mediastinal and axillary lymph nodes (could be seen in sarcoidosis versus lymphoproliferative disease).   3. Mitral regurgitation: ?severe on 5/20 TTE but TEE in 6/20 showed mild mitral valve prolapse with only moderate MR.  Cardiac MRI in 8/20 also suggested moderate MR. TTE in 3/21 suggested moderate-severe MR. TEE in 9/21 with moderate-severe MR, A2 prolapse.  Repeat cardiac MRI in 1/22 clearly showed severe MR with regurgitant fraction 54% and regurgitant volume 83 cc.  He has a prominent murmur on exam. He has NYHA class II exertional dyspnea.  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 Immunosuppression would significantly complicate surgical healing.   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  5. CAD: Moderate CAD without interventional target on 2/22 cath, no chest pain.  - continue Crestor, good lipids in 5/21.  - He is on apixaban so no ASA.  6. Atrial flutter: In NSR today.    - Continue Eliquis 5 mg bid.  - Consider atrial flutter ablation if flutter returns. 7. Cardiac sarcoidosis: As above, strong suspicion for cardiac sarcoidosis based  on cPET and cMRI.  He has concerning chest lymphadenopathy as well.  We do not yet have a tissue diagnosis.  As a likely separate diagnosis, he also has severe MR with mitral valve prolapse.  Coordinating treatment of these two issues is going to be a challenge, as it would be very difficult to heal from surgery while on steroids for CS.   - We need a tissue diagnosis, referring to Dr. Valeta Harms with pulmonary for bronchoscopy/biopsy.  - I will refer to the cardiac sarcoid program at [place] (Dr. Weyman Croon) to help with treatment coordination.  If sarcoid is confirmed, will need prednisone +/- MTX, but will also need mitral valve repair.  Given his age, Mitraclip is not ideal. Timing will be a challenge.   Followup in 1 month after pulmonary evaluation/biopsy, also referring to [facility] sarcoidosis clinic.   Loralie Champagne 02/11/2021

## 2021-02-13 NOTE — Telephone Encounter (Signed)
Patient scheduled with Dr. Valeta Harms on 02/21/2021-pr

## 2021-02-20 ENCOUNTER — Ambulatory Visit (HOSPITAL_COMMUNITY)
Admission: RE | Admit: 2021-02-20 | Discharge: 2021-02-20 | Disposition: A | Discharge status: Home / Self Care | Payer: Medicaid Other | Source: Ambulatory Visit | Attending: Cardiology | Admitting: Cardiology

## 2021-02-20 ENCOUNTER — Other Ambulatory Visit: Payer: Self-pay

## 2021-02-20 DIAGNOSIS — I5042 Chronic combined systolic (congestive) and diastolic (congestive) heart failure: Secondary | ICD-10-CM | POA: Insufficient documentation

## 2021-02-20 LAB — BASIC METABOLIC PANEL
Anion gap: 6 (ref 5–15)
BUN: 14 mg/dL (ref 6–20)
CO2: 25 mmol/L (ref 22–32)
Calcium: 8.8 mg/dL — ABNORMAL LOW (ref 8.9–10.3)
Chloride: 106 mmol/L (ref 98–111)
Creatinine, Ser: 1.22 mg/dL (ref 0.61–1.24)
GFR, Estimated: >60 mL/min (ref >60)
Glucose, Bld: 109 mg/dL — ABNORMAL HIGH (ref 70–99)
Potassium: 3.9 mmol/L (ref 3.5–5.1)
Sodium: 137 mmol/L (ref 135–145)

## 2021-02-21 ENCOUNTER — Encounter (INDEPENDENT_AMBULATORY_CARE_PROVIDER_SITE_OTHER): Payer: Medicaid Other | Admitting: Pulmonary Disease

## 2021-02-21 DIAGNOSIS — Z5321 Procedure and treatment not carried out due to patient leaving prior to being seen by health care provider: Secondary | ICD-10-CM

## 2021-02-21 NOTE — Patient Instructions (Signed)
Thank you for visiting Dr. Bensen Chadderdon at [facility]. Today we recommend the following: No orders of the defined types were placed in this encounter.  No orders of the defined types were placed in this encounter.  No follow-ups on file.    Please do your part to reduce the spread of COVID-19.    

## 2021-02-21 NOTE — Progress Notes (Signed)
Synopsis: Referred in June 2022 for abnormal CT chest by [name], MD  Subjective:   PATIENT ID: Daniel Joseph GENDER: male DOB: 12/13/1960, MRN: [id number]  No chief complaint on file.   This is a 60 year old gentleman, history of chronic combined systolic and diastolic heart failure, cardiomyopathy, PVCs.  Patient had work-up in the cardiology office and follows with Dr. [name].  Office note reviewed from 02/09/2021.  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  The echocardiogram completed in May revealed a systolic ejection fraction of 40 to 45%, severe MR and severe pulmonary hypertension there is an unchanged tricuspid valve mass.  A PYP scan was read as equivocal, likely not amyloidosis.  Patient underwent cardiac MRI on 122 which revealed depressed ejection fraction.  There was areas within the chest cavity that showed enlarged mediastinal and axillary lymph nodes.  Late gadolinium enhancement.  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  Patient was referred to pulmonary for consideration of tissue biopsy to confirm diagnosis of sarcoid.   Past Medical History:  Diagnosis Date   Benign essential HTN 01/19/2014   Renal Artery [place] 6/16:  No RAS, No AAA   Cardiomyopathy, dilated, nonischemic (HCC)    EF 45-50% bye echo 08/2015 and EF 50-55% by echo 10/2017   Chronic combined systolic and diastolic CHF (congestive heart failure) ([place]) 01/21/2014   Echocardiogram 5.2020    Echo 01/2019: EF 40-45, mild LVH, Gr 3 DD (restrictive filling), normal RVSF, MR may be severe, mod TR, 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, PASP 79   Gout    Mitral valve regurgitation    mild to moderate MR with MVP of ANMVL by echo 2019   PVC's (premature ventricular contractions)    nonsustained VT as well as PVCs - no ICD indicated  due to EF 40-45%   Sleep apnea    pending second sleep study    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     Family History  Problem Relation Age of Onset   Heart Problems Mother    Stroke Mother    Heart failure Mother    Hypertension Mother    Heart Problems Father    Heart attack Father    Hypertension Father    Heart Problems Brother    Heart attack Brother    Hypertension Brother    Colon cancer Neg Hx    Esophageal cancer Neg Hx    Stomach cancer Neg Hx    Colon polyps Neg Hx    Rectal cancer Neg Hx      Past Surgical History:  Procedure Laterality Date   ABDOMINAL SURGERY  at birth   BUBBLE STUDY  03/16/2019   Procedure: BUBBLE STUDY;  Surgeon: Larey Dresser, MD;  Location: [place];  Service: Cardiovascular;;   CARDIOVERSION N/A 06/08/2020   Procedure: CARDIOVERSION;  Surgeon: Larey Dresser, MD;  Location: [facility] ENDOSCOPY;  Service: Cardiovascular;  Laterality: N/A;   RIGHT/LEFT HEART CATH AND CORONARY ANGIOGRAPHY N/A 03/16/2019   Procedure: RIGHT/LEFT HEART CATH AND CORONARY ANGIOGRAPHY;  Surgeon: Larey Dresser, MD;  Location: [place] CV LAB;  Service: Cardiovascular;  Laterality: N/A;   RIGHT/LEFT HEART CATH AND CORONARY ANGIOGRAPHY N/A 11/09/2020   Procedure: RIGHT/LEFT HEART CATH AND CORONARY ANGIOGRAPHY;  Surgeon: Larey Dresser, MD;  Location: [place] CV LAB;  Service:  Cardiovascular;  Laterality: N/A;   TEE WITHOUT CARDIOVERSION N/A 01/25/2014   Procedure: TRANSESOPHAGEAL ECHOCARDIOGRAM (TEE);  Surgeon: [name], MD;  Location: [place];  Service: Cardiovascular;  Laterality: N/A;   TEE WITHOUT CARDIOVERSION N/A 09/14/2014   Procedure: TRANSESOPHAGEAL ECHOCARDIOGRAM (TEE);  Surgeon: Sueanne Margarita, MD;  Location: [facility] ENDOSCOPY;  Service: Cardiovascular;  Laterality: N/A;   TEE WITHOUT CARDIOVERSION N/A 03/16/2019   Procedure: TRANSESOPHAGEAL ECHOCARDIOGRAM (TEE);  Surgeon: Larey Dresser, MD;  Location[facility] ENDOSCOPY;   Service: Cardiovascular;  Laterality: N/A;   TEE WITHOUT CARDIOVERSION N/A 06/08/2020   Procedure: TRANSESOPHAGEAL ECHOCARDIOGRAM (TEE);  Surgeon: Larey Dresser, MD;  Location: [facility] ENDOSCOPY;  Service: Cardiovascular;  Laterality: N/A;    Social History   Socioeconomic History   Marital status: Divorced    Spouse name: Not on file   Number of children: Not on file   Years of education: Not on file   Highest education level: Not on file  Occupational History   Not on file  Tobacco Use   Smoking status: Never Smoker   Smokeless tobacco: Never Used  Vaping Use   Vaping Use: Never used  Substance and Sexual Activity   Alcohol use: No   Drug use: No   Sexual activity: Yes  Other Topics Concern   Not on file  Social History Narrative   Not on file   Social Determinants of Health   Financial Resource Strain: Not on file  Food Insecurity: Not on file  Transportation Needs: Not on file  Physical Activity: Not on file  Stress: Not on file  Social Connections: Not on file  Intimate Partner Violence: Not on file     Allergies  Allergen Reactions   Entresto [Sacubitril-Valsartan] Cough   Benazepril Cough     Outpatient Medications Prior to Visit  Medication Sig Dispense Refill   acetaminophen (TYLENOL) 650 MG CR tablet Take 1,300 mg by mouth every 8 (eight) hours as needed for pain.     allopurinol (ZYLOPRIM) 100 MG tablet Take 1 tablet (100 mg total) by mouth 2 (two) times daily. 60 tablet 6   amLODipine (NORVASC) 10 MG tablet TAKE 1 TABLET (10 MG TOTAL) BY MOUTH DAILY WITH LUNCH (NOON) 30 tablet 8   apixaban (ELIQUIS) 5 MG TABS tablet Take 1 tablet (5 mg total) by mouth 2 (two) times daily. 60 tablet 0   bisacodyl (DULCOLAX) 5 MG EC tablet Take 1 tablet (5 mg total) by mouth daily as needed for moderate constipation. 4 tablet 0   carvedilol (COREG) 25 MG tablet Take 1.5 tablets (37.5 mg total) by mouth 2 (two) times daily. 270 tablet 3   cetirizine (ZYRTEC) 10 MG tablet  Take 10 mg by mouth daily as needed for allergies.     dicyclomine (BENTYL) 20 MG tablet Take 1 tablet (20 mg total) by mouth 2 (two) times daily. 20 tablet 0   docusate sodium (COLACE) 100 MG capsule Take 100 mg by mouth daily as needed for mild constipation.     Ensure Max Protein (ENSURE MAX PROTEIN) LIQD Take 330 mLs (11 oz total) by mouth daily. 330 mL 3   furosemide (LASIX) 20 MG tablet Take 2 tablets (40 mg total) by mouth 2 (two) times daily. 360 tablet 3   gabapentin (NEURONTIN) 300 MG capsule Take 300 mg by mouth daily as needed (nerve pain).     hydrALAZINE (APRESOLINE) 100 MG tablet Take 1 tablet (100 mg total) by mouth 3 (three) times daily. [place]  tablet 3   isosorbide mononitrate (IMDUR) 60 MG 24 hr tablet Take 1.5 tablets (90 mg total) by mouth daily. 135 tablet 3   losartan (COZAAR) 100 MG tablet Take 1 tablet (100 mg total) by mouth in the morning AND 0.5 tablets (50 mg total) every evening. 135 tablet 3   Multiple Vitamin (MULTIVITAMIN WITH MINERALS) TABS tablet Take 1 tablet by mouth daily. 30 tablet 0   ondansetron (ZOFRAN ODT) 8 MG disintegrating tablet Take 1 tablet (8 mg total) by mouth every 8 (eight) hours as needed for nausea or vomiting. 20 tablet 0   polyethylene glycol powder (GLYCOLAX/MIRALAX) 17 GM/SCOOP powder Take 17 g by mouth daily as needed for mild constipation, moderate constipation or severe constipation.     potassium chloride (KLOR-CON) 10 MEQ tablet Take 10 mEq by mouth daily.     rosuvastatin (CRESTOR) 10 MG tablet Take 1 tablet (10 mg total) by mouth daily. 90 tablet 3   spironolactone (ALDACTONE) 50 MG tablet Take 1 tablet (50 mg total) by mouth daily. 90 tablet 3   tizanidine (ZANAFLEX) 2 MG capsule Take 2 mg by mouth 3 (three) times daily as needed for muscle spasms.     No facility-administered medications prior to visit.    ROS   Objective:  Physical Exam   There were no vitals filed for this visit.   on  LPM  RA BMI Readings from Last 3  Encounters:  02/09/21 30.82 kg/m  01/11/21 30.42 kg/m  12/05/20 31.11 kg/m   Wt Readings from Last 3 Encounters:  02/09/21 233 lb 9.6 oz (106 kg)  01/11/21 230 lb 9.6 oz (104.6 kg)  12/05/20 235 lb 12.8 oz (107 kg)     CBC    Component Value Date/Time   WBC 2.3 (L) 11/09/2020 0834   RBC 4.03 (L) 11/09/2020 0834   HGB 13.3 11/09/2020 1024   HCT 39.0 11/09/2020 1024   PLT 130 (L) 11/09/2020 0834   MCV 93.5 11/09/2020 0834   MCH 32.3 11/09/2020 0834   MCHC 34.5 11/09/2020 0834   RDW 12.9 11/09/2020 0834   LYMPHSABS 0.9 09/01/2019 0541   MONOABS 0.7 09/01/2019 0541   EOSABS 0.3 09/01/2019 0541   BASOSABS 0.0 09/01/2019 0541      Chest Imaging:   Pulmonary Functions Testing Results: No flowsheet data found.  FeNO:   Pathology:   Echocardiogram:   Heart Catheterization:     Assessment & Plan:   No diagnosis found.  Discussion:  Patient left without being seen after showing up 51 mins late for his appointment time.     Current Outpatient Medications:    acetaminophen (TYLENOL) 650 MG CR tablet, Take 1,300 mg by mouth every 8 (eight) hours as needed for pain., Disp: , Rfl:    allopurinol (ZYLOPRIM) 100 MG tablet, Take 1 tablet (100 mg total) by mouth 2 (two) times daily., Disp: 60 tablet, Rfl: 6   amLODipine (NORVASC) 10 MG tablet, TAKE 1 TABLET (10 MG TOTAL) BY MOUTH DAILY WITH LUNCH (NOON), Disp: 30 tablet, Rfl: 8   apixaban (ELIQUIS) 5 MG TABS tablet, Take 1 tablet (5 mg total) by mouth 2 (two) times daily., Disp: 60 tablet, Rfl: 0   bisacodyl (DULCOLAX) 5 MG EC tablet, Take 1 tablet (5 mg total) by mouth daily as needed for moderate constipation., Disp: 4 tablet, Rfl: 0   carvedilol (COREG) 25 MG tablet, Take 1.5 tablets (37.5 mg total) by mouth 2 (two) times daily., Disp: 270 tablet, Rfl: 3  cetirizine (ZYRTEC) 10 MG tablet, Take 10 mg by mouth daily as needed for allergies., Disp: , Rfl:    dicyclomine (BENTYL) 20 MG tablet, Take 1 tablet (20 mg  total) by mouth 2 (two) times daily., Disp: 20 tablet, Rfl: 0   docusate sodium (COLACE) 100 MG capsule, Take 100 mg by mouth daily as needed for mild constipation., Disp: , Rfl:    Ensure Max Protein (ENSURE MAX PROTEIN) LIQD, Take 330 mLs (11 oz total) by mouth daily., Disp: 330 mL, Rfl: 3   furosemide (LASIX) 20 MG tablet, Take 2 tablets (40 mg total) by mouth 2 (two) times daily., Disp: 360 tablet, Rfl: 3   gabapentin (NEURONTIN) 300 MG capsule, Take 300 mg by mouth daily as needed (nerve pain)., Disp: , Rfl:    hydrALAZINE (APRESOLINE) 100 MG tablet, Take 1 tablet (100 mg total) by mouth 3 (three) times daily., Disp: 270 tablet, Rfl: 3   isosorbide mononitrate (IMDUR) 60 MG 24 hr tablet, Take 1.5 tablets (90 mg total) by mouth daily., Disp: 135 tablet, Rfl: 3   losartan (COZAAR) 100 MG tablet, Take 1 tablet (100 mg total) by mouth in the morning AND 0.5 tablets (50 mg total) every evening., Disp: 135 tablet, Rfl: 3   Multiple Vitamin (MULTIVITAMIN WITH MINERALS) TABS tablet, Take 1 tablet by mouth daily., Disp: 30 tablet, Rfl: 0   ondansetron (ZOFRAN ODT) 8 MG disintegrating tablet, Take 1 tablet (8 mg total) by mouth every 8 (eight) hours as needed for nausea or vomiting., Disp: 20 tablet, Rfl: 0   polyethylene glycol powder (GLYCOLAX/MIRALAX) 17 GM/SCOOP powder, Take 17 g by mouth daily as needed for mild constipation, moderate constipation or severe constipation., Disp: , Rfl:    potassium chloride (KLOR-CON) 10 MEQ tablet, Take 10 mEq by mouth daily., Disp: , Rfl:    rosuvastatin (CRESTOR) 10 MG tablet, Take 1 tablet (10 mg total) by mouth daily., Disp: 90 tablet, Rfl: 3   spironolactone (ALDACTONE) 50 MG tablet, Take 1 tablet (50 mg total) by mouth daily., Disp: 90 tablet, Rfl: 3   tizanidine (ZANAFLEX) 2 MG capsule, Take 2 mg by mouth 3 (three) times daily as needed for muscle spasms., Disp: , Rfl:    Garner Nash, DO [facility] 02/21/2021 1:27 PM

## 2021-02-22 ENCOUNTER — Ambulatory Visit (INDEPENDENT_AMBULATORY_CARE_PROVIDER_SITE_OTHER): Payer: Medicaid Other | Admitting: Cardiothoracic Surgery

## 2021-02-22 ENCOUNTER — Other Ambulatory Visit: Payer: Self-pay

## 2021-02-22 VITALS — BP 156/100 | HR 79 | Resp 20 | Ht 73.0 in | Wt 230.0 lb

## 2021-02-22 DIAGNOSIS — I340 Nonrheumatic mitral (valve) insufficiency: Secondary | ICD-10-CM | POA: Diagnosis not present

## 2021-02-22 NOTE — Progress Notes (Signed)
Chief complaint: Shortness of breath  History of present illness: 60 year old man seen once again in the outpatient setting for severe mitral regurgitation and heart failure.  Since his last visit he has been seen by Dr. [name] in the advanced heart failure clinic.  He has known severe mitral regurgitation and mildly depressed LV function.  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  He is being referred to [facility] sarcoid clinic.  His symptoms have been relatively stable.  He does describe an episode of near syncope when working outside recently  Active Ambulatory Problems    Diagnosis Date Noted   Mitral valve regurgitation 01/21/2014   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   Neck nodule 02/01/2014   Eye redness 02/01/2014   CKD (chronic kidney disease), stage III ([place]) 02/15/2014   HTN (hypertension) 03/02/2014   Chronic combined systolic and diastolic CHF (congestive heart failure) ([place]) 03/02/2014   Hypertensive cardiomyopathy ([place]) 03/02/2014   PVC's (premature ventricular contractions)    Acute exacerbation of CHF (congestive heart failure) ([place]) 08/29/2019   Alcohol abuse 08/29/2019   Hypoxic Respiratory failure, acute ([place]) 08/29/2019   Neuropathy 02/02/2020   Nausea 02/02/2020   Resolved Ambulatory Problems    Diagnosis Date Noted   Hypertensive urgency 01/19/2014   Accelerated hypertension 01/19/2014   PND (paroxysmal nocturnal dyspnea) 01/19/2014   Nocturia 01/19/2014   Blurred vision 01/19/2014   Headache 01/19/2014   T wave inversion in EKG 01/19/2014   Acute kidney injury ([place]) 01/20/2014   Acute on chronic combined systolic and diastolic CHF, NYHA class 4 ([place]) 01/21/2014   Acute on chronic combined systolic and diastolic CHF (congestive heart failure) ([place]) 01/23/2014   Encounter to establish care 02/03/2014   Immunization due 02/15/2014   HTN (hypertension), malignant 02/15/2014   Proteinuria 02/15/2014   Abnormal EKG 03/02/2014   Encounter  for staple removal 08/23/2014   Nonsustained paroxysmal ventricular tachycardia ([place]) 01/26/2015   Heel spur 08/29/2015   Acute gout 03/06/2016   Left elbow pain 03/06/2016   Acute midline low back pain without sciatica 09/19/2016   Erroneous encounter - disregard 05/08/2017   Past Medical History:  Diagnosis Date   Benign essential HTN 01/19/2014   Cardiomyopathy, dilated, nonischemic (HCC)    Echocardiogram 5.2020    Gout    Sleep apnea     Current Outpatient Medications on File Prior to Visit  Medication Sig Dispense Refill   acetaminophen (TYLENOL) 650 MG CR tablet Take 1,300 mg by mouth every 8 (eight) hours as needed for pain.     allopurinol (ZYLOPRIM) 100 MG tablet Take 1 tablet (100 mg total) by mouth 2 (two) times daily. 60 tablet 6   amLODipine (NORVASC) 10 MG tablet TAKE 1 TABLET (10 MG TOTAL) BY MOUTH DAILY WITH LUNCH (NOON) 30 tablet 8   apixaban (ELIQUIS) 5 MG TABS tablet Take 1 tablet (5 mg total) by mouth 2 (two) times daily. 60 tablet 0   bisacodyl (DULCOLAX) 5 MG EC tablet Take 1 tablet (5 mg total) by mouth daily as needed for moderate constipation. 4 tablet 0   carvedilol (COREG) 25 MG tablet Take 1.5 tablets (37.5 mg total) by mouth 2 (two) times daily. 270 tablet 3   cetirizine (ZYRTEC) 10 MG tablet Take 10 mg by mouth daily as needed for allergies.     dicyclomine (BENTYL) 20 MG tablet Take 1 tablet (20 mg total) by mouth 2 (two) times daily. 20 tablet 0   docusate sodium (COLACE) 100 MG capsule Take  100 mg by mouth daily as needed for mild constipation.     Ensure Max Protein (ENSURE MAX PROTEIN) LIQD Take 330 mLs (11 oz total) by mouth daily. 330 mL 3   furosemide (LASIX) 20 MG tablet Take 2 tablets (40 mg total) by mouth 2 (two) times daily. 360 tablet 3   gabapentin (NEURONTIN) 300 MG capsule Take 300 mg by mouth daily as needed (nerve pain).     hydrALAZINE (APRESOLINE) 100 MG tablet Take 1 tablet (100 mg total) by mouth 3 (three) times daily. 270 tablet 3    isosorbide mononitrate (IMDUR) 60 MG 24 hr tablet Take 1.5 tablets (90 mg total) by mouth daily. 135 tablet 3   losartan (COZAAR) 100 MG tablet Take 1 tablet (100 mg total) by mouth in the morning AND 0.5 tablets (50 mg total) every evening. 135 tablet 3   Multiple Vitamin (MULTIVITAMIN WITH MINERALS) TABS tablet Take 1 tablet by mouth daily. 30 tablet 0   ondansetron (ZOFRAN ODT) 8 MG disintegrating tablet Take 1 tablet (8 mg total) by mouth every 8 (eight) hours as needed for nausea or vomiting. 20 tablet 0   polyethylene glycol powder (GLYCOLAX/MIRALAX) 17 GM/SCOOP powder Take 17 g by mouth daily as needed for mild constipation, moderate constipation or severe constipation.     potassium chloride (KLOR-CON) 10 MEQ tablet Take 10 mEq by mouth daily.     rosuvastatin (CRESTOR) 10 MG tablet Take 1 tablet (10 mg total) by mouth daily. 90 tablet 3   spironolactone (ALDACTONE) 50 MG tablet Take 1 tablet (50 mg total) by mouth daily. 90 tablet 3   tizanidine (ZANAFLEX) 2 MG capsule Take 2 mg by mouth 3 (three) times daily as needed for muscle spasms.     No current facility-administered medications on file prior to visit.  Physical exam: BP (!) 156/100   Pulse 79   Resp 20   Ht 6\' 1"  (1.854 m)   Wt 104.3 kg   SpO2 98% Comment: RA  BMI 30.34 kg/m  Well-appearing no acute distress HEENT: Normocephalic atraumatic no JVD Chest: Clear to auscultation bilaterally Cardiac: Irregularly irregular; 3/6 systolic ejection murmur radiating to the left back Extremities: No clubbing cyanosis or edema  Imaging: No new imaging studies since last visit  Assessment/plan: 60 year old man with heart failure and severe mitral regurgitation.  Agree with plan to slowly evaluate sarcoid and amyloid prior to entertaining any surgical procedure.  I wonder if he would be a transplant candidate.  [name] Z. Orvan Seen, [place]

## 2021-02-28 DIAGNOSIS — I472 Ventricular tachycardia: Secondary | ICD-10-CM | POA: Diagnosis not present

## 2021-02-28 DIAGNOSIS — I340 Nonrheumatic mitral (valve) insufficiency: Secondary | ICD-10-CM | POA: Diagnosis not present

## 2021-02-28 DIAGNOSIS — D8685 Sarcoid myocarditis: Secondary | ICD-10-CM | POA: Diagnosis not present

## 2021-03-02 ENCOUNTER — Telehealth (HOSPITAL_COMMUNITY): Payer: Self-pay | Admitting: *Deleted

## 2021-03-02 NOTE — Telephone Encounter (Signed)
Pt left vm stating he needs a referral to "[facility]". I called pt back to get more information no answer/left vm for return call.

## 2021-03-05 ENCOUNTER — Other Ambulatory Visit (HOSPITAL_COMMUNITY): Payer: Self-pay | Admitting: Cardiology

## 2021-03-05 DIAGNOSIS — I4892 Unspecified atrial flutter: Secondary | ICD-10-CM

## 2021-03-07 ENCOUNTER — Other Ambulatory Visit (HOSPITAL_COMMUNITY): Payer: Self-pay | Admitting: Cardiology

## 2021-03-14 ENCOUNTER — Telehealth: Payer: Self-pay | Admitting: Pulmonary Disease

## 2021-03-14 ENCOUNTER — Ambulatory Visit (INDEPENDENT_AMBULATORY_CARE_PROVIDER_SITE_OTHER): Payer: Medicaid Other | Admitting: Pulmonary Disease

## 2021-03-14 ENCOUNTER — Encounter: Payer: Self-pay | Admitting: Pulmonary Disease

## 2021-03-14 ENCOUNTER — Other Ambulatory Visit: Payer: Self-pay

## 2021-03-14 VITALS — BP 144/82 | HR 72 | Temp 98.1°F | Ht 73.0 in | Wt 230.4 lb

## 2021-03-14 DIAGNOSIS — I5042 Chronic combined systolic (congestive) and diastolic (congestive) heart failure: Secondary | ICD-10-CM

## 2021-03-14 DIAGNOSIS — D8685 Sarcoid myocarditis: Secondary | ICD-10-CM

## 2021-03-14 DIAGNOSIS — R599 Enlarged lymph nodes, unspecified: Secondary | ICD-10-CM | POA: Diagnosis not present

## 2021-03-14 DIAGNOSIS — R590 Localized enlarged lymph nodes: Secondary | ICD-10-CM

## 2021-03-14 NOTE — Telephone Encounter (Signed)
Pt informed of the following:  Covid test sched for 7/11 at 12:00pm  EBUS 7/14 at 7:30am; 5:30 arrival time.  Stop Eliquis- 7/11 evening last dose

## 2021-03-14 NOTE — H&P (View-Only) (Signed)
Synopsis: Referred in June 2022 for abnormal ct imaging by [name], MD  Subjective:   PATIENT ID: Daniel Joseph GENDER: male DOB: 05/10/1961, MRN: [id number]  Chief Complaint  Patient presents with   Consult    Pulm. Nodules.Sob with exertion since 2015.    This is a 60 year old gentleman, history of cardiomyopathy, chronic systolic heart failure.  Also has sleep apnea.  Seen by Dr. [name] from the advanced heart failure service.  She had work-up for his cardiac abnormalities to include a cardiac MRI which showed mid left ventricular wall late gadolinium enhancement concerning for sarcoidosis.  Patient ultimately was sent to [facility] for a specialized cardiac PET scan.  This was also concerning for potential sarcoidosis.  He is found to have some small hilar bilateral lymph nodes that were PET avid as well as a small right-sided pretracheal node that had low-level PET uptake.  This concerning for sarcoidosis.  He has not had a tissue diagnosis.  As of this time.  Patient was referred to me for evaluation of bronchoscopy and tissue sampling of the lymph nodes present within the chest to confirm diagnosis of sarcoid.  He additionally is undergoing work-up at [facility] for consideration of valve surgery.   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, PASP 79   Gout    Mitral valve regurgitation    mild to moderate MR with MVP of ANMVL by echo 2019   PVC's (premature ventricular contractions)    nonsustained VT as well as PVCs - no  ICD indicated due to EF 40-45%   Sleep apnea    pending second sleep study    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     Family History  Problem Relation Age of Onset   Heart Problems Mother    Stroke Mother    Heart failure Mother    Hypertension Mother    Heart Problems Father    Heart attack Father    Hypertension Father    Heart Problems Brother    Heart attack Brother    Hypertension Brother    Colon cancer Neg Hx    Esophageal cancer Neg Hx    Stomach cancer Neg Hx    Colon polyps Neg Hx    Rectal cancer Neg Hx      Past Surgical History:  Procedure Laterality Date   ABDOMINAL SURGERY  at birth   BUBBLE STUDY  03/16/2019   Procedure: BUBBLE STUDY;  Surgeon: Larey Dresser, MD;  Location: [place];  Service: Cardiovascular;;   CARDIAC VALVE REPLACEMENT     CARDIOVERSION N/A 06/08/2020   Procedure: CARDIOVERSION;  Surgeon: Larey Dresser, MD;  Location: [facility] ENDOSCOPY;  Service: Cardiovascular;  Laterality: N/A;   RIGHT/LEFT HEART CATH AND CORONARY ANGIOGRAPHY N/A 03/16/2019   Procedure: RIGHT/LEFT HEART CATH AND CORONARY ANGIOGRAPHY;  Surgeon: Larey Dresser, MD;  Location: [place] CV LAB;  Service: Cardiovascular;  Laterality: N/A;   RIGHT/LEFT HEART CATH AND CORONARY ANGIOGRAPHY N/A  11/09/2020   Procedure: RIGHT/LEFT HEART CATH AND CORONARY ANGIOGRAPHY;  Surgeon: Larey Dresser, MD;  Location: [place] CV LAB;  Service: Cardiovascular;  Laterality: N/A;   TEE WITHOUT CARDIOVERSION N/A 01/25/2014   Procedure: TRANSESOPHAGEAL ECHOCARDIOGRAM (TEE);  Surgeon: [name], MD;  Location: [place];  Service: Cardiovascular;  Laterality: N/A;   TEE WITHOUT CARDIOVERSION N/A 09/14/2014   Procedure: TRANSESOPHAGEAL ECHOCARDIOGRAM (TEE);  Surgeon: Sueanne Margarita, MD;  Location: [facility] ENDOSCOPY;  Service: Cardiovascular;  Laterality: N/A;   TEE WITHOUT CARDIOVERSION N/A 03/16/2019   Procedure: TRANSESOPHAGEAL ECHOCARDIOGRAM (TEE);  Surgeon:  Larey Dresser, MD;  Location: [facility] ENDOSCOPY;  Service: Cardiovascular;  Laterality: N/A;   TEE WITHOUT CARDIOVERSION N/A 06/08/2020   Procedure: TRANSESOPHAGEAL ECHOCARDIOGRAM (TEE);  Surgeon: Larey Dresser, MD;  Location: [facility] ENDOSCOPY;  Service: Cardiovascular;  Laterality: N/A;    Social History   Socioeconomic History   Marital status: Divorced    Spouse name: Not on file   Number of children: Not on file   Years of education: Not on file   Highest education level: Not on file  Occupational History   Not on file  Tobacco Use   Smoking status: Never   Smokeless tobacco: Never  Vaping Use   Vaping Use: Never used  Substance and Sexual Activity   Alcohol use: No   Drug use: No   Sexual activity: Yes  Other Topics Concern   Not on file  Social History Narrative   Not on file   Social Determinants of Health   Financial Resource Strain: Not on file  Food Insecurity: Not on file  Transportation Needs: Not on file  Physical Activity: Not on file  Stress: Not on file  Social Connections: Not on file  Intimate Partner Violence: Not on file     Allergies  Allergen Reactions   Entresto [Sacubitril-Valsartan] Cough   Benazepril Cough     Outpatient Medications Prior to Visit  Medication Sig Dispense Refill   acetaminophen (TYLENOL) 650 MG CR tablet Take 1,300 mg by mouth every 8 (eight) hours as needed for pain.     allopurinol (ZYLOPRIM) 100 MG tablet Take 1 tablet (100 mg total) by mouth 2 (two) times daily. 60 tablet 6   amLODipine (NORVASC) 10 MG tablet TAKE 1 TABLET (10 MG TOTAL) BY MOUTH DAILY WITH LUNCH (NOON) 30 tablet 8   bisacodyl (DULCOLAX) 5 MG EC tablet Take 1 tablet (5 mg total) by mouth daily as needed for moderate constipation. 4 tablet 0   carvedilol (COREG) 25 MG tablet Take 1.5 tablets (37.5 mg total) by mouth 2 (two) times daily. 270 tablet 3   cetirizine (ZYRTEC) 10 MG tablet Take 10 mg by mouth daily as needed for allergies.     dicyclomine  (BENTYL) 20 MG tablet Take 1 tablet (20 mg total) by mouth 2 (two) times daily. 20 tablet 0   docusate sodium (COLACE) 100 MG capsule Take 100 mg by mouth daily as needed for mild constipation.     ELIQUIS 5 MG TABS tablet TAKE 1 TABLET (5 MG TOTAL) BY MOUTH 2 (TWO) TIMES DAILY. 60 tablet 3   Ensure Max Protein (ENSURE MAX PROTEIN) LIQD Take 330 mLs (11 oz total) by mouth daily. 330 mL 3   furosemide (LASIX) 20 MG tablet Take 2 tablets (40 mg total) by mouth 2 (two) times daily. 360 tablet 3   gabapentin (NEURONTIN) 300 MG capsule Take 300 mg by mouth daily as needed (nerve pain).  hydrALAZINE (APRESOLINE) 100 MG tablet Take 1 tablet (100 mg total) by mouth 3 (three) times daily. 270 tablet 3   isosorbide mononitrate (IMDUR) 60 MG 24 hr tablet Take 1.5 tablets (90 mg total) by mouth daily. 135 tablet 3   losartan (COZAAR) 100 MG tablet Take 1 tablet (100 mg total) by mouth in the morning AND 0.5 tablets (50 mg total) every evening. 135 tablet 3   Multiple Vitamin (MULTIVITAMIN WITH MINERALS) TABS tablet Take 1 tablet by mouth daily. 30 tablet 0   ondansetron (ZOFRAN ODT) 8 MG disintegrating tablet Take 1 tablet (8 mg total) by mouth every 8 (eight) hours as needed for nausea or vomiting. 20 tablet 0   polyethylene glycol powder (GLYCOLAX/MIRALAX) 17 GM/SCOOP powder Take 17 g by mouth daily as needed for mild constipation, moderate constipation or severe constipation.     potassium chloride (KLOR-CON) 10 MEQ tablet Take 10 mEq by mouth daily.     rosuvastatin (CRESTOR) 10 MG tablet TAKE 1 TABLET (10 MG TOTAL) BY MOUTH DAILY (BEDTIME) 90 tablet 3   spironolactone (ALDACTONE) 50 MG tablet Take 1 tablet (50 mg total) by mouth daily. 90 tablet 3   tizanidine (ZANAFLEX) 2 MG capsule Take 2 mg by mouth 3 (three) times daily as needed for muscle spasms.     No facility-administered medications prior to visit.    Review of Systems  Constitutional:  Negative for chills, fever, malaise/fatigue and  weight loss.  HENT:  Negative for hearing loss, sore throat and tinnitus.   Eyes:  Negative for blurred vision and double vision.  Respiratory:  Positive for shortness of breath. Negative for cough, hemoptysis, sputum production, wheezing and stridor.   Cardiovascular:  Negative for chest pain, palpitations, orthopnea, leg swelling and PND.  Gastrointestinal:  Negative for abdominal pain, constipation, diarrhea, heartburn, nausea and vomiting.  Genitourinary:  Negative for dysuria, hematuria and urgency.  Musculoskeletal:  Negative for joint pain and myalgias.  Skin:  Negative for itching and rash.  Neurological:  Negative for dizziness, tingling, weakness and headaches.  Endo/Heme/Allergies:  Negative for environmental allergies. Does not bruise/bleed easily.  Psychiatric/Behavioral:  Negative for depression. The patient is not nervous/anxious and does not have insomnia.   All other systems reviewed and are negative.   Objective:  Physical Exam Vitals reviewed.  Constitutional:      General: He is not in acute distress.    Appearance: He is well-developed.  HENT:     Head: Normocephalic and atraumatic.  Eyes:     General: No scleral icterus.    Conjunctiva/sclera: Conjunctivae normal.     Pupils: Pupils are equal, round, and reactive to light.  Neck:     Vascular: No JVD.     Trachea: No tracheal deviation.  Cardiovascular:     Rate and Rhythm: Normal rate and regular rhythm.     Heart sounds: Normal heart sounds. No murmur heard. Pulmonary:     Effort: Pulmonary effort is normal. No tachypnea, accessory muscle usage or respiratory distress.     Breath sounds: No stridor. No wheezing, rhonchi or rales.  Abdominal:     General: Bowel sounds are normal. There is no distension.     Palpations: Abdomen is soft.     Tenderness: There is no abdominal tenderness.  Musculoskeletal:        General: No tenderness.     Cervical back: Neck supple.     Right lower leg: Edema present.      Left lower leg:  Edema present.  Lymphadenopathy:     Cervical: No cervical adenopathy.  Skin:    General: Skin is warm and dry.     Capillary Refill: Capillary refill takes less than 2 seconds.     Findings: No rash.  Neurological:     Mental Status: He is alert and oriented to person, place, and time.  Psychiatric:        Behavior: Behavior normal.     Vitals:   03/14/21 1139  BP: (!) 144/82  Pulse: 72  Temp: 98.1 F (36.7 C)  TempSrc: Temporal  SpO2: 98%  Weight: 230 lb 6.4 oz (104.5 kg)  Height: 6\' 1"  (1.854 m)   98% on RA BMI Readings from Last 3 Encounters:  03/14/21 30.40 kg/m  02/22/21 30.34 kg/m  02/09/21 30.82 kg/m   Wt Readings from Last 3 Encounters:  03/14/21 230 lb 6.4 oz (104.5 kg)  02/22/21 230 lb (104.3 kg)  02/09/21 233 lb 9.6 oz (106 kg)     CBC    Component Value Date/Time   WBC 2.3 (L) 11/09/2020 0834   RBC 4.03 (L) 11/09/2020 0834   HGB 13.3 11/09/2020 1024   HCT 39.0 11/09/2020 1024   PLT 130 (L) 11/09/2020 0834   MCV 93.5 11/09/2020 0834   MCH 32.3 11/09/2020 0834   MCHC 34.5 11/09/2020 0834   RDW 12.9 11/09/2020 0834   LYMPHSABS 0.9 09/01/2019 0541   MONOABS 0.7 09/01/2019 0541   EOSABS 0.3 09/01/2019 0541   BASOSABS 0.0 09/01/2019 0541     Chest Imaging: CT scan of the chest: 11/21/2020: Small paratracheal lymph node enlarged, small bilateral hilar adenopathy No significant parenchymal changes.  Concerning for sarcoidosis The patient's images have been independently reviewed by me.     Pulmonary Functions Testing Results: No flowsheet data found.  FeNO:   Pathology:   Echocardiogram:   Heart Catheterization:     Assessment & Plan:     ICD-10-CM   1. Adenopathy  R59.9 Ambulatory referral to Pulmonology    Procedural/ Surgical Case Request: VIDEO BRONCHOSCOPY WITH ENDOBRONCHIAL ULTRASOUND    2. 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 Chronic combined systolic and diastolic  CHF (congestive heart failure) ([place])  I50.42       Discussion:  This is a 60 year old gentleman with cardiac MRI and cardiac PET imaging concerning for sarcoidosis.  He has no previous tissue diagnosis for confirmation of sarcoid.  He does have a CT scan of the chest which reveals a small right paratracheal node as well as small bilateral hilar nodes.  The hilar nodes were PET avid on his cardiac PET that was completed at [facility].  Reports and images reviewed today in the office with patient.  Referred today for evaluation of videobronchoscope and tissue sampling.  Plan: I discussed today with the patient the risk benefits and alternatives of proceeding with tissue diagnosis.  And sampling of the mediastinum. He understands that the lesion/lymph nodes that he has within the chest are small.  I think diagnostic yield on such a small node is also low even for patient with active sarcoidosis. I think this however proceeding with video bronchoscopy with endobronchial ultrasound is likely the least invasive option to help establish a tissue diagnosis before starting someone on immune suppression for cardiac sarcoid. I believe this is the most reasonable next best step for evaluation. Patient understands the rationale.  We will plan for video bronchoscopy tentative 03/29/2021.  Orders placed today as well as case request.  All questions were answered.  We appreciate consultation.   Current Outpatient Medications:    acetaminophen (TYLENOL) 650 MG CR tablet, Take 1,300 mg by mouth every 8 (eight) hours as needed for pain., Disp: , Rfl:    allopurinol (ZYLOPRIM) 100 MG tablet, Take 1 tablet (100 mg total) by mouth 2 (two) times daily., Disp: 60 tablet, Rfl: 6   amLODipine (NORVASC) 10 MG tablet, TAKE 1 TABLET (10 MG TOTAL) BY MOUTH DAILY WITH LUNCH (NOON), Disp: 30 tablet, Rfl: 8   bisacodyl (DULCOLAX) 5 MG EC tablet, Take 1 tablet (5 mg total) by mouth daily as needed for moderate constipation.,  Disp: 4 tablet, Rfl: 0   carvedilol (COREG) 25 MG tablet, Take 1.5 tablets (37.5 mg total) by mouth 2 (two) times daily., Disp: 270 tablet, Rfl: 3   cetirizine (ZYRTEC) 10 MG tablet, Take 10 mg by mouth daily as needed for allergies., Disp: , Rfl:    dicyclomine (BENTYL) 20 MG tablet, Take 1 tablet (20 mg total) by mouth 2 (two) times daily., Disp: 20 tablet, Rfl: 0   docusate sodium (COLACE) 100 MG capsule, Take 100 mg by mouth daily as needed for mild constipation., Disp: , Rfl:    ELIQUIS 5 MG TABS tablet, TAKE 1 TABLET (5 MG TOTAL) BY MOUTH 2 (TWO) TIMES DAILY., Disp: 60 tablet, Rfl: 3   Ensure Max Protein (ENSURE MAX PROTEIN) LIQD, Take 330 mLs (11 oz total) by mouth daily., Disp: 330 mL, Rfl: 3   furosemide (LASIX) 20 MG tablet, Take 2 tablets (40 mg total) by mouth 2 (two) times daily., Disp: 360 tablet, Rfl: 3   gabapentin (NEURONTIN) 300 MG capsule, Take 300 mg by mouth daily as needed (nerve pain)., Disp: , Rfl:    hydrALAZINE (APRESOLINE) 100 MG tablet, Take 1 tablet (100 mg total) by mouth 3 (three) times daily., Disp: 270 tablet, Rfl: 3   isosorbide mononitrate (IMDUR) 60 MG 24 hr tablet, Take 1.5 tablets (90 mg total) by mouth daily., Disp: 135 tablet, Rfl: 3   losartan (COZAAR) 100 MG tablet, Take 1 tablet (100 mg total) by mouth in the morning AND 0.5 tablets (50 mg total) every evening., Disp: 135 tablet, Rfl: 3   Multiple Vitamin (MULTIVITAMIN WITH MINERALS) TABS tablet, Take 1 tablet by mouth daily., Disp: 30 tablet, Rfl: 0   ondansetron (ZOFRAN ODT) 8 MG disintegrating tablet, Take 1 tablet (8 mg total) by mouth every 8 (eight) hours as needed for nausea or vomiting., Disp: 20 tablet, Rfl: 0   polyethylene glycol powder (GLYCOLAX/MIRALAX) 17 GM/SCOOP powder, Take 17 g by mouth daily as needed for mild constipation, moderate constipation or severe constipation., Disp: , Rfl:    potassium chloride (KLOR-CON) 10 MEQ tablet, Take 10 mEq by mouth daily., Disp: , Rfl:    rosuvastatin  (CRESTOR) 10 MG tablet, TAKE 1 TABLET (10 MG TOTAL) BY MOUTH DAILY (BEDTIME), Disp: 90 tablet, Rfl: 3   spironolactone (ALDACTONE) 50 MG tablet, Take 1 tablet (50 mg total) by mouth daily., Disp: 90 tablet, Rfl: 3   tizanidine (ZANAFLEX) 2 MG capsule, Take 2 mg by mouth 3 (three) times daily as needed for muscle spasms., Disp: , Rfl:   I spent 62 minutes dedicated to the care of this patient on the date of this encounter to include pre-visit review of records, face-to-face time with the patient discussing conditions above, post visit ordering of testing, clinical documentation with the electronic health record, making  appropriate referrals as documented, and communicating necessary findings to members of the patients care team.   Garner Nash, [facility] 03/14/2021 6:12 PM

## 2021-03-14 NOTE — Patient Instructions (Signed)
Thank you for visiting Dr. Valeta Harms at [facility]. Today we recommend the following:  Orders Placed This Encounter  Procedures   Procedural/ Surgical Case Request: [place]   Ambulatory referral to Pulmonology    Tentative date 03/29/2021   Return in about 6 weeks (around 04/25/2021) for with APP or Dr. Valeta Harms.    Please do your part to reduce the spread of COVID-19.

## 2021-03-14 NOTE — Progress Notes (Signed)
Synopsis: Referred in June 2022 for abnormal ct imaging by [name], MD  Subjective:   PATIENT ID: Daniel Joseph GENDER: male DOB: [birth year], MRN: [id number]  Chief Complaint  Patient presents with   Consult    Pulm. Nodules.Sob with exertion since 2015.    This is a 60 year old gentleman, history of cardiomyopathy, chronic systolic heart failure.  Also has sleep apnea.  Seen by Dr. [name] from the advanced heart failure service.  She had work-up for his cardiac abnormalities to include a cardiac MRI which showed mid left ventricular wall late gadolinium enhancement concerning for sarcoidosis.  Patient ultimately was sent to [facility] for a specialized cardiac PET scan.  This was also concerning for potential sarcoidosis.  He is found to have some small hilar bilateral lymph nodes that were PET avid as well as a small right-sided pretracheal node that had low-level PET uptake.  This concerning for sarcoidosis.  He has not had a tissue diagnosis.  As of this time.  Patient was referred to me for evaluation of bronchoscopy and tissue sampling of the lymph nodes present within the chest to confirm diagnosis of sarcoid.  He additionally is undergoing work-up at [facility] for consideration of valve surgery.   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, PASP 79   Gout    Mitral valve regurgitation    mild to moderate MR with MVP of ANMVL by echo 2019   PVC's (premature ventricular contractions)    nonsustained VT as well as PVCs - no  ICD indicated due to EF 40-45%   Sleep apnea    pending second sleep study    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     Family History  Problem Relation Age of Onset   Heart Problems Mother    Stroke Mother    Heart failure Mother    Hypertension Mother    Heart Problems Father    Heart attack Father    Hypertension Father    Heart Problems Brother    Heart attack Brother    Hypertension Brother    Colon cancer Neg Hx    Esophageal cancer Neg Hx    Stomach cancer Neg Hx    Colon polyps Neg Hx    Rectal cancer Neg Hx      Past Surgical History:  Procedure Laterality Date   ABDOMINAL SURGERY  at birth   BUBBLE STUDY  03/16/2019   Procedure: BUBBLE STUDY;  Surgeon: Larey Dresser, MD;  Location: [place];  Service: Cardiovascular;;   CARDIAC VALVE REPLACEMENT     CARDIOVERSION N/A 06/08/2020   Procedure: CARDIOVERSION;  Surgeon: Larey Dresser, MD;  Location: [facility] ENDOSCOPY;  Service: Cardiovascular;  Laterality: N/A;   RIGHT/LEFT HEART CATH AND CORONARY ANGIOGRAPHY N/A 03/16/2019   Procedure: RIGHT/LEFT HEART CATH AND CORONARY ANGIOGRAPHY;  Surgeon: Larey Dresser, MD;  Location: [place] [facility];  Service: Cardiovascular;  Laterality: N/A;   RIGHT/LEFT HEART CATH AND CORONARY ANGIOGRAPHY N/A  11/09/2020   Procedure: RIGHT/LEFT HEART CATH AND CORONARY ANGIOGRAPHY;  Surgeon: Larey Dresser, MD;  Location: [place] [facility];  Service: Cardiovascular;  Laterality: N/A;   TEE WITHOUT CARDIOVERSION N/A 01/25/2014   Procedure: TRANSESOPHAGEAL ECHOCARDIOGRAM (TEE);  Surgeon: [name], MD;  Location: [place];  Service: Cardiovascular;  Laterality: N/A;   TEE WITHOUT CARDIOVERSION N/A 09/14/2014   Procedure: TRANSESOPHAGEAL ECHOCARDIOGRAM (TEE);  Surgeon: Sueanne Margarita, MD;  Location: [facility] ENDOSCOPY;  Service: Cardiovascular;  Laterality: N/A;   TEE WITHOUT CARDIOVERSION N/A 03/16/2019   Procedure: TRANSESOPHAGEAL ECHOCARDIOGRAM (TEE);  Surgeon:  Larey Dresser, MD;  Location: [facility] ENDOSCOPY;  Service: Cardiovascular;  Laterality: N/A;   TEE WITHOUT CARDIOVERSION N/A 06/08/2020   Procedure: TRANSESOPHAGEAL ECHOCARDIOGRAM (TEE);  Surgeon: Larey Dresser, MD;  Location: [facility] ENDOSCOPY;  Service: Cardiovascular;  Laterality: N/A;    Social History   Socioeconomic History   Marital status: Divorced    Spouse name: Not on file   Number of children: Not on file   Years of education: Not on file   Highest education level: Not on file  Occupational History   Not on file  Tobacco Use   Smoking status: Never   Smokeless tobacco: Never  Vaping Use   Vaping Use: Never used  Substance and Sexual Activity   Alcohol use: No   Drug use: No   Sexual activity: Yes  Other Topics Concern   Not on file  Social History Narrative   Not on file   Social Determinants of Health   Financial Resource Strain: Not on file  Food Insecurity: Not on file  Transportation Needs: Not on file  Physical Activity: Not on file  Stress: Not on file  Social Connections: Not on file  Intimate Partner Violence: Not on file     Allergies  Allergen Reactions   Entresto [Sacubitril-Valsartan] Cough   Benazepril Cough     Outpatient Medications Prior to Visit  Medication Sig Dispense Refill   acetaminophen (TYLENOL) 650 MG CR tablet Take 1,300 mg by mouth every 8 (eight) hours as needed for pain.     allopurinol (ZYLOPRIM) 100 MG tablet Take 1 tablet (100 mg total) by mouth 2 (two) times daily. 60 tablet 6   amLODipine (NORVASC) 10 MG tablet TAKE 1 TABLET (10 MG TOTAL) BY MOUTH DAILY WITH LUNCH (NOON) 30 tablet 8   bisacodyl (DULCOLAX) 5 MG EC tablet Take 1 tablet (5 mg total) by mouth daily as needed for moderate constipation. 4 tablet 0   carvedilol (COREG) 25 MG tablet Take 1.5 tablets (37.5 mg total) by mouth 2 (two) times daily. 270 tablet 3   cetirizine (ZYRTEC) 10 MG tablet Take 10 mg by mouth daily as needed for allergies.     dicyclomine  (BENTYL) 20 MG tablet Take 1 tablet (20 mg total) by mouth 2 (two) times daily. 20 tablet 0   docusate sodium (COLACE) 100 MG capsule Take 100 mg by mouth daily as needed for mild constipation.     ELIQUIS 5 MG TABS tablet TAKE 1 TABLET (5 MG TOTAL) BY MOUTH 2 (TWO) TIMES DAILY. 60 tablet 3   Ensure Max Protein (ENSURE MAX PROTEIN) LIQD Take 330 mLs (11 oz total) by mouth daily. 330 mL 3   furosemide (LASIX) 20 MG tablet Take 2 tablets (40 mg total) by mouth 2 (two) times daily. 360 tablet 3   gabapentin (NEURONTIN) 300 MG capsule Take 300 mg by mouth daily as needed (nerve pain).  hydrALAZINE (APRESOLINE) 100 MG tablet Take 1 tablet (100 mg total) by mouth 3 (three) times daily. 270 tablet 3   isosorbide mononitrate (IMDUR) 60 MG 24 hr tablet Take 1.5 tablets (90 mg total) by mouth daily. 135 tablet 3   losartan (COZAAR) 100 MG tablet Take 1 tablet (100 mg total) by mouth in the morning AND 0.5 tablets (50 mg total) every evening. 135 tablet 3   Multiple Vitamin (MULTIVITAMIN WITH MINERALS) TABS tablet Take 1 tablet by mouth daily. 30 tablet 0   ondansetron (ZOFRAN ODT) 8 MG disintegrating tablet Take 1 tablet (8 mg total) by mouth every 8 (eight) hours as needed for nausea or vomiting. 20 tablet 0   polyethylene glycol powder (GLYCOLAX/MIRALAX) 17 GM/SCOOP powder Take 17 g by mouth daily as needed for mild constipation, moderate constipation or severe constipation.     potassium chloride (KLOR-CON) 10 MEQ tablet Take 10 mEq by mouth daily.     rosuvastatin (CRESTOR) 10 MG tablet TAKE 1 TABLET (10 MG TOTAL) BY MOUTH DAILY (BEDTIME) 90 tablet 3   spironolactone (ALDACTONE) 50 MG tablet Take 1 tablet (50 mg total) by mouth daily. 90 tablet 3   tizanidine (ZANAFLEX) 2 MG capsule Take 2 mg by mouth 3 (three) times daily as needed for muscle spasms.     No facility-administered medications prior to visit.    Review of Systems  Constitutional:  Negative for chills, fever, malaise/fatigue and  weight loss.  HENT:  Negative for hearing loss, sore throat and tinnitus.   Eyes:  Negative for blurred vision and double vision.  Respiratory:  Positive for shortness of breath. Negative for cough, hemoptysis, sputum production, wheezing and stridor.   Cardiovascular:  Negative for chest pain, palpitations, orthopnea, leg swelling and PND.  Gastrointestinal:  Negative for abdominal pain, constipation, diarrhea, heartburn, nausea and vomiting.  Genitourinary:  Negative for dysuria, hematuria and urgency.  Musculoskeletal:  Negative for joint pain and myalgias.  Skin:  Negative for itching and rash.  Neurological:  Negative for dizziness, tingling, weakness and headaches.  Endo/Heme/Allergies:  Negative for environmental allergies. Does not bruise/bleed easily.  Psychiatric/Behavioral:  Negative for depression. The patient is not nervous/anxious and does not have insomnia.   All other systems reviewed and are negative.   Objective:  Physical Exam Vitals reviewed.  Constitutional:      General: He is not in acute distress.    Appearance: He is well-developed.  HENT:     Head: Normocephalic and atraumatic.  Eyes:     General: No scleral icterus.    Conjunctiva/sclera: Conjunctivae normal.     Pupils: Pupils are equal, round, and reactive to light.  Neck:     Vascular: No JVD.     Trachea: No tracheal deviation.  Cardiovascular:     Rate and Rhythm: Normal rate and regular rhythm.     Heart sounds: Normal heart sounds. No murmur heard. Pulmonary:     Effort: Pulmonary effort is normal. No tachypnea, accessory muscle usage or respiratory distress.     Breath sounds: No stridor. No wheezing, rhonchi or rales.  Abdominal:     General: Bowel sounds are normal. There is no distension.     Palpations: Abdomen is soft.     Tenderness: There is no abdominal tenderness.  Musculoskeletal:        General: No tenderness.     Cervical back: Neck supple.     Right lower leg: Edema present.      Left lower leg:  Edema present.  Lymphadenopathy:     Cervical: No cervical adenopathy.  Skin:    General: Skin is warm and dry.     Capillary Refill: Capillary refill takes less than 2 seconds.     Findings: No rash.  Neurological:     Mental Status: He is alert and oriented to person, place, and time.  Psychiatric:        Behavior: Behavior normal.     Vitals:   03/14/21 1139  BP: (!) 144/82  Pulse: 72  Temp: 98.1 F (36.7 C)  TempSrc: Temporal  SpO2: 98%  Weight: 230 lb 6.4 oz (104.5 kg)  Height: 6\' 1"  (1.854 m)   98% on RA BMI Readings from Last 3 Encounters:  03/14/21 30.40 kg/m  02/22/21 30.34 kg/m  02/09/21 30.82 kg/m   Wt Readings from Last 3 Encounters:  03/14/21 230 lb 6.4 oz (104.5 kg)  02/22/21 230 lb (104.3 kg)  02/09/21 233 lb 9.6 oz (106 kg)     CBC    Component Value Date/Time   WBC 2.3 (L) 11/09/2020 0834   RBC 4.03 (L) 11/09/2020 0834   HGB 13.3 11/09/2020 1024   HCT 39.0 11/09/2020 1024   PLT 130 (L) 11/09/2020 0834   MCV 93.5 11/09/2020 0834   MCH 32.3 11/09/2020 0834   MCHC 34.5 11/09/2020 0834   RDW 12.9 11/09/2020 0834   LYMPHSABS 0.9 09/01/2019 0541   MONOABS 0.7 09/01/2019 0541   EOSABS 0.3 09/01/2019 0541   BASOSABS 0.0 09/01/2019 0541     Chest Imaging: CT scan of the chest: 11/21/2020: Small paratracheal lymph node enlarged, small bilateral hilar adenopathy No significant parenchymal changes.  Concerning for sarcoidosis The patient's images have been independently reviewed by me.     Pulmonary Functions Testing Results: No flowsheet data found.  FeNO:   Pathology:   Echocardiogram:   Heart Catheterization:     Assessment & Plan:     ICD-10-CM   1. Adenopathy  R59.9 Ambulatory referral to Pulmonology    Procedural/ Surgical Case Request: VIDEO BRONCHOSCOPY WITH ENDOBRONCHIAL ULTRASOUND    2. 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 Chronic combined systolic and diastolic  CHF (congestive heart failure) ([place])  I50.42       Discussion:  This is a 60 year old gentleman with cardiac MRI and cardiac PET imaging concerning for sarcoidosis.  He has no previous tissue diagnosis for confirmation of sarcoid.  He does have a CT scan of the chest which reveals a small right paratracheal node as well as small bilateral hilar nodes.  The hilar nodes were PET avid on his cardiac PET that was completed at [facility].  Reports and images reviewed today in the office with patient.  Referred today for evaluation of videobronchoscope and tissue sampling.  Plan: I discussed today with the patient the risk benefits and alternatives of proceeding with tissue diagnosis.  And sampling of the mediastinum. He understands that the lesion/lymph nodes that he has within the chest are small.  I think diagnostic yield on such a small node is also low even for patient with active sarcoidosis. I think this however proceeding with video bronchoscopy with endobronchial ultrasound is likely the least invasive option to help establish a tissue diagnosis before starting someone on immune suppression for cardiac sarcoid. I believe this is the most reasonable next best step for evaluation. Patient understands the rationale.  We will plan for video bronchoscopy tentative 03/29/2021.  Orders placed today as well as case request.  All questions were answered.  We appreciate consultation.   Current Outpatient Medications:    acetaminophen (TYLENOL) 650 MG CR tablet, Take 1,300 mg by mouth every 8 (eight) hours as needed for pain., Disp: , Rfl:    allopurinol (ZYLOPRIM) 100 MG tablet, Take 1 tablet (100 mg total) by mouth 2 (two) times daily., Disp: 60 tablet, Rfl: 6   amLODipine (NORVASC) 10 MG tablet, TAKE 1 TABLET (10 MG TOTAL) BY MOUTH DAILY WITH LUNCH (NOON), Disp: 30 tablet, Rfl: 8   bisacodyl (DULCOLAX) 5 MG EC tablet, Take 1 tablet (5 mg total) by mouth daily as needed for moderate constipation.,  Disp: 4 tablet, Rfl: 0   carvedilol (COREG) 25 MG tablet, Take 1.5 tablets (37.5 mg total) by mouth 2 (two) times daily., Disp: 270 tablet, Rfl: 3   cetirizine (ZYRTEC) 10 MG tablet, Take 10 mg by mouth daily as needed for allergies., Disp: , Rfl:    dicyclomine (BENTYL) 20 MG tablet, Take 1 tablet (20 mg total) by mouth 2 (two) times daily., Disp: 20 tablet, Rfl: 0   docusate sodium (COLACE) 100 MG capsule, Take 100 mg by mouth daily as needed for mild constipation., Disp: , Rfl:    ELIQUIS 5 MG TABS tablet, TAKE 1 TABLET (5 MG TOTAL) BY MOUTH 2 (TWO) TIMES DAILY., Disp: 60 tablet, Rfl: 3   Ensure Max Protein (ENSURE MAX PROTEIN) LIQD, Take 330 mLs (11 oz total) by mouth daily., Disp: 330 mL, Rfl: 3   furosemide (LASIX) 20 MG tablet, Take 2 tablets (40 mg total) by mouth 2 (two) times daily., Disp: 360 tablet, Rfl: 3   gabapentin (NEURONTIN) 300 MG capsule, Take 300 mg by mouth daily as needed (nerve pain)., Disp: , Rfl:    hydrALAZINE (APRESOLINE) 100 MG tablet, Take 1 tablet (100 mg total) by mouth 3 (three) times daily., Disp: 270 tablet, Rfl: 3   isosorbide mononitrate (IMDUR) 60 MG 24 hr tablet, Take 1.5 tablets (90 mg total) by mouth daily., Disp: 135 tablet, Rfl: 3   losartan (COZAAR) 100 MG tablet, Take 1 tablet (100 mg total) by mouth in the morning AND 0.5 tablets (50 mg total) every evening., Disp: 135 tablet, Rfl: 3   Multiple Vitamin (MULTIVITAMIN WITH MINERALS) TABS tablet, Take 1 tablet by mouth daily., Disp: 30 tablet, Rfl: 0   ondansetron (ZOFRAN ODT) 8 MG disintegrating tablet, Take 1 tablet (8 mg total) by mouth every 8 (eight) hours as needed for nausea or vomiting., Disp: 20 tablet, Rfl: 0   polyethylene glycol powder (GLYCOLAX/MIRALAX) 17 GM/SCOOP powder, Take 17 g by mouth daily as needed for mild constipation, moderate constipation or severe constipation., Disp: , Rfl:    potassium chloride (KLOR-CON) 10 MEQ tablet, Take 10 mEq by mouth daily., Disp: , Rfl:    rosuvastatin  (CRESTOR) 10 MG tablet, TAKE 1 TABLET (10 MG TOTAL) BY MOUTH DAILY (BEDTIME), Disp: 90 tablet, Rfl: 3   spironolactone (ALDACTONE) 50 MG tablet, Take 1 tablet (50 mg total) by mouth daily., Disp: 90 tablet, Rfl: 3   tizanidine (ZANAFLEX) 2 MG capsule, Take 2 mg by mouth 3 (three) times daily as needed for muscle spasms., Disp: , Rfl:   I spent 62 minutes dedicated to the care of this patient on the date of this encounter to include pre-visit review of records, face-to-face time with the patient discussing conditions above, post visit ordering of testing, clinical documentation with the electronic health record, making  appropriate referrals as documented, and communicating necessary findings to members of the patients care team.   Garner Nash, [facility] 03/14/2021 6:12 PM

## 2021-03-15 ENCOUNTER — Telehealth (HOSPITAL_COMMUNITY): Payer: Self-pay | Admitting: Cardiology

## 2021-03-20 ENCOUNTER — Encounter (HOSPITAL_COMMUNITY): Payer: Medicaid Other | Admitting: Cardiology

## 2021-03-20 DIAGNOSIS — I340 Nonrheumatic mitral (valve) insufficiency: Secondary | ICD-10-CM | POA: Diagnosis not present

## 2021-03-20 DIAGNOSIS — I071 Rheumatic tricuspid insufficiency: Secondary | ICD-10-CM | POA: Diagnosis not present

## 2021-03-22 ENCOUNTER — Ambulatory Visit (HOSPITAL_COMMUNITY)
Admission: RE | Admit: 2021-03-22 | Discharge: 2021-03-22 | Disposition: A | Discharge status: Home / Self Care | Payer: Medicaid Other | Source: Ambulatory Visit | Attending: Cardiology | Admitting: Cardiology

## 2021-03-22 ENCOUNTER — Other Ambulatory Visit: Payer: Self-pay

## 2021-03-22 ENCOUNTER — Encounter (HOSPITAL_COMMUNITY): Payer: Self-pay | Admitting: Cardiology

## 2021-03-22 VITALS — BP 128/70 | HR 76 | Ht 73.0 in | Wt 226.8 lb

## 2021-03-22 DIAGNOSIS — I5042 Chronic combined systolic (congestive) and diastolic (congestive) heart failure: Secondary | ICD-10-CM | POA: Diagnosis not present

## 2021-03-22 DIAGNOSIS — Z8249 Family history of ischemic heart disease and other diseases of the circulatory system: Secondary | ICD-10-CM | POA: Insufficient documentation

## 2021-03-22 DIAGNOSIS — I4892 Unspecified atrial flutter: Secondary | ICD-10-CM | POA: Insufficient documentation

## 2021-03-22 DIAGNOSIS — I110 Hypertensive heart disease with heart failure: Secondary | ICD-10-CM | POA: Diagnosis not present

## 2021-03-22 DIAGNOSIS — Z7901 Long term (current) use of anticoagulants: Secondary | ICD-10-CM | POA: Insufficient documentation

## 2021-03-22 DIAGNOSIS — D869 Sarcoidosis, unspecified: Secondary | ICD-10-CM

## 2021-03-22 DIAGNOSIS — I340 Nonrheumatic mitral (valve) insufficiency: Secondary | ICD-10-CM | POA: Insufficient documentation

## 2021-03-22 DIAGNOSIS — I2510 Atherosclerotic heart disease of native coronary artery without angina pectoris: Secondary | ICD-10-CM | POA: Diagnosis not present

## 2021-03-22 DIAGNOSIS — I509 Heart failure, unspecified: Secondary | ICD-10-CM | POA: Diagnosis not present

## 2021-03-22 DIAGNOSIS — Z79899 Other long term (current) drug therapy: Secondary | ICD-10-CM | POA: Insufficient documentation

## 2021-03-22 DIAGNOSIS — I428 Other cardiomyopathies: Secondary | ICD-10-CM | POA: Diagnosis not present

## 2021-03-22 LAB — COMPREHENSIVE METABOLIC PANEL
ALT: 30 U/L (ref 0–44)
AST: 26 U/L (ref 15–41)
Albumin: 4.2 g/dL (ref 3.5–5.0)
Alkaline Phosphatase: 55 U/L (ref 38–126)
Anion gap: 10 (ref 5–15)
BUN: 23 mg/dL — ABNORMAL HIGH (ref 6–20)
CO2: 22 mmol/L (ref 22–32)
Calcium: 9.3 mg/dL (ref 8.9–10.3)
Chloride: 106 mmol/L (ref 98–111)
Creatinine, Ser: 1.43 mg/dL — ABNORMAL HIGH (ref 0.61–1.24)
GFR, Estimated: 57 mL/min — ABNORMAL LOW (ref >60)
Glucose, Bld: 77 mg/dL (ref 70–99)
Potassium: 4.1 mmol/L (ref 3.5–5.1)
Sodium: 138 mmol/L (ref 135–145)
Total Bilirubin: 1.1 mg/dL (ref 0.3–1.2)
Total Protein: 7.5 g/dL (ref 6.5–8.1)

## 2021-03-22 NOTE — Progress Notes (Signed)
PCP: [name], MD Cardiology: Dr. [name] HF Cardiology: Dr. [name]  60 y.o. 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 [name] for evaluation of CHF. 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  Cardiolite in 5/16 showed no ischemia but possible apical infarction.  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  PYP scan was read as equivocal in 5/20 but probably not suggestive of transthyretin amyloidosis.   TEE was done in 6/20 to assess severity of MR.  EF was 50% with mild mitral valve prolapse and moderate MR.  RHC/LHC showed nonobstructive CAD, pulmonary venous hypertension, relatively controlled filling pressures. 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   Echo in 3/21 showed EF 55-60%, normal RV, severe LAE, moderate-severe MR.    Patient's BP has been poorly controlled over time.  He has not been consistent with medication compliance.  Renal artery dopplers were unremarkable in 6/16.   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   He had atrial flutter in 9/21 but has been back in NSR.   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  LHC/RHC in 2/22 showed 50% mLAD stenosis, 80% dLAD, prominent v-waves on PCWP tracing. High resolution CT chest in 3/22 showed numerous mediastinal and axillary lymph nodes,  consider sarcoidosis versus lymphoproliferative disease.  Cardiac PET in 4/22 at [facility] showed EF 42%, areas of active inflammation in LV myocardial concerning for cardiac sarcoidosis.  Repeat echo in 5/22 showed EF 50-55%, moderate LV enlargement, low normal RV function, moderate RV enlargement, moderate-severe MR with dilated IVC.   Patient was seen at the sarcoid clinic at [facility] by Dr. Weyman Croon.  PET was reviewed, he was suspected to have "burned out" sarcoid with minimal activity.  We are awaiting his lung biopsy (to be done later this month).  He was evaluated by cardiac surgery at [facility], MV repair/TV repair/Maze planned for 8/22.   Patient returns for followup of CHF.  Symptomatically stable.  No significant exertional dyspnea.  Weight is down 7 lbs.  No orthopnea/PND.  No chest pain.  No lightheadedness or palpitations. BP is controlled.   ECG (personally reviewed): NSR, LVH  Patient presents for followup of CHF.  BP is controlled.  Weight is stable.   Labs (5/20): K 3.5, creatinine 1.23 Labs (6/20): Urine immunofixation negative, K 4.2, creatinine 1.36 Labs (7/20): creatinine 1.43 Labs (9/20): uric acid 7.5, LDL 67 Labs (1/21): K 3.9, creatinine 1.33, BNP 388, ACE level low Labs (5/21): LDL 57 Labs (9/21): BNP 804, K 4.1, creatinine 1.42 Labs (12/21): K 4.8, creatinine 1.3 Labs (2/22): K 4.7, creatinine 1.07, ACE level 46 (normal) Labs (4/22): K 4.4, creatinine 1.32 Labs (5/22): LDL 48 Labs (6/22): K 3.9, creatinine 1.22  PMH: 1. Gout 2. H/o NSVT/PVCs: Zio patch 1/22 with rare PVCs, few short NSVT runs.  3. 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  Seen on multiple echoes, first in 2015.  4.  HTN: Renal artery dopplers in 6/16 were unremarkable. Sleep study about 2 years ago was negative per patient's report.  5. Mitral regurgitation: TEE in 2015 with mild to moderate MR.  - Echo (2/20): Moderate MR.  - Echo (5/20): Possible severe MR - TEE (6/20): Mild mitral valve prolapse with  moderate MR.  - Echo (3/21): Moderate to severe MR.  - TEE (9/21): moderate-severe MR with prolapse of A2 (eccentric MR).  - Cardiac MRI (1/22) with severe MR (regurgitant fraction 54%, regurgitant volume 83 cc).  - Echo (5/22): moderate-severe MR with prolapse A2 6. 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  - Cardiolite (5/16): Possible apical infarct, no ischemia.  - Echo (7/19): EF 50-55%.  - Echo (2/20): EF 50-55%, moderate MR, TV fibroelastoma.  - PYP scan (5/20): Grade 1 visually, H/CL 1.23.  Read as equivocal but likely negative for TTR amyloidosis.  - Echo (5/20): EF 40-45%, mildly dilated LV with mild LVH, normal RV size and systolic function, possible severe MR, moderate TR, 1 cm nodule on TV may be fibroelastoma, PASP 79 mmHg.  - TEE (6/20): EF 50%, mild LV dilation, mild LVH, moderate LAE, tricuspid valve mass likely fibroelastoma, mild MVP with moderate MR.  - LHC/RHC (6/20): 50% LAD, 30% pRCA; mean RA 4, PA 55/21 mean 34, mean PCWP 17, CI 2.66, PVR 2.8 WU.  - Cardiac MRI (8/20): EF 53%, mild LVH, inferoseptal/inferior mid-wall LGE (?cardiac sarcoidosis, does not look like amyloidosis), mild RV dilation with RVEF 54%, moderate eccentric MR, severe LAE. - Echo (3/21):  EF 55-60%, normal RV, severe LAE, moderate-severe MR. 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  - Cardiac MRI (1/22): moderate LV dilation with EF 45%, diffuse hypokinesis worse in the basal-mid inferior and inferoseptal walls, moderate RV dilation with EF 54%, severe MR with regurgitant fraction 54% and regurgitant volume 83 cc, mid-wall LGE in the basal inferoseptal and inferior walls concerning for possible cardiac sarcoidosis.  - RHC/LHC (2/22): 50% mid LAD, 80% dLAD; mean RA 6, PA 61/22 mean 39, mean PCWP 26 with v waves to 45, CI 2.69,  PVR 2.13 WU.  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 - Echo (5/22): EF 50-55%, moderate LV enlargement, low normal RV function, moderate RV enlargement, moderate-severe MR with dilated IVC.  7. Atrial flutter: Noted first in 9/21.  8. CAD: LHC (2/22) with 50% mid LAD, 80% distal LAD stenoses.  Medical management.  9. CT chest (3/22): Numerous mediastinal and axillary lymph nodes, some of which are enlarged. Findings can be seen with sarcoid or a lymphoproliferative disorder.  FH:  Father with MI at 25, brother with MI at 41, mother with CHF, MI.  HTN in multiple family members.   Social History   Socioeconomic History   Marital status: Divorced    Spouse name: Not on file   Number of children: Not on file   Years of education: Not on file   Highest education level: Not on file  Occupational History   Not on file  Tobacco Use   Smoking status: Never   Smokeless tobacco: Never  Vaping Use   Vaping Use: Never used  Substance and Sexual Activity   Alcohol use: Not Currently   Drug use: Never   Sexual activity: Yes  Other Topics Concern   Not on file  Social History Narrative   Not on file   Social Determinants of Health   Financial Resource Strain: Not on file  Food Insecurity: Not on file  Transportation Needs: Not on file  Physical Activity: Not on file  Stress: Not on file  Social Connections: Not on file  Intimate Partner Violence: Not on file   ROS: All systems reviewed and negative except as per HPI.   Current Outpatient Medications  Medication Sig Dispense Refill   acetaminophen (TYLENOL) 650 MG CR tablet Take 1,300 mg by mouth every 8 (eight) hours as needed for pain.     allopurinol (ZYLOPRIM) 100 MG tablet Take 1 tablet (100 mg total) by mouth 2 (two) times daily. 60 tablet 6   amLODipine (NORVASC) 10 MG tablet TAKE 1 TABLET (10 MG TOTAL) BY MOUTH DAILY WITH LUNCH (NOON)  (Patient taking differently: Take 10 mg by mouth daily.) 30 tablet 8   bisacodyl (DULCOLAX) 5 MG EC tablet Take 1 tablet (5 mg total) by mouth daily as needed for moderate constipation. 4 tablet 0   carvedilol (COREG) 25 MG tablet Take 1.5 tablets (37.5 mg total) by mouth 2 (two) times daily. 270 tablet 3   cetirizine (ZYRTEC) 10 MG tablet Take 10 mg by mouth daily as needed for allergies.     dicyclomine (BENTYL) 20 MG tablet Take 1 tablet (20 mg total) by mouth 2 (two) times daily. 20 tablet 0   docusate sodium (COLACE) 100 MG capsule Take 100 mg by mouth daily as needed for mild constipation.     ELIQUIS 5 MG TABS tablet TAKE 1 TABLET (5 MG TOTAL) BY MOUTH 2 (TWO) TIMES DAILY. (Patient taking differently: Take 5 mg by mouth 2 (two) times daily.) 60 tablet 3   Ensure Max Protein (ENSURE MAX PROTEIN) LIQD Take 330 mLs (11 oz total) by mouth daily. 330 mL 3   furosemide (LASIX) 20 MG tablet Take 2 tablets (40 mg total) by mouth 2 (two) times daily. (Patient not taking: No sig reported) 360 tablet 3   gabapentin (NEURONTIN) 300 MG capsule Take 300 mg by mouth daily as needed (nerve pain).     hydrALAZINE (APRESOLINE) 100 MG tablet Take 1 tablet (100 mg total) by mouth 3 (three) times daily. 270 tablet 3   isosorbide mononitrate (IMDUR) 60 MG 24 hr tablet Take 1.5 tablets (90 mg total) by mouth daily. 135 tablet 3   losartan (COZAAR) 100 MG tablet Take 1 tablet (100 mg total) by mouth in the morning AND 0.5 tablets (50 mg total) every evening. 135 tablet 3   Multiple Vitamin (MULTIVITAMIN WITH MINERALS) TABS tablet Take 1 tablet by mouth daily. 30 tablet 0   polyethylene glycol powder (GLYCOLAX/MIRALAX) 17 GM/SCOOP powder Take 17 g by mouth daily as needed for mild constipation, moderate constipation or severe constipation.     potassium chloride (KLOR-CON) 10 MEQ tablet Take 10 mEq by mouth daily.     rosuvastatin (CRESTOR) 10 MG tablet TAKE 1 TABLET (10 MG TOTAL) BY MOUTH DAILY (BEDTIME) (Patient  taking differently: Take 10 mg by mouth at bedtime.) 90 tablet 3   spironolactone (ALDACTONE) 50 MG tablet Take 1 tablet (50 mg total) by mouth daily. 90 tablet 3   furosemide (LASIX) 40 MG tablet Take 40 mg by mouth 2 (two) times daily.     No current facility-administered medications for this encounter.   BP 128/70   Pulse 76   Ht 6'  1" (1.854 m)   Wt 102.9 kg (226 lb 12.8 oz)   SpO2 99%   BMI 29.92 kg/m  General: NAD Neck: No JVD, no thyromegaly or thyroid nodule.  Lungs: Clear to auscultation bilaterally with normal respiratory effort. CV: Nondisplaced PMI.  Heart regular S1/S2, no S3/S4, 3/6 HSM apex.  No peripheral edema.  No carotid bruit.  Normal pedal pulses.  Abdomen: Soft, nontender, no hepatosplenomegaly, no distention.  Skin: Intact without lesions or rashes.  Neurologic: Alert and oriented x 3.  Psych: Normal affect. Extremities: No clubbing or cyanosis.  HEENT: Normal.   Assessment/Plan: 1. HTN: Negative renal artery dopplers and sleep study in past.  On multiple agents. Now controlled.  - Unable to tolerate Entresto due to cough, continue losartan 100 qam/50 qpm.     - Continue amlodipine 10 mg daily.  - Continue hydralazine 100 mg tid and Imdur 90 mg daily.  - Continue spironolactone 50 mg daily.  - Continue Coreg 37.5 mg bid.  2. 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  Echo in 5/20 showed EF 40-45% with normal RV, possible severe mitral regurgitation, and severe pulmonary hypertension.  PYP scan in 5/20 was read as equivocal, but I suspect this is not suggestive of transthyretin amyloidosis. TEE in 5/20 showed EF 50%, mild LV dilation, moderate MR.  RHC/LHC showed mildly elevated PCWP, pulmonary venous hypertension, and nonobstructive CAD.  Cardiac MRI in 8/20 showed EF 53% and had an LGE pattern that was suggestive of possible cardiac sarcoidosis (not amyloidosis).  Echo in 3/21 showed EF up to 55-60%.  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  Cardiac MRI in 1/22 with moderate LV dilation with EF 45%, diffuse hypokinesis worse in the basal-mid inferior and inferoseptal walls, moderate RV dilation with EF 54%, severe MR with regurgitant fraction 54% and regurgitant volume 83 cc, mid-wall LGE in the basal inferoseptal and inferior walls concerning for possible cardiac sarcoidosis. LHC/RHC showed moderate CAD without interventional target, elevated PCWP with prominent v-waves.  Cardiac PET in 4/22 concerning for cardiac amyloidosis.  He is not volume overloaded today and weight is down, NYHA class II symptoms.  - Continue Lasix 40 mg bid with BMET today.  - Continue losartan 100 qam/50 qpm (does not tolerate Entresto).   - Continue Coreg 37.5 mg bid as above.  - Continue spironolactone 50 mg daily.  - Continue hydralazine 100 mg tid and Imdur 90 mg daily as above.  - Strong concern for cardiac sarcoidosis based on 4/22 cPET at [facility] and cMRI done here.  High resolution CT chest from 3/22 shows enlarged mediastinal and axillary lymph nodes (could be seen in sarcoidosis versus lymphoproliferative disease).   3. Mitral regurgitation: ?severe on 5/20 TTE but TEE in 6/20 showed mild mitral valve prolapse with only moderate MR.  Cardiac MRI in 8/20 also suggested moderate MR. TTE in 3/21 suggested moderate-severe MR. TEE in 9/21 with moderate-severe MR, A2 prolapse.  Repeat cardiac MRI in 1/22 clearly showed severe MR with regurgitant fraction 54% and regurgitant volume 83 cc.  He has a prominent murmur on exam. He has NYHA class II exertional dyspnea.  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 Immunosuppression would significantly complicate surgical healing.  He has been seen in the sarcoid clinic and  by cardiac surgery at  [facility] => current plan is to try to avoid immunosuppression and to do MV repair, TV repair, and Maze in 8/22.  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  5. CAD: Moderate CAD without interventional target on 2/22 cath, no chest pain.  - continue Crestor, good lipids in 5/22.  - He is on apixaban so no ASA.  6. Atrial flutter: In NSR today.    - Continue Eliquis 5 mg bid.  - Consider atrial flutter ablation if flutter returns. 7. Cardiac sarcoidosis: As above, strong suspicion for cardiac sarcoidosis based on cPET and cMRI.  He has concerning chest lymphadenopathy as well.  We do not yet have a tissue diagnosis.  As a likely separate diagnosis, he also has severe MR with mitral valve prolapse.  Coordinating treatment of these two issues is going to be a challenge, as it would be very difficult to heal from surgery while on steroids for CS.  He was seen by Dr. Weyman Croon at the [facility] cardiac sarcoidosis clinic.  The PET was reviewed, and it was thought that sarcoid is not particularly active (possible "burned out" sarcoidosis).  - We need a tissue diagnosis, plan for bronchoscopy/biopsy on 03/28/21.   - If biopsy shows sarcoidosis, will need treatment with prednisone.  Would aim for after surgery. If biopsy negative, repeat PET in 6 months.   Followup with me in 9/22.   Loralie Champagne 03/22/2021

## 2021-03-22 NOTE — Patient Instructions (Signed)
EKG done today.  Labs done today. We will contact you only if your labs are abnormal.  No medication changes were made. Please continue all current medications as prescribed.  Your physician recommends that you schedule a follow-up appointment [id number]  If you have any questions or concerns before your next appointment please send [place] a message through [place] or call our office at [phone].    TO LEAVE A MESSAGE FOR THE NURSE SELECT OPTION 2, PLEASE LEAVE A MESSAGE INCLUDING: YOUR NAME DATE OF BIRTH CALL BACK NUMBER REASON FOR CALL**this is important as we prioritize the call backs  YOU WILL RECEIVE A CALL BACK THE SAME DAY AS LONG AS YOU CALL BEFORE 4:00 PM   At the [facility], you and your health needs are our priority. As part of our continuing mission to provide you with exceptional heart care, we have created designated Provider Care Teams. These Care Teams include your primary Cardiologist (physician) and Advanced Practice Providers (APPs- Physician Assistants and Nurse Practitioners) who all work together to provide you with the care you need, when you need it.   You may see any of the following providers on your designated Care Team at your next follow up: Dr Glori Bickers Dr Haynes Kerns, NP Lyda Jester, [place] Audry Riles, PharmD   Please be sure to bring in all your medications bottles to every appointment.

## 2021-03-26 ENCOUNTER — Other Ambulatory Visit (HOSPITAL_COMMUNITY)
Admission: RE | Admit: 2021-03-26 | Discharge: 2021-03-26 | Disposition: A | Discharge status: Home / Self Care | Payer: Medicaid Other | Source: Ambulatory Visit | Attending: Pulmonary Disease | Admitting: Pulmonary Disease

## 2021-03-26 DIAGNOSIS — Z20822 Contact with and (suspected) exposure to covid-19: Secondary | ICD-10-CM | POA: Insufficient documentation

## 2021-03-26 DIAGNOSIS — Z01812 Encounter for preprocedural laboratory examination: Secondary | ICD-10-CM | POA: Insufficient documentation

## 2021-03-26 LAB — SARS CORONAVIRUS 2 (TAT 6-24 HRS): SARS Coronavirus 2: NEGATIVE

## 2021-03-27 ENCOUNTER — Ambulatory Visit: Payer: Medicaid Other | Admitting: Pulmonary Disease

## 2021-03-28 ENCOUNTER — Encounter (HOSPITAL_COMMUNITY): Payer: Self-pay | Admitting: Pulmonary Disease

## 2021-03-28 NOTE — Progress Notes (Signed)
DUE TO COVID-19 ONLY ONE VISITOR IS ALLOWED TO COME WITH YOU AND STAY IN THE WAITING ROOM ONLY DURING PRE OP AND PROCEDURE DAY OF SURGERY.   PCP/nternal Med - Dr Angelica Chessman Cardiologist -- Dr Fransico Him HF Cardiologiy - Dr Loralie Champagne  CT Chest x-ray - 11/21/20 EKG - 03/22/21 Stress Test - 01/20/19 ECHO - 01/24/21 Cardiac Cath - 11/09/20  Sleep Study -  Yes, 05/03/19 (Normal) CPAP - none  Blood Thinner Instructions:  Last dose of Eliquis was on 03/25/21 per patient.  Patient's meds are in a pill pack and he doesn't know the medications.  Told patient not to take any meds on DOS and we will give him the meds he needs per anesthesia protocol on DOS.  Anesthesia review: Yes  STOP now taking any Aspirin (unless otherwise instructed by your surgeon), Aleve, Naproxen, Ibuprofen, Motrin, Advil, Goody's, BC's, all herbal medications, fish oil, and all vitamins.   Coronavirus Screening Covid test on 03/26/21 was negative.  Do you have any of the following symptoms:  Cough yes/no: No Fever (>100.63F)  yes/no: No Runny nose yes/no: No Sore throat yes/no: No Difficulty breathing/shortness of breath  yes/no: No  Have you traveled in the last 14 days and where? yes/no: No  Patient verbalized understanding of instructions that were given via phone.

## 2021-03-28 NOTE — Anesthesia Preprocedure Evaluation (Addendum)
Anesthesia Evaluation  Patient identified by MRN, date of birth, ID band Patient awake    Reviewed: Allergy & Precautions, NPO status , Patient's Chart, lab work & pertinent test results, reviewed documented beta blocker date and time   Airway Mallampati: II  TM Distance: >3 FB Neck ROM: Full    Dental  (+) Dental Advisory Given, Poor Dentition, Missing, Chipped   Pulmonary sleep apnea ,    Pulmonary exam normal breath sounds clear to auscultation       Cardiovascular hypertension, Pt. on medications and Pt. on home beta blockers +CHF  Normal cardiovascular exam+ Valvular Problems/Murmurs MR and MVP  Rhythm:Regular Rate:Normal     Neuro/Psych negative neurological ROS     GI/Hepatic negative GI ROS, (+)     substance abuse  alcohol use,   Endo/Other  negative endocrine ROS  Renal/GU Renal InsufficiencyRenal disease     Musculoskeletal  (+) Arthritis ,   Abdominal   Peds  Hematology  (+) Blood dyscrasia (Eliquis), ,   Anesthesia Other Findings   Reproductive/Obstetrics                           Anesthesia Physical Anesthesia Plan  ASA: 3  Anesthesia Plan: General   Post-op Pain Management:    Induction: Intravenous  PONV Risk Score and Plan: 2 and Dexamethasone and Ondansetron  Airway Management Planned: Oral ETT  Additional Equipment:   Intra-op Plan:   Post-operative Plan: Extubation in OR  Informed Consent: I have reviewed the patients History and Physical, chart, labs and discussed the procedure including the risks, benefits and alternatives for the proposed anesthesia with the patient or authorized representative who has indicated his/her understanding and acceptance.     Dental advisory given  Plan Discussed with: CRNA  Anesthesia Plan Comments: (PAT note written 03/28/2021 by Myra Gianotti, PA-C. )      Anesthesia Quick Evaluation

## 2021-03-28 NOTE — Progress Notes (Signed)
Anesthesia Chart Review:  Case: [id number] Date/Time: 03/29/21 0730   Procedure: VIDEO BRONCHOSCOPY WITH ENDOBRONCHIAL ULTRASOUND   Anesthesia type: General   Diagnosis: Adenopathy [R59.9]   Pre-op diagnosis: adenopathy   Location: [facility] ROOM 2 / [place] [facility]   Surgeons: Garner Nash, DO       DISCUSSION: Patient is a 60 year old male scheduled for the above procedure.  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  Per cardiology notes, if biopsies are sarcoidosis, plan to treat with prednisone but likely after surgery (for MV & TV repair 05/07/21 at [facility]), and if biopsy negative repeat PET scan in 6 months.  [facility] surgeon Dr. Cheree Ditto is aware of surgery plans.  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 No significant OSA on 05/03/19 sleep study.   Last visit with Dr. [name] 03/22/2021.  He noted, "Patient was [facility] by Dr. Weyman Croon.  PET was reviewed, he was suspected to have "burned out" sarcoid with minimal activity.  We are awaiting his lung biopsy (to be done later this month).  He was evaluated by cardiac surgery at [facility], MV repair/TV repair/[place] planned for 8/22."  Patient symptomatically stable at this visit.  He was also evaluated by CT surgeon Dr. Fredrich Romans at [facility] who wrote, "Agree with plan to slowly evaluate sarcoid and amyloid prior to entertaining any surgical procedure."   Last Eliquis 03/25/2021.  03/26/2021 presurgical COVID-19 test negative.  Anesthesia team to evaluate on the day of surgery.   VS: Ht 6' (1.829 m)   Wt 99.8 kg   BMI 29.84 kg/m  BP Readings from Last 3 Encounters:  03/22/21 128/70  03/14/21 (!) 144/82  02/22/21 (!) 156/100   Pulse Readings from Last 3 Encounters:  03/22/21 76  03/14/21 72  02/22/21 79     PROVIDERS: [name], MD is  PCP Loralie Champagne, MD is HF cardiologist. He also saw Hiram Gash, MD with [facility]. Fransico Him, MD is cardiologist Durward Mallard, MD is CT surgeon Terald Sleeper). He also saw Fredrich Romans, MD with [facility].    LABS: Most recent lab results include: Lab Results  Component Value Date   WBC 2.3 (L) 11/09/2020   HGB 13.3 11/09/2020   HCT 39.0 11/09/2020   PLT 130 (L) 11/09/2020   GLUCOSE 77 03/22/2021   ALT 30 03/22/2021   AST 26 03/22/2021   NA 138 03/22/2021   K 4.1 03/22/2021   CL 106 03/22/2021   CREATININE 1.43 (H) 03/22/2021   BUN 23 (H) 03/22/2021   CO2 22 03/22/2021     Sleep Study 04/26/19: DIAGNOSIS: 1. Normal study with no significant sleep disordered breathing (AHI 1.6/hr). 2. No significant nocturnal hypoxemia. The lowest O2 sat was 80% and time spent with O2 sats < 88% was 0. 3. The patient slept for a total of 4 hours and 59 minutes. 4. Normal sleep onset latency and short REM sleep onset latency. 5. Moderate snoring was noted.   IMAGES: CT Chest high resolution 11/21/20: IMPRESSION: 1. Numerous mediastinal and axillary lymph nodes, some of which are enlarged. Findings can be seen with sarcoid or a lymphoproliferative disorder. No pulmonary parenchymal findings of sarcoid. 2. Coronary artery calcification. 3. Enlarged pulmonic trunk, indicative of pulmonary arterial hypertension.   EKG: 03/22/21:  Normal sinus rhythm Minimal voltage criteria for LVH, may be normal variant ( Sokolow-Lyon ) Borderline  ECG Confirmed by Cherlynn Kaiser [phone]) on 03/22/2021 2:48:15 PM   CV: Echo 01/24/21: IMPRESSIONS   1. Left ventricular ejection fraction, by estimation, is 50 to 55%. The  left ventricle has mildly decreased function. The left ventricle  demonstrates global hypokinesis. The left ventricular internal cavity size  was moderately dilated. Left ventricular  diastolic parameters are consistent with Grade II diastolic dysfunction  (pseudonormalization).  Elevated left atrial pressure. The average left  ventricular global longitudinal strain is -14.9 %. The global longitudinal  strain is abnormal.   2. Right ventricular systolic function is low normal. The right  ventricular size is moderately enlarged. There is normal pulmonary artery  systolic pressure.   3. Left atrial size was severely dilated.   4. Right atrial size was severely dilated.   5. The mitral valve is myxomatous. Severe mitral valve regurgitation. No  evidence of mitral stenosis. There is moderate late systolic prolapse of  the middle segment of the anterior leaflet of the mitral valve.   6. 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   7. The aortic valve is tricuspid. Aortic valve regurgitation is not  visualized. No aortic stenosis is present.   8. The inferior vena cava is dilated in size with <50% respiratory  variability, suggesting right atrial pressure of 15 mmHg.  - Comparison(s): Prior images reviewed side by side. The left ventricular  function is worsened. The left ventricular end systolic volume has  increased.    PET Myocardial Perfusion Scan with CT 12/28/20 ([facility]): FINAL COMMENTS  Metabolism    1- Cardiac PET/CT Metabolic Study with V-40 FDG 15. 0 mCi given IV and Scanned on PET Discovery MI: There are segments   with abnormal metabolism to indicate presence of inflammation/active inflammatory process in the LV myocardium. This   Involves 4 Segments out of 17 Segments. In The Proper Clinical Settings This Could Represent Sarcoidosis. However, Other   Non-Specific Active Inflamatory Process Such a Myocarditis Is Also a Possibility.  No evidence for abnormal metabolism in the   RV free wall.    Perfusion/Function    1- Gated Cardiac PET/CT Rest Myocardial Perfusion Study with Rb-82 : Reveals Normal Perfusion to The Myocardium.   Abnormal Left Ventricular Ejection Fraction at Rest as Well as Increased Left  Ventricular Volumes. Abnormal Left Ventricular   Systolic Function and Wall Motion at Rest. Normal Wall Thickening. Evidence of Increased Tracer Uptake Rb-82 in The Right   Ventricular Free Wall to Suggest RVH.    2- Cardiac CT Viewer: Evidence of Severe 3 Vessels Coronary Artery Calcifications.    3- Limited View of Lower Chest and Upper Abdomen PET/CT: Evidence of Hypermetabolic Bilateral Axilary Lymphnodes,   Metabolic Hilar Lymphnodes but at Blood Pool Level. Those Are Non-Specific. Correlate With Clinical Informatoin Which Is   Not Available During The Interpretatoin of This Study.    RHC/LHC 11/09/20: 1. Elevated PCWP with very prominent v-waves suggestive of significant mitral regurgitation. 2. Pulmonary venous hypertension. 3. Preserved cardiac output. 4. 50% mid LAD (no progression from prior cath), 80% far distal LAD stenosis.      MR Cardiac 10/10/20: IMPRESSION: 1. Moderate LV dilation with EF 45%. Diffuse hypokinesis worse in the basal to mid inferior and inferoseptal walls. 2.  Moderate RV dilation wtih normal systolic function, EF 98%. 3. Severe mitral regurgitation, regurgitant fraction 54% with regurgitant volume 83 mL 4. Delayed enhancement pattern with focal primarily mid-wall LGE in the basal inferoseptal and  inferior walls. This appears most consistent with cardiac sarcoidosis, but cannot rule out focal prior myocarditis. It is also possible to have nonspecific LGE in this site (at the RV insertion) in the setting of LV volume/pressure overload.    Long term event monitor 08/29/20-09/12/20: Study Highlights 1. Predominantly NSR. 2. 4 runs of NSVT, longest 8 beats. 3. 15 runs of SVT, longer 13 seconds. 4. No atrial fibrillation noted. 5. Rare PVCs.  Past Medical History:  Diagnosis Date   Arthritis    back   Benign essential HTN 01/19/2014   Renal Artery [place] 6/16:  No RAS, No AAA   Cardiomyopathy, dilated, nonischemic (HCC)    EF 45-50% bye echo  08/2015 and EF 50-55% by echo 10/2017   Chronic combined systolic and diastolic CHF (congestive heart failure) ([place]) 01/21/2014   Echocardiogram 5.2020    Echo 01/2019: EF 40-45, mild LVH, Gr 3 DD (restrictive filling), normal RVSF, MR may be severe, mod TR, 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, PASP 79   Gout    H/O atrial flutter 05/2020   but back to NSR   Mitral valve regurgitation    mild to moderate MR with MVP of ANMVL by echo 2019   PVC's (premature ventricular contractions)    nonsustained VT as well as PVCs - no ICD indicated due to EF 40-45%   Sleep apnea    pending second sleep study    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    Past Surgical History:  Procedure Laterality Date   ABDOMINAL SURGERY  at birth   BUBBLE STUDY  03/16/2019   Procedure: BUBBLE STUDY;  Surgeon: Larey Dresser, MD;  Location: [place];  Service: Cardiovascular;;   CARDIAC VALVE REPLACEMENT     CARDIOVERSION N/A 06/08/2020   Procedure: CARDIOVERSION;  Surgeon: Larey Dresser, MD;  Location: [facility] ENDOSCOPY;  Service: Cardiovascular;  Laterality: N/A;   RIGHT/LEFT HEART CATH AND CORONARY ANGIOGRAPHY N/A 03/16/2019   Procedure: RIGHT/LEFT HEART CATH AND CORONARY ANGIOGRAPHY;  Surgeon: Larey Dresser, MD;  Location: [place] CV LAB;  Service: Cardiovascular;  Laterality: N/A;   RIGHT/LEFT HEART CATH AND CORONARY ANGIOGRAPHY N/A 11/09/2020   Procedure: RIGHT/LEFT HEART CATH AND CORONARY ANGIOGRAPHY;  Surgeon: Larey Dresser, MD;  Location: [place] CV LAB;  Service: Cardiovascular;  Laterality: N/A;   TEE WITHOUT CARDIOVERSION N/A 01/25/2014   Procedure: TRANSESOPHAGEAL ECHOCARDIOGRAM (TEE);  Surgeon: [name], MD;  Location: [place];  Service: Cardiovascular;  Laterality: N/A;   TEE WITHOUT CARDIOVERSION N/A 09/14/2014   Procedure: TRANSESOPHAGEAL ECHOCARDIOGRAM (TEE);  Surgeon: Sueanne Margarita, MD;  Location: [facility] ENDOSCOPY;  Service: Cardiovascular;   Laterality: N/A;   TEE WITHOUT CARDIOVERSION N/A 03/16/2019   Procedure: TRANSESOPHAGEAL ECHOCARDIOGRAM (TEE);  Surgeon: Larey Dresser, MD;  Location: [facility] ENDOSCOPY;  Service: Cardiovascular;  Laterality: N/A;   TEE WITHOUT CARDIOVERSION N/A 06/08/2020   Procedure: TRANSESOPHAGEAL ECHOCARDIOGRAM (TEE);  Surgeon: Larey Dresser, MD;  Location: [facility] ENDOSCOPY;  Service: Cardiovascular;  Laterality: N/A;    MEDICATIONS: No current facility-administered medications for this encounter.    acetaminophen (TYLENOL) 650 MG CR tablet   allopurinol (ZYLOPRIM) 100 MG tablet   amLODipine (NORVASC) 10 MG tablet   bisacodyl (DULCOLAX) 5 MG EC tablet   carvedilol (COREG) 25 MG tablet   cetirizine (ZYRTEC) 10 MG tablet   dicyclomine (BENTYL) 20 MG tablet   docusate sodium (COLACE) 100 MG capsule   ELIQUIS 5 MG TABS tablet  Ensure Max Protein (ENSURE MAX PROTEIN) LIQD   furosemide (LASIX) 40 MG tablet   gabapentin (NEURONTIN) 300 MG capsule   hydrALAZINE (APRESOLINE) 100 MG tablet   isosorbide mononitrate (IMDUR) 60 MG 24 hr tablet   losartan (COZAAR) 100 MG tablet   Multiple Vitamin (MULTIVITAMIN WITH MINERALS) TABS tablet   polyethylene glycol powder (GLYCOLAX/MIRALAX) 17 GM/SCOOP powder   potassium chloride (KLOR-CON) 10 MEQ tablet   rosuvastatin (CRESTOR) 10 MG tablet   spironolactone (ALDACTONE) 50 MG tablet   furosemide (LASIX) 20 MG tablet    Myra Gianotti, PA-C Surgical Short Stay/Anesthesiology [facility] Phone [phone] [facility] Phone [phone] 03/28/2021 5:42 PM

## 2021-03-29 ENCOUNTER — Ambulatory Visit (HOSPITAL_COMMUNITY): Payer: Medicaid Other | Admitting: Certified Registered Nurse Anesthetist

## 2021-03-29 ENCOUNTER — Encounter (HOSPITAL_COMMUNITY): Payer: Self-pay | Admitting: Pulmonary Disease

## 2021-03-29 ENCOUNTER — Other Ambulatory Visit: Payer: Self-pay

## 2021-03-29 ENCOUNTER — Encounter (HOSPITAL_COMMUNITY)
Admission: RE | Disposition: A | Discharge status: Home / Self Care | Payer: Self-pay | Source: Home / Self Care | Attending: Pulmonary Disease

## 2021-03-29 ENCOUNTER — Ambulatory Visit (HOSPITAL_COMMUNITY)
Admission: RE | Admit: 2021-03-29 | Discharge: 2021-03-29 | Disposition: A | Discharge status: Home / Self Care | Payer: Medicaid Other | Attending: Pulmonary Disease | Admitting: Pulmonary Disease

## 2021-03-29 DIAGNOSIS — I5042 Chronic combined systolic (congestive) and diastolic (congestive) heart failure: Secondary | ICD-10-CM | POA: Insufficient documentation

## 2021-03-29 DIAGNOSIS — I110 Hypertensive heart disease with heart failure: Secondary | ICD-10-CM | POA: Diagnosis not present

## 2021-03-29 DIAGNOSIS — R590 Localized enlarged lymph nodes: Secondary | ICD-10-CM | POA: Diagnosis not present

## 2021-03-29 DIAGNOSIS — I4892 Unspecified atrial flutter: Secondary | ICD-10-CM | POA: Insufficient documentation

## 2021-03-29 DIAGNOSIS — Z952 Presence of prosthetic heart valve: Secondary | ICD-10-CM | POA: Insufficient documentation

## 2021-03-29 DIAGNOSIS — I272 Pulmonary hypertension, unspecified: Secondary | ICD-10-CM | POA: Diagnosis not present

## 2021-03-29 DIAGNOSIS — I4891 Unspecified atrial fibrillation: Secondary | ICD-10-CM | POA: Insufficient documentation

## 2021-03-29 DIAGNOSIS — R599 Enlarged lymph nodes, unspecified: Secondary | ICD-10-CM

## 2021-03-29 DIAGNOSIS — I340 Nonrheumatic mitral (valve) insufficiency: Secondary | ICD-10-CM | POA: Diagnosis not present

## 2021-03-29 DIAGNOSIS — N183 Chronic kidney disease, stage 3 unspecified: Secondary | ICD-10-CM | POA: Diagnosis not present

## 2021-03-29 DIAGNOSIS — Z7901 Long term (current) use of anticoagulants: Secondary | ICD-10-CM | POA: Diagnosis not present

## 2021-03-29 DIAGNOSIS — I130 Hypertensive heart and chronic kidney disease with heart failure and stage 1 through stage 4 chronic kidney disease, or unspecified chronic kidney disease: Secondary | ICD-10-CM | POA: Diagnosis not present

## 2021-03-29 DIAGNOSIS — G4730 Sleep apnea, unspecified: Secondary | ICD-10-CM | POA: Diagnosis not present

## 2021-03-29 DIAGNOSIS — Z79899 Other long term (current) drug therapy: Secondary | ICD-10-CM | POA: Insufficient documentation

## 2021-03-29 DIAGNOSIS — I420 Dilated cardiomyopathy: Secondary | ICD-10-CM | POA: Insufficient documentation

## 2021-03-29 HISTORY — PX: BRONCHIAL NEEDLE ASPIRATION BIOPSY: SHX5106

## 2021-03-29 HISTORY — PX: VIDEO BRONCHOSCOPY WITH ENDOBRONCHIAL ULTRASOUND: SHX6177

## 2021-03-29 HISTORY — DX: Unspecified osteoarthritis, unspecified site: M19.90

## 2021-03-29 LAB — CBC
HCT: 39.8 % (ref 39.0–52.0)
Hemoglobin: 13.4 g/dL (ref 13.0–17.0)
MCH: 32.8 pg (ref 26.0–34.0)
MCHC: 33.7 g/dL (ref 30.0–36.0)
MCV: 97.3 fL (ref 80.0–100.0)
Platelets: 98 10*3/uL — ABNORMAL LOW (ref 150–400)
RBC: 4.09 MIL/uL — ABNORMAL LOW (ref 4.22–5.81)
RDW: 13.6 % (ref 11.5–15.5)
WBC: 4 10*3/uL (ref 4.0–10.5)
nRBC: 0 % (ref 0.0–0.2)

## 2021-03-29 SURGERY — BRONCHOSCOPY, WITH EBUS
Anesthesia: General

## 2021-03-29 MED ORDER — CARVEDILOL 12.5 MG PO TABS
37.5000 mg | ORAL_TABLET | ORAL | Status: AC
  Administered 2021-03-29: 37.5 mg via ORAL
  Filled 2021-03-29: qty 3

## 2021-03-29 MED ORDER — SUGAMMADEX SODIUM 200 MG/2ML IV SOLN
INTRAVENOUS | Status: DC | PRN
  Administered 2021-03-29: 200 mg via INTRAVENOUS

## 2021-03-29 MED ORDER — ONDANSETRON HCL 4 MG/2ML IJ SOLN
4.0000 mg | Freq: Once | INTRAMUSCULAR | Status: AC | PRN
  Administered 2021-03-29: 4 mg via INTRAVENOUS

## 2021-03-29 MED ORDER — LIDOCAINE 2% (20 MG/ML) 5 ML SYRINGE
INTRAMUSCULAR | Status: DC | PRN
  Administered 2021-03-29: 80 mg via INTRAVENOUS

## 2021-03-29 MED ORDER — FENTANYL CITRATE (PF) 100 MCG/2ML IJ SOLN: 25.0000 ug | INTRAMUSCULAR | Status: DC | PRN

## 2021-03-29 MED ORDER — EPHEDRINE SULFATE-NACL 50-0.9 MG/10ML-% IV SOSY
PREFILLED_SYRINGE | INTRAVENOUS | Status: DC | PRN
  Administered 2021-03-29: 10 mg via INTRAVENOUS

## 2021-03-29 MED ORDER — PROPOFOL 10 MG/ML IV BOLUS
INTRAVENOUS | Status: DC | PRN
  Administered 2021-03-29: 160 mg via INTRAVENOUS

## 2021-03-29 MED ORDER — MIDAZOLAM HCL 5 MG/5ML IJ SOLN
INTRAMUSCULAR | Status: DC | PRN
  Administered 2021-03-29: 2 mg via INTRAVENOUS

## 2021-03-29 MED ORDER — CHLORHEXIDINE GLUCONATE 0.12 % MT SOLN: 15.0000 mL | Freq: Once | OROMUCOSAL | Status: AC

## 2021-03-29 MED ORDER — LACTATED RINGERS IV SOLN: INTRAVENOUS | Status: DC

## 2021-03-29 MED ORDER — PHENYLEPHRINE HCL-NACL 10-0.9 MG/250ML-% IV SOLN
INTRAVENOUS | Status: DC | PRN
  Administered 2021-03-29: 40 ug/min via INTRAVENOUS

## 2021-03-29 MED ORDER — CHLORHEXIDINE GLUCONATE 0.12 % MT SOLN
OROMUCOSAL | Status: AC
  Administered 2021-03-29: 15 mL via OROMUCOSAL
  Filled 2021-03-29: qty 15

## 2021-03-29 MED ORDER — ROCURONIUM BROMIDE 10 MG/ML (PF) SYRINGE
PREFILLED_SYRINGE | INTRAVENOUS | Status: DC | PRN
  Administered 2021-03-29: 10 mg via INTRAVENOUS
  Administered 2021-03-29: 60 mg via INTRAVENOUS

## 2021-03-29 MED ORDER — GLYCOPYRROLATE PF 0.2 MG/ML IJ SOSY
PREFILLED_SYRINGE | INTRAMUSCULAR | Status: DC | PRN
  Administered 2021-03-29: .2 mg via INTRAVENOUS

## 2021-03-29 MED ORDER — DEXAMETHASONE SODIUM PHOSPHATE 10 MG/ML IJ SOLN
INTRAMUSCULAR | Status: DC | PRN
  Administered 2021-03-29: 10 mg via INTRAVENOUS

## 2021-03-29 MED ORDER — FENTANYL CITRATE (PF) 100 MCG/2ML IJ SOLN
INTRAMUSCULAR | Status: DC | PRN
  Administered 2021-03-29: 50 ug via INTRAVENOUS

## 2021-03-29 SURGICAL SUPPLY — 30 items
BRUSH CYTOL CELLEBRITY 1.5X140 (MISCELLANEOUS) IMPLANT
CANISTER SUCT 3000ML PPV (MISCELLANEOUS) ×4 IMPLANT
CONT SPEC 4OZ CLIKSEAL STRL BL (MISCELLANEOUS) ×4 IMPLANT
COVER BACK TABLE 60X90IN (DRAPES) ×4 IMPLANT
COVER DOME SNAP 22 D (MISCELLANEOUS) ×4 IMPLANT
FORCEPS BIOP RJ4 1.8 (CUTTING FORCEPS) IMPLANT
GAUZE SPONGE 4X4 12PLY STRL (GAUZE/BANDAGES/DRESSINGS) ×4 IMPLANT
GLOVE BIO SURGEON STRL SZ7.5 (GLOVE) ×4 IMPLANT
GOWN STRL REUS W/ TWL LRG LVL3 (GOWN DISPOSABLE) ×2 IMPLANT
GOWN STRL REUS W/TWL LRG LVL3 (GOWN DISPOSABLE) ×4
KIT CLEAN ENDO COMPLIANCE (KITS) ×8 IMPLANT
KIT TURNOVER KIT B (KITS) ×4 IMPLANT
MARKER SKIN DUAL TIP RULER LAB (MISCELLANEOUS) ×4 IMPLANT
NEEDLE EBUS SONO TIP PENTAX (NEEDLE) ×4 IMPLANT
NS IRRIG 1000ML POUR BTL (IV SOLUTION) ×4 IMPLANT
OIL SILICONE PENTAX (PARTS (SERVICE/REPAIRS)) ×4 IMPLANT
PAD ARMBOARD 7.5X6 YLW CONV (MISCELLANEOUS) ×8 IMPLANT
SOL ANTI FOG 6CC (MISCELLANEOUS) ×2 IMPLANT
SOLUTION ANTI FOG 6CC (MISCELLANEOUS) ×2
SYR 20CC LL (SYRINGE) ×8 IMPLANT
SYR 20ML ECCENTRIC (SYRINGE) ×8 IMPLANT
SYR 50ML SLIP (SYRINGE) IMPLANT
SYR 5ML LUER SLIP (SYRINGE) ×4 IMPLANT
TOWEL OR 17X24 6PK STRL BLUE (TOWEL DISPOSABLE) ×4 IMPLANT
TRAP SPECIMEN MUCOUS 40CC (MISCELLANEOUS) IMPLANT
TUBE CONNECTING 20'X1/4 (TUBING) ×2
TUBE CONNECTING 20X1/4 (TUBING) ×6 IMPLANT
UNDERPAD 30X30 (UNDERPADS AND DIAPERS) ×4 IMPLANT
VALVE DISPOSABLE (MISCELLANEOUS) ×4 IMPLANT
WATER STERILE IRR 1000ML POUR (IV SOLUTION) ×4 IMPLANT

## 2021-03-29 NOTE — Transfer of Care (Signed)
Immediate Anesthesia Transfer of Care Note  Patient: Daniel Joseph  Procedure(s) Performed: VIDEO BRONCHOSCOPY WITH ENDOBRONCHIAL ULTRASOUND BRONCHIAL NEEDLE ASPIRATION BIOPSIES  Patient Location: PACU  Anesthesia Type:General  Level of Consciousness: awake, alert  and oriented  Airway & Oxygen Therapy: Patient Spontanous Breathing  Post-op Assessment: Report given to RN, Post -op Vital signs reviewed and stable and Patient moving all extremities X 4  Post vital signs: Reviewed and stable  Last Vitals:  Vitals Value Taken Time  BP 140/93 03/29/21 0838  Temp 36.1 C 03/29/21 0838  Pulse 58 03/29/21 0849  Resp 19 03/29/21 0849  SpO2 99 % 03/29/21 0849  Vitals shown include unvalidated device data.  Last Pain:  Vitals:   03/29/21 0838  TempSrc:   PainSc: 0-No pain         Complications: No notable events documented.

## 2021-03-29 NOTE — Anesthesia Postprocedure Evaluation (Signed)
Anesthesia Post Note  Patient: Daniel Joseph  Procedure(s) Performed: VIDEO BRONCHOSCOPY WITH ENDOBRONCHIAL ULTRASOUND BRONCHIAL NEEDLE ASPIRATION BIOPSIES     Patient location during evaluation: PACU Anesthesia Type: General Level of consciousness: awake and alert Pain management: pain level controlled Vital Signs Assessment: post-procedure vital signs reviewed and stable Respiratory status: spontaneous breathing, nonlabored ventilation, respiratory function stable and patient connected to nasal cannula oxygen Cardiovascular status: blood pressure returned to baseline and stable Postop Assessment: no apparent nausea or vomiting Anesthetic complications: no   No notable events documented.  Last Vitals:  Vitals:   03/29/21 0853 03/29/21 0908  BP: (!) 141/96 (!) 145/98  Pulse: (!) 58 (!) 56  Resp: 17 17  Temp:  (!) 36.1 C  SpO2: 98% 98%    Last Pain:  Vitals:   03/29/21 0908  TempSrc:   PainSc: 0-No pain                 Catalina Gravel

## 2021-03-29 NOTE — Progress Notes (Signed)
Dr. Gifford Shave notified of pt's BP180/111, HR 63. Dr. Gifford Shave notified. No orders at this time.

## 2021-03-29 NOTE — Interval H&P Note (Signed)
History and Physical Interval Note:  03/29/2021 6:55 AM  Daniel Joseph  has presented today for surgery, with the diagnosis of adenopathy.  The various methods of treatment have been discussed with the patient and family. After consideration of risks, benefits and other options for treatment, the patient has consented to  Procedure(s): [place] (N/A) as a surgical intervention.  The patient's history has been reviewed, patient examined, no change in status, stable for surgery.  I have reviewed the patient's chart and labs.  Questions were answered to the patient's satisfaction.     [place]

## 2021-03-29 NOTE — Op Note (Signed)
Video Bronchoscopy with Endobronchial Ultrasound Procedure Note  Date of Operation: 03/29/2021  Pre-op Diagnosis: mediastinal adenopathy   Post-op Diagnosis: mediastinal adenopathy   Surgeon: Garner Nash, DO  Assistants: None   Anesthesia: General endotracheal anesthesia  Operation: Flexible video fiberoptic bronchoscopy with endobronchial ultrasound and biopsies.  Estimated Blood Loss: Minimal  Complications: None   Indications and History: Daniel Joseph is a 60 y.o. male with mediastinal adenopathy, cardiac imaging concern for possible underlying sarcoidosis.  The risks, benefits, complications, treatment options and expected outcomes were discussed with the patient.  The possibilities of pneumothorax, pneumonia, reaction to medication, pulmonary aspiration, perforation of a viscus, bleeding, failure to diagnose a condition and creating a complication requiring transfusion or operation were discussed with the patient who freely signed the consent.    Description of Procedure: The patient was examined in the preoperative area and history and data from the preprocedure consultation were reviewed. It was deemed appropriate to proceed.  The patient was taken to [facility] endoscopy room 2, identified as Daniel Joseph and the procedure verified as Flexible Video Fiberoptic Bronchoscopy.  A Time Out was held and the above information confirmed. After being taken to the operating room general anesthesia was initiated and the patient  was orally intubated. The video fiberoptic bronchoscope was introduced via the endotracheal tube and a general inspection was performed which showed normal right and left lung anatomy no evidence of endobronchial lesion. The standard scope was then withdrawn and the endobronchial ultrasound was used to identify and characterize the peritracheal, hilar and bronchial lymph nodes. Inspection showed small hilar, subcarinal and 4R lymph nodes. Using real-time ultrasound  guidance Wang needle biopsies were take from Station 7, 4R nodes and were sent for cytology. The patient tolerated the procedure well without apparent complications. There was no significant blood loss. The bronchoscope was withdrawn. Anesthesia was reversed and the patient was taken to the PACU for recovery.   Samples: 1. Wang needle biopsies from 7 node 2. Wang needle biopsies from 4R node  Plans:  The patient will be discharged from the PACU to home when recovered from anesthesia. We will review the cytology, pathology and microbiology results with the patient when they become available. Outpatient followup will be with Leory Plowman L Shonice Wrisley,DO .   Garner Nash, DO [facility] 03/29/2021 8:52 AM

## 2021-03-29 NOTE — Anesthesia Procedure Notes (Signed)
Procedure Name: Intubation Date/Time: 03/29/2021 7:38 AM Performed by: Kyung Rudd, CRNA Pre-anesthesia Checklist: Patient identified, Emergency Drugs available, Suction available and Patient being monitored Patient Re-evaluated:Patient Re-evaluated prior to induction Oxygen Delivery Method: Circle System Utilized Preoxygenation: Pre-oxygenation with 100% oxygen Induction Type: IV induction Ventilation: Mask ventilation without difficulty Laryngoscope Size: Mac and 4 Grade View: Grade I Tube type: Oral Tube size: 8.5 mm Number of attempts: 1 Airway Equipment and Method: Stylet and Oral airway Placement Confirmation: ETT inserted through vocal cords under direct vision, positive ETCO2 and breath sounds checked- equal and bilateral Secured at: 22 cm Tube secured with: Tape Dental Injury: Teeth and Oropharynx as per pre-operative assessment

## 2021-03-29 NOTE — Discharge Instructions (Signed)
Flexible Bronchoscopy, Care After This sheet gives you information about how to care for yourself after your test. Your doctor may also give you more specific instructions. If you have problems or questions, contact your doctor. Follow these instructions at home: Eating and drinking Do not eat or drink anything (not even water) for 2 hours after your test, or until your numbing medicine (local anesthetic) wears off. When your numbness is gone and your cough and gag reflexes have come back, you may: Eat only soft foods. Slowly drink liquids. The day after the test, go back to your normal diet. Driving Do not drive for 24 hours if you were given a medicine to help you relax (sedative). Do not drive or use heavy machinery while taking prescription pain medicine. General instructions  Take over-the-counter and prescription medicines only as told by your doctor. Return to your normal activities as told. Ask what activities are safe for you. Do not use any products that have nicotine or tobacco in them. This includes cigarettes and e-cigarettes. If you need help quitting, ask your doctor. Keep all follow-up visits as told by your doctor. This is important. It is very important if you had a tissue sample (biopsy) taken. Get help right away if: You have shortness of breath that gets worse. You get light-headed. You feel like you are going to pass out (faint). You have chest pain. You cough up: More than a little blood. More blood than before. Summary Do not eat or drink anything (not even water) for 2 hours after your test, or until your numbing medicine wears off. Do not use cigarettes. Do not use e-cigarettes. Get help right away if you have chest pain.  This information is not intended to replace advice given to you by your health care provider. Make sure you discuss any questions you have with your health care provider. Document Released: 06/30/2009 Document Revised: 08/15/2017 Document  Reviewed: 09/20/2016 Elsevier Patient Education  2020 [employer].

## 2021-03-30 LAB — CYTOLOGY - NON PAP

## 2021-04-02 ENCOUNTER — Telehealth: Payer: Self-pay | Admitting: Acute Care

## 2021-04-02 NOTE — Telephone Encounter (Signed)
I attempted to call the patient with the results of his biopsy.  There was no answer.  I left a message explaining that the biopsy did not reveal any malignant cells and that I would have the triage staff schedule the patient for follow-up with Dr. Valeta Harms or myself to go over these results in more detail. Triage please call patient and schedule for follow-up with either Dr. Valeta Harms or myself to review these results. Thank you

## 2021-04-03 NOTE — Telephone Encounter (Signed)
ATC Patient.  LM to call back. 

## 2021-04-04 ENCOUNTER — Telehealth: Payer: Self-pay | Admitting: Pulmonary Disease

## 2021-04-04 NOTE — Telephone Encounter (Signed)
Pt returning a phone call. Pt can be reached at [phone].

## 2021-04-04 NOTE — Telephone Encounter (Signed)
ATC LVMTCB x 2 

## 2021-04-04 NOTE — Telephone Encounter (Signed)
ATC pt left voicemail to return call to office.

## 2021-04-04 NOTE — Telephone Encounter (Signed)
Called patient but he did not answer. Left detailed message on his VM. Will leave this encounter open in case he calls back with more questions. He has an appt with Dr. Valeta Harms scheduled for late August. Offered to move this appt up sooner if possible.

## 2021-04-04 NOTE — Telephone Encounter (Signed)
Daniel Spatz, NP      7:18 PM Note I attempted to call the patient with the results of his biopsy.  There was no answer.  I left a message explaining that the biopsy did not reveal any malignant cells and that I would have the triage staff schedule the patient for follow-up with Dr. Valeta Harms or myself to go over these results in more detail. Triage please call patient and schedule for follow-up with either Dr. Valeta Harms or myself to review these results. Thank you      Tried calling the pt and there was no answer, LMTCB

## 2021-04-04 NOTE — Telephone Encounter (Signed)
See phone note from 7/20. A new phone note was opened when the pt called back as this encounter was already closed. Will continue to document on 7/20 encounter.

## 2021-04-05 NOTE — Telephone Encounter (Signed)
I attempted to call the patient again today just to make sure he did not have any additional questions..  There was no answer. I left him a message , in addition to the office contact number letting him know to  give [place] a call if he has any questions. Nothing further needed

## 2021-04-22 DIAGNOSIS — Z4682 Encounter for fitting and adjustment of non-vascular catheter: Secondary | ICD-10-CM | POA: Diagnosis not present

## 2021-04-22 DIAGNOSIS — I2510 Atherosclerotic heart disease of native coronary artery without angina pectoris: Secondary | ICD-10-CM | POA: Diagnosis not present

## 2021-04-22 DIAGNOSIS — I482 Chronic atrial fibrillation, unspecified: Secondary | ICD-10-CM | POA: Diagnosis not present

## 2021-04-22 DIAGNOSIS — I480 Paroxysmal atrial fibrillation: Secondary | ICD-10-CM | POA: Diagnosis not present

## 2021-04-22 DIAGNOSIS — Z79899 Other long term (current) drug therapy: Secondary | ICD-10-CM | POA: Diagnosis not present

## 2021-04-22 DIAGNOSIS — Z888 Allergy status to other drugs, medicaments and biological substances status: Secondary | ICD-10-CM | POA: Diagnosis not present

## 2021-04-22 DIAGNOSIS — J811 Chronic pulmonary edema: Secondary | ICD-10-CM | POA: Diagnosis not present

## 2021-04-22 DIAGNOSIS — I361 Nonrheumatic tricuspid (valve) insufficiency: Secondary | ICD-10-CM | POA: Diagnosis not present

## 2021-04-22 DIAGNOSIS — I110 Hypertensive heart disease with heart failure: Secondary | ICD-10-CM | POA: Diagnosis not present

## 2021-04-22 DIAGNOSIS — I5032 Chronic diastolic (congestive) heart failure: Secondary | ICD-10-CM | POA: Diagnosis not present

## 2021-04-22 DIAGNOSIS — I4892 Unspecified atrial flutter: Secondary | ICD-10-CM | POA: Diagnosis not present

## 2021-04-22 DIAGNOSIS — I340 Nonrheumatic mitral (valve) insufficiency: Secondary | ICD-10-CM | POA: Diagnosis not present

## 2021-04-22 DIAGNOSIS — E785 Hyperlipidemia, unspecified: Secondary | ICD-10-CM | POA: Diagnosis not present

## 2021-04-22 DIAGNOSIS — N179 Acute kidney failure, unspecified: Secondary | ICD-10-CM | POA: Diagnosis not present

## 2021-04-22 DIAGNOSIS — D649 Anemia, unspecified: Secondary | ICD-10-CM | POA: Diagnosis not present

## 2021-04-22 DIAGNOSIS — J984 Other disorders of lung: Secondary | ICD-10-CM | POA: Diagnosis not present

## 2021-04-22 DIAGNOSIS — Z7901 Long term (current) use of anticoagulants: Secondary | ICD-10-CM | POA: Diagnosis not present

## 2021-04-22 DIAGNOSIS — M109 Gout, unspecified: Secondary | ICD-10-CM | POA: Diagnosis not present

## 2021-04-22 DIAGNOSIS — I472 Ventricular tachycardia: Secondary | ICD-10-CM | POA: Diagnosis not present

## 2021-04-22 DIAGNOSIS — J9811 Atelectasis: Secondary | ICD-10-CM | POA: Diagnosis not present

## 2021-04-22 DIAGNOSIS — D8685 Sarcoid myocarditis: Secondary | ICD-10-CM | POA: Diagnosis not present

## 2021-04-22 DIAGNOSIS — Z20822 Contact with and (suspected) exposure to covid-19: Secondary | ICD-10-CM | POA: Diagnosis not present

## 2021-04-23 DIAGNOSIS — I480 Paroxysmal atrial fibrillation: Secondary | ICD-10-CM | POA: Diagnosis not present

## 2021-04-23 DIAGNOSIS — G8918 Other acute postprocedural pain: Secondary | ICD-10-CM | POA: Diagnosis not present

## 2021-04-23 DIAGNOSIS — I081 Rheumatic disorders of both mitral and tricuspid valves: Secondary | ICD-10-CM | POA: Diagnosis not present

## 2021-04-23 DIAGNOSIS — Z7901 Long term (current) use of anticoagulants: Secondary | ICD-10-CM | POA: Diagnosis not present

## 2021-04-23 DIAGNOSIS — I361 Nonrheumatic tricuspid (valve) insufficiency: Secondary | ICD-10-CM | POA: Diagnosis not present

## 2021-04-23 DIAGNOSIS — I340 Nonrheumatic mitral (valve) insufficiency: Secondary | ICD-10-CM | POA: Diagnosis not present

## 2021-04-23 DIAGNOSIS — I424 Endocardial fibroelastosis: Secondary | ICD-10-CM | POA: Diagnosis not present

## 2021-04-24 DIAGNOSIS — Z7901 Long term (current) use of anticoagulants: Secondary | ICD-10-CM | POA: Diagnosis not present

## 2021-04-24 DIAGNOSIS — Z4682 Encounter for fitting and adjustment of non-vascular catheter: Secondary | ICD-10-CM | POA: Diagnosis not present

## 2021-04-24 DIAGNOSIS — R918 Other nonspecific abnormal finding of lung field: Secondary | ICD-10-CM | POA: Diagnosis not present

## 2021-04-24 DIAGNOSIS — Z452 Encounter for adjustment and management of vascular access device: Secondary | ICD-10-CM | POA: Diagnosis not present

## 2021-04-25 DIAGNOSIS — I361 Nonrheumatic tricuspid (valve) insufficiency: Secondary | ICD-10-CM | POA: Diagnosis not present

## 2021-04-25 DIAGNOSIS — I517 Cardiomegaly: Secondary | ICD-10-CM | POA: Diagnosis not present

## 2021-04-25 DIAGNOSIS — J939 Pneumothorax, unspecified: Secondary | ICD-10-CM | POA: Diagnosis not present

## 2021-04-26 DIAGNOSIS — Z9889 Other specified postprocedural states: Secondary | ICD-10-CM | POA: Diagnosis not present

## 2021-04-26 DIAGNOSIS — I517 Cardiomegaly: Secondary | ICD-10-CM | POA: Diagnosis not present

## 2021-04-27 DIAGNOSIS — Z8679 Personal history of other diseases of the circulatory system: Secondary | ICD-10-CM | POA: Diagnosis not present

## 2021-04-27 DIAGNOSIS — Z9889 Other specified postprocedural states: Secondary | ICD-10-CM | POA: Diagnosis not present

## 2021-05-02 ENCOUNTER — Encounter (HOSPITAL_COMMUNITY): Payer: Self-pay | Admitting: *Deleted

## 2021-05-02 NOTE — Progress Notes (Signed)
Received referral notification from [place] NP at [facility] for this pt to participate in Cardiac Rehab s/p MV and TV repair on 8/8. Unfortunately referral orders must be signed by MD and not APP for insurance to reimburse for services received at Cardiac Rehab. MD referral, Medicaid reimbursement form and request for 12 lead ekg tracing sent to Dr. Cheree Ditto. Chucky has upcoming post hospitalization appt - 8/29 with [facility] and 9/15 with Dr. [name] here and 9/28 with [facility].  [place] will need to complete this follow up appt. Upon satisfactory review and the return of requested documents, will move forward in the scheduling process. Cherre Huger, BSN Cardiac and Training and development officer

## 2021-05-05 ENCOUNTER — Telehealth: Payer: Self-pay | Admitting: Home Health

## 2021-05-05 ENCOUNTER — Other Ambulatory Visit: Payer: Self-pay

## 2021-05-05 ENCOUNTER — Encounter (HOSPITAL_COMMUNITY): Payer: Self-pay

## 2021-05-05 ENCOUNTER — Emergency Department (HOSPITAL_COMMUNITY): Payer: Medicaid Other

## 2021-05-05 ENCOUNTER — Emergency Department (HOSPITAL_COMMUNITY)
Admission: EM | Admit: 2021-05-05 | Discharge: 2021-05-05 | Disposition: A | Discharge status: Home / Self Care | Payer: Medicaid Other | Attending: Emergency Medicine | Admitting: Emergency Medicine

## 2021-05-05 DIAGNOSIS — I509 Heart failure, unspecified: Secondary | ICD-10-CM | POA: Insufficient documentation

## 2021-05-05 DIAGNOSIS — Z7901 Long term (current) use of anticoagulants: Secondary | ICD-10-CM | POA: Diagnosis not present

## 2021-05-05 DIAGNOSIS — Z79899 Other long term (current) drug therapy: Secondary | ICD-10-CM | POA: Diagnosis not present

## 2021-05-05 DIAGNOSIS — R42 Dizziness and giddiness: Secondary | ICD-10-CM | POA: Insufficient documentation

## 2021-05-05 DIAGNOSIS — N183 Chronic kidney disease, stage 3 unspecified: Secondary | ICD-10-CM | POA: Insufficient documentation

## 2021-05-05 DIAGNOSIS — I130 Hypertensive heart and chronic kidney disease with heart failure and stage 1 through stage 4 chronic kidney disease, or unspecified chronic kidney disease: Secondary | ICD-10-CM | POA: Diagnosis not present

## 2021-05-05 LAB — CBC WITH DIFFERENTIAL/PLATELET
Abs Immature Granulocytes: 0.05 10*3/uL (ref 0.00–0.07)
Basophils Absolute: 0 10*3/uL (ref 0.0–0.1)
Basophils Relative: 0 %
Eosinophils Absolute: 0.2 10*3/uL (ref 0.0–0.5)
Eosinophils Relative: 3 %
HCT: 31.4 % — ABNORMAL LOW (ref 39.0–52.0)
Hemoglobin: 10.2 g/dL — ABNORMAL LOW (ref 13.0–17.0)
Immature Granulocytes: 1 %
Lymphocytes Relative: 22 %
Lymphs Abs: 1.2 10*3/uL (ref 0.7–4.0)
MCH: 32 pg (ref 26.0–34.0)
MCHC: 32.5 g/dL (ref 30.0–36.0)
MCV: 98.4 fL (ref 80.0–100.0)
Monocytes Absolute: 0.5 10*3/uL (ref 0.1–1.0)
Monocytes Relative: 9 %
Neutro Abs: 3.4 10*3/uL (ref 1.7–7.7)
Neutrophils Relative %: 65 %
Platelets: 279 10*3/uL (ref 150–400)
RBC: 3.19 MIL/uL — ABNORMAL LOW (ref 4.22–5.81)
RDW: 13.2 % (ref 11.5–15.5)
WBC: 5.2 10*3/uL (ref 4.0–10.5)
nRBC: 0 % (ref 0.0–0.2)

## 2021-05-05 LAB — COMPREHENSIVE METABOLIC PANEL
ALT: 13 U/L (ref 0–44)
AST: 19 U/L (ref 15–41)
Albumin: 3.1 g/dL — ABNORMAL LOW (ref 3.5–5.0)
Alkaline Phosphatase: 57 U/L (ref 38–126)
Anion gap: 9 (ref 5–15)
BUN: 11 mg/dL (ref 6–20)
CO2: 24 mmol/L (ref 22–32)
Calcium: 8.7 mg/dL — ABNORMAL LOW (ref 8.9–10.3)
Chloride: 101 mmol/L (ref 98–111)
Creatinine, Ser: 1.53 mg/dL — ABNORMAL HIGH (ref 0.61–1.24)
GFR, Estimated: 53 mL/min — ABNORMAL LOW (ref >60)
Glucose, Bld: 95 mg/dL (ref 70–99)
Potassium: 4.9 mmol/L (ref 3.5–5.1)
Sodium: 134 mmol/L — ABNORMAL LOW (ref 135–145)
Total Bilirubin: 1 mg/dL (ref 0.3–1.2)
Total Protein: 6.6 g/dL (ref 6.5–8.1)

## 2021-05-05 NOTE — ED Triage Notes (Signed)
Patient complains of bilateral ankle swelling and dizziness x 1 day. States that he had valve replacement at [facility] on 8/8 and has been home 1 week. Patient alert and oriented, denies weight gain. Appears in no acute distress

## 2021-05-05 NOTE — Telephone Encounter (Cosign Needed)
Patient paged after hour line, reporting that he has underwent valve replacement at [facility] recently, he was taking Lasix 40 mg twice daily as instructed by Dr. [name], [facility] cardiology told him to reduce Lasix to 20 mg twice daily after the procedure starting Saturday.  He has been experiencing increased dizziness, with current blood pressure 100/50, which is low for him.  He has been experiencing increased bilateral ankle swelling.  He denies any chest pain or shortness of breath or syncope.  He wants to know what can he do for his medication to help with the swelling as well as dizziness.  Complex patient with multiple comorbidities, unable to provide advice for medication adjustment based on phone call, advised patient go to the nearest ER for in person evaluation of vital sign, CHF, postop complication before further advice on medication adjustment can be provided.  Patient is agreeable.

## 2021-05-05 NOTE — Discharge Instructions (Addendum)
Take all your home medications as prescribed except the amlodipine (aka Norvasc), which was identified for you today.  Take a half a tablet once a day. Make sure you are staying well-hydrated with water. The heart failure clinic should be reaching out to you for reevaluation early this week.  If you have not heard from them on Monday, you should call the clinic to set up a follow-up appointment. Return to the emergency room if you develop chest pain, difficulty breathing, fevers, persistent dizziness, or any new, worsening, or concerning symptoms.

## 2021-05-05 NOTE — ED Notes (Signed)
This tech was going to bring pt to triage for EKG, however, pt stated that he was going to the bathroom first.

## 2021-05-05 NOTE — ED Provider Notes (Signed)
[facility] EMERGENCY DEPARTMENT Provider Note   CSN: [id number] Arrival date & time: 05/05/21  1200     History No chief complaint on file.   Daniel Joseph is a 60 y.o. male presenting for evaluation of dizziness and ankle swelling.  Patient states he had heart surgery at [facility] on 08/08.  He was doing well in the hospital, but on discharge, several of his medications were changed.  He states since then, he becomes dizzy every time he takes his medications.  Dizziness is worse when he goes from sitting to standing.  He states it feels like a lightheadedness like he is going to pass out.  He also reports bilateral ankle swelling.  He denies fever, headache, chest pain, shortness of breath, cough, nausea, vomiting, abdominal pain, normal bowel movements.  He reports good urinary output.  He follows with Dr. [name] with [facility] cardiology but was referred to [facility] cardiology just for the surgery. He call the cardiology office who recommended he be evaluated in the ED due to his recent complex medical history.   Additional history obtained from chart review.  I reviewed recent hospitalization at [facility] for mitral valve and tricuspid valve repair.   Patient with a history of sleep apnea, CHF, hypertension, A. fib on anticoagulation.  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, PASP 79   Gout    H/O atrial flutter 05/2020   but back to NSR   Mitral valve regurgitation    mild to moderate MR with MVP of  ANMVL by echo 2019   PVC's (premature ventricular contractions)    nonsustained VT as well as PVCs - no ICD indicated due to EF 40-45%   Sleep apnea    pending second sleep study    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    Patient Active Problem List   Diagnosis Date Noted   Adenopathy 03/14/2021   Neuropathy 02/02/2020   Nausea 02/02/2020   Acute exacerbation of CHF (congestive heart failure) ([name]) 08/29/2019   Alcohol abuse 08/29/2019   Hypoxic Respiratory failure, acute ([place]) 08/29/2019   PVC's (premature ventricular contractions)    HTN (hypertension) 03/02/2014   Chronic combined systolic and diastolic CHF (congestive heart failure) ([place]) 03/02/2014   Hypertensive cardiomyopathy ([place]) 03/02/2014   CKD (chronic kidney disease), stage III ([place]) 02/15/2014   Neck nodule 02/01/2014   Eye redness 02/01/2014   Mitral valve regurgitation 01/21/2014   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    Past Surgical History:  Procedure Laterality Date   ABDOMINAL SURGERY  at birth   BRONCHIAL NEEDLE ASPIRATION BIOPSY  03/29/2021   Procedure: BRONCHIAL NEEDLE ASPIRATION BIOPSIES;  Surgeon: Garner Nash, DO;  Location: [facility] ENDOSCOPY;  Service: Pulmonary;;   BUBBLE STUDY  03/16/2019   Procedure: BUBBLE STUDY;  Surgeon: Larey Dresser, MD;  Location: [facility] ENDOSCOPY;  Service: Cardiovascular;;   CARDIAC VALVE REPLACEMENT     CARDIOVERSION N/A  06/08/2020   Procedure: CARDIOVERSION;  Surgeon: Larey Dresser, MD;  Location: [facility] ENDOSCOPY;  Service: Cardiovascular;  Laterality: N/A;   RIGHT/LEFT HEART CATH AND CORONARY ANGIOGRAPHY N/A 03/16/2019   Procedure: RIGHT/LEFT HEART CATH AND CORONARY ANGIOGRAPHY;  Surgeon: Larey Dresser, MD;  Location: [place] CV LAB;  Service: Cardiovascular;  Laterality: N/A;   RIGHT/LEFT HEART CATH AND CORONARY ANGIOGRAPHY N/A 11/09/2020   Procedure: RIGHT/LEFT HEART CATH AND CORONARY ANGIOGRAPHY;  Surgeon: Larey Dresser, MD;  Location: [place] CV  LAB;  Service: Cardiovascular;  Laterality: N/A;   TEE WITHOUT CARDIOVERSION N/A 01/25/2014   Procedure: TRANSESOPHAGEAL ECHOCARDIOGRAM (TEE);  Surgeon: [name], MD;  Location: [facility];  Service: Cardiovascular;  Laterality: N/A;   TEE WITHOUT CARDIOVERSION N/A 09/14/2014   Procedure: TRANSESOPHAGEAL ECHOCARDIOGRAM (TEE);  Surgeon: Sueanne Margarita, MD;  Location: [facility] ENDOSCOPY;  Service: Cardiovascular;  Laterality: N/A;   TEE WITHOUT CARDIOVERSION N/A 03/16/2019   Procedure: TRANSESOPHAGEAL ECHOCARDIOGRAM (TEE);  Surgeon: Larey Dresser, MD;  Location: [facility] ENDOSCOPY;  Service: Cardiovascular;  Laterality: N/A;   TEE WITHOUT CARDIOVERSION N/A 06/08/2020   Procedure: TRANSESOPHAGEAL ECHOCARDIOGRAM (TEE);  Surgeon: Larey Dresser, MD;  Location: [facility] ENDOSCOPY;  Service: Cardiovascular;  Laterality: N/A;   VIDEO BRONCHOSCOPY WITH ENDOBRONCHIAL ULTRASOUND N/A 03/29/2021   Procedure: VIDEO BRONCHOSCOPY WITH ENDOBRONCHIAL ULTRASOUND;  Surgeon: Garner Nash, DO;  Location: [facility];  Service: Pulmonary;  Laterality: N/A;       Family History  Problem Relation Age of Onset   Heart Problems Mother    Stroke Mother    Heart failure Mother    Hypertension Mother    Heart Problems Father    Heart attack Father    Hypertension Father    Heart Problems Brother    Heart attack Brother    Hypertension Brother    Colon cancer Neg Hx    Esophageal cancer Neg Hx    Stomach cancer Neg Hx    Colon polyps Neg Hx    Rectal cancer Neg Hx     Social History   Tobacco Use   Smoking status: Never   Smokeless tobacco: Never  Vaping Use   Vaping Use: Never used  Substance Use Topics   Alcohol use: Not Currently   Drug use: Never    Home Medications Prior to Admission medications   Medication Sig Start Date End Date Taking? Authorizing Provider  acetaminophen (TYLENOL) 650 MG CR tablet Take 1,300 mg by mouth every 8 (eight) hours as needed for pain.   Yes [provider]  amLODipine (NORVASC) 10 MG tablet TAKE 1 TABLET (10 MG TOTAL) BY MOUTH DAILY WITH LUNCH (NOON) 12/05/20  Yes Larey Dresser, MD  bisacodyl (DULCOLAX) 5 MG EC tablet Take 1 tablet (5 mg total) by mouth daily as needed for moderate constipation. 03/23/19  Yes Danis, Kirke Corin, MD  carvedilol (COREG) 25 MG tablet Take 1.5 tablets (37.5 mg total) by mouth 2 (two) times daily. 02/09/21  Yes Larey Dresser, MD  ELIQUIS 5 MG TABS tablet TAKE 1 TABLET (5 MG TOTAL) BY MOUTH 2 (TWO) TIMES DAILY. 03/07/21  Yes Larey Dresser, MD  furosemide (LASIX) 20 MG tablet Take 2 tablets (40 mg total) by mouth 2 (two) times daily. 02/09/21  Yes Larey Dresser, MD  isosorbide mononitrate (IMDUR) 60 MG 24 hr tablet Take 1.5 tablets (90 mg total) by mouth daily. 11/02/20  Yes Larey Dresser, MD  losartan (COZAAR) 100 MG tablet  Take 1 tablet (100 mg total) by mouth in the morning AND 0.5 tablets (50 mg total) every evening. 12/05/20  Yes Larey Dresser, MD  Multiple Vitamin (MULTIVITAMIN WITH MINERALS) TABS tablet Take 1 tablet by mouth daily. 09/02/19  Yes Sheth, Devam P, MD  rosuvastatin (CRESTOR) 10 MG tablet TAKE 1 TABLET (10 MG TOTAL) BY MOUTH DAILY (BEDTIME) 03/07/21  Yes Larey Dresser, MD  spironolactone (ALDACTONE) 50 MG tablet Take 1 tablet (50 mg total) by mouth daily. 11/02/20  Yes Larey Dresser, MD  allopurinol (ZYLOPRIM) 100 MG tablet Take 1 tablet (100 mg total) by mouth 2 (two) times daily. Patient not taking: No sig reported 02/01/20   Azzie Glatter, FNP  dicyclomine (BENTYL) 20 MG tablet Take 1 tablet (20 mg total) by mouth 2 (two) times daily. Patient not taking: No sig reported 09/16/20   Henderly, Britni A, PA-C  Ensure Max Protein (ENSURE MAX PROTEIN) LIQD Take 330 mLs (11 oz total) by mouth daily. Patient not taking: No sig reported 09/01/19   Vicenta Dunning, MD  hydrALAZINE (APRESOLINE) 100 MG tablet Take 1 tablet (100 mg total) by mouth 3 (three) times daily. 11/02/20   Larey Dresser,  MD    Allergies    Delene Loll [sacubitril-valsartan] and Benazepril  Review of Systems   Review of Systems  Cardiovascular:  Positive for leg swelling.  Neurological:  Positive for dizziness.  All other systems reviewed and are negative.  Physical Exam Updated Vital Signs BP (!) 143/83 (BP Location: Right Arm)   Pulse (!) 58   Temp 97.6 F (36.4 C) (Oral)   Resp 16   SpO2 100%   Physical Exam Vitals and nursing note reviewed.  Constitutional:      General: He is not in acute distress.    Appearance: Normal appearance.     Comments: Resting in the bed in no acute distress  HENT:     Head: Normocephalic and atraumatic.  Eyes:     Conjunctiva/sclera: Conjunctivae normal.     Pupils: Pupils are equal, round, and reactive to light.  Cardiovascular:     Rate and Rhythm: Normal rate and regular rhythm.     Pulses: Normal pulses.  Pulmonary:     Effort: Pulmonary effort is normal. No respiratory distress.     Breath sounds: Normal breath sounds. No wheezing.     Comments: Speaking in full sentences.  Clear lung sounds in all fields. Incision sites noted on the right side of the chest wall appear to be healing well without signs of erythema, purulent drainage, or tenderness. Chest:     Chest wall: No tenderness.  Abdominal:     General: There is no distension.     Palpations: Abdomen is soft. There is no mass.     Tenderness: There is no abdominal tenderness. There is no guarding.  Musculoskeletal:        General: Normal range of motion.     Cervical back: Normal range of motion and neck supple.     Comments: No significant swelling noted on my exam, may be some mild swelling of bilateral ankles.  No tenderness palpation of the calves.  Skin:    General: Skin is warm and dry.     Capillary Refill: Capillary refill takes less than 2 seconds.  Neurological:     Mental Status: He is alert and oriented to person, place, and time.  Psychiatric:        Mood and Affect: Mood  and  affect normal.        Speech: Speech normal.        Behavior: Behavior normal.    ED Results / Procedures / Treatments   Labs (all labs ordered are listed, but only abnormal results are displayed) Labs Reviewed  CBC WITH DIFFERENTIAL/PLATELET - Abnormal; Notable for the following components:      Result Value   RBC 3.19 (*)    Hemoglobin 10.2 (*)    HCT 31.4 (*)    All other components within normal limits  COMPREHENSIVE METABOLIC PANEL - Abnormal; Notable for the following components:   Sodium 134 (*)    Creatinine, Ser 1.53 (*)    Calcium 8.7 (*)    Albumin 3.1 (*)    GFR, Estimated 53 (*)    All other components within normal limits    EKG EKG Interpretation  Date/Time:  Saturday May 05 2021 12:09:25 EDT Ventricular Rate:  58 PR Interval:  180 QRS Duration: 96 QT Interval:  440 QTC Calculation: 431 R Axis:   -30 Text Interpretation: Sinus bradycardia Left axis deviation Nonspecific T wave abnormality Confirmed by Lajean Saver [phone]) on 05/05/2021 12:28:30 PM  Radiology DG Chest 2 View  Result Date: 05/05/2021 CLINICAL DATA:  Pt had heart valve repair surgery at [place] on 04/23/2021 and now has bilateral ankle swelling X2 days and he states the medication he is on since his surgey are making him dizzy. EXAM: CHEST - 2 VIEW COMPARISON:  08/29/2019 FINDINGS: Cardiac silhouette mildly enlarged. To new prosthetic valves project over the central heart the frontal view. Normal mediastinal and hilar contours. Mild linear opacity at the right lung base consistent with atelectasis. Mild thickening along the minor fissure. Lungs otherwise clear. No pleural effusion or pneumothorax. Skeletal structures are intact. IMPRESSION: No acute cardiopulmonary disease. Electronically Signed   By: Lajean Manes M.D.   On: 05/05/2021 14:14    Procedures Procedures   Medications Ordered in ED Medications - No data to display  ED Course  I have reviewed the triage vital signs and the nursing  notes.  Pertinent labs & imaging results that were available during my care of the patient were reviewed by me and considered in my medical decision making (see chart for details).    MDM Rules/Calculators/A&P                           Patient presenting for evaluation of dizziness and leg swelling in the setting of recent medication changes after heart surgery.  On exam, patient appears nontoxic.  He does not appear in respiratory distress or significantly fluid overloaded.  Vital signs are stable on my evaluation.  Will obtain basic labs to check for anemia or electrolyte abnormalities.  X-ray to rule out fluid or infection. Case discussed with attending, Dr. Ashok Cordia agrees to plan.   Labs interpreted by me, overall reassuring.  Creatinine is minimally elevated from d/c, but not far from pts baseline. Cxr viewed and independently interpreted by me, no pna, pnx, effusion. Will consult with cardiology regarding medication management.   Discussed with Dr. Acie Fredrickson from cardiology who reviewed pt's med list. Recommends cutting amlodipine from '10mg'$  to '5mg'$ . He will reach out to the heart failure clinic to have pt seen in the next few days.   Discussed with pharmacy, pharmacist helped patient identify which pill in his pill pack it is the amlodipine and went over instructions to take half a tablet.  At this time, patient appears safe for discharge.  Return precautions given.  Patient states he understands and agrees to plan.  Final Clinical Impression(s) / ED Diagnoses Final diagnoses:  Orthostatic dizziness    Rx / DC Orders ED Discharge Orders     None        Franchot Heidelberg, PA-C 05/05/21 1508    Lajean Saver, MD 05/09/21 1531

## 2021-05-06 NOTE — Telephone Encounter (Signed)
Followup with him Monday, he went to ER and was sent home.  Needs to be seen in office.

## 2021-05-07 NOTE — Telephone Encounter (Signed)
Spoke w/pt, appt sch for 8/23 at 8:30

## 2021-05-07 NOTE — Progress Notes (Signed)
PCP: [name], MD Cardiology: Dr. [name] HF Cardiology: Dr. [name]  60 y.o. 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 [name] for evaluation of CHF. 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  Cardiolite in 5/16 showed no ischemia but possible apical infarction.  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  PYP scan was read as equivocal in 5/20 but probably not suggestive of transthyretin amyloidosis.   TEE was done in 6/20 to assess severity of MR.  EF was 50% with mild mitral valve prolapse and moderate MR.  RHC/LHC showed nonobstructive CAD, pulmonary venous hypertension, relatively controlled filling pressures. 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   Echo in 3/21 showed EF 55-60%, normal RV, severe LAE, moderate-severe MR.    Patient's BP has been poorly controlled over time.  He has not been consistent with medication compliance.  Renal artery dopplers were unremarkable in 6/16.   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   He had atrial flutter in 9/21 but has been back in NSR.   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  LHC/RHC in 2/22 showed 50% mLAD stenosis, 80% dLAD, prominent v-waves on PCWP tracing. High resolution CT chest in 3/22 showed numerous mediastinal and axillary lymph nodes,  consider sarcoidosis versus lymphoproliferative disease.  Cardiac PET in 4/22 at [facility] showed EF 42%, areas of active inflammation in LV myocardial concerning for cardiac sarcoidosis.  Repeat echo in 5/22 showed EF 50-55%, moderate LV enlargement, low normal RV function, moderate RV enlargement, moderate-severe MR with dilated IVC.   Patient was seen at the sarcoid clinic at [facility] by Dr. Weyman Croon.  PET was reviewed, he was suspected to have "burned out" sarcoid with minimal activity.  We are awaiting his lung biopsy (to be done later this month).  He was evaluated by cardiac surgery at [facility], MV repair/TV repair/Maze planned for 8/22.   Patient returns for followup of CHF 03/22/21.  Symptomatically stable.  No significant exertional dyspnea.  Weight is down 7 lbs.  No orthopnea/PND.  No chest pain.  No lightheadedness or palpitations. BP is controlled.  S/p MV repair/TV repair/Maze at [facility] 04/23/21. Tolerated surgery well. Arlyce Harman was stopped during this admission. He was in atrial fibrillation at discharge on 04/28/21.  Went to ED 8/22 for dizziness and swelling. Amlodipine decreased to 5 mg. CXR unremarkable.  Today he returns for acute HF visit. He has had dizziness and ankle swelling for several days, was seen in the ED and amlodipine decreased. Dizziness has improved. He does not have significant exertional dyspnea. Denies CP, or PND/Orthopnea. Appetite ok. Weight at home 226 pounds. Taking all medications.   ECG (personally reviewed): SB 59 bpm  Labs (5/20): K 3.5, creatinine 1.23 Labs (6/20): Urine immunofixation negative, K 4.2, creatinine 1.36 Labs (7/20): creatinine 1.43 Labs (9/20): uric acid 7.5, LDL 67 Labs (1/21): K 3.9, creatinine 1.33, BNP 388, ACE level low  Labs (5/21): LDL 57 Labs (9/21): BNP 804, K 4.1, creatinine 1.42 Labs (12/21): K 4.8, creatinine 1.3 Labs (2/22): K 4.7, creatinine 1.07, ACE level 46 (normal) Labs (4/22): K 4.4, creatinine 1.32 Labs (5/22): LDL 48 Labs (6/22): K  3.9, creatinine 1.22 Labs (8/22): K 4.9, creatinine 1.53, Hgb 10.2  PMH: 1. Gout 2. H/o NSVT/PVCs: Zio patch 1/22 with rare PVCs, few short NSVT runs.  3. 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  Seen on multiple echoes, first in 2015.  4. HTN: Renal artery dopplers in 6/16 were unremarkable. Sleep study about 2 years ago was negative per patient's report.  5. Mitral regurgitation: TEE in 2015 with mild to moderate MR.  - Echo (2/20): Moderate MR.  - Echo (5/20): Possible severe MR - TEE (6/20): Mild mitral valve prolapse with moderate MR.  - Echo (3/21): Moderate to severe MR.  - TEE (9/21): moderate-severe MR with prolapse of A2 (eccentric MR).  - Cardiac MRI (1/22) with severe MR (regurgitant fraction 54%, regurgitant volume 83 cc).  - Echo (5/22): moderate-severe MR with prolapse A2 - s/p MV repair/TV repair/Maze at [facility] 04/23/21.  6. 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  - Cardiolite (5/16): Possible apical infarct, no ischemia.  - Echo (7/19): EF 50-55%.  - Echo (2/20): EF 50-55%, moderate MR, TV fibroelastoma.  - PYP scan (5/20): Grade 1 visually, H/CL 1.23.  Read as equivocal but likely negative for TTR amyloidosis.  - Echo (5/20): EF 40-45%, mildly dilated LV with mild LVH, normal RV size and systolic function, possible severe MR, moderate TR, 1 cm nodule on TV may be fibroelastoma, PASP 79 mmHg.  - TEE (6/20): EF 50%, mild LV dilation, mild LVH, moderate LAE, tricuspid valve mass likely fibroelastoma, mild MVP with moderate MR.  - LHC/RHC (6/20): 50% LAD, 30% pRCA; mean RA 4, PA 55/21 mean 34, mean PCWP 17, CI 2.66, PVR 2.8 WU.  - Cardiac MRI (8/20): EF 53%, mild LVH, inferoseptal/inferior mid-wall LGE (?cardiac sarcoidosis, does not look like amyloidosis), mild RV dilation with RVEF 54%, moderate eccentric MR, severe LAE. - Echo (3/21):  EF 55-60%, normal RV, severe LAE, moderate-severe MR. 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  - Cardiac MRI (1/22): moderate LV dilation with EF 45%, diffuse hypokinesis worse in the basal-mid inferior and inferoseptal walls, moderate RV dilation with EF 54%, severe MR with regurgitant fraction 54% and regurgitant volume 83 cc, mid-wall LGE in the basal inferoseptal and inferior walls concerning for possible cardiac sarcoidosis.  - RHC/LHC (2/22): 50% mid LAD, 80% dLAD; mean RA 6, PA 61/22 mean 39, mean PCWP 26 with v waves to 45, CI 2.69, PVR 2.13 WU.  - Cardiac PET: 4/22 at [facility] showed EF 42%, areas of active inflammation in LV myocardial concerning for cardiac sarcoidosis. - Echo (5/22): EF 50-55%, moderate LV enlargement, low normal RV function, moderate RV enlargement, moderate-severe MR with dilated IVC.  7. Atrial flutter: Noted first in 9/21.  8. CAD: LHC (2/22) with 50% mid LAD, 80% distal LAD stenoses.  Medical management.  9. CT chest (3/22): Numerous mediastinal and axillary lymph nodes, some of which are enlarged. Findings can be seen with sarcoid or a lymphoproliferative disorder.  FH:  Father with MI at 75, brother with MI at 58, mother with CHF, MI.  HTN in multiple family members.  Social History   Socioeconomic History   Marital status: Divorced    Spouse name: Not on file   Number of children: Not on file   Years of education: Not on file   Highest education level: Not on file  Occupational History   Not on file  Tobacco Use   Smoking status: Never   Smokeless tobacco: Never  Vaping Use   Vaping Use: Never used  Substance and Sexual Activity   Alcohol use: Not Currently   Drug use: Never   Sexual activity: Yes  Other Topics Concern   Not on file  Social History Narrative   Not on file   Social Determinants of Health   Financial Resource Strain: Not on file  Food Insecurity: Not on file  Transportation  Needs: Not on file  Physical Activity: Not on file  Stress: Not on file  Social Connections: Not on file  Intimate Partner Violence: Not on file   ROS: All systems reviewed and negative except as per HPI.   Current Outpatient Medications  Medication Sig Dispense Refill   acetaminophen (TYLENOL) 650 MG CR tablet Take 1,300 mg by mouth every 8 (eight) hours as needed for pain.     allopurinol (ZYLOPRIM) 100 MG tablet Take 1 tablet (100 mg total) by mouth 2 (two) times daily. 60 tablet 6   amLODipine (NORVASC) 10 MG tablet TAKE 1 TABLET (10 MG TOTAL) BY MOUTH DAILY WITH LUNCH (NOON) 30 tablet 8   bisacodyl (DULCOLAX) 5 MG EC tablet Take 1 tablet (5 mg total) by mouth daily as needed for moderate constipation. 4 tablet 0   carvedilol (COREG) 25 MG tablet Take 1.5 tablets (37.5 mg total) by mouth 2 (two) times daily. 270 tablet 3   ELIQUIS 5 MG TABS tablet TAKE 1 TABLET (5 MG TOTAL) BY MOUTH 2 (TWO) TIMES DAILY. 60 tablet 3   furosemide (LASIX) 20 MG tablet Take 2 tablets (40 mg total) by mouth 2 (two) times daily. 360 tablet 3   hydrALAZINE (APRESOLINE) 100 MG tablet Take 1 tablet (100 mg total) by mouth 3 (three) times daily. 270 tablet 3   isosorbide mononitrate (IMDUR) 60 MG 24 hr tablet Take 1.5 tablets (90 mg total) by mouth daily. 135 tablet 3   losartan (COZAAR) 100 MG tablet Take 1 tablet (100 mg total) by mouth in the morning AND 0.5 tablets (50 mg total) every evening. 135 tablet 3   Multiple Vitamin (MULTIVITAMIN WITH MINERALS) TABS tablet Take 1 tablet by mouth daily. 30 tablet 0   potassium chloride (MICRO-K) 10 MEQ CR capsule Take 10 mEq by mouth 2 (two) times daily.     rosuvastatin (CRESTOR) 10 MG tablet TAKE 1 TABLET (10 MG TOTAL) BY MOUTH DAILY (BEDTIME) 90 tablet 3   spironolactone (ALDACTONE) 50 MG tablet Take 1 tablet (50 mg total) by mouth daily. 90 tablet 3   No current facility-administered medications for this encounter.   Wt Readings from Last 3 Encounters:  05/08/21  102.9 kg  05/05/21 99.8 kg  03/29/21 99.8 kg   BP 124/68   Pulse 62   Wt 102.9 kg   SpO2 99%   BMI 30.76 kg/m  General:  NAD. No resp difficulty HEENT: Normal Neck: Supple. JVP 6-7. Carotids 2+ bilat; no bruits. No lymphadenopathy or thryomegaly appreciated. Cor: PMI nondisplaced. Regular rate & rhythm. No rubs, gallops or murmurs. Lungs: Clear Abdomen: Soft, nontender, nondistended. No hepatosplenomegaly. No bruits or masses. Good bowel sounds. Right chest incisions well-healed. Extremities:  No cyanosis, clubbing, rash, edema Neuro: Alert & oriented x 3, cranial nerves grossly intact. Moves all 4 extremities w/o difficulty. Affect pleasant.  Assessment/Plan: 1. HTN: Negative renal artery dopplers and sleep study in past.  On multiple agents. Now controlled.  - Continue losartan 100 qam/50 qpm (Unable to tolerate Entresto due to cough).     - Continue amlodipine 5 mg daily (recently reduced due to dizziness).  - Continue hydralazine 100 mg tid and Imdur 90 mg daily.  - Continue spironolactone 50 mg daily. - Continue Coreg 37.5 mg bid.  2. 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  Echo in 5/20 showed EF 40-45% with normal RV, possible severe mitral regurgitation, and severe pulmonary hypertension.  PYP scan in 5/20 was read as equivocal, but I suspect this is not suggestive of transthyretin amyloidosis. TEE in 5/20 showed EF 50%, mild LV dilation, moderate MR.  RHC/LHC showed mildly elevated PCWP, pulmonary venous hypertension, and nonobstructive CAD.  Cardiac MRI in 8/20 showed EF 53% and had an LGE pattern that was suggestive of possible cardiac sarcoidosis (not amyloidosis).  Echo in 3/21 showed EF up to 55-60%.  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  Cardiac MRI in 1/22 with moderate LV dilation with EF 45%, diffuse  hypokinesis worse in the basal-mid inferior and inferoseptal walls, moderate RV dilation with EF 54%, severe MR with regurgitant fraction 54% and regurgitant volume 83 cc, mid-wall LGE in the basal inferoseptal and inferior walls concerning for possible cardiac sarcoidosis. LHC/RHC showed moderate CAD without interventional target, elevated PCWP with prominent v-waves.  Cardiac PET in 4/22 concerning for cardiac amyloidosis.  He is mildy volume overloaded today, ReDs 40%, NYHA class II-early III symptoms, however functional class difficult due to recent valve surgery.  - Increase Lasix to 60 mg bid. BMET and BNP today, repeat  BMET in 10 days. - Continue losartan 100 qam/50 qpm (does not tolerate Entresto).   - Continue Coreg 37.5 mg bid as above.  - Continue spironolactone 50 mg daily.  - Continue hydralazine 100 mg tid and Imdur 90 mg daily as above.  - Strong concern for cardiac sarcoidosis based on 4/22 cPET at [facility] and cMRI done here.  High resolution CT chest from 3/22 shows enlarged mediastinal and axillary lymph nodes (could be seen in sarcoidosis versus lymphoproliferative disease).   3. Mitral regurgitation: ?severe on 5/20 TTE but TEE in 6/20 showed mild mitral valve prolapse with only moderate MR.  Cardiac MRI in 8/20 also suggested moderate MR. TTE in 3/21 suggested moderate-severe MR. TEE in 9/21 with moderate-severe MR, A2 prolapse.  Repeat cardiac MRI in 1/22 clearly showed severe MR with regurgitant fraction 54% and regurgitant volume 83 cc.  He has had NYHA class II exertional dyspnea.  - He is now s/p MV repair/TV repair/Maze at [facility] 04/23/21.  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 Now s/p TVR. 5. CAD: Moderate CAD without interventional target on 2/22 cath, no chest pain.  - Continue Crestor, good lipids in 5/22.  - He is on apixaban so no ASA.  6. Atrial flutter: In SB today.    - Continue Eliquis 5 mg bid. No bleeding issues.  Check CBC today. - Consider atrial flutter ablation if flutter returns. 7. Cardiac sarcoidosis: As above, strong suspicion for cardiac sarcoidosis based on  cPET and cMRI.  He has concerning chest lymphadenopathy as well.  We do not yet have a tissue diagnosis.  As a likely separate diagnosis, he also has severe MR with mitral valve prolapse.  Coordinating treatment of these two issues is going to be a challenge, as it would be very difficult to heal from surgery while on steroids for CS.  He was seen by Dr. Weyman Croon at the [facility] cardiac sarcoidosis clinic.  The PET was reviewed, and it was thought that sarcoid is not particularly active (possible "burned out" sarcoidosis).  - We need a tissue diagnosis, bronchoscopy/biopsy on 03/29/21 yielded no malignant cells.   - If biopsy shows sarcoidosis, will need treatment with prednisone.  Would aim for after surgery. If biopsy negative, repeat PET in 6 months.   Followup with Dr. [name] next month as scheduled.  [place] FNP 05/08/2021 8:12 PM

## 2021-05-08 ENCOUNTER — Other Ambulatory Visit: Payer: Self-pay

## 2021-05-08 ENCOUNTER — Ambulatory Visit (HOSPITAL_COMMUNITY)
Admission: RE | Admit: 2021-05-08 | Discharge: 2021-05-08 | Disposition: A | Discharge status: Home / Self Care | Payer: Medicaid Other | Source: Ambulatory Visit | Attending: Family Medicine | Admitting: Family Medicine

## 2021-05-08 ENCOUNTER — Encounter (HOSPITAL_COMMUNITY): Payer: Self-pay

## 2021-05-08 VITALS — BP 124/68 | HR 62 | Wt 226.8 lb

## 2021-05-08 DIAGNOSIS — I2510 Atherosclerotic heart disease of native coronary artery without angina pectoris: Secondary | ICD-10-CM | POA: Diagnosis not present

## 2021-05-08 DIAGNOSIS — D869 Sarcoidosis, unspecified: Secondary | ICD-10-CM | POA: Diagnosis not present

## 2021-05-08 DIAGNOSIS — I110 Hypertensive heart disease with heart failure: Secondary | ICD-10-CM | POA: Diagnosis not present

## 2021-05-08 DIAGNOSIS — I428 Other cardiomyopathies: Secondary | ICD-10-CM | POA: Diagnosis not present

## 2021-05-08 DIAGNOSIS — I341 Nonrheumatic mitral (valve) prolapse: Secondary | ICD-10-CM | POA: Insufficient documentation

## 2021-05-08 DIAGNOSIS — Z9889 Other specified postprocedural states: Secondary | ICD-10-CM

## 2021-05-08 DIAGNOSIS — I10 Essential (primary) hypertension: Secondary | ICD-10-CM | POA: Diagnosis not present

## 2021-05-08 DIAGNOSIS — Z8249 Family history of ischemic heart disease and other diseases of the circulatory system: Secondary | ICD-10-CM | POA: Diagnosis not present

## 2021-05-08 DIAGNOSIS — Z79899 Other long term (current) drug therapy: Secondary | ICD-10-CM | POA: Insufficient documentation

## 2021-05-08 DIAGNOSIS — I4892 Unspecified atrial flutter: Secondary | ICD-10-CM | POA: Diagnosis not present

## 2021-05-08 DIAGNOSIS — Z7901 Long term (current) use of anticoagulants: Secondary | ICD-10-CM | POA: Insufficient documentation

## 2021-05-08 DIAGNOSIS — I5042 Chronic combined systolic (congestive) and diastolic (congestive) heart failure: Secondary | ICD-10-CM | POA: Diagnosis not present

## 2021-05-08 LAB — BASIC METABOLIC PANEL
Anion gap: 6 (ref 5–15)
BUN: 9 mg/dL (ref 6–20)
CO2: 25 mmol/L (ref 22–32)
Calcium: 9.1 mg/dL (ref 8.9–10.3)
Chloride: 105 mmol/L (ref 98–111)
Creatinine, Ser: 1.46 mg/dL — ABNORMAL HIGH (ref 0.61–1.24)
GFR, Estimated: 56 mL/min — ABNORMAL LOW (ref >60)
Glucose, Bld: 92 mg/dL (ref 70–99)
Potassium: 4.4 mmol/L (ref 3.5–5.1)
Sodium: 136 mmol/L (ref 135–145)

## 2021-05-08 LAB — CBC
HCT: 33.7 % — ABNORMAL LOW (ref 39.0–52.0)
Hemoglobin: 11.2 g/dL — ABNORMAL LOW (ref 13.0–17.0)
MCH: 32.6 pg (ref 26.0–34.0)
MCHC: 33.2 g/dL (ref 30.0–36.0)
MCV: 98 fL (ref 80.0–100.0)
Platelets: 259 10*3/uL (ref 150–400)
RBC: 3.44 MIL/uL — ABNORMAL LOW (ref 4.22–5.81)
RDW: 13.1 % (ref 11.5–15.5)
WBC: 3.6 10*3/uL — ABNORMAL LOW (ref 4.0–10.5)
nRBC: 0 % (ref 0.0–0.2)

## 2021-05-08 LAB — BRAIN NATRIURETIC PEPTIDE: B Natriuretic Peptide: 515.7 pg/mL — ABNORMAL HIGH (ref 0.0–100.0)

## 2021-05-08 MED ORDER — FUROSEMIDE 20 MG PO TABS: 60.0000 mg | ORAL_TABLET | Freq: Two times a day (BID) | ORAL | 3 refills | Status: DC

## 2021-05-08 NOTE — Patient Instructions (Signed)
INCREASE Lasix to 60 mg (3 tabs) twice a day  Labs today We will only contact you if something comes back abnormal or we need to make some changes. Otherwise no news is good news!  Labs needed in 7-10 days  Keep follow up as scheduled  Do the following things EVERYDAY: Weigh yourself in the morning before breakfast. Write it down and keep it in a log. Take your medicines as prescribed Eat low salt foods--Limit salt (sodium) to 2000 mg per day.  Stay as active as you can everyday Limit all fluids for the day to less than 2 liters  At the [facility], you and your health needs are our priority. As part of our continuing mission to provide you with exceptional heart care, we have created designated Provider Care Teams. These Care Teams include your primary Cardiologist (physician) and Advanced Practice Providers (APPs- Physician Assistants and Nurse Practitioners) who all work together to provide you with the care you need, when you need it.   You may see any of the following providers on your designated Care Team at your next follow up: Dr Glori Bickers Dr Loralie Champagne Dr Patrice Paradise, NP Lyda Jester, [place] Ginnie Smart Audry Riles, PharmD   Please be sure to bring in all your medications bottles to every appointment.

## 2021-05-08 NOTE — Progress Notes (Signed)
ReDS Vest / Clip - 05/08/21 0900       ReDS Vest / Clip   Station Marker D    Ruler Value 31    ReDS Value Range Moderate volume overload    ReDS Actual Value 40    Anatomical Comments sitting

## 2021-05-09 ENCOUNTER — Ambulatory Visit (INDEPENDENT_AMBULATORY_CARE_PROVIDER_SITE_OTHER): Payer: Medicaid Other | Admitting: Pulmonary Disease

## 2021-05-09 ENCOUNTER — Encounter: Payer: Self-pay | Admitting: Pulmonary Disease

## 2021-05-09 VITALS — BP 126/72 | HR 69 | Temp 98.2°F | Ht 73.0 in | Wt 225.0 lb

## 2021-05-09 DIAGNOSIS — I5042 Chronic combined systolic (congestive) and diastolic (congestive) heart failure: Secondary | ICD-10-CM | POA: Diagnosis not present

## 2021-05-09 DIAGNOSIS — R590 Localized enlarged lymph nodes: Secondary | ICD-10-CM

## 2021-05-09 DIAGNOSIS — D8685 Sarcoid myocarditis: Secondary | ICD-10-CM

## 2021-05-09 NOTE — Progress Notes (Signed)
Synopsis: Referred in June 2022 for abnormal ct imaging by [name], MD  Subjective:   PATIENT ID: Daniel Joseph GENDER: male DOB: [birth year], MRN: [id number]  Chief Complaint  Patient presents with   Follow-up    Pt states he has been doing okay since last visit and denies any complaints.    This is a 60 year old gentleman, history of cardiomyopathy, chronic systolic heart failure.  Also has sleep apnea.  Seen by Dr. [name] from the advanced heart failure service.  She had work-up for his cardiac abnormalities to include a cardiac MRI which showed mid left ventricular wall late gadolinium enhancement concerning for sarcoidosis.  Patient ultimately was sent to [facility] for a specialized cardiac PET scan.  This was also concerning for potential sarcoidosis.  He is found to have some small hilar bilateral lymph nodes that were PET avid as well as a small right-sided pretracheal node that had low-level PET uptake.  This concerning for sarcoidosis.  He has not had a tissue diagnosis.  As of this time.  Patient was referred to me for evaluation of bronchoscopy and tissue sampling of the lymph nodes present within the chest to confirm diagnosis of sarcoid.  He additionally is undergoing work-up at [facility] for consideration of valve surgery.  OV 05/09/2021: Here today for follow-up post bronchoscopy as well as post surgery that was completed at [facility].  Overall doing well today.  He has follow-up scheduled soon to see Dr. [name].  From respiratory standpoint he has no significant complaints.  Breathing well.  He is still sore.  Getting out and moving as much as possible.  We reviewed his bronc results.  Of note pathology was negative for any other evidence of sarcoidosis.  We discussed his previous scan in March and next steps.   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, PASP 79   Gout    H/O atrial flutter 05/2020   but back to NSR   Mitral valve regurgitation    mild to moderate MR with MVP of ANMVL by echo 2019   PVC's (premature ventricular contractions)    nonsustained VT as well as PVCs - no ICD indicated due to EF 40-45%   Sleep apnea    pending second sleep study    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     Family History  Problem Relation Age of Onset   Heart Problems Mother    Stroke Mother    Heart failure Mother    Hypertension Mother    Heart Problems Father    Heart attack Father    Hypertension Father    Heart Problems Brother    Heart attack Brother    Hypertension Brother    Colon cancer Neg Hx    Esophageal cancer Neg Hx    Stomach cancer Neg Hx    Colon polyps Neg Hx    Rectal cancer Neg Hx  Past Surgical History:  Procedure Laterality Date   ABDOMINAL SURGERY  at birth   BRONCHIAL NEEDLE ASPIRATION BIOPSY  03/29/2021   Procedure: BRONCHIAL NEEDLE ASPIRATION BIOPSIES;  Surgeon: Garner Nash, DO;  Location: [facility];  Service: Pulmonary;;   BUBBLE STUDY  03/16/2019   Procedure: BUBBLE STUDY;  Surgeon: Larey Dresser, MD;  Location: [place];  Service: Cardiovascular;;   CARDIAC VALVE REPLACEMENT     CARDIOVERSION N/A 06/08/2020   Procedure: CARDIOVERSION;  Surgeon: Larey Dresser, MD;  Location: [facility] ENDOSCOPY;  Service: Cardiovascular;  Laterality: N/A;   RIGHT/LEFT HEART CATH AND CORONARY ANGIOGRAPHY N/A 03/16/2019   Procedure: RIGHT/LEFT HEART CATH AND CORONARY ANGIOGRAPHY;  Surgeon: Larey Dresser, MD;  Location: [place]  CV LAB;  Service: Cardiovascular;  Laterality: N/A;   RIGHT/LEFT HEART CATH AND CORONARY ANGIOGRAPHY N/A 11/09/2020   Procedure: RIGHT/LEFT HEART CATH AND CORONARY ANGIOGRAPHY;  Surgeon: Larey Dresser, MD;  Location: [place] CV LAB;  Service: Cardiovascular;  Laterality: N/A;   TEE WITHOUT CARDIOVERSION N/A 01/25/2014   Procedure: TRANSESOPHAGEAL ECHOCARDIOGRAM (TEE);  Surgeon: [name], MD;  Location: [place];  Service: Cardiovascular;  Laterality: N/A;   TEE WITHOUT CARDIOVERSION N/A 09/14/2014   Procedure: TRANSESOPHAGEAL ECHOCARDIOGRAM (TEE);  Surgeon: Sueanne Margarita, MD;  Location: [facility] ENDOSCOPY;  Service: Cardiovascular;  Laterality: N/A;   TEE WITHOUT CARDIOVERSION N/A 03/16/2019   Procedure: TRANSESOPHAGEAL ECHOCARDIOGRAM (TEE);  Surgeon: Larey Dresser, MD;  Location: [facility] ENDOSCOPY;  Service: Cardiovascular;  Laterality: N/A;   TEE WITHOUT CARDIOVERSION N/A 06/08/2020   Procedure: TRANSESOPHAGEAL ECHOCARDIOGRAM (TEE);  Surgeon: Larey Dresser, MD;  Location: [facility] ENDOSCOPY;  Service: Cardiovascular;  Laterality: N/A;   VIDEO BRONCHOSCOPY WITH ENDOBRONCHIAL ULTRASOUND N/A 03/29/2021   Procedure: VIDEO BRONCHOSCOPY WITH ENDOBRONCHIAL ULTRASOUND;  Surgeon: Garner Nash, DO;  Location: [place];  Service: Pulmonary;  Laterality: N/A;    Social History   Socioeconomic History   Marital status: Divorced    Spouse name: Not on file   Number of children: Not on file   Years of education: Not on file   Highest education level: Not on file  Occupational History   Not on file  Tobacco Use   Smoking status: Never   Smokeless tobacco: Never  Vaping Use   Vaping Use: Never used  Substance and Sexual Activity   Alcohol use: Not Currently   Drug use: Never   Sexual activity: Yes  Other Topics Concern   Not on file  Social History Narrative   Not on file   Social Determinants of Health   Financial Resource Strain: Not on file  Food Insecurity: Not on file   Transportation Needs: Not on file  Physical Activity: Not on file  Stress: Not on file  Social Connections: Not on file  Intimate Partner Violence: Not on file     Allergies  Allergen Reactions   Entresto [Sacubitril-Valsartan] Cough   Benazepril Cough     Outpatient Medications Prior to Visit  Medication Sig Dispense Refill   acetaminophen (TYLENOL) 650 MG CR tablet Take 1,300 mg by mouth every 8 (eight) hours as needed for pain.     allopurinol (ZYLOPRIM) 100 MG tablet Take 1 tablet (100 mg total) by mouth 2 (two) times daily. 60 tablet 6   amiodarone (PACERONE) 200 MG tablet Take 200 mg by mouth daily.     amLODipine (NORVASC) 10 MG tablet TAKE 1 TABLET (10 MG TOTAL) BY MOUTH DAILY WITH LUNCH (NOON) (Patient taking  differently: 5 mg.) 30 tablet 8   bisacodyl (DULCOLAX) 5 MG EC tablet Take 1 tablet (5 mg total) by mouth daily as needed for moderate constipation. 4 tablet 0   carvedilol (COREG) 25 MG tablet Take 1.5 tablets (37.5 mg total) by mouth 2 (two) times daily. 270 tablet 3   ELIQUIS 5 MG TABS tablet TAKE 1 TABLET (5 MG TOTAL) BY MOUTH 2 (TWO) TIMES DAILY. 60 tablet 3   furosemide (LASIX) 20 MG tablet Take 3 tablets (60 mg total) by mouth 2 (two) times daily. 360 tablet 3   hydrALAZINE (APRESOLINE) 100 MG tablet Take 1 tablet (100 mg total) by mouth 3 (three) times daily. 270 tablet 3   isosorbide mononitrate (IMDUR) 60 MG 24 hr tablet Take 1.5 tablets (90 mg total) by mouth daily. 135 tablet 3   losartan (COZAAR) 100 MG tablet Take 1 tablet (100 mg total) by mouth in the morning AND 0.5 tablets (50 mg total) every evening. 135 tablet 3   Multiple Vitamin (MULTIVITAMIN WITH MINERALS) TABS tablet Take 1 tablet by mouth daily. 30 tablet 0   potassium chloride (MICRO-K) 10 MEQ CR capsule Take 10 mEq by mouth 2 (two) times daily.     rosuvastatin (CRESTOR) 10 MG tablet TAKE 1 TABLET (10 MG TOTAL) BY MOUTH DAILY (BEDTIME) 90 tablet 3   spironolactone (ALDACTONE) 50 MG tablet Take 1  tablet (50 mg total) by mouth daily. 90 tablet 3   No facility-administered medications prior to visit.    Review of Systems  Constitutional:  Negative for chills, fever, malaise/fatigue and weight loss.  HENT:  Negative for hearing loss, sore throat and tinnitus.   Eyes:  Negative for blurred vision and double vision.  Respiratory:  Negative for cough, hemoptysis, sputum production, shortness of breath, wheezing and stridor.   Cardiovascular:  Positive for chest pain. Negative for palpitations, orthopnea, leg swelling and PND.  Gastrointestinal:  Negative for abdominal pain, constipation, diarrhea, heartburn, nausea and vomiting.  Genitourinary:  Negative for dysuria, hematuria and urgency.  Musculoskeletal:  Negative for joint pain and myalgias.  Skin:  Negative for itching and rash.  Neurological:  Negative for dizziness, tingling, weakness and headaches.  Endo/Heme/Allergies:  Negative for environmental allergies. Does not bruise/bleed easily.  Psychiatric/Behavioral:  Negative for depression. The patient is not nervous/anxious and does not have insomnia.   All other systems reviewed and are negative.   Objective:  Physical Exam Vitals reviewed.  Constitutional:      General: He is not in acute distress.    Appearance: He is well-developed.  HENT:     Head: Normocephalic and atraumatic.  Eyes:     General: No scleral icterus.    Conjunctiva/sclera: Conjunctivae normal.     Pupils: Pupils are equal, round, and reactive to light.  Neck:     Vascular: No JVD.     Trachea: No tracheal deviation.  Cardiovascular:     Rate and Rhythm: Normal rate and regular rhythm.     Heart sounds: Normal heart sounds. No murmur heard.    Comments: Distant heart tones Pulmonary:     Effort: Pulmonary effort is normal. No tachypnea, accessory muscle usage or respiratory distress.     Breath sounds: No stridor. No wheezing, rhonchi or rales.  Abdominal:     General: Bowel sounds are normal.  There is no distension.     Palpations: Abdomen is soft.     Tenderness: There is no abdominal tenderness.  Musculoskeletal:  General: No tenderness.     Cervical back: Neck supple.  Lymphadenopathy:     Cervical: No cervical adenopathy.  Skin:    General: Skin is warm and dry.     Capillary Refill: Capillary refill takes less than 2 seconds.     Findings: No rash.  Neurological:     Mental Status: He is alert and oriented to person, place, and time.  Psychiatric:        Behavior: Behavior normal.     Vitals:   05/09/21 0918  BP: 126/72  Pulse: 69  Temp: 98.2 F (36.8 C)  TempSrc: Oral  SpO2: 100%  Weight: 225 lb (102.1 kg)  Height: '6\' 1"'$  (1.854 m)   100% on RA BMI Readings from Last 3 Encounters:  05/09/21 29.69 kg/m  05/08/21 30.76 kg/m  05/05/21 29.84 kg/m   Wt Readings from Last 3 Encounters:  05/09/21 225 lb (102.1 kg)  05/08/21 226 lb 12.8 oz (102.9 kg)  05/05/21 220 lb (99.8 kg)     CBC    Component Value Date/Time   WBC 3.6 (L) 05/08/2021 0929   RBC 3.44 (L) 05/08/2021 0929   HGB 11.2 (L) 05/08/2021 0929   HCT 33.7 (L) 05/08/2021 0929   PLT 259 05/08/2021 0929   MCV 98.0 05/08/2021 0929   MCH 32.6 05/08/2021 0929   MCHC 33.2 05/08/2021 0929   RDW 13.1 05/08/2021 0929   LYMPHSABS 1.2 05/05/2021 1222   MONOABS 0.5 05/05/2021 1222   EOSABS 0.2 05/05/2021 1222   BASOSABS 0.0 05/05/2021 1222     Chest Imaging: CT scan of the chest: 11/21/2020: Small paratracheal lymph node enlarged, small bilateral hilar adenopathy No significant parenchymal changes.  Concerning for sarcoidosis The patient's images have been independently reviewed by me.     Pulmonary Functions Testing Results: No flowsheet data found.  FeNO:   Pathology:   Echocardiogram:   Heart Catheterization:     Assessment & Plan:     ICD-10-CM   1. 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 Chronic combined  systolic and diastolic CHF (congestive heart failure) ([place])  I50.42       Discussion:  This is a 60 year old gentleman, cardiac MRI and cardiac PET imaging concerning for sarcoidosis.  Also had recent valve repair at [facility].  Initially saw me for bronchoscopy and endobronchial ultrasound with biopsy of his mediastinal nodes.  This pathology was nondiagnostic for sarcoid.  Plan: I think he needs follow-up regarding his mediastinal adenopathy. Will order a repeat CT scan of the chest to be completed in March 2023.  This will be 1 year after his previous CT. He needs to have continued follow-up with [facility] surgery as well as heart failure service here at [facility].    Current Outpatient Medications:    acetaminophen (TYLENOL) 650 MG CR tablet, Take 1,300 mg by mouth every 8 (eight) hours as needed for pain., Disp: , Rfl:    allopurinol (ZYLOPRIM) 100 MG tablet, Take 1 tablet (100 mg total) by mouth 2 (two) times daily., Disp: 60 tablet, Rfl: 6   amiodarone (PACERONE) 200 MG tablet, Take 200 mg by mouth daily., Disp: , Rfl:    amLODipine (NORVASC) 10 MG tablet, TAKE 1 TABLET (10 MG TOTAL) BY MOUTH DAILY WITH LUNCH (NOON) (Patient taking differently: 5 mg.), Disp: 30 tablet, Rfl: 8   bisacodyl (DULCOLAX) 5 MG EC tablet, Take 1 tablet (5 mg total) by mouth  daily as needed for moderate constipation., Disp: 4 tablet, Rfl: 0   carvedilol (COREG) 25 MG tablet, Take 1.5 tablets (37.5 mg total) by mouth 2 (two) times daily., Disp: 270 tablet, Rfl: 3   ELIQUIS 5 MG TABS tablet, TAKE 1 TABLET (5 MG TOTAL) BY MOUTH 2 (TWO) TIMES DAILY., Disp: 60 tablet, Rfl: 3   furosemide (LASIX) 20 MG tablet, Take 3 tablets (60 mg total) by mouth 2 (two) times daily., Disp: 360 tablet, Rfl: 3   hydrALAZINE (APRESOLINE) 100 MG tablet, Take 1 tablet (100 mg total) by mouth 3 (three) times daily., Disp: 270 tablet, Rfl: 3   isosorbide mononitrate (IMDUR) 60 MG 24 hr tablet, Take 1.5 tablets (90 mg total) by mouth daily., Disp: 135  tablet, Rfl: 3   losartan (COZAAR) 100 MG tablet, Take 1 tablet (100 mg total) by mouth in the morning AND 0.5 tablets (50 mg total) every evening., Disp: 135 tablet, Rfl: 3   Multiple Vitamin (MULTIVITAMIN WITH MINERALS) TABS tablet, Take 1 tablet by mouth daily., Disp: 30 tablet, Rfl: 0   potassium chloride (MICRO-K) 10 MEQ CR capsule, Take 10 mEq by mouth 2 (two) times daily., Disp: , Rfl:    rosuvastatin (CRESTOR) 10 MG tablet, TAKE 1 TABLET (10 MG TOTAL) BY MOUTH DAILY (BEDTIME), Disp: 90 tablet, Rfl: 3   spironolactone (ALDACTONE) 50 MG tablet, Take 1 tablet (50 mg total) by mouth daily., Disp: 90 tablet, Rfl: 3    Garner Nash, DO [facility] 05/09/2021 9:29 AM

## 2021-05-09 NOTE — Patient Instructions (Addendum)
Thank you for visiting Dr. Valeta Harms at [facility]. Today we recommend the following:  Orders Placed This Encounter  Procedures   CT CHEST HIGH RESOLUTION   CT Chest in March 2023 Follow up with [place] afterwards   Return in about 8 months (around 01/07/2022) for with APP or Dr. Valeta Harms.    Please do your part to reduce the spread of COVID-19.

## 2021-05-14 DIAGNOSIS — Z9889 Other specified postprocedural states: Secondary | ICD-10-CM | POA: Diagnosis not present

## 2021-05-14 DIAGNOSIS — Z952 Presence of prosthetic heart valve: Secondary | ICD-10-CM | POA: Diagnosis not present

## 2021-05-15 ENCOUNTER — Other Ambulatory Visit: Payer: Self-pay

## 2021-05-15 ENCOUNTER — Ambulatory Visit (HOSPITAL_COMMUNITY): Admission: RE | Admit: 2021-05-15 | Payer: Medicaid Other | Source: Ambulatory Visit

## 2021-05-16 ENCOUNTER — Ambulatory Visit (HOSPITAL_COMMUNITY)
Admission: RE | Admit: 2021-05-16 | Discharge: 2021-05-16 | Disposition: A | Discharge status: Home / Self Care | Payer: Medicaid Other | Source: Ambulatory Visit | Attending: Cardiology | Admitting: Cardiology

## 2021-05-16 DIAGNOSIS — I5042 Chronic combined systolic (congestive) and diastolic (congestive) heart failure: Secondary | ICD-10-CM | POA: Insufficient documentation

## 2021-05-16 LAB — BASIC METABOLIC PANEL
Anion gap: 9 (ref 5–15)
BUN: 27 mg/dL — ABNORMAL HIGH (ref 6–20)
CO2: 22 mmol/L (ref 22–32)
Calcium: 9.5 mg/dL (ref 8.9–10.3)
Chloride: 107 mmol/L (ref 98–111)
Creatinine, Ser: 1.77 mg/dL — ABNORMAL HIGH (ref 0.61–1.24)
GFR, Estimated: 44 mL/min — ABNORMAL LOW (ref >60)
Glucose, Bld: 99 mg/dL (ref 70–99)
Potassium: 4.5 mmol/L (ref 3.5–5.1)
Sodium: 138 mmol/L (ref 135–145)

## 2021-05-31 ENCOUNTER — Other Ambulatory Visit: Payer: Self-pay

## 2021-05-31 ENCOUNTER — Encounter (HOSPITAL_COMMUNITY): Payer: Self-pay | Admitting: Cardiology

## 2021-05-31 ENCOUNTER — Ambulatory Visit (HOSPITAL_COMMUNITY)
Admission: RE | Admit: 2021-05-31 | Discharge: 2021-05-31 | Disposition: A | Discharge status: Home / Self Care | Payer: Medicaid Other | Source: Ambulatory Visit | Attending: Cardiology | Admitting: Cardiology

## 2021-05-31 VITALS — BP 120/78 | HR 85 | Wt 223.2 lb

## 2021-05-31 DIAGNOSIS — D869 Sarcoidosis, unspecified: Secondary | ICD-10-CM

## 2021-05-31 DIAGNOSIS — I341 Nonrheumatic mitral (valve) prolapse: Secondary | ICD-10-CM | POA: Diagnosis not present

## 2021-05-31 DIAGNOSIS — I428 Other cardiomyopathies: Secondary | ICD-10-CM | POA: Diagnosis not present

## 2021-05-31 DIAGNOSIS — N183 Chronic kidney disease, stage 3 unspecified: Secondary | ICD-10-CM | POA: Diagnosis not present

## 2021-05-31 DIAGNOSIS — I5042 Chronic combined systolic (congestive) and diastolic (congestive) heart failure: Secondary | ICD-10-CM

## 2021-05-31 DIAGNOSIS — I2510 Atherosclerotic heart disease of native coronary artery without angina pectoris: Secondary | ICD-10-CM | POA: Diagnosis not present

## 2021-05-31 DIAGNOSIS — Z79899 Other long term (current) drug therapy: Secondary | ICD-10-CM | POA: Insufficient documentation

## 2021-05-31 DIAGNOSIS — Z8249 Family history of ischemic heart disease and other diseases of the circulatory system: Secondary | ICD-10-CM | POA: Diagnosis not present

## 2021-05-31 DIAGNOSIS — I130 Hypertensive heart and chronic kidney disease with heart failure and stage 1 through stage 4 chronic kidney disease, or unspecified chronic kidney disease: Secondary | ICD-10-CM | POA: Diagnosis not present

## 2021-05-31 DIAGNOSIS — I4892 Unspecified atrial flutter: Secondary | ICD-10-CM | POA: Diagnosis not present

## 2021-05-31 DIAGNOSIS — Z7901 Long term (current) use of anticoagulants: Secondary | ICD-10-CM | POA: Diagnosis not present

## 2021-05-31 DIAGNOSIS — Z9889 Other specified postprocedural states: Secondary | ICD-10-CM | POA: Diagnosis not present

## 2021-05-31 DIAGNOSIS — I4891 Unspecified atrial fibrillation: Secondary | ICD-10-CM | POA: Insufficient documentation

## 2021-05-31 LAB — CBC
HCT: 34.7 % — ABNORMAL LOW (ref 39.0–52.0)
Hemoglobin: 11.5 g/dL — ABNORMAL LOW (ref 13.0–17.0)
MCH: 31.9 pg (ref 26.0–34.0)
MCHC: 33.1 g/dL (ref 30.0–36.0)
MCV: 96.4 fL (ref 80.0–100.0)
Platelets: 138 K/uL — ABNORMAL LOW (ref 150–400)
RBC: 3.6 MIL/uL — ABNORMAL LOW (ref 4.22–5.81)
RDW: 14.2 % (ref 11.5–15.5)
WBC: 4 K/uL (ref 4.0–10.5)
nRBC: 0 % (ref 0.0–0.2)

## 2021-05-31 LAB — COMPREHENSIVE METABOLIC PANEL
ALT: 18 U/L (ref 0–44)
AST: 14 U/L — ABNORMAL LOW (ref 15–41)
Albumin: 4 g/dL (ref 3.5–5.0)
Alkaline Phosphatase: 65 U/L (ref 38–126)
Anion gap: 9 (ref 5–15)
BUN: 26 mg/dL — ABNORMAL HIGH (ref 6–20)
CO2: 23 mmol/L (ref 22–32)
Calcium: 9.3 mg/dL (ref 8.9–10.3)
Chloride: 107 mmol/L (ref 98–111)
Creatinine, Ser: 1.72 mg/dL — ABNORMAL HIGH (ref 0.61–1.24)
GFR, Estimated: 46 mL/min — ABNORMAL LOW (ref >60)
Glucose, Bld: 80 mg/dL (ref 70–99)
Potassium: 4.5 mmol/L (ref 3.5–5.1)
Sodium: 139 mmol/L (ref 135–145)
Total Bilirubin: 0.8 mg/dL (ref 0.3–1.2)
Total Protein: 7.3 g/dL (ref 6.5–8.1)

## 2021-05-31 LAB — TSH: TSH: 2.082 u[IU]/mL (ref 0.350–4.500)

## 2021-05-31 MED ORDER — FUROSEMIDE 40 MG PO TABS: 40.0000 mg | ORAL_TABLET | Freq: Two times a day (BID) | ORAL | 6 refills | Status: DC

## 2021-05-31 MED ORDER — HYDRALAZINE HCL 50 MG PO TABS: 50.0000 mg | ORAL_TABLET | Freq: Three times a day (TID) | ORAL | 3 refills | Status: DC

## 2021-05-31 NOTE — Patient Instructions (Signed)
STOP Amlodipine  DECREASE Hydralazine to '50mg'$  (1 tab) three times a day  DECREASE Lasix to '40mg'$  (1 tab) twice a day  Labs today We will only contact you if something comes back abnormal or we need to make some changes. Otherwise no news is good news!  Dr [name] recommends repeating the PET SCAN at [facility].  We will arrange for this study and call you with details.    You have been referred to Cardiac Rehab.  You will receive a call to schedule this appointment  Your physician recommends that you schedule a follow-up appointment in: 3 weeks with the Nurse Practitioner/ Physician Assistant  Please call office at [phone] option 2 if you have any questions or concerns.   At the [facility], you and your health needs are our priority. As part of our continuing mission to provide you with exceptional heart care, we have created designated Provider Care Teams. These Care Teams include your primary Cardiologist (physician) and Advanced Practice Providers (APPs- Physician Assistants and Nurse Practitioners) who all work together to provide you with the care you need, when you need it.   You may see any of the following providers on your designated Care Team at your next follow up: Dr Glori Bickers Dr Loralie Champagne Dr Patrice Paradise, NP Lyda Jester, [place] Ginnie Smart Audry Riles, PharmD   Please be sure to bring in all your medications bottles to every appointment.

## 2021-05-31 NOTE — Progress Notes (Signed)
PCP: [name], MD Cardiology: Dr. [name] HF Cardiology: Dr. [name]  60 y.o. 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 [name] for evaluation of CHF. 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  Cardiolite in 5/16 showed no ischemia but possible apical infarction.  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  PYP scan was read as equivocal in 5/20 but probably not suggestive of transthyretin amyloidosis.   TEE was done in 6/20 to assess severity of MR.  EF was 50% with mild mitral valve prolapse and moderate MR.  RHC/LHC showed nonobstructive CAD, pulmonary venous hypertension, relatively controlled filling pressures. 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   Echo in 3/21 showed EF 55-60%, normal RV, severe LAE, moderate-severe MR.    Patient's BP has been poorly controlled over time.  He has not been consistent with medication compliance.  Renal artery dopplers were unremarkable in 6/16.   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   He had atrial flutter in 9/21 but has been back in NSR.   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  LHC/RHC in 2/22 showed 50% mLAD stenosis, 80% dLAD, prominent v-waves on PCWP tracing. High resolution CT chest in 3/22 showed numerous mediastinal and axillary lymph nodes,  consider sarcoidosis versus lymphoproliferative disease.  Cardiac PET in 4/22 at [facility] showed EF 42%, areas of active inflammation in LV myocardial concerning for cardiac sarcoidosis.  Repeat echo in 5/22 showed EF 50-55%, moderate LV enlargement, low normal RV function, moderate RV enlargement, moderate-severe MR with dilated IVC.   Patient was seen at the sarcoid clinic at [facility] by Dr. Weyman Croon.  PET was reviewed, he was suspected to have "burned out" sarcoid with minimal activity.  Bronchoscopy with biopsy in 7/22 was negative for sarcoidosis.  He was evaluated by cardiac surgery at [facility], and had MV repair/TV repair/Maze in 8/22. Echo post-op in 8/22 showed EF > 55%, moderate LVH, normal RV, s/p MV repair with mean gradient 3 and no MR, s/p TV repair with mean gradient 1 and mild TR, PASP 57 mmHg.   Patient returns for followup of CHF.  He has had some lightheadedness with standing, no falls.  BP is not low today but he has not taken all his meds.  Breathing has overall improved.  No significant exertional dyspnea.  No orthopnea/PND.  No chest pain.   ECG (personally reviewed): NSR, LVH with repolarization  Patient presents for followup of CHF.  BP is controlled.  Weight is stable.   Labs (5/20): K 3.5, creatinine 1.23 Labs (6/20): Urine immunofixation negative, K 4.2, creatinine 1.36 Labs (7/20): creatinine 1.43 Labs (9/20): uric acid 7.5, LDL 67 Labs (1/21): K 3.9, creatinine 1.33, BNP 388, ACE level low Labs (5/21): LDL 57 Labs (9/21): BNP 804, K 4.1, creatinine 1.42 Labs (12/21): K 4.8, creatinine 1.3 Labs (2/22): K 4.7, creatinine 1.07, ACE level 46 (normal) Labs (4/22): K  4.4, creatinine 1.32 Labs (5/22): LDL 48 Labs (6/22): K 3.9, creatinine 1.22 Labs (8/22): K 4.5, creatinine 1.77  PMH: 1. Gout 2. H/o NSVT/PVCs: Zio patch 1/22 with rare PVCs, few short NSVT runs.  3. 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  Seen on multiple echoes, first in 2015.  4. HTN: Renal artery dopplers  in 6/16 were unremarkable. Sleep study about 2 years ago was negative per patient's report.  5. Mitral regurgitation: TEE in 2015 with mild to moderate MR.  - Echo (2/20): Moderate MR.  - Echo (5/20): Possible severe MR - TEE (6/20): Mild mitral valve prolapse with moderate MR.  - Echo (3/21): Moderate to severe MR.  - TEE (9/21): moderate-severe MR with prolapse of A2 (eccentric MR).  - Cardiac MRI (1/22) with severe MR (regurgitant fraction 54%, regurgitant volume 83 cc).  - Echo (5/22): moderate-severe MR with prolapse A2 - S/p MV repair at [facility] in 8/22.  Post-op echo in 8/22 with MV repair, mean gradient 3 mmHg, no MR.  6. 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  - Cardiolite (5/16): Possible apical infarct, no ischemia.  - Echo (7/19): EF 50-55%.  - Echo (2/20): EF 50-55%, moderate MR, TV fibroelastoma.  - PYP scan (5/20): Grade 1 visually, H/CL 1.23.  Read as equivocal but likely negative for TTR amyloidosis.  - Echo (5/20): EF 40-45%, mildly dilated LV with mild LVH, normal RV size and systolic function, possible severe MR, moderate TR, 1 cm nodule on TV may be fibroelastoma, PASP 79 mmHg.  - TEE (6/20): EF 50%, mild LV dilation, mild LVH, moderate LAE, tricuspid valve mass likely fibroelastoma, mild MVP with moderate MR.  - LHC/RHC (6/20): 50% LAD, 30% pRCA; mean RA 4, PA 55/21 mean 34, mean PCWP 17, CI 2.66, PVR 2.8 WU.  - Cardiac MRI (8/20): EF 53%, mild LVH, inferoseptal/inferior mid-wall LGE (?cardiac sarcoidosis, does not look like amyloidosis), mild RV dilation with RVEF 54%, moderate eccentric MR, severe LAE. - Echo (3/21):  EF 55-60%, normal RV, severe LAE, moderate-severe MR. 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  - Cardiac MRI (1/22): moderate LV dilation with EF 45%, diffuse hypokinesis  worse in the basal-mid inferior and inferoseptal walls, moderate RV dilation with EF 54%, severe MR with regurgitant fraction 54% and regurgitant volume 83 cc, mid-wall LGE in the basal inferoseptal and inferior walls concerning for possible cardiac sarcoidosis.  - RHC/LHC (2/22): 50% mid LAD, 80% dLAD; mean RA 6, PA 61/22 mean 39, mean PCWP 26 with v waves to 45, CI 2.69, PVR 2.13 WU.  - Cardiac PET: 4/22 at [facility] showed EF 42%, areas of active inflammation in LV myocardial concerning for cardiac sarcoidosis. - Bronchoscopy with biopsy in 7/22 did not show sarcoidosis.  - Echo (5/22): EF 50-55%, moderate LV enlargement, low normal RV function, moderate RV enlargement, moderate-severe MR with dilated IVC.  - Echo (8/22, post-MV/TV repair): EF > 55%, moderate LVH, normal RV, s/p MV repair with mean gradient 3 and no MR, s/p TV repair with mean gradient 1 and mild TR, PASP 57 mmHg. 7. Atrial flutter/fibrillation: Noted first in 9/21.  - S/p Maze in 8/22 with MV/TV surgery.  8. CAD: LHC (2/22) with 50% mid LAD, 80% distal LAD stenoses.  Medical management.  9. CT chest (3/22): Numerous mediastinal and axillary lymph nodes, some of which  are enlarged. Findings can be seen with sarcoid or a lymphoproliferative disorder. 10. Tricuspid regurgitation: TV repair in 8/22 with MV repair, mean gradient 1 mmHg with mild TR.  11. CKD stage 3  FH:  Father with MI at 79, brother with MI at 72, mother with CHF, MI.  HTN in multiple family members.   Social History   Socioeconomic History   Marital status: Divorced    Spouse name: Not on file   Number of children: Not on file   Years of education: Not on file   Highest education level: Not on file  Occupational History   Not on file  Tobacco Use   Smoking status: Never   Smokeless tobacco: Never  Vaping Use   Vaping Use: Never used  Substance and Sexual Activity   Alcohol use: Not Currently   Drug use: Never   Sexual activity: Yes  Other Topics  Concern   Not on file  Social History Narrative   Not on file   Social Determinants of Health   Financial Resource Strain: Not on file  Food Insecurity: Not on file  Transportation Needs: Not on file  Physical Activity: Not on file  Stress: Not on file  Social Connections: Not on file  Intimate Partner Violence: Not on file   ROS: All systems reviewed and negative except as per HPI.   Current Outpatient Medications  Medication Sig Dispense Refill   acetaminophen (TYLENOL) 650 MG CR tablet Take 1,300 mg by mouth every 8 (eight) hours as needed for pain.     allopurinol (ZYLOPRIM) 100 MG tablet Take 1 tablet (100 mg total) by mouth 2 (two) times daily. 60 tablet 6   amiodarone (PACERONE) 200 MG tablet Take 200 mg by mouth daily.     bisacodyl (DULCOLAX) 5 MG EC tablet Take 1 tablet (5 mg total) by mouth daily as needed for moderate constipation. 4 tablet 0   carvedilol (COREG) 25 MG tablet Take 1.5 tablets (37.5 mg total) by mouth 2 (two) times daily. 270 tablet 3   ELIQUIS 5 MG TABS tablet TAKE 1 TABLET (5 MG TOTAL) BY MOUTH 2 (TWO) TIMES DAILY. 60 tablet 3   hydrALAZINE (APRESOLINE) 50 MG tablet Take 1 tablet (50 mg total) by mouth 3 (three) times daily. 270 tablet 3   isosorbide mononitrate (IMDUR) 60 MG 24 hr tablet Take 1.5 tablets (90 mg total) by mouth daily. 135 tablet 3   losartan (COZAAR) 100 MG tablet Take 1 tablet (100 mg total) by mouth in the morning AND 0.5 tablets (50 mg total) every evening. 135 tablet 3   Multiple Vitamin (MULTIVITAMIN WITH MINERALS) TABS tablet Take 1 tablet by mouth daily. 30 tablet 0   potassium chloride (MICRO-K) 10 MEQ CR capsule Take 10 mEq by mouth 2 (two) times daily.     rosuvastatin (CRESTOR) 10 MG tablet TAKE 1 TABLET (10 MG TOTAL) BY MOUTH DAILY (BEDTIME) 90 tablet 3   spironolactone (ALDACTONE) 50 MG tablet Take 1 tablet (50 mg total) by mouth daily. 90 tablet 3   furosemide (LASIX) 40 MG tablet Take 1 tablet (40 mg total) by mouth 2 (two)  times daily. 60 tablet 6   No current facility-administered medications for this encounter.   BP 120/78   Pulse 85   Wt 101.2 kg (223 lb 3.2 oz)   SpO2 100%   BMI 29.45 kg/m  General: NAD Neck: No JVD, no thyromegaly or thyroid nodule.  Lungs: Clear to auscultation bilaterally with normal  respiratory effort. CV: Nondisplaced PMI.  Heart regular S1/S2, no S3/S4, no murmur.  No peripheral edema.  No carotid bruit.  Normal pedal pulses.  Abdomen: Soft, nontender, no hepatosplenomegaly, no distention.  Skin: Intact without lesions or rashes.  Neurologic: Alert and oriented x 3.  Psych: Normal affect. Extremities: No clubbing or cyanosis.  HEENT: Normal.   Assessment/Plan: 1. HTN: Negative renal artery dopplers and sleep study in past.  On multiple agents.  BP now is not elevated and patient has had episodes of lightheadedness with standing.   - Unable to tolerate Entresto due to cough, continue losartan 100 qam/50 qpm.     - He can stop amlodipine.  - Decrease hydralazine to 50 mg tid.  - Continue spironolactone 50 mg daily.  - Continue Coreg 37.5 mg bid.  2. 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  Echo in 5/20 showed EF 40-45% with normal RV, possible severe mitral regurgitation, and severe pulmonary hypertension.  PYP scan in 5/20 was read as equivocal, but I suspect this is not suggestive of transthyretin amyloidosis. TEE in 5/20 showed EF 50%, mild LV dilation, moderate MR.  RHC/LHC showed mildly elevated PCWP, pulmonary venous hypertension, and nonobstructive CAD.  Cardiac MRI in 8/20 showed EF 53% and had an LGE pattern that was suggestive of possible cardiac sarcoidosis (not amyloidosis).  Echo in 3/21 showed EF up to 55-60%.  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  Cardiac MRI in 1/22 with moderate LV dilation  with EF 45%, diffuse hypokinesis worse in the basal-mid inferior and inferoseptal walls, moderate RV dilation with EF 54%, severe MR with regurgitant fraction 54% and regurgitant volume 83 cc, mid-wall LGE in the basal inferoseptal and inferior walls concerning for possible cardiac sarcoidosis. LHC/RHC showed moderate CAD without interventional target, elevated PCWP with prominent v-waves.  Cardiac PET in 4/22 concerning for cardiac amyloidosis.  Bronchoscopy with biopsy in 7/22 was negative for sarcoidosis.  Most recent echo was post-MV/TV, showing EF > 55%, moderate LVH, normal RV, s/p MV repair with mean gradient 3 and no MR, s/p TV repair with mean gradient 1 and mild TR, PASP 57 mmHg.  He is not volume overloaded today with improved NYHA class II symptoms.  - I think that he can decrease Lasix to 40 mg bid.   - Continue losartan 100 qam/50 qpm (does not tolerate Entresto).   - Continue Coreg 37.5 mg bid as above.  - Continue spironolactone 50 mg daily.  - Decrease hydralazine to 50 mg tid and continue Imdur 90 mg daily as above.  - Strong concern for cardiac sarcoidosis based on 4/22 cPET at [facility] and cMRI done here.  High resolution CT chest from 3/22 shows enlarged mediastinal and axillary lymph nodes (could be seen in sarcoidosis versus lymphoproliferative disease).  However, bronchoscopy with biopsy was negative for sarcoidosis in 7/22.  Patient will need repeat cardiac PET in 10/22 to see if inflammation consistent with sarcoidosis has changed.  If worsened, may need immunosuppression.  If not, will continue current management without immunosuppression.  3. Mitral regurgitation: ?severe on 5/20 TTE but TEE in 6/20 showed mild mitral valve prolapse with only moderate MR.  Cardiac MRI in 8/20 also suggested moderate MR. TTE in 3/21 suggested moderate-severe MR. TEE in 9/21 with moderate-severe MR, A2 prolapse.  Repeat cardiac MRI in 1/22 clearly showed severe MR with  regurgitant fraction 54% and  regurgitant volume 83 cc.  In 8/22, patient had MV and TV repairs with Maze.  Post-op echo showed stable repaired MV in 8/22.  - Will need endocarditis prophylaxis with dental work.  - I will refer him for cardiac rehab.  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  5. CAD: Moderate CAD without interventional target on 2/22 cath, no chest pain.  - continue Crestor, good lipids in 5/22.  - He is on apixaban so no ASA.  6. Atrial flutter/fibrillation: S/p Maze with MV/TV repairs.  In NSR today.    - Continue Eliquis 5 mg bid.  - He is on amiodarone 200 mg daily.  If he remains in NSR in the future, should be able to stop this. Check LFTs, TSH today. Needs regular eye exam.  7. Cardiac sarcoidosis: As above, strong suspicion for cardiac sarcoidosis based on cPET and cMRI.  He has concerning chest lymphadenopathy as well.  We do not yet have a tissue diagnosis.  Bronchoscopy with biopsy in 7/22 was negative for sarcoidosis.  He was seen by Dr. Weyman Croon at the [facility] cardiac sarcoidosis clinic.  The PET was reviewed, and it was thought that sarcoid is not particularly active (possible "burned out" sarcoidosis).   - Plan for repeat PET in 10/22.  If inflammation has increased, would plan immunosuppression.  If inflammation is stable/decreased, no treatment.   Followup with APP in 3 wks.   Loralie Champagne 05/31/2021

## 2021-06-07 ENCOUNTER — Telehealth (HOSPITAL_COMMUNITY): Payer: Self-pay | Admitting: *Deleted

## 2021-06-07 NOTE — Telephone Encounter (Signed)
PET scan auth in clinical review

## 2021-06-15 ENCOUNTER — Telehealth (HOSPITAL_COMMUNITY): Payer: Self-pay

## 2021-06-15 NOTE — Telephone Encounter (Signed)
Pt insurance is active through [facility] Ref# [id number]   Will fax over Medicaid Reimbursement form to Dr. [name]   Will contact pt to see if he is interested in the Cardiac Rehab program. If interested, pt will need to complete f/u appt on. Once completed, pt will be contacted for scheduling upon review by the RN Navigator.

## 2021-06-21 ENCOUNTER — Telehealth (HOSPITAL_COMMUNITY): Payer: Self-pay

## 2021-06-22 ENCOUNTER — Encounter (HOSPITAL_COMMUNITY): Payer: Medicaid Other

## 2021-06-22 NOTE — Telephone Encounter (Signed)
Open end error

## 2021-06-28 ENCOUNTER — Encounter (HOSPITAL_COMMUNITY): Payer: Medicaid Other

## 2021-07-11 ENCOUNTER — Other Ambulatory Visit (HOSPITAL_COMMUNITY): Payer: Self-pay | Admitting: Cardiology

## 2021-07-11 DIAGNOSIS — I4892 Unspecified atrial flutter: Secondary | ICD-10-CM

## 2021-07-16 ENCOUNTER — Encounter (HOSPITAL_COMMUNITY): Payer: Self-pay

## 2021-07-16 ENCOUNTER — Ambulatory Visit (HOSPITAL_COMMUNITY)
Admission: RE | Admit: 2021-07-16 | Discharge: 2021-07-16 | Disposition: A | Discharge status: Home / Self Care | Payer: Medicaid Other | Source: Ambulatory Visit | Attending: Adult Health | Admitting: Adult Health

## 2021-07-16 ENCOUNTER — Other Ambulatory Visit: Payer: Self-pay

## 2021-07-16 VITALS — BP 142/76 | HR 73 | Wt 233.6 lb

## 2021-07-16 DIAGNOSIS — Z7901 Long term (current) use of anticoagulants: Secondary | ICD-10-CM | POA: Diagnosis not present

## 2021-07-16 DIAGNOSIS — I272 Pulmonary hypertension, unspecified: Secondary | ICD-10-CM | POA: Diagnosis not present

## 2021-07-16 DIAGNOSIS — Z8249 Family history of ischemic heart disease and other diseases of the circulatory system: Secondary | ICD-10-CM | POA: Insufficient documentation

## 2021-07-16 DIAGNOSIS — I341 Nonrheumatic mitral (valve) prolapse: Secondary | ICD-10-CM | POA: Insufficient documentation

## 2021-07-16 DIAGNOSIS — I10 Essential (primary) hypertension: Secondary | ICD-10-CM | POA: Diagnosis not present

## 2021-07-16 DIAGNOSIS — I5042 Chronic combined systolic (congestive) and diastolic (congestive) heart failure: Secondary | ICD-10-CM | POA: Diagnosis not present

## 2021-07-16 DIAGNOSIS — Z79899 Other long term (current) drug therapy: Secondary | ICD-10-CM | POA: Diagnosis not present

## 2021-07-16 DIAGNOSIS — I509 Heart failure, unspecified: Secondary | ICD-10-CM | POA: Diagnosis present

## 2021-07-16 DIAGNOSIS — I428 Other cardiomyopathies: Secondary | ICD-10-CM | POA: Diagnosis not present

## 2021-07-16 DIAGNOSIS — I130 Hypertensive heart and chronic kidney disease with heart failure and stage 1 through stage 4 chronic kidney disease, or unspecified chronic kidney disease: Secondary | ICD-10-CM | POA: Insufficient documentation

## 2021-07-16 DIAGNOSIS — D8685 Sarcoid myocarditis: Secondary | ICD-10-CM | POA: Diagnosis not present

## 2021-07-16 DIAGNOSIS — I110 Hypertensive heart disease with heart failure: Secondary | ICD-10-CM

## 2021-07-16 DIAGNOSIS — N1831 Chronic kidney disease, stage 3a: Secondary | ICD-10-CM

## 2021-07-16 DIAGNOSIS — I4892 Unspecified atrial flutter: Secondary | ICD-10-CM | POA: Diagnosis not present

## 2021-07-16 DIAGNOSIS — I2510 Atherosclerotic heart disease of native coronary artery without angina pectoris: Secondary | ICD-10-CM | POA: Insufficient documentation

## 2021-07-16 DIAGNOSIS — I4891 Unspecified atrial fibrillation: Secondary | ICD-10-CM | POA: Insufficient documentation

## 2021-07-16 DIAGNOSIS — I43 Cardiomyopathy in diseases classified elsewhere: Secondary | ICD-10-CM | POA: Diagnosis not present

## 2021-07-16 DIAGNOSIS — Z9889 Other specified postprocedural states: Secondary | ICD-10-CM

## 2021-07-16 DIAGNOSIS — D869 Sarcoidosis, unspecified: Secondary | ICD-10-CM | POA: Diagnosis not present

## 2021-07-16 NOTE — Patient Instructions (Signed)
Your physician recommends that you schedule a follow-up appointment in: 2 months  At the [facility], you and your health needs are our priority. As part of our continuing mission to provide you with exceptional heart care, we have created designated Provider Care Teams. These Care Teams include your primary Cardiologist (physician) and Advanced Practice Providers (APPs- Physician Assistants and Nurse Practitioners) who all work together to provide you with the care you need, when you need it.   You may see any of the following providers on your designated Care Team at your next follow up: Dr Glori Bickers Dr Haynes Kerns, NP Lyda Jester, [facility] [place], [place] Audry Riles, PharmD   Please be sure to bring in all your medications bottles to every appointment.   If you have any questions or concerns before your next appointment please send [place] a message through [place] or call our office at [phone].    TO LEAVE A MESSAGE FOR THE NURSE SELECT OPTION 2, PLEASE LEAVE A MESSAGE INCLUDING: YOUR NAME DATE OF BIRTH CALL BACK NUMBER REASON FOR CALL**this is important as we prioritize the call backs  YOU WILL RECEIVE A CALL BACK THE SAME DAY AS LONG AS YOU CALL BEFORE 4:00 PM

## 2021-07-16 NOTE — Progress Notes (Signed)
PCP: [name], MD Cardiology: Dr. [name] HF Cardiology: Dr. [name]  60 y.o. 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 [name] for evaluation of CHF. 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  Cardiolite in 5/16 showed no ischemia but possible apical infarction.  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  PYP scan was read as equivocal in 5/20 but probably not suggestive of transthyretin amyloidosis.   TEE was done in 6/20 to assess severity of MR.  EF was 50% with mild mitral valve prolapse and moderate MR.  RHC/LHC showed nonobstructive CAD, pulmonary venous hypertension, relatively controlled filling pressures. 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   Echo in 3/21 showed EF 55-60%, normal RV, severe LAE, moderate-severe MR.    Patient's BP has been poorly controlled over time.  He has not been consistent with medication compliance.  Renal artery dopplers were unremarkable in 6/16.   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   He had atrial flutter in 9/21 but has been back in NSR.   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  LHC/RHC in 2/22 showed 50% mLAD stenosis, 80% dLAD, prominent v-waves on PCWP tracing. High resolution CT chest in 3/22 showed numerous mediastinal and axillary lymph nodes,  consider sarcoidosis versus lymphoproliferative disease.  Cardiac PET in 4/22 at [facility] showed EF 42%, areas of active inflammation in LV myocardial concerning for cardiac sarcoidosis.  Repeat echo in 5/22 showed EF 50-55%, moderate LV enlargement, low normal RV function, moderate RV enlargement, moderate-severe MR with dilated IVC.   Patient was seen at the sarcoid clinic at [facility] by Dr. Weyman Croon.  PET was reviewed, he was suspected to have "burned out" sarcoid with minimal activity.  Bronchoscopy with biopsy in 7/22 was negative for sarcoidosis.  He was evaluated by cardiac surgery at [place], and had MV repair/TV repair/Maze in 8/22. Echo post-op in 8/22 showed EF > 55%, moderate LVH, normal RV, s/p MV repair with mean gradient 3 and no MR, s/p TV repair with mean gradient 1 and mild TR, PASP 57 mmHg.   Saw Dr [name] 05/31/21 and he stopped amlodipine, hydralazine cut back, and lasix cut back.    Today he returns for HF follow up.Overall feeling fine. Feeling better with blood pressure medication cut back. Denies SOB/PND/Orthopnea. Appetite ok. No fever or chills. Weight at home stable.  Taking all medications.    Labs (5/20): K 3.5, creatinine 1.23 Labs (6/20): Urine immunofixation negative, K 4.2, creatinine 1.36 Labs (7/20): creatinine 1.43 Labs (9/20): uric acid 7.5, LDL 67 Labs (1/21): K 3.9, creatinine 1.33, BNP 388, ACE level low Labs (5/21): LDL 57 Labs (9/21): BNP 804, K 4.1, creatinine 1.42 Labs (12/21): K 4.8, creatinine 1.3 Labs (2/22): K 4.7, creatinine 1.07, ACE level 46 (normal) Labs (4/22): K 4.4, creatinine 1.32 Labs (5/22): LDL 48 Labs (6/22): K 3.9, creatinine 1.22 Labs (8/22): K 4.5, creatinine 1.77  Labs ( 05/31/21): K 4.5 Creatinine 1.7   PMH: 1. Gout 2. H/o NSVT/PVCs: Zio patch 1/22 with rare PVCs, few short NSVT runs.  3. 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  Seen on multiple echoes, first in 2015.  4. HTN: Renal artery dopplers in 6/16 were unremarkable. Sleep  study about 2 years ago was negative per patient's report.  5. Mitral regurgitation: TEE in 2015 with mild to moderate MR.  - Echo (2/20): Moderate MR.  - Echo (5/20): Possible severe MR - TEE (6/20): Mild mitral valve prolapse with moderate MR.  - Echo (3/21): Moderate to severe MR.  - TEE (9/21): moderate-severe MR with prolapse of A2 (eccentric MR).  - Cardiac MRI (1/22) with severe MR (regurgitant fraction 54%, regurgitant volume 83 cc).  - Echo (5/22): moderate-severe MR with prolapse A2 - S/p MV repair at [facility] in 8/22.  Post-op echo in 8/22 with MV repair, mean gradient 3 mmHg, no MR.  6. 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  - Cardiolite (5/16): Possible apical infarct, no ischemia.  - Echo (7/19): EF 50-55%.  - Echo (2/20): EF 50-55%, moderate MR, TV fibroelastoma.  - PYP scan (5/20): Grade 1 visually, H/CL 1.23.  Read as equivocal but likely negative for TTR amyloidosis.  - Echo (5/20): EF 40-45%, mildly dilated LV with mild LVH, normal RV size and systolic function, possible severe MR, moderate TR, 1 cm nodule on TV may be fibroelastoma, PASP 79 mmHg.  - TEE (6/20): EF 50%, mild LV dilation, mild LVH, moderate LAE, tricuspid valve mass likely fibroelastoma, mild MVP with moderate MR.  - LHC/RHC (6/20): 50% LAD, 30% pRCA; mean RA 4, PA 55/21 mean 34, mean PCWP 17, CI 2.66, PVR 2.8 WU.  - Cardiac MRI (8/20): EF 53%, mild LVH, inferoseptal/inferior mid-wall LGE (?cardiac sarcoidosis, does not look like amyloidosis), mild RV dilation with RVEF 54%, moderate eccentric MR, severe LAE. - Echo (3/21):  EF 55-60%, normal RV, severe LAE, moderate-severe MR. 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  - Cardiac MRI (1/22): moderate LV dilation with EF 45%, diffuse hypokinesis worse in the basal-mid inferior and  inferoseptal walls, moderate RV dilation with EF 54%, severe MR with regurgitant fraction 54% and regurgitant volume 83 cc, mid-wall LGE in the basal inferoseptal and inferior walls concerning for possible cardiac sarcoidosis.  - RHC/LHC (2/22): 50% mid LAD, 80% dLAD; mean RA 6, PA 61/22 mean 39, mean PCWP 26 with v waves to 45, CI 2.69, PVR 2.13 WU.  - Cardiac PET: 4/22 at [facility] showed EF 42%, areas of active inflammation in LV myocardial concerning for cardiac sarcoidosis. - Bronchoscopy with biopsy in 7/22 did not show sarcoidosis.  - Echo (5/22): EF 50-55%, moderate LV enlargement, low normal RV function, moderate RV enlargement, moderate-severe MR with dilated IVC.  - Echo (8/22, post-MV/TV repair): EF > 55%, moderate LVH, normal RV, s/p MV repair with mean gradient 3 and no MR, s/p TV repair with mean gradient 1 and mild TR, PASP 57 mmHg. 7. Atrial flutter/fibrillation: Noted first in 9/21.  - S/p Maze in 8/22 with MV/TV surgery.  8. CAD: LHC (2/22) with 50% mid LAD, 80% distal LAD stenoses.  Medical management.  9. CT chest (3/22): Numerous mediastinal and axillary lymph nodes, some of which are enlarged. Findings can be seen with sarcoid or a lymphoproliferative  disorder. 10. Tricuspid regurgitation: TV repair in 8/22 with MV repair, mean gradient 1 mmHg with mild TR.  11. CKD stage 3  FH:  Father with MI at 41, brother with MI at 48, mother with CHF, MI.  HTN in multiple family members.   Social History   Socioeconomic History   Marital status: Divorced    Spouse name: Not on file   Number of children: Not on file   Years of education: Not on file   Highest education level: Not on file  Occupational History   Not on file  Tobacco Use   Smoking status: Never   Smokeless tobacco: Never  Vaping Use   Vaping Use: Never used  Substance and Sexual Activity   Alcohol use: Not Currently   Drug use: Never   Sexual activity: Yes  Other Topics Concern   Not on file  Social History  Narrative   Not on file   Social Determinants of Health   Financial Resource Strain: Not on file  Food Insecurity: Not on file  Transportation Needs: Not on file  Physical Activity: Not on file  Stress: Not on file  Social Connections: Not on file  Intimate Partner Violence: Not on file   ROS: All systems reviewed and negative except as per HPI.   Current Outpatient Medications  Medication Sig Dispense Refill   acetaminophen (TYLENOL) 650 MG CR tablet Take 1,300 mg by mouth every 8 (eight) hours as needed for pain.     allopurinol (ZYLOPRIM) 100 MG tablet Take 1 tablet (100 mg total) by mouth 2 (two) times daily. 60 tablet 6   amiodarone (PACERONE) 200 MG tablet Take 200 mg by mouth daily.     bisacodyl (DULCOLAX) 5 MG EC tablet Take 1 tablet (5 mg total) by mouth daily as needed for moderate constipation. 4 tablet 0   carvedilol (COREG) 25 MG tablet Take 1.5 tablets (37.5 mg total) by mouth 2 (two) times daily. 270 tablet 3   ELIQUIS 5 MG TABS tablet TAKE 1 TABLET (5 MG TOTAL) BY MOUTH 2 (TWO) TIMES DAILY (AM+EVENING) 60 tablet 3   furosemide (LASIX) 40 MG tablet Take 1 tablet (40 mg total) by mouth 2 (two) times daily. 60 tablet 6   hydrALAZINE (APRESOLINE) 50 MG tablet Take 1 tablet (50 mg total) by mouth 3 (three) times daily. 270 tablet 3   isosorbide mononitrate (IMDUR) 60 MG 24 hr tablet Take 1.5 tablets (90 mg total) by mouth daily. 135 tablet 3   losartan (COZAAR) 100 MG tablet Take 1 tablet (100 mg total) by mouth in the morning AND 0.5 tablets (50 mg total) every evening. 135 tablet 3   Multiple Vitamin (MULTIVITAMIN WITH MINERALS) TABS tablet Take 1 tablet by mouth daily. 30 tablet 0   potassium chloride (MICRO-K) 10 MEQ CR capsule Take 10 mEq by mouth 2 (two) times daily.     rosuvastatin (CRESTOR) 10 MG tablet TAKE 1 TABLET (10 MG TOTAL) BY MOUTH DAILY (BEDTIME) 90 tablet 3   spironolactone (ALDACTONE) 50 MG tablet Take 1 tablet (50 mg total) by mouth daily. 90 tablet 3    No current facility-administered medications for this encounter.   BP (!) 142/76   Pulse 73   Wt 106 kg (233 lb 9.6 oz)   SpO2 99%   BMI 30.82 kg/m  Wt Readings from Last 3 Encounters:  07/16/21 106 kg (233 lb 9.6 oz)  05/31/21 101.2 kg (223 lb 3.2 oz)  05/09/21 102.1 kg (225  lb)   General:  Well appearing. No resp difficulty. Walked in the clinic.  HEENT: normal Neck: supple. no JVD. Carotids 2+ bilat; no bruits. No lymphadenopathy or thryomegaly appreciated. Cor: PMI nondisplaced. Regular rate & rhythm. No rubs, gallops or murmurs. Lungs: clear Abdomen: soft, nontender, nondistended. No hepatosplenomegaly. No bruits or masses. Good bowel sounds. Extremities: no cyanosis, clubbing, rash, edema Neuro: alert & orientedx3, cranial nerves grossly intact. moves all 4 extremities w/o difficulty. Affect pleasant  Assessment/Plan: 1. HTN: Negative renal artery dopplers and sleep study in past.  On multiple agents.   - BP better today.  - Unable to tolerate Entresto due to cough, continue losartan 100 qam/50 qpm.     - Continue hydralazine to 50 mg tid.  - Continue spironolactone 50 mg daily.  - Continue Coreg 37.5 mg bid.  2. 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  Echo in 5/20 showed EF 40-45% with normal RV, possible severe mitral regurgitation, and severe pulmonary hypertension.  PYP scan in 5/20 was read as equivocal, but I suspect this is not suggestive of transthyretin amyloidosis. TEE in 5/20 showed EF 50%, mild LV dilation, moderate MR.  RHC/LHC showed mildly elevated PCWP, pulmonary venous hypertension, and nonobstructive CAD.  Cardiac MRI in 8/20 showed EF 53% and had an LGE pattern that was suggestive of possible cardiac sarcoidosis (not amyloidosis).  Echo in 3/21 showed EF up to 55-60%.  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  Cardiac MRI in 1/22 with moderate LV dilation with EF 45%, diffuse hypokinesis worse in the basal-mid inferior and inferoseptal walls, moderate RV dilation with EF 54%, severe MR with regurgitant fraction 54% and regurgitant volume 83 cc, mid-wall LGE in the basal inferoseptal and inferior walls concerning for possible cardiac sarcoidosis. LHC/RHC showed moderate CAD without interventional target, elevated PCWP with prominent v-waves.  Cardiac PET in 4/22 concerning for cardiac amyloidosis.  Bronchoscopy with biopsy in 7/22 was negative for sarcoidosis.  Most recent echo was post-MV/TV, showing EF > 55%, moderate LVH, normal RV, s/p MV repair with mean gradient 3 and no MR, s/p TV repair with mean gradient 1 and mild TR, PASP 57 mmHg.   - NYHA II. Volume status stable. Continue lasix twice a day.  - Continue losartan 100 qam/50 qpm (does not tolerate Entresto).   - Continue Coreg 37.5 mg bid as above.  - Continue spironolactone 50 mg daily.  - Continue hydralazine to 50 mg tid and continue Imdur 90 mg daily as above.  - Strong concern for cardiac sarcoidosis based on 4/22 cPET at [facility] and cMRI done here.  High resolution CT chest from 3/22 shows enlarged mediastinal and axillary lymph nodes (could be seen in sarcoidosis versus lymphoproliferative disease).  However, bronchoscopy with biopsy was negative for sarcoidosis in 7/22.  Patient will need repeat cardiac PET in 10/22 to see if inflammation consistent with sarcoidosis has changed.  If worsened, may need immunosuppression.  If not, will continue current management without immunosuppression.  - He has PET scan next month.  3. Mitral regurgitation: ?severe on 5/20 TTE but TEE in 6/20 showed mild mitral valve prolapse with only moderate MR.  Cardiac MRI in 8/20 also suggested moderate MR. TTE in 3/21 suggested moderate-severe MR. TEE in 9/21 with moderate-severe MR, A2 prolapse.  Repeat cardiac MRI in 1/22 clearly showed severe MR  with regurgitant  fraction 54% and regurgitant volume 83 cc.  In 8/22, patient had MV and TV repairs with Maze.  Post-op echo showed stable repaired MV in 8/22.  - Will need endocarditis prophylaxis with dental work.  -Cardiac rehab starting next week.  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  5. CAD: Moderate CAD without interventional target on 2/22 cath, no chest pain.  - continue Crestor, good lipids in 5/22.  - He is on apixaban so no ASA.  6. Atrial flutter/fibrillation: S/p Maze with MV/TV repairs.   - Continue Eliquis 5 mg bid.  - He is on amiodarone 200 mg daily.  If he remains in NSR in the future, should be able to stop this.  - LFTS/TSH stable 05/2021   - Had eye exam 06/2021.  7. Cardiac sarcoidosis: As above, strong suspicion for cardiac sarcoidosis based on cPET and cMRI.  He has concerning chest lymphadenopathy as well.  We do not yet have a tissue diagnosis.  Bronchoscopy with biopsy in 7/22 was negative for sarcoidosis.  He was seen by Dr. Weyman Croon at the [facility] cardiac sarcoidosis clinic.  The PET was reviewed, and it was thought that sarcoid is not particularly active (possible "burned out" sarcoidosis).   - Plan for repeat PET in 07/23/21.  If inflammation has increased, would plan immunosuppression.  If inflammation is stable/decreased, no treatment.  8. CKD Stage IIIa Creatinine baseline 1.5-1.7    Follow up with Dr [name] in 2 months. Daniel Parke NP-C  07/16/2021

## 2021-07-17 ENCOUNTER — Telehealth (HOSPITAL_COMMUNITY): Payer: Self-pay | Admitting: *Deleted

## 2021-07-18 ENCOUNTER — Telehealth (HOSPITAL_COMMUNITY): Payer: Self-pay | Admitting: *Deleted

## 2021-07-18 NOTE — Telephone Encounter (Signed)
Left message to confirm appointment for cardiac rehab orientation.Barnet Pall, RN,BSN 07/18/2021 2:59 PM

## 2021-07-19 ENCOUNTER — Encounter (HOSPITAL_COMMUNITY)
Admission: RE | Admit: 2021-07-19 | Discharge: 2021-07-19 | Disposition: A | Discharge status: Home / Self Care | Payer: Medicaid Other | Source: Ambulatory Visit | Attending: Cardiology | Admitting: Cardiology

## 2021-07-19 ENCOUNTER — Encounter (HOSPITAL_COMMUNITY): Payer: Self-pay

## 2021-07-19 ENCOUNTER — Other Ambulatory Visit: Payer: Self-pay

## 2021-07-19 VITALS — BP 122/78 | HR 69 | Ht 70.5 in | Wt 232.1 lb

## 2021-07-19 DIAGNOSIS — Z9889 Other specified postprocedural states: Secondary | ICD-10-CM | POA: Insufficient documentation

## 2021-07-19 NOTE — Progress Notes (Signed)
Cardiac Individual Treatment Plan  Patient Details  Name: Daniel Joseph MRN: [id number] Date of Birth: [birth year] Referring Provider:   Flowsheet Row CARDIAC REHAB PHASE II ORIENTATION from 07/19/2021 in [facility]  Referring Provider Loralie Champagne, MD       Initial Encounter Date:  [name] PHASE II ORIENTATION from 07/19/2021 in [place]  Date 07/19/21       Visit Diagnosis: 04/23/21 S/P mitral valve repair at [facility]  04/23/21 S/P tricuspid valve repair at [facility]  04/23/21 S/P Maze procedure removal of tricuspid mass.  Patient's Home Medications on Admission:  Current Outpatient Medications:    acetaminophen (TYLENOL) 650 MG CR tablet, Take 1,300 mg by mouth every 8 (eight) hours as needed for pain., Disp: , Rfl:    amLODipine (NORVASC) 10 MG tablet, Take 10 mg by mouth daily with lunch., Disp: , Rfl:    bisacodyl (DULCOLAX) 5 MG EC tablet, Take 1 tablet (5 mg total) by mouth daily as needed for moderate constipation., Disp: 4 tablet, Rfl: 0   carvedilol (COREG) 25 MG tablet, Take 1.5 tablets (37.5 mg total) by mouth 2 (two) times daily., Disp: 270 tablet, Rfl: 3   ELIQUIS 5 MG TABS tablet, TAKE 1 TABLET (5 MG TOTAL) BY MOUTH 2 (TWO) TIMES DAILY (AM+EVENING), Disp: 60 tablet, Rfl: 3   furosemide (LASIX) 40 MG tablet, Take 1 tablet (40 mg total) by mouth 2 (two) times daily., Disp: 60 tablet, Rfl: 6   hydrALAZINE (APRESOLINE) 50 MG tablet, Take 1 tablet (50 mg total) by mouth 3 (three) times daily., Disp: 270 tablet, Rfl: 3   isosorbide mononitrate (IMDUR) 60 MG 24 hr tablet, Take 1.5 tablets (90 mg total) by mouth daily., Disp: 135 tablet, Rfl: 3   losartan (COZAAR) 100 MG tablet, Take 1 tablet (100 mg total) by mouth in the morning AND 0.5 tablets (50 mg total) every evening., Disp: 135 tablet, Rfl: 3   Multiple Vitamin (MULTIVITAMIN WITH MINERALS) TABS tablet, Take 1 tablet by mouth daily., Disp: 30  tablet, Rfl: 0   rosuvastatin (CRESTOR) 10 MG tablet, TAKE 1 TABLET (10 MG TOTAL) BY MOUTH DAILY (BEDTIME), Disp: 90 tablet, Rfl: 3   spironolactone (ALDACTONE) 50 MG tablet, Take 1 tablet (50 mg total) by mouth daily., Disp: 90 tablet, Rfl: 3  Past Medical History: Past Medical History:  Diagnosis Date   Arthritis    back   Benign essential HTN 01/19/2014   Renal Artery [place] 6/16:  No RAS, No AAA   Cardiomyopathy, dilated, nonischemic (HCC)    EF 45-50% bye echo 08/2015 and EF 50-55% by echo 10/2017   Chronic combined systolic and diastolic CHF (congestive heart failure) ([place]) 01/21/2014   Echocardiogram 5.2020    Echo 01/2019: EF 40-45, mild LVH, Gr 3 DD (restrictive filling), normal RVSF, MR may be severe, mod TR, 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, PASP 79   Gout    H/O atrial flutter 05/2020   but back to NSR   Mitral valve regurgitation    mild to moderate MR with MVP of ANMVL by echo 2019   PVC's (premature ventricular contractions)    nonsustained VT as well as PVCs - no ICD indicated due to EF 40-45%   Sleep apnea    pending second sleep study    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    Tobacco Use: Social History   Tobacco Use  Smoking  Status Never  Smokeless Tobacco Never    Labs: Recent Review Flowsheet Data     Labs for ITP Cardiac and Pulmonary Rehab Latest Ref Rng & Units 06/08/2020 06/08/2020 11/09/2020 11/09/2020 02/09/2021   Cholestrol 0 - 200 mg/dL - - - - 99   LDLCALC 0 - 99 mg/dL - - - - 48   HDL >40 mg/dL - - - - 41   Trlycerides <150 mg/dL - - - - 51   Hemoglobin A1c 4.8 - 5.6 % - - - - -   HCO3 20.0 - 28.0 mmol/L - - 22.9 21.9 -   TCO2 22 - 32 mmol/L 25 21(L) 24 23 -   ACIDBASEDEF 0.0 - 2.0 mmol/L - - 3.0(H) 4.0(H) -   O2SAT % - - 74.0 70.0 -       Capillary Blood Glucose: No results found for: GLUCAP   Exercise Target Goals: Exercise Program Goal: Individual exercise prescription set using results from  initial 6 min walk test and THRR while considering  patient's activity barriers and safety.   Exercise Prescription Goal: Starting with aerobic activity 30 plus minutes a day, 3 days per week for initial exercise prescription. Provide home exercise prescription and guidelines that participant acknowledges understanding prior to discharge.  Activity Barriers & Risk Stratification:  Activity Barriers & Cardiac Risk Stratification - 07/19/21 1044       Activity Barriers & Cardiac Risk Stratification   Activity Barriers Back Problems;Shortness of Breath;Balance Concerns    Cardiac Risk Stratification High             6 Minute Walk:  6 Minute Walk     Row Name 07/19/21 1042         6 Minute Walk   Phase Initial     Distance 1541 feet     Walk Time 6 minutes     # of Rest Breaks 0     MPH 2.92     METS 3.49     RPE 7     Perceived Dyspnea  0     VO2 Peak 12.23     Symptoms No     Resting HR 69 bpm     Resting BP 122/78     Resting Oxygen Saturation  98 %     Exercise Oxygen Saturation  during 6 min walk 99 %     Max Ex. HR 1.64 bpm     Max Ex. BP 128/78     2 Minute Post BP 114/66              Oxygen Initial Assessment:   Oxygen Re-Evaluation:   Oxygen Discharge (Final Oxygen Re-Evaluation):   Initial Exercise Prescription:  Initial Exercise Prescription - 07/19/21 1000       Date of Initial Exercise RX and Referring Provider   Date 07/19/21    Referring Provider Loralie Champagne, MD    Expected Discharge Date 09/14/21      Treadmill   MPH 2.5    Grade 1    Minutes 15    METs 3.26      Arm Ergometer   Level 2    RPM 60    Minutes 15    METs 2.2      Prescription Details   Frequency (times per week) 3    Duration Progress to 30 minutes of continuous aerobic without signs/symptoms of physical distress      Intensity   THRR 40-80% of Max Heartrate 65-131  Ratings of Perceived Exertion 11-13    Perceived Dyspnea 0-4      Progression    Progression Continue progressive overload as per policy without signs/symptoms or physical distress.      Resistance Training   Training Prescription Yes    Weight 4 lbs    Reps 10-15             Perform Capillary Blood Glucose checks as needed.  Exercise Prescription Changes:   Exercise Comments:   Exercise Goals and Review:   Exercise Goals     Row Name 07/19/21 1045             Exercise Goals   Increase Physical Activity Yes       Intervention Provide advice, education, support and counseling about physical activity/exercise needs.;Develop an individualized exercise prescription for aerobic and resistive training based on initial evaluation findings, risk stratification, comorbidities and participant's personal goals.       Expected Outcomes Short Term: Attend rehab on a regular basis to increase amount of physical activity.;Long Term: Add in home exercise to make exercise part of routine and to increase amount of physical activity.;Long Term: Exercising regularly at least 3-5 days a week.       Increase Strength and Stamina Yes       Intervention Provide advice, education, support and counseling about physical activity/exercise needs.;Develop an individualized exercise prescription for aerobic and resistive training based on initial evaluation findings, risk stratification, comorbidities and participant's personal goals.       Expected Outcomes Short Term: Increase workloads from initial exercise prescription for resistance, speed, and METs.;Short Term: Perform resistance training exercises routinely during rehab and add in resistance training at home;Long Term: Improve cardiorespiratory fitness, muscular endurance and strength as measured by increased METs and functional capacity (6MWT)       Able to understand and use rate of perceived exertion (RPE) scale Yes       Intervention Provide education and explanation on how to use RPE scale       Expected Outcomes Short Term:  Able to use RPE daily in rehab to express subjective intensity level;Long Term:  Able to use RPE to guide intensity level when exercising independently       Knowledge and understanding of Target Heart Rate Range (THRR) Yes       Intervention Provide education and explanation of THRR including how the numbers were predicted and where they are located for reference       Expected Outcomes Short Term: Able to state/look up THRR;Long Term: Able to use THRR to govern intensity when exercising independently;Short Term: Able to use daily as guideline for intensity in rehab       Understanding of Exercise Prescription Yes       Intervention Provide education, explanation, and written materials on patient's individual exercise prescription       Expected Outcomes Short Term: Able to explain program exercise prescription;Long Term: Able to explain home exercise prescription to exercise independently                Exercise Goals Re-Evaluation :    Discharge Exercise Prescription (Final Exercise Prescription Changes):   Nutrition:  Target Goals: Understanding of nutrition guidelines, daily intake of sodium 1500mg , cholesterol 200mg , calories 30% from fat and 7% or less from saturated fats, daily to have 5 or more servings of fruits and vegetables.  Biometrics:  Pre Biometrics - 07/19/21 0845       Pre Biometrics  Waist Circumference 44 inches    Hip Circumference 46 inches    Waist to Hip Ratio 0.96 %    Triceps Skinfold 8 mm    % Body Fat 28.1 %    Grip Strength 48 kg    Flexibility 0 in   unable to reach box   Single Leg Stand 6.31 seconds              Nutrition Therapy Plan and Nutrition Goals:   Nutrition Assessments:  MEDIFICTS Score Key: ?70 Need to make dietary changes  40-70 Heart Healthy Diet ? 40 Therapeutic Level Cholesterol Diet   Picture Your Plate Scores: <62 Unhealthy dietary pattern with much room for improvement. 41-50 Dietary pattern unlikely to  meet recommendations for good health and room for improvement. 51-60 More healthful dietary pattern, with some room for improvement.  >60 Healthy dietary pattern, although there may be some specific behaviors that could be improved.    Nutrition Goals Re-Evaluation:   Nutrition Goals Discharge (Final Nutrition Goals Re-Evaluation):   Psychosocial: Target Goals: Acknowledge presence or absence of significant depression and/or stress, maximize coping skills, provide positive support system. Participant is able to verbalize types and ability to use techniques and skills needed for reducing stress and depression.  Initial Review & Psychosocial Screening:  Initial Psych Review & Screening - 07/19/21 1350       Initial Review   Current issues with None Identified      Family Dynamics   Good Support System? Yes   Daelyn lives alone. Kasean has his daughter for support     Barriers   Psychosocial barriers to participate in program There are no identifiable barriers or psychosocial needs.      Screening Interventions   Interventions Encouraged to exercise             Quality of Life Scores:  Quality of Life - 07/19/21 1036       Quality of Life   Select Quality of Life      Quality of Life Scores   Health/Function Pre 25.6 %    Socioeconomic Pre 29.25 %    Psych/Spiritual Pre 30 %    Family Pre 26.4 %    GLOBAL Pre 27.43 %            Scores of 19 and below usually indicate a poorer quality of life in these areas.  A difference of  2-3 points is a clinically meaningful difference.  A difference of 2-3 points in the total score of the Quality of Life Index has been associated with significant improvement in overall quality of life, self-image, physical symptoms, and general health in studies assessing change in quality of life.  PHQ-9: Recent Review Flowsheet Data     Depression screen [facility] 2/9 07/19/2021 02/01/2020 09/02/2018 05/21/2018 03/18/2018   Decreased Interest 0 0  0 0 0   Down, Depressed, Hopeless 0 0 0 0 1   PHQ - 2 Score 0 0 0 0 1      Interpretation of Total Score  Total Score Depression Severity:  1-4 = Minimal depression, 5-9 = Mild depression, 10-14 = Moderate depression, 15-19 = Moderately severe depression, 20-27 = Severe depression   Psychosocial Evaluation and Intervention:   Psychosocial Re-Evaluation:   Psychosocial Discharge (Final Psychosocial Re-Evaluation):   Vocational Rehabilitation: Provide vocational rehab assistance to qualifying candidates.   Vocational Rehab Evaluation & Intervention:  Vocational Rehab - 07/19/21 1352       Initial Vocational Rehab  Evaluation & Intervention   Assessment shows need for Vocational Rehabilitation No   Dontrey is disabled and does not need vocational rehab at this time.            Education: Education Goals: Education classes will be provided on a weekly basis, covering required topics. Participant will state understanding/return demonstration of topics presented.  Learning Barriers/Preferences:  Learning Barriers/Preferences - 07/19/21 1041       Learning Barriers/Preferences   Learning Barriers None    Learning Preferences Skilled Demonstration;Individual Instruction;Group Instruction             Education Topics: Hypertension, Hypertension Reduction -Define heart disease and high blood pressure. Discus how high blood pressure affects the body and ways to reduce high blood pressure.   Exercise and Your Heart -Discuss why it is important to exercise, the FITT principles of exercise, normal and abnormal responses to exercise, and how to exercise safely.   Angina -Discuss definition of angina, causes of angina, treatment of angina, and how to decrease risk of having angina.   Cardiac Medications -Review what the following cardiac medications are used for, how they affect the body, and side effects that may occur when taking the medications.  Medications include  Aspirin, Beta blockers, calcium channel blockers, ACE Inhibitors, angiotensin receptor blockers, diuretics, digoxin, and antihyperlipidemics.   Congestive Heart Failure -Discuss the definition of CHF, how to live with CHF, the signs and symptoms of CHF, and how keep track of weight and sodium intake.   Heart Disease and Intimacy -Discus the effect sexual activity has on the heart, how changes occur during intimacy as we age, and safety during sexual activity.   Smoking Cessation / COPD -Discuss different methods to quit smoking, the health benefits of quitting smoking, and the definition of COPD.   Nutrition I: Fats -Discuss the types of cholesterol, what cholesterol does to the heart, and how cholesterol levels can be controlled.   Nutrition II: Labels -Discuss the different components of food labels and how to read food label   Heart Parts/Heart Disease and PAD -Discuss the anatomy of the heart, the pathway of blood circulation through the heart, and these are affected by heart disease.   Stress I: Signs and Symptoms -Discuss the causes of stress, how stress may lead to anxiety and depression, and ways to limit stress.   Stress II: Relaxation -Discuss different types of relaxation techniques to limit stress.   Warning Signs of Stroke / TIA -Discuss definition of a stroke, what the signs and symptoms are of a stroke, and how to identify when someone is having stroke.   Knowledge Questionnaire Score:  Knowledge Questionnaire Score - 07/19/21 1039       Knowledge Questionnaire Score   Pre Score 12/24             Core Components/Risk Factors/Patient Goals at Admission:  Personal Goals and Risk Factors at Admission - 07/19/21 1039       Core Components/Risk Factors/Patient Goals on Admission    Weight Management Yes;Weight Maintenance    Intervention Weight Management: Develop a combined nutrition and exercise program designed to reach desired caloric intake,  while maintaining appropriate intake of nutrient and fiber, sodium and fats, and appropriate energy expenditure required for the weight goal.;Weight Management: Provide education and appropriate resources to help participant work on and attain dietary goals.    Expected Outcomes Weight Maintenance: Understanding of the daily nutrition guidelines, which includes 25-35% calories from fat, 7% or less cal from  saturated fats, less than 200mg  cholesterol, less than 1.5gm of sodium, & 5 or more servings of fruits and vegetables daily;Long Term: Adherence to nutrition and physical activity/exercise program aimed toward attainment of established weight goal;Short Term: Continue to assess and modify interventions until short term weight is achieved;Understanding recommendations for meals to include 15-35% energy as protein, 25-35% energy from fat, 35-60% energy from carbohydrates, less than 200mg  of dietary cholesterol, 20-35 gm of total fiber daily;Understanding of distribution of calorie intake throughout the day with the consumption of 4-5 meals/snacks    Hypertension Yes    Intervention Provide education on lifestyle modifcations including regular physical activity/exercise, weight management, moderate sodium restriction and increased consumption of fresh fruit, vegetables, and low fat dairy, alcohol moderation, and smoking cessation.;Monitor prescription use compliance.    Expected Outcomes Short Term: Continued assessment and intervention until BP is < 140/79mm HG in hypertensive participants. < 130/45mm HG in hypertensive participants with diabetes, heart failure or chronic kidney disease.;Long Term: Maintenance of blood pressure at goal levels.             Core Components/Risk Factors/Patient Goals Review:    Core Components/Risk Factors/Patient Goals at Discharge (Final Review):    ITP Comments:  ITP Comments     Row Name 07/19/21 0939           ITP Comments Dr Fransico Him MD, Medical  Director                Comments: Edna attended orientation on 07/19/2021 to review rules and guidelines for program.  Completed 6 minute walk test, Intitial ITP, and exercise prescription.  VSS. Telemetry-Sinus Rhythm.  Asymptomatic. Safety measures and social distancing in place per CDC guidelines. Barnet Pall, RN,BSN 07/19/2021 1:58 PM

## 2021-07-19 NOTE — Progress Notes (Signed)
Cardiac Rehab Medication Review by a Pharmacist  Does the patient  feel that his/her medications are working for him/her?  YES   Has the patient been experiencing any side effects to the medications prescribed?  NO  Does the patient measure his/her own blood pressure or blood glucose at home?  YES   Does the patient have any problems obtaining medications due to transportation or finances?    NO  Understanding of regimen: good Understanding of indications: good Potential of compliance: good    Nurse  comments: Daniel Joseph is taking his medications as prescribed and has a good understanding of what his medications are for. Derrell checks his blood pressures at home.    Christa See [facility] RN 07/19/2021 9:40 AM

## 2021-07-23 ENCOUNTER — Encounter (HOSPITAL_COMMUNITY): Payer: Medicaid Other

## 2021-07-23 ENCOUNTER — Telehealth (HOSPITAL_COMMUNITY): Payer: Self-pay | Admitting: *Deleted

## 2021-07-23 NOTE — Telephone Encounter (Signed)
Patient left message on department voicemail. Will be absent from cardiac rehab today, not feeling well.

## 2021-07-25 ENCOUNTER — Encounter (HOSPITAL_COMMUNITY)
Admission: RE | Admit: 2021-07-25 | Discharge: 2021-07-25 | Disposition: A | Discharge status: Home / Self Care | Payer: Medicaid Other | Source: Ambulatory Visit | Attending: Cardiology | Admitting: Cardiology

## 2021-07-25 ENCOUNTER — Telehealth: Payer: Self-pay | Admitting: Physician Assistant

## 2021-07-25 ENCOUNTER — Other Ambulatory Visit: Payer: Self-pay

## 2021-07-25 DIAGNOSIS — Z9889 Other specified postprocedural states: Secondary | ICD-10-CM

## 2021-07-25 NOTE — Progress Notes (Signed)
Pt started cardiac rehab today.  Pt tolerated light exercise without difficulty. VSS, telemetry-SR with frequent PVCs, pairs, and a brief run of a 5 beat wide complex tachycardia after his warm-up period on treadmill and prior to starting his walk on the track for his first station, asymptomatic, no chest pain. Experienced right hip pain with rating of 6 on 0/10 pain scale at the onset of warming up on the treadmill. Changed to walking the track due to the right hip pain and he shared pain levels decreased in hip with walking the track. Medication list reconciled at orientation. Pt denies barriers to medication compliance , he took his medications as usual this morning. PSYCHOSOCIAL ASSESSMENT:  PHQ-0. Pt exhibits positive coping skills, hopeful outlook with supportive daughter. He shared a friend of his recently passed away and experiencing emotions over the loss. No psychosocial needs identified at this time, no psychosocial interventions necessary.  Will continue to offer encouragement and support.  Pt oriented to exercise equipment and routine.    Understanding verbalized. Contacted Angie Duke, PA  at 8:04am and discussed arrhythmias during exercise session this morning. Reported patient asymptomatic during session, agreed with discharge from cardiac rehab. Faxed ecg tracing to HF clinic (8:25am) attention to Dr. [name] for review and advisement as necessary.

## 2021-07-25 NOTE — Telephone Encounter (Signed)
Daniel Joseph with cardiac rehab called to notify pt attended his first day at cardiac rehab today and had a 5-beat run of NSVT with some PVCs. Pt was asymptomatic. He denied chest pain. Nearing the end of his exercise, PVCs were reduced.   In review of his last heart monitor, he has known PVCs and NSVT. 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 Daniel Joseph states he did well with his session this morning and has no significant concerns with him returning home today. Based on her description, I agree. She was unable to reach anyone in AHF clinic. I will send a message to Dr. [name].

## 2021-07-27 ENCOUNTER — Other Ambulatory Visit: Payer: Self-pay

## 2021-07-27 ENCOUNTER — Encounter (HOSPITAL_COMMUNITY)
Admission: RE | Admit: 2021-07-27 | Discharge: 2021-07-27 | Disposition: A | Discharge status: Home / Self Care | Payer: Medicaid Other | Source: Ambulatory Visit | Attending: Cardiology | Admitting: Cardiology

## 2021-07-27 DIAGNOSIS — Z9889 Other specified postprocedural states: Secondary | ICD-10-CM | POA: Diagnosis not present

## 2021-07-30 ENCOUNTER — Encounter (HOSPITAL_COMMUNITY)
Admission: RE | Admit: 2021-07-30 | Discharge: 2021-07-30 | Disposition: A | Discharge status: Home / Self Care | Payer: Medicaid Other | Source: Ambulatory Visit | Attending: Cardiology | Admitting: Cardiology

## 2021-07-30 ENCOUNTER — Encounter (HOSPITAL_COMMUNITY): Payer: Medicaid Other

## 2021-07-30 ENCOUNTER — Other Ambulatory Visit: Payer: Self-pay

## 2021-07-30 DIAGNOSIS — Z9889 Other specified postprocedural states: Secondary | ICD-10-CM

## 2021-08-01 ENCOUNTER — Other Ambulatory Visit: Payer: Self-pay

## 2021-08-01 ENCOUNTER — Encounter (HOSPITAL_COMMUNITY): Payer: Medicaid Other

## 2021-08-01 ENCOUNTER — Encounter (HOSPITAL_COMMUNITY)
Admission: RE | Admit: 2021-08-01 | Discharge: 2021-08-01 | Disposition: A | Discharge status: Home / Self Care | Payer: Medicaid Other | Source: Ambulatory Visit | Attending: Cardiology | Admitting: Cardiology

## 2021-08-01 DIAGNOSIS — Z9889 Other specified postprocedural states: Secondary | ICD-10-CM

## 2021-08-03 ENCOUNTER — Other Ambulatory Visit: Payer: Self-pay

## 2021-08-03 ENCOUNTER — Encounter (HOSPITAL_COMMUNITY): Payer: Medicaid Other

## 2021-08-03 ENCOUNTER — Encounter (HOSPITAL_COMMUNITY)
Admission: RE | Admit: 2021-08-03 | Discharge: 2021-08-03 | Disposition: A | Discharge status: Home / Self Care | Payer: Medicaid Other | Source: Ambulatory Visit | Attending: Cardiology | Admitting: Cardiology

## 2021-08-03 DIAGNOSIS — Z9889 Other specified postprocedural states: Secondary | ICD-10-CM

## 2021-08-06 ENCOUNTER — Encounter (HOSPITAL_COMMUNITY)
Admission: RE | Admit: 2021-08-06 | Discharge: 2021-08-06 | Disposition: A | Discharge status: Home / Self Care | Payer: Medicaid Other | Source: Ambulatory Visit | Attending: Cardiology | Admitting: Cardiology

## 2021-08-06 ENCOUNTER — Encounter (HOSPITAL_COMMUNITY): Payer: Medicaid Other

## 2021-08-06 ENCOUNTER — Other Ambulatory Visit: Payer: Self-pay

## 2021-08-06 DIAGNOSIS — Z9889 Other specified postprocedural states: Secondary | ICD-10-CM | POA: Diagnosis not present

## 2021-08-08 ENCOUNTER — Other Ambulatory Visit: Payer: Self-pay

## 2021-08-08 ENCOUNTER — Encounter (HOSPITAL_COMMUNITY)
Admission: RE | Admit: 2021-08-08 | Discharge: 2021-08-08 | Disposition: A | Discharge status: Home / Self Care | Payer: Medicaid Other | Source: Ambulatory Visit | Attending: Cardiology | Admitting: Cardiology

## 2021-08-08 ENCOUNTER — Encounter (HOSPITAL_COMMUNITY): Payer: Medicaid Other

## 2021-08-08 DIAGNOSIS — Z9889 Other specified postprocedural states: Secondary | ICD-10-CM

## 2021-08-10 ENCOUNTER — Encounter (HOSPITAL_COMMUNITY): Payer: Medicaid Other

## 2021-08-13 ENCOUNTER — Telehealth (HOSPITAL_COMMUNITY): Payer: Self-pay | Admitting: *Deleted

## 2021-08-13 ENCOUNTER — Encounter (HOSPITAL_COMMUNITY): Payer: Medicaid Other

## 2021-08-13 NOTE — Telephone Encounter (Signed)
Patient left message on department voicemail this morning. His incision site is really sore today, therefore he will be absent from cardiac rehab. He plans to return on Wednesday.  Sol Passer, [place], ACSM CEP  08/13/21 [phone]

## 2021-08-14 NOTE — Progress Notes (Signed)
Cardiac Individual Treatment Plan  Patient Details  Name: Daniel Joseph MRN: [id number] Date of Birth: [birth year] Referring Provider:   Flowsheet Row CARDIAC REHAB PHASE II ORIENTATION from 07/19/2021 in [facility]  Referring Provider Loralie Champagne, MD       Initial Encounter Date:  [name] PHASE II ORIENTATION from 07/19/2021 in [place]  Date 07/19/21       Visit Diagnosis: 04/23/21 S/P mitral valve repair at [facility]  04/23/21 S/P tricuspid valve repair at [facility]  04/23/21 S/P Maze procedure removal of tricuspid mass.  Patient's Home Medications on Admission:  Current Outpatient Medications:    acetaminophen (TYLENOL) 650 MG CR tablet, Take 1,300 mg by mouth every 8 (eight) hours as needed for pain., Disp: , Rfl:    amLODipine (NORVASC) 10 MG tablet, Take 10 mg by mouth daily with lunch., Disp: , Rfl:    bisacodyl (DULCOLAX) 5 MG EC tablet, Take 1 tablet (5 mg total) by mouth daily as needed for moderate constipation., Disp: 4 tablet, Rfl: 0   carvedilol (COREG) 25 MG tablet, Take 1.5 tablets (37.5 mg total) by mouth 2 (two) times daily., Disp: 270 tablet, Rfl: 3   ELIQUIS 5 MG TABS tablet, TAKE 1 TABLET (5 MG TOTAL) BY MOUTH 2 (TWO) TIMES DAILY (AM+EVENING), Disp: 60 tablet, Rfl: 3   furosemide (LASIX) 40 MG tablet, Take 1 tablet (40 mg total) by mouth 2 (two) times daily., Disp: 60 tablet, Rfl: 6   hydrALAZINE (APRESOLINE) 50 MG tablet, Take 1 tablet (50 mg total) by mouth 3 (three) times daily., Disp: 270 tablet, Rfl: 3   isosorbide mononitrate (IMDUR) 60 MG 24 hr tablet, Take 1.5 tablets (90 mg total) by mouth daily., Disp: 135 tablet, Rfl: 3   losartan (COZAAR) 100 MG tablet, Take 1 tablet (100 mg total) by mouth in the morning AND 0.5 tablets (50 mg total) every evening., Disp: 135 tablet, Rfl: 3   Multiple Vitamin (MULTIVITAMIN WITH MINERALS) TABS tablet, Take 1 tablet by mouth daily., Disp: 30  tablet, Rfl: 0   rosuvastatin (CRESTOR) 10 MG tablet, TAKE 1 TABLET (10 MG TOTAL) BY MOUTH DAILY (BEDTIME), Disp: 90 tablet, Rfl: 3   spironolactone (ALDACTONE) 50 MG tablet, Take 1 tablet (50 mg total) by mouth daily., Disp: 90 tablet, Rfl: 3  Past Medical History: Past Medical History:  Diagnosis Date   Arthritis    back   Benign essential HTN 01/19/2014   Renal Artery [place] 6/16:  No RAS, No AAA   Cardiomyopathy, dilated, nonischemic (HCC)    EF 45-50% bye echo 08/2015 and EF 50-55% by echo 10/2017   Chronic combined systolic and diastolic CHF (congestive heart failure) ([place]) 01/21/2014   Echocardiogram 5.2020    Echo 01/2019: EF 40-45, mild LVH, Gr 3 DD (restrictive filling), normal RVSF, MR may be severe, mod TR, 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, PASP 79   Gout    H/O atrial flutter 05/2020   but back to NSR   Mitral valve regurgitation    mild to moderate MR with MVP of ANMVL by echo 2019   PVC's (premature ventricular contractions)    nonsustained VT as well as PVCs - no ICD indicated due to EF 40-45%   Sleep apnea    pending second sleep study    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    Tobacco Use: Social History   Tobacco Use  Smoking  Status Never  Smokeless Tobacco Never    Labs: Recent Review Flowsheet Data     Labs for ITP Cardiac and Pulmonary Rehab Latest Ref Rng & Units 06/08/2020 06/08/2020 11/09/2020 11/09/2020 02/09/2021   Cholestrol 0 - 200 mg/dL - - - - 99   LDLCALC 0 - 99 mg/dL - - - - 48   HDL >40 mg/dL - - - - 41   Trlycerides <150 mg/dL - - - - 51   Hemoglobin A1c 4.8 - 5.6 % - - - - -   HCO3 20.0 - 28.0 mmol/L - - 22.9 21.9 -   TCO2 22 - 32 mmol/L 25 21(L) 24 23 -   ACIDBASEDEF 0.0 - 2.0 mmol/L - - 3.0(H) 4.0(H) -   O2SAT % - - 74.0 70.0 -       Capillary Blood Glucose: No results found for: GLUCAP   Exercise Target Goals: Exercise Program Goal: Individual exercise prescription set using results from  initial 6 min walk test and THRR while considering  patient's activity barriers and safety.   Exercise Prescription Goal: Starting with aerobic activity 30 plus minutes a day, 3 days per week for initial exercise prescription. Provide home exercise prescription and guidelines that participant acknowledges understanding prior to discharge.  Activity Barriers & Risk Stratification:  Activity Barriers & Cardiac Risk Stratification - 07/19/21 1044       Activity Barriers & Cardiac Risk Stratification   Activity Barriers Back Problems;Shortness of Breath;Balance Concerns    Cardiac Risk Stratification High             6 Minute Walk:  6 Minute Walk     Row Name 07/19/21 1042         6 Minute Walk   Phase Initial     Distance 1541 feet     Walk Time 6 minutes     # of Rest Breaks 0     MPH 2.92     METS 3.49     RPE 7     Perceived Dyspnea  0     VO2 Peak 12.23     Symptoms No     Resting HR 69 bpm     Resting BP 122/78     Resting Oxygen Saturation  98 %     Exercise Oxygen Saturation  during 6 min walk 99 %     Max Ex. HR 1.64 bpm     Max Ex. BP 128/78     2 Minute Post BP 114/66              Oxygen Initial Assessment:   Oxygen Re-Evaluation:   Oxygen Discharge (Final Oxygen Re-Evaluation):   Initial Exercise Prescription:  Initial Exercise Prescription - 07/19/21 1000       Date of Initial Exercise RX and Referring Provider   Date 07/19/21    Referring Provider Loralie Champagne, MD    Expected Discharge Date 09/14/21      Treadmill   MPH 2.5    Grade 1    Minutes 15    METs 3.26      Arm Ergometer   Level 2    RPM 60    Minutes 15    METs 2.2      Prescription Details   Frequency (times per week) 3    Duration Progress to 30 minutes of continuous aerobic without signs/symptoms of physical distress      Intensity   THRR 40-80% of Max Heartrate 65-131  Ratings of Perceived Exertion 11-13    Perceived Dyspnea 0-4      Progression    Progression Continue progressive overload as per policy without signs/symptoms or physical distress.      Resistance Training   Training Prescription Yes    Weight 4 lbs    Reps 10-15             Perform Capillary Blood Glucose checks as needed.  Exercise Prescription Changes:   Exercise Prescription Changes     Row Name 07/25/21 0830 08/15/21 0946           Response to Exercise   Blood Pressure (Admit) 146/80 108/70      Blood Pressure (Exercise) 150/80 95/61      Blood Pressure (Exit) 150/84 92/62      Heart Rate (Admit) 66 bpm 77 bpm      Heart Rate (Exercise) 85 bpm 84 bpm      Heart Rate (Exit) 65 bpm 70 bpm      Rating of Perceived Exertion (Exercise) 11 9      Perceived Dyspnea (Exercise) 0 0      Symptoms 6/10 hip pain on TM, switched to track --      Comments Pt first day of exercise in the CRP2 program Reviewed MET's goals and home exercise      Duration Progress to 30 minutes of  aerobic without signs/symptoms of physical distress Progress to 30 minutes of  aerobic without signs/symptoms of physical distress      Intensity THRR unchanged THRR unchanged        Progression   Progression Continue to progress workloads to maintain intensity without signs/symptoms of physical distress. Continue to progress workloads to maintain intensity without signs/symptoms of physical distress.      Average METs 2.53 2.8        Resistance Training   Training Prescription No  Weightless wednesday No  Weightless wednesday        Treadmill   MPH 1.9 --      Grade 0 --      Minutes 5 --      METs 2.45 --        Arm Ergometer   Level 2 3      RPM 60 60      Minutes 15 15      METs 2.2 2.8        Track   Laps 16 16      Minutes 15 15      METs 2.86 2.86        Home Exercise Plan   Plans to continue exercise at -- Home (comment)      Frequency -- Add 4 additional days to program exercise sessions.      Initial Home Exercises Provided -- 08/15/21                Exercise Comments:   Exercise Comments     Row Name 07/25/21 0830           Exercise Comments Pt first day in the CRP2. Pt is learning his THRR, RPE and Ex Rx. TM caused pt Right hip pain 6/10 so discontinued and resumed on walking track. Will continue to monitor pt and progress workloads as tolerated without sign or symptom (P)                 Exercise Goals and Review:   Exercise Goals     Row Name 07/19/21 1045  Exercise Goals   Increase Physical Activity Yes       Intervention Provide advice, education, support and counseling about physical activity/exercise needs.;Develop an individualized exercise prescription for aerobic and resistive training based on initial evaluation findings, risk stratification, comorbidities and participant's personal goals.       Expected Outcomes Short Term: Attend rehab on a regular basis to increase amount of physical activity.;Long Term: Add in home exercise to make exercise part of routine and to increase amount of physical activity.;Long Term: Exercising regularly at least 3-5 days a week.       Increase Strength and Stamina Yes       Intervention Provide advice, education, support and counseling about physical activity/exercise needs.;Develop an individualized exercise prescription for aerobic and resistive training based on initial evaluation findings, risk stratification, comorbidities and participant's personal goals.       Expected Outcomes Short Term: Increase workloads from initial exercise prescription for resistance, speed, and METs.;Short Term: Perform resistance training exercises routinely during rehab and add in resistance training at home;Long Term: Improve cardiorespiratory fitness, muscular endurance and strength as measured by increased METs and functional capacity (6MWT)       Able to understand and use rate of perceived exertion (RPE) scale Yes       Intervention Provide education and explanation on how to  use RPE scale       Expected Outcomes Short Term: Able to use RPE daily in rehab to express subjective intensity level;Long Term:  Able to use RPE to guide intensity level when exercising independently       Knowledge and understanding of Target Heart Rate Range (THRR) Yes       Intervention Provide education and explanation of THRR including how the numbers were predicted and where they are located for reference       Expected Outcomes Short Term: Able to state/look up THRR;Long Term: Able to use THRR to govern intensity when exercising independently;Short Term: Able to use daily as guideline for intensity in rehab       Understanding of Exercise Prescription Yes       Intervention Provide education, explanation, and written materials on patient's individual exercise prescription       Expected Outcomes Short Term: Able to explain program exercise prescription;Long Term: Able to explain home exercise prescription to exercise independently                Exercise Goals Re-Evaluation :  Exercise Goals Re-Evaluation     [place] Name 07/25/21 0830 08/15/21 0830           Exercise Goal Re-Evaluation   Exercise Goals Review Increase Physical Activity;Understanding of Exercise Prescription;Increase Strength and Stamina;Knowledge and understanding of Target Heart Rate Range (THRR);Able to understand and use rate of perceived exertion (RPE) scale Increase Physical Activity;Understanding of Exercise Prescription;Increase Strength and Stamina;Knowledge and understanding of Target Heart Rate Range (THRR);Able to understand and use rate of perceived exertion (RPE) scale      Comments Pt first day in the CRP2. Pt is learning his THRR, RPE and Ex Rx. TM caused pt Right hip pain 6/10 so discontinued and resumed on walking track. Reviewed MET's goals and home exercise with pt today. Pt average MET level of 2.8. Pt is very active. Pt walks daily for over an hour a session. Pt wants to add in flexibility training  and gave pt resources. Pt feels like he is doing well with his goals of controlling his SOB and feeling like he  has been in better health.      Expected Outcomes Will continue to monitor pt and progress workloads as tolerated without sign or symptom. Pt will continue to exercise on his own at home. Will continue to monitor pt and progress workloads as tolerated without sign or symptom.                Discharge Exercise Prescription (Final Exercise Prescription Changes):  Exercise Prescription Changes - 08/15/21 0946       Response to Exercise   Blood Pressure (Admit) 108/70    Blood Pressure (Exercise) 95/61    Blood Pressure (Exit) 92/62    Heart Rate (Admit) 77 bpm    Heart Rate (Exercise) 84 bpm    Heart Rate (Exit) 70 bpm    Rating of Perceived Exertion (Exercise) 9    Perceived Dyspnea (Exercise) 0    Comments Reviewed MET's goals and home exercise    Duration Progress to 30 minutes of  aerobic without signs/symptoms of physical distress    Intensity THRR unchanged      Progression   Progression Continue to progress workloads to maintain intensity without signs/symptoms of physical distress.    Average METs 2.8      Resistance Training   Training Prescription No   Weightless wednesday     Arm Ergometer   Level 3    RPM 60    Minutes 15    METs 2.8      Track   Laps 16    Minutes 15    METs 2.86      Home Exercise Plan   Plans to continue exercise at Home (comment)    Frequency Add 4 additional days to program exercise sessions.    Initial Home Exercises Provided 08/15/21             Nutrition:  Target Goals: Understanding of nutrition guidelines, daily intake of sodium <1563m, cholesterol <208m calories 30% from fat and 7% or less from saturated fats, daily to have 5 or more servings of fruits and vegetables.  Biometrics:  Pre Biometrics - 07/19/21 0845       Pre Biometrics   Waist Circumference 44 inches    Hip Circumference 46 inches     Waist to Hip Ratio 0.96 %    Triceps Skinfold 8 mm    % Body Fat 28.1 %    Grip Strength 48 kg    Flexibility 0 in   unable to reach box   Single Leg Stand 6.31 seconds              Nutrition Therapy Plan and Nutrition Goals:   Nutrition Assessments:  MEDIFICTS Score Key: ?70 Need to make dietary changes  40-70 Heart Healthy Diet ? 40 Therapeutic Level Cholesterol Diet   Picture Your Plate Scores: <4<32nhealthy dietary pattern with much room for improvement. 41-50 Dietary pattern unlikely to meet recommendations for good health and room for improvement. 51-60 More healthful dietary pattern, with some room for improvement.  >60 Healthy dietary pattern, although there may be some specific behaviors that could be improved.    Nutrition Goals Re-Evaluation:   Nutrition Goals Discharge (Final Nutrition Goals Re-Evaluation):   Psychosocial: Target Goals: Acknowledge presence or absence of significant depression and/or stress, maximize coping skills, provide positive support system. Participant is able to verbalize types and ability to use techniques and skills needed for reducing stress and depression.  Initial Review & Psychosocial Screening:  Initial Psych Review & Screening -  07/19/21 1350       Initial Review   Current issues with None Identified      Family Dynamics   Good Support System? Yes   Cadin lives alone. Arland has his daughter for support     Barriers   Psychosocial barriers to participate in program There are no identifiable barriers or psychosocial needs.      Screening Interventions   Interventions Encouraged to exercise             Quality of Life Scores:  Quality of Life - 07/19/21 1036       Quality of Life   Select Quality of Life      Quality of Life Scores   Health/Function Pre 25.6 %    Socioeconomic Pre 29.25 %    Psych/Spiritual Pre 30 %    Family Pre 26.4 %    GLOBAL Pre 27.43 %            Scores of 19 and below  usually indicate a poorer quality of life in these areas.  A difference of  2-3 points is a clinically meaningful difference.  A difference of 2-3 points in the total score of the Quality of Life Index has been associated with significant improvement in overall quality of life, self-image, physical symptoms, and general health in studies assessing change in quality of life.  PHQ-9: Recent Review Flowsheet Data     Depression screen [facility] 2/9 07/19/2021 02/01/2020 09/02/2018 05/21/2018 03/18/2018   Decreased Interest 0 0 0 0 0   Down, Depressed, Hopeless 0 0 0 0 1   PHQ - 2 Score 0 0 0 0 1      Interpretation of Total Score  Total Score Depression Severity:  1-4 = Minimal depression, 5-9 = Mild depression, 10-14 = Moderate depression, 15-19 = Moderately severe depression, 20-27 = Severe depression   Psychosocial Evaluation and Intervention:   Psychosocial Re-Evaluation:  Psychosocial Re-Evaluation     [place] Name 08/14/21 1617             Psychosocial Re-Evaluation   Current issues with None Identified       Interventions Encouraged to attend Cardiac Rehabilitation for the exercise       Continue Psychosocial Services  No Follow up required                Psychosocial Discharge (Final Psychosocial Re-Evaluation):  Psychosocial Re-Evaluation - 08/14/21 1617       Psychosocial Re-Evaluation   Current issues with None Identified    Interventions Encouraged to attend Cardiac Rehabilitation for the exercise    Continue Psychosocial Services  No Follow up required             Vocational Rehabilitation: Provide vocational rehab assistance to qualifying candidates.   Vocational Rehab Evaluation & Intervention:  Vocational Rehab - 07/19/21 1352       Initial Vocational Rehab Evaluation & Intervention   Assessment shows need for Vocational Rehabilitation No   Yosef is disabled and does not need vocational rehab at this time.            Education: Education Goals:  Education classes will be provided on a weekly basis, covering required topics. Participant will state understanding/return demonstration of topics presented.  Learning Barriers/Preferences:  Learning Barriers/Preferences - 07/19/21 1041       Learning Barriers/Preferences   Learning Barriers None    Learning Preferences Skilled Demonstration;Individual Instruction;Group Instruction  Education Topics: Hypertension, Hypertension Reduction -Define heart disease and high blood pressure. Discus how high blood pressure affects the body and ways to reduce high blood pressure.   Exercise and Your Heart -Discuss why it is important to exercise, the FITT principles of exercise, normal and abnormal responses to exercise, and how to exercise safely.   Angina -Discuss definition of angina, causes of angina, treatment of angina, and how to decrease risk of having angina.   Cardiac Medications -Review what the following cardiac medications are used for, how they affect the body, and side effects that may occur when taking the medications.  Medications include Aspirin, Beta blockers, calcium channel blockers, ACE Inhibitors, angiotensin receptor blockers, diuretics, digoxin, and antihyperlipidemics.   Congestive Heart Failure -Discuss the definition of CHF, how to live with CHF, the signs and symptoms of CHF, and how keep track of weight and sodium intake.   Heart Disease and Intimacy -Discus the effect sexual activity has on the heart, how changes occur during intimacy as we age, and safety during sexual activity.   Smoking Cessation / COPD -Discuss different methods to quit smoking, the health benefits of quitting smoking, and the definition of COPD.   Nutrition I: Fats -Discuss the types of cholesterol, what cholesterol does to the heart, and how cholesterol levels can be controlled.   Nutrition II: Labels -Discuss the different components of food labels and how to  read food label   Heart Parts/Heart Disease and PAD -Discuss the anatomy of the heart, the pathway of blood circulation through the heart, and these are affected by heart disease.   Stress I: Signs and Symptoms -Discuss the causes of stress, how stress may lead to anxiety and depression, and ways to limit stress.   Stress II: Relaxation -Discuss different types of relaxation techniques to limit stress.   Warning Signs of Stroke / TIA -Discuss definition of a stroke, what the signs and symptoms are of a stroke, and how to identify when someone is having stroke.   Knowledge Questionnaire Score:  Knowledge Questionnaire Score - 07/19/21 1039       Knowledge Questionnaire Score   Pre Score 12/24             Core Components/Risk Factors/Patient Goals at Admission:  Personal Goals and Risk Factors at Admission - 07/19/21 1039       Core Components/Risk Factors/Patient Goals on Admission    Weight Management Yes;Weight Maintenance    Intervention Weight Management: Develop a combined nutrition and exercise program designed to reach desired caloric intake, while maintaining appropriate intake of nutrient and fiber, sodium and fats, and appropriate energy expenditure required for the weight goal.;Weight Management: Provide education and appropriate resources to help participant work on and attain dietary goals.    Expected Outcomes Weight Maintenance: Understanding of the daily nutrition guidelines, which includes 25-35% calories from fat, 7% or less cal from saturated fats, less than 249m cholesterol, less than 1.5gm of sodium, & 5 or more servings of fruits and vegetables daily;Long Term: Adherence to nutrition and physical activity/exercise program aimed toward attainment of established weight goal;Short Term: Continue to assess and modify interventions until short term weight is achieved;Understanding recommendations for meals to include 15-35% energy as protein, 25-35% energy from  fat, 35-60% energy from carbohydrates, less than 2057mof dietary cholesterol, 20-35 gm of total fiber daily;Understanding of distribution of calorie intake throughout the day with the consumption of 4-5 meals/snacks    Hypertension Yes    Intervention Provide education  on lifestyle modifcations including regular physical activity/exercise, weight management, moderate sodium restriction and increased consumption of fresh fruit, vegetables, and low fat dairy, alcohol moderation, and smoking cessation.;Monitor prescription use compliance.    Expected Outcomes Short Term: Continued assessment and intervention until BP is < 140/63m HG in hypertensive participants. < 130/86mHG in hypertensive participants with diabetes, heart failure or chronic kidney disease.;Long Term: Maintenance of blood pressure at goal levels.             Core Components/Risk Factors/Patient Goals Review:   Goals and Risk Factor Review     Row Name 08/14/21 1618             Core Components/Risk Factors/Patient Goals Review   Personal Goals Review Weight Management/Obesity;Hypertension       Review Baruch has been doing well with exercise at phase 2 cardiac rehab when in attendance. Xaiver has had some moderate resting and exertional BP elevation. Will continue to monitor bP       Expected Outcomes Daxten will continue to participte in phase 2 cardiac rehab for exercise, nutrition and lifestyle modifications                Core Components/Risk Factors/Patient Goals at Discharge (Final Review):   Goals and Risk Factor Review - 08/14/21 1618       Core Components/Risk Factors/Patient Goals Review   Personal Goals Review Weight Management/Obesity;Hypertension    Review Riad has been doing well with exercise at phase 2 cardiac rehab when in attendance. Liahm has had some moderate resting and exertional BP elevation. Will continue to monitor bP    Expected Outcomes Pancho will continue to participte in phase 2  cardiac rehab for exercise, nutrition and lifestyle modifications             ITP Comments:  ITP Comments     Row Name 07/19/21 0939 08/14/21 1615         ITP Comments Dr TrFransico HimD, Medical Director 30 Day ITP Review. Haruto has good participation and fair attendance in phase 2 cardiac rehab.               Comments: See ITP comments.MaHarrell GaveN BSN

## 2021-08-15 ENCOUNTER — Other Ambulatory Visit: Payer: Self-pay

## 2021-08-15 ENCOUNTER — Encounter (HOSPITAL_COMMUNITY)
Admission: RE | Admit: 2021-08-15 | Discharge: 2021-08-15 | Disposition: A | Discharge status: Home / Self Care | Payer: Medicaid Other | Source: Ambulatory Visit | Attending: Cardiology | Admitting: Cardiology

## 2021-08-15 ENCOUNTER — Encounter (HOSPITAL_COMMUNITY): Payer: Medicaid Other

## 2021-08-15 DIAGNOSIS — Z9889 Other specified postprocedural states: Secondary | ICD-10-CM

## 2021-08-15 NOTE — Progress Notes (Signed)
CARDIAC REHAB PHASE 2  Reviewed home exercise with pt today. Pt is tolerating exercise well. Pt will continue to exercise on their own by walking and doing flexibility training at home for an hour per session 4 days a week in addition to the 3 days in CRP2. Advised pt on THRR, RPE scale, hydration and temperature/humidity precautions. Reinforced S/S to stop exercise and when to call MD vs 911. Encouraged warm up cool down and stretches with exercise sessions. Pt verbalized understanding, all questions were answered and pt was given a copy to take home.    Kirby Funk ACSM-EP 08/15/2021 10:00 AM

## 2021-08-17 ENCOUNTER — Encounter (HOSPITAL_COMMUNITY)
Admission: RE | Admit: 2021-08-17 | Discharge: 2021-08-17 | Disposition: A | Discharge status: Home / Self Care | Payer: Medicaid Other | Source: Ambulatory Visit | Attending: Cardiology | Admitting: Cardiology

## 2021-08-17 ENCOUNTER — Encounter (HOSPITAL_COMMUNITY): Payer: Medicaid Other

## 2021-08-17 ENCOUNTER — Other Ambulatory Visit: Payer: Self-pay

## 2021-08-17 DIAGNOSIS — Z9889 Other specified postprocedural states: Secondary | ICD-10-CM | POA: Diagnosis present

## 2021-08-20 ENCOUNTER — Encounter (HOSPITAL_COMMUNITY)
Admission: RE | Admit: 2021-08-20 | Discharge: 2021-08-20 | Disposition: A | Discharge status: Home / Self Care | Payer: Medicaid Other | Source: Ambulatory Visit | Attending: Cardiology | Admitting: Cardiology

## 2021-08-20 ENCOUNTER — Other Ambulatory Visit: Payer: Self-pay

## 2021-08-20 ENCOUNTER — Encounter (HOSPITAL_COMMUNITY): Payer: Medicaid Other

## 2021-08-20 DIAGNOSIS — Z9889 Other specified postprocedural states: Secondary | ICD-10-CM

## 2021-08-22 ENCOUNTER — Encounter (HOSPITAL_COMMUNITY): Payer: Medicaid Other

## 2021-08-22 ENCOUNTER — Other Ambulatory Visit: Payer: Self-pay

## 2021-08-22 ENCOUNTER — Encounter (HOSPITAL_COMMUNITY)
Admission: RE | Admit: 2021-08-22 | Discharge: 2021-08-22 | Disposition: A | Discharge status: Home / Self Care | Payer: Medicaid Other | Source: Ambulatory Visit | Attending: Cardiology | Admitting: Cardiology

## 2021-08-22 DIAGNOSIS — Z9889 Other specified postprocedural states: Secondary | ICD-10-CM

## 2021-08-24 ENCOUNTER — Encounter (HOSPITAL_COMMUNITY): Payer: Medicaid Other

## 2021-08-27 ENCOUNTER — Encounter (HOSPITAL_COMMUNITY): Payer: Medicaid Other

## 2021-08-27 ENCOUNTER — Other Ambulatory Visit: Payer: Self-pay

## 2021-08-27 ENCOUNTER — Encounter (HOSPITAL_COMMUNITY)
Admission: RE | Admit: 2021-08-27 | Discharge: 2021-08-27 | Disposition: A | Discharge status: Home / Self Care | Payer: Medicaid Other | Source: Ambulatory Visit | Attending: Cardiology | Admitting: Cardiology

## 2021-08-27 DIAGNOSIS — Z9889 Other specified postprocedural states: Secondary | ICD-10-CM

## 2021-08-29 ENCOUNTER — Other Ambulatory Visit: Payer: Self-pay

## 2021-08-29 ENCOUNTER — Encounter (HOSPITAL_COMMUNITY): Payer: Medicaid Other

## 2021-08-29 ENCOUNTER — Encounter (HOSPITAL_COMMUNITY)
Admission: RE | Admit: 2021-08-29 | Discharge: 2021-08-29 | Disposition: A | Discharge status: Home / Self Care | Payer: Medicaid Other | Source: Ambulatory Visit | Attending: Cardiology | Admitting: Cardiology

## 2021-08-29 DIAGNOSIS — Z9889 Other specified postprocedural states: Secondary | ICD-10-CM

## 2021-08-31 ENCOUNTER — Encounter (HOSPITAL_COMMUNITY)
Admission: RE | Admit: 2021-08-31 | Discharge: 2021-08-31 | Disposition: A | Discharge status: Home / Self Care | Payer: Medicaid Other | Source: Ambulatory Visit | Attending: Cardiology | Admitting: Cardiology

## 2021-08-31 ENCOUNTER — Other Ambulatory Visit: Payer: Self-pay

## 2021-08-31 ENCOUNTER — Encounter (HOSPITAL_COMMUNITY): Payer: Medicaid Other

## 2021-08-31 VITALS — Ht 70.5 in | Wt 231.3 lb

## 2021-08-31 DIAGNOSIS — Z9889 Other specified postprocedural states: Secondary | ICD-10-CM

## 2021-09-03 ENCOUNTER — Encounter (HOSPITAL_COMMUNITY)
Admission: RE | Admit: 2021-09-03 | Discharge: 2021-09-03 | Disposition: A | Discharge status: Home / Self Care | Payer: Medicaid Other | Source: Ambulatory Visit | Attending: Cardiology | Admitting: Cardiology

## 2021-09-03 ENCOUNTER — Other Ambulatory Visit: Payer: Self-pay

## 2021-09-03 ENCOUNTER — Encounter (HOSPITAL_COMMUNITY): Payer: Medicaid Other

## 2021-09-03 DIAGNOSIS — Z9889 Other specified postprocedural states: Secondary | ICD-10-CM

## 2021-09-05 ENCOUNTER — Encounter (HOSPITAL_COMMUNITY)
Admission: RE | Admit: 2021-09-05 | Discharge: 2021-09-05 | Disposition: A | Discharge status: Home / Self Care | Payer: Medicaid Other | Source: Ambulatory Visit | Attending: Cardiology | Admitting: Cardiology

## 2021-09-05 ENCOUNTER — Other Ambulatory Visit: Payer: Self-pay

## 2021-09-05 ENCOUNTER — Encounter (HOSPITAL_COMMUNITY): Payer: Medicaid Other

## 2021-09-05 DIAGNOSIS — Z9889 Other specified postprocedural states: Secondary | ICD-10-CM | POA: Diagnosis not present

## 2021-09-05 NOTE — Progress Notes (Signed)
Cardiac Individual Treatment Plan  Patient Details  Name: Daniel Joseph MRN: [id number] Date of Birth: 06/03/1961 Referring Provider:   Flowsheet Row CARDIAC REHAB PHASE II ORIENTATION from 07/19/2021 in [place]  Referring Provider Loralie Champagne, MD       Initial Encounter Date:  [place] PHASE II ORIENTATION from 07/19/2021 in [place]  Date 07/19/21       Visit Diagnosis: 04/23/21 S/P mitral valve repair at [facility]  04/23/21 S/P tricuspid valve repair at [facility]  04/23/21 S/P [place] procedure removal of tricuspid mass.  Patient's Home Medications on Admission:  Current Outpatient Medications:    acetaminophen (TYLENOL) 650 MG CR tablet, Take 1,300 mg by mouth every 8 (eight) hours as needed for pain., Disp: , Rfl:    amLODipine (NORVASC) 10 MG tablet, Take 10 mg by mouth daily with lunch., Disp: , Rfl:    bisacodyl (DULCOLAX) 5 MG EC tablet, Take 1 tablet (5 mg total) by mouth daily as needed for moderate constipation., Disp: 4 tablet, Rfl: 0   carvedilol (COREG) 25 MG tablet, Take 1.5 tablets (37.5 mg total) by mouth 2 (two) times daily., Disp: 270 tablet, Rfl: 3   ELIQUIS 5 MG TABS tablet, TAKE 1 TABLET (5 MG TOTAL) BY MOUTH 2 (TWO) TIMES DAILY (AM+EVENING), Disp: 60 tablet, Rfl: 3   furosemide (LASIX) 40 MG tablet, Take 1 tablet (40 mg total) by mouth 2 (two) times daily., Disp: 60 tablet, Rfl: 6   hydrALAZINE (APRESOLINE) 50 MG tablet, Take 1 tablet (50 mg total) by mouth 3 (three) times daily., Disp: 270 tablet, Rfl: 3   isosorbide mononitrate (IMDUR) 60 MG 24 hr tablet, Take 1.5 tablets (90 mg total) by mouth daily., Disp: 135 tablet, Rfl: 3   losartan (COZAAR) 100 MG tablet, Take 1 tablet (100 mg total) by mouth in the morning AND 0.5 tablets (50 mg total) every evening., Disp: 135 tablet, Rfl: 3   Multiple Vitamin (MULTIVITAMIN WITH MINERALS) TABS tablet, Take 1 tablet by mouth daily., Disp: 30  tablet, Rfl: 0   rosuvastatin (CRESTOR) 10 MG tablet, TAKE 1 TABLET (10 MG TOTAL) BY MOUTH DAILY (BEDTIME), Disp: 90 tablet, Rfl: 3   spironolactone (ALDACTONE) 50 MG tablet, Take 1 tablet (50 mg total) by mouth daily., Disp: 90 tablet, Rfl: 3  Past Medical History: Past Medical History:  Diagnosis Date   Arthritis    back   Benign essential HTN 01/19/2014   Renal Artery [place] 6/16:  No RAS, No AAA   Cardiomyopathy, dilated, nonischemic (HCC)    EF 45-50% bye echo 08/2015 and EF 50-55% by echo 10/2017   Chronic combined systolic and diastolic CHF (congestive heart failure) ([place]) 01/21/2014   Echocardiogram 5.2020    Echo 01/2019: EF 40-45, mild LVH, Gr 3 DD (restrictive filling), normal RVSF, MR may be severe, mod TR, 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, PASP 79   Gout    H/O atrial flutter 05/2020   but back to NSR   Mitral valve regurgitation    mild to moderate MR with MVP of ANMVL by echo 2019   PVC's (premature ventricular contractions)    nonsustained VT as well as PVCs - no ICD indicated due to EF 40-45%   Sleep apnea    pending second sleep study    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    Tobacco Use: Social History   Tobacco Use  Smoking  Status Never  Smokeless Tobacco Never    Labs: Recent Review Flowsheet Data     Labs for ITP Cardiac and Pulmonary Rehab Latest Ref Rng & Units 06/08/2020 06/08/2020 11/09/2020 11/09/2020 02/09/2021   Cholestrol 0 - 200 mg/dL - - - - 99   LDLCALC 0 - 99 mg/dL - - - - 48   HDL >40 mg/dL - - - - 41   Trlycerides <150 mg/dL - - - - 51   Hemoglobin A1c 4.8 - 5.6 % - - - - -   HCO3 20.0 - 28.0 mmol/L - - 22.9 21.9 -   TCO2 22 - 32 mmol/L 25 21(L) 24 23 -   ACIDBASEDEF 0.0 - 2.0 mmol/L - - 3.0(H) 4.0(H) -   O2SAT % - - 74.0 70.0 -       Capillary Blood Glucose: No results found for: GLUCAP   Exercise Target Goals: Exercise Program Goal: Individual exercise prescription set using results from  initial 6 min walk test and THRR while considering  patients activity barriers and safety.   Exercise Prescription Goal: Starting with aerobic activity 30 plus minutes a day, 3 days per week for initial exercise prescription. Provide home exercise prescription and guidelines that participant acknowledges understanding prior to discharge.  Activity Barriers & Risk Stratification:  Activity Barriers & Cardiac Risk Stratification - 07/19/21 1044       Activity Barriers & Cardiac Risk Stratification   Activity Barriers Back Problems;Shortness of Breath;Balance Concerns    Cardiac Risk Stratification High             6 Minute Walk:  6 Minute Walk     Row Name 07/19/21 1042 08/31/21 0830       6 Minute Walk   Phase Initial Discharge    Distance 1541 feet 1800 feet    Distance % Change -- 16.81 %    Distance Feet Change -- 259 ft    Walk Time 6 minutes 6 minutes    # of Rest Breaks 0 0    MPH 2.92 3.41    METS 3.49 3.84    RPE 7 7    Perceived Dyspnea  0 0    VO2 Peak 12.23 13.44    Symptoms No No    Resting HR 69 bpm 68 bpm    Resting BP 122/78 110/62    Resting Oxygen Saturation  98 % --    Exercise Oxygen Saturation  during 6 min walk 99 % 100 %    Max Ex. HR 1.64 bpm 94 bpm    Max Ex. BP 128/78 95/60    2 Minute Post BP 114/66 93/58             Oxygen Initial Assessment:   Oxygen Re-Evaluation:   Oxygen Discharge (Final Oxygen Re-Evaluation):   Initial Exercise Prescription:  Initial Exercise Prescription - 07/19/21 1000       Date of Initial Exercise RX and Referring Provider   Date 07/19/21    Referring Provider Loralie Champagne, MD    Expected Discharge Date 09/14/21      Treadmill   MPH 2.5    Grade 1    Minutes 15    METs 3.26      Arm Ergometer   Level 2    RPM 60    Minutes 15    METs 2.2      Prescription Details   Frequency (times per week) 3    Duration Progress to 30 minutes of  continuous aerobic without signs/symptoms of  physical distress      Intensity   THRR 40-80% of Max Heartrate 65-131    Ratings of Perceived Exertion 11-13    Perceived Dyspnea 0-4      Progression   Progression Continue progressive overload as per policy without signs/symptoms or physical distress.      Resistance Training   Training Prescription Yes    Weight 4 lbs    Reps 10-15             Perform Capillary Blood Glucose checks as needed.  Exercise Prescription Changes:   Exercise Prescription Changes     Row Name 07/25/21 0830 08/15/21 0946 08/22/21 0830 08/31/21 0830 09/05/21 0830     Response to Exercise   Blood Pressure (Admit) 146/80 108/70 126/76 -- 132/84   Blood Pressure (Exercise) 150/80 95/61 124/80 -- --   Blood Pressure (Exit) 150/84 92/62 104/76 -- 110/68   Heart Rate (Admit) 66 bpm 77 bpm 69 bpm -- 63 bpm   Heart Rate (Exercise) 85 bpm 84 bpm 82 bpm -- 90 bpm   Heart Rate (Exit) 65 bpm 70 bpm 68 bpm -- 71 bpm   Rating of Perceived Exertion (Exercise) '11 9 9 ' -- 10   Perceived Dyspnea (Exercise) 0 0 0 -- 0   Symptoms 6/10 hip pain on TM, switched to track -- -- -- --   Comments Pt first day of exercise in the CRP2 program Reviewed MET's goals and home exercise Reviewed MET's -- Reviewed MET's and goals   Duration Progress to 30 minutes of  aerobic without signs/symptoms of physical distress Progress to 30 minutes of  aerobic without signs/symptoms of physical distress Progress to 30 minutes of  aerobic without signs/symptoms of physical distress -- Progress to 30 minutes of  aerobic without signs/symptoms of physical distress   Intensity THRR unchanged THRR unchanged THRR unchanged -- THRR unchanged     Progression   Progression Continue to progress workloads to maintain intensity without signs/symptoms of physical distress. Continue to progress workloads to maintain intensity without signs/symptoms of physical distress. Continue to progress workloads to maintain intensity without signs/symptoms of  physical distress. -- Continue to progress workloads to maintain intensity without signs/symptoms of physical distress.   Average METs 2.53 2.8 3 -- 3.1     Resistance Training   Training Prescription No  Weightless wednesday No  Weightless wednesday No  Weightless wednesday --  Weightless wednesday No  Weightless wednesday     Treadmill   MPH 1.9 -- 2.5 -- 2.5   Grade 0 -- 0 -- 3   Minutes 5 -- 10 -- 10   METs 2.45 -- 2.91 -- 3.95     Arm Ergometer   Level 2 3 2.5 -- 3.5   RPM 60 60 60 -- 60   Minutes '15 15 10 ' -- 10   METs 2.2 2.8 3 -- 3.2     Track   Laps '16 16 12 ' -- 15   Minutes '15 15 10 ' -- 10   METs 2.86 2.86 3.09 -- 2.47     Home Exercise Plan   Plans to continue exercise at -- Home (comment) Home (comment) -- Home (comment)   Frequency -- Add 4 additional days to program exercise sessions. Add 4 additional days to program exercise sessions. -- Add 4 additional days to program exercise sessions.   Initial Home Exercises Provided -- 08/15/21 08/15/21 -- 08/15/21  Exercise Comments:   Exercise Comments     Row Name 07/25/21 0830 08/22/21 0830 09/05/21 0830       Exercise Comments Pt first day in the CRP2. Pt is learning his THRR, RPE and Ex Rx. TM caused pt Right hip pain 6/10 so discontinued and resumed on walking track. Will continue to monitor pt and progress workloads as tolerated without sign or symptom (P)  Reviewed MET's today with pt. Pt is tolerating exercise well with an average MET level of 3.0. Will continut to monitor pt and progress workloads as tolerated without sign or symptom Reviewed MET's and  goals. Pt average MET level of 3.1. Pt feel much better than when he started and states his SOB has gotten easier to manage,              Exercise Goals and Review:   Exercise Goals     Row Name 07/19/21 1045             Exercise Goals   Increase Physical Activity Yes       Intervention Provide advice, education, support and counseling  about physical activity/exercise needs.;Develop an individualized exercise prescription for aerobic and resistive training based on initial evaluation findings, risk stratification, comorbidities and participant's personal goals.       Expected Outcomes Short Term: Attend rehab on a regular basis to increase amount of physical activity.;Long Term: Add in home exercise to make exercise part of routine and to increase amount of physical activity.;Long Term: Exercising regularly at least 3-5 days a week.       Increase Strength and Stamina Yes       Intervention Provide advice, education, support and counseling about physical activity/exercise needs.;Develop an individualized exercise prescription for aerobic and resistive training based on initial evaluation findings, risk stratification, comorbidities and participant's personal goals.       Expected Outcomes Short Term: Increase workloads from initial exercise prescription for resistance, speed, and METs.;Short Term: Perform resistance training exercises routinely during rehab and add in resistance training at home;Long Term: Improve cardiorespiratory fitness, muscular endurance and strength as measured by increased METs and functional capacity (6MWT)       Able to understand and use rate of perceived exertion (RPE) scale Yes       Intervention Provide education and explanation on how to use RPE scale       Expected Outcomes Short Term: Able to use RPE daily in rehab to express subjective intensity level;Long Term:  Able to use RPE to guide intensity level when exercising independently       Knowledge and understanding of Target Heart Rate Range (THRR) Yes       Intervention Provide education and explanation of THRR including how the numbers were predicted and where they are located for reference       Expected Outcomes Short Term: Able to state/look up THRR;Long Term: Able to use THRR to govern intensity when exercising independently;Short Term: Able to  use daily as guideline for intensity in rehab       Understanding of Exercise Prescription Yes       Intervention Provide education, explanation, and written materials on patient's individual exercise prescription       Expected Outcomes Short Term: Able to explain program exercise prescription;Long Term: Able to explain home exercise prescription to exercise independently                Exercise Goals Re-Evaluation :  Exercise Goals Re-Evaluation  [place] Name 07/25/21 0830 08/15/21 0830 09/05/21 0830         Exercise Goal Re-Evaluation   Exercise Goals Review Increase Physical Activity;Understanding of Exercise Prescription;Increase Strength and Stamina;Knowledge and understanding of Target Heart Rate Range (THRR);Able to understand and use rate of perceived exertion (RPE) scale Increase Physical Activity;Understanding of Exercise Prescription;Increase Strength and Stamina;Knowledge and understanding of Target Heart Rate Range (THRR);Able to understand and use rate of perceived exertion (RPE) scale Increase Physical Activity;Understanding of Exercise Prescription;Increase Strength and Stamina;Knowledge and understanding of Target Heart Rate Range (THRR);Able to understand and use rate of perceived exertion (RPE) scale     Comments Pt first day in the CRP2. Pt is learning his THRR, RPE and Ex Rx. TM caused pt Right hip pain 6/10 so discontinued and resumed on walking track. Reviewed MET's goals and home exercise with pt today. Pt average MET level of 2.8. Pt is very active. Pt walks daily for over an hour a session. Pt wants to add in flexibility training and gave pt resources. Pt feels like he is doing well with his goals of controlling his SOB and feeling like he has been in better health. Reviewed MET's and  goals. Pt average MET level of 3.1. Pt feel much better than when he started and states his SOB has gotten easier to manage,     Expected Outcomes Will continue to monitor pt and progress  workloads as tolerated without sign or symptom. Pt will continue to exercise on his own at home. Will continue to monitor pt and progress workloads as tolerated without sign or symptom. Will continue to monitor pt and progress workloads as tolerated without sign or symptom.               Discharge Exercise Prescription (Final Exercise Prescription Changes):  Exercise Prescription Changes - 09/05/21 0830       Response to Exercise   Blood Pressure (Admit) 132/84    Blood Pressure (Exit) 110/68    Heart Rate (Admit) 63 bpm    Heart Rate (Exercise) 90 bpm    Heart Rate (Exit) 71 bpm    Rating of Perceived Exertion (Exercise) 10    Perceived Dyspnea (Exercise) 0    Comments Reviewed MET's and goals    Duration Progress to 30 minutes of  aerobic without signs/symptoms of physical distress    Intensity THRR unchanged      Progression   Progression Continue to progress workloads to maintain intensity without signs/symptoms of physical distress.    Average METs 3.1      Resistance Training   Training Prescription No   Weightless wednesday     Treadmill   MPH 2.5    Grade 3    Minutes 10    METs 3.95      Arm Ergometer   Level 3.5    RPM 60    Minutes 10    METs 3.2      Track   Laps 15    Minutes 10    METs 2.47      Home Exercise Plan   Plans to continue exercise at Home (comment)    Frequency Add 4 additional days to program exercise sessions.    Initial Home Exercises Provided 08/15/21             Nutrition:  Target Goals: Understanding of nutrition guidelines, daily intake of sodium <1518m, cholesterol <2063m calories 30% from fat and 7% or less from saturated fats, daily to have 5  or more servings of fruits and vegetables.  Biometrics:  Pre Biometrics - 07/19/21 0845       Pre Biometrics   Waist Circumference 44 inches    Hip Circumference 46 inches    Waist to Hip Ratio 0.96 %    Triceps Skinfold 8 mm    % Body Fat 28.1 %    Grip Strength 48 kg     Flexibility 0 in   unable to reach box   Single Leg Stand 6.31 seconds             Post Biometrics - 08/31/21 0830        Post  Biometrics   Height 5' 10.5" (1.791 m)    Weight 104.9 kg    Waist Circumference 42.25 inches    Hip Circumference 44.5 inches    Waist to Hip Ratio 0.95 %    BMI (Calculated) 32.7    Triceps Skinfold 10 mm    % Body Fat 28.2 %    Grip Strength 45 kg    Flexibility 10.5 in    Single Leg Stand 3.7 seconds             Nutrition Therapy Plan and Nutrition Goals:   Nutrition Assessments:  MEDIFICTS Score Key: ?70 Need to make dietary changes  40-70 Heart Healthy Diet ? 40 Therapeutic Level Cholesterol Diet   Picture Your Plate Scores: <76 Unhealthy dietary pattern with much room for improvement. 41-50 Dietary pattern unlikely to meet recommendations for good health and room for improvement. 51-60 More healthful dietary pattern, with some room for improvement.  >60 Healthy dietary pattern, although there may be some specific behaviors that could be improved.    Nutrition Goals Re-Evaluation:   Nutrition Goals Discharge (Final Nutrition Goals Re-Evaluation):   Psychosocial: Target Goals: Acknowledge presence or absence of significant depression and/or stress, maximize coping skills, provide positive support system. Participant is able to verbalize types and ability to use techniques and skills needed for reducing stress and depression.  Initial Review & Psychosocial Screening:  Initial Psych Review & Screening - 07/19/21 1350       Initial Review   Current issues with None Identified      Family Dynamics   Good Support System? Yes   Emre lives alone. Thaddius has his daughter for support     Barriers   Psychosocial barriers to participate in program There are no identifiable barriers or psychosocial needs.      Screening Interventions   Interventions Encouraged to exercise             Quality of Life Scores:   Quality of Life - 07/19/21 1036       Quality of Life   Select Quality of Life      Quality of Life Scores   Health/Function Pre 25.6 %    Socioeconomic Pre 29.25 %    Psych/Spiritual Pre 30 %    Family Pre 26.4 %    GLOBAL Pre 27.43 %            Scores of 19 and below usually indicate a poorer quality of life in these areas.  A difference of  2-3 points is a clinically meaningful difference.  A difference of 2-3 points in the total score of the Quality of Life Index has been associated with significant improvement in overall quality of life, self-image, physical symptoms, and general health in studies assessing change in quality of life.  PHQ-9: Recent Review Flowsheet Data  Depression screen [facility] 2/9 07/19/2021 02/01/2020 09/02/2018 05/21/2018 03/18/2018   Decreased Interest 0 0 0 0 0   Down, Depressed, Hopeless 0 0 0 0 1   PHQ - 2 Score 0 0 0 0 1      Interpretation of Total Score  Total Score Depression Severity:  1-4 = Minimal depression, 5-9 = Mild depression, 10-14 = Moderate depression, 15-19 = Moderately severe depression, 20-27 = Severe depression   Psychosocial Evaluation and Intervention:   Psychosocial Re-Evaluation:  Psychosocial Re-Evaluation     [place] Name 08/14/21 1617 09/05/21 1727           Psychosocial Re-Evaluation   Current issues with None Identified None Identified      Interventions Encouraged to attend Cardiac Rehabilitation for the exercise Encouraged to attend Cardiac Rehabilitation for the exercise      Continue Psychosocial Services  No Follow up required No Follow up required               Psychosocial Discharge (Final Psychosocial Re-Evaluation):  Psychosocial Re-Evaluation - 09/05/21 1727       Psychosocial Re-Evaluation   Current issues with None Identified    Interventions Encouraged to attend Cardiac Rehabilitation for the exercise    Continue Psychosocial Services  No Follow up required             Vocational  Rehabilitation: Provide vocational rehab assistance to qualifying candidates.   Vocational Rehab Evaluation & Intervention:  Vocational Rehab - 07/19/21 1352       Initial Vocational Rehab Evaluation & Intervention   Assessment shows need for Vocational Rehabilitation No   H[place]son is disabled and does not need vocational rehab at this time.            Education: Education Goals: Education classes will be provided on a weekly basis, covering required topics. Participant will state understanding/return demonstration of topics presented.  Learning Barriers/Preferences:  Learning Barriers/Preferences - 07/19/21 1041       Learning Barriers/Preferences   Learning Barriers None    Learning Preferences Skilled Demonstration;Individual Instruction;Group Instruction             Education Topics: Hypertension, Hypertension Reduction -Define heart disease and high blood pressure. Discus how high blood pressure affects the body and ways to reduce high blood pressure.   Exercise and Your Heart -Discuss why it is important to exercise, the FITT principles of exercise, normal and abnormal responses to exercise, and how to exercise safely.   Angina -Discuss definition of angina, causes of angina, treatment of angina, and how to decrease risk of having angina.   Cardiac Medications -Review what the following cardiac medications are used for, how they affect the body, and side effects that may occur when taking the medications.  Medications include Aspirin, Beta blockers, calcium channel blockers, ACE Inhibitors, angiotensin receptor blockers, diuretics, digoxin, and antihyperlipidemics.   Congestive Heart Failure -Discuss the definition of CHF, how to live with CHF, the signs and symptoms of CHF, and how keep track of weight and sodium intake.   Heart Disease and Intimacy -Discus the effect sexual activity has on the heart, how changes occur during intimacy as we age, and safety  during sexual activity.   Smoking Cessation / COPD -Discuss different methods to quit smoking, the health benefits of quitting smoking, and the definition of COPD.   Nutrition I: Fats -Discuss the types of cholesterol, what cholesterol does to the heart, and how cholesterol levels can be controlled.   Nutrition II:  Labels -Discuss the different components of food labels and how to read food label   Heart Parts/Heart Disease and PAD -Discuss the anatomy of the heart, the pathway of blood circulation through the heart, and these are affected by heart disease.   Stress I: Signs and Symptoms -Discuss the causes of stress, how stress may lead to anxiety and depression, and ways to limit stress.   Stress II: Relaxation -Discuss different types of relaxation techniques to limit stress.   Warning Signs of Stroke / TIA -Discuss definition of a stroke, what the signs and symptoms are of a stroke, and how to identify when someone is having stroke.   Knowledge Questionnaire Score:  Knowledge Questionnaire Score - 07/19/21 1039       Knowledge Questionnaire Score   Pre Score 12/24             Core Components/Risk Factors/Patient Goals at Admission:  Personal Goals and Risk Factors at Admission - 07/19/21 1039       Core Components/Risk Factors/Patient Goals on Admission    Weight Management Yes;Weight Maintenance    Intervention Weight Management: Develop a combined nutrition and exercise program designed to reach desired caloric intake, while maintaining appropriate intake of nutrient and fiber, sodium and fats, and appropriate energy expenditure required for the weight goal.;Weight Management: Provide education and appropriate resources to help participant work on and attain dietary goals.    Expected Outcomes Weight Maintenance: Understanding of the daily nutrition guidelines, which includes 25-35% calories from fat, 7% or less cal from saturated fats, less than 240m  cholesterol, less than 1.5gm of sodium, & 5 or more servings of fruits and vegetables daily;Long Term: Adherence to nutrition and physical activity/exercise program aimed toward attainment of established weight goal;Short Term: Continue to assess and modify interventions until short term weight is achieved;Understanding recommendations for meals to include 15-35% energy as protein, 25-35% energy from fat, 35-60% energy from carbohydrates, less than 2074mof dietary cholesterol, 20-35 gm of total fiber daily;Understanding of distribution of calorie intake throughout the day with the consumption of 4-5 meals/snacks    Hypertension Yes    Intervention Provide education on lifestyle modifcations including regular physical activity/exercise, weight management, moderate sodium restriction and increased consumption of fresh fruit, vegetables, and low fat dairy, alcohol moderation, and smoking cessation.;Monitor prescription use compliance.    Expected Outcomes Short Term: Continued assessment and intervention until BP is < 140/9048mG in hypertensive participants. < 130/42m60m in hypertensive participants with diabetes, heart failure or chronic kidney disease.;Long Term: Maintenance of blood pressure at goal levels.             Core Components/Risk Factors/Patient Goals Review:   Goals and Risk Factor Review     Row Name 08/14/21 1618             Core Components/Risk Factors/Patient Goals Review   Personal Goals Review Weight Management/Obesity;Hypertension       Review Malak has been doing well with exercise at phase 2 cardiac rehab when in attendance. Jaciel has had some moderate resting and exertional BP elevation. Will continue to monitor bP       Expected Outcomes Seldon will continue to participte in phase 2 cardiac rehab for exercise, nutrition and lifestyle modifications                Core Components/Risk Factors/Patient Goals at Discharge (Final Review):   Goals and Risk Factor  Review - 08/14/21 1618       Core Components/Risk  Factors/Patient Goals Review   Personal Goals Review Weight Management/Obesity;Hypertension    Review Yer has been doing well with exercise at phase 2 cardiac rehab when in attendance. Chipper has had some moderate resting and exertional BP elevation. Will continue to monitor bP    Expected Outcomes Wirt will continue to participte in phase 2 cardiac rehab for exercise, nutrition and lifestyle modifications             ITP Comments:  ITP Comments     Row Name 07/19/21 0939 08/14/21 1615 09/05/21 1726       ITP Comments Dr Fransico Him MD, Medical Director 30 Day ITP Review. Frenchie has good participation and fair attendance in phase 2 cardiac rehab. 30 Day ITP Review. Jerome has good participation and fair attendance in phase 2 cardiac rehab.              Comments: See ITP comments.Harrell Gave RN BSN

## 2021-09-07 ENCOUNTER — Encounter (HOSPITAL_COMMUNITY)
Admission: RE | Admit: 2021-09-07 | Discharge: 2021-09-07 | Disposition: A | Discharge status: Home / Self Care | Payer: Medicaid Other | Source: Ambulatory Visit | Attending: Cardiology | Admitting: Cardiology

## 2021-09-07 ENCOUNTER — Encounter (HOSPITAL_COMMUNITY): Payer: Medicaid Other

## 2021-09-07 ENCOUNTER — Other Ambulatory Visit: Payer: Self-pay

## 2021-09-07 DIAGNOSIS — Z9889 Other specified postprocedural states: Secondary | ICD-10-CM | POA: Diagnosis not present

## 2021-09-12 ENCOUNTER — Telehealth (HOSPITAL_COMMUNITY): Payer: Self-pay | Admitting: *Deleted

## 2021-09-12 ENCOUNTER — Encounter (HOSPITAL_COMMUNITY): Payer: Medicaid Other

## 2021-09-12 NOTE — Telephone Encounter (Signed)
Patient left message on department voicemail this morning. He will be absent from cardiac rehab today due to an upset stomach. Plans to return on Friday 09/14/21.

## 2021-09-14 ENCOUNTER — Encounter (HOSPITAL_COMMUNITY)
Admission: RE | Admit: 2021-09-14 | Payer: Medicaid Other | Source: Ambulatory Visit | Attending: Cardiology | Admitting: Cardiology

## 2021-09-14 ENCOUNTER — Other Ambulatory Visit: Payer: Self-pay

## 2021-09-14 ENCOUNTER — Encounter (HOSPITAL_COMMUNITY): Payer: Medicaid Other

## 2021-09-19 ENCOUNTER — Encounter (HOSPITAL_COMMUNITY)
Admission: RE | Admit: 2021-09-19 | Discharge: 2021-09-19 | Disposition: A | Discharge status: Home / Self Care | Payer: Medicaid Other | Source: Ambulatory Visit | Attending: Cardiology | Admitting: Cardiology

## 2021-09-19 ENCOUNTER — Ambulatory Visit (HOSPITAL_COMMUNITY)
Admission: RE | Admit: 2021-09-19 | Discharge: 2021-09-19 | Disposition: A | Discharge status: Home / Self Care | Payer: Medicaid Other | Source: Ambulatory Visit | Attending: Cardiology | Admitting: Cardiology

## 2021-09-19 ENCOUNTER — Encounter (HOSPITAL_COMMUNITY): Payer: Self-pay | Admitting: Cardiology

## 2021-09-19 ENCOUNTER — Other Ambulatory Visit: Payer: Self-pay

## 2021-09-19 VITALS — BP 110/78 | HR 66 | Wt 233.0 lb

## 2021-09-19 DIAGNOSIS — D869 Sarcoidosis, unspecified: Secondary | ICD-10-CM | POA: Diagnosis not present

## 2021-09-19 DIAGNOSIS — Z9889 Other specified postprocedural states: Secondary | ICD-10-CM | POA: Insufficient documentation

## 2021-09-19 DIAGNOSIS — I4891 Unspecified atrial fibrillation: Secondary | ICD-10-CM | POA: Diagnosis not present

## 2021-09-19 DIAGNOSIS — I2510 Atherosclerotic heart disease of native coronary artery without angina pectoris: Secondary | ICD-10-CM | POA: Diagnosis not present

## 2021-09-19 DIAGNOSIS — I5042 Chronic combined systolic (congestive) and diastolic (congestive) heart failure: Secondary | ICD-10-CM | POA: Insufficient documentation

## 2021-09-19 DIAGNOSIS — I130 Hypertensive heart and chronic kidney disease with heart failure and stage 1 through stage 4 chronic kidney disease, or unspecified chronic kidney disease: Secondary | ICD-10-CM | POA: Insufficient documentation

## 2021-09-19 DIAGNOSIS — I428 Other cardiomyopathies: Secondary | ICD-10-CM | POA: Diagnosis not present

## 2021-09-19 DIAGNOSIS — R590 Localized enlarged lymph nodes: Secondary | ICD-10-CM | POA: Diagnosis not present

## 2021-09-19 DIAGNOSIS — Z7901 Long term (current) use of anticoagulants: Secondary | ICD-10-CM | POA: Diagnosis not present

## 2021-09-19 DIAGNOSIS — Z79899 Other long term (current) drug therapy: Secondary | ICD-10-CM | POA: Insufficient documentation

## 2021-09-19 DIAGNOSIS — I4892 Unspecified atrial flutter: Secondary | ICD-10-CM | POA: Insufficient documentation

## 2021-09-19 DIAGNOSIS — N183 Chronic kidney disease, stage 3 unspecified: Secondary | ICD-10-CM | POA: Diagnosis not present

## 2021-09-19 LAB — BRAIN NATRIURETIC PEPTIDE: B Natriuretic Peptide: 247 pg/mL — ABNORMAL HIGH (ref 0.0–100.0)

## 2021-09-19 LAB — BASIC METABOLIC PANEL
Anion gap: 6 (ref 5–15)
BUN: 19 mg/dL (ref 6–20)
CO2: 24 mmol/L (ref 22–32)
Calcium: 8.8 mg/dL — ABNORMAL LOW (ref 8.9–10.3)
Chloride: 102 mmol/L (ref 98–111)
Creatinine, Ser: 1.2 mg/dL (ref 0.61–1.24)
GFR, Estimated: >60 mL/min (ref >60)
Glucose, Bld: 81 mg/dL (ref 70–99)
Potassium: 4.5 mmol/L (ref 3.5–5.1)
Sodium: 132 mmol/L — ABNORMAL LOW (ref 135–145)

## 2021-09-19 MED ORDER — FUROSEMIDE 40 MG PO TABS: ORAL_TABLET | ORAL | 3 refills | Status: DC

## 2021-09-19 NOTE — Progress Notes (Signed)
PCP: [name], MD Cardiology: Dr. [name] HF Cardiology: Dr. [name]  61 y.o. 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 [name] for evaluation of CHF. 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  Cardiolite in 5/16 showed no ischemia but possible apical infarction.  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  PYP scan was read as equivocal in 5/20 but probably not suggestive of transthyretin amyloidosis.   TEE was done in 6/20 to assess severity of MR.  EF was 50% with mild mitral valve prolapse and moderate MR.  RHC/LHC showed nonobstructive CAD, pulmonary venous hypertension, relatively controlled filling pressures. 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   Echo in 3/21 showed EF 55-60%, normal RV, severe LAE, moderate-severe MR.    Patient's BP has been poorly controlled over time.  He has not been consistent with medication compliance.  Renal artery dopplers were unremarkable in 6/16.   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   He had atrial flutter in 9/21 but has been back in NSR.   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  LHC/RHC in 2/22 showed 50% mLAD stenosis, 80% dLAD, prominent v-waves on PCWP tracing. High resolution CT chest in 3/22 showed numerous mediastinal and axillary lymph nodes,  consider sarcoidosis versus lymphoproliferative disease.  Cardiac PET in 4/22 at [facility] showed EF 42%, areas of active inflammation in LV myocardial concerning for cardiac sarcoidosis.  Repeat echo in 5/22 showed EF 50-55%, moderate LV enlargement, low normal RV function, moderate RV enlargement, moderate-severe MR with dilated IVC.   Patient was seen at the sarcoid clinic at [facility] by Dr. Weyman Croon.  PET was reviewed, he was suspected to have "burned out" sarcoid with minimal activity.  Bronchoscopy with biopsy in 7/22 was negative for sarcoidosis.  He was evaluated by cardiac surgery at [facility], and had MV repair/TV repair/Maze in 8/22. Echo post-op in 8/22 showed EF > 55%, moderate LVH, normal RV, s/p MV repair with mean gradient 3 and no MR, s/p TV repair with mean gradient 1 and mild TR, PASP 57 mmHg.   Patient returns for followup of CHF.  He has been doing cardiac rehab. Weight is stable.  He feels good, no significant exertional dyspnea.  No chest pain.  No lightheadedness.  No palpitations.   ECG (personally reviewed): NSR, LVH with poor RWP  Patient presents for followup of CHF.  BP is controlled.  Weight is stable.   Labs (5/20): K 3.5, creatinine 1.23 Labs (6/20): Urine immunofixation negative, K 4.2, creatinine 1.36 Labs (7/20): creatinine 1.43 Labs (9/20): uric acid 7.5, LDL 67 Labs (1/21): K 3.9, creatinine 1.33, BNP 388, ACE level low Labs (5/21): LDL 57 Labs (9/21): BNP 804, K 4.1, creatinine 1.42 Labs (12/21): K 4.8, creatinine 1.3 Labs (2/22): K 4.7, creatinine 1.07, ACE level 46 (normal) Labs (4/22): K 4.4, creatinine 1.32 Labs (5/22): LDL 48 Labs (6/22): K 3.9, creatinine  1.22 Labs (8/22): K 4.5, creatinine 1.77 Labs (9/22): hgb 11.5, K 4.5, creatinine 1.72  PMH: 1. Gout 2. H/o NSVT/PVCs: Zio patch 1/22 with rare PVCs, few short NSVT runs.  3. 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  Seen on multiple echoes, first in 2015.  4. HTN: Renal artery dopplers in 6/16 were  unremarkable. Sleep study about 2 years ago was negative per patient's report.  5. Mitral regurgitation: TEE in 2015 with mild to moderate MR.  - Echo (2/20): Moderate MR.  - Echo (5/20): Possible severe MR - TEE (6/20): Mild mitral valve prolapse with moderate MR.  - Echo (3/21): Moderate to severe MR.  - TEE (9/21): moderate-severe MR with prolapse of A2 (eccentric MR).  - Cardiac MRI (1/22) with severe MR (regurgitant fraction 54%, regurgitant volume 83 cc).  - Echo (5/22): moderate-severe MR with prolapse A2 - S/p MV repair at [facility] in 8/22.  Post-op echo in 8/22 with MV repair, mean gradient 3 mmHg, no MR.  6. 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  - Cardiolite (5/16): Possible apical infarct, no ischemia.  - Echo (7/19): EF 50-55%.  - Echo (2/20): EF 50-55%, moderate MR, TV fibroelastoma.  - PYP scan (5/20): Grade 1 visually, H/CL 1.23.  Read as equivocal but likely negative for TTR amyloidosis.  - Echo (5/20): EF 40-45%, mildly dilated LV with mild LVH, normal RV size and systolic function, possible severe MR, moderate TR, 1 cm nodule on TV may be fibroelastoma, PASP 79 mmHg.  - TEE (6/20): EF 50%, mild LV dilation, mild LVH, moderate LAE, tricuspid valve mass likely fibroelastoma, mild MVP with moderate MR.  - LHC/RHC (6/20): 50% LAD, 30% pRCA; mean RA 4, PA 55/21 mean 34, mean PCWP 17, CI 2.66, PVR 2.8 WU.  - Cardiac MRI (8/20): EF 53%, mild LVH, inferoseptal/inferior mid-wall LGE (?cardiac sarcoidosis, does not look like amyloidosis), mild RV dilation with RVEF 54%, moderate eccentric MR, severe LAE. - Echo (3/21):  EF 55-60%, normal RV, severe LAE, moderate-severe MR. 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  - Cardiac MRI (1/22): moderate LV dilation with EF 45%, diffuse hypokinesis worse in the  basal-mid inferior and inferoseptal walls, moderate RV dilation with EF 54%, severe MR with regurgitant fraction 54% and regurgitant volume 83 cc, mid-wall LGE in the basal inferoseptal and inferior walls concerning for possible cardiac sarcoidosis.  - RHC/LHC (2/22): 50% mid LAD, 80% dLAD; mean RA 6, PA 61/22 mean 39, mean PCWP 26 with v waves to 45, CI 2.69, PVR 2.13 WU.  - Cardiac PET: 4/22 at [facility] showed EF 42%, areas of active inflammation in LV myocardial concerning for cardiac sarcoidosis. - Bronchoscopy with biopsy in 7/22 did not show sarcoidosis.  - Echo (5/22): EF 50-55%, moderate LV enlargement, low normal RV function, moderate RV enlargement, moderate-severe MR with dilated IVC.  - Echo (8/22, post-MV/TV repair): EF > 55%, moderate LVH, normal RV, s/p MV repair with mean gradient 3 and no MR, s/p TV repair with mean gradient 1 and mild TR, PASP 57 mmHg. 7. Atrial flutter/fibrillation: Noted first in 9/21.  - S/p Maze in 8/22 with MV/TV surgery.  8. CAD: LHC (2/22) with 50% mid LAD, 80% distal LAD stenoses.  Medical management.  9. CT chest (3/22): Numerous mediastinal and axillary lymph nodes, some of which are enlarged. Findings can  be seen with sarcoid or a lymphoproliferative disorder. 10. Tricuspid regurgitation: TV repair in 8/22 with MV repair, mean gradient 1 mmHg with mild TR.  11. CKD stage 3  FH:  Father with MI at 59, brother with MI at 10, mother with CHF, MI.  HTN in multiple family members.   Social History   Socioeconomic History   Marital status: Divorced    Spouse name: Not on file   Number of children: Not on file   Years of education: 12   Highest education level: High school graduate  Occupational History   Occupation: Disabled  Tobacco Use   Smoking status: Never   Smokeless tobacco: Never  Vaping Use   Vaping Use: Never used  Substance and Sexual Activity   Alcohol use: Not Currently   Drug use: Never   Sexual activity: Yes  Other Topics Concern    Not on file  Social History Narrative   Not on file   Social Determinants of Health   Financial Resource Strain: Not on file  Food Insecurity: Not on file  Transportation Needs: Not on file  Physical Activity: Not on file  Stress: Not on file  Social Connections: Not on file  Intimate Partner Violence: Not on file   ROS: All systems reviewed and negative except as per HPI.   Current Outpatient Medications  Medication Sig Dispense Refill   acetaminophen (TYLENOL) 650 MG CR tablet Take 1,300 mg by mouth every 8 (eight) hours as needed for pain.     amLODipine (NORVASC) 10 MG tablet Take 10 mg by mouth daily with lunch.     bisacodyl (DULCOLAX) 5 MG EC tablet Take 1 tablet (5 mg total) by mouth daily as needed for moderate constipation. 4 tablet 0   carvedilol (COREG) 25 MG tablet Take 1.5 tablets (37.5 mg total) by mouth 2 (two) times daily. 270 tablet 3   ELIQUIS 5 MG TABS tablet TAKE 1 TABLET (5 MG TOTAL) BY MOUTH 2 (TWO) TIMES DAILY (AM+EVENING) 60 tablet 3   hydrALAZINE (APRESOLINE) 50 MG tablet Take 1 tablet (50 mg total) by mouth 3 (three) times daily. 270 tablet 3   isosorbide mononitrate (IMDUR) 60 MG 24 hr tablet Take 1.5 tablets (90 mg total) by mouth daily. 135 tablet 3   losartan (COZAAR) 100 MG tablet Take 1 tablet (100 mg total) by mouth in the morning AND 0.5 tablets (50 mg total) every evening. 135 tablet 3   Multiple Vitamin (MULTIVITAMIN WITH MINERALS) TABS tablet Take 1 tablet by mouth daily. 30 tablet 0   rosuvastatin (CRESTOR) 10 MG tablet TAKE 1 TABLET (10 MG TOTAL) BY MOUTH DAILY (BEDTIME) 90 tablet 3   spironolactone (ALDACTONE) 50 MG tablet Take 1 tablet (50 mg total) by mouth daily. 90 tablet 3   furosemide (LASIX) 40 MG tablet Take 1 tablet (40 mg total) by mouth every morning AND 0.5 tablets (20 mg total) every evening. 135 tablet 3   No current facility-administered medications for this encounter.   BP 110/78    Pulse 66    Wt 105.7 kg (233 lb)    SpO2  100%    BMI 32.96 kg/m  General: NAD Neck: No JVD, no thyromegaly or thyroid nodule.  Lungs: Clear to auscultation bilaterally with normal respiratory effort. CV: Nondisplaced PMI.  Heart regular S1/S2, no S3/S4, 1/6 SEM RUSB.  No peripheral edema.  No carotid bruit.  Normal pedal pulses.  Abdomen: Soft, nontender, no hepatosplenomegaly, no distention.  Skin: Intact without  lesions or rashes.  Neurologic: Alert and oriented x 3.  Psych: Normal affect. Extremities: No clubbing or cyanosis.  HEENT: Normal.   Assessment/Plan: 1. HTN: Negative renal artery dopplers and sleep study in past.  On multiple agents, BP controlled.  - Unable to tolerate Entresto due to cough, continue losartan 100 qam/50 qpm.     - Continue amlodipine.  - Continue hydralazine 50 mg tid.  - Continue spironolactone 50 mg daily.  - Continue Coreg 37.5 mg bid.  2. 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  Echo in 5/20 showed EF 40-45% with normal RV, possible severe mitral regurgitation, and severe pulmonary hypertension.  PYP scan in 5/20 was read as equivocal, but I suspect this is not suggestive of transthyretin amyloidosis. TEE in 5/20 showed EF 50%, mild LV dilation, moderate MR.  RHC/LHC showed mildly elevated PCWP, pulmonary venous hypertension, and nonobstructive CAD.  Cardiac MRI in 8/20 showed EF 53% and had an LGE pattern that was suggestive of possible cardiac sarcoidosis (not amyloidosis).  Echo in 3/21 showed EF up to 55-60%.  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  Cardiac MRI in 1/22 with moderate LV dilation with EF 45%, diffuse hypokinesis worse in the basal-mid inferior and inferoseptal walls, moderate RV dilation with EF 54%, severe MR with regurgitant fraction 54% and regurgitant volume 83 cc, mid-wall LGE in the basal inferoseptal and  inferior walls concerning for possible cardiac sarcoidosis. LHC/RHC showed moderate CAD without interventional target, elevated PCWP with prominent v-waves.  Cardiac PET in 4/22 concerning for cardiac amyloidosis.  Bronchoscopy with biopsy in 7/22 was negative for sarcoidosis.  Most recent echo was post-MV/TV, showing EF > 55%, moderate LVH, normal RV, s/p MV repair with mean gradient 3 and no MR, s/p TV repair with mean gradient 1 and mild TR, PASP 57 mmHg.  He is not volume overloaded on exam, NYHA class I.  - I think that he can decrease Lasix to 40 qam/20 qpm.    - Continue losartan 100 qam/50 qpm (does not tolerate Entresto).   - Continue Coreg 37.5 mg bid as above.  - Continue spironolactone 50 mg daily.  - Continue hydralazine 50 mg tid and Imdur 90 mg daily.  - Strong concern for cardiac sarcoidosis based on 4/22 cPET at [facility] and cMRI done here.  High resolution CT chest from 3/22 shows enlarged mediastinal and axillary lymph nodes (could be seen in sarcoidosis versus lymphoproliferative disease).  However, bronchoscopy with biopsy was negative for sarcoidosis in 7/22.  Patient will need repeat cardiac PET in 3/23 (will order at [facility] again) to see if inflammation consistent with sarcoidosis has changed.  If worsened, may need immunosuppression.  If not, will continue current management without immunosuppression.  3. Mitral regurgitation: ?severe on 5/20 TTE but TEE in 6/20 showed mild mitral valve prolapse with only moderate MR.  Cardiac MRI in 8/20 also suggested moderate MR. TTE in 3/21 suggested moderate-severe MR. TEE in 9/21 with moderate-severe MR, A2 prolapse.  Repeat cardiac MRI in 1/22 clearly showed severe MR with regurgitant fraction 54% and regurgitant volume 83 cc.  In 8/22, patient had MV and TV repairs with Maze.  Post-op echo showed stable repaired MV in 8/22.  - Will need endocarditis prophylaxis with dental work.  - Continue cardiac rehab.  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  5. CAD: Moderate CAD without interventional target on 2/22 cath, no chest pain.  - continue Crestor, good lipids in 5/22.  - He is on apixaban so no ASA.  6. Atrial flutter/fibrillation: S/p Maze with MV/TV repairs.  In NSR today.    - Continue Eliquis 5 mg bid.  - He is now off amiodarone.   7. Cardiac sarcoidosis: As above, strong suspicion for cardiac sarcoidosis based on cPET and cMRI.  He has concerning chest lymphadenopathy as well.  We do not yet have a tissue diagnosis.  Bronchoscopy with biopsy in 7/22 was negative for sarcoidosis.  He was seen by Dr. Weyman Croon at the [facility] cardiac sarcoidosis clinic.  The PET was reviewed, and it was thought that sarcoid is not particularly active (possible "burned out" sarcoidosis).   - Plan for repeat PET in 3/23. If inflammation has increased, would plan immunosuppression.  If inflammation is stable/decreased, no treatment.   Followup with APP in 3 months.   Loralie Champagne 09/19/2021

## 2021-09-19 NOTE — Patient Instructions (Addendum)
EKG done today.  Labs done today. We will contact you only if your labs are abnormal.  DECREASE Lasix to 40mg  (1 tablet) by mouth every morning and 20mg  (1/2 tablet) by mouth every evening.   No other medication changes were made. Please continue all current medications as prescribed.  We will be in contact with you to schedule your repeat PET Scan at [facility].   Your physician recommends that you schedule a follow-up appointment in: 3 months with our NP/PA Clinic here in our office. Please contact our office in March to schedule a April appointment.   If you have any questions or concerns before your next appointment please send [place] a message through [place] or call our office at [phone].    TO LEAVE A MESSAGE FOR THE NURSE SELECT OPTION 2, PLEASE LEAVE A MESSAGE INCLUDING: YOUR NAME DATE OF BIRTH CALL BACK NUMBER REASON FOR CALL**this is important as we prioritize the call backs  YOU WILL RECEIVE A CALL BACK THE SAME DAY AS LONG AS YOU CALL BEFORE 4:00 PM   Do the following things EVERYDAY: Weigh yourself in the morning before breakfast. Write it down and keep it in a log. Take your medicines as prescribed Eat low salt foods--Limit salt (sodium) to 2000 mg per day.  Stay as active as you can everyday Limit all fluids for the day to less than 2 liters   At the [facility], you and your health needs are our priority. As part of our continuing mission to provide you with exceptional heart care, we have created designated Provider Care Teams. These Care Teams include your primary Cardiologist (physician) and Advanced Practice Providers (APPs- Physician Assistants and Nurse Practitioners) who all work together to provide you with the care you need, when you need it.   You may see any of the following providers on your designated Care Team at your next follow up: Dr Glori Bickers Dr Haynes Kerns, NP Lyda Jester, [place] Audry Riles,  PharmD   Please be sure to bring in all your medications bottles to every appointment.

## 2021-09-21 ENCOUNTER — Encounter (HOSPITAL_COMMUNITY)
Admission: RE | Admit: 2021-09-21 | Discharge: 2021-09-21 | Disposition: A | Discharge status: Home / Self Care | Payer: Medicaid Other | Source: Ambulatory Visit | Attending: Cardiology | Admitting: Cardiology

## 2021-09-21 ENCOUNTER — Other Ambulatory Visit: Payer: Self-pay

## 2021-09-21 DIAGNOSIS — Z9889 Other specified postprocedural states: Secondary | ICD-10-CM | POA: Diagnosis not present

## 2021-09-21 NOTE — Progress Notes (Signed)
Discharge Progress Report  Patient Details  Name: Daniel Joseph MRN: [id number] Date of Birth: 06/27/1961 Referring Provider:   Flowsheet Row CARDIAC REHAB PHASE II ORIENTATION from 07/19/2021 in [place]  Referring Provider Loralie Champagne, MD        Number of Visits: 19  Reason for Discharge:  Patient reached a stable level of exercise. Patient independent in their exercise. Patient has met program and personal goals.  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  ADL UCSD:   Initial Exercise Prescription:  Initial Exercise Prescription - 07/19/21 1000       Date of Initial Exercise RX and Referring Provider   Date 07/19/21    Referring Provider Loralie Champagne, MD    Expected Discharge Date 09/14/21      Treadmill   MPH 2.5    Grade 1    Minutes 15    METs 3.26      Arm Ergometer   Level 2    RPM 60    Minutes 15    METs 2.2      Prescription Details   Frequency (times per week) 3    Duration Progress to 30 minutes of continuous aerobic without signs/symptoms of physical distress      Intensity   THRR 40-80% of Max Heartrate 65-131    Ratings of Perceived Exertion 11-13    Perceived Dyspnea 0-4      Progression   Progression Continue progressive overload as per policy without signs/symptoms or physical distress.      Resistance Training   Training Prescription Yes    Weight 4 lbs    Reps 10-15             Discharge Exercise Prescription (Final Exercise Prescription Changes):  Exercise Prescription Changes - 09/21/21 0830       Response to Exercise   Blood Pressure (Admit) 138/76    Blood Pressure (Exercise) 142/82    Blood Pressure (Exit) 114/80    Heart Rate (Admit) 74 bpm    Heart Rate  (Exercise) 84 bpm    Heart Rate (Exit) 74 bpm    Rating of Perceived Exertion (Exercise) 10    Perceived Dyspnea (Exercise) 0    Comments Pt graduated the CRP2 program    Duration Progress to 30 minutes of  aerobic without signs/symptoms of physical distress    Intensity THRR unchanged      Progression   Progression Continue to progress workloads to maintain intensity without signs/symptoms of physical distress.    Average METs 3.2      Resistance Training   Training Prescription Yes   Weightless wednesday   Weight 6 lbs    Reps 10-15    Time 10 Minutes      Treadmill   MPH 2.5    Grade 2.5    Minutes 10    METs 3.78      Arm Ergometer   Level 3.5    RPM 60    Minutes 10    METs 3.3      Track   Laps 13    Minutes 10    METs 2.51      Home Exercise Plan   Plans to continue exercise at Home (comment)  Frequency Add 4 additional days to program exercise sessions.    Initial Home Exercises Provided 08/15/21             Functional Capacity:  6 Minute Walk     Row Name 07/19/21 1042 08/31/21 0830       6 Minute Walk   Phase Initial Discharge    Distance 1541 feet 1800 feet    Distance % Change -- 16.81 %    Distance Feet Change -- 259 ft    Walk Time 6 minutes 6 minutes    # of Rest Breaks 0 0    MPH 2.92 3.41    METS 3.49 3.84    RPE 7 7    Perceived Dyspnea  0 0    VO2 Peak 12.23 13.44    Symptoms No No    Resting HR 69 bpm 68 bpm    Resting BP 122/78 110/62    Resting Oxygen Saturation  98 % --    Exercise Oxygen Saturation  during 6 min walk 99 % 100 %    Max Ex. HR 1.64 bpm 94 bpm    Max Ex. BP 128/78 95/60    2 Minute Post BP 114/66 93/58             Psychological, QOL, Others - Outcomes: PHQ 2/9: Depression screen [facility] 2/9 09/21/2021 07/19/2021 02/01/2020 09/02/2018 05/21/2018  Decreased Interest 0 0 0 0 0  Down, Depressed, Hopeless 0 0 0 0 0  PHQ - 2 Score 0 0 0 0 0  Some recent data might be hidden    Quality of Life:  Quality of  Life - 09/21/21 0830       Quality of Life Scores   Health/Function Post 30 %    Socioeconomic Post 29.25 %    Psych/Spiritual Post 30 %    Family Post 30 %    GLOBAL Post 29.83 %             Personal Goals: Goals established at orientation with interventions provided to work toward goal.  Personal Goals and Risk Factors at Admission - 07/19/21 1039       Core Components/Risk Factors/Patient Goals on Admission    Weight Management Yes;Weight Maintenance    Intervention Weight Management: Develop a combined nutrition and exercise program designed to reach desired caloric intake, while maintaining appropriate intake of nutrient and fiber, sodium and fats, and appropriate energy expenditure required for the weight goal.;Weight Management: Provide education and appropriate resources to help participant work on and attain dietary goals.    Expected Outcomes Weight Maintenance: Understanding of the daily nutrition guidelines, which includes 25-35% calories from fat, 7% or less cal from saturated fats, less than 268m cholesterol, less than 1.5gm of sodium, & 5 or more servings of fruits and vegetables daily;Long Term: Adherence to nutrition and physical activity/exercise program aimed toward attainment of established weight goal;Short Term: Continue to assess and modify interventions until short term weight is achieved;Understanding recommendations for meals to include 15-35% energy as protein, 25-35% energy from fat, 35-60% energy from carbohydrates, less than 206mof dietary cholesterol, 20-35 gm of total fiber daily;Understanding of distribution of calorie intake throughout the day with the consumption of 4-5 meals/snacks    Hypertension Yes    Intervention Provide education on lifestyle modifcations including regular physical activity/exercise, weight management, moderate sodium restriction and increased consumption of fresh fruit, vegetables, and low fat dairy, alcohol moderation, and  smoking cessation.;Monitor prescription use compliance.  Expected Outcomes Short Term: Continued assessment and intervention until BP is < 140/17m HG in hypertensive participants. < 130/816mHG in hypertensive participants with diabetes, heart failure or chronic kidney disease.;Long Term: Maintenance of blood pressure at goal levels.              Personal Goals Discharge:  Goals and Risk Factor Review     Row Name 08/14/21 1618 09/05/21 1727           Core Components/Risk Factors/Patient Goals Review   Personal Goals Review Weight Management/Obesity;Hypertension Weight Management/Obesity;Hypertension      Review [place] has been doing well with exercise at phase 2 cardiac rehab when in attendance. [place] has had some moderate resting and exertional BP elevation. Will continue to monitor bP [place] has been doing well with exercise at phase 2 cardiac rehab when in attendance. Leam's recent BP's have been within normal limits. Jun will complete cardiac rehab on 09/14/21.      Expected Outcomes Jonathan will continue to participte in phase 2 cardiac rehab for exercise, nutrition and lifestyle modifications Betzalel will continue to participte in phase 2 cardiac rehab for exercise, nutrition and lifestyle modifications               Exercise Goals and Review:  Exercise Goals     Row Name 07/19/21 1045             Exercise Goals   Increase Physical Activity Yes       Intervention Provide advice, education, support and counseling about physical activity/exercise needs.;Develop an individualized exercise prescription for aerobic and resistive training based on initial evaluation findings, risk stratification, comorbidities and participant's personal goals.       Expected Outcomes Short Term: Attend rehab on a regular basis to increase amount of physical activity.;Long Term: Add in home exercise to make exercise part of routine and to increase amount of physical activity.;Long Term:  Exercising regularly at least 3-5 days a week.       Increase Strength and Stamina Yes       Intervention Provide advice, education, support and counseling about physical activity/exercise needs.;Develop an individualized exercise prescription for aerobic and resistive training based on initial evaluation findings, risk stratification, comorbidities and participant's personal goals.       Expected Outcomes Short Term: Increase workloads from initial exercise prescription for resistance, speed, and METs.;Short Term: Perform resistance training exercises routinely during rehab and add in resistance training at home;Long Term: Improve cardiorespiratory fitness, muscular endurance and strength as measured by increased METs and functional capacity (6MWT)       Able to understand and use rate of perceived exertion (RPE) scale Yes       Intervention Provide education and explanation on how to use RPE scale       Expected Outcomes Short Term: Able to use RPE daily in rehab to express subjective intensity level;Long Term:  Able to use RPE to guide intensity level when exercising independently       Knowledge and understanding of Target Heart Rate Range (THRR) Yes       Intervention Provide education and explanation of THRR including how the numbers were predicted and where they are located for reference       Expected Outcomes Short Term: Able to state/look up THRR;Long Term: Able to use THRR to govern intensity when exercising independently;Short Term: Able to use daily as guideline for intensity in rehab       Understanding of Exercise Prescription Yes  Intervention Provide education, explanation, and written materials on patient's individual exercise prescription       Expected Outcomes Short Term: Able to explain program exercise prescription;Long Term: Able to explain home exercise prescription to exercise independently                Exercise Goals Re-Evaluation:  Exercise Goals Re-Evaluation      Row Name 07/25/21 0830 08/15/21 0830 09/05/21 0830 09/21/21 0830       Exercise Goal Re-Evaluation   Exercise Goals Review Increase Physical Activity;Understanding of Exercise Prescription;Increase Strength and Stamina;Knowledge and understanding of Target Heart Rate Range (THRR);Able to understand and use rate of perceived exertion (RPE) scale Increase Physical Activity;Understanding of Exercise Prescription;Increase Strength and Stamina;Knowledge and understanding of Target Heart Rate Range (THRR);Able to understand and use rate of perceived exertion (RPE) scale Increase Physical Activity;Understanding of Exercise Prescription;Increase Strength and Stamina;Knowledge and understanding of Target Heart Rate Range (THRR);Able to understand and use rate of perceived exertion (RPE) scale Increase Physical Activity;Understanding of Exercise Prescription;Increase Strength and Stamina;Knowledge and understanding of Target Heart Rate Range (THRR);Able to understand and use rate of perceived exertion (RPE) scale    Comments Pt first day in the CRP2. Pt is learning his THRR, RPE and Ex Rx. TM caused pt Right hip pain 6/10 so discontinued and resumed on walking track. Reviewed MET's goals and home exercise with pt today. Pt average MET level of 2.8. Pt is very active. Pt walks daily for over an hour a session. Pt wants to add in flexibility training and gave pt resources. Pt feels like he is doing well with his goals of controlling his SOB and feeling like he has been in better health. Reviewed MET's and  goals. Pt average MET level of 3.1. Pt feel much better than when he started and states his SOB has gotten easier to manage, Pt graduated the [place] program today with an average MET level of 3.2. Pt will continue to exercise on his own by bike riding, walking and joining the [facility] for 3-4 days for 30-45 minutes per session    Expected Outcomes Will continue to monitor pt and progress workloads as tolerated without  sign or symptom. Pt will continue to exercise on his own at home. Will continue to monitor pt and progress workloads as tolerated without sign or symptom. Will continue to monitor pt and progress workloads as tolerated without sign or symptom. Pt will continue to exercise on his own             Nutrition & Weight - Outcomes:  Pre Biometrics - 07/19/21 0845       Pre Biometrics   Waist Circumference 44 inches    Hip Circumference 46 inches    Waist to Hip Ratio 0.96 %    Triceps Skinfold 8 mm    % Body Fat 28.1 %    Grip Strength 48 kg    Flexibility 0 in   unable to reach box   Single Leg Stand 6.31 seconds             Post Biometrics - 08/31/21 0830        Post  Biometrics   Height 5' 10.5" (1.791 m)    Weight 104.9 kg    Waist Circumference 42.25 inches    Hip Circumference 44.5 inches    Waist to Hip Ratio 0.95 %    BMI (Calculated) 32.7    Triceps Skinfold 10 mm    % Body Fat 28.2 %  Grip Strength 45 kg    Flexibility 10.5 in    Single Leg Stand 3.7 seconds             Nutrition:   Nutrition Discharge:   Education Questionnaire Score:  Knowledge Questionnaire Score - 09/21/21 0830       Knowledge Questionnaire Score   Post Score 18/24             Goals reviewed with patient; copy given to patient.Pt graduated from cardiac rehab program on 09/21/21  with completion of 19 exercise sessions in Phase II. Pt maintained good attendance and progressed nicely during his participation in rehab as evidenced by increased MET level.   Medication list reconciled. Repeat  PHQ score-0  .  Pt has made significant lifestyle changes and should be commended for his success. Pt feels he has achieved his goals during cardiac rehab. Ousmane increased his distance on his post exercise walk test by 259 feet.  Pt plans to continue exercise by exercising at the [facility] 3-4 days a week, biking and walking. We are proud of Jupiter's progress!Barnet Pall, RN,BSN 09/25/2021  3:33 PM

## 2021-10-04 ENCOUNTER — Telehealth (HOSPITAL_COMMUNITY): Payer: Self-pay | Admitting: *Deleted

## 2021-11-03 ENCOUNTER — Other Ambulatory Visit (HOSPITAL_COMMUNITY): Payer: Self-pay | Admitting: Cardiology

## 2021-11-09 ENCOUNTER — Ambulatory Visit
Admission: RE | Admit: 2021-11-09 | Discharge: 2021-11-09 | Disposition: A | Discharge status: Home / Self Care | Payer: Medicaid Other | Source: Ambulatory Visit | Attending: Family Medicine | Admitting: Family Medicine

## 2021-11-09 ENCOUNTER — Ambulatory Visit
Admission: RE | Admit: 2021-11-09 | Discharge: 2021-11-09 | Disposition: A | Discharge status: Home / Self Care | Payer: Medicaid Other | Source: Ambulatory Visit | Attending: Pulmonary Disease | Admitting: Pulmonary Disease

## 2021-11-09 ENCOUNTER — Other Ambulatory Visit (HOSPITAL_COMMUNITY): Payer: Self-pay | Admitting: *Deleted

## 2021-11-09 ENCOUNTER — Telehealth (HOSPITAL_COMMUNITY): Payer: Self-pay | Admitting: *Deleted

## 2021-11-09 ENCOUNTER — Other Ambulatory Visit: Payer: Self-pay | Admitting: Family Medicine

## 2021-11-09 DIAGNOSIS — M7989 Other specified soft tissue disorders: Secondary | ICD-10-CM

## 2021-11-09 DIAGNOSIS — M79671 Pain in right foot: Secondary | ICD-10-CM

## 2021-11-09 DIAGNOSIS — R590 Localized enlarged lymph nodes: Secondary | ICD-10-CM | POA: Diagnosis not present

## 2021-11-09 DIAGNOSIS — Z952 Presence of prosthetic heart valve: Secondary | ICD-10-CM | POA: Diagnosis not present

## 2021-11-09 DIAGNOSIS — M7731 Calcaneal spur, right foot: Secondary | ICD-10-CM | POA: Diagnosis not present

## 2021-11-09 DIAGNOSIS — I2510 Atherosclerotic heart disease of native coronary artery without angina pectoris: Secondary | ICD-10-CM | POA: Diagnosis not present

## 2021-11-09 NOTE — Telephone Encounter (Signed)
Pt left vm requesting a refill for gout medication. I called pt to advise him to contact pcp pt did not answer/left detailed vm.

## 2021-11-16 ENCOUNTER — Other Ambulatory Visit: Payer: Medicaid Other

## 2021-12-05 ENCOUNTER — Other Ambulatory Visit (HOSPITAL_COMMUNITY): Payer: Self-pay | Admitting: Cardiology

## 2021-12-05 DIAGNOSIS — I4892 Unspecified atrial flutter: Secondary | ICD-10-CM

## 2022-01-30 ENCOUNTER — Other Ambulatory Visit (HOSPITAL_COMMUNITY): Payer: Self-pay | Admitting: Cardiology

## 2022-05-11 ENCOUNTER — Encounter (HOSPITAL_COMMUNITY): Payer: Self-pay | Admitting: *Deleted

## 2022-05-11 ENCOUNTER — Other Ambulatory Visit: Payer: Self-pay

## 2022-05-11 ENCOUNTER — Ambulatory Visit (HOSPITAL_COMMUNITY)
Admission: EM | Admit: 2022-05-11 | Discharge: 2022-05-11 | Disposition: A | Discharge status: Home / Self Care | Payer: Medicaid Other | Attending: Family Medicine | Admitting: Family Medicine

## 2022-05-11 DIAGNOSIS — L089 Local infection of the skin and subcutaneous tissue, unspecified: Secondary | ICD-10-CM | POA: Diagnosis not present

## 2022-05-11 DIAGNOSIS — B9689 Other specified bacterial agents as the cause of diseases classified elsewhere: Secondary | ICD-10-CM | POA: Diagnosis not present

## 2022-05-11 DIAGNOSIS — I10 Essential (primary) hypertension: Secondary | ICD-10-CM

## 2022-05-11 MED ORDER — DOXYCYCLINE HYCLATE 100 MG PO CAPS: 100.0000 mg | ORAL_CAPSULE | Freq: Two times a day (BID) | ORAL | 0 refills | Status: DC

## 2022-05-11 NOTE — Discharge Instructions (Signed)
Your blood pressure was noted to be elevated during your visit today. If you are currently taking medication for high blood pressure, please ensure you are taking this as directed. If you do not have a history of high blood pressure and your blood pressure remains persistently elevated, you may need to begin taking a medication at some point. You may return here within the next few days to recheck if unable to see your primary care provider or if you do not have a one.  BP (!) 169/103   Pulse 67   Temp 97.6 F (36.4 C)   Resp 18   SpO2 99%   BP Readings from Last 3 Encounters:  05/11/22 (!) 169/103  09/19/21 110/78  07/19/21 122/78

## 2022-05-11 NOTE — ED Triage Notes (Signed)
PT hit his lower Rt leg on a nail a couple of days ago. Pt reports he takes blood thinners.

## 2022-05-13 NOTE — ED Provider Notes (Signed)
[place]   [id number] 05/11/22 Arrival Time: 4970  ASSESSMENT & PLAN:  1. Localized bacterial skin infection   2. Elevated blood pressure reading in office with diagnosis of hypertension    Will go ahead and cover for infection. Meds ordered this encounter  Medications   doxycycline (VIBRAMYCIN) 100 MG capsule    Sig: Take 1 capsule (100 mg total) by mouth 2 (two) times daily.    Dispense:  20 capsule    Refill:  0   Discussed that this wound may take awhile to heal.  Recommend:  Follow-up Information     [name], MD.   Specialty: Internal Medicine Why: If worsening or failing to improve as anticipated. Contact information: [place] [address] [phone]                Reviewed expectations re: course of current medical issues. Questions answered. Outlined signs and symptoms indicating need for more acute intervention. Patient verbalized understanding. After Visit Summary given.   SUBJECTIVE:  Daniel Joseph is a 61 y.o. male who presents with a skin complaint. "Hole in my leg". Right lower; first noted sev d ago. Hit leg on nail. Initial bleeding; controlled. "Just not healing up well; a little red now". Scant drainage. Normal ambulation. No extremity sensation changes or weakness.  No tx PTA.   OBJECTIVE: Vitals:   05/11/22 1845  BP: (!) 169/103  Pulse: 67  Resp: 18  Temp: 97.6 F (36.4 C)  SpO2: 99%    General appearance: alert; no distress HEENT: [place]; AT Neck: supple with FROM Lungs: unlabored Heart: regular Extremities: no edema; moves all extremities normally Skin: warm and dry; *** Psychological: alert and cooperative; normal mood and affect  Allergies  Allergen Reactions   Entresto [Sacubitril-Valsartan] Cough   Benazepril Cough    Past Medical History:  Diagnosis Date   Arthritis    back   Benign essential HTN 01/19/2014   Renal Artery [place] 6/16:  No RAS, No AAA   Cardiomyopathy,  dilated, nonischemic (HCC)    EF 45-50% bye echo 08/2015 and EF 50-55% by echo 10/2017   Chronic combined systolic and diastolic CHF (congestive heart failure) (HCC) 01/21/2014   Echocardiogram 5.2020    Echo 01/2019: EF 40-45, mild LVH, Gr 3 DD (restrictive filling), normal RVSF, MR may be severe, mod TR, 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, PASP 79   Gout    H/O atrial flutter 05/2020   but back to NSR   Mitral valve regurgitation    mild to moderate MR with MVP of ANMVL by echo 2019   PVC's (premature ventricular contractions)    nonsustained VT as well as PVCs - no ICD indicated due to EF 40-45%   Sleep apnea    pending second sleep study    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   Social History   Socioeconomic History   Marital status: Divorced    Spouse name: Not on file   Number of children: Not on file   Years of education: 12   Highest education level: High school graduate  Occupational History   Occupation: Disabled  Tobacco Use   Smoking status: Never   Smokeless tobacco: Never  Vaping Use   Vaping Use: Never used  Substance and Sexual Activity   Alcohol use: Not Currently   Drug use: Never   Sexual activity: Yes  Other Topics Concern   Not on  file  Social History Narrative   Not on file   Social Determinants of Health   Financial Resource Strain: Not on file  Food Insecurity: Not on file  Transportation Needs: Not on file  Physical Activity: Not on file  Stress: Not on file  Social Connections: Not on file  Intimate Partner Violence: Not on file   Family History  Problem Relation Age of Onset   Heart Problems Mother    Stroke Mother    Heart failure Mother    Hypertension Mother    Heart Problems Father    Heart attack Father    Hypertension Father    Heart Problems Brother    Heart attack Brother    Hypertension Brother    Colon cancer Neg Hx    Esophageal cancer Neg Hx    Stomach cancer Neg Hx    Colon  polyps Neg Hx    Rectal cancer Neg Hx    Past Surgical History:  Procedure Laterality Date   ABDOMINAL SURGERY  at birth   BRONCHIAL NEEDLE ASPIRATION BIOPSY  03/29/2021   Procedure: BRONCHIAL NEEDLE [place];  Surgeon: Garner Nash, DO;  Location: [place];  Service: Pulmonary;;   BUBBLE STUDY  03/16/2019   Procedure: BUBBLE STUDY;  Surgeon: Larey Dresser, MD;  Location: [place];  Service: Cardiovascular;;   CARDIAC CATHETERIZATION     CARDIAC VALVE REPLACEMENT     CARDIOVERSION N/A 06/08/2020   Procedure: CARDIOVERSION;  Surgeon: Larey Dresser, MD;  Location: [facility] ENDOSCOPY;  Service: Cardiovascular;  Laterality: N/A;   RIGHT/LEFT HEART CATH AND CORONARY ANGIOGRAPHY N/A 03/16/2019   Procedure: RIGHT/LEFT HEART CATH AND CORONARY ANGIOGRAPHY;  Surgeon: Larey Dresser, MD;  Location: [place] CV LAB;  Service: Cardiovascular;  Laterality: N/A;   RIGHT/LEFT HEART CATH AND CORONARY ANGIOGRAPHY N/A 11/09/2020   Procedure: RIGHT/LEFT HEART CATH AND CORONARY ANGIOGRAPHY;  Surgeon: Larey Dresser, MD;  Location: [place] CV LAB;  Service: Cardiovascular;  Laterality: N/A;   TEE WITHOUT CARDIOVERSION N/A 01/25/2014   Procedure: TRANSESOPHAGEAL ECHOCARDIOGRAM (TEE);  Surgeon: [name], MD;  Location: [place];  Service: Cardiovascular;  Laterality: N/A;   TEE WITHOUT CARDIOVERSION N/A 09/14/2014   Procedure: TRANSESOPHAGEAL ECHOCARDIOGRAM (TEE);  Surgeon: Sueanne Margarita, MD;  Location: [facility] ENDOSCOPY;  Service: Cardiovascular;  Laterality: N/A;   TEE WITHOUT CARDIOVERSION N/A 03/16/2019   Procedure: TRANSESOPHAGEAL ECHOCARDIOGRAM (TEE);  Surgeon: Larey Dresser, MD;  Location: [facility] ENDOSCOPY;  Service: Cardiovascular;  Laterality: N/A;   TEE WITHOUT CARDIOVERSION N/A 06/08/2020   Procedure: TRANSESOPHAGEAL ECHOCARDIOGRAM (TEE);  Surgeon: Larey Dresser, MD;  Location[facility] ENDOSCOPY;  Service: Cardiovascular;  Laterality: N/A;   VIDEO BRONCHOSCOPY WITH  ENDOBRONCHIAL ULTRASOUND N/A 03/29/2021   Procedure: VIDEO BRONCHOSCOPY WITH ENDOBRONCHIAL ULTRASOUND;  Surgeon: Garner Nash, DO;  Location: [place];  Service: Pulmonary;  Laterality: N/A;

## 2022-06-04 ENCOUNTER — Other Ambulatory Visit (HOSPITAL_COMMUNITY): Payer: Self-pay | Admitting: Cardiology

## 2022-06-04 DIAGNOSIS — I4892 Unspecified atrial flutter: Secondary | ICD-10-CM

## 2022-06-10 ENCOUNTER — Ambulatory Visit (INDEPENDENT_AMBULATORY_CARE_PROVIDER_SITE_OTHER): Payer: Medicaid Other | Admitting: Pulmonary Disease

## 2022-06-10 ENCOUNTER — Encounter: Payer: Self-pay | Admitting: Pulmonary Disease

## 2022-06-10 VITALS — BP 140/90 | HR 66 | Ht 69.0 in | Wt 230.6 lb

## 2022-06-10 DIAGNOSIS — D8685 Sarcoid myocarditis: Secondary | ICD-10-CM | POA: Diagnosis not present

## 2022-06-10 DIAGNOSIS — I5042 Chronic combined systolic (congestive) and diastolic (congestive) heart failure: Secondary | ICD-10-CM

## 2022-06-10 DIAGNOSIS — R599 Enlarged lymph nodes, unspecified: Secondary | ICD-10-CM

## 2022-06-10 DIAGNOSIS — R590 Localized enlarged lymph nodes: Secondary | ICD-10-CM

## 2022-06-10 NOTE — Patient Instructions (Signed)
Thank you for visiting Dr. Valeta Harms at [facility]. Today we recommend the following:  Keep appt follow with Dr. [name] and [facility] appts as scheduled   Return if symptoms worsen or fail to improve.    Please do your part to reduce the spread of COVID-19.

## 2022-06-10 NOTE — Progress Notes (Signed)
Synopsis: Referred in June 2022 for abnormal ct imaging by [name], MD  Subjective:   PATIENT ID: Daniel Joseph GENDER: male DOB: 10-Jun-1961, MRN: [id number]  Chief Complaint  Patient presents with   Follow-up    This is a 61 year old gentleman, history of cardiomyopathy, chronic systolic heart failure.  Also has sleep apnea.  Seen by Dr. [name] from the advanced heart failure service.  She had work-up for his cardiac abnormalities to include a cardiac MRI which showed mid left ventricular wall late gadolinium enhancement concerning for sarcoidosis.  Patient ultimately was sent to [facility] for a specialized cardiac PET scan.  This was also concerning for potential sarcoidosis.  He is found to have some small hilar bilateral lymph nodes that were PET avid as well as a small right-sided pretracheal node that had low-level PET uptake.  This concerning for sarcoidosis.  He has not had a tissue diagnosis.  As of this time.  Patient was referred to me for evaluation of bronchoscopy and tissue sampling of the lymph nodes present within the chest to confirm diagnosis of sarcoid.  He additionally is undergoing work-up at [facility] for consideration of valve surgery.  OV 05/09/2021: Here today for follow-up post bronchoscopy as well as post surgery that was completed at [facility].  Overall doing well today.  He has follow-up scheduled soon to see Dr. [name].  From respiratory standpoint he has no significant complaints.  Breathing well.  He is still sore.  Getting out and moving as much as possible.  We reviewed his bronc results.  Of note pathology was negative for any other evidence of sarcoidosis.  We discussed his previous scan in March and next steps. . OV 06/10/2022: Here today for follow-up.  Bronchoscopy negative for active sarcoid.  Was seen by Dr. 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  Underwent mitral valve repair tricuspid valve repair M[place] procedure.  Postop echo showed EF  greater than 55%, moderate LVH normal RV, mild TR PASP of 57 mmHg.  Last saw Dr. Algernon Huxley in heart failure clinic in January 2023.  Right now not on any immune suppression.  From respiratory standpoint patient doing fine.  He has no complaints today.     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, PASP 79   Gout    H/O atrial flutter 05/2020   but back to NSR   Mitral valve regurgitation    mild to moderate MR with MVP of ANMVL by echo 2019   PVC's (premature ventricular contractions)    nonsustained VT as well as PVCs - no ICD indicated due to EF 40-45%   Sleep apnea    pending second sleep study    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     Family History  Problem Relation Age of Onset   Heart Problems Mother    Stroke Mother    Heart failure Mother    Hypertension Mother  Heart Problems Father    Heart attack Father    Hypertension Father    Heart Problems Brother    Heart attack Brother    Hypertension Brother    Colon cancer Neg Hx    Esophageal cancer Neg Hx    Stomach cancer Neg Hx    Colon polyps Neg Hx    Rectal cancer Neg Hx      Past Surgical History:  Procedure Laterality Date   ABDOMINAL SURGERY  at birth   BRONCHIAL NEEDLE ASPIRATION BIOPSY  03/29/2021   Procedure: BRONCHIAL NEEDLE ASPIRATION BIOPSIES;  Surgeon: Garner Nash, DO;  Location: [place] [facility];  Service: Pulmonary;;   BUBBLE STUDY  03/16/2019   Procedure: BUBBLE STUDY;  Surgeon: Larey Dresser, MD;  Location:  [place];  Service: Cardiovascular;;   CARDIAC CATHETERIZATION     CARDIAC VALVE REPLACEMENT     CARDIOVERSION N/A 06/08/2020   Procedure: CARDIOVERSION;  Surgeon: Larey Dresser, MD;  Location: [facility] ENDOSCOPY;  Service: Cardiovascular;  Laterality: N/A;   RIGHT/LEFT HEART CATH AND CORONARY ANGIOGRAPHY N/A 03/16/2019   Procedure: RIGHT/LEFT HEART CATH AND CORONARY ANGIOGRAPHY;  Surgeon: Larey Dresser, MD;  Location: [place] CV LAB;  Service: Cardiovascular;  Laterality: N/A;   RIGHT/LEFT HEART CATH AND CORONARY ANGIOGRAPHY N/A 11/09/2020   Procedure: RIGHT/LEFT HEART CATH AND CORONARY ANGIOGRAPHY;  Surgeon: Larey Dresser, MD;  Location: [place] CV LAB;  Service: Cardiovascular;  Laterality: N/A;   TEE WITHOUT CARDIOVERSION N/A 01/25/2014   Procedure: TRANSESOPHAGEAL ECHOCARDIOGRAM (TEE);  Surgeon: [name], MD;  Location: [place];  Service: Cardiovascular;  Laterality: N/A;   TEE WITHOUT CARDIOVERSION N/A 09/14/2014   Procedure: TRANSESOPHAGEAL ECHOCARDIOGRAM (TEE);  Surgeon: Sueanne Margarita, MD;  Location: [facility] ENDOSCOPY;  Service: Cardiovascular;  Laterality: N/A;   TEE WITHOUT CARDIOVERSION N/A 03/16/2019   Procedure: TRANSESOPHAGEAL ECHOCARDIOGRAM (TEE);  Surgeon: Larey Dresser, MD;  Location: [facility] ENDOSCOPY;  Service: Cardiovascular;  Laterality: N/A;   TEE WITHOUT CARDIOVERSION N/A 06/08/2020   Procedure: TRANSESOPHAGEAL ECHOCARDIOGRAM (TEE);  Surgeon: Larey Dresser, MD;  Location: [facility] ENDOSCOPY;  Service: Cardiovascular;  Laterality: N/A;   VIDEO BRONCHOSCOPY WITH ENDOBRONCHIAL ULTRASOUND N/A 03/29/2021   Procedure: VIDEO BRONCHOSCOPY WITH ENDOBRONCHIAL ULTRASOUND;  Surgeon: Garner Nash, DO;  Location: [place];  Service: Pulmonary;  Laterality: N/A;    Social History   Socioeconomic History   Marital status: Divorced    Spouse name: Not on file   Number of children: Not on file   Years of education: 12   Highest education level: High school  graduate  Occupational History   Occupation: Disabled  Tobacco Use   Smoking status: Never   Smokeless tobacco: Never  Vaping Use   Vaping Use: Never used  Substance and Sexual Activity   Alcohol use: Not Currently   Drug use: Never   Sexual activity: Yes  Other Topics Concern   Not on file  Social History Narrative   Not on file   Social Determinants of Health   Financial Resource Strain: Not on file  Food Insecurity: Not on file  Transportation Needs: Not on file  Physical Activity: Not on file  Stress: Not on file  Social Connections: Not on file  Intimate Partner Violence: Not on file     Allergies  Allergen Reactions   Entresto [Sacubitril-Valsartan] Cough   Benazepril Cough     Outpatient Medications Prior to Visit  Medication Sig Dispense Refill   acetaminophen (TYLENOL) 650 MG CR  tablet Take 1,300 mg by mouth every 8 (eight) hours as needed for pain.     amLODipine (NORVASC) 10 MG tablet TAKE 1 TABLET (10 MG TOTAL) BY MOUTH DAILY WITH LUNCH (NOON) 30 tablet 11   apixaban (ELIQUIS) 5 MG TABS tablet Take 1 tablet (5 mg total) by mouth 2 (two) times daily. Please call for office visit [phone] 60 tablet 3   bisacodyl (DULCOLAX) 5 MG EC tablet Take 1 tablet (5 mg total) by mouth daily as needed for moderate constipation. 4 tablet 0   carvedilol (COREG) 25 MG tablet TAKE 1 & 1/2 TABLETS (37.5 MG TOTAL) BY MOUTH 2 (TWO) TIMES DAILY (AM+EVENING) 270 tablet 3   doxycycline (VIBRAMYCIN) 100 MG capsule Take 1 capsule (100 mg total) by mouth 2 (two) times daily. 20 capsule 0   furosemide (LASIX) 40 MG tablet Take 1 tablet (40 mg total) by mouth every morning AND 0.5 tablets (20 mg total) every evening. 135 tablet 3   hydrALAZINE (APRESOLINE) 50 MG tablet Take 1 tablet (50 mg total) by mouth 3 (three) times daily. Please call for office visit [phone] 270 tablet 3   isosorbide mononitrate (IMDUR) 60 MG 24 hr tablet TAKE 1 & 1/2 TABLET (90 MG TOTAL) BY MOUTH DAILY (AM)  135 tablet 3   losartan (COZAAR) 100 MG tablet TAKE 1 TABLET BY MOUTH IN THE MORNING AND 1/2 TABLET EVERY EVENING. (AM+BEDTIME) 135 tablet 3   Multiple Vitamin (MULTIVITAMIN WITH MINERALS) TABS tablet Take 1 tablet by mouth daily. 30 tablet 0   rosuvastatin (CRESTOR) 10 MG tablet TAKE 1 TABLET (10 MG TOTAL) BY MOUTH DAILY (BEDTIME) 90 tablet 3   spironolactone (ALDACTONE) 50 MG tablet TAKE 1 TABLET (50 MG TOTAL) BY MOUTH DAILY (AM) 90 tablet 3   No facility-administered medications prior to visit.    Review of Systems  Constitutional:  Negative for chills, fever, malaise/fatigue and weight loss.  HENT:  Negative for hearing loss, sore throat and tinnitus.   Eyes:  Negative for blurred vision and double vision.  Respiratory:  Negative for cough, hemoptysis, sputum production, shortness of breath, wheezing and stridor.   Cardiovascular:  Negative for chest pain, palpitations, orthopnea, leg swelling and PND.  Gastrointestinal:  Negative for abdominal pain, constipation, diarrhea, heartburn, nausea and vomiting.  Genitourinary:  Negative for dysuria, hematuria and urgency.  Musculoskeletal:  Negative for joint pain and myalgias.  Skin:  Negative for itching and rash.  Neurological:  Negative for dizziness, tingling, weakness and headaches.  Endo/Heme/Allergies:  Negative for environmental allergies. Does not bruise/bleed easily.  Psychiatric/Behavioral:  Negative for depression. The patient is not nervous/anxious and does not have insomnia.   All other systems reviewed and are negative.    Objective:  Physical Exam Vitals reviewed.  Constitutional:      General: He is not in acute distress.    Appearance: He is well-developed.  HENT:     Head: Normocephalic and atraumatic.  Eyes:     General: No scleral icterus.    Conjunctiva/sclera: Conjunctivae normal.     Pupils: Pupils are equal, round, and reactive to light.  Neck:     Vascular: No JVD.     Trachea: No tracheal deviation.   Cardiovascular:     Rate and Rhythm: Normal rate and regular rhythm.     Heart sounds: Normal heart sounds. No murmur heard. Pulmonary:     Effort: Pulmonary effort is normal. No tachypnea, accessory muscle usage or respiratory distress.     Breath  sounds: No stridor. No wheezing, rhonchi or rales.  Abdominal:     General: There is no distension.     Palpations: Abdomen is soft.     Tenderness: There is no abdominal tenderness.  Musculoskeletal:        General: No tenderness.     Cervical back: Neck supple.  Lymphadenopathy:     Cervical: No cervical adenopathy.  Skin:    General: Skin is warm and dry.     Capillary Refill: Capillary refill takes less than 2 seconds.     Findings: No rash.  Neurological:     Mental Status: He is alert and oriented to person, place, and time.  Psychiatric:        Behavior: Behavior normal.      Vitals:   06/10/22 0939  BP: (!) 140/90  Pulse: 66  SpO2: 96%  Weight: 230 lb 9.6 oz (104.6 kg)  Height: '5\' 9"'$  (1.753 m)   96% on RA BMI Readings from Last 3 Encounters:  06/10/22 34.05 kg/m  09/19/21 32.96 kg/m  08/31/21 32.71 kg/m   Wt Readings from Last 3 Encounters:  06/10/22 230 lb 9.6 oz (104.6 kg)  09/19/21 233 lb (105.7 kg)  08/31/21 231 lb 4.2 oz (104.9 kg)     CBC    Component Value Date/Time   WBC 4.0 05/31/2021 1100   RBC 3.60 (L) 05/31/2021 1100   HGB 11.5 (L) 05/31/2021 1100   HCT 34.7 (L) 05/31/2021 1100   PLT 138 (L) 05/31/2021 1100   MCV 96.4 05/31/2021 1100   MCH 31.9 05/31/2021 1100   MCHC 33.1 05/31/2021 1100   RDW 14.2 05/31/2021 1100   LYMPHSABS 1.2 05/05/2021 1222   MONOABS 0.5 05/05/2021 1222   EOSABS 0.2 05/05/2021 1222   BASOSABS 0.0 05/05/2021 1222     Chest Imaging: CT scan of the chest: 11/21/2020: Small paratracheal lymph node enlarged, small bilateral hilar adenopathy No significant parenchymal changes.  Concerning for sarcoidosis The patient's images have been independently reviewed by me.      Pulmonary Functions Testing Results:     No data to display          FeNO:   Pathology:   Echocardiogram:   Heart Catheterization:     Assessment & Plan:     ICD-10-CM   1. 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 Adenopathy  R59.9     4. Chronic combined systolic and diastolic CHF (congestive heart failure) ([place])  I50.42        Discussion:  This is a 61 year old gentleman, cardiac MRI and cardiac PET imaging concerning for sarcoidosis.  He had valve repair completed at [facility].  Has seen Dr. Weyman Croon and sarcoid clinic at [facility].  Biopsy via endobronchial ultrasound of mediastinal nodes were nondiagnostic for sarcoid.  Felt to be related to burned-out disease.  He is asymptomatic at this point.  Showing no evidence of active disease with plans for follow-up at [facility].  Plan: He is in a keep his follow-up scheduled at [facility] We will be here available for him in [place] if anything changes. Office note from 08/22/2021 [facility] cardiology reviewed. They plan to start immune suppression if there is any evidence of worsening inflammation.  They have plans for a repeat cardiac PET in the future. He did not have his 27-monthfollow-up with them due to a change in insurance.  RTC as needed.     Current Outpatient Medications:  acetaminophen (TYLENOL) 650 MG CR tablet, Take 1,300 mg by mouth every 8 (eight) hours as needed for pain., Disp: , Rfl:    amLODipine (NORVASC) 10 MG tablet, TAKE 1 TABLET (10 MG TOTAL) BY MOUTH DAILY WITH LUNCH (NOON), Disp: 30 tablet, Rfl: 11   apixaban (ELIQUIS) 5 MG TABS tablet, Take 1 tablet (5 mg total) by mouth 2 (two) times daily. Please call for office visit [phone], Disp: 60 tablet, Rfl: 3   bisacodyl (DULCOLAX) 5 MG EC tablet, Take 1 tablet (5 mg total) by mouth daily as needed for moderate constipation., Disp: 4 tablet, Rfl: 0   carvedilol (COREG) 25 MG tablet, TAKE 1 & 1/2 TABLETS (37.5 MG TOTAL) BY MOUTH 2  (TWO) TIMES DAILY (AM+EVENING), Disp: 270 tablet, Rfl: 3   doxycycline (VIBRAMYCIN) 100 MG capsule, Take 1 capsule (100 mg total) by mouth 2 (two) times daily., Disp: 20 capsule, Rfl: 0   furosemide (LASIX) 40 MG tablet, Take 1 tablet (40 mg total) by mouth every morning AND 0.5 tablets (20 mg total) every evening., Disp: 135 tablet, Rfl: 3   hydrALAZINE (APRESOLINE) 50 MG tablet, Take 1 tablet (50 mg total) by mouth 3 (three) times daily. Please call for office visit [phone], Disp: 270 tablet, Rfl: 3   isosorbide mononitrate (IMDUR) 60 MG 24 hr tablet, TAKE 1 & 1/2 TABLET (90 MG TOTAL) BY MOUTH DAILY (AM), Disp: 135 tablet, Rfl: 3   losartan (COZAAR) 100 MG tablet, TAKE 1 TABLET BY MOUTH IN THE MORNING AND 1/2 TABLET EVERY EVENING. (AM+BEDTIME), Disp: 135 tablet, Rfl: 3   Multiple Vitamin (MULTIVITAMIN WITH MINERALS) TABS tablet, Take 1 tablet by mouth daily., Disp: 30 tablet, Rfl: 0   rosuvastatin (CRESTOR) 10 MG tablet, TAKE 1 TABLET (10 MG TOTAL) BY MOUTH DAILY (BEDTIME), Disp: 90 tablet, Rfl: 3   spironolactone (ALDACTONE) 50 MG tablet, TAKE 1 TABLET (50 MG TOTAL) BY MOUTH DAILY (AM), Disp: 90 tablet, Rfl: 3    Garner Nash, DO [facility] 06/10/2022 9:51 AM

## 2022-06-10 NOTE — Progress Notes (Signed)
We will re-order cardiac PET at [facility] as soon as it can be done.    Emer, can you arrange for cardiac PET at [facility] for Mr Kimberley to follow cardiac sarcoidosis? Thanks.

## 2022-06-25 ENCOUNTER — Encounter (HOSPITAL_COMMUNITY): Payer: Self-pay

## 2022-06-25 ENCOUNTER — Ambulatory Visit (HOSPITAL_COMMUNITY)
Admission: RE | Admit: 2022-06-25 | Discharge: 2022-06-25 | Disposition: A | Discharge status: Home / Self Care | Payer: Medicaid Other | Source: Ambulatory Visit | Attending: Family Medicine | Admitting: Family Medicine

## 2022-06-25 VITALS — BP 165/75 | HR 68 | Wt 236.2 lb

## 2022-06-25 DIAGNOSIS — Z9889 Other specified postprocedural states: Secondary | ICD-10-CM

## 2022-06-25 DIAGNOSIS — I2510 Atherosclerotic heart disease of native coronary artery without angina pectoris: Secondary | ICD-10-CM | POA: Diagnosis not present

## 2022-06-25 DIAGNOSIS — I428 Other cardiomyopathies: Secondary | ICD-10-CM | POA: Insufficient documentation

## 2022-06-25 DIAGNOSIS — Z7901 Long term (current) use of anticoagulants: Secondary | ICD-10-CM | POA: Insufficient documentation

## 2022-06-25 DIAGNOSIS — I10 Essential (primary) hypertension: Secondary | ICD-10-CM

## 2022-06-25 DIAGNOSIS — I4891 Unspecified atrial fibrillation: Secondary | ICD-10-CM | POA: Insufficient documentation

## 2022-06-25 DIAGNOSIS — R059 Cough, unspecified: Secondary | ICD-10-CM | POA: Diagnosis not present

## 2022-06-25 DIAGNOSIS — Z79899 Other long term (current) drug therapy: Secondary | ICD-10-CM | POA: Diagnosis not present

## 2022-06-25 DIAGNOSIS — D8689 Sarcoidosis of other sites: Secondary | ICD-10-CM | POA: Diagnosis not present

## 2022-06-25 DIAGNOSIS — D869 Sarcoidosis, unspecified: Secondary | ICD-10-CM | POA: Diagnosis not present

## 2022-06-25 DIAGNOSIS — I5042 Chronic combined systolic (congestive) and diastolic (congestive) heart failure: Secondary | ICD-10-CM

## 2022-06-25 DIAGNOSIS — I130 Hypertensive heart and chronic kidney disease with heart failure and stage 1 through stage 4 chronic kidney disease, or unspecified chronic kidney disease: Secondary | ICD-10-CM | POA: Diagnosis present

## 2022-06-25 DIAGNOSIS — I081 Rheumatic disorders of both mitral and tricuspid valves: Secondary | ICD-10-CM | POA: Insufficient documentation

## 2022-06-25 DIAGNOSIS — R590 Localized enlarged lymph nodes: Secondary | ICD-10-CM | POA: Insufficient documentation

## 2022-06-25 DIAGNOSIS — I4892 Unspecified atrial flutter: Secondary | ICD-10-CM | POA: Diagnosis not present

## 2022-06-25 DIAGNOSIS — I272 Pulmonary hypertension, unspecified: Secondary | ICD-10-CM | POA: Insufficient documentation

## 2022-06-25 DIAGNOSIS — I509 Heart failure, unspecified: Secondary | ICD-10-CM | POA: Insufficient documentation

## 2022-06-25 DIAGNOSIS — N183 Chronic kidney disease, stage 3 unspecified: Secondary | ICD-10-CM | POA: Diagnosis not present

## 2022-06-25 LAB — BASIC METABOLIC PANEL
Anion gap: 5 (ref 5–15)
BUN: 14 mg/dL (ref 6–20)
CO2: 26 mmol/L (ref 22–32)
Calcium: 9 mg/dL (ref 8.9–10.3)
Chloride: 106 mmol/L (ref 98–111)
Creatinine, Ser: 1.36 mg/dL — ABNORMAL HIGH (ref 0.61–1.24)
GFR, Estimated: >60 mL/min (ref >60)
Glucose, Bld: 87 mg/dL (ref 70–99)
Potassium: 4.6 mmol/L (ref 3.5–5.1)
Sodium: 137 mmol/L (ref 135–145)

## 2022-06-25 LAB — CBC
HCT: 38.4 % — ABNORMAL LOW (ref 39.0–52.0)
Hemoglobin: 13.1 g/dL (ref 13.0–17.0)
MCH: 32.3 pg (ref 26.0–34.0)
MCHC: 34.1 g/dL (ref 30.0–36.0)
MCV: 94.8 fL (ref 80.0–100.0)
Platelets: 141 10*3/uL — ABNORMAL LOW (ref 150–400)
RBC: 4.05 MIL/uL — ABNORMAL LOW (ref 4.22–5.81)
RDW: 13.9 % (ref 11.5–15.5)
WBC: 2.9 10*3/uL — ABNORMAL LOW (ref 4.0–10.5)
nRBC: 0 % (ref 0.0–0.2)

## 2022-06-25 LAB — BRAIN NATRIURETIC PEPTIDE: B Natriuretic Peptide: 385.6 pg/mL — ABNORMAL HIGH (ref 0.0–100.0)

## 2022-06-25 NOTE — Progress Notes (Signed)
PCP: [name], MD Cardiology: Dr. [name] HF Cardiology: Dr. [name]  61 y.o. 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 [name] for evaluation of CHF. 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  Cardiolite in 5/16 showed no ischemia but possible apical infarction.  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  PYP scan was read as equivocal in 5/20 but probably not suggestive of transthyretin amyloidosis.   TEE was done in 6/20 to assess severity of MR.  EF was 50% with mild mitral valve prolapse and moderate MR.  RHC/LHC showed nonobstructive CAD, pulmonary venous hypertension, relatively controlled filling pressures. 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   Echo in 3/21 showed EF 55-60%, normal RV, severe LAE, moderate-severe MR.    Patient's BP has been poorly controlled over time.  He has not been consistent with medication compliance.  Renal artery dopplers were unremarkable in 6/16.   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   He had atrial flutter in 9/21 but has been back in NSR.   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  LHC/RHC in 2/22 showed 50% mLAD stenosis, 80% dLAD, prominent v-waves on PCWP tracing. High resolution CT chest in 3/22 showed numerous mediastinal and axillary lymph nodes,  consider sarcoidosis versus lymphoproliferative disease.  Cardiac PET in 4/22 at [facility] showed EF 42%, areas of active inflammation in LV myocardial concerning for cardiac sarcoidosis.  Repeat echo in 5/22 showed EF 50-55%, moderate LV enlargement, low normal RV function, moderate RV enlargement, moderate-severe MR with dilated IVC.   Patient was seen at the sarcoid clinic at [facility] by Dr. Weyman Croon.  PET was reviewed, he was suspected to have "burned out" sarcoid with minimal activity.  Bronchoscopy with biopsy in 7/22 was negative for sarcoidosis.  He was evaluated by cardiac surgery at [facility], and had MV repair/TV repair/Maze in 8/22. Echo post-op in 8/22 showed EF > 55%, moderate LVH, normal RV, s/p MV repair with mean gradient 3 and no MR, s/p TV repair with mean gradient 1 and mild TR, PASP 57 mmHg.   Today he returns for HF follow up. Overall feeling fine. He does not have dyspnea walking up stairs or lifting items. Denies abnormal bleeding, palpitations, CP, dizziness, edema, or PND/Orthopnea. Appetite ok. No fever or chills. Weight at home 230 pounds. Taking all medications. He is not on any immune suppression. BP at home 150/90. Unable to get cardiac PET as insurance will not cover.  ECG (personally reviewed): NSR, LVH 68 bpm  Labs (5/20): K 3.5, creatinine 1.23 Labs (6/20): Urine immunofixation negative, K 4.2, creatinine 1.36 Labs (7/20): creatinine 1.43 Labs (9/20): uric acid 7.5, LDL 67 Labs (1/21): K 3.9, creatinine 1.33, BNP 388, ACE level low Labs (5/21): LDL 57 Labs (9/21): BNP 804, K 4.1, creatinine 1.42 Labs (12/21): K 4.8, creatinine 1.3 Labs (2/22): K 4.7, creatinine 1.07, ACE level 46 (normal) Labs (4/22):  K 4.4, creatinine 1.32 Labs (5/22): LDL 48 Labs (6/22): K 3.9, creatinine 1.22 Labs (8/22): K 4.5, creatinine 1.77 Labs (9/22): hgb 11.5, K 4.5, creatinine 1.72 Labs (7/23): K 4.5, creatinine 1.40, LFTs normal, LDL 47, HDL 54  PMH: 1. Gout 2. H/o NSVT/PVCs: Zio patch 1/22  with rare PVCs, few short NSVT runs.  3. 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  Seen on multiple echoes, first in 2015.  4. HTN: Renal artery dopplers in 6/16 were unremarkable. Sleep study about 2 years ago was negative per patient's report.  5. Mitral regurgitation: TEE in 2015 with mild to moderate MR.  - Echo (2/20): Moderate MR.  - Echo (5/20): Possible severe MR - TEE (6/20): Mild mitral valve prolapse with moderate MR.  - Echo (3/21): Moderate to severe MR.  - TEE (9/21): moderate-severe MR with prolapse of A2 (eccentric MR).  - Cardiac MRI (1/22) with severe MR (regurgitant fraction 54%, regurgitant volume 83 cc).  - Echo (5/22): moderate-severe MR with prolapse A2 - S/p MV repair at [facility] in 8/22.  Post-op echo in 8/22 with MV repair, mean gradient 3 mmHg, no MR.  6. 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  - Cardiolite (5/16): Possible apical infarct, no ischemia.  - Echo (7/19): EF 50-55%.  - Echo (2/20): EF 50-55%, moderate MR, TV fibroelastoma.  - PYP scan (5/20): Grade 1 visually, H/CL 1.23.  Read as equivocal but likely negative for TTR amyloidosis.  - Echo (5/20): EF 40-45%, mildly dilated LV with mild LVH, normal RV size and systolic function, possible severe MR, moderate TR, 1 cm nodule on TV may be fibroelastoma, PASP 79 mmHg.  - TEE (6/20): EF 50%, mild LV dilation, mild LVH, moderate LAE, tricuspid valve mass likely fibroelastoma, mild MVP with moderate MR.  - LHC/RHC (6/20): 50% LAD, 30% pRCA; mean RA 4, PA 55/21 mean 34, mean PCWP 17, CI 2.66, PVR 2.8 WU.  - Cardiac MRI (8/20): EF 53%, mild LVH, inferoseptal/inferior mid-wall LGE (?cardiac sarcoidosis, does not look like amyloidosis), mild RV dilation with RVEF 54%, moderate eccentric MR, severe LAE. - Echo (3/21):  EF 55-60%, normal RV, severe LAE, moderate-severe MR. - TEE (9/21): EF 50%, mild LVH, normal RV, moderate TR with possible TV  fibroelastoma (stable), moderate-severe MR with prolapse of A2 (eccentric MR).  - Cardiac MRI (1/22): moderate LV dilation with EF 45%, diffuse hypokinesis worse in the basal-mid inferior and inferoseptal walls, moderate RV dilation with EF 54%, severe MR with regurgitant fraction 54% and regurgitant volume 83 cc, mid-wall LGE in the basal inferoseptal and inferior walls concerning for possible cardiac sarcoidosis.  - RHC/LHC (2/22): 50% mid LAD, 80% dLAD; mean RA 6, PA 61/22 mean 39, mean PCWP 26 with v waves to 45, CI 2.69, PVR 2.13 WU.  - Cardiac PET: 4/22 at [facility] showed EF 42%, areas of active inflammation in LV myocardial concerning for cardiac sarcoidosis. - Bronchoscopy with biopsy in 7/22 did not show sarcoidosis.  - Echo (5/22): EF 50-55%, moderate LV enlargement, low normal RV function, moderate RV enlargement, moderate-severe MR with dilated IVC.  - Echo (8/22, post-MV/TV repair): EF > 55%, moderate LVH, normal RV, s/p MV repair with mean gradient 3 and no MR, s/p TV repair with mean gradient 1 and mild TR, PASP 57 mmHg. 7. Atrial flutter/fibrillation: Noted first in 9/21.  - S/p Maze in 8/22 with MV/TV surgery.  8. CAD: LHC (2/22) with 50% mid LAD,  80% distal LAD stenoses.  Medical management.  9. CT chest (3/22): Numerous mediastinal and axillary lymph nodes, some of which are enlarged. Findings can be seen with sarcoid or a lymphoproliferative disorder. 10. Tricuspid regurgitation: TV repair in 8/22 with MV repair, mean gradient 1 mmHg with mild TR.  11. CKD stage 3  FH:  Father with MI at 34, brother with MI at 51, mother with CHF, MI.  HTN in multiple family members.   Social History   Socioeconomic History   Marital status: Divorced    Spouse name: Not on file   Number of children: Not on file   Years of education: 12   Highest education level: High school graduate  Occupational History   Occupation: Disabled  Tobacco Use   Smoking status: Never   Smokeless tobacco:  Never  Vaping Use   Vaping Use: Never used  Substance and Sexual Activity   Alcohol use: Not Currently   Drug use: Never   Sexual activity: Yes  Other Topics Concern   Not on file  Social History Narrative   Not on file   Social Determinants of Health   Financial Resource Strain: Not on file  Food Insecurity: Not on file  Transportation Needs: Not on file  Physical Activity: Not on file  Stress: Not on file  Social Connections: Not on file  Intimate Partner Violence: Not on file   ROS: All systems reviewed and negative except as per HPI.   Current Outpatient Medications  Medication Sig Dispense Refill   acetaminophen (TYLENOL) 650 MG CR tablet Take 1,300 mg by mouth every 8 (eight) hours as needed for pain.     amLODipine (NORVASC) 10 MG tablet TAKE 1 TABLET (10 MG TOTAL) BY MOUTH DAILY WITH LUNCH (NOON) 30 tablet 11   apixaban (ELIQUIS) 5 MG TABS tablet Take 1 tablet (5 mg total) by mouth 2 (two) times daily. Please call for office visit [phone] 60 tablet 3   bisacodyl (DULCOLAX) 5 MG EC tablet Take 1 tablet (5 mg total) by mouth daily as needed for moderate constipation. 4 tablet 0   carvedilol (COREG) 25 MG tablet TAKE 1 & 1/2 TABLETS (37.5 MG TOTAL) BY MOUTH 2 (TWO) TIMES DAILY (AM+EVENING) 270 tablet 3   doxycycline (VIBRAMYCIN) 100 MG capsule Take 1 capsule (100 mg total) by mouth 2 (two) times daily. 20 capsule 0   furosemide (LASIX) 40 MG tablet Take 1 tablet (40 mg total) by mouth every morning AND 0.5 tablets (20 mg total) every evening. 135 tablet 3   hydrALAZINE (APRESOLINE) 50 MG tablet Take 1 tablet (50 mg total) by mouth 3 (three) times daily. Please call for office visit [phone] 270 tablet 3   isosorbide mononitrate (IMDUR) 60 MG 24 hr tablet TAKE 1 & 1/2 TABLET (90 MG TOTAL) BY MOUTH DAILY (AM) 135 tablet 3   losartan (COZAAR) 100 MG tablet TAKE 1 TABLET BY MOUTH IN THE MORNING AND 1/2 TABLET EVERY EVENING. (AM+BEDTIME) 135 tablet 3   Multiple Vitamin  (MULTIVITAMIN WITH MINERALS) TABS tablet Take 1 tablet by mouth daily. 30 tablet 0   rosuvastatin (CRESTOR) 10 MG tablet TAKE 1 TABLET (10 MG TOTAL) BY MOUTH DAILY (BEDTIME) 90 tablet 3   spironolactone (ALDACTONE) 50 MG tablet TAKE 1 TABLET (50 MG TOTAL) BY MOUTH DAILY (AM) 90 tablet 3   No current facility-administered medications for this encounter.   Wt Readings from Last 3 Encounters:  06/25/22 107.1 kg (236 lb 3.2 oz)  06/10/22 104.6  kg (230 lb 9.6 oz)  09/19/21 105.7 kg (233 lb)   BP (!) 165/75 (BP Location: Right Arm)   Pulse 68   Wt 107.1 kg (236 lb 3.2 oz)   SpO2 100%   BMI 34.88 kg/m  Physical Exam General:  NAD. No resp difficulty HEENT: Normal Neck: Supple. No JVD. Carotids 2+ bilat; no bruits. No lymphadenopathy or thryomegaly appreciated. Cor: PMI nondisplaced. Regular rate & rhythm. No rubs, gallops or murmurs. Lungs: Clear Abdomen: Soft, nontender, nondistended. No hepatosplenomegaly. No bruits or masses. Good bowel sounds. Extremities: No cyanosis, clubbing, rash, edema Neuro: Alert & oriented x 3, cranial nerves grossly intact. Moves all 4 extremities w/o difficulty. Affect pleasant.  Assessment/Plan: 1. HTN: Negative renal artery dopplers and sleep study in past.  On multiple agents, BP elevated today but generally controlled. No medication change today. - Continue losartan 100 qam/50 qpm (unable to tolerate Entresto due to cough)     - Continue amlodipine.  - Continue hydralazine 50 mg tid.  - Continue spironolactone 50 mg daily.  - Continue Coreg 37.5 mg bid.  - I asked him to continue to check BP at home and log. 2. 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  Echo in 5/20 showed EF 40-45% with normal RV, possible severe mitral regurgitation, and severe pulmonary hypertension.  PYP scan in 5/20 was read as equivocal, but I suspect this is not suggestive of transthyretin amyloidosis. TEE in 5/20 showed EF  50%, mild LV dilation, moderate MR.  RHC/LHC showed mildly elevated PCWP, pulmonary venous hypertension, and nonobstructive CAD.  Cardiac MRI in 8/20 showed EF 53% and had an LGE pattern that was suggestive of possible cardiac sarcoidosis (not amyloidosis).  Echo in 3/21 showed EF up to 55-60%.  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  Cardiac MRI in 1/22 with moderate LV dilation with EF 45%, diffuse hypokinesis worse in the basal-mid inferior and inferoseptal walls, moderate RV dilation with EF 54%, severe MR with regurgitant fraction 54% and regurgitant volume 83 cc, mid-wall LGE in the basal inferoseptal and inferior walls concerning for possible cardiac sarcoidosis. LHC/RHC showed moderate CAD without interventional target, elevated PCWP with prominent v-waves.  Cardiac PET in 4/22 concerning for cardiac amyloidosis.  Bronchoscopy with biopsy in 7/22 was negative for sarcoidosis.  Most recent echo was post-MV/TV, showing EF > 55%, moderate LVH, normal RV, s/p MV repair with mean gradient 3 and no MR, s/p TV repair with mean gradient 1 and mild TR, PASP 57 mmHg.  He is not volume overloaded on exam, NYHA class I.  - Continue Lasix 40 qam/20 qpm.  BMET and BNP today. - Continue losartan 100 qam/50 qpm (does not tolerate Entresto).   - Continue Coreg 37.5 mg bid as above.  - Continue spironolactone 50 mg daily.  - Continue hydralazine 50 mg tid and Imdur 90 mg daily.  - Strong concern for cardiac sarcoidosis based on 4/22 cPET at [facility] and cMRI done here.  High resolution CT chest from 3/22 shows enlarged mediastinal and axillary lymph nodes (could be seen in sarcoidosis versus lymphoproliferative disease).  However, bronchoscopy with biopsy was negative for sarcoidosis in 7/22.  Patient will need repeat cardiac PET (will order at [facility] again) to see if inflammation consistent with sarcoidosis  has changed.  If worsened, may need immunosuppression.  If not, will continue current management without  immunosuppression. However, with his recent change in insurance, cardiac PET is not covered. 3. Mitral regurgitation: ?severe on 5/20 TTE but TEE in 6/20 showed mild mitral valve prolapse with only moderate MR.  Cardiac MRI in 8/20 also suggested moderate MR. TTE in 3/21 suggested moderate-severe MR. TEE in 9/21 with moderate-severe MR, A2 prolapse.  Repeat cardiac MRI in 1/22 clearly showed severe MR with regurgitant fraction 54% and regurgitant volume 83 cc.  In 8/22, patient had MV and TV repairs with Maze.  Post-op echo showed stable repaired MV in 8/22.  - Will need endocarditis prophylaxis with dental work.  - Now finished with Cardiac Rehab.  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  5. CAD: Moderate CAD without interventional target on 2/22 cath, no chest pain.  - Continue Crestor, good lipids in 7/23 - He is on apixaban, so no ASA.  6. Atrial flutter/fibrillation: S/p Maze with MV/TV repairs.  In NSR today.    - Continue Eliquis 5 mg bid. CBC today. - He is now off amiodarone.   7. Cardiac sarcoidosis: As above, strong suspicion for cardiac sarcoidosis based on cPET and cMRI.  He has concerning chest lymphadenopathy as well.  We do not yet have a tissue diagnosis.  Bronchoscopy with biopsy in 7/22 was negative for sarcoidosis.  He was seen by Dr. Weyman Croon at the [facility] cardiac sarcoidosis clinic.  The PET was reviewed, and it was thought that sarcoid is not particularly active (possible "burned out" sarcoidosis).   - As above, ideally would repeat PET. If inflammation has increased, would plan immunosuppression.  If inflammation is stable/decreased, no treatment. Will discuss next steps with Dr. [name].  Followup with Dr. [name] in 3 months.   Maricela Bo [facility] FNP-BC 06/25/2022

## 2022-06-25 NOTE — Patient Instructions (Signed)
It was great to see you today! No medication changes are needed at this time.   Labs today We will only contact you if something comes back abnormal or we need to make some changes. Otherwise no news is good news!  Your physician wants you to follow-up in: 3 months with Dr [name]. You will receive a reminder letter in the mail two months in advance. If you don't receive a letter, please call our office to schedule the follow-up appointment.   Do the following things EVERYDAY: Weigh yourself in the morning before breakfast. Write it down and keep it in a log. Take your medicines as prescribed Eat low salt foods--Limit salt (sodium) to 2000 mg per day.  Stay as active as you can everyday Limit all fluids for the day to less than 2 liters  At the [facility], you and your health needs are our priority. As part of our continuing mission to provide you with exceptional heart care, we have created designated Provider Care Teams. These Care Teams include your primary Cardiologist (physician) and Advanced Practice Providers (APPs- Physician Assistants and Nurse Practitioners) who all work together to provide you with the care you need, when you need it.   You may see any of the following providers on your designated Care Team at your next follow up: Dr Glori Bickers Dr Loralie Champagne Dr. Roxana Hires, NP Lyda Jester, [place] [facility] [place], [place] Forestine Na, NP Audry Riles, PharmD   Please be sure to bring in all your medications bottles to every appointment.   If you have any questions or concerns before your next appointment please send [place] a message through [place] or call our office at [phone].    TO LEAVE A MESSAGE FOR THE NURSE SELECT OPTION 2, PLEASE LEAVE A MESSAGE INCLUDING: YOUR NAME DATE OF BIRTH CALL BACK NUMBER REASON FOR CALL**this is important as we prioritize the call backs  YOU WILL RECEIVE A CALL BACK THE SAME  DAY AS LONG AS YOU CALL BEFORE 4:00 PM

## 2022-08-03 IMAGING — CR DG FOOT COMPLETE 3+V*R*
3 series · 3 of 3 positions shown · non-contrast
Comparison: None.

CLINICAL DATA: Pain

EXAM:
RIGHT FOOT COMPLETE - 3+ VIEW

[t foot ap right]
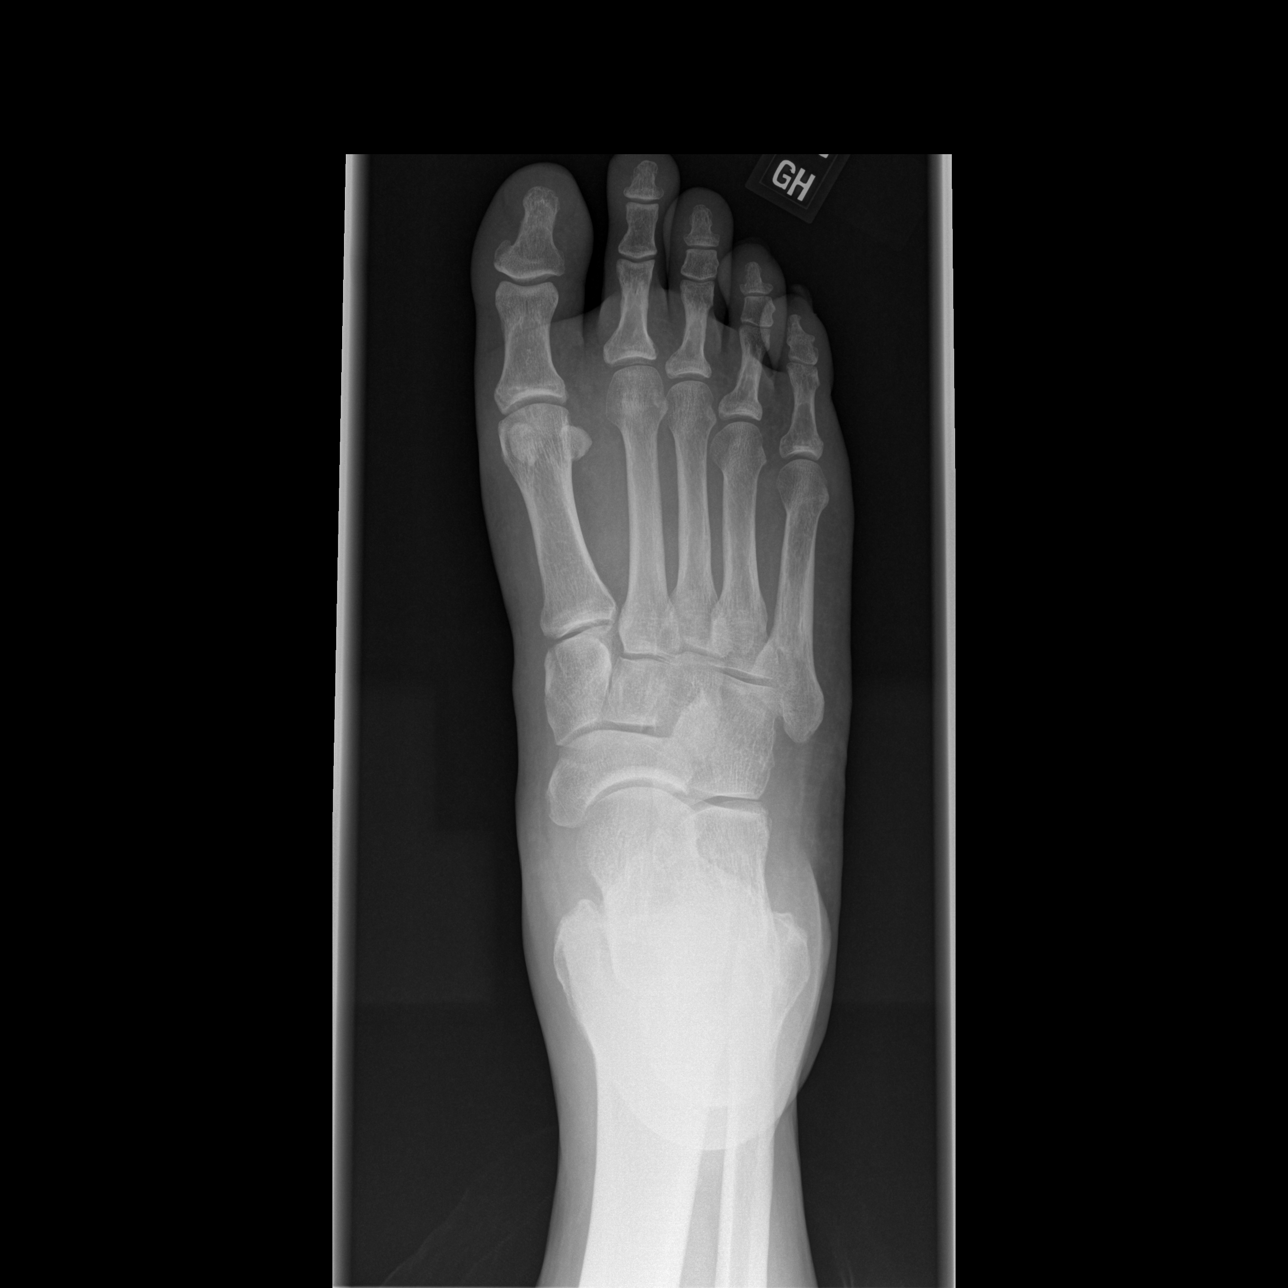

[t foot oblique right]
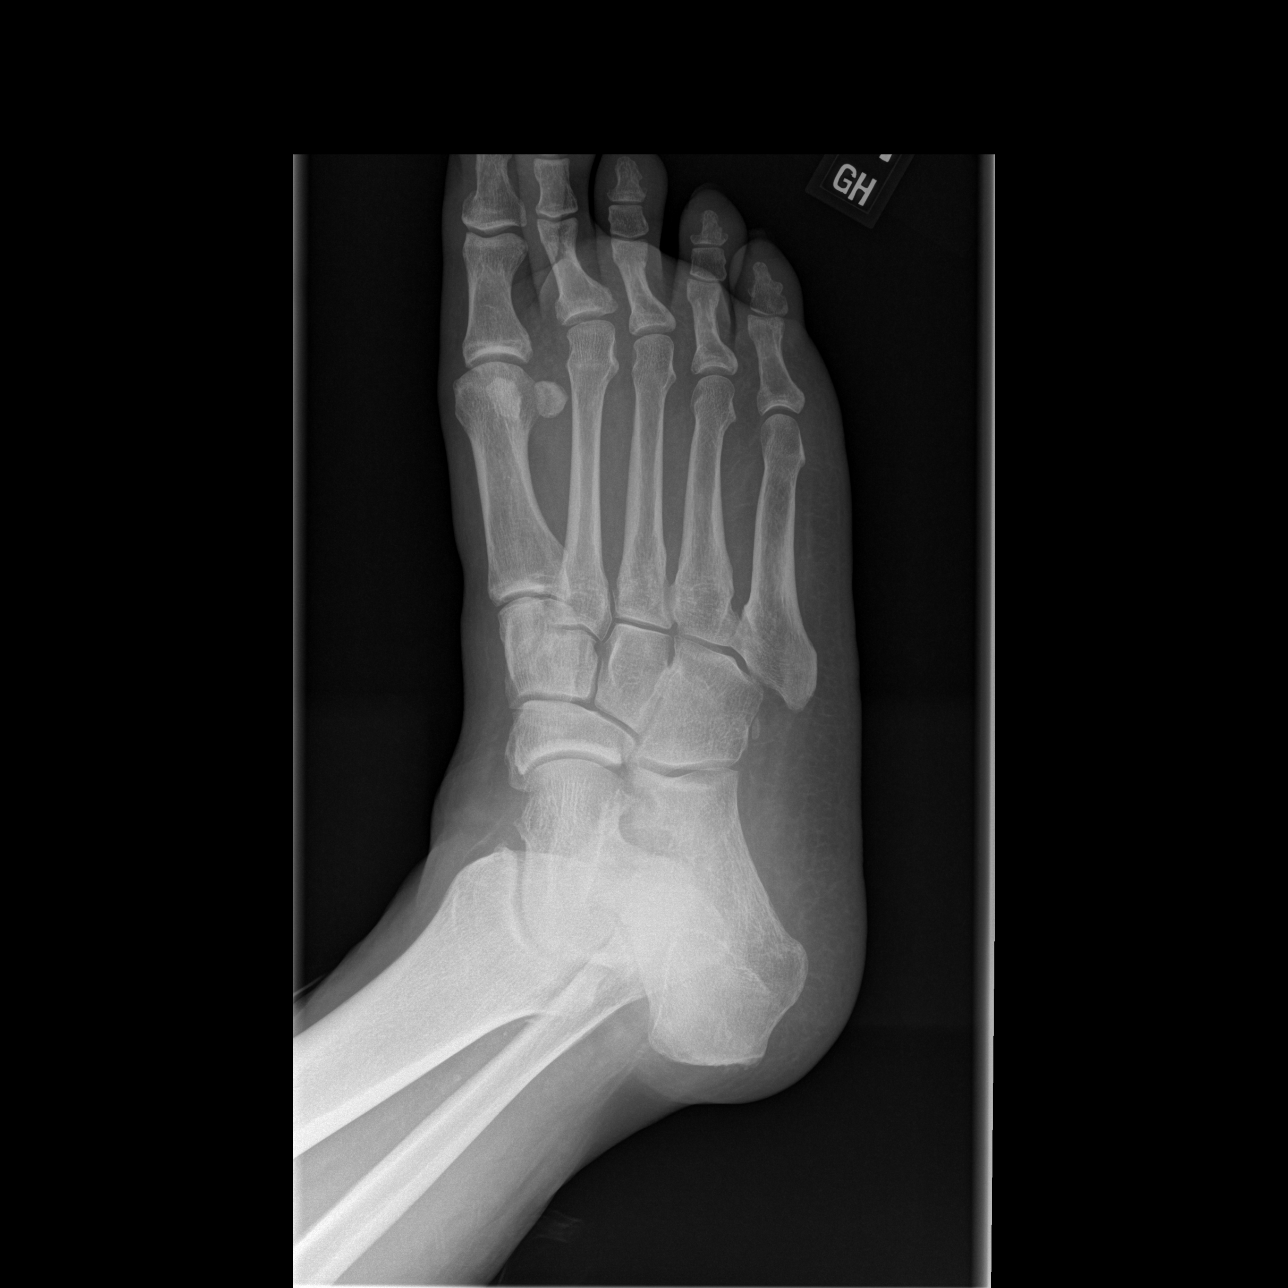

[t foot lat right]
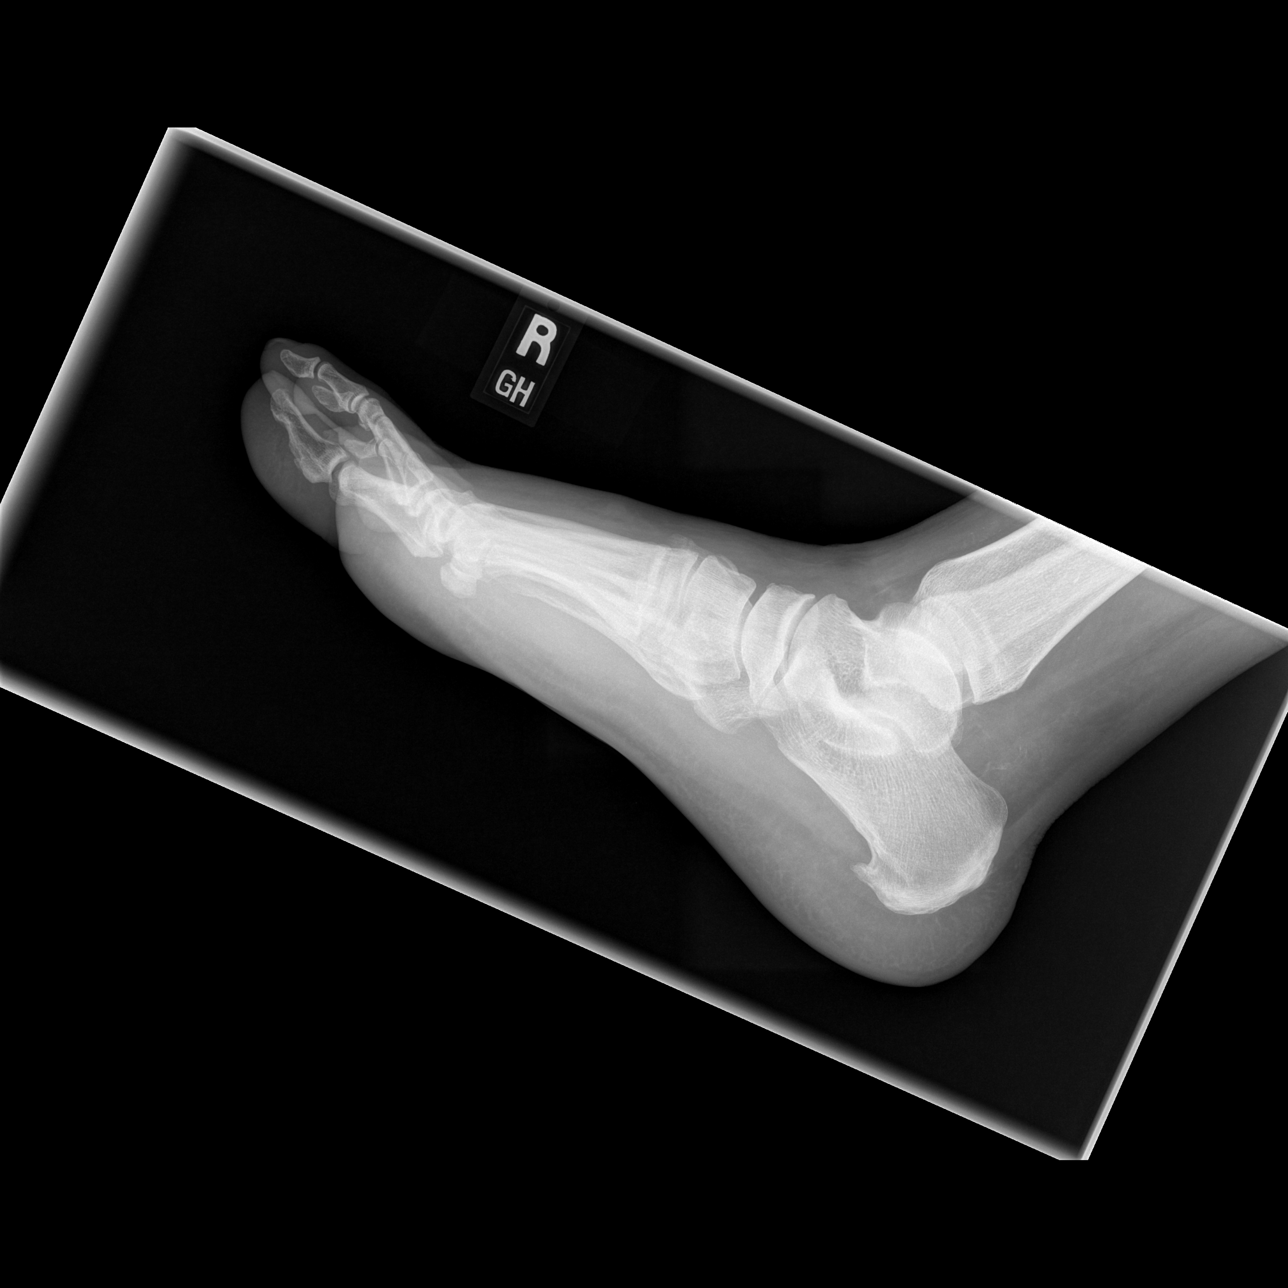

[3 of 3 positions shown; findings below may reference images not displayed]

FINDINGS: No fracture or dislocation is seen. There are no focal lytic
lesions. Small plantar spur is seen in calcaneus.
IMPRESSION: No fracture or dislocation is seen. Plantar spur is seen in
calcaneus.

## 2022-08-26 ENCOUNTER — Other Ambulatory Visit (HOSPITAL_COMMUNITY): Payer: Self-pay | Admitting: Cardiology

## 2022-10-08 ENCOUNTER — Other Ambulatory Visit (HOSPITAL_COMMUNITY): Payer: Self-pay | Admitting: Cardiology

## 2022-10-21 ENCOUNTER — Encounter (HOSPITAL_COMMUNITY): Payer: Self-pay | Admitting: Emergency Medicine

## 2022-10-21 ENCOUNTER — Emergency Department (HOSPITAL_COMMUNITY)
Admission: EM | Admit: 2022-10-21 | Discharge: 2022-10-21 | Disposition: A | Discharge status: Home / Self Care | Payer: Medicaid Other | Attending: Emergency Medicine | Admitting: Emergency Medicine

## 2022-10-21 DIAGNOSIS — Y93G1 Activity, food preparation and clean up: Secondary | ICD-10-CM | POA: Diagnosis not present

## 2022-10-21 DIAGNOSIS — W260XXA Contact with knife, initial encounter: Secondary | ICD-10-CM | POA: Insufficient documentation

## 2022-10-21 DIAGNOSIS — Y9289 Other specified places as the place of occurrence of the external cause: Secondary | ICD-10-CM | POA: Insufficient documentation

## 2022-10-21 DIAGNOSIS — S61311A Laceration without foreign body of left index finger with damage to nail, initial encounter: Secondary | ICD-10-CM | POA: Diagnosis present

## 2022-10-21 DIAGNOSIS — Y998 Other external cause status: Secondary | ICD-10-CM | POA: Insufficient documentation

## 2022-10-21 NOTE — ED Provider Notes (Signed)
[facility] [place] Provider Note   CSN: [id number] Arrival date & time: 10/21/22  1008     History Mitral valve regurgitation Chief Complaint  Patient presents with   Finger Injury    Daniel Joseph is a 62 y.o. male.  62 year old male with a past medical history of mitral valve regurgitation, cardiomyopathy currently on Eliquis presents to the ED status post laceration to his left index finger.  Patient reports he was making himself breakfast when suddenly he took a knife and cut the piece of his finger off along with part of his nail.  He has not taken any medication for improvement in symptoms.  He did have a tetanus immunization last year.  He denies any other injury or complaint at this time.  The history is provided by the patient.       Home Medications Prior to Admission medications   Medication Sig Start Date End Date Taking? Authorizing Provider  acetaminophen (TYLENOL) 650 MG CR tablet Take 1,300 mg by mouth every 8 (eight) hours as needed for pain.    [provider]  amLODipine (NORVASC) 10 MG tablet TAKE 1 TABLET (10 MG TOTAL) BY MOUTH DAILY WITH LUNCH (NOON) 12/05/21   Larey Dresser, MD  apixaban (ELIQUIS) 5 MG TABS tablet Take 1 tablet (5 mg total) by mouth 2 (two) times daily. Please call for office visit [phone] 06/05/22   Larey Dresser, MD  bisacodyl (DULCOLAX) 5 MG EC tablet Take 1 tablet (5 mg total) by mouth daily as needed for moderate constipation. 03/23/19   Nelida Meuse III, MD  carvedilol (COREG) 25 MG tablet TAKE 1 & 1/2 TABLETS (37.5 MG TOTAL) BY MOUTH 2 (TWO) TIMES DAILY (AM+EVENING) 01/30/22   Larey Dresser, MD  doxycycline (VIBRAMYCIN) 100 MG capsule Take 1 capsule (100 mg total) by mouth 2 (two) times daily. 05/11/22   Vanessa Kick, MD  furosemide (LASIX) 40 MG tablet TAKE 1 TABLET (40 MG TOTAL) BY MOUTH EVERY MORNING AND 1/2 TABLET (20 MG TOTAL) EVERY EVENING. 08/26/22   Larey Dresser, MD   hydrALAZINE (APRESOLINE) 50 MG tablet Take 1 tablet (50 mg total) by mouth 3 (three) times daily. Please call for office visit [phone] 06/05/22   Larey Dresser, MD  isosorbide mononitrate (IMDUR) 60 MG 24 hr tablet TAKE 1 & 1/2 TABLET (90 MG TOTAL) BY MOUTH DAILY (AM) 10/08/22   Larey Dresser, MD  losartan (COZAAR) 100 MG tablet TAKE 1 TABLET BY MOUTH IN THE MORNING AND 1/2 TABLET EVERY EVENING. (AM+BEDTIME) 12/05/21   Larey Dresser, MD  Multiple Vitamin (MULTIVITAMIN WITH MINERALS) TABS tablet Take 1 tablet by mouth daily. 09/02/19   Sheth, Vickii Chafe, MD  rosuvastatin (CRESTOR) 10 MG tablet TAKE 1 TABLET (10 MG TOTAL) BY MOUTH DAILY (BEDTIME) 03/07/21   Larey Dresser, MD  spironolactone (ALDACTONE) 50 MG tablet TAKE 1 TABLET (50 MG TOTAL) BY MOUTH DAILY (AM) 10/08/22   Larey Dresser, MD      Allergies    Entresto [sacubitril-valsartan] and Benazepril    Review of Systems   Review of Systems  Constitutional:  Negative for fever.  Skin:  Positive for wound.    Physical Exam Updated Vital Signs BP (!) 181/112 (BP Location: Right Arm)   Pulse 64   Temp 98.1 F (36.7 C) (Oral)   Resp 16   Ht '5\' 8"'$  (1.727 m)   Wt 103 kg   SpO2 100%  BMI 34.52 kg/m  Physical Exam Vitals and nursing note reviewed.  Constitutional:      Appearance: Normal appearance.  HENT:     Head: Normocephalic and atraumatic.  Eyes:     Pupils: Pupils are equal, round, and reactive to light.  Cardiovascular:     Rate and Rhythm: Normal rate.  Pulmonary:     Effort: Pulmonary effort is normal.  Abdominal:     General: Abdomen is flat.  Musculoskeletal:     Left hand: Laceration and tenderness present. No deformity. Normal strength. Normal sensation. There is no disruption of two-point discrimination. Normal capillary refill. Normal pulse.     Cervical back: Normal range of motion and neck supple.     Comments: Large laceration   Skin:    General: Skin is warm and dry.  Neurological:      Mental Status: He is alert and oriented to person, place, and time.        ED Results / Procedures / Treatments   Labs (all labs ordered are listed, but only abnormal results are displayed) Labs Reviewed - No data to display  EKG None  Radiology No results found.  Procedures Procedures    Medications Ordered in ED Medications - No data to display  ED Course/ Medical Decision Making/ A&P                             Medical Decision Making   Patient presents to the ED status post laceration with a kitchen knife.  Reports he was cutting food in order to make himself breakfast.  He is anticoagulated currently on Eliquis due to a prior history of mitral valve stenosis.  Reassuring exam, neurovascularly intact with a large laceration noted.  Patient had Surgifoam applied by me along with a pressure dressing in order to help contain the bleeding.  He is on Eliquis, compliant with the medication.  Exam remains benign. Pressure dressing applied by me for a second time after monitoring patient for approximately 1 hour.  Bleeding has continued to decrease, new Surgifoam applied as there is no Surgicel in the department at this time.  His vitals are stable, he is without any dizziness or lightheadedness.  We discussed return to the emergency department if symptoms do not improve.  Patient is hemodynamically stable for discharge.   Portions of this note were generated with [employer]. Dictation errors may occur despite best attempts at proofreading.  Final Clinical Impression(s) / ED Diagnoses Final diagnoses:  Laceration of left index finger without foreign body with damage to nail, initial encounter    Rx / DC Orders ED Discharge Orders     None         Janeece Fitting, PA-C 10/21/22 1218    Malvin Johns, MD 10/21/22 [phone]

## 2022-10-21 NOTE — ED Triage Notes (Signed)
Pt arrives via [facility] after cutting left index finger with knife while cutting meat. Pt takes eliquis. Bleeding controlled at this time. Last tetanus shot within a year ago.

## 2022-10-25 ENCOUNTER — Inpatient Hospital Stay: Payer: Self-pay | Admitting: Nurse Practitioner

## 2022-11-11 ENCOUNTER — Other Ambulatory Visit (HOSPITAL_COMMUNITY): Payer: Self-pay | Admitting: Cardiology

## 2022-11-11 DIAGNOSIS — I4892 Unspecified atrial flutter: Secondary | ICD-10-CM

## 2023-01-16 ENCOUNTER — Other Ambulatory Visit (HOSPITAL_COMMUNITY): Payer: Self-pay | Admitting: Cardiology

## 2023-02-10 ENCOUNTER — Ambulatory Visit (HOSPITAL_COMMUNITY)
Admission: EM | Admit: 2023-02-10 | Discharge: 2023-02-10 | Disposition: A | Discharge status: Home / Self Care | Payer: Medicaid Other | Attending: Physician Assistant | Admitting: Physician Assistant

## 2023-02-10 ENCOUNTER — Encounter (HOSPITAL_COMMUNITY): Payer: Self-pay | Admitting: *Deleted

## 2023-02-10 ENCOUNTER — Other Ambulatory Visit: Payer: Self-pay

## 2023-02-10 DIAGNOSIS — M10372 Gout due to renal impairment, left ankle and foot: Secondary | ICD-10-CM

## 2023-02-10 DIAGNOSIS — I10 Essential (primary) hypertension: Secondary | ICD-10-CM | POA: Diagnosis not present

## 2023-02-10 DIAGNOSIS — M109 Gout, unspecified: Secondary | ICD-10-CM | POA: Diagnosis not present

## 2023-02-10 MED ORDER — PREDNISONE 10 MG (21) PO TBPK: ORAL_TABLET | ORAL | 0 refills | Status: DC

## 2023-02-10 NOTE — ED Triage Notes (Signed)
Pt reports Lt foot pain at base of toes For 4 days. Skin is red and swollen. Pt reports he has Gout and takes Meds .

## 2023-02-10 NOTE — Discharge Instructions (Signed)
We are treating you for gout.  Start prednisone taper as prescribed.  Do not take NSAIDs including aspirin, ibuprofen/Advil, naproxen/Aleve.  You can use Tylenol for breakthrough pain.  Keep your foot elevated and avoid activity until it improves.  If your symptoms or not improving please follow-up with podiatry; call to schedule an appointment.  If anything changes or worsens and you have increasing pain, spread of redness, fever, nausea, vomiting you need to be seen immediately.  Your blood pressure is elevated.  Monitor this at home if it is persistently above 140/90 you should be reevaluated.  If you develop any chest pain, shortness of breath, headache, vision change, dizziness in the setting of high blood pressure you need to be seen immediately.

## 2023-02-10 NOTE — ED Provider Notes (Addendum)
[facility]    CSN: [id number] Arrival date & time: 02/10/23  5284      History   Chief Complaint Chief Complaint  Patient presents with   Foot Pain    HPI Daniel Joseph is a 62 y.o. male.   Patient presents today with a 4-day history of erythema, swelling, pain involving his distal left foot.  He does have a history of gout and is currently being managed with allopurinol daily.  He does report that just prior to symptoms beginning he had dietary indiscretion and started eating more fish and believes this might of triggered it.  Denies any increased alcohol consumption or medication changes.  He is unable to take NSAIDs as he is chronically anticoagulated with Eliquis.  Has been taking Tylenol without improvement.  He denies any fever, nausea, vomiting, rapid expansion of erythema.  Denies any injury or recent trauma involving the foot.  Pain is rated 10 on a 0-10 pain scale, described as throbbing/aching, no aggravating alleviating factors identified.  Reports current symptoms are similar to previous episodes of gout.  Blood pressure is elevated today likely related to pain.  Denies any recent NSAID use, decongestants, caffeine, sodium.  Denies any associated chest pain, shortness of breath, headache, vision change.  He is compliant with his antihypertensive regimen.     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, PASP 79   Gout    H/O atrial flutter 05/2020   but back to NSR   Mitral valve  regurgitation    mild to moderate MR with MVP of ANMVL by echo 2019   PVC's (premature ventricular contractions)    nonsustained VT as well as PVCs - no ICD indicated due to EF 40-45%   Sleep apnea    pending second sleep study    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    Patient Active Problem List   Diagnosis Date Noted   Adenopathy 03/14/2021   Neuropathy 02/02/2020   Nausea 02/02/2020   Acute exacerbation of CHF (congestive heart failure) (HCC) 08/29/2019   Alcohol abuse 08/29/2019   Hypoxic Respiratory failure, acute (HCC) 08/29/2019   PVC's (premature ventricular contractions)    HTN (hypertension) 03/02/2014   Chronic combined systolic and diastolic CHF (congestive heart failure) (HCC) 03/02/2014   Hypertensive cardiomyopathy (HCC) 03/02/2014   CKD (chronic kidney disease), stage III (HCC) 02/15/2014   Neck nodule 02/01/2014   Eye redness 02/01/2014   Mitral valve regurgitation 01/21/2014   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    Past Surgical History:  Procedure Laterality Date   ABDOMINAL SURGERY  at birth   BRONCHIAL NEEDLE ASPIRATION BIOPSY  03/29/2021   Procedure: BRONCHIAL NEEDLE ASPIRATION BIOPSIES;  Surgeon: Josephine Igo, DO;  Location: [facility];  Service: Pulmonary;;   BUBBLE STUDY  03/16/2019   Procedure: BUBBLE STUDY;  Surgeon: Laurey Morale, MD;  Location: [facility] ENDOSCOPY;  Service: Cardiovascular;;   CARDIAC CATHETERIZATION     CARDIAC  VALVE REPLACEMENT     CARDIOVERSION N/A 06/08/2020   Procedure: CARDIOVERSION;  Surgeon: Laurey Morale, MD;  Location: [facility] ENDOSCOPY;  Service: Cardiovascular;  Laterality: N/A;   RIGHT/LEFT HEART CATH AND CORONARY ANGIOGRAPHY N/A 03/16/2019   Procedure: RIGHT/LEFT HEART CATH AND CORONARY ANGIOGRAPHY;  Surgeon: Laurey Morale, MD;  Location: [facility] INVASIVE CV LAB;  Service: Cardiovascular;  Laterality: N/A;   RIGHT/LEFT HEART CATH AND CORONARY ANGIOGRAPHY N/A 11/09/2020   Procedure: RIGHT/LEFT HEART CATH AND  CORONARY ANGIOGRAPHY;  Surgeon: Laurey Morale, MD;  Location: [facility] INVASIVE CV LAB;  Service: Cardiovascular;  Laterality: N/A;   TEE WITHOUT CARDIOVERSION N/A 01/25/2014   Procedure: TRANSESOPHAGEAL ECHOCARDIOGRAM (TEE);  Surgeon: Vesta Mixer, MD;  Location: [facility] ENDOSCOPY;  Service: Cardiovascular;  Laterality: N/A;   TEE WITHOUT CARDIOVERSION N/A 09/14/2014   Procedure: TRANSESOPHAGEAL ECHOCARDIOGRAM (TEE);  Surgeon: Quintella Reichert, MD;  Location: [facility] ENDOSCOPY;  Service: Cardiovascular;  Laterality: N/A;   TEE WITHOUT CARDIOVERSION N/A 03/16/2019   Procedure: TRANSESOPHAGEAL ECHOCARDIOGRAM (TEE);  Surgeon: Laurey Morale, MD;  Location: [facility] ENDOSCOPY;  Service: Cardiovascular;  Laterality: N/A;   TEE WITHOUT CARDIOVERSION N/A 06/08/2020   Procedure: TRANSESOPHAGEAL ECHOCARDIOGRAM (TEE);  Surgeon: Laurey Morale, MD;  Location: [facility] ENDOSCOPY;  Service: Cardiovascular;  Laterality: N/A;   VIDEO BRONCHOSCOPY WITH ENDOBRONCHIAL ULTRASOUND N/A 03/29/2021   Procedure: VIDEO BRONCHOSCOPY WITH ENDOBRONCHIAL ULTRASOUND;  Surgeon: Josephine Igo, DO;  Location: [facility];  Service: Pulmonary;  Laterality: N/A;       Home Medications    Prior to Admission medications   Medication Sig Start Date End Date Taking? Authorizing Provider  predniSONE (STERAPRED UNI-PAK 21 TAB) 10 MG (21) TBPK tablet As directed 02/10/23  Yes Peyton Rossner K, PA-C  acetaminophen (TYLENOL) 650 MG CR tablet Take 1,300 mg by mouth every 8 (eight) hours as needed for pain.    [provider]  amLODipine (NORVASC) 10 MG tablet TAKE 1 TABLET (10 MG TOTAL) BY MOUTH DAILY WITH LUNCH (NOON) 11/11/22   Laurey Morale, MD  bisacodyl (DULCOLAX) 5 MG EC tablet Take 1 tablet (5 mg total) by mouth daily as needed for moderate constipation. 03/23/19   Sherrilyn Rist, MD  carvedilol (COREG) 25 MG tablet Take 1.5 tablets (37.5 mg total) by mouth 2 (two) times daily with a meal. NEEDS FOLLOW UP APPOINTMENT FOR ANYMORE  REFILLS 01/16/23   Laurey Morale, MD  ELIQUIS 5 MG TABS tablet TAKE 1 TABLET (5 MG TOTAL) BY MOUTH 2 (TWO) TIMES DAILY. (AM+PM) 11/11/22   Laurey Morale, MD  furosemide (LASIX) 40 MG tablet TAKE 1 TABLET (40 MG TOTAL) BY MOUTH EVERY MORNING AND 1/2 TABLET (20 MG TOTAL) EVERY EVENING. 08/26/22   Laurey Morale, MD  hydrALAZINE (APRESOLINE) 50 MG tablet Take 1 tablet (50 mg total) by mouth 3 (three) times daily. Please call for office visit [phone] 06/05/22   Laurey Morale, MD  isosorbide mononitrate (IMDUR) 60 MG 24 hr tablet TAKE 1 & 1/2 TABLET (90 MG TOTAL) BY MOUTH DAILY (AM) 10/08/22   Laurey Morale, MD  losartan (COZAAR) 100 MG tablet TAKE 1 TABLET BY MOUTH IN THE MORNING AND 1/2 TABLET EVERY EVENING. 11/11/22   Laurey Morale, MD  Multiple Vitamin (MULTIVITAMIN WITH MINERALS) TABS tablet Take 1 tablet by mouth daily. 09/02/19   Sheth, Caren Macadam, MD  rosuvastatin (CRESTOR) 10 MG tablet TAKE 1 TABLET (10 MG TOTAL) BY MOUTH DAILY (BEDTIME) 03/07/21   Laurey Morale,  MD  spironolactone (ALDACTONE) 50 MG tablet TAKE 1 TABLET (50 MG TOTAL) BY MOUTH DAILY (AM) 10/08/22   Laurey Morale, MD    Family History Family History  Problem Relation Age of Onset   Heart Problems Mother    Stroke Mother    Heart failure Mother    Hypertension Mother    Heart Problems Father    Heart attack Father    Hypertension Father    Heart Problems Brother    Heart attack Brother    Hypertension Brother    Colon cancer Neg Hx    Esophageal cancer Neg Hx    Stomach cancer Neg Hx    Colon polyps Neg Hx    Rectal cancer Neg Hx     Social History Social History   Tobacco Use   Smoking status: Never   Smokeless tobacco: Never  Vaping Use   Vaping Use: Never used  Substance Use Topics   Alcohol use: Not Currently   Drug use: Never     Allergies   Entresto [sacubitril-valsartan] and Benazepril   Review of Systems Review of Systems  Constitutional:  Positive for activity change.  Negative for appetite change, fatigue and fever.  Eyes:  Negative for visual disturbance.  Respiratory:  Negative for shortness of breath.   Cardiovascular:  Negative for chest pain.  Gastrointestinal:  Negative for abdominal pain, diarrhea, nausea and vomiting.  Musculoskeletal:  Positive for arthralgias and gait problem. Negative for joint swelling and myalgias.  Skin:  Positive for color change. Negative for wound.  Neurological:  Negative for dizziness, weakness, numbness and headaches.     Physical Exam Triage Vital Signs ED Triage Vitals  Enc Vitals Group     BP 02/10/23 1101 (!) 167/108     Pulse Rate 02/10/23 1101 63     Resp 02/10/23 1101 18     Temp 02/10/23 1101 98.3 F (36.8 C)     Temp src --      SpO2 02/10/23 1101 98 %     Weight --      Height --      Head Circumference --      Peak Flow --      Pain Score 02/10/23 1059 10     Pain Loc --      Pain Edu? --      Excl. in GC? --    No data found.  Updated Vital Signs BP (!) 169/113   Pulse 62   Temp 98 F (36.7 C)   Resp 20   SpO2 98%   Visual Acuity Right Eye Distance:   Left Eye Distance:   Bilateral Distance:    Right Eye Near:   Left Eye Near:    Bilateral Near:     Physical Exam Vitals reviewed.  Constitutional:      General: He is awake.     Appearance: Normal appearance. He is well-developed. He is not ill-appearing.     Comments: Very pleasant male appears stated age in no acute distress sitting comfortably in exam room  HENT:     Head: Normocephalic and atraumatic.  Cardiovascular:     Rate and Rhythm: Normal rate and regular rhythm.     Heart sounds: Normal heart sounds, S1 normal and S2 normal. No murmur heard.    Comments: Capillary refill within 2 seconds left toes Pulmonary:     Effort: Pulmonary effort is normal.     Breath sounds: Normal breath sounds. No stridor. No  wheezing, rhonchi or rales.     Comments: Clear to auscultation bilaterally Abdominal:     General: Bowel  sounds are normal.     Palpations: Abdomen is soft.     Tenderness: There is no abdominal tenderness.  Musculoskeletal:     Left foot: Normal range of motion and normal capillary refill. Swelling and tenderness present. No bony tenderness.     Comments: Left foot: Tenderness and erythema noted MTP joint of the second, third, fourth toe.  Decreased range of motion with flexion secondary to pain.  Foot neurovascularly intact.  Neurological:     Mental Status: He is alert.  Psychiatric:        Behavior: Behavior is cooperative.      UC Treatments / Results  Labs (all labs ordered are listed, but only abnormal results are displayed) Labs Reviewed - No data to display  EKG   Radiology No results found.  Procedures Procedures (including critical care time)  Medications Ordered in UC Medications - No data to display  Initial Impression / Assessment and Plan / UC Course  I have reviewed the triage vital signs and the nursing notes.  Pertinent labs & imaging results that were available during my care of the patient were reviewed by me and considered in my medical decision making (see chart for details).     Patient is well-appearing, afebrile, nontoxic, nontachycardic.  Low suspicion for septic arthritis given clinical presentation with suspicion for gout flare.  Patient is limited in the medications that can be used to treat gout given his past medical history and current medication regimen so we will use prednisone taper.  He denies any history of diabetes.  Discussed that he is not to take NSAIDs with this medication.  Encouraged him to limit alcohol consumption and monitor his diet closely to help prevent recurrent flares.  Discussed that if he has any worsening or changing symptoms including increasing pain, swelling, fever, nausea, vomiting he needs to be seen immediately.  Plain films were deferred as patient denies any recent trauma but discussed that we would consider this in  the future if symptoms or not improving quickly.  Strict return precautions given.  Work excuse note provided.  Blood pressure is elevated.  Patient attributes this to pain.  He denies any signs/symptoms of endorgan damage.  Recommended that he follow-up with his primary care if his blood pressure does not improve with treatment of gout flare.  Discussed that if he develops any chest pain, shortness of breath, headache, vision change, dizziness in the setting of high blood pressure he needs to be seen immediately.  Final Clinical Impressions(s) / UC Diagnoses   Final diagnoses:  Acute gout due to renal impairment involving left foot  Elevated blood pressure reading with diagnosis of hypertension     Discharge Instructions      We are treating you for gout.  Start prednisone taper as prescribed.  Do not take NSAIDs including aspirin, ibuprofen/Advil, naproxen/Aleve.  You can use Tylenol for breakthrough pain.  Keep your foot elevated and avoid activity until it improves.  If your symptoms or not improving please follow-up with podiatry; call to schedule an appointment.  If anything changes or worsens and you have increasing pain, spread of redness, fever, nausea, vomiting you need to be seen immediately.  Your blood pressure is elevated.  Monitor this at home if it is persistently above 140/90 you should be reevaluated.  If you develop any chest pain, shortness of breath,  headache, vision change, dizziness in the setting of high blood pressure you need to be seen immediately.     ED Prescriptions     Medication Sig Dispense Auth. Provider   predniSONE (STERAPRED UNI-PAK 21 TAB) 10 MG (21) TBPK tablet As directed 21 tablet Fleur Audino K, PA-C      PDMP not reviewed this encounter.   Jeani Hawking, PA-C 02/10/23 1200    Karandeep Resende, Noberto Retort, PA-C 02/10/23 1200

## 2023-04-10 ENCOUNTER — Other Ambulatory Visit (HOSPITAL_COMMUNITY): Payer: Self-pay | Admitting: Cardiology

## 2023-04-10 DIAGNOSIS — I4892 Unspecified atrial flutter: Secondary | ICD-10-CM

## 2023-05-21 ENCOUNTER — Other Ambulatory Visit (HOSPITAL_COMMUNITY): Payer: Self-pay | Admitting: Cardiology

## 2023-05-22 ENCOUNTER — Other Ambulatory Visit (HOSPITAL_COMMUNITY): Payer: Self-pay | Admitting: Cardiology

## 2023-07-02 ENCOUNTER — Encounter (HOSPITAL_COMMUNITY): Payer: Self-pay

## 2023-07-02 ENCOUNTER — Ambulatory Visit (HOSPITAL_COMMUNITY)
Admission: EM | Admit: 2023-07-02 | Discharge: 2023-07-02 | Disposition: A | Discharge status: Home / Self Care | Payer: Medicaid Other | Attending: Sports Medicine | Admitting: Sports Medicine

## 2023-07-02 DIAGNOSIS — M546 Pain in thoracic spine: Secondary | ICD-10-CM | POA: Diagnosis not present

## 2023-07-02 DIAGNOSIS — I10 Essential (primary) hypertension: Secondary | ICD-10-CM

## 2023-07-02 DIAGNOSIS — T148XXA Other injury of unspecified body region, initial encounter: Secondary | ICD-10-CM

## 2023-07-02 MED ORDER — METHYLPREDNISOLONE SODIUM SUCC 125 MG IJ SOLR
80.0000 mg | Freq: Once | INTRAMUSCULAR | Status: AC
  Administered 2023-07-02: 80 mg via INTRAMUSCULAR

## 2023-07-02 MED ORDER — CYCLOBENZAPRINE HCL 10 MG PO TABS: 10.0000 mg | ORAL_TABLET | Freq: Two times a day (BID) | ORAL | 0 refills | Status: DC | PRN

## 2023-07-02 MED ORDER — METHYLPREDNISOLONE SODIUM SUCC 125 MG IJ SOLR
INTRAMUSCULAR | Status: AC
  Filled 2023-07-02: qty 2

## 2023-07-02 NOTE — ED Provider Notes (Signed)
[facility]    CSN: [id number] Arrival date & time: 07/02/23  0802      History   Chief Complaint Chief Complaint  Patient presents with   Back Pain    HPI Daniel Joseph is a 62 y.o. male.   [name] is a 62yo male here for evaluation of back pain.  He notes that this started roughly 1 week ago after he was painting a Nurse, children's.  He notes that he was doing a lot of lifting and twisting of heavy items at the time.  He locates pain to the left lateral aspect of his mid back.  He denies any radiation of pain.  Pain is worsened with twisting activities and flexion of his back.  He has tried Tylenol 1 g once daily without significant improvement.  He cannot take NSAIDs secondary to being on Eliquis and CKD.  He denies fevers, chills, chest pain or pressure, abdominal pain, stool changes, pain with urination, limb weakness, or reticulation of the pain.  Denies overlying rash.  His blood pressure is elevated here.  Initial was 183/121 on recheck was 167/101. Denies any associated chest pain, shortness of breath, headache, vision change. He states that he has not taken his antihypertensive this morning.  He attributes this elevation to pain, stating it is typically in the 150s/90s at home.    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, PASP 79   Gout    H/O atrial flutter 05/2020   but back to NSR   Mitral valve regurgitation    mild to moderate MR with MVP of ANMVL by echo 2019   PVC's  (premature ventricular contractions)    nonsustained VT as well as PVCs - no ICD indicated due to EF 40-45%   Sleep apnea    pending second sleep study    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    Patient Active Problem List   Diagnosis Date Noted   Adenopathy 03/14/2021   Neuropathy 02/02/2020   Nausea 02/02/2020   Acute exacerbation of CHF (congestive heart failure) (HCC) 08/29/2019   Alcohol abuse 08/29/2019   Hypoxic Respiratory failure, acute (HCC) 08/29/2019   PVC's (premature ventricular contractions)    HTN (hypertension) 03/02/2014   Chronic combined systolic and diastolic CHF (congestive heart failure) (HCC) 03/02/2014   Hypertensive cardiomyopathy (HCC) 03/02/2014   CKD (chronic kidney disease), stage III (HCC) 02/15/2014   Neck nodule 02/01/2014   Eye redness 02/01/2014   Mitral valve regurgitation 01/21/2014   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    Past Surgical History:  Procedure Laterality Date   ABDOMINAL SURGERY  at birth   BRONCHIAL NEEDLE ASPIRATION BIOPSY  03/29/2021   Procedure: BRONCHIAL NEEDLE ASPIRATION BIOPSIES;  Surgeon: Josephine Igo, DO;  Location: [facility];  Service: Pulmonary;;   BUBBLE STUDY  03/16/2019   Procedure: BUBBLE STUDY;  Surgeon: Laurey Morale, MD;  Location: [facility] ENDOSCOPY;  Service: Cardiovascular;;  CARDIAC CATHETERIZATION     CARDIAC VALVE REPLACEMENT     CARDIOVERSION N/A 06/08/2020   Procedure: CARDIOVERSION;  Surgeon: Laurey Morale, MD;  Location: [facility] ENDOSCOPY;  Service: Cardiovascular;  Laterality: N/A;   RIGHT/LEFT HEART CATH AND CORONARY ANGIOGRAPHY N/A 03/16/2019   Procedure: RIGHT/LEFT HEART CATH AND CORONARY ANGIOGRAPHY;  Surgeon: Laurey Morale, MD;  Location: [facility] INVASIVE CV LAB;  Service: Cardiovascular;  Laterality: N/A;   RIGHT/LEFT HEART CATH AND CORONARY ANGIOGRAPHY N/A 11/09/2020   Procedure: RIGHT/LEFT HEART CATH AND CORONARY ANGIOGRAPHY;  Surgeon: Laurey Morale, MD;  Location: [facility] INVASIVE  CV LAB;  Service: Cardiovascular;  Laterality: N/A;   TEE WITHOUT CARDIOVERSION N/A 01/25/2014   Procedure: TRANSESOPHAGEAL ECHOCARDIOGRAM (TEE);  Surgeon: Vesta Mixer, MD;  Location: [facility] ENDOSCOPY;  Service: Cardiovascular;  Laterality: N/A;   TEE WITHOUT CARDIOVERSION N/A 09/14/2014   Procedure: TRANSESOPHAGEAL ECHOCARDIOGRAM (TEE);  Surgeon: Quintella Reichert, MD;  Location: [facility] ENDOSCOPY;  Service: Cardiovascular;  Laterality: N/A;   TEE WITHOUT CARDIOVERSION N/A 03/16/2019   Procedure: TRANSESOPHAGEAL ECHOCARDIOGRAM (TEE);  Surgeon: Laurey Morale, MD;  Location: [facility] ENDOSCOPY;  Service: Cardiovascular;  Laterality: N/A;   TEE WITHOUT CARDIOVERSION N/A 06/08/2020   Procedure: TRANSESOPHAGEAL ECHOCARDIOGRAM (TEE);  Surgeon: Laurey Morale, MD;  Location: [facility] ENDOSCOPY;  Service: Cardiovascular;  Laterality: N/A;   VIDEO BRONCHOSCOPY WITH ENDOBRONCHIAL ULTRASOUND N/A 03/29/2021   Procedure: VIDEO BRONCHOSCOPY WITH ENDOBRONCHIAL ULTRASOUND;  Surgeon: Josephine Igo, DO;  Location: [facility] ENDOSCOPY;  Service: Pulmonary;  Laterality: N/A;       Home Medications    Prior to Admission medications   Medication Sig Start Date End Date Taking? Authorizing Provider  amLODipine (NORVASC) 10 MG tablet TAKE 1 TABLET (10 MG TOTAL) BY MOUTH DAILY WITH LUNCH (NOON) 11/11/22  Yes Laurey Morale, MD  apixaban (ELIQUIS) 5 MG TABS tablet Take 1 tablet (5 mg total) by mouth 2 (two) times daily. NEEDS FOLLOW UP APPOINTMENT FOR MORE REFILLS 04/10/23  Yes Laurey Morale, MD  bisacodyl (DULCOLAX) 5 MG EC tablet Take 1 tablet (5 mg total) by mouth daily as needed for moderate constipation. 03/23/19  Yes Danis, Starr Lake III, MD  carvedilol (COREG) 25 MG tablet TAKE 1 & 1/2 TABLETS (37.5 MG TOTAL) BY MOUTH 2 (TWO) TIMES DAILY WITH A MEAL. NEEDS FOLLOW UP APPOINTMENT FOR ANYMORE REFILLS 05/22/23  Yes Laurey Morale, MD  cyclobenzaprine (FLEXERIL) 10 MG tablet Take 1 tablet (10 mg total) by mouth 2 (two) times daily as  needed for muscle spasms. 07/02/23  Yes Marisa Cyphers, MD  furosemide (LASIX) 40 MG tablet TAKE 1 TABLET (40 MG TOTAL) BY MOUTH EVERY MORNING AND 1/2 TABLET (20 MG TOTAL) EVERY EVENING. 08/26/22  Yes Laurey Morale, MD  hydrALAZINE (APRESOLINE) 50 MG tablet TAKE 1 TABLET (50 MG TOTAL) BY MOUTH 3 (THREE) TIMES DAILY(AM+NOON+EVENING) 05/22/23  Yes Laurey Morale, MD  isosorbide mononitrate (IMDUR) 60 MG 24 hr tablet TAKE 1 & 1/2 TABLET (90 MG TOTAL) BY MOUTH DAILY (AM) 10/08/22  Yes Laurey Morale, MD  losartan (COZAAR) 100 MG tablet TAKE 1 TABLET BY MOUTH IN THE MORNING AND 1/2 TABLET EVERY EVENING. 11/11/22  Yes Laurey Morale, MD  Multiple Vitamin (MULTIVITAMIN WITH MINERALS) TABS tablet Take 1 tablet by mouth daily. 09/02/19  Yes Sheth, Devam P, MD  rosuvastatin (CRESTOR) 10 MG tablet Take 1 tablet (10 mg total) by mouth daily. NEEDS FOLLOW UP APPOINTMENT FOR MORE REFILLS 04/10/23  Yes Laurey Morale,  MD  spironolactone (ALDACTONE) 50 MG tablet TAKE 1 TABLET (50 MG TOTAL) BY MOUTH DAILY (AM) 10/08/22  Yes Laurey Morale, MD  acetaminophen (TYLENOL) 650 MG CR tablet Take 1,300 mg by mouth every 8 (eight) hours as needed for pain.    [provider]    Family History Family History  Problem Relation Age of Onset   Heart Problems Mother    Stroke Mother    Heart failure Mother    Hypertension Mother    Heart Problems Father    Heart attack Father    Hypertension Father    Heart Problems Brother    Heart attack Brother    Hypertension Brother    Colon cancer Neg Hx    Esophageal cancer Neg Hx    Stomach cancer Neg Hx    Colon polyps Neg Hx    Rectal cancer Neg Hx     Social History Social History   Tobacco Use   Smoking status: Never   Smokeless tobacco: Never  Vaping Use   Vaping status: Never Used  Substance Use Topics   Alcohol use: Not Currently   Drug use: Never     Allergies   Entresto [sacubitril-valsartan] and Benazepril   Review of  Systems Review of Systems  Musculoskeletal:  Positive for back pain.     Physical Exam Updated Vital Signs BP (!) 183/121 (BP Location: Left Arm) Comment: Did not take meds today.  Pulse 92   Temp 98.6 F (37 C) (Oral)   Resp 16   SpO2 96%    Physical Exam Constitutional:      General: He is not in acute distress.    Appearance: Normal appearance. He is normal weight.  HENT:     Head: Normocephalic and atraumatic.  Eyes:     Extraocular Movements: Extraocular movements intact.     Pupils: Pupils are equal, round, and reactive to light.  Cardiovascular:     Rate and Rhythm: Normal rate and regular rhythm.     Pulses: Normal pulses.     Heart sounds: Murmur heard.     No friction rub. No gallop.  Pulmonary:     Effort: Pulmonary effort is normal.     Breath sounds: Normal breath sounds. No wheezing, rhonchi or rales.  Abdominal:     General: Bowel sounds are normal.     Palpations: Abdomen is soft.     Tenderness: There is no abdominal tenderness. There is no right CVA tenderness or left CVA tenderness.  Musculoskeletal:     Cervical back: Normal range of motion and neck supple.     Comments: Reproducible tenderness to palpation on the left lateral posterior chest wall around ribs 7 through 9.  No appreciable bony step-off or deformity noted.  No overlying swelling.  He is limited in rotational range of motion in his thoracic spine secondary to pain.  Strength is full in bilateral upper and lower extremities.  Skin:    General: Skin is warm and dry.     Comments: No overlying skin changes  Neurological:     General: No focal deficit present.     Mental Status: He is alert and oriented to person, place, and time. Mental status is at baseline.     Sensory: No sensory deficit.     Motor: No weakness.     Gait: Gait normal.  Psychiatric:        Mood and Affect: Mood normal.  Behavior: Behavior normal.      UC Treatments / Results  Labs (all labs ordered are  listed, but only abnormal results are displayed) Labs Reviewed - No data to display  EKG   Radiology No results found.  Procedures Procedures (including critical care time)  Medications Ordered in UC Medications  methylPREDNISolone sodium succinate (SOLU-MEDROL) 125 mg/2 mL injection 80 mg (80 mg Intramuscular Given 07/02/23 0839)    Initial Impression / Assessment and Plan / UC Course  I have reviewed the triage vital signs and the nursing notes.  Pertinent labs & imaging results that were available during my care of the patient were reviewed by me and considered in my medical decision making (see chart for details).  Clinical Course as of 07/02/23 0852  Wed Jul 02, 2023  0831 BP(!): 183/121 [AC]    Clinical Course User Index [AC] Marisa Cyphers, MD    Vitals and triage reviewed, patient is hemodynamically stable though blood pressure is elevated as discussed in HPI and below. His back pain is isolated to the mid thoracic back in the lateral chest wall. It doesn't radiate. I suspect he has a serratus or latissimus muscle strain. Recommended he continue tylenol, try heating pad therapy, and as needed flexeril. He did receive an IM steroid injection here today. If pain is not improving over the next week recommend PCP follow-up or return. Patient expressed understanding.  Blood pressure is elevated.  Patient attributes this to pain.  He denies any signs/symptoms of endorgan damage.  Recommended that he follow-up with his primary care if his blood pressure does not improve with treatment of his back pain.  Discussed that if he develops any chest pain, shortness of breath, headache, vision change, dizziness in the setting of high blood pressure he needs to be seen immediately.  Final Clinical Impressions(s) / UC Diagnoses   Final diagnoses:  Acute left-sided thoracic back pain     Discharge Instructions      I think this is a muscle strain. You were given a steroid  injection today and provided a prescription for a muscle relaxer to take as needed up to 8 hours apart. Continue the tylenol at 1000mg  but may increase frequency to every 8 hours. I also recommend trying a heating pad as this may help with your pain. Please start the upper back therapy exercises provided today.  If this is not improving in the next week I recommend follow-up with your primary care physician.   Your blood pressure is elevated.  Monitor this at home if it is persistently above 140/90 you should be reevaluated.  If you develop any chest pain, shortness of breath, headache, vision change, dizziness in the setting of high blood pressure you need to be seen immediately.     ED Prescriptions     Medication Sig Dispense Auth. Provider   cyclobenzaprine (FLEXERIL) 10 MG tablet Take 1 tablet (10 mg total) by mouth 2 (two) times daily as needed for muscle spasms. 30 tablet Marisa Cyphers, MD      PDMP not reviewed this encounter.   Marisa Cyphers, MD 07/02/23 [phone]

## 2023-07-02 NOTE — ED Triage Notes (Signed)
Pt presents to the office for lower back pain x 1 week. Pt denies any injury or falls.

## 2023-07-02 NOTE — Discharge Instructions (Addendum)
I think this is a muscle strain. You were given a steroid injection today and provided a prescription for a muscle relaxer to take as needed up to 8 hours apart. Continue the tylenol at 1000mg  but may increase frequency to every 8 hours. I also recommend trying a heating pad as this may help with your pain. Please start the upper back therapy exercises provided today.  If this is not improving in the next week I recommend follow-up with your primary care physician.   Your blood pressure is elevated.  Monitor this at home if it is persistently above 140/90 you should be reevaluated.  If you develop any chest pain, shortness of breath, headache, vision change, dizziness in the setting of high blood pressure you need to be seen immediately.

## 2023-07-04 ENCOUNTER — Other Ambulatory Visit (HOSPITAL_COMMUNITY): Payer: Self-pay

## 2023-07-04 DIAGNOSIS — I5042 Chronic combined systolic (congestive) and diastolic (congestive) heart failure: Secondary | ICD-10-CM

## 2023-07-04 MED ORDER — ROSUVASTATIN CALCIUM 10 MG PO TABS
10.0000 mg | ORAL_TABLET | Freq: Every day | ORAL | 0 refills | Status: DC
Start: 2023-07-04 — End: 2023-11-20

## 2023-07-08 ENCOUNTER — Other Ambulatory Visit (HOSPITAL_COMMUNITY): Payer: Self-pay | Admitting: Cardiology

## 2023-07-08 DIAGNOSIS — I4892 Unspecified atrial flutter: Secondary | ICD-10-CM

## 2023-07-08 MED ORDER — APIXABAN 5 MG PO TABS
5.0000 mg | ORAL_TABLET | Freq: Two times a day (BID) | ORAL | 0 refills | Status: DC
Start: 2023-07-08 — End: 2023-07-30

## 2023-07-30 ENCOUNTER — Other Ambulatory Visit (HOSPITAL_COMMUNITY): Payer: Self-pay | Admitting: Cardiology

## 2023-07-30 DIAGNOSIS — I4892 Unspecified atrial flutter: Secondary | ICD-10-CM

## 2023-09-09 ENCOUNTER — Ambulatory Visit (HOSPITAL_COMMUNITY)
Admission: EM | Admit: 2023-09-09 | Discharge: 2023-09-09 | Disposition: A | Discharge status: Home / Self Care | Payer: Medicaid Other | Attending: Nurse Practitioner | Admitting: Nurse Practitioner

## 2023-09-09 ENCOUNTER — Encounter (HOSPITAL_COMMUNITY): Payer: Self-pay

## 2023-09-09 DIAGNOSIS — Z7952 Long term (current) use of systemic steroids: Secondary | ICD-10-CM | POA: Diagnosis not present

## 2023-09-09 DIAGNOSIS — M109 Gout, unspecified: Secondary | ICD-10-CM | POA: Insufficient documentation

## 2023-09-09 DIAGNOSIS — M25521 Pain in right elbow: Secondary | ICD-10-CM | POA: Diagnosis present

## 2023-09-09 DIAGNOSIS — Z8739 Personal history of other diseases of the musculoskeletal system and connective tissue: Secondary | ICD-10-CM | POA: Insufficient documentation

## 2023-09-09 DIAGNOSIS — I130 Hypertensive heart and chronic kidney disease with heart failure and stage 1 through stage 4 chronic kidney disease, or unspecified chronic kidney disease: Secondary | ICD-10-CM | POA: Insufficient documentation

## 2023-09-09 LAB — URIC ACID: Uric Acid, Serum: 7.2 mg/dL (ref 3.7–8.6)

## 2023-09-09 MED ORDER — PREDNISONE 10 MG PO TABS: ORAL_TABLET | ORAL | 0 refills | Status: AC

## 2023-09-09 NOTE — Discharge Instructions (Addendum)
We checked your gout level today.  As we discussed, we are treating you for gout today.  If the pain does not improve significantly with the medicine, recommend follow-up with orthopedics; contact formation has been provided.  Recommend close follow-up with PCP to discuss gout level and see if we need to increase gout prevention medicine.

## 2023-09-09 NOTE — ED Triage Notes (Signed)
Pt presents with 10/10 right elbow pain that began yesterday. No known injuries. Pt states he took Tylenol for his pain with no relief.

## 2023-09-09 NOTE — ED Provider Notes (Signed)
[facility]    CSN: [id number] Arrival date & time: 09/09/23  1324      History   Chief Complaint Chief Complaint  Patient presents with   Elbow Pain    HPI Daniel Joseph is a 62 y.o. male.   Patient presents today for right elbow pain that began suddenly yesterday.  He denies recent fall, trauma, or known injury to the elbow.  Reports history of gout in his knees and elbow.  Reports pain is currently 10 out of 10 and is difficult to move the elbow.  He endorses swelling in the elbow.  No warmth, redness, fever, nausea/vomiting since the pain began.  He takes allopurinol for gout prevention.  Last gout flare was earlier this year -at least 3 months ago.  Patient is convinced this is gout flare.  Patient reports he has not taken blood pressure medication yet today, otherwise is compliant with blood pressure medication.    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, PASP 79   Gout    H/O atrial flutter 05/2020   but back to NSR   Mitral valve regurgitation    mild to moderate MR with MVP of ANMVL by echo 2019   PVC's (premature ventricular contractions)    nonsustained VT as well as PVCs - no ICD indicated due to EF 40-45%   Sleep apnea    pending second sleep study    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    Patient Active Problem List   Diagnosis Date Noted   Adenopathy 03/14/2021   Neuropathy 02/02/2020   Nausea 02/02/2020   Acute exacerbation of CHF (congestive heart  failure) (HCC) 08/29/2019   Alcohol abuse 08/29/2019   Hypoxic Respiratory failure, acute (HCC) 08/29/2019   PVC's (premature ventricular contractions)    HTN (hypertension) 03/02/2014   Chronic combined systolic and diastolic CHF (congestive heart failure) (HCC) 03/02/2014   Hypertensive cardiomyopathy (HCC) 03/02/2014   CKD (chronic kidney disease), stage III (HCC) 02/15/2014   Neck nodule 02/01/2014   Eye redness 02/01/2014   Mitral valve regurgitation 01/21/2014   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    Past Surgical History:  Procedure Laterality Date   ABDOMINAL SURGERY  at birth   BRONCHIAL NEEDLE ASPIRATION BIOPSY  03/29/2021   Procedure: BRONCHIAL NEEDLE ASPIRATION BIOPSIES;  Surgeon: Josephine Igo, DO;  Location: [facility];  Service: Pulmonary;;   BUBBLE STUDY  03/16/2019   Procedure: BUBBLE STUDY;  Surgeon: Laurey Morale, MD;  Location: [facility] ENDOSCOPY;  Service: Cardiovascular;;   CARDIAC CATHETERIZATION     CARDIAC VALVE REPLACEMENT     CARDIOVERSION N/A 06/08/2020   Procedure: CARDIOVERSION;  Surgeon: Laurey Morale, MD;  Location: [facility] ENDOSCOPY;  Service: Cardiovascular;  Laterality: N/A;   RIGHT/LEFT HEART CATH AND CORONARY ANGIOGRAPHY N/A 03/16/2019   Procedure: RIGHT/LEFT HEART CATH AND CORONARY ANGIOGRAPHY;  Surgeon: Laurey Morale, MD;  Location: [facility] INVAS[facility] CV LAB;  Service: Cardiovascular;  Laterality:  N/A;   RIGHT/LEFT HEART CATH AND CORONARY ANGIOGRAPHY N/A 11/09/2020   Procedure: RIGHT/LEFT HEART CATH AND CORONARY ANGIOGRAPHY;  Surgeon: Laurey Morale, MD;  Location: [facility] INVASIVE CV LAB;  Service: Cardiovascular;  Laterality: N/A;   TEE WITHOUT CARDIOVERSION N/A 01/25/2014   Procedure: TRANSESOPHAGEAL ECHOCARDIOGRAM (TEE);  Surgeon: Vesta Mixer, MD;  Location: [facility] ENDOSCOPY;  Service: Cardiovascular;  Laterality: N/A;   TEE WITHOUT CARDIOVERSION N/A 09/14/2014   Procedure: TRANSESOPHAGEAL ECHOCARDIOGRAM (TEE);  Surgeon: Quintella Reichert, MD;  Location:  [facility] ENDOSCOPY;  Service: Cardiovascular;  Laterality: N/A;   TEE WITHOUT CARDIOVERSION N/A 03/16/2019   Procedure: TRANSESOPHAGEAL ECHOCARDIOGRAM (TEE);  Surgeon: Laurey Morale, MD;  Location: [facility] ENDOSCOPY;  Service: Cardiovascular;  Laterality: N/A;   TEE WITHOUT CARDIOVERSION N/A 06/08/2020   Procedure: TRANSESOPHAGEAL ECHOCARDIOGRAM (TEE);  Surgeon: Laurey Morale, MD;  Location: [facility] ENDOSCOPY;  Service: Cardiovascular;  Laterality: N/A;   VIDEO BRONCHOSCOPY WITH ENDOBRONCHIAL ULTRASOUND N/A 03/29/2021   Procedure: VIDEO BRONCHOSCOPY WITH ENDOBRONCHIAL ULTRASOUND;  Surgeon: Josephine Igo, DO;  Location: [facility] ENDOSCOPY;  Service: Pulmonary;  Laterality: N/A;       Home Medications    Prior to Admission medications   Medication Sig Start Date End Date Taking? Authorizing Provider  aspirin EC 81 MG tablet Take by mouth. 04/01/14  Yes [provider]  benazepril (LOTENSIN) 40 MG tablet Take by mouth. 02/12/17  Yes [provider]  meloxicam (MOBIC) 7.5 MG tablet Take 1 tablet by mouth daily. 12/14/20  Yes [provider]  predniSONE (DELTASONE) 10 MG tablet Take 4 tablets (40 mg total) by mouth daily for 2 days, THEN 3 tablets (30 mg total) daily for 2 days, THEN 2 tablets (20 mg total) daily for 2 days, THEN 1 tablet (10 mg total) daily for 2 days, THEN 0.5 tablets (5 mg total) daily for 2 days. 09/09/23 09/19/23 Yes Valentino Nose, NP  acetaminophen (TYLENOL) 650 MG CR tablet Take 1,300 mg by mouth every 8 (eight) hours as needed for pain.    [provider]  allopurinol (ZYLOPRIM) 100 MG tablet Take 200 mg by mouth every morning.    [provider]  amLODipine (NORVASC) 10 MG tablet TAKE 1 TABLET (10 MG TOTAL) BY MOUTH DAILY WITH LUNCH (NOON) 11/11/22   Laurey Morale, MD  apixaban (ELIQUIS) 5 MG TABS tablet Take 1 tablet (5 mg total) by mouth 2 (two) times daily. NEEDS FOLLOW UP APPOINTMENT FOR MORE REFILLS 07/30/23   Laurey Morale, MD   bisacodyl (DULCOLAX) 5 MG EC tablet Take 1 tablet (5 mg total) by mouth daily as needed for moderate constipation. 03/23/19   Charlie Pitter III, MD  carvedilol (COREG) 25 MG tablet TAKE 1 & 1/2 TABLETS (37.5 MG TOTAL) BY MOUTH 2 (TWO) TIMES DAILY WITH A MEAL. NEEDS FOLLOW UP APPOINTMENT FOR ANYMORE REFILLS 05/22/23   Laurey Morale, MD  cloNIDine (CATAPRES) 0.1 MG tablet Take 1 tablet by mouth 2 (two) times daily.    [provider]  cyclobenzaprine (FLEXERIL) 10 MG tablet Take 1 tablet (10 mg total) by mouth 2 (two) times daily as needed for muscle spasms. 07/02/23   Marisa Cyphers, MD  furosemide (LASIX) 40 MG tablet TAKE 1 TABLET IN THE MORNING AND 1/2 TABLET IN THE EVERY. NEEDS FOLLOW UP APPOINTMENT FOR ANYMORE REFILLS 07/30/23   Laurey Morale, MD  hydrALAZINE (APRESOLINE) 50 MG tablet TAKE 1 TABLET (50 MG TOTAL) BY MOUTH 3 (THREE) TIMES DAILY(AM+NOON+EVENING) 05/22/23  Laurey Morale, MD  isosorbide mononitrate (IMDUR) 60 MG 24 hr tablet TAKE 1 & 1/2 TABLET (90 MG TOTAL) BY MOUTH DAILY (AM) 10/08/22   Laurey Morale, MD  losartan (COZAAR) 100 MG tablet TAKE 1 TABLET BY MOUTH IN THE MORNING AND 1/2 TABLET EVERY EVENING. 11/11/22   Laurey Morale, MD  metoprolol tartrate (LOPRESSOR) 50 MG tablet take 2 tablets (100 mg) by oral route 2 times per day    [provider]  Multiple Vitamin (MULTIVITAMIN WITH MINERALS) TABS tablet Take 1 tablet by mouth daily. 09/02/19   Sheth, Caren Macadam, MD  rosuvastatin (CRESTOR) 10 MG tablet Take 1 tablet (10 mg total) by mouth daily. NEEDS FOLLOW UP APPOINTMENT FOR MORE REFILLS 07/04/23   Laurey Morale, MD  spironolactone (ALDACTONE) 50 MG tablet TAKE 1 TABLET (50 MG TOTAL) BY MOUTH DAILY (AM) 10/08/22   Laurey Morale, MD    Family History Family History  Problem Relation Age of Onset   Heart Problems Mother    Stroke Mother    Heart failure Mother    Hypertension Mother    Heart Problems Father    Heart attack Father     Hypertension Father    Heart Problems Brother    Heart attack Brother    Hypertension Brother    Colon cancer Neg Hx    Esophageal cancer Neg Hx    Stomach cancer Neg Hx    Colon polyps Neg Hx    Rectal cancer Neg Hx     Social History Social History   Tobacco Use   Smoking status: Never   Smokeless tobacco: Never  Vaping Use   Vaping status: Never Used  Substance Use Topics   Alcohol use: Not Currently   Drug use: Never     Allergies   Entresto [sacubitril-valsartan] and Benazepril   Review of Systems Review of Systems Per HPI  Physical Exam Triage Vital Signs ED Triage Vitals  Encounter Vitals Group     BP 09/09/23 0833 (!) 178/110     Systolic BP Percentile --      Diastolic BP Percentile --      Pulse Rate 09/09/23 0833 63     Resp 09/09/23 0833 18     Temp 09/09/23 0833 98 F (36.7 C)     Temp Source 09/09/23 0833 Oral     SpO2 09/09/23 0833 97 %     Weight 09/09/23 0830 225 lb (102.1 kg)     Height 09/09/23 0830 5\' 8"  (1.727 m)     Head Circumference --      Peak Flow --      Pain Score 09/09/23 0830 10     Pain Loc --      Pain Education --      Exclude from Growth Chart --    No data found.  Updated Vital Signs BP (!) 178/110 (BP Location: Left Arm) Comment: pt states he has not taken his BP medications today.  Pulse 63   Temp 98 F (36.7 C) (Oral)   Resp 18   Ht 5\' 8"  (1.727 m)   Wt 225 lb (102.1 kg)   SpO2 97%   BMI 34.21 kg/m   Visual Acuity Right Eye Distance:   Left Eye Distance:   Bilateral Distance:    Right Eye Near:   Left Eye Near:    Bilateral Near:     Physical Exam Vitals and nursing note reviewed.  Constitutional:  General: He is not in acute distress.    Appearance: Normal appearance. He is not toxic-appearing.  Pulmonary:     Effort: Pulmonary effort is normal. No respiratory distress.  Musculoskeletal:     Comments: Inspection: Mild swelling noted to  near medial epicondyle; no bruising, obvious  deformity or redness Palpation: tender to palpation entire elbow diffusely; no obvious deformities palpated or effusion ROM: Full ROM to right elbow, although painful with flexion and extension Strength:  difficult to assess secondary to pain Neurovascular: neurovascularly intact in distal bilateral upper extremities  Skin:    General: Skin is warm and dry.     Capillary Refill: Capillary refill takes less than 2 seconds.     Coloration: Skin is not jaundiced or pale.     Findings: No erythema.  Neurological:     Mental Status: He is alert and oriented to person, place, and time.  Psychiatric:        Behavior: Behavior is cooperative.      UC Treatments / Results  Labs (all labs ordered are listed, but only abnormal results are displayed) Labs Reviewed  URIC ACID    EKG   Radiology No results found.  Procedures Procedures (including critical care time)  Medications Ordered in UC Medications - No data to display  Initial Impression / Assessment and Plan / UC Course  I have reviewed the triage vital signs and the nursing notes.  Pertinent labs & imaging results that were available during my care of the patient were reviewed by me and considered in my medical decision making (see chart for details).   Patient is well-appearing, afebrile, not tachycardic, not tachypneic, oxygenating well on room air.  Patient is hypertensive in triage today, likely secondary to not taking blood pressure medication today.  1. Right elbow pain 2. History of gout Will treat for gout with oral prednisone We are checking uric acid level today Recommended close follow-up with PCP to discuss uric acid level and discuss allopurinol dosing Follow up with Ortho if symptoms do not improve with treatment and contact information provided  The patient was given the opportunity to ask questions.  All questions answered to their satisfaction.  The patient is in agreement to this plan.    Final  Clinical Impressions(s) / UC Diagnoses   Final diagnoses:  Right elbow pain  History of gout     Discharge Instructions      We checked your gout level today.  As we discussed, we are treating you for gout today.  If the pain does not improve significantly with the medicine, recommend follow-up with orthopedics; contact formation has been provided.  Recommend close follow-up with PCP to discuss gout level and see if we need to increase gout prevention medicine.   ED Prescriptions     Medication Sig Dispense Auth. Provider   predniSONE (DELTASONE) 10 MG tablet Take 4 tablets (40 mg total) by mouth daily for 2 days, THEN 3 tablets (30 mg total) daily for 2 days, THEN 2 tablets (20 mg total) daily for 2 days, THEN 1 tablet (10 mg total) daily for 2 days, THEN 0.5 tablets (5 mg total) daily for 2 days. 21 tablet Valentino Nose, NP      PDMP not reviewed this encounter.   Valentino Nose, NP 09/09/23 [phone]

## 2023-10-20 ENCOUNTER — Ambulatory Visit (INDEPENDENT_AMBULATORY_CARE_PROVIDER_SITE_OTHER): Payer: Medicaid Other

## 2023-10-20 ENCOUNTER — Ambulatory Visit (HOSPITAL_COMMUNITY)
Admission: EM | Admit: 2023-10-20 | Discharge: 2023-10-20 | Disposition: A | Discharge status: Home / Self Care | Payer: Medicaid Other | Attending: Emergency Medicine | Admitting: Emergency Medicine

## 2023-10-20 ENCOUNTER — Encounter (HOSPITAL_COMMUNITY): Payer: Self-pay

## 2023-10-20 DIAGNOSIS — J069 Acute upper respiratory infection, unspecified: Secondary | ICD-10-CM | POA: Diagnosis not present

## 2023-10-20 DIAGNOSIS — R051 Acute cough: Secondary | ICD-10-CM | POA: Diagnosis not present

## 2023-10-20 DIAGNOSIS — R062 Wheezing: Secondary | ICD-10-CM

## 2023-10-20 MED ORDER — ALBUTEROL SULFATE HFA 108 (90 BASE) MCG/ACT IN AERS
2.0000 | INHALATION_SPRAY | Freq: Once | RESPIRATORY_TRACT | Status: AC
  Administered 2023-10-20: 2 via RESPIRATORY_TRACT

## 2023-10-20 MED ORDER — PREDNISONE 20 MG PO TABS: 40.0000 mg | ORAL_TABLET | Freq: Every day | ORAL | 0 refills | Status: AC

## 2023-10-20 MED ORDER — PROMETHAZINE-DM 6.25-15 MG/5ML PO SYRP: 5.0000 mL | ORAL_SOLUTION | Freq: Four times a day (QID) | ORAL | 0 refills | Status: DC | PRN

## 2023-10-20 MED ORDER — BENZONATATE 100 MG PO CAPS: 100.0000 mg | ORAL_CAPSULE | Freq: Three times a day (TID) | ORAL | 0 refills | Status: DC | PRN

## 2023-10-20 MED ORDER — ALBUTEROL SULFATE HFA 108 (90 BASE) MCG/ACT IN AERS
INHALATION_SPRAY | RESPIRATORY_TRACT | Status: AC
  Filled 2023-10-20: qty 6.7

## 2023-10-20 NOTE — ED Provider Notes (Signed)
[facility]    CSN: [id number] Arrival date & time: 10/20/23  0802      History   Chief Complaint Chief Complaint  Patient presents with   Cough   Generalized Body Aches   Chills   Nasal Congestion    HPI Daniel Joseph is a 63 y.o. male.  1 week history of productive cough, nasal congestion, chills and body aches. No known fever Denies shortness of breath but feels some wheeze/rattle in the chest Has used coricidin, tylenol, and mucinex Possible sick contact  History of hypoxic respiratory failure, CHF, hypertensive cardiomyopathy, CKD  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, PASP 79   Gout    H/O atrial flutter 05/2020   but back to NSR   Mitral valve regurgitation    mild to moderate MR with MVP of ANMVL by echo 2019   PVC's (premature ventricular contractions)    nonsustained VT as well as PVCs - no ICD indicated due to EF 40-45%   Sleep apnea    pending second sleep study    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    Patient Active Problem List   Diagnosis Date Noted   Adenopathy 03/14/2021   Neuropathy 02/02/2020   Nausea 02/02/2020   Acute exacerbation of CHF (congestive heart failure) (HCC) 08/29/2019   Alcohol abuse 08/29/2019   Hypoxic Respiratory failure, acute (HCC) 08/29/2019   PVC's (premature ventricular contractions)    HTN (hypertension) 03/02/2014   Chronic combined systolic and diastolic CHF (congestive heart failure) (HCC) 03/02/2014   Hypertensive  cardiomyopathy (HCC) 03/02/2014   CKD (chronic kidney disease), stage III (HCC) 02/15/2014   Neck nodule 02/01/2014   Eye redness 02/01/2014   Mitral valve regurgitation 01/21/2014   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    Past Surgical History:  Procedure Laterality Date   ABDOMINAL SURGERY  at birth   BRONCHIAL NEEDLE ASPIRATION BIOPSY  03/29/2021   Procedure: BRONCHIAL NEEDLE ASPIRATION BIOPSIES;  Surgeon: Josephine Igo, DO;  [facility]: [facility];  Service: Pulmonary;;   BUBBLE STUDY  03/16/2019   Procedure: BUBBLE STUDY;  Surgeon: Laurey Morale, MD;  Location: [facility] ENDOSCOPY;  Service: Cardiovascular;;   CARDIAC CATHETERIZATION     CARDIAC VALVE REPLACEMENT     CARDIOVERSION N/A 06/08/2020   Procedure: CARDIOVERSION;  Surgeon: Laurey Morale, MD;  Location: [facility] ENDOSCOPY;  Service: Cardiovascular;  Laterality: N/A;   RIGHT/LEFT HEART CATH AND CORONARY ANGIOGRAPHY N/A 03/16/2019   Procedure: RIGHT/LEFT HEART CATH AND CORONARY ANGIOGRAPHY;  Surgeon: Laurey Morale, MD;  [facility]: [facility];  Service: Cardiovascular;  Laterality: N/A;   RIGHT/LEFT HEART CATH AND CORONARY ANGIOGRAPHY N/A 11/09/2020   Procedure: RIGHT/LEFT HEART CATH AND CORONARY ANGIOGRAPHY;  Surgeon: Laurey Morale, MD;  Location: [facility] INVASIVE CV LAB;  Service: Cardiovascular;  Laterality: N/A;   TEE WITHOUT CARDIOVERSION N/A 01/25/2014   Procedure: TRANSESOPHAGEAL ECHOCARDIOGRAM (TEE);  Surgeon: Vesta Mixer, MD;  Location: [facility]  ENDOSCOPY;  Service: Cardiovascular;  Laterality: N/A;   TEE WITHOUT CARDIOVERSION N/A 09/14/2014   Procedure: TRANSESOPHAGEAL ECHOCARDIOGRAM (TEE);  Surgeon: Quintella Reichert, MD;  Location: [facility] ENDOSCOPY;  Service: Cardiovascular;  Laterality: N/A;   TEE WITHOUT CARDIOVERSION N/A 03/16/2019   Procedure: TRANSESOPHAGEAL ECHOCARDIOGRAM (TEE);  Surgeon: Laurey Morale, MD;  Location: [facility] ENDOSCOPY;  Service: Cardiovascular;  Laterality: N/A;   TEE WITHOUT CARDIOVERSION N/A  06/08/2020   Procedure: TRANSESOPHAGEAL ECHOCARDIOGRAM (TEE);  Surgeon: Laurey Morale, MD;  Location: [facility] ENDOSCOPY;  Service: Cardiovascular;  Laterality: N/A;   VIDEO BRONCHOSCOPY WITH ENDOBRONCHIAL ULTRASOUND N/A 03/29/2021   Procedure: VIDEO BRONCHOSCOPY WITH ENDOBRONCHIAL ULTRASOUND;  Surgeon: Josephine Igo, DO;  Location: [facility];  Service: Pulmonary;  Laterality: N/A;       Home Medications    Prior to Admission medications   Medication Sig Start Date End Date Taking? Authorizing Provider  benzonatate (TESSALON) 100 MG capsule Take 1 capsule (100 mg total) by mouth 3 (three) times daily as needed for cough. 10/20/23  Yes Arsenia Goracke, Lurena Joiner, PA-C  predniSONE (DELTASONE) 20 MG tablet Take 2 tablets (40 mg total) by mouth daily with breakfast for 5 days. 10/20/23 10/25/23 Yes Lizvette Lightsey, Lurena Joiner, PA-C  promethazine-dextromethorphan (PROMETHAZINE-DM) 6.25-15 MG/5ML syrup Take 5 mLs by mouth 4 (four) times daily as needed for cough. 10/20/23  Yes Lakena Sparlin, Lurena Joiner, PA-C  acetaminophen (TYLENOL) 650 MG CR tablet Take 1,300 mg by mouth every 8 (eight) hours as needed for pain.    [provider]  allopurinol (ZYLOPRIM) 100 MG tablet Take 200 mg by mouth every morning.    [provider]  amLODipine (NORVASC) 10 MG tablet TAKE 1 TABLET (10 MG TOTAL) BY MOUTH DAILY WITH LUNCH (NOON) 11/11/22   Laurey Morale, MD  apixaban (ELIQUIS) 5 MG TABS tablet Take 1 tablet (5 mg total) by mouth 2 (two) times daily. NEEDS FOLLOW UP APPOINTMENT FOR MORE REFILLS 07/30/23   Laurey Morale, MD  aspirin EC 81 MG tablet Take by mouth. 04/01/14   [provider]  benazepril (LOTENSIN) 40 MG tablet Take by mouth. 02/12/17   [provider]  bisacodyl (DULCOLAX) 5 MG EC tablet Take 1 tablet (5 mg total) by mouth daily as needed for moderate constipation. 03/23/19   Charlie Pitter III, MD  carvedilol (COREG) 25 MG tablet TAKE 1 & 1/2 TABLETS (37.5 MG TOTAL) BY MOUTH 2 (TWO) TIMES DAILY  WITH A MEAL. NEEDS FOLLOW UP APPOINTMENT FOR ANYMORE REFILLS 05/22/23   Laurey Morale, MD  cloNIDine (CATAPRES) 0.1 MG tablet Take 1 tablet by mouth 2 (two) times daily.    [provider]  cyclobenzaprine (FLEXERIL) 10 MG tablet Take 1 tablet (10 mg total) by mouth 2 (two) times daily as needed for muscle spasms. 07/02/23   Marisa Cyphers, MD  furosemide (LASIX) 40 MG tablet TAKE 1 TABLET IN THE MORNING AND 1/2 TABLET IN THE EVERY. NEEDS FOLLOW UP APPOINTMENT FOR ANYMORE REFILLS 07/30/23   Laurey Morale, MD  hydrALAZINE (APRESOLINE) 50 MG tablet TAKE 1 TABLET (50 MG TOTAL) BY MOUTH 3 (THREE) TIMES DAILY(AM+NOON+EVENING) 05/22/23   Laurey Morale, MD  isosorbide mononitrate (IMDUR) 60 MG 24 hr tablet TAKE 1 & 1/2 TABLET (90 MG TOTAL) BY MOUTH DAILY (AM) 10/08/22   Laurey Morale, MD  losartan (COZAAR) 100 MG tablet TAKE 1 TABLET BY MOUTH IN THE MORNING AND 1/2 TABLET EVERY EVENING. 11/11/22   Laurey Morale, MD  meloxicam (MOBIC) 7.5 MG  tablet Take 1 tablet by mouth daily. 12/14/20   [provider]  metoprolol tartrate (LOPRESSOR) 50 MG tablet take 2 tablets (100 mg) by oral route 2 times per day    [provider]  Multiple Vitamin (MULTIVITAMIN WITH MINERALS) TABS tablet Take 1 tablet by mouth daily. 09/02/19   Sheth, Caren Macadam, MD  rosuvastatin (CRESTOR) 10 MG tablet Take 1 tablet (10 mg total) by mouth daily. NEEDS FOLLOW UP APPOINTMENT FOR MORE REFILLS 07/04/23   Laurey Morale, MD  spironolactone (ALDACTONE) 50 MG tablet TAKE 1 TABLET (50 MG TOTAL) BY MOUTH DAILY (AM) 10/08/22   Laurey Morale, MD    Family History Family History  Problem Relation Age of Onset   Heart Problems Mother    Stroke Mother    Heart failure Mother    Hypertension Mother    Heart Problems Father    Heart attack Father    Hypertension Father    Heart Problems Brother    Heart attack Brother    Hypertension Brother    Colon cancer Neg Hx    Esophageal cancer Neg Hx     Stomach cancer Neg Hx    Colon polyps Neg Hx    Rectal cancer Neg Hx     Social History Social History   Tobacco Use   Smoking status: Never   Smokeless tobacco: Never  Vaping Use   Vaping status: Never Used  Substance Use Topics   Alcohol use: Not Currently   Drug use: Never     Allergies   Entresto [sacubitril-valsartan] and Benazepril   Review of Systems Review of Systems  Respiratory:  Positive for cough.    Per HPI  Physical Exam Triage Vital Signs ED Triage Vitals  Encounter Vitals Group     BP 10/20/23 0823 (!) 142/105     Systolic BP Percentile --      Diastolic BP Percentile --      Pulse --      Resp --      Temp 10/20/23 0823 98.1 F (36.7 C)     Temp Source 10/20/23 0823 Oral     SpO2 --      Weight --      Height --      Head Circumference --      Peak Flow --      Pain Score 10/20/23 0818 6     Pain Loc --      Pain Education --      Exclude from Growth Chart --    No data found.  Updated Vital Signs BP (!) 142/105 (BP Location: Right Arm)   Pulse 87   Temp 98.1 F (36.7 C) (Oral)   SpO2 97%   Physical Exam Vitals and nursing note reviewed.  Constitutional:      General: He is not in acute distress. HENT:     Right Ear: Tympanic membrane and ear canal normal.     Left Ear: Tympanic membrane and ear canal normal.     Nose: No rhinorrhea.     Mouth/Throat:     Mouth: Mucous membranes are moist.     Pharynx: Oropharynx is clear. No posterior oropharyngeal erythema.  Eyes:     Conjunctiva/sclera: Conjunctivae normal.  Cardiovascular:     Rate and Rhythm: Normal rate and regular rhythm.     Pulses: Normal pulses.     Heart sounds: Normal heart sounds.  Pulmonary:     Effort: Pulmonary effort is normal.  Breath sounds: Wheezing present.     Comments: Wheezing throughout. Speaks in full sentences  Musculoskeletal:     Cervical back: Normal range of motion.     Right lower leg: No edema.     Left lower leg: No edema.   Lymphadenopathy:     Cervical: No cervical adenopathy.  Skin:    General: Skin is warm and dry.  Neurological:     Mental Status: He is alert and oriented to person, place, and time.     UC Treatments / Results  Labs (all labs ordered are listed, but only abnormal results are displayed) Labs Reviewed - No data to display  EKG   Radiology DG Chest 2 View Result Date: 10/20/2023 CLINICAL DATA:  1 week history of cough. EXAM: CHEST - 2 VIEW COMPARISON:  05/05/2021 FINDINGS: The lungs are clear without focal pneumonia, edema, pneumothorax or pleural effusion. Cardiopericardial silhouette is at upper limits of normal for size. Status post cardiac valve replacement. No acute bony abnormality. IMPRESSION: No active cardiopulmonary disease. Electronically Signed   By: Kennith Center M.D.   On: 10/20/2023 08:51    Procedures Procedures (including critical care time)  Medications Ordered in UC Medications  albuterol (VENTOLIN HFA) 108 (90 Base) MCG/ACT inhaler 2 puff (2 puffs Inhalation Given 10/20/23 0909)    Initial Impression / Assessment and Plan / UC Course  I have reviewed the triage vital signs and the nursing notes.  Pertinent labs & imaging results that were available during my care of the patient were reviewed by me and considered in my medical decision making (see chart for details).  Afebrile, sating 97% room air CXR is negative No signs of fluid overload 2 puffs albuterol in clinic, advised using 2-3 times daily for next several days. Prednisone burst. Can also try promethazine and tessalon. Advised return precautions. Patient is agreeable to plan, no questions at this time  Final Clinical Impressions(s) / UC Diagnoses   Final diagnoses:  Acute cough  Viral upper respiratory tract infection  Wheezing     Discharge Instructions      Please use the inhaler 3 times daily (every 6 hours) for the next several days. Then continue as needed.  Prednisone 40 mg daily for  the next 5 days  You can try both the tessalon (cough pills) and the promethazine DM (cough syrup). Please note they might make you drowsy. Otherwise you can use 3 times a day  If no improvement after 5 days, please return      ED Prescriptions     Medication Sig Dispense Auth. Provider   predniSONE (DELTASONE) 20 MG tablet Take 2 tablets (40 mg total) by mouth daily with breakfast for 5 days. 10 tablet Daysi Boggan, PA-C   benzonatate (TESSALON) 100 MG capsule Take 1 capsule (100 mg total) by mouth 3 (three) times daily as needed for cough. 30 capsule Brailyn Killion, PA-C   promethazine-dextromethorphan (PROMETHAZINE-DM) 6.25-15 MG/5ML syrup Take 5 mLs by mouth 4 (four) times daily as needed for cough. 240 mL Zahari Xiang, Lurena Joiner, PA-C      PDMP not reviewed this encounter.   Marlow Baars, [place] 10/20/23 [phone]

## 2023-10-20 NOTE — ED Triage Notes (Signed)
Patient reports that he has had a productive cough with yellow sputum, body aches, chills, and nasal congestion x 1 week.  Patient states he has been taking Coricidin and Tylenol which he took at 0700 this AM.

## 2023-10-20 NOTE — Discharge Instructions (Addendum)
Please use the inhaler 3 times daily (every 6 hours) for the next several days. Then continue as needed.  Prednisone 40 mg daily for the next 5 days  You can try both the tessalon (cough pills) and the promethazine DM (cough syrup). Please note they might make you drowsy. Otherwise you can use 3 times a day  If no improvement after 5 days, please return

## 2023-11-17 ENCOUNTER — Other Ambulatory Visit (HOSPITAL_COMMUNITY): Payer: Self-pay | Admitting: Cardiology

## 2023-11-18 ENCOUNTER — Other Ambulatory Visit (HOSPITAL_COMMUNITY): Payer: Self-pay | Admitting: Cardiology

## 2023-11-18 DIAGNOSIS — I5042 Chronic combined systolic (congestive) and diastolic (congestive) heart failure: Secondary | ICD-10-CM

## 2023-11-18 DIAGNOSIS — I4892 Unspecified atrial flutter: Secondary | ICD-10-CM

## 2023-11-20 ENCOUNTER — Other Ambulatory Visit (HOSPITAL_COMMUNITY): Payer: Self-pay | Admitting: Cardiology

## 2023-12-01 NOTE — H&P (View-Only) (Signed)
 Advanced Heart Failure Clinic Note   PCP: Quentin Angst, MD Cardiology: Dr. Mayford Knife HF Cardiology: Dr. Shirlee Latch  Chief Complaint: Heart Failure Follow-up HPI: Daniel Joseph is a 63 y.o. male with history of cardiac sarcoidosis, CHF, resistant HTN, tricuspid valve fibroelastoma, and mitral regurgitation s/p MVR at [facility] in 2022.  TEE 12/15 showing EF 40-45%. Cardiolite in 5/16 no ischemia but possible apical infarction. Echo in 5/20 EF still 40-45% with possible severe MR, severe pulmonary hypertension, and unchanged TV mass (suspected fibroelastoma).  PYP in 2020 not suggestive of TTR amyloid.  R/LHC 2020: nonobstructive CAD.   cMRI 1/22: LVEF 45%, gHK worse in the basal-mid inferior and inferoseptal walls, RVEF 54%, severe MR. Concerning for possible cardiac sarcoidosis.    LHC/RHC in 2/22 showed 50% mLAD stenosis, and 80% dLAD.  High res CT chest and cardiac PET 2022 concerning for possible cardiac (+ active inflammation) and pulmonary sarcoidosis (bronchoscopy negative). Echo in 5/22 showed EF 50-55%, moderate LV enlargement, low normal RV function, moderate RV enlargement, moderate-severe MR.  Seen in the sarcoid clinic at [facility] by Dr. Jeralyn Ruths. He was suspected to have "burned out" sarcoid with minimal activity. S/P MV repair/TV repair/Maze at [facility] in 8/22.   Most recent echo post-op in 8/22: EF > 55%, moderate LVH, normal RV, s/p MV repair with mean gradient 3 and no MR, s/p TV repair with mean gradient 1 and mild TR, PASP 57 mmHg.   Has not been seen in AHF Clinic since 06/2022. He was overall doing well at the time. Weight was 230lb. He had not been started on suppressive therapy.  Today he returns for HF follow up. Recent ED visit for cough, he was given prednisone burst and albuterol, much improved.  NYHA I-II. Denies increasing SOB, palpitations, abnormal bleeding, CP, dizziness, edema, or PND/Orthopnea. Appetite ok. No fever or chills. Taking all medications. BP at home  140/100. Noticed that he has gained around 10 lb in 2-3 months. In AF with RVR on ECG, asymptomatic.  FH:  Father with MI at 57, brother with MI at 51, mother with CHF, MI.  HTN in multiple family members.   Social History   Socioeconomic History   Marital status: Divorced    Spouse name: Not on file   Number of children: Not on file   Years of education: 12   Highest education level: High school graduate  Occupational History   Occupation: Disabled  Tobacco Use   Smoking status: Never   Smokeless tobacco: Never  Vaping Use   Vaping status: Never Used  Substance and Sexual Activity   Alcohol use: Not Currently   Drug use: Never   Sexual activity: Yes  Other Topics Concern   Not on file  Social History Narrative   Not on file   Social Drivers of Health   Financial Resource Strain: Not on File (01/03/2022)   Received from [employer], [employer]    Financial Resource Strain: 0  Food Insecurity: Not on File (06/12/2023)   Received from [facility] Insecurity    Food: 0  Transportation Needs: Not on File (01/03/2022)   Received from [employer], [employer] Needs    Transportation: 0  Physical Activity: Not on File (01/03/2022)   Received from [place], [place]   Physical Activity    Physical Activity: 0  Stress: Not on File (01/03/2022)   Received from [facility], [place]   Stress    Stress: 0  Social Connections: Not  on File (06/12/2023)   Received from [facility]   Social Connections    Connectedness: 0  Intimate Partner Violence: Not on file   ROS: All systems reviewed and negative except as per HPI.   Current Outpatient Medications  Medication Sig Dispense Refill   acetaminophen (TYLENOL) 650 MG CR tablet Take 1,300 mg by mouth every 8 (eight) hours as needed for pain.     allopurinol (ZYLOPRIM) 100 MG tablet TAKE 2 TABLETS BY MOUTH ONCE DAILY (AM) 180 tablet 0   amLODipine (NORVASC) 10 MG tablet TAKE 1 TABLET (10 MG TOTAL) BY MOUTH DAILY WITH LUNCH  (NOON) 30 tablet 0   aspirin EC 81 MG tablet Take by mouth.     benazepril (LOTENSIN) 40 MG tablet Take 40 mg by mouth daily.     benzonatate (TESSALON) 100 MG capsule Take 1 capsule (100 mg total) by mouth 3 (three) times daily as needed for cough. 30 capsule 0   bisacodyl (DULCOLAX) 5 MG EC tablet Take 1 tablet (5 mg total) by mouth daily as needed for moderate constipation. 4 tablet 0   carvedilol (COREG) 25 MG tablet TAKE 1 & 1/2 TABLETS (37.5 MG TOTAL) BY MOUTH 2 (TWO) TIMES DAILY WITH A MEAL. 90 tablet 0   cloNIDine (CATAPRES) 0.1 MG tablet Take 1 tablet by mouth 2 (two) times daily.     cyclobenzaprine (FLEXERIL) 10 MG tablet Take 1 tablet (10 mg total) by mouth 2 (two) times daily as needed for muscle spasms. 30 tablet 0   ELIQUIS 5 MG TABS tablet TAKE 1 TABLET (5 MG TOTAL) BY MOUTH 2 (TWO) TIMES DAILY (AM+PM) 60 tablet 0   furosemide (LASIX) 40 MG tablet TAKE 1 TABLET (40 MG TOTAL) BY MOUTH EVERY MORNING AND 1/2 TABLET (20 MG TOTAL) EVERY EVENING. 45 tablet 0   hydrALAZINE (APRESOLINE) 50 MG tablet TAKE 1 TABLET (50 MG TOTAL) BY MOUTH 3 (THREE) TIMES DAILY(AM+NOON+EVENING) 90 tablet 0   isosorbide mononitrate (IMDUR) 60 MG 24 hr tablet TAKE 1 & 1/2 TABLET (90 MG TOTAL) BY MOUTH DAILY (AM) 45 tablet 0   losartan (COZAAR) 100 MG tablet TAKE 1 TABLET BY MOUTH IN THE MORNING AND 1/2 TABLET EVERY EVENING(PM) 45 tablet 0   meloxicam (MOBIC) 7.5 MG tablet Take 1 tablet by mouth daily.     metoprolol tartrate (LOPRESSOR) 50 MG tablet take 2 tablets (100 mg) by oral route 2 times per day     Multiple Vitamin (MULTIVITAMIN WITH MINERALS) TABS tablet Take 1 tablet by mouth daily. 30 tablet 0   promethazine-dextromethorphan (PROMETHAZINE-DM) 6.25-15 MG/5ML syrup Take 5 mLs by mouth 4 (four) times daily as needed for cough. 240 mL 0   rosuvastatin (CRESTOR) 10 MG tablet TAKE 1 TABLET (10 MG TOTAL) BY MOUTH DAILY. (PM) 30 tablet 0   spironolactone (ALDACTONE) 50 MG tablet TAKE 1 TABLET (50 MG TOTAL) BY  MOUTH DAILY (AM) 30 tablet 0   No current facility-administered medications for this encounter.   Wt Readings from Last 3 Encounters:  12/08/23 106.2 kg (234 lb 3.2 oz)  09/09/23 102.1 kg (225 lb)  10/21/22 103 kg (227 lb)   BP (!) 108/94   Pulse (!) 131   Wt 106.2 kg (234 lb 3.2 oz)   SpO2 99%   BMI 35.61 kg/m   ECG: Atrial Flutter 122 bpm and 1 PVC (personally reviewed)  Physical Exam General: Well appearing. No distress on RA Cardiac: JVP ~6-7cm. S1 and S2 present. No murmurs or rub. Abdomen: Soft,  non-tender, non-distended. Extremities: Warm and dry. No peripheral edema.  Neuro: Alert and oriented x3. Affect pleasant. Moves all extremities without difficulty.  Assessment/Plan: 1. Chronic HFmrEF: NICM. HTN may play a role in CM. Strong suspicion of cardiac sarcoid (see below). LHC/RHC 2022 showed moderate CAD without interventional target. Echo 8/22: EF > 55%, moderate LVH, normal RV, no MR s/p MVR.  - Narrow pulse pressure in clinic today. Report BP at home 130/90-100. Warm, dry on exam. No clinical symptoms of low output.  - He is not volume overloaded on exam, NYHA class I-II.  - Continue Lasix 40 qam/20 qpm.  BMET/BNP today - Continue losartan 100 qam/50 qpm (does not tolerate Entresto).   - Continue Coreg 37.5 mg bid as above.  - Continue spironolactone 50 mg daily.  - Continue hydralazine 50 mg tid and Imdur 90 mg daily.  - Needs updated echo, in AFutter RVR today. Will schedule DC-CV with Dr. Shirlee Latch. Schedule echo at next follow up.   2. Cardiac sarcoidosis: Strong suspicion for cardiac sarcoidosis based on cPET and cMRI. Has not underwent myocardial biopsy. Bronchoscopy with biopsy in 7/22 was negative for sarcoidosis. Seen by Dr. Jeralyn Ruths at the [facility] cardiac sarcoidosis clinic, it was thought that sarcoid is not particularly active (possible "burned out" sarcoidosis).   - Now has new insurance, needs a new PA for repeat cPET scan. Will need to guide treatment. If  inflammation has increased, would plan immunosuppression. If inflammation is stable/decreased, no treatment. 3. Mitral regurgitation: Mod to severe since 5/20. TEE in 9/21 with moderate-severe MR, A2 prolapse. In 8/22, s/p MV and TV repairs with Maze at [facility]. Post-op echo showed stable repaired MV in 8/22.  - Will need endocarditis prophylaxis with dental work.  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  5. CAD: Moderate CAD without interventional target on 2/22 cath, no chest pain.  - Continue Crestor, good lipids in 7/23 - He is on apixaban, so no ASA.  6. Atrial flutter/fibrillation: S/p Maze with MV/TV repairs. ECG showed Aflutter in 120-130s. Asymptomatic. Discussed need for ED evaluation should he develop s/s at home.  - Continue Eliquis 5 mg bid. Has not missed any doses. - Restart amiodarone 400 mg bid - Will schedule DC-CV with Dr. Shirlee Latch. 7. HTN: Negative renal artery dopplers and sleep study in past. BP stable today but generally controlled. No medication change today. - GDMT as above - Continue amlodipine  Uses prescription blister packs.  Follow-up in 2 weeks with NP/PA post DC-CV. (Will need to schedule echo).  [place] Brighten Orndoff, NP 12/08/2023

## 2023-12-01 NOTE — Progress Notes (Signed)
 Advanced Heart Failure Clinic Note   PCP: Quentin Angst, MD Cardiology: Dr. Mayford Knife HF Cardiology: Dr. Shirlee Latch  Chief Complaint: Heart Failure Follow-up HPI: Daniel Joseph is a 63 y.o. male with history of cardiac sarcoidosis, CHF, resistant HTN, tricuspid valve fibroelastoma, and mitral regurgitation s/p MVR at [facility] in 2022.  TEE 12/15 showing EF 40-45%. Cardiolite in 5/16 no ischemia but possible apical infarction. Echo in 5/20 EF still 40-45% with possible severe MR, severe pulmonary hypertension, and unchanged TV mass (suspected fibroelastoma).  PYP in 2020 not suggestive of TTR amyloid.  R/LHC 2020: nonobstructive CAD.   cMRI 1/22: LVEF 45%, gHK worse in the basal-mid inferior and inferoseptal walls, RVEF 54%, severe MR. Concerning for possible cardiac sarcoidosis.    LHC/RHC in 2/22 showed 50% mLAD stenosis, and 80% dLAD.  High res CT chest and cardiac PET 2022 concerning for possible cardiac (+ active inflammation) and pulmonary sarcoidosis (bronchoscopy negative). Echo in 5/22 showed EF 50-55%, moderate LV enlargement, low normal RV function, moderate RV enlargement, moderate-severe MR.  Seen in the sarcoid clinic at [facility] by Dr. Jeralyn Ruths. He was suspected to have "burned out" sarcoid with minimal activity. S/P MV repair/TV repair/Maze at [facility] in 8/22.   Most recent echo post-op in 8/22: EF > 55%, moderate LVH, normal RV, s/p MV repair with mean gradient 3 and no MR, s/p TV repair with mean gradient 1 and mild TR, PASP 57 mmHg.   Has not been seen in AHF Clinic since 06/2022. He was overall doing well at the time. Weight was 230lb. He had not been started on suppressive therapy.  Today he returns for HF follow up. Recent ED visit for cough, he was given prednisone burst and albuterol, much improved.  NYHA I-II. Denies increasing SOB, palpitations, abnormal bleeding, CP, dizziness, edema, or PND/Orthopnea. Appetite ok. No fever or chills. Taking all medications. BP at home  140/100. Noticed that he has gained around 10 lb in 2-3 months. In AF with RVR on ECG, asymptomatic.  FH:  Father with MI at 57, brother with MI at 51, mother with CHF, MI.  HTN in multiple family members.   Social History   Socioeconomic History   Marital status: Divorced    Spouse name: Not on file   Number of children: Not on file   Years of education: 12   Highest education level: High school graduate  Occupational History   Occupation: Disabled  Tobacco Use   Smoking status: Never   Smokeless tobacco: Never  Vaping Use   Vaping status: Never Used  Substance and Sexual Activity   Alcohol use: Not Currently   Drug use: Never   Sexual activity: Yes  Other Topics Concern   Not on file  Social History Narrative   Not on file   Social Drivers of Health   Financial Resource Strain: Not on File (01/03/2022)   Received from [employer], [employer]    Financial Resource Strain: 0  Food Insecurity: Not on File (06/12/2023)   Received from [facility] Insecurity    Food: 0  Transportation Needs: Not on File (01/03/2022)   Received from [employer], [employer] Needs    Transportation: 0  Physical Activity: Not on File (01/03/2022)   Received from [place], [place]   Physical Activity    Physical Activity: 0  Stress: Not on File (01/03/2022)   Received from [facility], [place]   Stress    Stress: 0  Social Connections: Not  on File (06/12/2023)   Received from [facility]   Social Connections    Connectedness: 0  Intimate Partner Violence: Not on file   ROS: All systems reviewed and negative except as per HPI.   Current Outpatient Medications  Medication Sig Dispense Refill   acetaminophen (TYLENOL) 650 MG CR tablet Take 1,300 mg by mouth every 8 (eight) hours as needed for pain.     allopurinol (ZYLOPRIM) 100 MG tablet TAKE 2 TABLETS BY MOUTH ONCE DAILY (AM) 180 tablet 0   amLODipine (NORVASC) 10 MG tablet TAKE 1 TABLET (10 MG TOTAL) BY MOUTH DAILY WITH LUNCH  (NOON) 30 tablet 0   aspirin EC 81 MG tablet Take by mouth.     benazepril (LOTENSIN) 40 MG tablet Take 40 mg by mouth daily.     benzonatate (TESSALON) 100 MG capsule Take 1 capsule (100 mg total) by mouth 3 (three) times daily as needed for cough. 30 capsule 0   bisacodyl (DULCOLAX) 5 MG EC tablet Take 1 tablet (5 mg total) by mouth daily as needed for moderate constipation. 4 tablet 0   carvedilol (COREG) 25 MG tablet TAKE 1 & 1/2 TABLETS (37.5 MG TOTAL) BY MOUTH 2 (TWO) TIMES DAILY WITH A MEAL. 90 tablet 0   cloNIDine (CATAPRES) 0.1 MG tablet Take 1 tablet by mouth 2 (two) times daily.     cyclobenzaprine (FLEXERIL) 10 MG tablet Take 1 tablet (10 mg total) by mouth 2 (two) times daily as needed for muscle spasms. 30 tablet 0   ELIQUIS 5 MG TABS tablet TAKE 1 TABLET (5 MG TOTAL) BY MOUTH 2 (TWO) TIMES DAILY (AM+PM) 60 tablet 0   furosemide (LASIX) 40 MG tablet TAKE 1 TABLET (40 MG TOTAL) BY MOUTH EVERY MORNING AND 1/2 TABLET (20 MG TOTAL) EVERY EVENING. 45 tablet 0   hydrALAZINE (APRESOLINE) 50 MG tablet TAKE 1 TABLET (50 MG TOTAL) BY MOUTH 3 (THREE) TIMES DAILY(AM+NOON+EVENING) 90 tablet 0   isosorbide mononitrate (IMDUR) 60 MG 24 hr tablet TAKE 1 & 1/2 TABLET (90 MG TOTAL) BY MOUTH DAILY (AM) 45 tablet 0   losartan (COZAAR) 100 MG tablet TAKE 1 TABLET BY MOUTH IN THE MORNING AND 1/2 TABLET EVERY EVENING(PM) 45 tablet 0   meloxicam (MOBIC) 7.5 MG tablet Take 1 tablet by mouth daily.     metoprolol tartrate (LOPRESSOR) 50 MG tablet take 2 tablets (100 mg) by oral route 2 times per day     Multiple Vitamin (MULTIVITAMIN WITH MINERALS) TABS tablet Take 1 tablet by mouth daily. 30 tablet 0   promethazine-dextromethorphan (PROMETHAZINE-DM) 6.25-15 MG/5ML syrup Take 5 mLs by mouth 4 (four) times daily as needed for cough. 240 mL 0   rosuvastatin (CRESTOR) 10 MG tablet TAKE 1 TABLET (10 MG TOTAL) BY MOUTH DAILY. (PM) 30 tablet 0   spironolactone (ALDACTONE) 50 MG tablet TAKE 1 TABLET (50 MG TOTAL) BY  MOUTH DAILY (AM) 30 tablet 0   No current facility-administered medications for this encounter.   Wt Readings from Last 3 Encounters:  12/08/23 106.2 kg (234 lb 3.2 oz)  09/09/23 102.1 kg (225 lb)  10/21/22 103 kg (227 lb)   BP (!) 108/94   Pulse (!) 131   Wt 106.2 kg (234 lb 3.2 oz)   SpO2 99%   BMI 35.61 kg/m   ECG: Atrial Flutter 122 bpm and 1 PVC (personally reviewed)  Physical Exam General: Well appearing. No distress on RA Cardiac: JVP ~6-7cm. S1 and S2 present. No murmurs or rub. Abdomen: Soft,  non-tender, non-distended. Extremities: Warm and dry. No peripheral edema.  Neuro: Alert and oriented x3. Affect pleasant. Moves all extremities without difficulty.  Assessment/Plan: 1. Chronic HFmrEF: NICM. HTN may play a role in CM. Strong suspicion of cardiac sarcoid (see below). LHC/RHC 2022 showed moderate CAD without interventional target. Echo 8/22: EF > 55%, moderate LVH, normal RV, no MR s/p MVR.  - Narrow pulse pressure in clinic today. Report BP at home 130/90-100. Warm, dry on exam. No clinical symptoms of low output.  - He is not volume overloaded on exam, NYHA class I-II.  - Continue Lasix 40 qam/20 qpm.  BMET/BNP today - Continue losartan 100 qam/50 qpm (does not tolerate Entresto).   - Continue Coreg 37.5 mg bid as above.  - Continue spironolactone 50 mg daily.  - Continue hydralazine 50 mg tid and Imdur 90 mg daily.  - Needs updated echo, in AFutter RVR today. Will schedule DC-CV with Dr. Shirlee Latch. Schedule echo at next follow up.   2. Cardiac sarcoidosis: Strong suspicion for cardiac sarcoidosis based on cPET and cMRI. Has not underwent myocardial biopsy. Bronchoscopy with biopsy in 7/22 was negative for sarcoidosis. Seen by Dr. Jeralyn Ruths at the [facility] cardiac sarcoidosis clinic, it was thought that sarcoid is not particularly active (possible "burned out" sarcoidosis).   - Now has new insurance, needs a new PA for repeat cPET scan. Will need to guide treatment. If  inflammation has increased, would plan immunosuppression. If inflammation is stable/decreased, no treatment. 3. Mitral regurgitation: Mod to severe since 5/20. TEE in 9/21 with moderate-severe MR, A2 prolapse. In 8/22, s/p MV and TV repairs with Maze at [facility]. Post-op echo showed stable repaired MV in 8/22.  - Will need endocarditis prophylaxis with dental work.  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  5. CAD: Moderate CAD without interventional target on 2/22 cath, no chest pain.  - Continue Crestor, good lipids in 7/23 - He is on apixaban, so no ASA.  6. Atrial flutter/fibrillation: S/p Maze with MV/TV repairs. ECG showed Aflutter in 120-130s. Asymptomatic. Discussed need for ED evaluation should he develop s/s at home.  - Continue Eliquis 5 mg bid. Has not missed any doses. - Restart amiodarone 400 mg bid - Will schedule DC-CV with Dr. Shirlee Latch. 7. HTN: Negative renal artery dopplers and sleep study in past. BP stable today but generally controlled. No medication change today. - GDMT as above - Continue amlodipine  Uses prescription blister packs.  Follow-up in 2 weeks with NP/PA post DC-CV. (Will need to schedule echo).  [place] Brighten Orndoff, NP 12/08/2023

## 2023-12-05 ENCOUNTER — Telehealth (HOSPITAL_COMMUNITY): Payer: Self-pay

## 2023-12-05 NOTE — Telephone Encounter (Signed)
 Called to confirm/remind patient of their appointment at the Advanced Heart Failure Clinic on 12/08/23.   Appointment:   [x] Confirmed  [] Left mess   [] No answer/No voice mail  [] Phone not in service  Patient reminded to bring all medications and/or complete list.  Confirmed patient has transportation. Gave directions, instructed to utilize valet parking.

## 2023-12-08 ENCOUNTER — Encounter (HOSPITAL_COMMUNITY): Payer: Self-pay

## 2023-12-08 ENCOUNTER — Ambulatory Visit (HOSPITAL_COMMUNITY)
Admission: RE | Admit: 2023-12-08 | Discharge: 2023-12-08 | Disposition: A | Discharge status: Home / Self Care | Source: Ambulatory Visit | Attending: Cardiology | Admitting: Cardiology

## 2023-12-08 VITALS — BP 108/94 | HR 131 | Wt 234.2 lb

## 2023-12-08 DIAGNOSIS — I5042 Chronic combined systolic (congestive) and diastolic (congestive) heart failure: Secondary | ICD-10-CM

## 2023-12-08 DIAGNOSIS — I340 Nonrheumatic mitral (valve) insufficiency: Secondary | ICD-10-CM

## 2023-12-08 DIAGNOSIS — I2510 Atherosclerotic heart disease of native coronary artery without angina pectoris: Secondary | ICD-10-CM | POA: Insufficient documentation

## 2023-12-08 DIAGNOSIS — Z8249 Family history of ischemic heart disease and other diseases of the circulatory system: Secondary | ICD-10-CM | POA: Diagnosis not present

## 2023-12-08 DIAGNOSIS — Z79899 Other long term (current) drug therapy: Secondary | ICD-10-CM | POA: Insufficient documentation

## 2023-12-08 DIAGNOSIS — I10 Essential (primary) hypertension: Secondary | ICD-10-CM

## 2023-12-08 DIAGNOSIS — Z7901 Long term (current) use of anticoagulants: Secondary | ICD-10-CM | POA: Diagnosis not present

## 2023-12-08 DIAGNOSIS — I428 Other cardiomyopathies: Secondary | ICD-10-CM | POA: Diagnosis not present

## 2023-12-08 DIAGNOSIS — I4892 Unspecified atrial flutter: Secondary | ICD-10-CM | POA: Insufficient documentation

## 2023-12-08 DIAGNOSIS — I110 Hypertensive heart disease with heart failure: Secondary | ICD-10-CM | POA: Diagnosis not present

## 2023-12-08 DIAGNOSIS — I4891 Unspecified atrial fibrillation: Secondary | ICD-10-CM | POA: Diagnosis not present

## 2023-12-08 DIAGNOSIS — Z952 Presence of prosthetic heart valve: Secondary | ICD-10-CM | POA: Diagnosis not present

## 2023-12-08 DIAGNOSIS — I081 Rheumatic disorders of both mitral and tricuspid valves: Secondary | ICD-10-CM | POA: Insufficient documentation

## 2023-12-08 DIAGNOSIS — D8685 Sarcoid myocarditis: Secondary | ICD-10-CM | POA: Diagnosis not present

## 2023-12-08 DIAGNOSIS — I493 Ventricular premature depolarization: Secondary | ICD-10-CM | POA: Diagnosis not present

## 2023-12-08 DIAGNOSIS — I5022 Chronic systolic (congestive) heart failure: Secondary | ICD-10-CM | POA: Diagnosis present

## 2023-12-08 DIAGNOSIS — I4510 Unspecified right bundle-branch block: Secondary | ICD-10-CM | POA: Diagnosis not present

## 2023-12-08 LAB — BASIC METABOLIC PANEL
Anion gap: 8 (ref 5–15)
BUN: 15 mg/dL (ref 6–20)
CO2: 25 mmol/L (ref 22–32)
Calcium: 9.2 mg/dL (ref 8.9–10.3)
Chloride: 106 mmol/L (ref 98–111)
Creatinine, Ser: 1.3 mg/dL — ABNORMAL HIGH (ref 0.61–1.24)
GFR, Estimated: >60 mL/min (ref >60)
Glucose, Bld: 88 mg/dL (ref 70–99)
Potassium: 4.4 mmol/L (ref 3.5–5.1)
Sodium: 139 mmol/L (ref 135–145)

## 2023-12-08 LAB — BRAIN NATRIURETIC PEPTIDE: B Natriuretic Peptide: 231.6 pg/mL — ABNORMAL HIGH (ref 0.0–100.0)

## 2023-12-08 MED ORDER — AMIODARONE HCL 200 MG PO TABS: 400.0000 mg | ORAL_TABLET | Freq: Two times a day (BID) | ORAL | 6 refills | Status: DC

## 2023-12-08 NOTE — Patient Instructions (Addendum)
 Thank you for coming in today  If you had labs drawn today, any labs that are abnormal the clinic will call you No news is good news  Medications: Start Amiodarone 400 mg 2 tablets twice daily   You are scheduled for a TEE/Cardioversion/TEE Cardioversion on Thursday 12/25/2023 with Dr. Shirlee Latch.  Please arrive at the [facility] (Main Entrance A) at [facility]: [address] [place], [place] [address] at 6:30 am. (1 hour prior to procedure)  DIET: Nothing to eat or drink after midnight except a sip of water with medications (see medication instructions below)  Medication Instructions: Hold: furosemide, spironolactone  You must have a responsible person to drive you home and stay in the waiting area during your procedure. Failure to do so could result in cancellation.  Bring your insurance cards.  *Special Note: Every effort is made to have your procedure done on time. Occasionally there are emergencies that occur at the hospital that may cause delays. Please be patient if a delay does occur.      Follow up appointments:  Your physician recommends that you return for lab work in: 12/19/2023 for BMET CBC  Your physician recommends that you schedule a follow-up appointment in:  01/08/2024 at 11:30 am 2 weeks after cardioversion   Do the following things EVERYDAY: Weigh yourself in the morning before breakfast. Write it down and keep it in a log. Take your medicines as prescribed Eat low salt foods--Limit salt (sodium) to 2000 mg per day.  Stay as active as you can everyday Limit all fluids for the day to less than 2 liters   At the Advanced Heart Failure Clinic, you and your health needs are our priority. As part of our continuing mission to provide you with exceptional heart care, we have created designated Provider Care Teams. These Care Teams include your primary Cardiologist (physician) and Advanced Practice Providers (APPs- Physician Assistants and Nurse  Practitioners) who all work together to provide you with the care you need, when you need it.   You may see any of the following providers on your designated Care Team at your next follow up: Dr Arvilla Meres Dr Marca Ancona Dr. Marcos Eke, NP Robbie Lis, [place] [facility] [place], [place] Brynda Peon, NP Karle Plumber, PharmD   Please be sure to bring in all your medications bottles to every appointment.    Thank you for choosing [facility]  If you have any questions or concerns before your next appointment please send [place] a message through [place] or call our office at [phone].    TO LEAVE A MESSAGE FOR THE NURSE SELECT OPTION 2, PLEASE LEAVE A MESSAGE INCLUDING: YOUR NAME DATE OF BIRTH CALL BACK NUMBER REASON FOR CALL**this is important as we prioritize the call backs  YOU WILL RECEIVE A CALL BACK THE SAME DAY AS LONG AS YOU CALL BEFORE 4:00 PM

## 2023-12-19 ENCOUNTER — Ambulatory Visit (HOSPITAL_COMMUNITY)
Admission: RE | Admit: 2023-12-19 | Discharge: 2023-12-19 | Disposition: A | Discharge status: Home / Self Care | Source: Ambulatory Visit | Attending: Internal Medicine | Admitting: Internal Medicine

## 2023-12-19 DIAGNOSIS — D8685 Sarcoid myocarditis: Secondary | ICD-10-CM | POA: Insufficient documentation

## 2023-12-19 LAB — BASIC METABOLIC PANEL WITH GFR
Anion gap: 9 (ref 5–15)
BUN: 21 mg/dL — ABNORMAL HIGH (ref 6–20)
CO2: 24 mmol/L (ref 22–32)
Calcium: 8.8 mg/dL — ABNORMAL LOW (ref 8.9–10.3)
Chloride: 104 mmol/L (ref 98–111)
Creatinine, Ser: 1.74 mg/dL — ABNORMAL HIGH (ref 0.61–1.24)
GFR, Estimated: 44 mL/min — ABNORMAL LOW (ref >60)
Glucose, Bld: 85 mg/dL (ref 70–99)
Potassium: 4.1 mmol/L (ref 3.5–5.1)
Sodium: 137 mmol/L (ref 135–145)

## 2023-12-19 LAB — CBC
HCT: 39.2 % (ref 39.0–52.0)
Hemoglobin: 13.2 g/dL (ref 13.0–17.0)
MCH: 31.7 pg (ref 26.0–34.0)
MCHC: 33.7 g/dL (ref 30.0–36.0)
MCV: 94.2 fL (ref 80.0–100.0)
Platelets: 154 10*3/uL (ref 150–400)
RBC: 4.16 MIL/uL — ABNORMAL LOW (ref 4.22–5.81)
RDW: 14.1 % (ref 11.5–15.5)
WBC: 4.8 10*3/uL (ref 4.0–10.5)
nRBC: 0 % (ref 0.0–0.2)

## 2023-12-24 NOTE — Anesthesia Preprocedure Evaluation (Addendum)
 Anesthesia Evaluation  Patient identified by MRN, date of birth, ID band Patient awake    Reviewed: Allergy & Precautions, NPO status , Patient's Chart, lab work & pertinent test results, reviewed documented beta blocker date and time   Airway Mallampati: II  TM Distance: >3 FB Neck ROM: Full    Dental  (+) Dental Advisory Given, Poor Dentition, Missing, Chipped   Pulmonary sleep apnea    Pulmonary exam normal breath sounds clear to auscultation       Cardiovascular hypertension, Pt. on medications and Pt. on home beta blockers +CHF  + dysrhythmias Atrial Fibrillation + Valvular Problems/Murmurs MR and MVP  Rhythm:Regular Rate:Normal  ECHO     1. Left ventricular ejection fraction, by estimation, is 50 to 55%. The  left ventricle has mildly decreased function. The left ventricle  demonstrates global hypokinesis. The left ventricular internal cavity size  was moderately dilated. Left ventricular  diastolic parameters are consistent with Grade II diastolic dysfunction  (pseudonormalization). Elevated left atrial pressure. The average left  ventricular global longitudinal strain is -14.9 %. The global longitudinal  strain is abnormal.   2. Right ventricular systolic function is low normal. The right  ventricular size is moderately enlarged. There is normal pulmonary artery  systolic pressure.   3. Left atrial size was severely dilated.   4. Right atrial size was severely dilated.   5. The mitral valve is myxomatous. Severe mitral valve regurgitation. No  evidence of mitral stenosis. There is moderate late systolic prolapse of  the middle segment of the anterior leaflet of the mitral valve.   6. 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   7. The aortic valve is tricuspid. Aortic valve regurgitation is not  visualized. No aortic stenosis is present.   8. The inferior vena cava is  dilated in size with <50% respiratory  variability, suggesting right atrial pressure of 15 mmHg.     Neuro/Psych negative neurological ROS     GI/Hepatic negative GI ROS,,,(+)     substance abuse  alcohol use  Endo/Other  negative endocrine ROS    Renal/GU Renal InsufficiencyRenal disease     Musculoskeletal  (+) Arthritis ,    Abdominal   Peds  Hematology  (+) Blood dyscrasia (Eliquis)   Anesthesia Other Findings   Reproductive/Obstetrics                             Anesthesia Physical Anesthesia Plan  ASA: 3  Anesthesia Plan: General   Post-op Pain Management: Minimal or no pain anticipated   Induction: Intravenous  PONV Risk Score and Plan: 2 and Propofol infusion  Airway Management Planned: Natural Airway and Simple Face Mask  Additional Equipment:   Intra-op Plan:   Post-operative Plan:   Informed Consent: I have reviewed the patients History and Physical, chart, labs and discussed the procedure including the risks, benefits and alternatives for the proposed anesthesia with the patient or authorized representative who has indicated his/her understanding and acceptance.     Dental advisory given  Plan Discussed with: CRNA and Anesthesiologist  Anesthesia Plan Comments: ( )        Anesthesia Quick Evaluation

## 2023-12-25 ENCOUNTER — Other Ambulatory Visit: Payer: Self-pay

## 2023-12-25 ENCOUNTER — Ambulatory Visit (HOSPITAL_COMMUNITY): Payer: Self-pay | Admitting: Anesthesiology

## 2023-12-25 ENCOUNTER — Encounter (HOSPITAL_COMMUNITY)
Admission: RE | Disposition: A | Discharge status: Home / Self Care | Payer: Self-pay | Source: Ambulatory Visit | Attending: Cardiology

## 2023-12-25 ENCOUNTER — Ambulatory Visit (HOSPITAL_COMMUNITY)
Admission: RE | Admit: 2023-12-25 | Discharge: 2023-12-25 | Disposition: A | Discharge status: Home / Self Care | Source: Ambulatory Visit | Attending: Cardiology | Admitting: Cardiology

## 2023-12-25 DIAGNOSIS — G4733 Obstructive sleep apnea (adult) (pediatric): Secondary | ICD-10-CM | POA: Diagnosis not present

## 2023-12-25 DIAGNOSIS — I428 Other cardiomyopathies: Secondary | ICD-10-CM | POA: Insufficient documentation

## 2023-12-25 DIAGNOSIS — I5022 Chronic systolic (congestive) heart failure: Secondary | ICD-10-CM | POA: Diagnosis not present

## 2023-12-25 DIAGNOSIS — I2510 Atherosclerotic heart disease of native coronary artery without angina pectoris: Secondary | ICD-10-CM | POA: Diagnosis not present

## 2023-12-25 DIAGNOSIS — I110 Hypertensive heart disease with heart failure: Secondary | ICD-10-CM | POA: Diagnosis not present

## 2023-12-25 DIAGNOSIS — Z8249 Family history of ischemic heart disease and other diseases of the circulatory system: Secondary | ICD-10-CM | POA: Diagnosis not present

## 2023-12-25 DIAGNOSIS — I081 Rheumatic disorders of both mitral and tricuspid valves: Secondary | ICD-10-CM | POA: Diagnosis not present

## 2023-12-25 DIAGNOSIS — Z79899 Other long term (current) drug therapy: Secondary | ICD-10-CM | POA: Insufficient documentation

## 2023-12-25 DIAGNOSIS — I4891 Unspecified atrial fibrillation: Secondary | ICD-10-CM | POA: Insufficient documentation

## 2023-12-25 DIAGNOSIS — Z7901 Long term (current) use of anticoagulants: Secondary | ICD-10-CM | POA: Insufficient documentation

## 2023-12-25 DIAGNOSIS — I4892 Unspecified atrial flutter: Secondary | ICD-10-CM | POA: Diagnosis present

## 2023-12-25 DIAGNOSIS — I509 Heart failure, unspecified: Secondary | ICD-10-CM | POA: Diagnosis not present

## 2023-12-25 HISTORY — PX: CARDIOVERSION: EP1203

## 2023-12-25 SURGERY — CARDIOVERSION (CATH LAB)
Anesthesia: General

## 2023-12-25 MED ORDER — APIXABAN 5 MG PO TABS
ORAL_TABLET | ORAL | Status: AC
  Filled 2023-12-25: qty 1

## 2023-12-25 MED ORDER — SODIUM CHLORIDE 0.9% FLUSH: 3.0000 mL | Freq: Two times a day (BID) | INTRAVENOUS | Status: DC

## 2023-12-25 MED ORDER — SODIUM CHLORIDE 0.9% FLUSH: 3.0000 mL | INTRAVENOUS | Status: DC | PRN

## 2023-12-25 MED ORDER — PROPOFOL 10 MG/ML IV BOLUS
INTRAVENOUS | Status: DC | PRN
  Administered 2023-12-25: 60 mg via INTRAVENOUS

## 2023-12-25 MED ORDER — APIXABAN 5 MG PO TABS
5.0000 mg | ORAL_TABLET | Freq: Once | ORAL | Status: AC
  Administered 2023-12-25: 5 mg via ORAL

## 2023-12-25 SURGICAL SUPPLY — 1 items: PAD DEFIB RADIO PHYSIO CONN (PAD) ×1 IMPLANT

## 2023-12-25 NOTE — Procedures (Signed)
 Electrical Cardioversion Procedure Note Daniel Joseph [id number] May 19, 1961  Procedure: Electrical Cardioversion Indications:  Atrial Flutter  Procedure Details Consent: Risks of procedure as well as the alternatives and risks of each were explained to the (patient/caregiver).  Consent for procedure obtained. Time Out: Verified patient identification, verified procedure, site/side was marked, verified correct patient position, special equipment/implants available, medications/allergies/relevent history reviewed, required imaging and test results available.  Performed  Patient placed on cardiac monitor, pulse oximetry, supplemental oxygen as necessary.  Sedation given:  Propofol per anesthesiology Pacer pads placed anterior and posterior chest.  Cardioverted 1 time(s).  Cardioverted at 200J.  Evaluation Findings: Post procedure EKG shows: NSR Complications: None Patient did tolerate procedure well.   Marca Ancona 12/25/2023, 8:25 AM

## 2023-12-25 NOTE — Transfer of Care (Signed)
 Immediate Anesthesia Transfer of Care Note  Patient: Daniel Joseph  Procedure(s) Performed: CARDIOVERSION  Patient Location: PACU  Anesthesia Type:General  Level of Consciousness: awake, alert , and oriented  Airway & Oxygen Therapy: Patient Spontanous Breathing and Patient connected to face mask oxygen  Post-op Assessment: Report given to RN and Post -op Vital signs reviewed and stable  Post vital signs: Reviewed and stable  Last Vitals:  Vitals Value Taken Time  BP 138/97 12/25/23 0826  Temp 36.5 C 12/25/23 0826  Pulse 56 12/25/23 0826  Resp 16 12/25/23 0826  SpO2 100 % 12/25/23 0826    Last Pain:  Vitals:   12/25/23 0826  TempSrc: Tympanic  PainSc: Asleep         Complications: No notable events documented.

## 2023-12-25 NOTE — Discharge Instructions (Signed)

## 2023-12-25 NOTE — Interval H&P Note (Signed)
 History and Physical Interval Note:  12/25/2023 7:24 AM  Daniel Joseph  has presented today for surgery, with the diagnosis of AFLUTTER.  The various methods of treatment have been discussed with the patient and family. After consideration of risks, benefits and other options for treatment, the patient has consented to  Procedure(s): CARDIOVERSION (N/A) as a surgical intervention.  The patient's history has been reviewed, patient examined, no change in status, stable for surgery.  I have reviewed the patient's chart and labs.  Questions were answered to the patient's satisfaction.     Syrah Daughtrey [employer]

## 2023-12-25 NOTE — Anesthesia Postprocedure Evaluation (Signed)
 Anesthesia Post Note  Patient: Daniel Joseph  Procedure(s) Performed: CARDIOVERSION     Patient location during evaluation: PACU Anesthesia Type: General Level of consciousness: awake and alert Pain management: pain level controlled Vital Signs Assessment: post-procedure vital signs reviewed and stable Respiratory status: spontaneous breathing, nonlabored ventilation, respiratory function stable and patient connected to nasal cannula oxygen Cardiovascular status: blood pressure returned to baseline and stable Postop Assessment: no apparent nausea or vomiting Anesthetic complications: no   No notable events documented.  Last Vitals:  Vitals:   12/25/23 0836 12/25/23 0846  BP: (!) 129/96 (!) 132/98  Pulse: 63 63  Resp: 20 15  Temp:    SpO2: 96% 96%    Last Pain:  Vitals:   12/25/23 0846  TempSrc:   PainSc: 0-No pain                 Thanya Cegielski

## 2023-12-26 ENCOUNTER — Encounter (HOSPITAL_COMMUNITY): Payer: Self-pay | Admitting: Cardiology

## 2023-12-28 ENCOUNTER — Telehealth: Payer: Self-pay | Admitting: Cardiothoracic Surgery

## 2023-12-28 NOTE — Telephone Encounter (Signed)
 Received call from the patient announcing his intention to stop amiodarone due to diarrhea and blood seen in his stool.  Per chart review, recently underwent DCCV for atrial flutter.  Recommended to patient that instead of taking 400 bid which he is currently doing, that he drop to 200 bid, and see if the diarrhea improves.  Patient expressed willingness to try this

## 2023-12-29 NOTE — Telephone Encounter (Signed)
 Amiodarone could cause diarrhea, although so can a lot of other things. I would not expect amiodarone to be the cause of blood in his stool. But given that he is post cardioversion, neither Eliquis nor amioadrone should be stopped at this point

## 2023-12-30 NOTE — Addendum Note (Signed)
 Addended by: Sherryn Donalds on: 12/30/2023 03:54 PM   Modules accepted: Orders

## 2023-12-30 NOTE — Telephone Encounter (Signed)
 Spoke to patient Dr.Turner's advice given.He will continue Amiodarone 200 mg twice a day and Eliquis 5 mg twice a day.He will call PCP.

## 2024-01-06 NOTE — Progress Notes (Signed)
 Advanced Heart Failure Clinic Note   PCP: Jegede, Olugbemiga E, MD Cardiology: Dr. Micael Adas HF Cardiology: Dr. Mitzie Anda  Chief Complaint: Post Cardioversion Follow-up HPI: Daniel Joseph is a 63 y.o. male with history of cardiac sarcoidosis, CHF, resistant HTN, tricuspid valve fibroelastoma, and mitral regurgitation s/p MVR at [facility] in 2022.  TEE 12/15 showing EF 40-45%. Cardiolite  in 5/16 no ischemia but possible apical infarction. Echo in 5/20 EF still 40-45% with possible severe MR, severe pulmonary hypertension, and unchanged TV mass (suspected fibroelastoma).  PYP in 2020 not suggestive of TTR amyloid.  R/LHC 2020: nonobstructive CAD.   cMRI 1/22: LVEF 45%, gHK worse in the basal-mid inferior and inferoseptal walls, RVEF 54%, severe MR. Concerning for possible cardiac sarcoidosis.    LHC/RHC in 2/22 showed 50% mLAD stenosis, and 80% dLAD.  High res CT chest and cardiac PET 2022 concerning for possible cardiac (+ active inflammation) and pulmonary sarcoidosis (bronchoscopy negative). Echo in 5/22 showed EF 50-55%, moderate LV enlargement, low normal RV function, moderate RV enlargement, moderate-severe MR.  Seen in the sarcoid clinic at [facility] by Dr. America Just. He was suspected to have "burned out" sarcoid with minimal activity. S/P MV repair/TV repair/Maze at [facility] in 8/22.   Most recent echo post-op in 8/22: EF > 55%, moderate LVH, normal RV, s/p MV repair with mean gradient 3 and no MR, s/p TV repair with mean gradient 1 and mild TR, PASP 57 mmHg.   Has not been seen in AHF Clinic since 06/2022. He was overall doing well at the time. Weight was 230lb. He had not been started on suppressive therapy.  [facility] with AF RVR in 120-130s. Now s/p successful DCCV 12/25/23.   Today he returns for HF follow up. Recent ED visit for cough, he was given prednisone  burst and albuterol , much improved.  NYHA I-II. Denies increasing SOB, palpitations, abnormal bleeding, CP, dizziness,  edema, or PND/Orthopnea. Appetite ok. No fever or chills. Taking all medications. BP at home 140/100. Noticed that he has gained around 10 lb in 2-3 months. In AF with RVR on ECG, asymptomatic.    He returns today for post cardioversion follow up. Overall feeling ***. NYHA ***. Reports {Symptoms; cardiac:12860::"dyspnea","fatigue"}. Denies {Symptoms; cardiac:12860::"chest pain","dyspnea","fatigue","near-syncope","orthopnea","palpitations","dizziness","abnormal bleeding"}. Able to perform ADLs. Appetite okay. Weight at home ***. BP at home***. Compliant with all medications.     FH:  Father with MI at 3, brother with MI at 37, mother with CHF, MI.  HTN in multiple family members.   Social History   Socioeconomic History   Marital status: Divorced    Spouse name: Not on file   Number of children: Not on file   Years of education: 12   Highest education level: High school graduate  Occupational History   Occupation: Disabled  Tobacco Use   Smoking status: Never   Smokeless tobacco: Never  Vaping Use   Vaping status: Never Used  Substance and Sexual Activity   Alcohol use: Not Currently   Drug use: Never   Sexual activity: Yes  Other Topics Concern   Not on file  Social History Narrative   Not on file   Social Drivers of Health   Financial Resource Strain: Not on File (01/03/2022)   Received from [employer], [employer]    Financial Resource Strain: 0  Food Insecurity: Not on File (06/12/2023)   Received from [employer]    Food: 0  Transportation Needs: Not on File (01/03/2022)  Received from [facility], [employer] Needs    Transportation: 0  Physical Activity: Not on File (01/03/2022)   Received from [place], [place]   Physical Activity    Physical Activity: 0  Stress: Not on File (01/03/2022)   Received from [facility], [place]   Stress    Stress: 0  Social Connections: Not on File (06/12/2023)   Received from [facility]   Social Connections     Connectedness: 0  Intimate Partner Violence: Not on file   ROS: All systems reviewed and negative except as per HPI.   Current Outpatient Medications  Medication Sig Dispense Refill   acetaminophen  (TYLENOL ) 500 MG tablet Take 1,000 mg by mouth every 6 (six) hours as needed for moderate pain (pain score 4-6).     allopurinol  (ZYLOPRIM ) 100 MG tablet TAKE 2 TABLETS BY MOUTH ONCE DAILY (AM) 180 tablet 0   amiodarone  (PACERONE ) 200 MG tablet Take 200 mg twice a day     amLODipine  (NORVASC ) 10 MG tablet TAKE 1 TABLET (10 MG TOTAL) BY MOUTH DAILY WITH LUNCH (NOON) 30 tablet 0   aspirin  EC 81 MG tablet Take by mouth.     carvedilol  (COREG ) 25 MG tablet TAKE 1 & 1/2 TABLETS (37.5 MG TOTAL) BY MOUTH 2 (TWO) TIMES DAILY WITH A MEAL. 90 tablet 0   diphenhydrAMINE  (BENADRYL ) 25 MG tablet Take 50 mg by mouth daily as needed for allergies.     ELIQUIS  5 MG TABS tablet TAKE 1 TABLET (5 MG TOTAL) BY MOUTH 2 (TWO) TIMES DAILY (AM+PM) 60 tablet 0   furosemide  (LASIX ) 40 MG tablet TAKE 1 TABLET (40 MG TOTAL) BY MOUTH EVERY MORNING AND 1/2 TABLET (20 MG TOTAL) EVERY EVENING. 45 tablet 0   hydrALAZINE  (APRESOLINE ) 50 MG tablet TAKE 1 TABLET (50 MG TOTAL) BY MOUTH 3 (THREE) TIMES DAILY(AM+NOON+EVENING) 90 tablet 0   isosorbide  mononitrate (IMDUR ) 60 MG 24 hr tablet TAKE 1 & 1/2 TABLET (90 MG TOTAL) BY MOUTH DAILY (AM) 45 tablet 0   losartan  (COZAAR ) 100 MG tablet TAKE 1 TABLET BY MOUTH IN THE MORNING AND 1/2 TABLET EVERY EVENING(PM) 45 tablet 0   Multiple Vitamin (MULTIVITAMIN WITH MINERALS) TABS tablet Take 1 tablet by mouth daily. 30 tablet 0   rosuvastatin  (CRESTOR ) 10 MG tablet TAKE 1 TABLET (10 MG TOTAL) BY MOUTH DAILY. (PM) 30 tablet 0   spironolactone  (ALDACTONE ) 50 MG tablet TAKE 1 TABLET (50 MG TOTAL) BY MOUTH DAILY (AM) 30 tablet 0   No current facility-administered medications for this visit.   Wt Readings from Last 3 Encounters:  12/25/23 99.8 kg (220 lb)  12/08/23 106.2 kg (234 lb 3.2 oz)   09/09/23 102.1 kg (225 lb)   There were no vitals taken for this visit.  Physical Exam: General: Well appearing. No distress on RA Cardiac: JVP ~6-7cm. S1 and S2 present. No murmurs or rub. Abdomen: Soft, non-tender, non-distended. Extremities: Warm and dry. No peripheral edema.  Neuro: Alert and oriented x3. Affect pleasant. Moves all extremities without difficulty.  ECG: Atrial Flutter 122 bpm and 1 PVC (personally reviewed)  Assessment/Plan: 1. Chronic HFmrEF: NICM. HTN may play a role in CM. Strong suspicion of cardiac sarcoid (see below). LHC/RHC 2022 showed moderate CAD without interventional target. Echo 8/22: EF > 55%, moderate LVH, normal RV, no MR s/p MVR.  - Narrow pulse pressure in clinic today. Report BP at home 130/90-100. Warm, dry on exam. No clinical symptoms of low output.  - He is not volume overloaded  on exam, NYHA class I-II.  - Continue Lasix  40 qam/20 qpm.  BMET/BNP today - Continue losartan  100 qam/50 qpm (does not tolerate Entresto ).   - Continue Coreg  37.5 mg bid as above.  - Continue spironolactone  50 mg daily.  - Continue hydralazine  50 mg tid and Imdur  90 mg daily.  - Needs updated echo, in AFutter RVR today. Will schedule DC-CV with Dr. Mitzie Anda. Schedule echo at next follow up.    2. Cardiac sarcoidosis: Strong suspicion for cardiac sarcoidosis based on cPET and cMRI. Has not underwent myocardial biopsy. Bronchoscopy with biopsy in 7/22 was negative for sarcoidosis. Seen by Dr. America Just at the [facility] cardiac sarcoidosis clinic, it was thought that sarcoid is not particularly active (possible "burned out" sarcoidosis).   - Now has new insurance, needs a new PA for repeat cPET scan. Will need to guide treatment. If inflammation has increased, would plan immunosuppression. If inflammation is stable/decreased, no treatment.  3. Mitral regurgitation: Mod to severe since 5/20. TEE in 9/21 with moderate-severe MR, A2 prolapse. In 8/22, s/p MV and TV repairs with Maze at  [facility]. Post-op echo showed stable repaired MV in 8/22.  - Will need endocarditis prophylaxis with dental work.   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   5. CAD: Moderate CAD without interventional target on 2/22 cath, no chest pain.  - Continue Crestor , good lipids in 7/23 - He is on apixaban , so no ASA.   6. Atrial flutter/fibrillation: S/p Maze with MV/TV repairs. ECG showed Aflutter in 120-130s. Asymptomatic. Discussed need for ED evaluation should he develop s/s at home.  - Continue Eliquis  5 mg bid. Has not missed any doses. - Restart amiodarone  400 mg bid - Will schedule DC-CV with Dr. Mitzie Anda.  7. HTN: Negative renal artery dopplers and sleep study in past. BP stable today but generally controlled. No medication change today. - GDMT as above - Continue amlodipine   Follow up in *** with ***  [place] Dymir Neeson, NP 01/06/2024

## 2024-01-07 ENCOUNTER — Telehealth (HOSPITAL_COMMUNITY): Payer: Self-pay

## 2024-01-07 ENCOUNTER — Other Ambulatory Visit (HOSPITAL_COMMUNITY): Payer: Self-pay

## 2024-01-07 DIAGNOSIS — I5042 Chronic combined systolic (congestive) and diastolic (congestive) heart failure: Secondary | ICD-10-CM

## 2024-01-07 MED ORDER — ROSUVASTATIN CALCIUM 10 MG PO TABS: 10.0000 mg | ORAL_TABLET | Freq: Every day | ORAL | 5 refills | Status: DC

## 2024-01-07 MED ORDER — AMLODIPINE BESYLATE 10 MG PO TABS: 10.0000 mg | ORAL_TABLET | Freq: Every day | ORAL | 5 refills | Status: DC

## 2024-01-07 MED ORDER — LOSARTAN POTASSIUM 100 MG PO TABS: ORAL_TABLET | ORAL | 1 refills | Status: DC

## 2024-01-07 MED ORDER — SPIRONOLACTONE 50 MG PO TABS: 50.0000 mg | ORAL_TABLET | Freq: Every day | ORAL | 5 refills | Status: DC

## 2024-01-07 NOTE — Telephone Encounter (Signed)
 Called to confirm/remind patient of their appointment at the Advanced Heart Failure Clinic on 01/08/24.   Appointment:   [x] Confirmed  [] Left mess   [] No answer/No voice mail  [] VM Full/unable to leave message  [] Phone not in service  Patient reminded to bring all medications and/or complete list.  Confirmed patient has transportation. Gave directions, instructed to utilize valet parking.

## 2024-01-08 ENCOUNTER — Ambulatory Visit (HOSPITAL_COMMUNITY)
Admission: RE | Admit: 2024-01-08 | Discharge: 2024-01-08 | Disposition: A | Discharge status: Home / Self Care | Source: Ambulatory Visit | Attending: Cardiology | Admitting: Cardiology

## 2024-01-08 ENCOUNTER — Encounter (HOSPITAL_COMMUNITY): Payer: Self-pay

## 2024-01-08 VITALS — BP 132/88 | HR 59 | Ht 68.0 in | Wt 230.0 lb

## 2024-01-08 DIAGNOSIS — I1A0 Resistant hypertension: Secondary | ICD-10-CM | POA: Insufficient documentation

## 2024-01-08 DIAGNOSIS — I428 Other cardiomyopathies: Secondary | ICD-10-CM | POA: Insufficient documentation

## 2024-01-08 DIAGNOSIS — Z7901 Long term (current) use of anticoagulants: Secondary | ICD-10-CM | POA: Diagnosis not present

## 2024-01-08 DIAGNOSIS — I340 Nonrheumatic mitral (valve) insufficiency: Secondary | ICD-10-CM

## 2024-01-08 DIAGNOSIS — Z79899 Other long term (current) drug therapy: Secondary | ICD-10-CM | POA: Diagnosis not present

## 2024-01-08 DIAGNOSIS — I4892 Unspecified atrial flutter: Secondary | ICD-10-CM | POA: Diagnosis not present

## 2024-01-08 DIAGNOSIS — I5032 Chronic diastolic (congestive) heart failure: Secondary | ICD-10-CM | POA: Insufficient documentation

## 2024-01-08 DIAGNOSIS — I5042 Chronic combined systolic (congestive) and diastolic (congestive) heart failure: Secondary | ICD-10-CM

## 2024-01-08 DIAGNOSIS — I2510 Atherosclerotic heart disease of native coronary artery without angina pectoris: Secondary | ICD-10-CM | POA: Insufficient documentation

## 2024-01-08 DIAGNOSIS — I130 Hypertensive heart and chronic kidney disease with heart failure and stage 1 through stage 4 chronic kidney disease, or unspecified chronic kidney disease: Secondary | ICD-10-CM | POA: Diagnosis not present

## 2024-01-08 DIAGNOSIS — I4891 Unspecified atrial fibrillation: Secondary | ICD-10-CM | POA: Diagnosis not present

## 2024-01-08 DIAGNOSIS — I159 Secondary hypertension, unspecified: Secondary | ICD-10-CM

## 2024-01-08 DIAGNOSIS — K921 Melena: Secondary | ICD-10-CM

## 2024-01-08 DIAGNOSIS — D8685 Sarcoid myocarditis: Secondary | ICD-10-CM

## 2024-01-08 LAB — CBC
HCT: 42.6 % (ref 39.0–52.0)
Hemoglobin: 14.2 g/dL (ref 13.0–17.0)
MCH: 32 pg (ref 26.0–34.0)
MCHC: 33.3 g/dL (ref 30.0–36.0)
MCV: 95.9 fL (ref 80.0–100.0)
Platelets: 159 10*3/uL (ref 150–400)
RBC: 4.44 MIL/uL (ref 4.22–5.81)
RDW: 14.4 % (ref 11.5–15.5)
WBC: 3.8 10*3/uL — ABNORMAL LOW (ref 4.0–10.5)
nRBC: 0 % (ref 0.0–0.2)

## 2024-01-08 LAB — COMPREHENSIVE METABOLIC PANEL WITH GFR
ALT: 19 U/L (ref 0–44)
AST: 18 U/L (ref 15–41)
Albumin: 4 g/dL (ref 3.5–5.0)
Alkaline Phosphatase: 52 U/L (ref 38–126)
Anion gap: 8 (ref 5–15)
BUN: 19 mg/dL (ref 6–20)
CO2: 24 mmol/L (ref 22–32)
Calcium: 9.1 mg/dL (ref 8.9–10.3)
Chloride: 104 mmol/L (ref 98–111)
Creatinine, Ser: 1.53 mg/dL — ABNORMAL HIGH (ref 0.61–1.24)
GFR, Estimated: 52 mL/min — ABNORMAL LOW (ref >60)
Glucose, Bld: 81 mg/dL (ref 70–99)
Potassium: 4.6 mmol/L (ref 3.5–5.1)
Sodium: 136 mmol/L (ref 135–145)
Total Bilirubin: 1.2 mg/dL (ref 0.0–1.2)
Total Protein: 7.3 g/dL (ref 6.5–8.1)

## 2024-01-08 LAB — LIPID PANEL
Cholesterol: 142 mg/dL (ref 0–200)
HDL: 53 mg/dL (ref >40)
LDL Cholesterol: 80 mg/dL (ref 0–99)
Total CHOL/HDL Ratio: 2.7 ratio
Triglycerides: 46 mg/dL (ref <150)
VLDL: 9 mg/dL (ref 0–40)

## 2024-01-08 LAB — TSH: TSH: 2.29 u[IU]/mL (ref 0.350–4.500)

## 2024-01-08 MED ORDER — CARVEDILOL 25 MG PO TABS: 50.0000 mg | ORAL_TABLET | Freq: Two times a day (BID) | ORAL | 3 refills | Status: DC

## 2024-01-08 MED ORDER — SPIRONOLACTONE 50 MG PO TABS: 50.0000 mg | ORAL_TABLET | Freq: Every day | ORAL | 5 refills | Status: AC

## 2024-01-08 MED ORDER — APIXABAN 5 MG PO TABS: 5.0000 mg | ORAL_TABLET | Freq: Two times a day (BID) | ORAL | 5 refills | Status: DC

## 2024-01-08 MED ORDER — PANTOPRAZOLE SODIUM 40 MG PO TBEC: 40.0000 mg | DELAYED_RELEASE_TABLET | Freq: Every day | ORAL | 3 refills | Status: AC

## 2024-01-08 MED ORDER — HYDRALAZINE HCL 50 MG PO TABS: 50.0000 mg | ORAL_TABLET | Freq: Three times a day (TID) | ORAL | 5 refills | Status: DC

## 2024-01-08 MED ORDER — ROSUVASTATIN CALCIUM 10 MG PO TABS: 10.0000 mg | ORAL_TABLET | Freq: Every day | ORAL | 5 refills | Status: DC

## 2024-01-08 NOTE — Patient Instructions (Signed)
 Medication Changes:  START PROTONIX  (PANTOPRAZOLE ) 40MG  DAILY   INCREASE CARVEDILOL  TO 50MG  TWICE DAILY   Lab Work:  Labs done today, your results will be available in MyChart, we will contact you for abnormal readings.  Follow-Up in: 2 MONTHS AS SCHEDULED WITH AN ECHO  At the Advanced Heart Failure Clinic, you and your health needs are our priority. We have a designated team specialized in the treatment of Heart Failure. This Care Team includes your primary Heart Failure Specialized Cardiologist (physician), Advanced Practice Providers (APPs- Physician Assistants and Nurse Practitioners), and Pharmacist who all work together to provide you with the care you need, when you need it.   You may see any of the following providers on your designated Care Team at your next follow up:  Dr. Jules Oar Dr. Peder Bourdon Dr. Alwin Baars Dr. Judyth Nunnery Nieves Bars, NP Ruddy Corral, [place] [facility] [place], [place] Dennise Fitz, NP [place] Lee, NP Luster Salters, PharmD   Please be sure to bring in all your medications bottles to every appointment.   Need to Contact Us :  If you have any questions or concerns before your next appointment please send us  a message through [place] or call our office at [phone].    TO LEAVE A MESSAGE FOR THE NURSE SELECT OPTION 2, PLEASE LEAVE A MESSAGE INCLUDING: YOUR NAME DATE OF BIRTH CALL BACK NUMBER REASON FOR CALL**this is important as we prioritize the call backs  YOU WILL RECEIVE A CALL BACK THE SAME DAY AS LONG AS YOU CALL BEFORE 4:00 PM

## 2024-01-09 ENCOUNTER — Other Ambulatory Visit (HOSPITAL_COMMUNITY): Payer: Self-pay | Admitting: Cardiology

## 2024-01-09 MED ORDER — ISOSORBIDE MONONITRATE ER 60 MG PO TB24: 90.0000 mg | ORAL_TABLET | Freq: Every day | ORAL | 6 refills | Status: DC

## 2024-01-09 NOTE — Addendum Note (Signed)
 Encounter addended by: Chai Verdejo B, RN on: 01/09/2024 8:56 AM  Actions taken: Visit diagnoses modified, Order list changed, Diagnosis association updated

## 2024-02-18 ENCOUNTER — Other Ambulatory Visit (HOSPITAL_COMMUNITY): Payer: Self-pay

## 2024-02-18 DIAGNOSIS — D8685 Sarcoid myocarditis: Secondary | ICD-10-CM

## 2024-02-19 ENCOUNTER — Other Ambulatory Visit (HOSPITAL_COMMUNITY): Payer: Self-pay | Admitting: Cardiology

## 2024-02-20 ENCOUNTER — Encounter (HOSPITAL_COMMUNITY): Payer: Self-pay | Admitting: Emergency Medicine

## 2024-03-08 ENCOUNTER — Telehealth (HOSPITAL_COMMUNITY): Payer: Self-pay | Admitting: Emergency Medicine

## 2024-03-08 NOTE — Progress Notes (Signed)
 ADVANCED HF CLINIC NOTE  PCP: Jegede, Olugbemiga E, MD Cardiology: Dr. Shlomo HF Cardiology: Dr. Rolan  HPI: Daniel Joseph is a 63 y.o. male with history of cardiac sarcoidosis, CHF, resistant HTN, tricuspid valve fibroelastoma, and mitral regurgitation s/p MVR at [facility] in 2022.  TEE 12/15 showing EF 40-45%. Cardiolite  in 5/16 no ischemia but possible apical infarction. Echo in 5/20 EF still 40-45% with possible severe MR, severe pulmonary hypertension, and unchanged TV mass (suspected fibroelastoma).  PYP in 2020 not suggestive of TTR amyloid.  R/LHC 2020: nonobstructive CAD.   cMRI 1/22: LVEF 45%, gHK worse in the basal-mid inferior and inferoseptal walls, RVEF 54%, severe MR. Concerning for possible cardiac sarcoidosis.    LHC/RHC in 2/22 showed 50% mLAD stenosis, and 80% dLAD.  High res CT chest and cardiac PET 2022 concerning for possible cardiac (+ active inflammation) and pulmonary sarcoidosis (bronchoscopy negative). Echo in 5/22 showed EF 50-55%, moderate LV enlargement, low normal RV function, moderate RV enlargement, moderate-severe MR.  Seen in the sarcoid clinic at [facility] by Dr. Orlin. He was suspected to have burned out sarcoid with minimal activity. S/P MV repair/TV repair/Maze at [facility] in 8/22.   Echo post-op in 8/22: EF > 55%, moderate LVH, normal RV, s/p MV repair with mean gradient 3 and no MR, s/p TV repair with mean gradient 1 and mild TR, PASP 57 mmHg.   Has not been seen in AHF Clinic since 06/2022. He was overall doing well at the time. Weight was 230lb. He had not been started on suppressive therapy.  Follow up 12/08/23 with AF RVR, HR in 120-130s. Underwent s/p successful DCCV 12/25/23.  Today he returns for HF follow up. Overall feeling fine. No SOB with activity. Denies palpitations, abnormal bleeding, CP, dizziness, edema, or PND/Orthopnea. Appetite ok. Weight at home 223 pounds. Taking all medications. BP at home 150/90. Rare ETOH use, no tobacco or drug  use. Has cardiac PET arranged tomorrow.  Echo today 03/10/24, EF appears similar to previous at 50-55%, awaiting official MD interpretation.  ReDs reading: 34%, normal  ECG (personally reviewed): SB 49 bpm  Labs (6/20): Urine immunofixation negative, K 4.2, creatinine 1.36 Labs (4/25): K 4.6, creatinine 1.53, LDL 80, normal TSH, normal LFTs  PMH: 1. Gout 2. H/o NSVT/PVCs: Zio patch 1/22 with rare PVCs, few short NSVT runs.  3. 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  Seen on multiple echoes, first in 2015.  4. HTN: Renal artery dopplers in 6/16 were unremarkable. Sleep study about 2 years ago was negative per patient's report.  5. Mitral regurgitation: TEE in 2015 with mild to moderate MR.  - Echo (2/20): Moderate MR.  - Echo (5/20): Possible severe MR - TEE (6/20): Mild mitral valve prolapse with moderate MR.  - Echo (3/21): Moderate to severe MR.  - TEE (9/21): moderate-severe MR with prolapse of A2 (eccentric MR).  - Cardiac MRI (1/22) with severe MR (regurgitant fraction 54%, regurgitant volume 83 cc).  - Echo (5/22): moderate-severe MR with prolapse A2 - S/p MV repair at [facility] in 8/22.  Post-op echo in 8/22 with MV repair, mean gradient 3 mmHg, no MR.  6. 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  - Cardiolite  (5/16): Possible apical infarct, no ischemia.  - Echo (7/19): EF 50-55%.  - Echo (2/20): EF 50-55%, moderate MR, TV fibroelastoma.  - PYP scan (5/20): Grade 1 visually, H/CL 1.23.  Read as equivocal but likely negative for TTR  amyloidosis.  - Echo (5/20): EF 40-45%, mildly dilated LV with mild LVH, normal RV size and systolic function, possible severe MR, moderate TR, 1 cm nodule on TV may be fibroelastoma, PASP 79 mmHg.  - TEE (6/20): EF 50%, mild LV dilation, mild LVH, moderate LAE, tricuspid valve mass likely fibroelastoma, mild MVP with moderate MR.  - LHC/RHC (6/20): 50% LAD, 30% pRCA; mean  RA 4, PA 55/21 mean 34, mean PCWP 17, CI 2.66, PVR 2.8 WU.  - Cardiac MRI (8/20): EF 53%, mild LVH, inferoseptal/inferior mid-wall LGE (?cardiac sarcoidosis, does not look like amyloidosis), mild RV dilation with RVEF 54%, moderate eccentric MR, severe LAE. - Echo (3/21):  EF 55-60%, normal RV, severe LAE, moderate-severe MR. 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  - Cardiac MRI (1/22): moderate LV dilation with EF 45%, diffuse hypokinesis worse in the basal-mid inferior and inferoseptal walls, moderate RV dilation with EF 54%, severe MR with regurgitant fraction 54% and regurgitant volume 83 cc, mid-wall LGE in the basal inferoseptal and inferior walls concerning for possible cardiac sarcoidosis.  - RHC/LHC (2/22): 50% mid LAD, 80% dLAD; mean RA 6, PA 61/22 mean 39, mean PCWP 26 with v waves to 45, CI 2.69, PVR 2.13 WU.  - Cardiac PET: 4/22 at [facility] showed EF 42%, areas of active inflammation in LV myocardial concerning for cardiac sarcoidosis. - Bronchoscopy with biopsy in 7/22 did not show sarcoidosis.  - Echo (5/22): EF 50-55%, moderate LV enlargement, low normal RV function, moderate RV enlargement, moderate-severe MR with dilated IVC.  - Echo (8/22, post-MV/TV repair): EF > 55%, moderate LVH, normal RV, s/p MV repair with mean gradient 3 and no MR, s/p TV repair with mean gradient 1 and mild TR, PASP 57 mmHg. 7. Atrial flutter/fibrillation: Noted first in 9/21.  - S/p Maze in 8/22 with MV/TV surgery.  8. CAD: LHC (2/22) with 50% mid LAD, 80% distal LAD stenoses.  Medical management.  9. CT chest (3/22): Numerous mediastinal and axillary lymph nodes, some of which are enlarged. Findings can be seen with sarcoid or a lymphoproliferative disorder. 10. Tricuspid regurgitation: TV repair in 8/22 with MV repair, mean gradient 1 mmHg with mild TR.  11. CKD stage 3  FH:  Father with MI at  30, brother with MI at 63, mother with CHF, MI.  HTN in multiple family members.   Social History   Socioeconomic History   Marital status: Divorced    Spouse name: Not on file   Number of children: Not on file   Years of education: 12   Highest education level: High school graduate  Occupational History   Occupation: Disabled  Tobacco Use   Smoking status: Never   Smokeless tobacco: Never  Vaping Use   Vaping status: Never Used  Substance and Sexual Activity   Alcohol use: Not Currently   Drug use: Never   Sexual activity: Yes  Other Topics Concern   Not on file  Social History Narrative   Not on file   Social Drivers of Health   Financial Resource Strain: Not on File (01/03/2022)   Received from [employer]    Financial Resource Strain: 0  Food Insecurity: Not on File (06/12/2023)   Received from [employer] Insecurity    Food: 0  Transportation Needs: Not on File (01/03/2022)   Received from [employer]  Needs    Transportation: 0  Physical Activity: Not on File (01/03/2022)   Received from [facility]   Physical Activity    Physical Activity: 0  Stress: Not on File (01/03/2022)   Received from [facility]   Stress    Stress: 0  Social Connections: Not on File (06/12/2023)   Received from [facility]   Social Connections    Connectedness: 0  Intimate Partner Violence: Not on file   ROS: All systems reviewed and negative except as per HPI.   Current Outpatient Medications  Medication Sig Dispense Refill   acetaminophen  (TYLENOL ) 500 MG tablet Take 1,000 mg by mouth every 6 (six) hours as needed for moderate pain (pain score 4-6).     allopurinol  (ZYLOPRIM ) 100 MG tablet TAKE 2 TABLETS BY MOUTH ONCE DAILY (AM) 180 tablet 0   amiodarone  (PACERONE ) 200 MG tablet Take 200 mg twice a day     amLODipine  (NORVASC ) 10 MG tablet Take 1 tablet (10 mg total) by mouth daily. 30 tablet 5   apixaban  (ELIQUIS ) 5 MG TABS tablet Take 1 tablet (5 mg total) by  mouth 2 (two) times daily. 60 tablet 5   aspirin  EC 81 MG tablet Take by mouth.     carvedilol  (COREG ) 25 MG tablet Take 2 tablets (50 mg total) by mouth in the morning and at bedtime. 180 tablet 3   diphenhydrAMINE  (BENADRYL ) 25 MG tablet Take 50 mg by mouth daily as needed for allergies.     furosemide  (LASIX ) 40 MG tablet TAKE 1 TABLET (40 MG TOTAL) BY MOUTH EVERY MORNING AND 1/2 TABLET (20 MG TOTAL) EVERY EVENING. 45 tablet 0   hydrALAZINE  (APRESOLINE ) 50 MG tablet Take 1 tablet (50 mg total) by mouth 3 (three) times daily. 90 tablet 5   isosorbide  mononitrate (IMDUR ) 60 MG 24 hr tablet Take 1.5 tablets (90 mg total) by mouth daily. 45 tablet 6   losartan  (COZAAR ) 100 MG tablet TAKE 1 TABLET (100 MG TOTAL) BY MOUTH EVERY MORNING AND 1/2 TABLET (50 MG TOTAL) EVERY EVENING. 45 tablet 1   Multiple Vitamin (MULTIVITAMIN WITH MINERALS) TABS tablet Take 1 tablet by mouth daily. 30 tablet 0   pantoprazole  (PROTONIX ) 40 MG tablet Take 1 tablet (40 mg total) by mouth daily. 90 tablet 3   rosuvastatin  (CRESTOR ) 10 MG tablet Take 1 tablet (10 mg total) by mouth daily. 30 tablet 5   spironolactone  (ALDACTONE ) 50 MG tablet Take 1 tablet (50 mg total) by mouth daily. 30 tablet 5   No current facility-administered medications for this encounter.   Wt Readings from Last 3 Encounters:  03/10/24 101.9 kg (224 lb 9.6 oz)  01/08/24 104.3 kg (230 lb)  12/25/23 99.8 kg (220 lb)   BP (!) 156/98   Pulse (!) 49   Ht 5' 8 (1.727 m)   Wt 101.9 kg (224 lb 9.6 oz)   SpO2 99%   BMI 34.15 kg/m   Physical Exam: General:  NAD. No resp difficulty, walked into clinic HEENT: Normal Neck: Supple. No JVD. Cor: [place] rate & rhythm. No rubs, gallops or murmurs. Lungs: Clear Abdomen: Soft, nontender, nondistended.  Extremities: No cyanosis, clubbing, rash, edema Neuro: Alert & oriented x 3, moves all 4 extremities w/o difficulty. Affect pleasant.  Assessment/Plan: 1. Atrial flutter/fibrillation: S/p Maze with  MV/TV repairs. S/p successful DCCV to NS[facility] 4/25. SB on ECG today. - Decrease amiodarone  to 200 mg daily. Recent TSH and LFTs normal. He will need a regular eye exam while on amiodarone . -  Continue Eliquis  5 mg bid. CBC today. 2. Chronic HFpEF: NICM. HTN may play a role in CM. Strong suspicion of cardiac sarcoid (see below). [facility] 2022 showed moderate CAD without interventional target. Echo 5/22: EF > 55%, moderate LVH, normal RV, no MR s/p MVR. Echo today, EF appears similar to previous ~ 50-55%, awaiting official MD interpretation. NYHA I-II. Volume okay on exam, ReDs 34%. - Continue Lasix  40 qam/20 qpm.  BMET/BNP today - Continue losartan  100 qam/50 qpm (does not tolerate Entresto ).   - Continue Coreg  50 mg bid. - Continue spironolactone  50 mg daily.  - Increase hydralazine  to 75 mg tid + continue Imdur  90 mg daily.  3. Cardiac sarcoidosis: Strong suspicion for cardiac sarcoidosis based on cPET and cMRI. Has not underwent myocardial biopsy. Bronchoscopy with biopsy in 7/22 was negative for sarcoidosis. Seen by Dr. Orlin at the [facility] cardiac sarcoidosis clinic, it was thought that sarcoid is not particularly active (possible burned out sarcoidosis).   - Insurance has been a barrier to cPET scan. Will need this to guide treatment. If inflammation has increased, would plan immunosuppression. If inflammation is stable/decreased, no treatment. He has cardiac PET scheduled for tomorrow. Await findings. 4. Mitral regurgitation: Mod to severe since 5/20. TEE in 9/21 with moderate-severe MR, A2 prolapse. In 8/22, s/p MV and TV repairs with Maze at [facility]. Post-op echo showed stable repaired MV in 8/22. Echo today, results pending. - Will need endocarditis prophylaxis with dental work.  5. Tricuspid valve fibroelastoma + severe TR: 8/22 TV repair with removal of papillary fibroelastoma.  - Echo today, results pending. 6. CAD: Moderate CAD without interventional target on 2/22 cath, no chest pain.  - Most  recent LDL 80, increase Crestor  to 20 mg daily. LFTs and lipids in 8 weeks. - No ASA with Eliquis  use 7. HTN: Negative renal artery dopplers and sleep study in past. BP elevated today. - Increase hydralazine  to 75 mg tid as above. - Continue amlodipine  10 mg daily.  Follow up in 4 months with Dr. Rolan Harlene CHRISTELLA Glena, FNP 03/10/2024

## 2024-03-08 NOTE — Telephone Encounter (Signed)
Attempted to call patient regarding upcoming cardiac PET appointment. Left message on voicemail with name and callback number Aidan Moten RN Navigator Cardiac Imaging [facility] [phone] Office [phone] Cell  

## 2024-03-09 ENCOUNTER — Telehealth (HOSPITAL_COMMUNITY): Payer: Self-pay

## 2024-03-09 NOTE — Telephone Encounter (Signed)
 Called to confirm/remind patient of their appointment at the [facility] on 03/10/24.   Appointment:   [x] Confirmed  [] Left mess   [] No answer/No voice mail  [] VM Full/unable to leave message  [] Phone not in service  Patient reminded to bring all medications and/or complete list.  Confirmed patient has transportation. Gave directions, instructed to utilize valet parking.

## 2024-03-10 ENCOUNTER — Ambulatory Visit (HOSPITAL_COMMUNITY)
Admission: RE | Admit: 2024-03-10 | Discharge: 2024-03-10 | Disposition: A | Discharge status: Home / Self Care | Source: Ambulatory Visit | Attending: Family Medicine | Admitting: Family Medicine

## 2024-03-10 ENCOUNTER — Encounter (HOSPITAL_COMMUNITY): Payer: Self-pay

## 2024-03-10 ENCOUNTER — Ambulatory Visit (HOSPITAL_COMMUNITY): Payer: Self-pay | Admitting: Family Medicine

## 2024-03-10 ENCOUNTER — Ambulatory Visit (HOSPITAL_BASED_OUTPATIENT_CLINIC_OR_DEPARTMENT_OTHER)
Admission: RE | Admit: 2024-03-10 | Discharge: 2024-03-10 | Disposition: A | Discharge status: Home / Self Care | Source: Ambulatory Visit | Attending: *Deleted | Admitting: *Deleted

## 2024-03-10 VITALS — BP 156/98 | HR 49 | Ht 68.0 in | Wt 224.6 lb

## 2024-03-10 DIAGNOSIS — I5032 Chronic diastolic (congestive) heart failure: Secondary | ICD-10-CM | POA: Diagnosis not present

## 2024-03-10 DIAGNOSIS — I2510 Atherosclerotic heart disease of native coronary artery without angina pectoris: Secondary | ICD-10-CM | POA: Insufficient documentation

## 2024-03-10 DIAGNOSIS — D8685 Sarcoid myocarditis: Secondary | ICD-10-CM

## 2024-03-10 DIAGNOSIS — I4892 Unspecified atrial flutter: Secondary | ICD-10-CM | POA: Insufficient documentation

## 2024-03-10 DIAGNOSIS — Z79899 Other long term (current) drug therapy: Secondary | ICD-10-CM | POA: Insufficient documentation

## 2024-03-10 DIAGNOSIS — Z7901 Long term (current) use of anticoagulants: Secondary | ICD-10-CM | POA: Diagnosis not present

## 2024-03-10 DIAGNOSIS — I130 Hypertensive heart and chronic kidney disease with heart failure and stage 1 through stage 4 chronic kidney disease, or unspecified chronic kidney disease: Secondary | ICD-10-CM | POA: Insufficient documentation

## 2024-03-10 DIAGNOSIS — I358 Other nonrheumatic aortic valve disorders: Secondary | ICD-10-CM | POA: Diagnosis not present

## 2024-03-10 DIAGNOSIS — Z9889 Other specified postprocedural states: Secondary | ICD-10-CM

## 2024-03-10 DIAGNOSIS — I340 Nonrheumatic mitral (valve) insufficiency: Secondary | ICD-10-CM

## 2024-03-10 DIAGNOSIS — I5042 Chronic combined systolic (congestive) and diastolic (congestive) heart failure: Secondary | ICD-10-CM

## 2024-03-10 DIAGNOSIS — I428 Other cardiomyopathies: Secondary | ICD-10-CM | POA: Diagnosis not present

## 2024-03-10 DIAGNOSIS — I10 Essential (primary) hypertension: Secondary | ICD-10-CM

## 2024-03-10 LAB — BASIC METABOLIC PANEL WITH GFR
Anion gap: 10 (ref 5–15)
BUN: 15 mg/dL (ref 6–20)
CO2: 26 mmol/L (ref 22–32)
Calcium: 8.9 mg/dL (ref 8.9–10.3)
Chloride: 105 mmol/L (ref 98–111)
Creatinine, Ser: 1.55 mg/dL — ABNORMAL HIGH (ref 0.61–1.24)
GFR, Estimated: 51 mL/min — ABNORMAL LOW (ref >60)
Glucose, Bld: 82 mg/dL (ref 70–99)
Potassium: 3.9 mmol/L (ref 3.5–5.1)
Sodium: 141 mmol/L (ref 135–145)

## 2024-03-10 LAB — ECHOCARDIOGRAM COMPLETE
Area-P 1/2: 4.36 cm2
Calc EF: 56.6 %
MV VTI: 1.45 cm2
S' Lateral: 4.8 cm
Single Plane A2C EF: 60.9 %
Single Plane A4C EF: 53.1 %

## 2024-03-10 LAB — CBC
HCT: 40.2 % (ref 39.0–52.0)
Hemoglobin: 13.4 g/dL (ref 13.0–17.0)
MCH: 32.1 pg (ref 26.0–34.0)
MCHC: 33.3 g/dL (ref 30.0–36.0)
MCV: 96.2 fL (ref 80.0–100.0)
Platelets: 128 10*3/uL — ABNORMAL LOW (ref 150–400)
RBC: 4.18 MIL/uL — ABNORMAL LOW (ref 4.22–5.81)
RDW: 13.5 % (ref 11.5–15.5)
WBC: 3.3 10*3/uL — ABNORMAL LOW (ref 4.0–10.5)
nRBC: 0 % (ref 0.0–0.2)

## 2024-03-10 LAB — BRAIN NATRIURETIC PEPTIDE: B Natriuretic Peptide: 681 pg/mL — ABNORMAL HIGH (ref 0.0–100.0)

## 2024-03-10 MED ORDER — ROSUVASTATIN CALCIUM 20 MG PO TABS: 20.0000 mg | ORAL_TABLET | Freq: Every day | ORAL | 3 refills | Status: AC

## 2024-03-10 MED ORDER — HYDRALAZINE HCL 50 MG PO TABS: 75.0000 mg | ORAL_TABLET | Freq: Three times a day (TID) | ORAL | 5 refills | Status: DC

## 2024-03-10 MED ORDER — AMIODARONE HCL 200 MG PO TABS: 200.0000 mg | ORAL_TABLET | Freq: Every day | ORAL | 5 refills | Status: DC

## 2024-03-10 MED ORDER — FUROSEMIDE 40 MG PO TABS: ORAL_TABLET | ORAL | 3 refills | Status: DC

## 2024-03-10 NOTE — Patient Instructions (Signed)
 Medication Changes:  DECREASE AMIODARONE  TO 200MG  ONCE DAILY   INCREASE HYDRALAZINE  TO 75MG  THREE TIMES DAILY   INCREASE ROSUVASTATIN  TO 20MG  ONCE DAILY   Lab Work:  Labs done today, your results will be available in MyChart, we will contact you for abnormal readings.  THEN LABS AGAIN IN 2 MONTHS AS SCHEDULED   Follow-Up in: 4 MONTHS WITH DR. ROLAN PLEASE CALL OUR OFFICE AROUND AUGUST  TO GET SCHEDULED FOR YOUR APPOINTMENT. PHONE NUMBER IS [phone] OPTION 2   At the [facility], you and your health needs are our priority. We have a designated team specialized in the treatment of Heart Failure. This Care Team includes your primary Heart Failure Specialized Cardiologist (physician), Advanced Practice Providers (APPs- Physician Assistants and Nurse Practitioners), and Pharmacist who all work together to provide you with the care you need, when you need it.   You may see any of the following providers on your designated Care Team at your next follow up:  Dr. Toribio Fuel Dr. Ezra ROLAN Dr. Ria Commander Dr. Odis Brownie Greig Mosses, NP Caffie Shed, [place] [facility] [place], [place] Beckey Coe, NP [place] Lee, NP Tinnie Redman, PharmD   Please be sure to bring in all your medications bottles to every appointment.   Need to Contact Us :  If you have any questions or concerns before your next appointment please send us  a message through [place] or call our office at [phone].    TO LEAVE A MESSAGE FOR THE NURSE SELECT OPTION 2, PLEASE LEAVE A MESSAGE INCLUDING: YOUR NAME DATE OF BIRTH CALL BACK NUMBER REASON FOR CALL**this is important as we prioritize the call backs  YOU WILL RECEIVE A CALL BACK THE SAME DAY AS LONG AS YOU CALL BEFORE 4:00 PM

## 2024-03-10 NOTE — Progress Notes (Signed)
 ReDS Vest / Clip - 03/10/24 1200       ReDS Vest / Clip   Station Marker D    Ruler Value 33    ReDS Value Range Low volume    ReDS Actual Value 34

## 2024-03-11 ENCOUNTER — Encounter (HOSPITAL_COMMUNITY): Admission: RE | Admit: 2024-03-11 | Source: Ambulatory Visit

## 2024-03-15 NOTE — Telephone Encounter (Signed)
 Called patient per Harlene Gainer, NP with following lab results:  BNP creeping up, watching to swelling or worsening dyspnea.  Pt admits to eating wrong food prior to those labs and has since adhered to his diet. Pt reports he missed Daniel Joseph appointment last week for CT scan. Provided him with contact number to call and re-schedule his appointment.

## 2024-03-22 ENCOUNTER — Ambulatory Visit (HOSPITAL_COMMUNITY)
Admission: EM | Admit: 2024-03-22 | Discharge: 2024-03-22 | Disposition: A | Discharge status: Home / Self Care | Attending: Family Medicine | Admitting: Family Medicine

## 2024-03-22 ENCOUNTER — Encounter (HOSPITAL_COMMUNITY): Payer: Self-pay

## 2024-03-22 DIAGNOSIS — M109 Gout, unspecified: Secondary | ICD-10-CM

## 2024-03-22 DIAGNOSIS — M25522 Pain in left elbow: Secondary | ICD-10-CM | POA: Diagnosis not present

## 2024-03-22 MED ORDER — COLCHICINE 0.6 MG PO TABS: 1.2000 mg | ORAL_TABLET | Freq: Once | ORAL | Status: DC

## 2024-03-22 MED ORDER — PREDNISONE 20 MG PO TABS
40.0000 mg | ORAL_TABLET | Freq: Once | ORAL | Status: AC
  Administered 2024-03-22: 40 mg via ORAL

## 2024-03-22 MED ORDER — PREDNISONE 20 MG PO TABS: 40.0000 mg | ORAL_TABLET | Freq: Every day | ORAL | 0 refills | Status: AC

## 2024-03-22 MED ORDER — PREDNISONE 20 MG PO TABS
ORAL_TABLET | ORAL | Status: AC
Start: 2024-03-22 — End: 2024-03-22
  Filled 2024-03-22: qty 2

## 2024-03-22 NOTE — Discharge Instructions (Addendum)
 Please take 40mg  prednisone  daily for 4 days (starting tomorrow 7/8) for your gout flare  Continue your allopurinol  as prescribed  Please avoid triggers such as alcohol, red meat, or fish  Please seek medical attention if your pain or swelling does not improve or worsens.  Especially if it becomes red, warm, or you start to have symptoms in other joints, or if you lose feeling in your arm/hand/fingers.  Please seek medical attention if you develop any chest pain, shortness of breath.

## 2024-03-22 NOTE — ED Provider Notes (Signed)
 [facility]    CSN: [id number] Arrival date & time: 03/22/24  0805      History   Chief Complaint Chief Complaint  Patient presents with   Gout   Elbow Pain    HPI Daniel Joseph is a 63 y.o. male.   Left elbow pain and swelling x 2 days Concern for gout flare, this feels similar to previous flares On allopurinol , taking regularly Does not have any colchicine  or steroids at home. Reports previously responding well to prednisone  burst Otherwise no home interventions  Reports this attack likely precipitated by 4th of July eating/ drinking -- had some red meat, fish, and beer on Friday and Saturday prior to symptom onset  Denies fevers, chest pain, shortness of breath, involvement of other joints     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, PASP 79   Gout    H/O atrial flutter 05/2020   but back to NSR   Mitral valve regurgitation    mild to moderate MR with MVP of ANMVL by echo 2019   PVC's (premature ventricular contractions)    nonsustained VT as well as PVCs - no ICD indicated due to EF 40-45%   Sleep apnea    pending second sleep study    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    Patient Active Problem List   Diagnosis Date Noted   Adenopathy 03/14/2021   Neuropathy 02/02/2020   Nausea 02/02/2020   Acute exacerbation of CHF (congestive heart failure) (HCC) 08/29/2019   Alcohol abuse 08/29/2019   Hypoxic Respiratory failure, acute (HCC) 08/29/2019   PVC's  (premature ventricular contractions)    HTN (hypertension) 03/02/2014   Chronic combined systolic and diastolic CHF (congestive heart failure) (HCC) 03/02/2014   Hypertensive cardiomyopathy (HCC) 03/02/2014   CKD (chronic kidney disease), stage III (HCC) 02/15/2014   Neck nodule 02/01/2014   Eye redness 02/01/2014   Mitral valve regurgitation 01/21/2014   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    Past Surgical History:  Procedure Laterality Date   ABDOMINAL SURGERY  at birth   BRONCHIAL NEEDLE ASPIRATION BIOPSY  03/29/2021   Procedure: BRONCHIAL NEEDLE ASPIRATION BIOPSIES;  Surgeon: Brenna Adine CROME, DO;  Location: [facility];  Service: Pulmonary;;   BUBBLE STUDY  03/16/2019   Procedure: BUBBLE STUDY;  Surgeon: Rolan Ezra RAMAN, MD;  Location: [facility] ENDOSCOPY;  Service: Cardiovascular;;   CARDIAC CATHETERIZATION     CARDIAC VALVE REPLACEMENT     CARDIOVERSION N/A 06/08/2020   Procedure: CARDIOVERSION;  Surgeon: Rolan Ezra RAMAN, MD;  Location: [facility] ENDOSCOPY;  Service: Cardiovascular;  Laterality: N/A;   CARDIOVERSION N/A 12/25/2023   Procedure: CARDIOVERSION;  Surgeon: Rolan Ezra RAMAN, MD;  Location: [facility] INVASIVE CV LAB;  Service: Cardiovascular;  Laterality: N/A;   RIGHT/LEFT HEART CATH AND CORONARY ANGIOGRAPHY N/A 03/16/2019   Procedure: RIGHT/LEFT HEART CATH AND CORONARY ANGIOGRAPHY;  Surgeon: Rolan Ezra RAMAN, MD;  Location: [facility] INVASIVE CV LAB;  Service: Cardiovascular;  Laterality:  N/A;   RIGHT/LEFT HEART CATH AND CORONARY ANGIOGRAPHY N/A 11/09/2020   Procedure: RIGHT/LEFT HEART CATH AND CORONARY ANGIOGRAPHY;  Surgeon: Rolan Ezra RAMAN, MD;  Location: [facility] INVASIVE CV LAB;  Service: Cardiovascular;  Laterality: N/A;   TEE WITHOUT CARDIOVERSION N/A 01/25/2014   Procedure: TRANSESOPHAGEAL ECHOCARDIOGRAM (TEE);  Surgeon: Aleene JINNY Passe, MD;  Location: [facility] ENDOSCOPY;  Service: Cardiovascular;  Laterality: N/A;   TEE WITHOUT CARDIOVERSION N/A 09/14/2014   Procedure: TRANSESOPHAGEAL ECHOCARDIOGRAM  (TEE);  Surgeon: Wilbert JONELLE Bihari, MD;  Location: [facility] ENDOSCOPY;  Service: Cardiovascular;  Laterality: N/A;   TEE WITHOUT CARDIOVERSION N/A 03/16/2019   Procedure: TRANSESOPHAGEAL ECHOCARDIOGRAM (TEE);  Surgeon: Rolan Ezra RAMAN, MD;  Location: [facility] ENDOSCOPY;  Service: Cardiovascular;  Laterality: N/A;   TEE WITHOUT CARDIOVERSION N/A 06/08/2020   Procedure: TRANSESOPHAGEAL ECHOCARDIOGRAM (TEE);  Surgeon: Rolan Ezra RAMAN, MD;  Location: [facility] ENDOSCOPY;  Service: Cardiovascular;  Laterality: N/A;   VIDEO BRONCHOSCOPY WITH ENDOBRONCHIAL ULTRASOUND N/A 03/29/2021   Procedure: VIDEO BRONCHOSCOPY WITH ENDOBRONCHIAL ULTRASOUND;  Surgeon: Brenna Adine CROME, DO;  Location: [facility] ENDOSCOPY;  Service: Pulmonary;  Laterality: N/A;       Home Medications    Prior to Admission medications   Medication Sig Start Date End Date Taking? Authorizing Provider  allopurinol  (ZYLOPRIM ) 100 MG tablet TAKE 2 TABLETS BY MOUTH ONCE DAILY (AM) 11/21/23  Yes Rolan Ezra RAMAN, MD  amiodarone  (PACERONE ) 200 MG tablet Take 1 tablet (200 mg total) by mouth daily. 03/10/24  Yes Milford, Harlene HERO, FNP  amLODipine  (NORVASC ) 10 MG tablet Take 1 tablet (10 mg total) by mouth daily. 01/07/24  Yes Rolan Ezra RAMAN, MD  apixaban  (ELIQUIS ) 5 MG TABS tablet Take 1 tablet (5 mg total) by mouth 2 (two) times daily. 01/08/24  Yes Lee, [place], NP  aspirin  EC 81 MG tablet Take by mouth. 04/01/14  Yes [provider]  carvedilol  (COREG ) 25 MG tablet Take 2 tablets (50 mg total) by mouth in the morning and at bedtime. 01/08/24  Yes Lee, [place], NP  diphenhydrAMINE  (BENADRYL ) 25 MG tablet Take 50 mg by mouth daily as needed for allergies.   Yes [provider]  furosemide  (LASIX ) 40 MG tablet TAKE 1 TABLET (40 MG TOTAL) BY MOUTH EVERY MORNING AND 1/2 TABLET (20 MG TOTAL) EVERY EVENING. 03/10/24  Yes Milford, Harlene HERO, FNP  hydrALAZINE  (APRESOLINE ) 50 MG tablet Take 1.5 tablets (75 mg total) by mouth 3 (three) times daily. 03/10/24  Yes  Milford, Harlene HERO, FNP  isosorbide  mononitrate (IMDUR ) 60 MG 24 hr tablet Take 1.5 tablets (90 mg total) by mouth daily. 01/09/24  Yes Rolan Ezra RAMAN, MD  losartan  (COZAAR ) 100 MG tablet TAKE 1 TABLET (100 MG TOTAL) BY MOUTH EVERY MORNING AND 1/2 TABLET (50 MG TOTAL) EVERY EVENING. 02/19/24  Yes Rolan Ezra RAMAN, MD  Multiple Vitamin (MULTIVITAMIN WITH MINERALS) TABS tablet Take 1 tablet by mouth daily. 09/02/19  Yes Sheth, Devam P, MD  pantoprazole  (PROTONIX ) 40 MG tablet Take 1 tablet (40 mg total) by mouth daily. 01/08/24  Yes Lee, [place], NP  rosuvastatin  (CRESTOR ) 20 MG tablet Take 1 tablet (20 mg total) by mouth daily. 03/10/24  Yes Milford, Harlene HERO, FNP  spironolactone  (ALDACTONE ) 50 MG tablet Take 1 tablet (50 mg total) by mouth daily. 01/08/24  Yes Lee, [place], NP  acetaminophen  (TYLENOL ) 500 MG tablet Take 1,000 mg by mouth every 6 (six) hours as needed for moderate pain (pain score 4-6).    [provider]    Family History Family  History  Problem Relation Age of Onset   Heart Problems Mother    Stroke Mother    Heart failure Mother    Hypertension Mother    Heart Problems Father    Heart attack Father    Hypertension Father    Heart Problems Brother    Heart attack Brother    Hypertension Brother    Colon cancer Neg Hx    Esophageal cancer Neg Hx    Stomach cancer Neg Hx    Colon polyps Neg Hx    Rectal cancer Neg Hx     Social History Social History   Tobacco Use   Smoking status: Never   Smokeless tobacco: Never  Vaping Use   Vaping status: Never Used  Substance Use Topics   Alcohol use: Not Currently   Drug use: Never     Allergies   Entresto  [sacubitril-valsartan] and Benazepril    Review of Systems Review of Systems Per HPI   Physical Exam Triage Vital Signs ED Triage Vitals [03/22/24 0819]  Encounter Vitals Group     BP (!) 168/94     Girls Systolic BP Percentile      Girls Diastolic BP Percentile      Boys Systolic BP Percentile       Boys Diastolic BP Percentile      Pulse Rate 60     Resp 20     Temp (!) 97.4 F (36.3 C)     Temp Source Oral     SpO2 99 %     Weight      Height      Head Circumference      Peak Flow      Pain Score 8     Pain Loc      Pain Education      Exclude from Growth Chart    No data found.  Updated Vital Signs BP (!) 168/94 (BP Location: Left Arm)   Pulse 60   Temp (!) 97.4 F (36.3 C) (Oral)   Resp 20   SpO2 99%   Visual Acuity Right Eye Distance:   Left Eye Distance:   Bilateral Distance:    Right Eye Near:   Left Eye Near:    Bilateral Near:     Physical Exam Constitutional:      General: He is not in acute distress.    Appearance: He is normal weight. He is not toxic-appearing.  HENT:     Head: Normocephalic and atraumatic.  Eyes:     Extraocular Movements: Extraocular movements intact.     Pupils: Pupils are equal, round, and reactive to light.  Cardiovascular:     Rate and Rhythm: Normal rate and regular rhythm.     Heart sounds: Normal heart sounds. No murmur heard. Pulmonary:     Effort: Pulmonary effort is normal. No respiratory distress.     Breath sounds: Normal breath sounds.  Abdominal:     General: Abdomen is flat.     Palpations: Abdomen is soft.  Musculoskeletal:        General: Swelling present.     Cervical back: Normal range of motion and neck supple.     Comments: Edematous, tender left elbow.  Swelling to proximal/mid forearm.  No overlying erythema or warmth.  ROM restricted due to pain.  Radial pulses 2+.  Right upper extremity unremarkable with F ROM.  Skin:    General: Skin is warm and dry.     Capillary Refill: Capillary refill takes  less than 2 seconds.     Findings: No erythema.  Neurological:     General: No focal deficit present.     Mental Status: He is alert. Mental status is at baseline.      UC Treatments / Results  Labs (all labs ordered are listed, but only abnormal results are displayed) Labs Reviewed - No data  to display  EKG   Radiology No results found.  Procedures Procedures (including critical care time)  Medications Ordered in UC Medications  predniSONE  (DELTASONE ) tablet 40 mg (40 mg Oral Given 03/22/24 0858)    Initial Impression / Assessment and Plan / UC Course  I have reviewed the triage vital signs and the nursing notes.  Pertinent labs & imaging results that were available during my care of the patient were reviewed by me and considered in my medical decision making (see chart for details).     Left elbow pain x 2 days concerning for gout flare given history of recent triggers including alcohol, red meat, fish intake on Fourth of July weekend.  Patient has previously responded well to prednisone  burst.  No involvement of other joints.  No fevers or other systemic symptoms or warmth/erythema/wounds on exam to suggest septic arthritis.  Rx prednisone  40 mg daily x 5 days, first dose here  Pt on amiodarone  and coreg  which may interfere with colchicine  - avoid for now  Discussed supportive care and return precautions. On Eliquis  and hx CKD, avoid NSAIDs.   Final Clinical Impressions(s) / UC Diagnoses   Final diagnoses:  Left elbow pain  Acute gout of left elbow, unspecified cause     Discharge Instructions      Please take 40mg  prednisone  daily for 4 days (starting tomorrow 7/8) for your gout flare  Continue your allopurinol  as prescribed  Please avoid triggers such as alcohol, red meat, or fish  Please seek medical attention if your pain or swelling does not improve or worsens.  Especially if it becomes red, warm, or you start to have symptoms in other joints, or if you lose feeling in your arm/hand/fingers.  Please seek medical attention if you develop any chest pain, shortness of breath.     ED Prescriptions   None    PDMP not reviewed this encounter.   Romelle Booty, MD 03/22/24 [phone]

## 2024-03-22 NOTE — ED Triage Notes (Signed)
 Patient states he has gout flare-up in his left elbow x 2 days. Home Intervention: None.

## 2024-03-23 ENCOUNTER — Telehealth (HOSPITAL_COMMUNITY): Payer: Self-pay | Admitting: *Deleted

## 2024-03-23 NOTE — Telephone Encounter (Signed)
 Attempted to call patient regarding upcoming cardiac PET appointment. Left message on voicemail with name and callback number  Larey Brick RN Navigator Cardiac Imaging [facility] [phone] Office [phone] Cell

## 2024-03-25 ENCOUNTER — Encounter (HOSPITAL_COMMUNITY): Admission: RE | Admit: 2024-03-25 | Source: Ambulatory Visit

## 2024-04-20 ENCOUNTER — Ambulatory Visit (INDEPENDENT_AMBULATORY_CARE_PROVIDER_SITE_OTHER)

## 2024-04-20 ENCOUNTER — Ambulatory Visit (HOSPITAL_COMMUNITY)
Admission: EM | Admit: 2024-04-20 | Discharge: 2024-04-20 | Disposition: A | Discharge status: Home / Self Care | Attending: Physician Assistant | Admitting: Physician Assistant

## 2024-04-20 ENCOUNTER — Encounter (HOSPITAL_COMMUNITY): Payer: Self-pay

## 2024-04-20 DIAGNOSIS — S9001XA Contusion of right ankle, initial encounter: Secondary | ICD-10-CM

## 2024-04-20 DIAGNOSIS — M25571 Pain in right ankle and joints of right foot: Secondary | ICD-10-CM | POA: Diagnosis not present

## 2024-04-20 MED ORDER — LIDOCAINE 5 % EX OINT: 1.0000 | TOPICAL_OINTMENT | Freq: Three times a day (TID) | CUTANEOUS | 0 refills | Status: DC | PRN

## 2024-04-20 NOTE — ED Triage Notes (Signed)
 Patient here to today with c/o right ankle pain X 5 days. Patient states that he was loading a trailer and a piece of wood fell down and hit the lateral side of his right ankle. Patient has been having increased pain with weightbearing. Patient has some swelling. Patient has been taking Tylenol  with no relief.

## 2024-04-20 NOTE — ED Provider Notes (Signed)
 [facility]    CSN: [id number] Arrival date & time: 04/20/24  9177      History   Chief Complaint Chief Complaint  Patient presents with   Ankle Pain    HPI Daniel Joseph is a 63 y.o. male.   Patient presents today for evaluation after injury that occurred approximately 5 or 6 days ago.  He reports that he was unloading a trailer when a piece of wood fell and hit the outside of his right ankle.  He initially had pain but this is gradually been worsening and over the past few days has become unbearable to the point he is having difficulty ambulating without assistance.  Currently pain is rated 9/10 on a 0-10 pain scale, localized to lateral right ankle, described as throbbing, no aggravating relieving factors identified.  He denies any numbness or paresthesias in the foot.  Denies any previous injury or surgery involving his ankle.  He has been taking Tylenol  as well as applying ice with minimal improvement of symptoms.    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, PASP 79   Gout    H/O atrial flutter 05/2020   but back to NSR   Mitral valve regurgitation    mild to moderate MR with MVP of ANMVL by echo 2019   PVC's (premature ventricular contractions)    nonsustained VT as well as PVCs - no ICD indicated due to EF 40-45%   Sleep apnea    pending second sleep study    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    Patient Active Problem List   Diagnosis Date Noted   Adenopathy 03/14/2021    Neuropathy 02/02/2020   Nausea 02/02/2020   Acute exacerbation of CHF (congestive heart failure) (HCC) 08/29/2019   Alcohol abuse 08/29/2019   Hypoxic Respiratory failure, acute (HCC) 08/29/2019   PVC's (premature ventricular contractions)    HTN (hypertension) 03/02/2014   Chronic combined systolic and diastolic CHF (congestive heart failure) (HCC) 03/02/2014   Hypertensive cardiomyopathy (HCC) 03/02/2014   CKD (chronic kidney disease), stage III (HCC) 02/15/2014   Neck nodule 02/01/2014   Eye redness 02/01/2014   Mitral valve regurgitation 01/21/2014   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    Past Surgical History:  Procedure Laterality Date   ABDOMINAL SURGERY  at birth   BRONCHIAL NEEDLE ASPIRATION BIOPSY  03/29/2021   Procedure: BRONCHIAL NEEDLE ASPIRATION BIOPSIES;  Surgeon: Brenna Adine CROME, DO;  Location: [facility];  Service: Pulmonary;;   BUBBLE STUDY  03/16/2019   Procedure: BUBBLE STUDY;  Surgeon: Rolan Ezra RAMAN, MD;  Location: [facility] ENDOSCOPY;  Service: Cardiovascular;;   CARDIAC CATHETERIZATION     CARDIAC VALVE REPLACEMENT     CARDIOVERSION N/A 06/08/2020   Procedure: CARDIOVERSION;  Surgeon: Rolan Ezra RAMAN, MD;  Location: [facility] ENDOSCOPY;  Service: Cardiovascular;  Laterality: N/A;   CARDIOVERSION N/A 12/25/2023   Procedure: CARDIOVERSION;  Surgeon: Rolan Ezra RAMAN, MD;  Location: [facility] INVASIVE CV LAB;  Service: Cardiovascular;  Laterality: N/A;   RIGHT/LEFT HEART CATH AND CORONARY ANGIOGRAPHY N/A 03/16/2019   Procedure: RIGHT/LEFT HEART CATH AND CORONARY ANGIOGRAPHY;  Surgeon: Rolan Ezra RAMAN, MD;  Location: [facility] INVASIVE CV LAB;  Service: Cardiovascular;  Laterality: N/A;   RIGHT/LEFT HEART CATH AND CORONARY ANGIOGRAPHY N/A 11/09/2020   Procedure: RIGHT/LEFT HEART CATH AND CORONARY ANGIOGRAPHY;  Surgeon: Rolan Ezra RAMAN, MD;  Location: [facility] INVASIVE CV LAB;  Service: Cardiovascular;  Laterality: N/A;   TEE WITHOUT CARDIOVERSION N/A 01/25/2014   Procedure: TRANSESOPHAGEAL  ECHOCARDIOGRAM (TEE);  Surgeon: Aleene JINNY Passe, MD;  Location: [facility] ENDOSCOPY;  Service: Cardiovascular;  Laterality: N/A;   TEE WITHOUT CARDIOVERSION N/A 09/14/2014   Procedure: TRANSESOPHAGEAL ECHOCARDIOGRAM (TEE);  Surgeon: Wilbert JONELLE Bihari, MD;  Location: [facility] ENDOSCOPY;  Service: Cardiovascular;  Laterality: N/A;   TEE WITHOUT CARDIOVERSION N/A 03/16/2019   Procedure: TRANSESOPHAGEAL ECHOCARDIOGRAM (TEE);  Surgeon: Rolan Ezra RAMAN, MD;  Location: [facility] ENDOSCOPY;  Service: Cardiovascular;  Laterality: N/A;   TEE WITHOUT CARDIOVERSION N/A 06/08/2020   Procedure: TRANSESOPHAGEAL ECHOCARDIOGRAM (TEE);  Surgeon: Rolan Ezra RAMAN, MD;  Location: [facility] ENDOSCOPY;  Service: Cardiovascular;  Laterality: N/A;   VIDEO BRONCHOSCOPY WITH ENDOBRONCHIAL ULTRASOUND N/A 03/29/2021   Procedure: VIDEO BRONCHOSCOPY WITH ENDOBRONCHIAL ULTRASOUND;  Surgeon: Brenna Adine CROME, DO;  Location: [facility] ENDOSCOPY;  Service: Pulmonary;  Laterality: N/A;       Home Medications    Prior to Admission medications   Medication Sig Start Date End Date Taking? Authorizing Provider  lidocaine  (XYLOCAINE ) 5 % ointment Apply 1 Application topically 3 (three) times daily as needed. 04/20/24  Yes Jeromey Kruer K, PA-C  acetaminophen  (TYLENOL ) 500 MG tablet Take 1,000 mg by mouth every 6 (six) hours as needed for moderate pain (pain score 4-6).    [provider]  allopurinol  (ZYLOPRIM ) 100 MG tablet TAKE 2 TABLETS BY MOUTH ONCE DAILY (AM) 11/21/23   Rolan Ezra RAMAN, MD  amiodarone  (PACERONE ) 200 MG tablet Take 1 tablet (200 mg total) by mouth daily. 03/10/24   Milford, Harlene HERO, FNP  amLODipine  (NORVASC ) 10 MG tablet Take 1 tablet (10 mg total) by mouth daily. 01/07/24   Rolan Ezra RAMAN, MD  apixaban  (ELIQUIS ) 5 MG TABS tablet Take 1 tablet (5 mg total) by mouth 2 (two) times daily. 01/08/24   Lee, [place], NP  aspirin  EC 81 MG tablet Take by mouth. 04/01/14   [provider]  carvedilol  (COREG ) 25 MG tablet Take 2 tablets (50 mg  total) by mouth in the morning and at bedtime. 01/08/24   Lee, [place], NP  diphenhydrAMINE  (BENADRYL ) 25 MG tablet Take 50 mg by mouth daily as needed for allergies.    [provider]  furosemide  (LASIX ) 40 MG tablet TAKE 1 TABLET (40 MG TOTAL) BY MOUTH EVERY MORNING AND 1/2 TABLET (20 MG TOTAL) EVERY EVENING. 03/10/24   Milford, Harlene HERO, FNP  hydrALAZINE  (APRESOLINE ) 50 MG tablet Take 1.5 tablets (75 mg total) by mouth 3 (three) times daily. 03/10/24   Glena Harlene HERO, FNP  isosorbide  mononitrate (IMDUR ) 60 MG 24 hr tablet Take 1.5 tablets (90 mg total) by mouth daily. 01/09/24   Rolan Ezra RAMAN, MD  losartan  (COZAAR ) 100 MG tablet TAKE 1 TABLET (100 MG TOTAL) BY MOUTH EVERY MORNING AND 1/2 TABLET (50 MG TOTAL) EVERY EVENING. 02/19/24   Rolan Ezra RAMAN, MD  Multiple Vitamin (MULTIVITAMIN WITH MINERALS) TABS tablet Take 1 tablet by mouth daily. 09/02/19   Sheth, Marliss SQUIBB, MD  pantoprazole  (PROTONIX ) 40 MG tablet  Take 1 tablet (40 mg total) by mouth daily. 01/08/24   Lee, [place], NP  rosuvastatin  (CRESTOR ) 20 MG tablet Take 1 tablet (20 mg total) by mouth daily. 03/10/24   Milford, Harlene HERO, FNP  spironolactone  (ALDACTONE ) 50 MG tablet Take 1 tablet (50 mg total) by mouth daily. 01/08/24   Lee, [place], NP    Family History Family History  Problem Relation Age of Onset   Heart Problems Mother    Stroke Mother    Heart failure Mother    Hypertension Mother    Heart Problems Father    Heart attack Father    Hypertension Father    Heart Problems Brother    Heart attack Brother    Hypertension Brother    Colon cancer Neg Hx    Esophageal cancer Neg Hx    Stomach cancer Neg Hx    Colon polyps Neg Hx    Rectal cancer Neg Hx     Social History Social History   Tobacco Use   Smoking status: Never   Smokeless tobacco: Never  Vaping Use   Vaping status: Never Used  Substance Use Topics   Alcohol use: Not Currently   Drug use: Never     Allergies   Entresto   [sacubitril-valsartan] and Benazepril    Review of Systems Review of Systems  Constitutional:  Positive for activity change. Negative for appetite change, fatigue and fever.  Musculoskeletal:  Positive for arthralgias, gait problem and joint swelling.  Skin:  Negative for color change and wound.  Neurological:  Negative for weakness and numbness.     Physical Exam Triage Vital Signs ED Triage Vitals [04/20/24 0907]  Encounter Vitals Group     BP (!) 158/93     Girls Systolic BP Percentile      Girls Diastolic BP Percentile      Boys Systolic BP Percentile      Boys Diastolic BP Percentile      Pulse Rate (!) 55     Resp 16     Temp 98.1 F (36.7 C)     Temp Source Oral     SpO2 97 %     Weight      Height      Head Circumference      Peak Flow      Pain Score 10     Pain Loc      Pain Education      Exclude from Growth Chart    No data found.  Updated Vital Signs BP (!) 158/93 (BP Location: Left Arm)   Pulse (!) 55   Temp 98.1 F (36.7 C) (Oral)   Resp 16   SpO2 97%   Visual Acuity Right Eye Distance:   Left Eye Distance:   Bilateral Distance:    Right Eye Near:   Left Eye Near:    Bilateral Near:     Physical Exam Vitals reviewed.  Constitutional:      General: He is awake.     Appearance: Normal appearance. He is well-developed. He is not ill-appearing.     Comments: Very pleasant male appears stated age in no acute distress sitting comfortably in exam room  HENT:     Head: Normocephalic and atraumatic.  Cardiovascular:     Rate and Rhythm: Normal rate and regular rhythm.     Heart sounds: Normal heart sounds, S1 normal and S2 normal. No murmur heard.    Comments: Capillary fill within 2 seconds right toes Pulmonary:  Effort: Pulmonary effort is normal.     Breath sounds: Normal breath sounds. No stridor. No wheezing, rhonchi or rales.     Comments: Clear to auscultation bilaterally Musculoskeletal:     Right ankle: Swelling present. No  deformity. Tenderness present over the lateral malleolus. Decreased range of motion.     Right Achilles Tendon: No tenderness. Thompson's test negative.     Comments: Right ankle: Tender to palpation over lateral malleolus without deformity.  Foot is neurovascularly intact.  Normal dorsiflexion and plantarflexion but significant pain with attempted inversion.  Feet:     Right foot:     Protective Sensation: 10 sites tested.  10 sites sensed.     Skin integrity: No ulcer, blister or skin breakdown.     Toenail Condition: Right toenails are abnormally thick.  Neurological:     Mental Status: He is alert.  Psychiatric:        Behavior: Behavior is cooperative.      UC Treatments / Results  Labs (all labs ordered are listed, but only abnormal results are displayed) Labs Reviewed - No data to display  EKG   Radiology DG Ankle Complete Right Result Date: 04/20/2024 EXAM: 3 or more VIEW(S) XRAY OF THE RIGHT ANKLE 04/20/2024 09:28:28 AM CLINICAL HISTORY: Pain, swelling. Patient here to today with c/o right ankle pain X 5 days. Patient states that he was loading a trailer and a piece of wood fell down and hit the lateral side of his right ankle. Patient has been having increased pain with weightbearing. Patient has some swelling. Patient has been taking Tylenol  with no relief. COMPARISON: Foot radiographs of 11/09/2021. FINDINGS: BONES AND JOINTS: No acute fracture. No joint dislocation. Tiny calcaneal spur. Tiny ossific density distal to the medial malleolus is likely related to remote trauma. SOFT TISSUES: Vascular calcifications. IMPRESSION: 1. No acute osseous abnormality. Electronically signed by: Rockey Kilts MD 04/20/2024 10:10 AM EDT RP Workstation: [id number]    Procedures Procedures (including critical care time)  Medications Ordered in UC Medications - No data to display  Initial Impression / Assessment and Plan / UC Course  I have reviewed the triage vital signs and the nursing  notes.  Pertinent labs & imaging results that were available during my care of the patient were reviewed by me and considered in my medical decision making (see chart for details).     Patient is well-appearing, afebrile, nontoxic, nontachycardic.  X-ray was obtained given mechanism of injury that showed no acute osseous abnormality concerning for contusion versus sprain as etiology of symptoms.  Patient was encouraged to use RICE protocol and was placed in a brace for comfort and support.  He is limited in the over-the-counter medications that can be given given his history of heart disease and chronic anticoagulation and so was encouraged to continue the Tylenol  that was previously recommended and we will apply topical lidocaine  to the area that is bothersome to help with additional pain relief.  Recommended that he follow-up with [facility] for symptoms or not improving quickly.  We discussed that if anything worsens or changes and he has increasing pain, difficulty ambulating, numbness or tingling in the foot he needs to be seen emergently.  Strict return precautions given.  Excuse note provided.  Final Clinical Impressions(s) / UC Diagnoses   Final diagnoses:  Contusion of right ankle, initial encounter  Acute right ankle pain     Discharge Instructions      Your x-ray did not show any  broken bones which is great news.  Keep your foot elevated and apply ice for 15 minutes at a time 3-4 times a day.  Apply lidocaine  on the outside of the skin to help with the pain.  You can continue Tylenol  for additional pain relief.  Use the brace for comfort and support.  Follow-up with [facility] if your symptoms are not improving within a few days.  Call them to schedule an appointment.  If anything worsens and you have increasing pain, difficulty walking, numbness or tingling in the foot you should be seen immediately.     ED Prescriptions     Medication Sig Dispense Auth.  Provider   lidocaine  (XYLOCAINE ) 5 % ointment Apply 1 Application topically 3 (three) times daily as needed. 50 g Tarique Loveall K, PA-C      PDMP not reviewed this encounter.   Sherrell Rocky POUR, PA-C 04/20/24 1030

## 2024-04-20 NOTE — Discharge Instructions (Signed)
 Your x-ray did not show any broken bones which is great news.  Keep your foot elevated and apply ice for 15 minutes at a time 3-4 times a day.  Apply lidocaine  on the outside of the skin to help with the pain.  You can continue Tylenol  for additional pain relief.  Use the brace for comfort and support.  Follow-up with [facility] if your symptoms are not improving within a few days.  Call them to schedule an appointment.  If anything worsens and you have increasing pain, difficulty walking, numbness or tingling in the foot you should be seen immediately.

## 2024-04-22 ENCOUNTER — Telehealth (HOSPITAL_COMMUNITY): Payer: Self-pay | Admitting: *Deleted

## 2024-04-22 ENCOUNTER — Ambulatory Visit (INDEPENDENT_AMBULATORY_CARE_PROVIDER_SITE_OTHER)

## 2024-04-22 ENCOUNTER — Ambulatory Visit: Admitting: Podiatry

## 2024-04-22 DIAGNOSIS — S9001XA Contusion of right ankle, initial encounter: Secondary | ICD-10-CM

## 2024-04-22 DIAGNOSIS — M7751 Other enthesopathy of right foot: Secondary | ICD-10-CM | POA: Diagnosis not present

## 2024-04-22 DIAGNOSIS — M7752 Other enthesopathy of left foot: Secondary | ICD-10-CM | POA: Diagnosis not present

## 2024-04-22 NOTE — Telephone Encounter (Signed)
 Called pt he states his foot is hurting first thing in the morning and tylenol  does help during the day. He asked if he could take advil. I advised him due to being on blood thinners he was only able to take tylneol. I advised him to rice , rest and follow up with foot center as directed on his paperwork . He states he will call them this morning.

## 2024-04-23 NOTE — Progress Notes (Signed)
 Subjective:   Patient ID: Daniel Joseph, male   DOB: 63 y.o.   MRN: [id number]   HPI Patient states that he traumatized his right ankle and now it feels like the tendon is inflamed and this occurred approximately 1 week ago.  Patient states that he is able to walk but it is sore.  Patient does not smoke likes to be active   Review of Systems  All other systems reviewed and are negative.       Objective:  Physical Exam Vitals and nursing note reviewed.  Constitutional:      Appearance: He is well-developed.  Pulmonary:     Effort: Pulmonary effort is normal.  Musculoskeletal:        General: Normal range of motion.  Skin:    General: Skin is warm.  Neurological:     Mental Status: He is alert.     Neurovascular status intact muscle strength was found to be adequate range of motion within normal limits with inflammation of the lateral side of the right ankle with no indication of range of motion loss.  There is edema in the area is localized to this area and patient is able to walk on his foot currently.  Good digital perfusion well-oriented     Assessment:  Trauma to the tendon group and into the bone structure of the right lateral foot     Plan:  H&P x-rays taken reviewed and at this point due to the swelling I did apply Unna boot Ace wrap surgical shoe instructions to wear it 3 days or take it off earlier if any pathology were to occur.  This should resolve gradually over the next 4 to 6 weeks but if there is issues he is to reappoint for us  to reevaluate  X-rays indicate no signs of fracture no signs of diastases injury associated with intermittent

## 2024-05-05 ENCOUNTER — Ambulatory Visit (HOSPITAL_COMMUNITY)
Admission: RE | Admit: 2024-05-05 | Discharge: 2024-05-05 | Disposition: A | Discharge status: Home / Self Care | Source: Ambulatory Visit | Attending: Cardiology | Admitting: Cardiology

## 2024-05-05 DIAGNOSIS — I5032 Chronic diastolic (congestive) heart failure: Secondary | ICD-10-CM | POA: Diagnosis present

## 2024-05-05 LAB — HEPATIC FUNCTION PANEL
ALT: 25 U/L (ref 0–44)
AST: 24 U/L (ref 15–41)
Albumin: 3.5 g/dL (ref 3.5–5.0)
Alkaline Phosphatase: 57 U/L (ref 38–126)
Bilirubin, Direct: 0.1 mg/dL (ref 0.0–0.2)
Indirect Bilirubin: 0.6 mg/dL (ref 0.3–0.9)
Total Bilirubin: 0.7 mg/dL (ref 0.0–1.2)
Total Protein: 7.1 g/dL (ref 6.5–8.1)

## 2024-05-05 LAB — LIPID PANEL
Cholesterol: 185 mg/dL (ref 0–200)
HDL: 68 mg/dL (ref >40)
LDL Cholesterol: 106 mg/dL — ABNORMAL HIGH (ref 0–99)
Total CHOL/HDL Ratio: 2.7 ratio
Triglycerides: 57 mg/dL (ref <150)
VLDL: 11 mg/dL (ref 0–40)

## 2024-05-07 ENCOUNTER — Other Ambulatory Visit (HOSPITAL_COMMUNITY): Payer: Self-pay | Admitting: Cardiology

## 2024-05-12 ENCOUNTER — Encounter (HOSPITAL_COMMUNITY): Payer: Self-pay

## 2024-05-12 ENCOUNTER — Ambulatory Visit (HOSPITAL_COMMUNITY)
Admission: EM | Admit: 2024-05-12 | Discharge: 2024-05-12 | Disposition: A | Discharge status: Home / Self Care | Attending: Internal Medicine | Admitting: Internal Medicine

## 2024-05-12 DIAGNOSIS — G5701 Lesion of sciatic nerve, right lower limb: Secondary | ICD-10-CM

## 2024-05-12 MED ORDER — METHYLPREDNISOLONE 4 MG PO TBPK: ORAL_TABLET | ORAL | 0 refills | Status: DC

## 2024-05-12 NOTE — ED Provider Notes (Signed)
 [facility]    CSN: [id number] Arrival date & time: 05/12/24  0805      History   Chief Complaint Chief Complaint  Patient presents with   Hip Pain    HPI Daniel Joseph is a 63 y.o. male here today for right posterior hip pain x 2 days.  He endorses acute onset of pain 2 days ago.  No specific trauma or inciting event.  He started ambulating with a cane today because of pain.  Denies radiation of pain down the right leg or to his back.  Pain is worse with motion in general.  Denies numbness/weakness in the right lower extremity.  This has not happened previously.  He has not tried taking any medication for pain relief.  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, PASP 79   Gout    H/O atrial flutter 05/2020   but back to NSR   Mitral valve regurgitation    mild to moderate MR with MVP of ANMVL by echo 2019   PVC's (premature ventricular contractions)    nonsustained VT as well as PVCs - no ICD indicated due to EF 40-45%   Sleep apnea    pending second sleep study    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    Patient Active Problem List   Diagnosis Date Noted   Adenopathy 03/14/2021   Neuropathy 02/02/2020   Nausea 02/02/2020   Acute exacerbation of CHF (congestive heart failure) (HCC) 08/29/2019   Alcohol abuse 08/29/2019   Hypoxic Respiratory failure, acute (HCC) 08/29/2019   PVC's (premature ventricular contractions)    HTN (hypertension) 03/02/2014   Chronic combined systolic  and diastolic CHF (congestive heart failure) (HCC) 03/02/2014   Hypertensive cardiomyopathy (HCC) 03/02/2014   CKD (chronic kidney disease), stage III (HCC) 02/15/2014   Neck nodule 02/01/2014   Eye redness 02/01/2014   Mitral valve regurgitation 01/21/2014   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    Past Surgical History:  Procedure Laterality Date   ABDOMINAL SURGERY  at birth   BRONCHIAL NEEDLE ASPIRATION BIOPSY  03/29/2021   Procedure: BRONCHIAL NEEDLE ASPIRATION BIOPSIES;  Surgeon: Brenna Adine CROME, DO;  [facility];  Service: Pulmonary;;   BUBBLE STUDY  03/16/2019   Procedure: BUBBLE STUDY;  Surgeon: Rolan Ezra RAMAN, MD;  Location: [facility] ENDOSCOPY;  Service: Cardiovascular;;   CARDIAC CATHETERIZATION     CARDIAC VALVE REPLACEMENT     CARDIOVERSION N/A 06/08/2020   Procedure: CARDIOVERSION;  Surgeon: Rolan Ezra RAMAN, MD;  Location: [facility] ENDOSCOPY;  Service: Cardiovascular;  Laterality: N/A;   CARDIOVERSION N/A 12/25/2023   Procedure: CARDIOVERSION;  Surgeon: Rolan Ezra RAMAN, MD;  [facility] LAB;  Service: Cardiovascular;  Laterality: N/A;   RIGHT/LEFT HEART CATH AND CORONARY ANGIOGRAPHY N/A 03/16/2019   Procedure: RIGHT/LEFT HEART CATH AND CORONARY ANGIOGRAPHY;  Surgeon: Rolan Ezra RAMAN, MD;  Location: [facility] INVASIVE CV LAB;  Service: Cardiovascular;  Laterality: N/A;   RIGHT/LEFT HEART CATH  AND CORONARY ANGIOGRAPHY N/A 11/09/2020   Procedure: RIGHT/LEFT HEART CATH AND CORONARY ANGIOGRAPHY;  Surgeon: Rolan Ezra RAMAN, MD;  Location: [facility] INVASIVE CV LAB;  Service: Cardiovascular;  Laterality: N/A;   TEE WITHOUT CARDIOVERSION N/A 01/25/2014   Procedure: TRANSESOPHAGEAL ECHOCARDIOGRAM (TEE);  Surgeon: Aleene JINNY Passe, MD;  Location: [facility] ENDOSCOPY;  Service: Cardiovascular;  Laterality: N/A;   TEE WITHOUT CARDIOVERSION N/A 09/14/2014   Procedure: TRANSESOPHAGEAL ECHOCARDIOGRAM (TEE);  Surgeon: Wilbert JONELLE Bihari, MD;  Location: [facility] ENDOSCOPY;  Service: Cardiovascular;  Laterality:  N/A;   TEE WITHOUT CARDIOVERSION N/A 03/16/2019   Procedure: TRANSESOPHAGEAL ECHOCARDIOGRAM (TEE);  Surgeon: Rolan Ezra RAMAN, MD;  Location: [facility] ENDOSCOPY;  Service: Cardiovascular;  Laterality: N/A;   TEE WITHOUT CARDIOVERSION N/A 06/08/2020   Procedure: TRANSESOPHAGEAL ECHOCARDIOGRAM (TEE);  Surgeon: Rolan Ezra RAMAN, MD;  Location: [facility] ENDOSCOPY;  Service: Cardiovascular;  Laterality: N/A;   VIDEO BRONCHOSCOPY WITH ENDOBRONCHIAL ULTRASOUND N/A 03/29/2021   Procedure: VIDEO BRONCHOSCOPY WITH ENDOBRONCHIAL ULTRASOUND;  Surgeon: Brenna Adine CROME, DO;  Location: [facility];  Service: Pulmonary;  Laterality: N/A;       Home Medications    Prior to Admission medications   Medication Sig Start Date End Date Taking? Authorizing Provider  allopurinol  (ZYLOPRIM ) 100 MG tablet TAKE 2 TABLETS BY MOUTH ONCE DAILY (AM) 11/21/23  Yes Rolan Ezra RAMAN, MD  amiodarone  (PACERONE ) 200 MG tablet Take 1 tablet (200 mg total) by mouth daily. 03/10/24  Yes Milford, Harlene HERO, FNP  amLODipine  (NORVASC ) 10 MG tablet Take 1 tablet (10 mg total) by mouth daily. 01/07/24  Yes Rolan Ezra RAMAN, MD  apixaban  (ELIQUIS ) 5 MG TABS tablet Take 1 tablet (5 mg total) by mouth 2 (two) times daily. 01/08/24  Yes Lee, [place], NP  aspirin  EC 81 MG tablet Take by mouth. 04/01/14  Yes [provider]  carvedilol  (COREG ) 25 MG tablet Take 2 tablets (50 mg total) by mouth in the morning and at bedtime. 01/08/24  Yes Lee, [place], NP  furosemide  (LASIX ) 40 MG tablet TAKE 1 TABLET (40 MG TOTAL) BY MOUTH EVERY MORNING AND 1/2 TABLET (20 MG TOTAL) EVERY EVENING. 03/10/24  Yes Milford, Harlene HERO, FNP  hydrALAZINE  (APRESOLINE ) 50 MG tablet Take 1.5 tablets (75 mg total) by mouth 3 (three) times daily. 03/10/24  Yes Milford, Harlene HERO, FNP  isosorbide  mononitrate (IMDUR ) 60 MG 24 hr tablet Take 1.5 tablets (90 mg total) by mouth daily. 01/09/24  Yes Rolan Ezra RAMAN, MD  losartan  (COZAAR ) 100 MG tablet TAKE 1 TABLET (100 MG TOTAL) BY MOUTH  EVERY MORNING AND 1/2 TABLET (50 MG TOTAL) EVERY EVENING. 05/07/24  Yes Rolan Ezra RAMAN, MD  methylPREDNISolone  (MEDROL  DOSEPAK) 4 MG TBPK tablet Use as directed. 05/12/24  Yes Dessie Delcarlo E, MD  Multiple Vitamin (MULTIVITAMIN WITH MINERALS) TABS tablet Take 1 tablet by mouth daily. 09/02/19  Yes Sheth, Devam P, MD  pantoprazole  (PROTONIX ) 40 MG tablet Take 1 tablet (40 mg total) by mouth daily. 01/08/24  Yes Lee, [place], NP  rosuvastatin  (CRESTOR ) 20 MG tablet Take 1 tablet (20 mg total) by mouth daily. 03/10/24  Yes Milford, Harlene HERO, FNP  spironolactone  (ALDACTONE ) 50 MG tablet Take 1 tablet (50 mg total) by mouth daily. 01/08/24  Yes Lee, [place], NP  acetaminophen  (TYLENOL ) 500 MG tablet Take 1,000 mg by mouth every 6 (six) hours as needed for moderate pain (pain score 4-6).    [provider]  diphenhydrAMINE  (BENADRYL ) 25 MG tablet Take 50 mg by mouth daily as needed for allergies.  [provider]  lidocaine  (XYLOCAINE ) 5 % ointment Apply 1 Application topically 3 (three) times daily as needed. 04/20/24   Raspet, Rocky POUR, PA-C    Family History Family History  Problem Relation Age of Onset   Heart Problems Mother    Stroke Mother    Heart failure Mother    Hypertension Mother    Heart Problems Father    Heart attack Father    Hypertension Father    Heart Problems Brother    Heart attack Brother    Hypertension Brother    Colon cancer Neg Hx    Esophageal cancer Neg Hx    Stomach cancer Neg Hx    Colon polyps Neg Hx    Rectal cancer Neg Hx     Social History Social History   Tobacco Use   Smoking status: Never   Smokeless tobacco: Never  Vaping Use   Vaping status: Never Used  Substance Use Topics   Alcohol use: Not Currently   Drug use: Never     Allergies   Entresto  [sacubitril-valsartan] and Benazepril    Review of Systems Review of Systems  Musculoskeletal:  Positive for arthralgias (Right posterior hip/buttock pain).  All other systems  reviewed and are negative.    Physical Exam Triage Vital Signs ED Triage Vitals  Encounter Vitals Group     BP 05/12/24 0818 136/86     Girls Systolic BP Percentile --      Girls Diastolic BP Percentile --      Boys Systolic BP Percentile --      Boys Diastolic BP Percentile --      Pulse Rate 05/12/24 0818 (!) 54     Resp 05/12/24 0818 18     Temp 05/12/24 0818 (!) 97.4 F (36.3 C)     Temp Source 05/12/24 0818 Oral     SpO2 05/12/24 0818 98 %     Weight --      Height --      Head Circumference --      Peak Flow --      Pain Score 05/12/24 0820 10     Pain Loc --      Pain Education --      Exclude from Growth Chart --    No data found.  Updated Vital Signs BP 136/86 (BP Location: Left Arm)   Pulse (!) 54   Temp (!) 97.4 F (36.3 C) (Oral)   Resp 18   SpO2 98%   Visual Acuity Right Eye Distance:   Left Eye Distance:   Bilateral Distance:    Right Eye Near:   Left Eye Near:    Bilateral Near:     Physical Exam Vitals reviewed.  Constitutional:      General: He is not in acute distress.    Appearance: Normal appearance. He is normal weight. He is not toxic-appearing.  Musculoskeletal:     Comments: Right hip No obvious deformity inspection There is mild tenderness to palpation over the right buttock.  No TTP over the greater trochanter. Active range of motion generally intact  Pain elicited with resisted flexion and abduction Right lower extremity is grossly neurovascularly intact   Skin:    General: Skin is warm and dry.  Neurological:     Mental Status: He is alert.     Gait: Gait abnormal (Antalgic gait, ambulates with a cane).    UC Treatments / Results  Labs (all labs ordered are listed, but only abnormal results are  displayed) Labs Reviewed - No data to display  EKG   Radiology No results found.  Procedures Procedures (including critical care time)  Medications Ordered in UC Medications - No data to display  Initial Impression  / Assessment and Plan / UC Course  I have reviewed the triage vital signs and the nursing notes.  Pertinent labs & imaging results that were available during my care of the patient were reviewed by me and considered in my medical decision making (see chart for details).    63 year old male presenting to urgent care endorsing acute onset of right posterior hip and buttock pain.  On exam there is tenderness to palpation over piriformis.  Range of motion is generally intact.  No tenderness palpation of the greater trochanter.  History exam findings are concerning for piriformis syndrome.  Treatment options reviewed.  NSAIDs contraindicated given that he is currently on oral anticoagulation.  Recommend steroid taper and home PT exercises.  Medrol  Dosepak prescribed today.  He was given rehabilitation exercises for piriformis syndrome as well.  He was instructed to return to care if symptoms worsen or fail to improve.  Patient is agreeable to this plan.  He is medically stable for discharge at this time.  Final Clinical Impressions(s) / UC Diagnoses   Final diagnoses:  Piriformis syndrome, right     Discharge Instructions      You have inflammation in the muscles around your hip. As we discussed, I prescribed a steroid taper to help with pain relief. Please see the attached rehab exercises for your hip as they will also help with healing and strengthening. Please return to care if pain worsens or does not improve.     ED Prescriptions     Medication Sig Dispense Auth. Provider   methylPREDNISolone  (MEDROL  DOSEPAK) 4 MG TBPK tablet Use as directed. 21 each Melvenia Manus BRAVO, MD      PDMP not reviewed this encounter.   Melvenia Manus BRAVO, MD 05/12/24 [phone]

## 2024-05-12 NOTE — ED Triage Notes (Signed)
 Patient is here for right hip pain radiating down to his right leg. Patient pain is worse with movement. Pain 101/10

## 2024-05-12 NOTE — Discharge Instructions (Addendum)
 You have inflammation in the muscles around your hip. As we discussed, I prescribed a steroid taper to help with pain relief. Please see the attached rehab exercises for your hip as they will also help with healing and strengthening. Please return to care if pain worsens or does not improve.

## 2024-05-30 ENCOUNTER — Ambulatory Visit (HOSPITAL_COMMUNITY): Admission: EM | Admit: 2024-05-30 | Discharge: 2024-05-30 | Disposition: A | Discharge status: Home / Self Care

## 2024-05-30 ENCOUNTER — Encounter (HOSPITAL_COMMUNITY): Payer: Self-pay

## 2024-05-30 ENCOUNTER — Ambulatory Visit (HOSPITAL_COMMUNITY)

## 2024-05-30 DIAGNOSIS — U071 COVID-19: Secondary | ICD-10-CM | POA: Diagnosis not present

## 2024-05-30 LAB — POC COVID19/FLU A&B COMBO
Covid Antigen, POC: POSITIVE — AB
Influenza A Antigen, POC: NEGATIVE
Influenza B Antigen, POC: NEGATIVE

## 2024-05-30 MED ORDER — AZELASTINE HCL 0.1 % NA SOLN: 1.0000 | Freq: Two times a day (BID) | NASAL | 1 refills | Status: AC

## 2024-05-30 MED ORDER — IPRATROPIUM-ALBUTEROL 0.5-2.5 (3) MG/3ML IN SOLN
3.0000 mL | Freq: Once | RESPIRATORY_TRACT | Status: AC
  Administered 2024-05-30: 3 mL via RESPIRATORY_TRACT

## 2024-05-30 MED ORDER — IPRATROPIUM-ALBUTEROL 0.5-2.5 (3) MG/3ML IN SOLN
RESPIRATORY_TRACT | Status: AC
Start: 2024-05-30 — End: 2024-05-30
  Filled 2024-05-30: qty 3

## 2024-05-30 MED ORDER — ALBUTEROL SULFATE HFA 108 (90 BASE) MCG/ACT IN AERS: 1.0000 | INHALATION_SPRAY | Freq: Four times a day (QID) | RESPIRATORY_TRACT | 0 refills | Status: AC | PRN

## 2024-05-30 MED ORDER — PREDNISONE 20 MG PO TABS: 40.0000 mg | ORAL_TABLET | Freq: Every day | ORAL | 0 refills | Status: AC

## 2024-05-30 NOTE — ED Triage Notes (Signed)
 Pr having cough, chest congestion, and SOB onset 3 days ago. No known sick exposure. There were chill on day one but not recently.   Patient tried tylenol  with no relief.

## 2024-05-30 NOTE — Discharge Instructions (Addendum)
  1. COVID-19 (Primary) - POC Covid19/Flu A&B Antigen complete in UC is positive for COVID-19, negative for influenza - DG Chest 2 View x-ray completed in UC shows no acute cardiopulmonary processes, no sign of consolidation or pneumonia. - ipratropium-albuterol  (DUONEB) 0.5-2.5 (3) MG/3ML nebulizer solution 3 mL complain UC for acute wheezing secondary to viral illness - albuterol  (VENTOLIN  HFA) 108 (90 Base) MCG/ACT inhaler; Inhale 1-2 puffs into the lungs every 6 (six) hours as needed for wheezing or shortness of breath.  Dispense: 6.7 g; Refill: 0 - predniSONE  (DELTASONE ) 20 MG tablet; Take 2 tablets (40 mg total) by mouth daily for 5 days.  Dispense: 10 tablet; Refill: 0 - azelastine  (ASTELIN ) 0.1 % nasal spray; Place 1 spray into both nostrils 2 (two) times daily. Use in each nostril as directed  Dispense: 30 mL; Refill: 1 -Continue to monitor symptoms for any change in severity if there is any escalation of current symptoms or development of new symptoms follow-up in ER for further evaluation and management.

## 2024-05-30 NOTE — ED Provider Notes (Signed)
 [facility] [place]  Note:  This document was prepared using Conservation officer, historic buildings and may include unintentional dictation errors.  MRN: [id number] DOB: 1961/08/08  Subjective:   Daniel Joseph is a 63 y.o. male presenting for cough, nasal congestion, chest congestion, mild dyspnea x 3 days.  Patient denies any known sick contacts.  Did have cough and possible fevers on the first day but no chills or fever since day 1.  Patient has been taking Tylenol  with mild relief.  No severe shortness of breath, weakness, dizziness, chest pain.  No current facility-administered medications for this encounter.  Current Outpatient Medications:    acetaminophen  (TYLENOL ) 500 MG tablet, Take 1,000 mg by mouth every 6 (six) hours as needed for moderate pain (pain score 4-6)., Disp: , Rfl:    albuterol  (VENTOLIN  HFA) 108 (90 Base) MCG/ACT inhaler, Inhale 1-2 puffs into the lungs every 6 (six) hours as needed for wheezing or shortness of breath., Disp: 6.7 g, Rfl: 0   allopurinol  (ZYLOPRIM ) 100 MG tablet, TAKE 2 TABLETS BY MOUTH ONCE DAILY (AM), Disp: 180 tablet, Rfl: 0   amiodarone  (PACERONE ) 200 MG tablet, Take 1 tablet (200 mg total) by mouth daily., Disp: 30 tablet, Rfl: 5   amLODipine  (NORVASC ) 10 MG tablet, Take 1 tablet (10 mg total) by mouth daily., Disp: 30 tablet, Rfl: 5   apixaban  (ELIQUIS ) 5 MG TABS tablet, Take 1 tablet (5 mg total) by mouth 2 (two) times daily., Disp: 60 tablet, Rfl: 5   aspirin  EC 81 MG tablet, Take by mouth., Disp: , Rfl:    azelastine  (ASTELIN ) 0.1 % nasal spray, Place 1 spray into both nostrils 2 (two) times daily. Use in each nostril as directed, Disp: 30 mL, Rfl: 1   carvedilol  (COREG ) 25 MG tablet, Take 2 tablets (50 mg total) by mouth in the morning and at bedtime., Disp: 180 tablet, Rfl: 3   diphenhydrAMINE  (BENADRYL ) 25 MG tablet, Take 50 mg by mouth daily as needed for allergies., Disp: , Rfl:    furosemide  (LASIX ) 40 MG tablet, TAKE 1 TABLET (40  MG TOTAL) BY MOUTH EVERY MORNING AND 1/2 TABLET (20 MG TOTAL) EVERY EVENING., Disp: 45 tablet, Rfl: 3   hydrALAZINE  (APRESOLINE ) 50 MG tablet, Take 1.5 tablets (75 mg total) by mouth 3 (three) times daily., Disp: 135 tablet, Rfl: 5   isosorbide  mononitrate (IMDUR ) 60 MG 24 hr tablet, Take 1.5 tablets (90 mg total) by mouth daily., Disp: 45 tablet, Rfl: 6   lidocaine  (XYLOCAINE ) 5 % ointment, Apply 1 Application topically 3 (three) times daily as needed., Disp: 50 g, Rfl: 0   losartan  (COZAAR ) 100 MG tablet, TAKE 1 TABLET (100 MG TOTAL) BY MOUTH EVERY MORNING AND 1/2 TABLET (50 MG TOTAL) EVERY EVENING., Disp: 45 tablet, Rfl: 1   Multiple Vitamin (MULTIVITAMIN WITH MINERALS) TABS tablet, Take 1 tablet by mouth daily., Disp: 30 tablet, Rfl: 0   pantoprazole  (PROTONIX ) 40 MG tablet, Take 1 tablet (40 mg total) by mouth daily., Disp: 90 tablet, Rfl: 3   predniSONE  (DELTASONE ) 20 MG tablet, Take 2 tablets (40 mg total) by mouth daily for 5 days., Disp: 10 tablet, Rfl: 0   rosuvastatin  (CRESTOR ) 20 MG tablet, Take 1 tablet (20 mg total) by mouth daily., Disp: 90 tablet, Rfl: 3   spironolactone  (ALDACTONE ) 50 MG tablet, Take 1 tablet (50 mg total) by mouth daily., Disp: 30 tablet, Rfl: 5   Allergies  Allergen Reactions   Entresto  [Sacubitril-Valsartan] Cough   Benazepril  Cough  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, PASP 79   Gout    H/O atrial flutter 05/2020   but back to NSR   Mitral valve regurgitation    mild to  moderate MR with MVP of ANMVL by echo 2019   PVC's (premature ventricular contractions)    nonsustained VT as well as PVCs - no ICD indicated due to EF 40-45%   Sleep apnea    pending second sleep study    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     Past Surgical History:  Procedure Laterality Date   ABDOMINAL SURGERY  at birth   BRONCHIAL NEEDLE ASPIRATION BIOPSY  03/29/2021   Procedure: BRONCHIAL NEEDLE ASPIRATION BIOPSIES;  Surgeon: Brenna Adine CROME, DO;  [facility]: [facility] END[facility];  Service: Pulmonary;;   BUBBLE STUDY  03/16/2019   Procedure: BUBBLE STUDY;  Surgeon: Rolan Ezra RAMAN, MD;  Location: [facility] ENDOSCOPY;  Service: Cardiovascular;;   CARDIAC CATHETERIZATION     CARDIAC VALVE REPLACEMENT     CARDIOVERSION N/A 06/08/2020   Procedure: CARDIOVERSION;  Surgeon: Rolan Ezra RAMAN, MD;  Location: [facility] ENDOSCOPY;  Service: Cardiovascular;  Laterality: N/A;   CARDIOVERSION N/A 12/25/2023   Procedure: CARDIOVERSION;  Surgeon: Rolan Ezra RAMAN, MD;  [facility] INVASIVE CV LAB;  Service: Cardiovascular;  Laterality: N/A;   RIGHT/LEFT HEART CATH AND CORONARY ANGIOGRAPHY N/A 03/16/2019   Procedure: RIGHT/LEFT HEART CATH AND CORONARY ANGIOGRAPHY;  Surgeon: Rolan Ezra RAMAN, MD;  Location: [facility] INVASIVE CV LAB;  Service: Cardiovascular;  Laterality: N/A;   RIGHT/LEFT HEART CATH AND CORONARY ANGIOGRAPHY N/A 11/09/2020   Procedure: RIGHT/LEFT HEART CATH AND CORONARY ANGIOGRAPHY;  Surgeon: Rolan Ezra RAMAN, MD;  Location: [facility] INVASIVE CV LAB;  Service: Cardiovascular;  Laterality: N/A;   TEE WITHOUT CARDIOVERSION N/A 01/25/2014   Procedure: TRANSESOPHAGEAL ECHOCARDIOGRAM (TEE);  Surgeon: Aleene JINNY Passe, MD;  Location: [facility] ENDOSCOPY;  Service: Cardiovascular;  Laterality: N/A;   TEE WITHOUT CARDIOVERSION N/A 09/14/2014   Procedure: TRANSESOPHAGEAL ECHOCARDIOGRAM (TEE);  Surgeon: Wilbert JONELLE Bihari, MD;  Location: [facility] ENDOSCOPY;  Service: Cardiovascular;  Laterality: N/A;   TEE WITHOUT CARDIOVERSION  N/A 03/16/2019   Procedure: TRANSESOPHAGEAL ECHOCARDIOGRAM (TEE);  Surgeon: Rolan Ezra RAMAN, MD;  Location: [facility] ENDOSCOPY;  Service: Cardiovascular;  Laterality: N/A;   TEE WITHOUT CARDIOVERSION N/A 06/08/2020   Procedure: TRANSESOPHAGEAL ECHOCARDIOGRAM (TEE);  Surgeon: Rolan Ezra RAMAN, MD;  Location: [facility] ENDOSCOPY;  Service: Cardiovascular;  Laterality: N/A;   VIDEO BRONCHOSCOPY WITH ENDOBRONCHIAL ULTRASOUND N/A 03/29/2021   Procedure: VIDEO BRONCHOSCOPY WITH ENDOBRONCHIAL ULTRASOUND;  Surgeon: Brenna Adine CROME, DO;  Location: [facility] END[facility];  Service: Pulmonary;  Laterality: N/A;    Family History  Problem Relation Age of Onset   Heart Problems Mother    Stroke Mother    Heart failure Mother    Hypertension Mother    Heart Problems Father    Heart attack Father    Hypertension Father    Heart Problems Brother    Heart attack  Brother    Hypertension Brother    Colon cancer Neg Hx    Esophageal cancer Neg Hx    Stomach cancer Neg Hx    Colon polyps Neg Hx    Rectal cancer Neg Hx     Social History   Tobacco Use   Smoking status: Never   Smokeless tobacco: Never  Vaping Use   Vaping status: Never Used  Substance Use Topics   Alcohol use: Not Currently   Drug use: Never    ROS Refer to HPI for ROS details.  Objective:    Vitals: BP (!) 151/88 (BP Location: Right Arm)   Pulse (!) 52   Temp 98 F (36.7 C) (Oral)   Resp 18   Ht 5' 8 (1.727 m)   Wt 225 lb (102.1 kg)   SpO2 96%   BMI 34.21 kg/m   Physical Exam Vitals and nursing note reviewed.  Constitutional:      General: He is not in acute distress.    Appearance: Normal appearance. He is well-developed. He is not ill-appearing or toxic-appearing.  HENT:     Head: Normocephalic.     Nose: Congestion present.     Mouth/Throat:     Mouth: Mucous membranes are moist.     Pharynx: No posterior oropharyngeal erythema.  Cardiovascular:     Rate and Rhythm: Normal rate and regular rhythm.     Heart sounds:  Normal heart sounds. No murmur heard. Pulmonary:     Effort: Pulmonary effort is normal. No respiratory distress.     Breath sounds: No stridor. Wheezing present. No rhonchi or rales.  Chest:     Chest wall: No tenderness.  Skin:    General: Skin is warm and dry.  Neurological:     General: No focal deficit present.     Mental Status: He is alert and oriented to person, place, and time.  Psychiatric:        Mood and Affect: Mood normal.        Behavior: Behavior normal.     Procedures  Results for orders placed or performed during the hospital encounter of 05/30/24 (from the past 24 hours)  POC Covid19/Flu A&B Antigen     Status: Abnormal   Collection Time: 05/30/24  3:39 PM  Result Value Ref Range   Influenza A Antigen, POC Negative Negative   Influenza B Antigen, POC Negative Negative   Covid Antigen, POC Positive (A) Negative    Assessment and Plan :     Discharge Instructions       1. COVID-19 (Primary) - POC Covid19/Flu A&B Antigen complete in UC is positive for COVID-19, negative for influenza - DG Chest 2 View x-ray completed in UC shows no acute cardiopulmonary processes, no sign of consolidation or pneumonia. - ipratropium-albuterol  (DUONEB) 0.5-2.5 (3) MG/3ML nebulizer solution 3 mL complain UC for acute wheezing secondary to viral illness - albuterol  (VENTOLIN  HFA) 108 (90 Base) MCG/ACT inhaler; Inhale 1-2 puffs into the lungs every 6 (six) hours as needed for wheezing or shortness of breath.  Dispense: 6.7 g; Refill: 0 - predniSONE  (DELTASONE ) 20 MG tablet; Take 2 tablets (40 mg total) by mouth daily for 5 days.  Dispense: 10 tablet; Refill: 0 - azelastine  (ASTELIN ) 0.1 % nasal spray; Place 1 spray into both nostrils 2 (two) times daily. Use in each nostril as directed  Dispense: 30 mL; Refill: 1 -Continue to monitor symptoms for any change in severity if there is any escalation of current symptoms  or development of new symptoms follow-up in ER for further  evaluation and management.      Tajanay Hurley B Taria Castrillo   Kathye Cipriani, [place] B, [place] 05/30/24 1658

## 2024-08-06 ENCOUNTER — Other Ambulatory Visit (HOSPITAL_COMMUNITY): Payer: Self-pay | Admitting: Cardiology

## 2024-08-06 ENCOUNTER — Other Ambulatory Visit (HOSPITAL_COMMUNITY): Payer: Self-pay | Admitting: Family Medicine

## 2024-08-06 DIAGNOSIS — I4892 Unspecified atrial flutter: Secondary | ICD-10-CM

## 2024-08-20 ENCOUNTER — Ambulatory Visit (HOSPITAL_COMMUNITY)
Admission: EM | Admit: 2024-08-20 | Discharge: 2024-08-20 | Disposition: A | Discharge status: Home / Self Care | Attending: Emergency Medicine | Admitting: Emergency Medicine

## 2024-08-20 ENCOUNTER — Encounter (HOSPITAL_COMMUNITY): Payer: Self-pay

## 2024-08-20 DIAGNOSIS — R10A1 Flank pain, right side: Secondary | ICD-10-CM

## 2024-08-20 DIAGNOSIS — R350 Frequency of micturition: Secondary | ICD-10-CM

## 2024-08-20 LAB — POCT URINE DIPSTICK
Bilirubin, UA: NEGATIVE
Blood, UA: NEGATIVE
Glucose, UA: NEGATIVE mg/dL
Ketones, POC UA: NEGATIVE mg/dL
Leukocytes, UA: NEGATIVE
Nitrite, UA: NEGATIVE
POC PROTEIN,UA: 30 — AB
Spec Grav, UA: 1.015 (ref 1.010–1.025)
Urobilinogen, UA: 2 U/dL — AB
pH, UA: 7 (ref 5.0–8.0)

## 2024-08-20 MED ORDER — CIPROFLOXACIN HCL 500 MG PO TABS: 500.0000 mg | ORAL_TABLET | Freq: Two times a day (BID) | ORAL | 0 refills | Status: DC

## 2024-08-20 NOTE — ED Provider Notes (Signed)
 [facility]    CSN: [id number] Arrival date & time: 08/20/24  9182      History   Chief Complaint Chief Complaint  Patient presents with   Back Pain   Urinary Frequency    HPI Daniel Joseph is a 63 y.o. male.   Patient presents with right mid to low back pain and urinary frequency/urgency that began 3 to 4 days ago.  Patient states that at times he has felt like he has seen some blood in his urine as well.  Denies abdominal pain, nausea, vomiting, diarrhea, fever, body aches, chills, and weakness.  Patient denies any recent falls or injuries.  The history is provided by the patient and medical records.  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, PASP 79   Gout    H/O atrial flutter 05/2020   but back to NSR   Mitral valve regurgitation    mild to moderate MR with MVP of ANMVL by echo 2019   PVC's (premature ventricular contractions)    nonsustained VT as well as PVCs - no ICD indicated due to EF 40-45%   Sleep apnea    pending second sleep study    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    Patient Active Problem List   Diagnosis Date Noted   Adenopathy 03/14/2021   Neuropathy 02/02/2020   Nausea 02/02/2020   Acute exacerbation of CHF (congestive heart failure) (HCC) 08/29/2019   Alcohol abuse 08/29/2019   Hypoxic Respiratory failure, acute (HCC) 08/29/2019   PVC's (premature ventricular contractions)    HTN (hypertension) 03/02/2014    Chronic combined systolic and diastolic CHF (congestive heart failure) (HCC) 03/02/2014   Hypertensive cardiomyopathy (HCC) 03/02/2014   CKD (chronic kidney disease), stage III (HCC) 02/15/2014   Neck nodule 02/01/2014   Eye redness 02/01/2014   Mitral valve regurgitation 01/21/2014   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    Past Surgical History:  Procedure Laterality Date   ABDOMINAL SURGERY  at birth   BRONCHIAL NEEDLE ASPIRATION BIOPSY  03/29/2021   Procedure: BRONCHIAL NEEDLE ASPIRATION BIOPSIES;  Surgeon: Brenna Adine CROME, DO;  Location: [facility];  Service: Pulmonary;;   BUBBLE STUDY  03/16/2019   Procedure: BUBBLE STUDY;  Surgeon: Rolan Ezra RAMAN, MD;  Location: [facility] ENDOSCOPY;  Service: Cardiovascular;;   CARDIAC CATHETERIZATION     CARDIAC VALVE REPLACEMENT     CARDIOVERSION N/A 06/08/2020   Procedure: CARDIOVERSION;  Surgeon: Rolan Ezra RAMAN, MD;  Location: [facility] ENDOSCOPY;  Service: Cardiovascular;  Laterality: N/A;   CARDIOVERSION N/A 12/25/2023   Procedure: CARDIOVERSION;  Surgeon: Rolan Ezra RAMAN, MD;  Location: [facility] INVASIVE CV LAB;  Service: Cardiovascular;  Laterality: N/A;   RIGHT/LEFT HEART CATH AND CORONARY ANGIOGRAPHY N/A 03/16/2019   Procedure: RIGHT/LEFT HEART CATH AND CORONARY ANGIOGRAPHY;  Surgeon: Rolan Ezra RAMAN, MD;  Location: [facility] INVASIVE CV LAB;  Service: Cardiovascular;  Laterality: N/A;   RIGHT/LEFT HEART CATH  AND CORONARY ANGIOGRAPHY N/A 11/09/2020   Procedure: RIGHT/LEFT HEART CATH AND CORONARY ANGIOGRAPHY;  Surgeon: Rolan Ezra RAMAN, MD;  Location: [facility] INVASIVE CV LAB;  Service: Cardiovascular;  Laterality: N/A;   TEE WITHOUT CARDIOVERSION N/A 01/25/2014   Procedure: TRANSESOPHAGEAL ECHOCARDIOGRAM (TEE);  Surgeon: Aleene JINNY Passe, MD;  Location: [facility] ENDOSCOPY;  Service: Cardiovascular;  Laterality: N/A;   TEE WITHOUT CARDIOVERSION N/A 09/14/2014   Procedure: TRANSESOPHAGEAL ECHOCARDIOGRAM (TEE);  Surgeon: Wilbert JONELLE Bihari, MD;  Location: [facility] ENDOSCOPY;  Service:  Cardiovascular;  Laterality: N/A;   TEE WITHOUT CARDIOVERSION N/A 03/16/2019   Procedure: TRANSESOPHAGEAL ECHOCARDIOGRAM (TEE);  Surgeon: Rolan Ezra RAMAN, MD;  Location: [facility] ENDOSCOPY;  Service: Cardiovascular;  Laterality: N/A;   TEE WITHOUT CARDIOVERSION N/A 06/08/2020   Procedure: TRANSESOPHAGEAL ECHOCARDIOGRAM (TEE);  Surgeon: Rolan Ezra RAMAN, MD;  Location: [facility] ENDOSCOPY;  Service: Cardiovascular;  Laterality: N/A;   VIDEO BRONCHOSCOPY WITH ENDOBRONCHIAL ULTRASOUND N/A 03/29/2021   Procedure: VIDEO BRONCHOSCOPY WITH ENDOBRONCHIAL ULTRASOUND;  Surgeon: Brenna Adine CROME, DO;  Location: [facility]OS[facility];  Service: Pulmonary;  Laterality: N/A;       Home Medications    Prior to Admission medications   Medication Sig Start Date End Date Taking? Authorizing Provider  ciprofloxacin  (CIPRO ) 500 MG tablet Take 1 tablet (500 mg total) by mouth every 12 (twelve) hours. 08/20/24  Yes Johnie Flaming A, NP  acetaminophen  (TYLENOL ) 500 MG tablet Take 1,000 mg by mouth every 6 (six) hours as needed for moderate pain (pain score 4-6).    [provider]  albuterol  (VENTOLIN  HFA) 108 (90 Base) MCG/ACT inhaler Inhale 1-2 puffs into the lungs every 6 (six) hours as needed for wheezing or shortness of breath. 05/30/24   Reddick, Johnathan B, NP  allopurinol  (ZYLOPRIM ) 100 MG tablet TAKE 2 TABLETS BY MOUTH ONCE DAILY (AM) 11/21/23   Rolan Ezra RAMAN, MD  amiodarone  (PACERONE ) 200 MG tablet Take 1 tablet (200 mg total) by mouth daily. 03/10/24   Glena Harlene HERO, FNP  amLODipine  (NORVASC ) 10 MG tablet TAKE 1 TABLET (10 MG TOTAL) BY MOUTH DAILY.(NOON) 08/06/24   McLean, Dalton S, MD  aspirin  EC 81 MG tablet Take by mouth. 04/01/14   [provider]  azelastine  (ASTELIN ) 0.1 % nasal spray Place 1 spray into both nostrils 2 (two) times daily. Use in each nostril as directed 05/30/24   Reddick, Johnathan B, NP  carvedilol  (COREG ) 25 MG tablet TAKE 2 TABLETS (50 MG TOTAL) BY MOUTH IN THE MORNING AND IN  THE EVENING. (2AM+ 2PM) 08/06/24   Lee, Jordan, NP  diphenhydrAMINE  (BENADRYL ) 25 MG tablet Take 50 mg by mouth daily as needed for allergies.    [provider]  ELIQUIS  5 MG TABS tablet TAKE 1 TABLET (5 MG TOTAL) BY MOUTH 2 (TWO) TIMES DAILY. (AM+PM) 08/06/24   Lee, Jordan, NP  furosemide  (LASIX ) 40 MG tablet TAKE 1 TABLET BY MOUTH EVERY MORNING AND 1/2 TABLET EVERY EVENING (1AM+1/2PM) 08/06/24   Milford, Harlene HERO, FNP  hydrALAZINE  (APRESOLINE ) 50 MG tablet Take 1.5 tablets (75 mg total) by mouth 3 (three) times daily. 03/10/24   Glena Harlene HERO, FNP  isosorbide  mononitrate (IMDUR ) 60 MG 24 hr tablet Take 1.5 tablets (90 mg total) by mouth daily. 01/09/24   Rolan Ezra RAMAN, MD  lidocaine  (XYLOCAINE ) 5 % ointment Apply 1 Application topically 3 (three) times daily as needed. 04/20/24   Raspet, Erin K, PA-C  losartan  (COZAAR ) 100 MG tablet TAKE 1 TABLET BY MOUTH EVERY MORNING AND 1/2 TABLET EVERY EVENING(1AM+1/2PM)  08/06/24   Rolan Ezra RAMAN, MD  Multiple Vitamin (MULTIVITAMIN WITH MINERALS) TABS tablet Take 1 tablet by mouth daily. 09/02/19   Sheth, Marliss SQUIBB, MD  pantoprazole  (PROTONIX ) 40 MG tablet Take 1 tablet (40 mg total) by mouth daily. 01/08/24   Lee, Jordan, NP  rosuvastatin  (CRESTOR ) 20 MG tablet Take 1 tablet (20 mg total) by mouth daily. 03/10/24   Glena Harlene HERO, FNP  spironolactone  (ALDACTONE ) 50 MG tablet Take 1 tablet (50 mg total) by mouth daily. 01/08/24   Lee, Jordan, NP    Family History Family History  Problem Relation Age of Onset   Heart Problems Mother    Stroke Mother    Heart failure Mother    Hypertension Mother    Heart Problems Father    Heart attack Father    Hypertension Father    Heart Problems Brother    Heart attack Brother    Hypertension Brother    Colon cancer Neg Hx    Esophageal cancer Neg Hx    Stomach cancer Neg Hx    Colon polyps Neg Hx    Rectal cancer Neg Hx     Social History Social History   Tobacco Use   Smoking status:  Never   Smokeless tobacco: Never  Vaping Use   Vaping status: Never Used  Substance Use Topics   Alcohol use: Not Currently   Drug use: Never     Allergies   Entresto  [sacubitril-valsartan] and Benazepril    Review of Systems Review of Systems  Genitourinary:  Positive for frequency.  Musculoskeletal:  Positive for back pain.   Per HPI  Physical Exam Triage Vital Signs ED Triage Vitals [08/20/24 0831]  Encounter Vitals Group     BP (!) 153/93     Girls Systolic BP Percentile      Girls Diastolic BP Percentile      Boys Systolic BP Percentile      Boys Diastolic BP Percentile      Pulse Rate 85     Resp 16     Temp (!) 97.3 F (36.3 C)     Temp Source Oral     SpO2 97 %     Weight      Height      Head Circumference      Peak Flow      Pain Score 10     Pain Loc      Pain Education      Exclude from Growth Chart    No data found.  Updated Vital Signs BP (!) 153/93 (BP Location: Right Arm)   Pulse 85   Temp (!) 97.3 F (36.3 C) (Oral)   Resp 16   SpO2 97%   Visual Acuity Right Eye Distance:   Left Eye Distance:   Bilateral Distance:    Right Eye Near:   Left Eye Near:    Bilateral Near:     Physical Exam Vitals and nursing note reviewed.  Constitutional:      General: He is awake. He is not in acute distress.    Appearance: Normal appearance. He is well-developed and well-groomed. He is not ill-appearing.  Abdominal:     General: Abdomen is flat. Bowel sounds are normal.     Palpations: Abdomen is soft.     Tenderness: There is no abdominal tenderness. There is right CVA tenderness. There is no left CVA tenderness.  Skin:    General: Skin is warm and dry.  Neurological:  Mental Status: He is alert.  Psychiatric:        Behavior: Behavior is cooperative.      UC Treatments / Results  Labs (all labs ordered are listed, but only abnormal results are displayed) Labs Reviewed  POCT URINE DIPSTICK - Abnormal; Notable for the following  components:      Result Value   POC PROTEIN,UA =30 (*)    Urobilinogen, UA 2.0 (*)    All other components within normal limits  URINE CULTURE    EKG   Radiology No results found.  Procedures Procedures (including critical care time)  Medications Ordered in UC Medications - No data to display  Initial Impression / Assessment and Plan / UC Course  I have reviewed the triage vital signs and the nursing notes.  Pertinent labs & imaging results that were available during my care of the patient were reviewed by me and considered in my medical decision making (see chart for details).     Patient is overall well-appearing.  Vitals are stable.  Right CVA tenderness present on exam.  Urinalysis unremarkable, will send urine culture to confirm presence of urinary tract infection.  Empirically treating with ciprofloxacin  for suspicion of pyelonephritis due to symptoms and positive right CVA tenderness.  Discussed follow-up, return, and strict ER precautions. Final Clinical Impressions(s) / UC Diagnoses   Final diagnoses:  Urinary frequency  Right flank pain     Discharge Instructions      Start taking ciprofloxacin  twice daily for kidney infection coverage. Your urine culture will return over the next few days and someone will call if results require any adjustment in treatment. Follow-up with your primary care provider or return here as needed If you develop worsening back pain, lots of blood in your urine fever, or severe weakness please seek any medical treatment in the emergency department.   ED Prescriptions     Medication Sig Dispense Auth. Provider   ciprofloxacin  (CIPRO ) 500 MG tablet Take 1 tablet (500 mg total) by mouth every 12 (twelve) hours. 10 tablet Johnie Flaming A, NP      PDMP not reviewed this encounter.   Johnie Flaming A, NP 08/20/24 [phone]

## 2024-08-20 NOTE — ED Triage Notes (Signed)
 Patient c/o right lower back pain and urinary frequency x 3-4 days.  Patient states he has been taking Tylenol  for his pain.

## 2024-08-20 NOTE — Discharge Instructions (Signed)
 Start taking ciprofloxacin  twice daily for kidney infection coverage. Your urine culture will return over the next few days and someone will call if results require any adjustment in treatment. Follow-up with your primary care provider or return here as needed If you develop worsening back pain, lots of blood in your urine fever, or severe weakness please seek any medical treatment in the emergency department.

## 2024-08-21 LAB — URINE CULTURE: Culture: <10000 — AB

## 2024-08-22 ENCOUNTER — Emergency Department (HOSPITAL_COMMUNITY)
Admission: EM | Admit: 2024-08-22 | Discharge: 2024-08-23 | Disposition: A | Attending: Emergency Medicine | Admitting: Emergency Medicine

## 2024-08-22 ENCOUNTER — Other Ambulatory Visit: Payer: Self-pay

## 2024-08-22 ENCOUNTER — Encounter (HOSPITAL_COMMUNITY): Payer: Self-pay | Admitting: *Deleted

## 2024-08-22 DIAGNOSIS — M62838 Other muscle spasm: Secondary | ICD-10-CM

## 2024-08-22 DIAGNOSIS — R10A3 Flank pain, bilateral: Secondary | ICD-10-CM

## 2024-08-22 LAB — COMPREHENSIVE METABOLIC PANEL WITH GFR
ALT: 31 U/L (ref 0–44)
AST: 50 U/L — ABNORMAL HIGH (ref 15–41)
Albumin: 3.3 g/dL — ABNORMAL LOW (ref 3.5–5.0)
Alkaline Phosphatase: 64 U/L (ref 38–126)
Anion gap: 12 (ref 5–15)
BUN: 13 mg/dL (ref 6–20)
CO2: 21 mmol/L — ABNORMAL LOW (ref 22–32)
Calcium: 8.6 mg/dL — ABNORMAL LOW (ref 8.9–10.3)
Chloride: 101 mmol/L (ref 98–111)
Creatinine, Ser: 1.33 mg/dL — ABNORMAL HIGH (ref 0.61–1.24)
GFR, Estimated: >60 mL/min (ref >60)
Glucose, Bld: 90 mg/dL (ref 70–99)
Potassium: 3.3 mmol/L — ABNORMAL LOW (ref 3.5–5.1)
Sodium: 134 mmol/L — ABNORMAL LOW (ref 135–145)
Total Bilirubin: 0.6 mg/dL (ref 0.0–1.2)
Total Protein: 6.7 g/dL (ref 6.5–8.1)

## 2024-08-22 LAB — URINALYSIS, ROUTINE W REFLEX MICROSCOPIC
Bilirubin Urine: NEGATIVE
Glucose, UA: NEGATIVE mg/dL
Hgb urine dipstick: NEGATIVE
Ketones, ur: NEGATIVE mg/dL
Leukocytes,Ua: NEGATIVE
Nitrite: NEGATIVE
Protein, ur: NEGATIVE mg/dL
Specific Gravity, Urine: 1.006 (ref 1.005–1.030)
pH: 5 (ref 5.0–8.0)

## 2024-08-22 LAB — CBC
HCT: 36.8 % — ABNORMAL LOW (ref 39.0–52.0)
Hemoglobin: 12.2 g/dL — ABNORMAL LOW (ref 13.0–17.0)
MCH: 32.6 pg (ref 26.0–34.0)
MCHC: 33.2 g/dL (ref 30.0–36.0)
MCV: 98.4 fL (ref 80.0–100.0)
Platelets: 127 K/uL — ABNORMAL LOW (ref 150–400)
RBC: 3.74 MIL/uL — ABNORMAL LOW (ref 4.22–5.81)
RDW: 13.1 % (ref 11.5–15.5)
WBC: 5.1 K/uL (ref 4.0–10.5)
nRBC: 0 % (ref 0.0–0.2)

## 2024-08-22 LAB — LIPASE, BLOOD: Lipase: 53 U/L — ABNORMAL HIGH (ref 11–51)

## 2024-08-22 MED ORDER — OXYCODONE-ACETAMINOPHEN 5-325 MG PO TABS
ORAL_TABLET | ORAL | Status: AC
  Filled 2024-08-22: qty 1

## 2024-08-22 MED ORDER — OXYCODONE-ACETAMINOPHEN 5-325 MG PO TABS
1.0000 | ORAL_TABLET | Freq: Once | ORAL | Status: AC
  Administered 2024-08-22: 1 via ORAL

## 2024-08-22 NOTE — ED Triage Notes (Signed)
 Pt having right and left flank pain. Was seen at urgent care and given abx.  Has been taking them with no improvement. Pt has had fevers

## 2024-08-23 ENCOUNTER — Emergency Department (HOSPITAL_COMMUNITY)

## 2024-08-23 ENCOUNTER — Ambulatory Visit (HOSPITAL_COMMUNITY): Payer: Self-pay

## 2024-08-23 MED ORDER — LIDOCAINE 5 % EX PTCH: 1.0000 | MEDICATED_PATCH | CUTANEOUS | 0 refills | Status: AC

## 2024-08-23 MED ORDER — KETOROLAC TROMETHAMINE 30 MG/ML IJ SOLN
30.0000 mg | Freq: Once | INTRAMUSCULAR | Status: AC
  Administered 2024-08-23: 30 mg via INTRAMUSCULAR
  Filled 2024-08-23: qty 1

## 2024-08-23 MED ORDER — METHOCARBAMOL 500 MG PO TABS
500.0000 mg | ORAL_TABLET | Freq: Once | ORAL | Status: AC
  Administered 2024-08-23: 500 mg via ORAL
  Filled 2024-08-23: qty 1

## 2024-08-23 MED ORDER — METHOCARBAMOL 500 MG PO TABS: 500.0000 mg | ORAL_TABLET | Freq: Two times a day (BID) | ORAL | 0 refills | Status: AC

## 2024-08-23 NOTE — Discharge Instructions (Signed)
 Please stop any other lidocaine  medications prior to using the lidoderm  patches You can't drive when taking robaxin 

## 2024-08-23 NOTE — ED Provider Notes (Signed)
 [place] EMERGENCY DEPARTMENT AT [facility] Provider Note   CSN: [id number] Arrival date & time: 08/22/24  2141     Patient presents with: Flank Pain (Right and left)   Daniel Joseph is a 63 y.o. male.   The history is provided by the patient.  Flank Pain This is a new problem. The current episode started more than 2 days ago. The problem occurs daily. Pertinent negatives include no chest pain, no abdominal pain and no shortness of breath. Nothing relieves the symptoms.   Patient with extensive history including atrial flutter, cardiomyopathy on anticoagulation presents with ongoing flank pain.  Patient was seen around December 5 for flank pain, was given Cipro  for concern for UTI and discharged.  Since that time he is now having bilateral flank pain denies any falls or trauma.  He has been taking antibiotics without relief.  No fevers or vomiting.  No leg weakness.  No abdominal pain.  Reports urinary frequency but no dysuria.   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 Authorizing Provider  lidocaine  (LIDODERM ) 5 % Place 1 patch onto the skin daily. Remove & Discard patch within 12 hours or as directed by MD 08/23/24  Yes Midge Golas, MD  methocarbamol  (ROBAXIN ) 500 MG tablet Take 1 tablet (500 mg total) by mouth 2 (two) times daily. 08/23/24  Yes Midge Golas, MD  acetaminophen  (TYLENOL ) 500 MG tablet Take 1,000 mg by mouth every 6 (six) hours as needed for moderate pain (pain score 4-6).    [provider]  albuterol  (VENTOLIN  HFA) 108 (90 Base) MCG/ACT inhaler Inhale 1-2 puffs into the lungs every 6 (six) hours as needed for wheezing or shortness of breath. 05/30/24   Reddick, Johnathan B, NP  allopurinol  (ZYLOPRIM ) 100 MG tablet TAKE 2 TABLETS BY MOUTH ONCE DAILY (AM) 11/21/23   Rolan Ezra RAMAN, MD  amiodarone  (PACERONE ) 200 MG tablet Take 1 tablet (200 mg total) by mouth daily. 03/10/24   Glena Harlene HERO, FNP  amLODipine  (NORVASC ) 10 MG tablet TAKE 1 TABLET (10 MG TOTAL) BY MOUTH DAILY.(NOON) 08/06/24   Rolan Ezra RAMAN, MD  aspirin  EC 81 MG tablet Take by mouth. 04/01/14   [provider]  azelastine  (ASTELIN ) 0.1 % nasal spray Place 1 spray into both nostrils 2 (two) times daily. Use in each nostril as directed 05/30/24   Reddick, Johnathan B, NP  carvedilol  (COREG ) 25 MG tablet TAKE 2 TABLETS (50 MG TOTAL) BY MOUTH IN THE MORNING AND IN THE EVENING. (2AM+ 2PM) 08/06/24   Lee, Jordan, NP  diphenhydrAMINE  (BENADRYL ) 25 MG tablet Take 50 mg by mouth daily as needed for allergies.    [provider]  ELIQUIS  5 MG TABS tablet TAKE 1 TABLET (5 MG TOTAL) BY MOUTH 2 (TWO) TIMES DAILY. (AM+PM) 08/06/24   Lee, Jordan, NP  furosemide  (LASIX ) 40 MG tablet TAKE 1 TABLET BY MOUTH EVERY MORNING AND 1/2 TABLET EVERY EVENING (1AM+1/2PM) 08/06/24   Milford, Harlene HERO, FNP  hydrALAZINE  (APRESOLINE ) 50 MG tablet Take 1.5 tablets (75 mg total) by mouth 3 (three) times daily. 03/10/24   Glena Harlene HERO, FNP  isosorbide  mononitrate (IMDUR )  60 MG 24 hr tablet Take 1.5 tablets (90 mg total) by mouth daily. 01/09/24   Rolan Ezra RAMAN, MD  losartan  (COZAAR ) 100 MG tablet TAKE 1 TABLET BY MOUTH EVERY MORNING AND 1/2 TABLET EVERY EVENING(1AM+1/2PM) 08/06/24   McLean, Dalton S, MD  Multiple Vitamin (MULTIVITAMIN WITH MINERALS) TABS tablet Take 1 tablet by mouth daily. 09/02/19   Sheth, Marliss SQUIBB, MD  pantoprazole  (PROTONIX ) 40 MG tablet Take 1 tablet (40 mg total) by mouth daily. 01/08/24   Lee, Jordan, NP  rosuvastatin  (CRESTOR ) 20 MG tablet Take 1 tablet (20 mg total) by mouth daily. 03/10/24   Milford, Harlene HERO, FNP  spironolactone  (ALDACTONE ) 50 MG tablet Take 1 tablet (50 mg total) by mouth daily. 01/08/24   Lee, Jordan, NP    Allergies: Entresto  [sacubitril-valsartan] and Benazepril     Review of Systems  Constitutional:  Negative for fever.  Respiratory:  Negative for shortness of breath.   Cardiovascular:  Negative for chest pain.  Gastrointestinal:  Negative for abdominal pain.  Genitourinary:  Positive for flank pain and frequency. Negative for dysuria.  Neurological:  Negative for weakness and numbness.    Updated Vital Signs BP 135/85 (BP Location: Left Arm)   Pulse 65   Temp 98.2 F (36.8 C) (Oral)   Resp 16   Ht 1.727 m (5' 8)   Wt 102.1 kg   SpO2 100%   BMI 34.22 kg/m   Physical Exam CONSTITUTIONAL: Well developed/well nourished, uncomfortable appearing HEAD: Normocephalic/atraumatic ENMT: Mucous membranes moist NECK: supple no meningeal signs SPINE/BACK:entire spine nontender No bruising/crepitance/stepoffs noted to spine CV: S1/S2 noted LUNGS: Lungs are clear to auscultation bilaterally, no apparent distress ABDOMEN: soft, nontender, no rebound or guarding  GU: Bilateral cva tenderness No rash, no bruising NEURO: Pt is awake/alert/appropriate, moves all extremitiesx4.  No facial droop.  Moves both lower extremities without difficulty EXTREMITIES: pulses normal/equal, full ROM Distal pulses equal and  intact in both feet SKIN: warm, color normal PSYCH: Anxious  (all labs ordered are listed, but only abnormal results are displayed) Labs Reviewed  LIPASE, BLOOD - Abnormal; Notable for the following components:      Result Value   Lipase 53 (*)    All other components within normal limits  COMPREHENSIVE METABOLIC PANEL WITH GFR - Abnormal; Notable for the following components:   Sodium 134 (*)    Potassium 3.3 (*)    CO2 21 (*)    Creatinine, Ser 1.33 (*)    Calcium  8.6 (*)    Albumin 3.3 (*)    AST 50 (*)    All other components within normal limits  CBC - Abnormal; Notable for the following components:   RBC 3.74 (*)    Hemoglobin 12.2 (*)  HCT 36.8 (*)    Platelets 127 (*)    All other components within normal limits  URINALYSIS, ROUTINE W REFLEX MICROSCOPIC    EKG: None  Radiology: CT Renal Stone Study Result Date: 08/23/2024 EXAM: CT UROGRAM 08/23/2024 03:14:00 AM TECHNIQUE: CT of the abdomen and pelvis was performed without the administration of intravenous contrast as per CT urogram protocol. Multiplanar reformatted images as well as MIP urogram images are provided for review. Automated exposure control, iterative reconstruction, and/or weight based adjustment of the mA/kV was utilized to reduce the radiation dose to as low as reasonably achievable. COMPARISON: CT renal 09/16/2020. CLINICAL HISTORY: Abdominal/flank pain, stone suspected. FINDINGS: LOWER CHEST: Partially visualized cardiac leads. Cardiomegaly. LIVER: The liver is unremarkable. GALLBLADDER AND BILE DUCTS: Gallbladder is unremarkable. No biliary ductal dilatation. SPLEEN: No acute abnormality. PANCREAS: No acute abnormality. ADRENAL GLANDS: No acute abnormality. KIDNEYS, URETERS AND BLADDER: No stones in the kidneys or ureters. No hydronephrosis. No perinephric or periureteral stranding. Urinary bladder is unremarkable. GI AND BOWEL: Small hiatal hernia. Stomach demonstrates no acute abnormality. No small or  large bowel thickening or dilatation. The appendix is unremarkable. There is no bowel obstruction. PERITONEUM AND RETROPERITONEUM: No ascites. No free air. VASCULATURE: Aorta is normal in caliber. Small atherosclerotic plaque. LYMPH NODES: No lymphadenopathy. REPRODUCTIVE ORGANS: Unremarkable prostate. BONES AND SOFT TISSUES: Possible tiny fat-containing inguinal hernias. Tiny fat-containing umbilical hernia. No acute osseous abnormality. Multilevel degenerative changes of the spine. IMPRESSION: 1. No acute intra-abdominal or pelvic abnormality. Electronically signed by: Morgane Naveau MD 08/23/2024 03:25 AM EST RP Workstation: [id number]     Procedures   Medications Ordered in the ED  oxyCODONE -acetaminophen  (PERCOCET/ROXICET) 5-325 MG per tablet 1 tablet (1 tablet Oral Given 08/22/24 2202)  methocarbamol  (ROBAXIN ) tablet 500 mg (500 mg Oral Given 08/23/24 0326)  ketorolac  (TORADOL ) 30 MG/ML injection 30 mg (30 mg Intramuscular Given 08/23/24 0326)    Clinical Course as of 08/23/24 0426  Mon Aug 23, 2024  0307 Creatinine(!): 1.33 Mild chronic renal insufficiency [DW]  0315 Patient presents with ongoing flank pain for several days that is worsening and now bilateral.  Recently seen at an urgent care around December 5 and was placed on antibiotics.  Since that time his urine culture was negative.  Today his UA is negative.  Patient appears very uncomfortable bilateral flank pain Imaging ordered.   [DW]  9575 Patient feels improved.  CT imaging does not reveal any acute process. He reports most of the pain is worse with lying down and any movement in bed.  After he was walking around things were improving.  No incontinence is reported, no leg weakness and he is ambulatory  Does appear to be more consistent with a muscle strain.  He was instructed to stop his Cipro .  Will provide Robaxin  for muscle relaxant as well as Lidoderm .  Instructed he cannot drive while taking Robaxin  [DW]    Clinical Course  User Index [DW] Midge Golas, MD                                 Medical Decision Making Amount and/or Complexity of Data Reviewed Labs:  Decision-making details documented in ED Course. Radiology: ordered.  Risk Prescription drug management.   This patient presents to the ED for concern of flank pain, this involves an extensive number of treatment options, and is a complaint that carries with it a high risk of complications and morbidity.  The differential diagnosis includes but is not limited to strain, ureteral colic, pyelonephritis, ruptured AAA  Comorbidities that complicate the patient evaluation: Patient's presentation is complicated by their history of cardiomyopathy  Social Determinants of Health: Patient's previous history of alcohol use  increases the complexity of managing their presentation  Additional history obtained: Records reviewed urgent care notes reviewed  Lab Tests: I Ordered, and personally interpreted labs.  The pertinent results include: Chronic renal insufficiency  Imaging Studies ordered: I ordered imaging studies including CT scan renal  I independently visualized and interpreted imaging which showed no acute findings I agree with the radiologist interpretation  Medicines ordered and prescription drug management: I ordered medication including Robaxin  for muscle spasm Reevaluation of the patient after these medicines showed that the patient    improved  Reevaluation: After the interventions noted above, I reevaluated the patient and found that they have :improved  Complexity of problems addressed: Patient's presentation is most consistent with  acute presentation with potential threat to life or bodily function  Disposition: After consideration of the diagnostic results and the patient's response to treatment,  I feel that the patent would benefit from discharge  .        Final diagnoses:  Bilateral flank pain  Muscle spasm    ED  Discharge Orders          Ordered    methocarbamol  (ROBAXIN ) 500 MG tablet  2 times daily        08/23/24 0420    lidocaine  (LIDODERM ) 5 %  Every 24 hours        08/23/24 0420               Midge Golas, MD 08/23/24 [phone]

## 2024-09-13 ENCOUNTER — Other Ambulatory Visit (HOSPITAL_COMMUNITY): Payer: Self-pay | Admitting: Cardiology

## 2024-10-14 ENCOUNTER — Other Ambulatory Visit (HOSPITAL_COMMUNITY): Payer: Self-pay | Admitting: Cardiology

## 2024-10-14 ENCOUNTER — Other Ambulatory Visit (HOSPITAL_COMMUNITY): Payer: Self-pay | Admitting: Family Medicine
